# Patient Record
Sex: Male | Born: 1940 | Race: White | Hispanic: No | Marital: Married | Smoking: Never smoker
Health system: Southern US, Community
[De-identification: ages and names within clinical notes are randomized; demographics above are authoritative.]

## PROBLEM LIST (undated history)

## (undated) DIAGNOSIS — I10 Essential (primary) hypertension: Secondary | ICD-10-CM

## (undated) DIAGNOSIS — E119 Type 2 diabetes mellitus without complications: Secondary | ICD-10-CM

## (undated) DIAGNOSIS — F0390 Unspecified dementia without behavioral disturbance: Secondary | ICD-10-CM

## (undated) DIAGNOSIS — R413 Other amnesia: Secondary | ICD-10-CM

## (undated) DIAGNOSIS — C801 Malignant (primary) neoplasm, unspecified: Secondary | ICD-10-CM

## (undated) HISTORY — PX: HERNIA REPAIR: SHX51

## (undated) HISTORY — PX: BACK SURGERY: SHX140

---

## 2006-07-04 ENCOUNTER — Encounter: Admission: RE | Admit: 2006-07-04 | Discharge: 2006-07-04 | Payer: Self-pay | Admitting: Internal Medicine

## 2006-08-22 ENCOUNTER — Inpatient Hospital Stay (HOSPITAL_COMMUNITY): Admission: RE | Admit: 2006-08-22 | Discharge: 2006-08-23 | Payer: Self-pay | Admitting: Neurosurgery

## 2009-09-20 ENCOUNTER — Emergency Department (HOSPITAL_COMMUNITY): Admission: EM | Admit: 2009-09-20 | Discharge: 2009-09-20 | Payer: Self-pay | Admitting: Emergency Medicine

## 2010-07-12 NOTE — Op Note (Signed)
Patrick Phillips, FIRKUS NO.:  [id number]   MEDICAL RECORD NO.:  [id number]          PATIENT TYPE:  INP   LOCATION:  3172                         FACILITY:  [facility]   PHYSICIAN:  Hilda Lias, M.D.   DATE OF BIRTH:  [birth year]   DATE OF PROCEDURE:  08/22/2006  DATE OF DISCHARGE:                               OPERATIVE REPORT   PREOPERATIVE DIAGNOSIS:  Right L4-L5 and L5-S1 stenosis, herniated disc.   POSTOPERATIVE DIAGNOSES:  Right L4-L5 and L5-S1 stenosis, herniated  disc.   PROCEDURE:  Right L5 hemilaminectomy, right L5-S1 discectomy, right L4-  L5 foraminotomy, microscope.   SURGEON:  Hilda Lias, M.D.   ASSISTANT:  Coletta Memos, M.D.   CLINICAL HISTORY:  The patient was admitted because of back and right  leg.  The pain is getting weaker.  He cannot sleep.  X-rays show  herniated disc at the level of L5-S1 with stenosis at L4-L5.  The  patient wanted to proceed with surgery.   DESCRIPTION OF PROCEDURE:  The patient was taken to the OR and he was  positioned in a prone manner.  The back was cleaned with DuraPrep.  A  midline incision from L4 to L5 was made and the muscle retracted  laterally. We identified easily the L5-S1 and the L4-L5 space.  X-ray  was taken and showed, indeed, we were at the right area. From then on,  because the lamina was quite vertical, we used a Kerrison punch and we  did a hemilaminectomy.  We went to the L4-L5 space and the disc was  bulging but there was no herniated disc.  There was quite a bit of  stenosis in the foramen and foraminotomy to decompress the L5 nerve root  was done.  At the level of L5-S1 we found stenosis, but the patient has  a herniated disc right at the axilla of the nerve.  Retraction was made  and we went to the disc space with removal of two large fragments.  Total gross discectomy was achieved.  At the end, we proceed with  foraminotomy.  The area was irrigated.  Valsalva maneuver was negative.  Depo-Medrol and fentanyl were left in the epidural space and the wound  was closed with Vicryl and Steri-Strips.           ______________________________  Hilda Lias, M.D.     EB/MEDQ  D:  08/22/2006  T:  08/22/2006  Job:  [id number]

## 2010-12-14 LAB — BASIC METABOLIC PANEL
BUN: 17
CO2: 27
Chloride: 106
Glucose, Bld: 123 — ABNORMAL HIGH
Potassium: 4.4

## 2010-12-14 LAB — CBC
HCT: 42.2
MCV: 85.4
Platelets: 240
WBC: 10.8 — ABNORMAL HIGH

## 2013-08-04 ENCOUNTER — Emergency Department (HOSPITAL_COMMUNITY)
Admission: EM | Admit: 2013-08-04 | Discharge: 2013-08-04 | Disposition: A | Discharge status: Home / Self Care | Payer: Medicare Other | Attending: Emergency Medicine | Admitting: Emergency Medicine

## 2013-08-04 ENCOUNTER — Encounter (HOSPITAL_COMMUNITY): Payer: Self-pay | Admitting: Emergency Medicine

## 2013-08-04 DIAGNOSIS — Z85828 Personal history of other malignant neoplasm of skin: Secondary | ICD-10-CM | POA: Diagnosis not present

## 2013-08-04 DIAGNOSIS — R21 Rash and other nonspecific skin eruption: Secondary | ICD-10-CM | POA: Diagnosis present

## 2013-08-04 DIAGNOSIS — IMO0002 Reserved for concepts with insufficient information to code with codable children: Secondary | ICD-10-CM | POA: Diagnosis not present

## 2013-08-04 DIAGNOSIS — S30860A Insect bite (nonvenomous) of lower back and pelvis, initial encounter: Secondary | ICD-10-CM | POA: Diagnosis not present

## 2013-08-04 DIAGNOSIS — Y939 Activity, unspecified: Secondary | ICD-10-CM | POA: Diagnosis not present

## 2013-08-04 DIAGNOSIS — Z9889 Other specified postprocedural states: Secondary | ICD-10-CM | POA: Insufficient documentation

## 2013-08-04 DIAGNOSIS — W57XXXA Bitten or stung by nonvenomous insect and other nonvenomous arthropods, initial encounter: Secondary | ICD-10-CM

## 2013-08-04 DIAGNOSIS — Z792 Long term (current) use of antibiotics: Secondary | ICD-10-CM | POA: Diagnosis not present

## 2013-08-04 DIAGNOSIS — Y929 Unspecified place or not applicable: Secondary | ICD-10-CM | POA: Diagnosis not present

## 2013-08-04 DIAGNOSIS — Z79899 Other long term (current) drug therapy: Secondary | ICD-10-CM | POA: Diagnosis not present

## 2013-08-04 DIAGNOSIS — I10 Essential (primary) hypertension: Secondary | ICD-10-CM | POA: Insufficient documentation

## 2013-08-04 DIAGNOSIS — E119 Type 2 diabetes mellitus without complications: Secondary | ICD-10-CM | POA: Insufficient documentation

## 2013-08-04 HISTORY — DX: Essential (primary) hypertension: I10

## 2013-08-04 HISTORY — DX: Type 2 diabetes mellitus without complications: E11.9

## 2013-08-04 HISTORY — DX: Malignant (primary) neoplasm, unspecified: C80.1

## 2013-08-04 MED ORDER — PREDNISONE 20 MG PO TABS: ORAL_TABLET | ORAL | Status: DC

## 2013-08-04 MED ORDER — DIPHENHYDRAMINE HCL 25 MG PO CAPS
25.0000 mg | ORAL_CAPSULE | Freq: Once | ORAL | Status: AC
  Administered 2013-08-04: 25 mg via ORAL
  Filled 2013-08-04: qty 1

## 2013-08-04 MED ORDER — PREDNISONE 20 MG PO TABS
40.0000 mg | ORAL_TABLET | Freq: Once | ORAL | Status: AC
  Administered 2013-08-04: 40 mg via ORAL
  Filled 2013-08-04: qty 2

## 2013-08-04 MED ORDER — DOXYCYCLINE HYCLATE 100 MG PO CAPS: 100.0000 mg | ORAL_CAPSULE | Freq: Two times a day (BID) | ORAL | Status: DC

## 2013-08-04 NOTE — ED Notes (Signed)
Pt presents to the ED w/ multiple bites to his chest, arm & back. Pt denies ant new food, detergents or clothing. Pt also has swelling to the left axilla also.

## 2013-08-04 NOTE — ED Notes (Signed)
Pt alert & oriented x4, stable gait. Patient given discharge instructions, paperwork & prescription(s). Patient  instructed to stop at the registration desk to finish any additional paperwork. Patient verbalized understanding. Pt left department w/ no further questions. 

## 2013-08-04 NOTE — ED Notes (Signed)
Rash to lt upper chest and lt upper back, itches. Swollen lt axilla

## 2013-08-04 NOTE — ED Provider Notes (Addendum)
73 year old male had a tick bite several days ago. It was removed by a family member but was clearly attached. He has had it she bites in his upper back over the same period of time and has broken out in a rash in the anterior chest. It is moderately paretic. Exam shows definite arthropod bites on his upper back. Erythematous rashes across the anterior chest which is nonspecific in appearance. He will be given prophylaxis for possible [facility] spotted fever, although clearly the rash that he has now is not [facility] spotted fever.  Medical screening examination/treatment/procedure(s) were conducted as a shared visit with non-physician practitioner(s) and myself.  I personally evaluated the patient during the encounter.    Delora Fuel, MD 38/25/05 [id number]  Dejuan Elman, MD 73/41/93 [id number]

## 2013-08-04 NOTE — Discharge Instructions (Signed)
Insect Bite Mosquitoes, flies, fleas, bedbugs, and other insects can bite. Insect bites are different from insect stings. The bite may be red, puffy (swollen), and itchy for 2 to 4 days. Most bites get better on their own. HOME CARE   Do not scratch the bite.  Keep the bite clean and dry. Wash the bite with soap and water.  Put ice on the bite.  Put ice in a plastic bag.  Place a towel between your skin and the bag.  Leave the ice on for 20 minutes, 4 times a day. Do this for the first 2 to 3 days, or as told by your doctor.  You may use medicated lotions or creams to lessen itching as told by your doctor.  Only take medicines as told by your doctor.  If you are given medicines (antibiotics), take them as told. Finish them even if you start to feel better. You may need a tetanus shot if:  You cannot remember when you had your last tetanus shot.  You have never had a tetanus shot.  The injury broke your skin. If you need a tetanus shot and you choose not to have one, you may get tetanus. Sickness from tetanus can be serious. GET HELP RIGHT AWAY IF:   You have more pain, redness, or puffiness.  You see a red line on the skin coming from the bite.  You have a fever.  You have joint pain.  You have a headache or neck pain.  You feel weak.  You have a rash.  You have chest pain, or you are short of breath.  You have belly (abdominal) pain.  You feel sick to your stomach (nauseous) or throw up (vomit).  You feel very tired or sleepy. MAKE SURE YOU:   Understand these instructions.  Will watch your condition.  Will get help right away if you are not doing well or get worse. Document Released: 02/11/2000 Document Revised: 05/08/2011 Document Reviewed: 09/14/2010 [facility] Patient Information 2014 [place], [place].  Tick Bite Information Ticks are insects that attach themselves to the skin and draw blood for food. There are various types of ticks. Common types  include wood ticks and deer ticks. Most ticks live in shrubs and grassy areas. Ticks can climb onto your body when you make contact with leaves or grass where the tick is waiting. The most common places on the body for ticks to attach themselves are the scalp, neck, armpits, waist, and groin. Most tick bites are harmless, but sometimes ticks carry germs that cause diseases. These germs can be spread to a person during the tick's feeding process. The chance of a disease spreading through a tick bite depends on:   The type of tick.  Time of year.   How long the tick is attached.   Geographic location.  HOW CAN YOU PREVENT TICK BITES? Take these steps to help prevent tick bites when you are outdoors:  Wear protective clothing. Long sleeves and long pants are best.   Wear white clothes so you can see ticks more easily.  Tuck your pant legs into your socks.   If walking on a trail, stay in the middle of the trail to avoid brushing against bushes.  Avoid walking through areas with long grass.  Put insect repellent on all exposed skin and along boot tops, pant legs, and sleeve cuffs.   Check clothing, hair, and skin repeatedly and before going inside.   Brush off any ticks that are not  attached.  Take a shower or bath as soon as possible after being outdoors.  WHAT IS THE PROPER WAY TO REMOVE A TICK? Ticks should be removed as soon as possible to help prevent diseases caused by tick bites. 1. If latex gloves are available, put them on before trying to remove a tick.  2. Using fine-point tweezers, grasp the tick as close to the skin as possible. You may also use curved forceps or a tick removal tool. Grasp the tick as close to its head as possible. Avoid grasping the tick on its body. 3. Pull gently with steady upward pressure until the tick lets go. Do not twist the tick or jerk it suddenly. This may break off the tick's head or mouth parts. 4. Do not squeeze or crush the  tick's body. This could force disease-carrying fluids from the tick into your body.  5. After the tick is removed, wash the bite area and your hands with soap and water or other disinfectant such as alcohol. 6. Apply a small amount of antiseptic cream or ointment to the bite site.  7. Wash and disinfect any instruments that were used.  Do not try to remove a tick by applying a hot match, petroleum jelly, or fingernail polish to the tick. These methods do not work and may increase the chances of disease being spread from the tick bite.  WHEN SHOULD YOU SEEK MEDICAL CARE? Contact your health care provider if you are unable to remove a tick from your skin or if a part of the tick breaks off and is stuck in the skin.  After a tick bite, you need to be aware of signs and symptoms that could be related to diseases spread by ticks. Contact your health care provider if you develop any of the following in the days or weeks after the tick bite:  Unexplained fever.  Rash. A circular rash that appears days or weeks after the tick bite may indicate the possibility of Lyme disease. The rash may resemble a target with a bull's-eye and may occur at a different part of your body than the tick bite.  Redness and swelling in the area of the tick bite.   Tender, swollen lymph glands.   Diarrhea.   Weight loss.   Cough.   Fatigue.   Muscle, joint, or bone pain.   Abdominal pain.   Headache.   Lethargy or a change in your level of consciousness.  Difficulty walking or moving your legs.   Numbness in the legs.   Paralysis.  Shortness of breath.   Confusion.   Repeated vomiting.  Document Released: 02/11/2000 Document Revised: 12/04/2012 Document Reviewed: 07/24/2012 [facility] Patient Information 2014 [place].

## 2013-08-07 ENCOUNTER — Encounter (HOSPITAL_COMMUNITY): Payer: Self-pay | Admitting: Emergency Medicine

## 2013-08-07 ENCOUNTER — Emergency Department (HOSPITAL_COMMUNITY)
Admission: EM | Admit: 2013-08-07 | Discharge: 2013-08-07 | Disposition: A | Discharge status: Home / Self Care | Payer: Medicare Other | Attending: Emergency Medicine | Admitting: Emergency Medicine

## 2013-08-07 DIAGNOSIS — Z79899 Other long term (current) drug therapy: Secondary | ICD-10-CM | POA: Diagnosis not present

## 2013-08-07 DIAGNOSIS — E119 Type 2 diabetes mellitus without complications: Secondary | ICD-10-CM | POA: Insufficient documentation

## 2013-08-07 DIAGNOSIS — Z792 Long term (current) use of antibiotics: Secondary | ICD-10-CM | POA: Insufficient documentation

## 2013-08-07 DIAGNOSIS — I10 Essential (primary) hypertension: Secondary | ICD-10-CM | POA: Insufficient documentation

## 2013-08-07 DIAGNOSIS — Z4800 Encounter for change or removal of nonsurgical wound dressing: Secondary | ICD-10-CM | POA: Diagnosis present

## 2013-08-07 DIAGNOSIS — Z85828 Personal history of other malignant neoplasm of skin: Secondary | ICD-10-CM | POA: Diagnosis not present

## 2013-08-07 DIAGNOSIS — B029 Zoster without complications: Secondary | ICD-10-CM | POA: Diagnosis not present

## 2013-08-07 DIAGNOSIS — IMO0002 Reserved for concepts with insufficient information to code with codable children: Secondary | ICD-10-CM | POA: Insufficient documentation

## 2013-08-07 LAB — CBG MONITORING, ED: GLUCOSE-CAPILLARY: 356 mg/dL — AB (ref 70–99)

## 2013-08-07 MED ORDER — OXYCODONE-ACETAMINOPHEN 7.5-325 MG PO TABS: 1.0000 | ORAL_TABLET | ORAL | Status: DC | PRN

## 2013-08-07 MED ORDER — ACYCLOVIR 800 MG PO TABS: 800.0000 mg | ORAL_TABLET | Freq: Every day | ORAL | Status: DC

## 2013-08-07 NOTE — ED Notes (Signed)
Pt states CBG at home 249. Current CBG is 356 in triage.

## 2013-08-07 NOTE — ED Notes (Signed)
MD at bedside. 

## 2013-08-07 NOTE — ED Notes (Signed)
Pt seen here 6/8 for insect bite to chest. States areas have gotten larger. Also states prednisone made his sugar increase to 500's and then stopped the prednisone and doxycycline. States he stopped taking them this morning and threw the medications away. Also states area to back with 4 to 5 bumps from tick removal.

## 2013-08-07 NOTE — ED Provider Notes (Signed)
CSN: [id number]     Arrival date & time 08/07/13  1749 History   First MD Initiated Contact with Patient 08/07/13 1808     Chief Complaint  Patient presents with  . Insect Bite  . Wound Check     (Consider location/radiation/quality/duration/timing/severity/associated sxs/prior Treatment) Patient is a 73 y.o. male presenting with wound check. The history is provided by the patient.  Wound Check This is a new problem. The current episode started in the past 7 days. The problem occurs constantly. The problem has been gradually worsening. Associated symptoms include a rash.   Patrick Phillips is a 73 y.o. male who presents to the ED here for follow up of tick bite from 08/04/2013. He states that he had tick bites to his back and his son took one off by using a knife to cut into the skin to be able to get all the tick out. He states that the rash on his chest has gotten worse and is very painful.  The tick bite is better but the rash around it is worse. The patient states the pain is so severe he did not sleep at all last night and any thing that touches the area causes pain. Also the prednisone he was taking caused his blood sugar to go up so he called the pharmacy today and they told him to stop it.   Past Medical History  Diagnosis Date  . Hypertension   . Diabetes mellitus without complication   . Cancer     skin cancer   Past Surgical History  Procedure Laterality Date  . Hernia repair    . Back surgery     No family history on file. History  Substance Use Topics  . Smoking status: Never Smoker   . Smokeless tobacco: Not on file  . Alcohol Use: No    Review of Systems  Skin: Positive for rash.      Allergies  Review of patient's allergies indicates no known allergies.  Home Medications   Prior to Admission medications   Medication Sig Start Date End Date Taking? Authorizing Provider  doxycycline (VIBRAMYCIN) 100 MG capsule Take 1 capsule (100 mg total) by mouth 2 (two)  times daily. For 14 days 08/04/13   Tammy L. Triplett, PA-C  enalapril (VASOTEC) 20 MG tablet Take 20 mg by mouth every morning.  07/31/13   Historical Provider, MD  metFORMIN (GLUCOPHAGE) 500 MG tablet Take 1,000 mg by mouth 2 (two) times daily. 07/31/13   Historical Provider, MD  predniSONE (DELTASONE) 20 MG tablet Two tabs po qd x 4 days 08/04/13   Tammy L. Triplett, PA-C   BP 132/62  Pulse 68  Temp(Src) 97.5 F (36.4 C) (Oral)  Resp 18  Wt 193 lb (87.544 kg)  SpO2 98% Physical Exam  Nursing note and vitals reviewed. Constitutional: He is oriented to person, place, and time. He appears well-developed and well-nourished. No distress.  HENT:  Head: Normocephalic.  Eyes: Conjunctivae and EOM are normal.  Neck: Normal range of motion. Neck supple.  Cardiovascular: Normal rate.   Pulmonary/Chest: Effort normal.      vesicular rash noted to left chest wall and left upper back. The rash is painful.  Musculoskeletal: Normal range of motion.  Neurological: He is alert and oriented to person, place, and time. No cranial nerve deficit.  Skin: Skin is warm and dry.  Psychiatric: He has a normal mood and affect. His behavior is normal.    ED Course  Procedures (including  critical care time)  Dr. Roderic Palau in to examine the patient and agrees with assessment and plan.  Labs Review Labs Reviewed  CBG MONITORING, ED - Abnormal; Notable for the following:    Glucose-Capillary 356 (*)    All other components within normal limits    MDM  73 y.o. male with rash to upper left chest and back consistent with Zoster. Will treat with Zovirax and pain medication. Stable for discharge. He will return for any problems. Healing wound from tick bite on back. Discussed with the patient and his family clinical findings and plan of care. All questioned fully answered. He will keep close check on his blood sugar.    Medication List    TAKE these medications       acyclovir 800 MG tablet  Commonly known as:   ZOVIRAX  Take 1 tablet (800 mg total) by mouth 5 (five) times daily.     oxyCODONE-acetaminophen 7.5-325 MG per tablet  Commonly known as:  PERCOCET  Take 1 tablet by mouth every 4 (four) hours as needed for pain.      ASK your doctor about these medications       doxycycline 100 MG capsule  Commonly known as:  VIBRAMYCIN  Take 1 capsule (100 mg total) by mouth 2 (two) times daily. For 14 days     enalapril 20 MG tablet  Commonly known as:  VASOTEC  Take 20 mg by mouth every morning.     metFORMIN 500 MG tablet  Commonly known as:  GLUCOPHAGE  Take 1,000 mg by mouth 2 (two) times daily.     predniSONE 20 MG tablet  Commonly known as:  DELTASONE  Two tabs po qd x 4 days           [facility], NP 08/07/13 2303

## 2013-08-07 NOTE — ED Notes (Signed)
Pt verbalized understanding to use caution and no driving within 4 hours of taking Percocet due to med causes drowsiness, pt verbalized understanding that percocet can cause constipation as well

## 2013-08-07 NOTE — Discharge Instructions (Signed)
Do not take the narcotic if you are driving as it will make you sleepy. Return as needed Shingles Shingles (herpes zoster) is an infection that is caused by the same virus that causes chickenpox (varicella). The infection causes a painful skin rash and fluid-filled blisters, which eventually break open, crust over, and heal. It may occur in any area of the body, but it usually affects only one side of the body or face. The pain of shingles usually lasts about 1 month. However, some people with shingles may develop long-term (chronic) pain in the affected area of the body. Shingles often occurs many years after the person had chickenpox. It is more common:  In people older than 50 years.  In people with weakened immune systems, such as those with HIV, AIDS, or cancer.  In people taking medicines that weaken the immune system, such as transplant medicines.  In people under great stress. CAUSES  Shingles is caused by the varicella zoster virus (VZV), which also causes chickenpox. After a person is infected with the virus, it can remain in the person's body for years in an inactive state (dormant). To cause shingles, the virus reactivates and breaks out as an infection in a nerve root. The virus can be spread from person to person (contagious) through contact with open blisters of the shingles rash. It will only spread to people who have not had chickenpox. When these people are exposed to the virus, they may develop chickenpox. They will not develop shingles. Once the blisters scab over, the person is no longer contagious and cannot spread the virus to others. SYMPTOMS  Shingles shows up in stages. The initial symptoms may be pain, itching, and tingling in an area of the skin. This pain is usually described as burning, stabbing, or throbbing.In a few days or weeks, a painful red rash will appear in the area where the pain, itching, and tingling were felt. The rash is usually on one side of the body in  a band or belt-like pattern. Then, the rash usually turns into fluid-filled blisters. They will scab over and dry up in approximately 2 3 weeks. Flu-like symptoms may also occur with the initial symptoms, the rash, or the blisters. These may include:  Fever.  Chills.  Headache.  Upset stomach. DIAGNOSIS  Your caregiver will perform a skin exam to diagnose shingles. Skin scrapings or fluid samples may also be taken from the blisters. This sample will be examined under a microscope or sent to a lab for further testing. TREATMENT  There is no specific cure for shingles. Your caregiver will likely prescribe medicines to help you manage the pain, recover faster, and avoid long-term problems. This may include antiviral drugs, anti-inflammatory drugs, and pain medicines. HOME CARE INSTRUCTIONS   Take a cool bath or apply cool compresses to the area of the rash or blisters as directed. This may help with the pain and itching.   Only take over-the-counter or prescription medicines as directed by your caregiver.   Rest as directed by your caregiver.  Keep your rash and blisters clean with mild soap and cool water or as directed by your caregiver.  Do not pick your blisters or scratch your rash. Apply an anti-itch cream or numbing creams to the affected area as directed by your caregiver.  Keep your shingles rash covered with a loose bandage (dressing).  Avoid skin contact with:  Babies.   Pregnant women.   Children with eczema.   Elderly people with transplants.  People with chronic illnesses, such as leukemia or AIDS.   Wear loose-fitting clothing to help ease the pain of material rubbing against the rash.  Keep all follow-up appointments with your caregiver.If the area involved is on your face, you may receive a referral for follow-up to a specialist, such as an eye doctor (ophthalmologist) or an ear, nose, and throat (ENT) doctor. Keeping all follow-up appointments will  help you avoid eye complications, chronic pain, or disability.  SEEK IMMEDIATE MEDICAL CARE IF:   You have facial pain, pain around the eye area, or loss of feeling on one side of your face.  You have ear pain or ringing in your ear.  You have loss of taste.  Your pain is not relieved with prescribed medicines.   Your redness or swelling spreads.   You have more pain and swelling.  Your condition is worsening or has changed.   You have a feveror persistent symptoms for more than 2 3 days.  You have a fever and your symptoms suddenly get worse. MAKE SURE YOU:  Understand these instructions.  Will watch your condition.  Will get help right away if you are not doing well or get worse. Document Released: 02/13/2005 Document Revised: 11/08/2011 Document Reviewed: 09/28/2011 [facility] Patient Information 2014 [place].

## 2013-08-11 NOTE — ED Provider Notes (Signed)
Medical screening examination/treatment/procedure(s) were performed by non-physician practitioner and as supervising physician I was immediately available for consultation/collaboration.   EKG Interpretation None        Carvin Almas L Cleon Thoma, MD 08/11/13 0802 

## 2013-08-19 NOTE — ED Provider Notes (Signed)
CSN: [id number]     Arrival date & time 08/04/13  2127 History   First MD Initiated Contact with Patient 08/04/13 2310     Chief Complaint  Patient presents with  . Rash     (Consider location/radiation/quality/duration/timing/severity/associated sxs/prior Treatment) Patient is a 73 y.o. male presenting with rash. The history is provided by the patient.  Rash Location:  Torso Torso rash location:  Upper back and L chest Quality: itchiness and redness   Quality: not blistering, not draining, not painful, not swelling and not weeping   Severity:  Moderate Onset quality:  Gradual Duration:  3 days Timing:  Constant Chronicity:  New Context comment:  Tick bite Relieved by:  Nothing Worsened by:  Nothing tried Ineffective treatments:  None tried Associated symptoms: no fatigue, no fever, no headaches, no joint pain, no myalgias, no nausea, no periorbital edema, no shortness of breath, no sore throat, no throat swelling, no tongue swelling, not vomiting and not wheezing    Patient states his son removed a tick from his left upper back several days ago.  He states the tick was embedded in the skin and was removed by scraping the skin with a knife.  Pt reports that the tick was completely removed.  He noticed a itchy red rash begin to develop surrounding the tick bite and extending to the left upper chest.  He denies pain, swelling, joint pains, fever, headaches or vomiting.  Nothing makes the symptoms better or worse.     Past Medical History  Diagnosis Date  . Hypertension   . Diabetes mellitus without complication   . Cancer     skin cancer   Past Surgical History  Procedure Laterality Date  . Hernia repair    . Back surgery     History reviewed. No pertinent family history. History  Substance Use Topics  . Smoking status: Never Smoker   . Smokeless tobacco: Not on file  . Alcohol Use: No    Review of Systems  Constitutional: Negative for fever, chills, activity change,  appetite change and fatigue.  HENT: Negative for facial swelling, sore throat and trouble swallowing.   Respiratory: Negative for chest tightness, shortness of breath and wheezing.   Gastrointestinal: Negative for nausea and vomiting.  Musculoskeletal: Negative for arthralgias, back pain, joint swelling, myalgias, neck pain and neck stiffness.  Skin: Positive for color change and rash. Negative for wound.  Neurological: Negative for dizziness, weakness, numbness and headaches.  All other systems reviewed and are negative.     Allergies  Review of patient's allergies indicates no known allergies.  Home Medications   Prior to Admission medications   Medication Sig Start Date End Date Taking? Authorizing Provider  enalapril (VASOTEC) 20 MG tablet Take 20 mg by mouth every morning.  07/31/13  Yes Historical Provider, MD  metFORMIN (GLUCOPHAGE) 500 MG tablet Take 1,000 mg by mouth 2 (two) times daily. 07/31/13  Yes Historical Provider, MD  acyclovir (ZOVIRAX) 800 MG tablet Take 1 tablet (800 mg total) by mouth 5 (five) times daily. 08/07/13   Hope Bunnie Pion, NP  doxycycline (VIBRAMYCIN) 100 MG capsule Take 1 capsule (100 mg total) by mouth 2 (two) times daily. For 14 days 08/04/13   Santana Gosdin L. Shawne Eskelson, PA-C  oxyCODONE-acetaminophen (PERCOCET) 7.5-325 MG per tablet Take 1 tablet by mouth every 4 (four) hours as needed for pain. 08/07/13   Hope Bunnie Pion, NP  predniSONE (DELTASONE) 20 MG tablet Two tabs po qd x 4 days 08/04/13  Robley Matassa L. Kelsey Edman, PA-C   BP 165/80  Pulse 63  Temp(Src) 98.6 F (37 C) (Oral)  Resp 20  Ht 6' (1.829 m)  Wt 193 lb (87.544 kg)  BMI 26.17 kg/m2  SpO2 95% Physical Exam  Nursing note and vitals reviewed. Constitutional: He is oriented to person, place, and time. He appears well-developed and well-nourished. No distress.  HENT:  Head: Normocephalic and atraumatic.  Mouth/Throat: Oropharynx is clear and moist.  Neck: Normal range of motion. Neck supple.  Cardiovascular:  Normal rate, regular rhythm, normal heart sounds and intact distal pulses.   No murmur heard. Pulmonary/Chest: Effort normal and breath sounds normal. No respiratory distress. He exhibits no tenderness.  Musculoskeletal: Normal range of motion. He exhibits no edema and no tenderness.  Lymphadenopathy:    He has no cervical adenopathy.  Neurological: He is alert and oriented to person, place, and time. He exhibits normal muscle tone. Coordination normal.  Skin: Skin is warm. Rash noted. There is erythema.  Scattered erythematous papules to the left upper back at the reported site of tick bites.  No tick parts remain. No vesicles or lesions with central clearing.  Few smaller, nonspecific papules to the left upper chest.      ED Course  Procedures (including critical care time) Labs Review Labs Reviewed - No data to display  Imaging Review No results found.   EKG Interpretation None      MDM   Final diagnoses:  Insect bite  Tick bite    Pt is well appearing, non-toxic.  Hx of recent tick bites to the upper back and associated rash.  No reported fever, arthralgias, or myalgias.  Rash appears non specific.  Will treat pt with doxycycline and prednisone.  He agrees to return here if any worsening symptoms or f/u with PMD.    Pt also seen by Dr. Roxanne Mins and care plan discussed.  Pt appears stable for d/c and agrees to plan.    Costas Sena L. Vanessa Braceville, PA-C 08/19/13 1242

## 2013-08-19 NOTE — ED Provider Notes (Deleted)
Medical screening examination/treatment/procedure(s) were performed by non-physician practitioner and as supervising physician I was immediately available for consultation/collaboration.    Delora Fuel, MD 98/33/82 [id number]

## 2013-10-16 ENCOUNTER — Encounter (HOSPITAL_COMMUNITY): Payer: Self-pay | Admitting: Emergency Medicine

## 2013-10-16 ENCOUNTER — Observation Stay (HOSPITAL_COMMUNITY)
Admission: EM | Admit: 2013-10-16 | Discharge: 2013-10-17 | Disposition: A | Discharge status: Home / Self Care | Payer: Medicare Other | Attending: Emergency Medicine | Admitting: Emergency Medicine

## 2013-10-16 DIAGNOSIS — E119 Type 2 diabetes mellitus without complications: Secondary | ICD-10-CM | POA: Insufficient documentation

## 2013-10-16 DIAGNOSIS — R0602 Shortness of breath: Secondary | ICD-10-CM | POA: Diagnosis not present

## 2013-10-16 DIAGNOSIS — Z79899 Other long term (current) drug therapy: Secondary | ICD-10-CM | POA: Diagnosis not present

## 2013-10-16 DIAGNOSIS — R55 Syncope and collapse: Principal | ICD-10-CM | POA: Insufficient documentation

## 2013-10-16 DIAGNOSIS — R42 Dizziness and giddiness: Secondary | ICD-10-CM | POA: Diagnosis not present

## 2013-10-16 DIAGNOSIS — R404 Transient alteration of awareness: Secondary | ICD-10-CM | POA: Diagnosis present

## 2013-10-16 DIAGNOSIS — Z85828 Personal history of other malignant neoplasm of skin: Secondary | ICD-10-CM | POA: Insufficient documentation

## 2013-10-16 DIAGNOSIS — I498 Other specified cardiac arrhythmias: Secondary | ICD-10-CM | POA: Insufficient documentation

## 2013-10-16 DIAGNOSIS — I10 Essential (primary) hypertension: Secondary | ICD-10-CM | POA: Diagnosis not present

## 2013-10-16 DIAGNOSIS — R110 Nausea: Secondary | ICD-10-CM | POA: Insufficient documentation

## 2013-10-16 DIAGNOSIS — R109 Unspecified abdominal pain: Secondary | ICD-10-CM | POA: Diagnosis not present

## 2013-10-16 DIAGNOSIS — R5383 Other fatigue: Secondary | ICD-10-CM

## 2013-10-16 DIAGNOSIS — M2560 Stiffness of unspecified joint, not elsewhere classified: Secondary | ICD-10-CM

## 2013-10-16 DIAGNOSIS — R5381 Other malaise: Secondary | ICD-10-CM | POA: Diagnosis not present

## 2013-10-16 DIAGNOSIS — M2569 Stiffness of other specified joint, not elsewhere classified: Secondary | ICD-10-CM | POA: Insufficient documentation

## 2013-10-16 DIAGNOSIS — R001 Bradycardia, unspecified: Secondary | ICD-10-CM

## 2013-10-16 NOTE — ED Notes (Signed)
Pt c/o sob with abd tightness and dizziness. Pt states he had syncopal episode at home in bathroom.

## 2013-10-17 ENCOUNTER — Emergency Department (HOSPITAL_COMMUNITY): Payer: Medicare Other

## 2013-10-17 ENCOUNTER — Encounter (HOSPITAL_COMMUNITY): Payer: Self-pay | Admitting: *Deleted

## 2013-10-17 DIAGNOSIS — R001 Bradycardia, unspecified: Secondary | ICD-10-CM | POA: Diagnosis present

## 2013-10-17 DIAGNOSIS — R55 Syncope and collapse: Secondary | ICD-10-CM | POA: Insufficient documentation

## 2013-10-17 DIAGNOSIS — I10 Essential (primary) hypertension: Secondary | ICD-10-CM

## 2013-10-17 DIAGNOSIS — E119 Type 2 diabetes mellitus without complications: Secondary | ICD-10-CM | POA: Diagnosis present

## 2013-10-17 DIAGNOSIS — R0602 Shortness of breath: Secondary | ICD-10-CM | POA: Diagnosis present

## 2013-10-17 DIAGNOSIS — I498 Other specified cardiac arrhythmias: Secondary | ICD-10-CM

## 2013-10-17 LAB — CBC
HCT: 37.2 % — ABNORMAL LOW (ref 39.0–52.0)
Hemoglobin: 12.6 g/dL — ABNORMAL LOW (ref 13.0–17.0)
MCH: 29.4 pg (ref 26.0–34.0)
MCHC: 33.9 g/dL (ref 30.0–36.0)
MCV: 86.7 fL (ref 78.0–100.0)
PLATELETS: 181 10*3/uL (ref 150–400)
RBC: 4.29 MIL/uL (ref 4.22–5.81)
RDW: 13.3 % (ref 11.5–15.5)
WBC: 9.5 10*3/uL (ref 4.0–10.5)

## 2013-10-17 LAB — CBC WITH DIFFERENTIAL/PLATELET
BASOS PCT: 0 % (ref 0–1)
Basophils Absolute: 0 10*3/uL (ref 0.0–0.1)
EOS ABS: 0.4 10*3/uL (ref 0.0–0.7)
EOS PCT: 4 % (ref 0–5)
HEMATOCRIT: 39.6 % (ref 39.0–52.0)
HEMOGLOBIN: 13.3 g/dL (ref 13.0–17.0)
Lymphocytes Relative: 44 % (ref 12–46)
Lymphs Abs: 4.4 10*3/uL — ABNORMAL HIGH (ref 0.7–4.0)
MCH: 29 pg (ref 26.0–34.0)
MCHC: 33.6 g/dL (ref 30.0–36.0)
MCV: 86.5 fL (ref 78.0–100.0)
MONO ABS: 0.6 10*3/uL (ref 0.1–1.0)
MONOS PCT: 6 % (ref 3–12)
NEUTROS PCT: 46 % (ref 43–77)
Neutro Abs: 4.6 10*3/uL (ref 1.7–7.7)
Platelets: 208 10*3/uL (ref 150–400)
RBC: 4.58 MIL/uL (ref 4.22–5.81)
RDW: 13.2 % (ref 11.5–15.5)
WBC: 10 10*3/uL (ref 4.0–10.5)

## 2013-10-17 LAB — BASIC METABOLIC PANEL
ANION GAP: 13 (ref 5–15)
Anion gap: 13 (ref 5–15)
BUN: 23 mg/dL (ref 6–23)
BUN: 22 mg/dL (ref 6–23)
CHLORIDE: 104 meq/L (ref 96–112)
CO2: 23 mEq/L (ref 19–32)
CO2: 23 meq/L (ref 19–32)
CREATININE: 1.02 mg/dL (ref 0.50–1.35)
Calcium: 9.7 mg/dL (ref 8.4–10.5)
Calcium: 9.2 mg/dL (ref 8.4–10.5)
Chloride: 104 mEq/L (ref 96–112)
Creatinine, Ser: 0.97 mg/dL (ref 0.50–1.35)
GFR calc non Af Amer: 71 mL/min — ABNORMAL LOW (ref >90)
GFR, EST AFRICAN AMERICAN: 83 mL/min — AB (ref >90)
GFR, EST NON AFRICAN AMERICAN: 81 mL/min — AB (ref >90)
GLUCOSE: 115 mg/dL — AB (ref 70–99)
Glucose, Bld: 147 mg/dL — ABNORMAL HIGH (ref 70–99)
POTASSIUM: 4.4 meq/L (ref 3.7–5.3)
Potassium: 4.2 mEq/L (ref 3.7–5.3)
SODIUM: 140 meq/L (ref 137–147)
Sodium: 140 mEq/L (ref 137–147)

## 2013-10-17 LAB — CBG MONITORING, ED: Glucose-Capillary: 115 mg/dL — ABNORMAL HIGH (ref 70–99)

## 2013-10-17 LAB — I-STAT TROPONIN, ED: Troponin i, poc: 0 ng/mL (ref 0.00–0.08)

## 2013-10-17 LAB — D-DIMER, QUANTITATIVE: D-Dimer, Quant: 0.61 ug/mL-FEU — ABNORMAL HIGH (ref 0.00–0.48)

## 2013-10-17 LAB — TROPONIN I: Troponin I: <0.3 ng/mL (ref <0.30)

## 2013-10-17 LAB — GLUCOSE, CAPILLARY: Glucose-Capillary: 125 mg/dL — ABNORMAL HIGH (ref 70–99)

## 2013-10-17 MED ORDER — SODIUM CHLORIDE 0.9 % IJ SOLN
3.0000 mL | INTRAMUSCULAR | Status: DC | PRN
  Administered 2013-10-17: 3 mL via INTRAVENOUS

## 2013-10-17 MED ORDER — SODIUM CHLORIDE 0.9 % IJ SOLN: 3.0000 mL | Freq: Two times a day (BID) | INTRAMUSCULAR | Status: DC

## 2013-10-17 MED ORDER — SODIUM CHLORIDE 0.9 % IV SOLN: 250.0000 mL | INTRAVENOUS | Status: DC | PRN

## 2013-10-17 MED ORDER — ACETAMINOPHEN 325 MG PO TABS
650.0000 mg | ORAL_TABLET | Freq: Four times a day (QID) | ORAL | Status: DC | PRN
  Administered 2013-10-17: 650 mg via ORAL
  Filled 2013-10-17: qty 2

## 2013-10-17 MED ORDER — LORAZEPAM 1 MG PO TABS
1.0000 mg | ORAL_TABLET | Freq: Once | ORAL | Status: AC
  Administered 2013-10-17: 1 mg via ORAL
  Filled 2013-10-17: qty 1

## 2013-10-17 NOTE — Discharge Summary (Signed)
Physician Discharge Summary  Dearborn [id number] DOB: 11/21/1940 DOA: 10/16/2013  PCP: Cleda Mccreedy, MD  Admit date: 10/16/2013 Discharge date: 10/17/2013  Recommendations for Outpatient Follow-up:  1. Shortness of breath. See discussion below.   Follow-up Information   Follow up with Cleda Mccreedy, MD. Schedule an appointment as soon as possible for a visit in 1 week.   Specialty:  Internal Medicine   Contact information:   [address] [address] [phone]      Discharge Diagnoses:  1. Shortness of breath, resolved. 2. Diabetes mellitus type 2. 3. Hypertension.  Discharge Condition: Improved Disposition: Home  Diet recommendation: Heart healthy diabetic diet  Filed Weights   10/16/13 2356 10/17/13 0240  Weight: 87.544 kg (193 lb) 87.544 kg (193 lb)    History of present illness:  73 year old woman with history of diabetes, hypertension who developed shortness of breath 8/20 in the evening after eating a large meal and watermelon. He believes his shortness of breath was secondary to distention of his abdomen and improved with compression of his abdomen with his arms. Shortness of breath resolved after presenting to the emergency department. Although syncope documented by EDP, the patient reports no syncope. He got up in the middle of the night, felt generally weak and needed to sit down but never passed out. He was admitted for further evaluation of shortness of breath.  Hospital Course:  Mr. Poole had no recurrent shortness of breath. Troponin was negative, telemetry unremarkable. No signs or symptoms to suggest ACS or VTE. Hospitalization was uncomplicated and he is back to baseline and desires discharge home. No further evaluation is suggested at this time. Individual issues as below.  1. Shortness of breath. Resolved. He does have a history of bronchitis but was never a smoker. Chest x-ray with no acute abnormalities, respiratory rate normal,  no significant hypoxia. No history to suggest, or evidence of, ACS. No history or clinical evidence to suggest CHF. He has no risk factors or history of VTE, Wells score = 0, he never had chest pain. I agree with EDP documentation that PE/VTE is not suspected. I discussed positive ddimer with him but recommend no further evaluation at this point. I concur with patient's assessment this is likely related to abdominal distention after overeating. Mild sinus bradycardia with no significant arrhythmias noted. 2. DM. Stable. 3. HTN. Stable. 4. Grief, anxiety for wife who died unexpectedly 2013-03-05  Consultants:  none Procedures:  none  Discharge Instructions  Discharge Instructions   Activity as tolerated - No restrictions    Complete by:  As directed      Diet - low sodium heart healthy    Complete by:  As directed      Diet Carb Modified    Complete by:  As directed      Discharge instructions    Complete by:  As directed   Call physician or seek immediate medical assistance for recurrent shortness of breath, chest pain, generalized weakness, difficulty walking or worsening of condition.            Medication List         acyclovir 800 MG tablet  Commonly known as:  ZOVIRAX  Take 1 tablet (800 mg total) by mouth 5 (five) times daily.     doxycycline 100 MG capsule  Commonly known as:  VIBRAMYCIN  Take 1 capsule (100 mg total) by mouth 2 (two) times daily. For 14 days     enalapril 20 MG  tablet  Commonly known as:  VASOTEC  Take 20 mg by mouth every morning.     metFORMIN 500 MG tablet  Commonly known as:  GLUCOPHAGE  Take 1,000 mg by mouth 2 (two) times daily.     oxyCODONE-acetaminophen 7.5-325 MG per tablet  Commonly known as:  PERCOCET  Take 1 tablet by mouth every 4 (four) hours as needed for pain.     predniSONE 20 MG tablet  Commonly known as:  DELTASONE  Two tabs po qd x 4 days       No Known Allergies  The results of significant diagnostics from this  hospitalization (including imaging, microbiology, ancillary and laboratory) are listed below for reference.    Significant Diagnostic Studies: Dg Chest Portable 1 View  10/17/2013   CLINICAL DATA:  Shortness of breath, hypertension.  EXAM: PORTABLE CHEST - 1 VIEW  COMPARISON:  Chest radiograph August 21, 2006  FINDINGS: Cardiomediastinal silhouette is unremarkable. The lungs are clear without pleural effusions or focal consolidations. Trachea projects midline and there is no pneumothorax. Soft tissue planes and included osseous structures are non-suspicious.  IMPRESSION: No acute cardiopulmonary process.   Electronically Signed   By: Elon Alas   On: 10/17/2013 00:33   Labs: Basic Metabolic Panel:  Recent Labs Lab 10/17/13 0038 10/17/13 0348  NA 140 140  K 4.4 4.2  CL 104 104  CO2 23 23  GLUCOSE 115* 147*  BUN 23 22  CREATININE 1.02 0.97  CALCIUM 9.7 9.2   CBC:  Recent Labs Lab 10/17/13 0038 10/17/13 0348  WBC 10.0 9.5  NEUTROABS 4.6  --   HGB 13.3 12.6*  HCT 39.6 37.2*  MCV 86.5 86.7  PLT 208 181   Cardiac Enzymes:  Recent Labs Lab 10/17/13 0348  TROPONINI <0.30   CBG:  Recent Labs Lab 10/17/13 0023 10/17/13 0316  GLUCAP 115* 125*    Principal Problem:   SOB (shortness of breath) Active Problems:   Hypertension   Diabetes mellitus without complication   Sinus bradycardia   Time coordinating discharge: 35 minutes  Signed:  Murray Hodgkins, MD [facility] 10/17/2013, 9:47 AM

## 2013-10-17 NOTE — Progress Notes (Signed)
PROGRESS NOTE  Patrick Phillips [id number] DOB: 1940/02/28 DOA: 10/16/2013 PCP: Cleda Mccreedy, MD  Summary: 73 year old woman with history of diabetes, hypertension who developed shortness of breath 8/20 in the evening after eating a large meal and watermelon. He believes his shortness of breath was secondary to distention of his abdomen and improved with compression of his abdomen with his arms. Shortness of breath resolved after presenting to the emergency department. Although syncope documented by EDP, the patient reports no syncope. He got up in the middle of the night, felt generally weak and needed to sit down but never passed out. He was admitted for further evaluation of shortness of breath.  Assessment/Plan: 1. Shortness of breath. Resolved. He does have a history of bronchitis but was never a smoker. Chest x-ray with no acute abnormalities, respiratory rate normal, no significant hypoxia. No history to suggest, or evidence of, ACS. No history or clinical evidence to suggest CHF. He has no risk factors or history of VTE, Wells score = 0, he never had chest pain. I agree with EDP documentation that PE/VTE is not suspected. I discussed positive ddimer with him but recommend no further evaluation at this point. I concur with patient's assessment this is likely related to abdominal distention after overeating. Mild sinus bradycardia with no significant arrhythmias noted. 2. DM. Stable. 3. HTN. Stable. 4. Grief, anxiety for wife who died unexpectedly March 29, 2013   Shortness of breath has resolved. No further evaluation suggested at this time. Plan discharge home today.  Discussed in detail with 2 daughters, 2 granddaughters at bedside. I reviewed all testing with them and recommendations.  Murray Hodgkins, MD  [facility]  Pager [phone] If 7PM-7AM, please contact night-coverage at [email or web address], password [facility] 10/17/2013, 8:59 AM  LOS: 1 day    Consultants:  none  Procedures:  none  Antibiotics:    HPI/Subjective: Feels good, no complaints. Breathing normally. No chest pain. No recent surgery, swelling. He reports upon further reflection he thinks this was secondary to over heating. He developed some shortness of breath after eating a large meal and watermelon last evening prior to going to sleep. He does have intermittent episodes of shortness of breath. He again reports no syncope at home.  Objective: Filed Vitals:   10/17/13 0240 10/17/13 0244 10/17/13 0559 10/17/13 0738  BP:  142/68    Pulse:  50    Temp:  98.7 F (37.1 C) 98 F (36.7 C)   TempSrc:  Oral Oral   Resp:  20 20   Height: 6' (1.829 m)     Weight: 87.544 kg (193 lb)     SpO2:   100% 100%   No intake or output data in the 24 hours ending 10/17/13 0859   Filed Weights   10/16/13 2356 10/17/13 0240  Weight: 87.544 kg (193 lb) 87.544 kg (193 lb)    Exam:     Afebrile, vitals stable. Heart rate 50-60s. Normotensive. Gen. Appears calm and comfortable.  Psych. Alert. Speech fluent and clear.  Eyes. Grossly unremarkable  Cardiovascular. Regular rate and rhythm. No murmur, rub or gallop. No lower extremity edema. Telemetry sinus rhythm, sinus bradycardia. Heart rate 50 to 60s.  Respiratory. Clear to auscultation bilaterally. No wheezes, rales or rhonchi. Normal respiratory effort.  Skin. Limited exam grossly unremarkable  Musculoskeletal. Moves all extremities  Data Reviewed:  Basic metabolic panel unremarkable. Troponin negative.  CBC unremarkable.  D-dimer modestly elevated.  Chest x-ray unremarkable. EKG sinus bradycardia. No acute changes.  Scheduled Meds: . sodium  chloride  3 mL Intravenous Q12H  . sodium chloride  3 mL Intravenous Q12H   Continuous Infusions:   Principal Problem:   SOB (shortness of breath) Active Problems:   Hypertension   Diabetes mellitus without complication   Sinus  bradycardia

## 2013-10-17 NOTE — ED Provider Notes (Signed)
CSN: [id number]     Arrival date & time 10/16/13  2347 History   First MD Initiated Contact with Patient 10/17/13 0001    This chart was scribed for Sharyon Cable, MD by Terressa Koyanagi, ED Scribe. This patient was seen in room APA14/AP[facility]14 and the patient's care was started at 12:05 AM.  Chief Complaint  Patient presents with  . Loss of Consciousness   The history is provided by the patient. No language interpreter was used.   PCP: Cleda Mccreedy, MD  HPI Comments: Patrick Phillips is a 73 y.o. male who presents to the Emergency Department complaining of syncopal episode at home this evening. Pt suspects the episode lasted approximately 25 minutes. Pt states that he "could not breathe" prior to loss of consciousness and his neck became stiff. Pt denies any head trauma. Pt denies any Hx of similar episode. Pt also complains of associated abd tightness, weakness in the lower extremities, and dizziness. Pt notes that he has been in bed most of the day today as he has not been feeling well. .Pt denies any severe HAs today, chest pain, LE swelling.    Past Medical History  Diagnosis Date  . Hypertension   . Diabetes mellitus without complication   . Cancer     skin cancer   Past Surgical History  Procedure Laterality Date  . Hernia repair    . Back surgery     History reviewed. No pertinent family history. History  Substance Use Topics  . Smoking status: Never Smoker   . Smokeless tobacco: Not on file  . Alcohol Use: Yes    Review of Systems  Constitutional: Negative for fever and chills.  HENT:       Neck stiffness  Respiratory: Positive for shortness of breath.   Gastrointestinal: Positive for nausea and abdominal pain (abd tightness).  Neurological: Positive for dizziness, syncope and weakness (lower extremities ).  All other systems reviewed and are negative.  Allergies  Review of patient's allergies indicates no known allergies.  Home Medications   Prior to Admission  medications   Medication Sig Start Date End Date Taking? Authorizing Provider  enalapril (VASOTEC) 20 MG tablet Take 20 mg by mouth every morning.  07/31/13  Yes Historical Provider, MD  metFORMIN (GLUCOPHAGE) 500 MG tablet Take 1,000 mg by mouth 2 (two) times daily. 07/31/13  Yes Historical Provider, MD  acyclovir (ZOVIRAX) 800 MG tablet Take 1 tablet (800 mg total) by mouth 5 (five) times daily. 08/07/13   Hope Bunnie Pion, NP  doxycycline (VIBRAMYCIN) 100 MG capsule Take 1 capsule (100 mg total) by mouth 2 (two) times daily. For 14 days 08/04/13   Tammy L. Triplett, PA-C  oxyCODONE-acetaminophen (PERCOCET) 7.5-325 MG per tablet Take 1 tablet by mouth every 4 (four) hours as needed for pain. 08/07/13   Hope Bunnie Pion, NP  predniSONE (DELTASONE) 20 MG tablet Two tabs po qd x 4 days 08/04/13   Tammy L. Triplett, PA-C   Triage Vitals: BP 165/63  Pulse 56  Temp(Src) 98.2 F (36.8 C)  Resp 18  Ht 6' (1.829 m)  Wt 193 lb (87.544 kg)  BMI 26.17 kg/m2  SpO2 98% Physical Exam CONSTITUTIONAL: Well developed/well nourished HEAD: Normocephalic/atraumatic EYES: EOMI/PERRL ENMT: Mucous membranes moist, uvula midline no stridor.  NECK: supple no meningeal signs SPINE:entire spine nontender CV: S1/S2 noted, no murmurs/rubs/gallops noted LUNGS: Lungs are clear to auscultation bilaterally, no apparent distress ABDOMEN: soft, nontender, no rebound or guarding GU:no cva tenderness NEURO: Pt  is awake/alert, moves all extremitiesx4, no arm/leg drift noted EXTREMITIES: pulses normal, full ROM SKIN: warm, color normal PSYCH: no abnormalities of mood noted  ED Course  Procedures  DIAGNOSTIC STUDIES: Oxygen Saturation is 98% on RA, nl by my interpretation.    COORDINATION OF CARE: 12:12 AM-Discussed treatment plan which includes admitting pt to hospital, imaging, EKG, and labs with pt at bedside and pt agreed to plan.   2:22 AM Pt with syncopal episode at home, now back to baseline Denies CP  And no hypoxia, I  doubt PE at this time He has no signs of head injury and denies significant HA, defer CT head However given age/history of prolonged loss of consciousness, will admit D/w Dr Shanon Brow with [facility] will [facility]   Labs Review Labs Reviewed  BASIC METABOLIC PANEL - Abnormal; Notable for the following:    Glucose, Bld 115 (*)    GFR calc non Af Amer 71 (*)    GFR calc Af Amer 83 (*)    All other components within normal limits  CBC WITH DIFFERENTIAL - Abnormal; Notable for the following:    Lymphs Abs 4.4 (*)    All other components within normal limits  CBG MONITORING, ED - Abnormal; Notable for the following:    Glucose-Capillary 115 (*)    All other components within normal limits  I-STAT TROPOININ, ED    Imaging Review Dg Chest Portable 1 View  10/17/2013   CLINICAL DATA:  Shortness of breath, hypertension.  EXAM: PORTABLE CHEST - 1 VIEW  COMPARISON:  Chest radiograph August 21, 2006  FINDINGS: Cardiomediastinal silhouette is unremarkable. The lungs are clear without pleural effusions or focal consolidations. Trachea projects midline and there is no pneumothorax. Soft tissue planes and included osseous structures are non-suspicious.  IMPRESSION: No acute cardiopulmonary process.   Electronically Signed   By: Elon Alas   On: 10/17/2013 00:33     EKG Interpretation   Date/Time:  Friday October 17 2013 00:07:24 EDT Ventricular Rate:  52 PR Interval:  180 QRS Duration: 97 QT Interval:  438 QTC Calculation: 407 R Axis:   -13 Text Interpretation:  Sinus rhythm Abnormal R-wave progression, early  transition Confirmed by Christy Gentles  MD, Jlee Harkless ([id number]) on 10/17/2013 12:32:21  AM      MDM   Final diagnoses:  Syncope and collapse  Sinus bradycardia    Nursing notes including past medical history and social history reviewed and considered in documentation xrays reviewed and considered Labs/vital reviewed and considered  I personally performed the services described in this  documentation, which was scribed in my presence. The recorded information has been reviewed and is accurate.       Sharyon Cable, MD 10/17/13 [phone]

## 2013-10-17 NOTE — H&P (Signed)
PCP:   Cleda Mccreedy, MD   Chief Complaint:  sob  HPI: 73 yo male h/o htn, dm comes in tonight after experiencing sudden sob while laying down in bed tonight.  He was lying in bed for about 20 minutes when he got sob and felt like he couldn't breath.  He got up walked to his bathroom, by the time he got to the bathroom he felt weak and layed down on the floor.  He felt a little better but was still sob, after several minutes of lying on the floor he got up made it to the living room and called his children whom arrived and brought him to the ED.  Pt reports he never actually passed out.  He had no chest pain.  No recent fevers.  No n/v/d.  No le swelling or edema.  No recent traveling or surgery.  No pain anywhere.  Pt is nonsmoker, but his wife was a smoker.  No h/o asthma but has been on inhalers on/off for several years not on any now.  His sob was relieved soon after receiving ativan 1mg  in the ED.  We were asked to admit the patient for syncope evaluation.  After further questioning, patient reports (2 daughters are present also) that he has been having a very difficult time since the death of his wife of 31 years at the end of January of this year who passed unexpectedly.  He has not been sleeping well, and has been having problems with anxiety.  The worst time is at night when he is trying to sleep.  He got very anxious tonight when his sob got bad.  He has no history of panic attacks.  He seems depressed.  Denies suicidal thoughts.  Review of Systems:  Positive and negative as per HPI otherwise all other systems are negative  Past Medical History: Past Medical History  Diagnosis Date  . Hypertension   . Diabetes mellitus without complication   . Cancer     skin cancer   Past Surgical History  Procedure Laterality Date  . Hernia repair    . Back surgery      Medications: Prior to Admission medications   Medication Sig Start Date End Date Taking? Authorizing Provider   enalapril (VASOTEC) 20 MG tablet Take 20 mg by mouth every morning.  07/31/13  Yes Historical Provider, MD  metFORMIN (GLUCOPHAGE) 500 MG tablet Take 1,000 mg by mouth 2 (two) times daily. 07/31/13  Yes Historical Provider, MD  acyclovir (ZOVIRAX) 800 MG tablet Take 1 tablet (800 mg total) by mouth 5 (five) times daily. 08/07/13   Hope Bunnie Pion, NP  doxycycline (VIBRAMYCIN) 100 MG capsule Take 1 capsule (100 mg total) by mouth 2 (two) times daily. For 14 days 08/04/13   Tammy L. Triplett, PA-C  oxyCODONE-acetaminophen (PERCOCET) 7.5-325 MG per tablet Take 1 tablet by mouth every 4 (four) hours as needed for pain. 08/07/13   Hope Bunnie Pion, NP  predniSONE (DELTASONE) 20 MG tablet Two tabs po qd x 4 days 08/04/13   Tammy L. Triplett, PA-C    Allergies:  No Known Allergies  Social History:  reports that he has never smoked. He does not have any smokeless tobacco history on file. He reports that he drinks alcohol. He reports that he does not use illicit drugs.  Family History: History reviewed. No pertinent family history.  Physical Exam: Filed Vitals:   10/17/13 0130 10/17/13 0200 10/17/13 0240 10/17/13 0244  BP: 131/77 138/65  142/68  Pulse: 48 48  50  Temp:    98.7 F (37.1 C)  TempSrc:    Oral  Resp: 18 21  20   Height:   6' (1.829 m)   Weight:   87.544 kg (193 lb)   SpO2: 100% 99%     General appearance: alert, cooperative and no distress Head: Normocephalic, without obvious abnormality, atraumatic Eyes: negative Nose: Nares normal. Septum midline. Mucosa normal. No drainage or sinus tenderness. Neck: no JVD and supple, symmetrical, trachea midline Lungs: clear to auscultation bilaterally Heart: regular rate and rhythm, S1, S2 normal, no murmur, click, rub or gallop Abdomen: soft, non-tender; bowel sounds normal; no masses,  no organomegaly Extremities: extremities normal, atraumatic, no cyanosis or edema Pulses: 2+ and symmetric Skin: Skin color, texture, turgor normal. No rashes or  lesions Neurologic: Grossly normal  Labs on Admission:   Recent Labs  10/17/13 0038  NA 140  K 4.4  CL 104  CO2 23  GLUCOSE 115*  BUN 23  CREATININE 1.02  CALCIUM 9.7    Recent Labs  10/17/13 0038  WBC 10.0  NEUTROABS 4.6  HGB 13.3  HCT 39.6  MCV 86.5  PLT 208   Radiological Exams on Admission: Dg Chest Portable 1 View  10/17/2013   CLINICAL DATA:  Shortness of breath, hypertension.  EXAM: PORTABLE CHEST - 1 VIEW  COMPARISON:  Chest radiograph August 21, 2006  FINDINGS: Cardiomediastinal silhouette is unremarkable. The lungs are clear without pleural effusions or focal consolidations. Trachea projects midline and there is no pneumothorax. Soft tissue planes and included osseous structures are non-suspicious.  IMPRESSION: No acute cardiopulmonary process.   Electronically Signed   By: Elon Alas   On: 10/17/2013 00:33    Assessment/Plan  73 yo male with acute onset of sob relieved with ativan  Principal Problem:   SOB (shortness of breath)-  Unclear etiology.  Suspect may be anxiety related.  Will serial enzymes, and ck ddimer.  But now resolved by ativan.  Vss, mildly bradycardic, monitor on tele.  Low suspicion for PE.  cxr is neg also with no evidence of edema or infiltrate.  Active Problems:  Stable unless o/w noted.   Hypertension   Diabetes mellitus without complication   Sinus bradycardia  obs on tele.  Full code.  Aaron Bostwick A 10/17/2013, 3:08 AM

## 2013-10-17 NOTE — Care Management Note (Signed)
    Page 1 of 1   10/17/2013     2:16:33 PM CARE MANAGEMENT NOTE 10/17/2013  Patient:  Patrick Phillips,Patrick Phillips   Account Number:  [id number]  Date Initiated:  10/17/2013  Documentation initiated by:  Jolene Provost  Subjective/Objective Assessment:   Patient is from home, independent with ADL's. Pt has no HH services, DME's or medication needs prior to admission.     Action/Plan:   Pt plans to discharge home today with self care. Pt has no CM needs at this time.   Anticipated DC Date:  10/17/2013   Anticipated DC Plan:  [place]  CM consult      Choice offered to / List presented to:             Status of service:  Completed, signed off Medicare Important Message given?   (If response is "NO", the following Medicare IM given date fields will be blank) Date Medicare IM given:   Medicare IM given by:   Date Additional Medicare IM given:   Additional Medicare IM given by:    Discharge Disposition:  HOME/SELF CARE  Per UR Regulation:    If discussed at Long Length of Stay Meetings, dates discussed:    Comments:  10/17/2013 Page, RN, MSN, [facility]

## 2014-04-26 ENCOUNTER — Emergency Department (HOSPITAL_COMMUNITY): Payer: Medicare Other

## 2014-04-26 ENCOUNTER — Emergency Department (HOSPITAL_COMMUNITY)
Admission: EM | Admit: 2014-04-26 | Discharge: 2014-04-26 | Disposition: A | Discharge status: Home / Self Care | Payer: Medicare Other | Attending: Emergency Medicine | Admitting: Emergency Medicine

## 2014-04-26 ENCOUNTER — Encounter (HOSPITAL_COMMUNITY): Payer: Self-pay | Admitting: Emergency Medicine

## 2014-04-26 DIAGNOSIS — J40 Bronchitis, not specified as acute or chronic: Secondary | ICD-10-CM | POA: Insufficient documentation

## 2014-04-26 DIAGNOSIS — E119 Type 2 diabetes mellitus without complications: Secondary | ICD-10-CM | POA: Insufficient documentation

## 2014-04-26 DIAGNOSIS — R059 Cough, unspecified: Secondary | ICD-10-CM

## 2014-04-26 DIAGNOSIS — R05 Cough: Secondary | ICD-10-CM

## 2014-04-26 DIAGNOSIS — Z792 Long term (current) use of antibiotics: Secondary | ICD-10-CM | POA: Insufficient documentation

## 2014-04-26 DIAGNOSIS — Z85828 Personal history of other malignant neoplasm of skin: Secondary | ICD-10-CM | POA: Diagnosis not present

## 2014-04-26 DIAGNOSIS — Z79899 Other long term (current) drug therapy: Secondary | ICD-10-CM | POA: Diagnosis not present

## 2014-04-26 DIAGNOSIS — Z7952 Long term (current) use of systemic steroids: Secondary | ICD-10-CM | POA: Insufficient documentation

## 2014-04-26 DIAGNOSIS — J029 Acute pharyngitis, unspecified: Secondary | ICD-10-CM | POA: Diagnosis present

## 2014-04-26 DIAGNOSIS — I10 Essential (primary) hypertension: Secondary | ICD-10-CM | POA: Insufficient documentation

## 2014-04-26 LAB — COMPREHENSIVE METABOLIC PANEL
ALBUMIN: 3.8 g/dL (ref 3.5–5.2)
ALT: 17 U/L (ref 0–53)
AST: 19 U/L (ref 0–37)
Alkaline Phosphatase: 53 U/L (ref 39–117)
Anion gap: 3 — ABNORMAL LOW (ref 5–15)
BILIRUBIN TOTAL: 0.3 mg/dL (ref 0.3–1.2)
BUN: 19 mg/dL (ref 6–23)
CALCIUM: 9.1 mg/dL (ref 8.4–10.5)
CO2: 25 mmol/L (ref 19–32)
Chloride: 110 mmol/L (ref 96–112)
Creatinine, Ser: 1.04 mg/dL (ref 0.50–1.35)
GFR calc Af Amer: 81 mL/min — ABNORMAL LOW (ref >90)
GFR calc non Af Amer: 70 mL/min — ABNORMAL LOW (ref >90)
Glucose, Bld: 101 mg/dL — ABNORMAL HIGH (ref 70–99)
Potassium: 4.2 mmol/L (ref 3.5–5.1)
Sodium: 138 mmol/L (ref 135–145)
TOTAL PROTEIN: 6.6 g/dL (ref 6.0–8.3)

## 2014-04-26 LAB — CBC WITH DIFFERENTIAL/PLATELET
BASOS PCT: 0 % (ref 0–1)
Basophils Absolute: 0 10*3/uL (ref 0.0–0.1)
EOS PCT: 4 % (ref 0–5)
Eosinophils Absolute: 0.4 10*3/uL (ref 0.0–0.7)
HEMATOCRIT: 40.6 % (ref 39.0–52.0)
Hemoglobin: 13.6 g/dL (ref 13.0–17.0)
LYMPHS ABS: 3.6 10*3/uL (ref 0.7–4.0)
Lymphocytes Relative: 37 % (ref 12–46)
MCH: 29.3 pg (ref 26.0–34.0)
MCHC: 33.5 g/dL (ref 30.0–36.0)
MCV: 87.5 fL (ref 78.0–100.0)
Monocytes Absolute: 0.5 10*3/uL (ref 0.1–1.0)
Monocytes Relative: 6 % (ref 3–12)
NEUTROS ABS: 5.3 10*3/uL (ref 1.7–7.7)
Neutrophils Relative %: 53 % (ref 43–77)
PLATELETS: 207 10*3/uL (ref 150–400)
RBC: 4.64 MIL/uL (ref 4.22–5.81)
RDW: 13.3 % (ref 11.5–15.5)
WBC: 9.9 10*3/uL (ref 4.0–10.5)

## 2014-04-26 LAB — RAPID STREP SCREEN (MED CTR MEBANE ONLY): STREPTOCOCCUS, GROUP A SCREEN (DIRECT): NEGATIVE

## 2014-04-26 MED ORDER — PREDNISONE 10 MG PO TABS: 40.0000 mg | ORAL_TABLET | Freq: Every day | ORAL | Status: DC

## 2014-04-26 MED ORDER — PREDNISONE 50 MG PO TABS
60.0000 mg | ORAL_TABLET | Freq: Once | ORAL | Status: AC
  Administered 2014-04-26: 60 mg via ORAL
  Filled 2014-04-26 (×2): qty 1

## 2014-04-26 MED ORDER — DM-GUAIFENESIN ER 30-600 MG PO TB12
1.0000 | ORAL_TABLET | Freq: Two times a day (BID) | ORAL | Status: DC
Start: 2014-04-26 — End: 2015-02-22

## 2014-04-26 MED ORDER — ALBUTEROL SULFATE HFA 108 (90 BASE) MCG/ACT IN AERS
2.0000 | INHALATION_SPRAY | Freq: Four times a day (QID) | RESPIRATORY_TRACT | Status: DC
  Administered 2014-04-26: 2 via RESPIRATORY_TRACT
  Filled 2014-04-26: qty 6.7

## 2014-04-26 NOTE — ED Provider Notes (Signed)
CSN: [id number]     Arrival date & time 04/26/14  1505 History  This chart was scribed for Fredia Sorrow, MD by Hilda Lias, ED Scribe. This patient was seen in room APA15/AP[facility]15 and the patient's care was started at 4:37 PM.    Chief Complaint  Patient presents with  . Sore Throat    The history is provided by the patient and a relative. No language interpreter was used.     HPI Comments: Patrick Phillips is a 74 y.o. male who presents to the Emergency Department complaining of a worsening voice change with associated worsening productive cough, sore throat, and rhinorrhea that has been present for two weeks. Pt states that his symptoms began with a change in his voice which he describes as "haorseness" and a productive cough with yellow phlegm as well as sore throat. Pt states that he saw his PCP in [place] for those symptoms two weeks ago, and was prescribed a medication where he states he took one per day for five days, but does not know what the medication was. Pt states that his symptoms remained unchanged upon completion of the medication, then began to worsen, so he saw his PCP 4 days ago once again, where he was not prescribed any medication. Pt states that he had none of these symptoms prior to the sudden onset of his hoarse voice and productive cough. Pt denies fever, nausea, vomiting, headache, abdominal pain, body aches.   Past Medical History  Diagnosis Date  . Hypertension   . Diabetes mellitus without complication   . Cancer     skin cancer   Past Surgical History  Procedure Laterality Date  . Hernia repair    . Back surgery     Family History  Problem Relation Age of Onset  . Cancer Mother   . Cancer Father   . Cancer Brother   . Cancer Other    History  Substance Use Topics  . Smoking status: Never Smoker   . Smokeless tobacco: Never Used  . Alcohol Use: Yes    Review of Systems  Constitutional: Negative for fever and chills.  HENT: Positive for congestion,  rhinorrhea, sinus pressure and sore throat. Negative for nosebleeds.   Eyes: Negative for photophobia and visual disturbance.  Respiratory: Positive for cough. Negative for shortness of breath.   Cardiovascular: Negative for chest pain and leg swelling.  Gastrointestinal: Negative for nausea, vomiting, abdominal pain and diarrhea.  Genitourinary: Negative for dysuria and hematuria.  Musculoskeletal: Negative for back pain and neck pain.  Skin: Negative for rash.  Neurological: Negative for headaches.  Hematological: Does not bruise/bleed easily.  Psychiatric/Behavioral: Negative for confusion.  All other systems reviewed and are negative.     Allergies  Review of patient's allergies indicates no known allergies.  Home Medications   Prior to Admission medications   Medication Sig Start Date End Date Taking? Authorizing Provider  enalapril (VASOTEC) 20 MG tablet Take 20 mg by mouth every morning.  07/31/13  Yes Historical Provider, MD  metFORMIN (GLUCOPHAGE) 500 MG tablet Take 1,000 mg by mouth 2 (two) times daily. 07/31/13  Yes Historical Provider, MD  acyclovir (ZOVIRAX) 800 MG tablet Take 1 tablet (800 mg total) by mouth 5 (five) times daily. Patient not taking: Reported on 04/26/2014 08/07/13   Ashley Murrain, NP  dextromethorphan-guaiFENesin [facility] DM) 30-600 MG per 12 hr tablet Take 1 tablet by mouth 2 (two) times daily. 04/26/14   Fredia Sorrow, MD  doxycycline (VIBRAMYCIN) 100 MG  capsule Take 1 capsule (100 mg total) by mouth 2 (two) times daily. For 14 days Patient not taking: Reported on 04/26/2014 08/04/13   Tammy L. Triplett, PA-C  oxyCODONE-acetaminophen (PERCOCET) 7.5-325 MG per tablet Take 1 tablet by mouth every 4 (four) hours as needed for pain. Patient not taking: Reported on 04/26/2014 08/07/13   Ashley Murrain, NP  predniSONE (DELTASONE) 10 MG tablet Take 4 tablets (40 mg total) by mouth daily. 04/26/14   Fredia Sorrow, MD  predniSONE (DELTASONE) 20 MG tablet Two tabs po qd x  4 days Patient not taking: Reported on 04/26/2014 08/04/13   Tammy L. Triplett, PA-C   BP 161/58 mmHg  Pulse 55  Temp(Src) 97.9 F (36.6 C) (Oral)  Resp 18  Ht 6' (1.829 m)  Wt 192 lb (87.091 kg)  BMI 26.03 kg/m2  SpO2 98% Physical Exam  Constitutional: He is oriented to person, place, and time. He appears well-developed and well-nourished.  HENT:  Erythema in back of throat No tonsillar exudates Uvula midline  Eyes: Pupils are equal, round, and reactive to light.  Cardiovascular: Normal rate, regular rhythm and normal heart sounds.   No murmur heard. Pulmonary/Chest: Effort normal and breath sounds normal. He has no wheezes. He has no rales.  Abdominal: Bowel sounds are normal. There is no tenderness.  Musculoskeletal: He exhibits no edema.  Neurological: He is alert and oriented to person, place, and time. No cranial nerve deficit. He exhibits normal muscle tone. Coordination normal.  Nursing note and vitals reviewed.   ED Course  Procedures (including critical care time)  DIAGNOSTIC STUDIES: Oxygen Saturation is 100% on RA, normal by my interpretation.    COORDINATION OF CARE: 4:59 PM Discussed treatment plan with pt at bedside and pt agreed to plan.   Labs Review Labs Reviewed  COMPREHENSIVE METABOLIC PANEL - Abnormal; Notable for the following:    Glucose, Bld 101 (*)    GFR calc non Af Amer 70 (*)    GFR calc Af Amer 81 (*)    Anion gap 3 (*)    All other components within normal limits  RAPID STREP SCREEN  CULTURE, GROUP A STREP  CBC WITH DIFFERENTIAL/PLATELET   Results for orders placed or performed during the hospital encounter of 04/26/14  Rapid strep screen  Result Value Ref Range   Streptococcus, Group A Screen (Direct) NEGATIVE NEGATIVE  CBC with Differential/Platelet  Result Value Ref Range   WBC 9.9 4.0 - 10.5 K/uL   RBC 4.64 4.22 - 5.81 MIL/uL   Hemoglobin 13.6 13.0 - 17.0 g/dL   HCT 40.6 39.0 - 52.0 %   MCV 87.5 78.0 - 100.0 fL   MCH 29.3  26.0 - 34.0 pg   MCHC 33.5 30.0 - 36.0 g/dL   RDW 13.3 11.5 - 15.5 %   Platelets 207 150 - 400 K/uL   Neutrophils Relative % 53 43 - 77 %   Neutro Abs 5.3 1.7 - 7.7 K/uL   Lymphocytes Relative 37 12 - 46 %   Lymphs Abs 3.6 0.7 - 4.0 K/uL   Monocytes Relative 6 3 - 12 %   Monocytes Absolute 0.5 0.1 - 1.0 K/uL   Eosinophils Relative 4 0 - 5 %   Eosinophils Absolute 0.4 0.0 - 0.7 K/uL   Basophils Relative 0 0 - 1 %   Basophils Absolute 0.0 0.0 - 0.1 K/uL  Comprehensive metabolic panel  Result Value Ref Range   Sodium 138 135 - 145 mmol/L   Potassium  4.2 3.5 - 5.1 mmol/L   Chloride 110 96 - 112 mmol/L   CO2 25 19 - 32 mmol/L   Glucose, Bld 101 (H) 70 - 99 mg/dL   BUN 19 6 - 23 mg/dL   Creatinine, Ser 1.04 0.50 - 1.35 mg/dL   Calcium 9.1 8.4 - 10.5 mg/dL   Total Protein 6.6 6.0 - 8.3 g/dL   Albumin 3.8 3.5 - 5.2 g/dL   AST 19 0 - 37 U/L   ALT 17 0 - 53 U/L   Alkaline Phosphatase 53 39 - 117 U/L   Total Bilirubin 0.3 0.3 - 1.2 mg/dL   GFR calc non Af Amer 70 (L) >90 mL/min   GFR calc Af Amer 81 (L) >90 mL/min   Anion gap 3 (L) 5 - 15     Imaging Review Dg Chest 2 View  04/26/2014   CLINICAL DATA:  Acute onset of cough, chest congestion, nasal congestion and hoarseness. Initial encounter.  EXAM: CHEST  2 VIEW  COMPARISON:  Chest radiograph performed 10/17/2013  FINDINGS: The lungs are well-aerated. Minimal right-sided atelectasis is noted. There is no evidence of pleural effusion or pneumothorax.  The heart is normal in size; the mediastinal contour is within normal limits. No acute osseous abnormalities are seen.  IMPRESSION: Minimal right-sided atelectasis noted.  Lungs otherwise clear.   Electronically Signed   By: Garald Balding M.D.   On: 04/26/2014 18:36   Ct Head Wo Contrast  04/26/2014   CLINICAL DATA:  Worsening productive cough, voice changes.  EXAM: CT HEAD WITHOUT CONTRAST  CT MAXILLOFACIAL WITHOUT CONTRAST  TECHNIQUE: Multidetector CT imaging of the head and  maxillofacial structures were performed using the standard protocol without intravenous contrast. Multiplanar CT image reconstructions of the maxillofacial structures were also generated.  COMPARISON:  None.  FINDINGS: CT HEAD FINDINGS  Mild diffuse cerebral atrophy, age appropriate. No acute intracranial abnormality. Specifically, no hemorrhage, hydrocephalus, mass lesion, acute infarction, or significant intracranial injury. No acute calvarial abnormality.  Mucosal thickening in the ethmoid air cells. No air-fluid levels. Mastoid air cells are clear.  CT MAXILLOFACIAL FINDINGS  Mucosal thickening within the ethmoid air cells and frontal sinuses. Mucosal thickening within the maxillary sinuses, ethmoid air cells and frontal sinuses. No air-fluid levels. Mastoid air cells are clear. Orbital soft tissues are unremarkable. Visualized airway is patent. Epiglottis is normal. No adenopathy in the upper cervical spine. No acute bony abnormality.  IMPRESSION: No acute intracranial abnormality.  Chronic sinusitis.   Electronically Signed   By: Rolm Baptise M.D.   On: 04/26/2014 19:04   Ct Soft Tissue Neck Wo Contrast  04/26/2014   CLINICAL DATA:  Worsening voice difficulty with productive cough. Hoarseness. Symptoms for 2 weeks. Initial encounter.  EXAM: CT NECK WITHOUT CONTRAST  TECHNIQUE: Multidetector CT imaging of the neck was performed following the standard protocol without intravenous contrast.  COMPARISON:  None.  FINDINGS: Minor asymmetry of the pharyngeal soft tissues relates in part to BILATERAL tonsillar calcifications also noted is the. Prominent RIGHT styloid process. No mucosal lesion is evident on this noncontrast scan.  Airway midline. Normal epiglottis. BILATERAL carotid calcification. No adenopathy. Normal larynx. No lung apex lesion. No worrisome osseous findings. Unremarkable thyroid. Patient is largely edentulous. Major and minor salivary glands unremarkable.  IMPRESSION: Unremarkable noncontrast CT  neck. No features to suggest significant acute pharyngeal/tonsillar inflammation or neoplasm.   Electronically Signed   By: Rolla Flatten M.D.   On: 04/26/2014 19:17   Ct Maxillofacial Wo Cm  04/26/2014   CLINICAL DATA:  Worsening productive cough, voice changes.  EXAM: CT HEAD WITHOUT CONTRAST  CT MAXILLOFACIAL WITHOUT CONTRAST  TECHNIQUE: Multidetector CT imaging of the head and maxillofacial structures were performed using the standard protocol without intravenous contrast. Multiplanar CT image reconstructions of the maxillofacial structures were also generated.  COMPARISON:  None.  FINDINGS: CT HEAD FINDINGS  Mild diffuse cerebral atrophy, age appropriate. No acute intracranial abnormality. Specifically, no hemorrhage, hydrocephalus, mass lesion, acute infarction, or significant intracranial injury. No acute calvarial abnormality.  Mucosal thickening in the ethmoid air cells. No air-fluid levels. Mastoid air cells are clear.  CT MAXILLOFACIAL FINDINGS  Mucosal thickening within the ethmoid air cells and frontal sinuses. Mucosal thickening within the maxillary sinuses, ethmoid air cells and frontal sinuses. No air-fluid levels. Mastoid air cells are clear. Orbital soft tissues are unremarkable. Visualized airway is patent. Epiglottis is normal. No adenopathy in the upper cervical spine. No acute bony abnormality.  IMPRESSION: No acute intracranial abnormality.  Chronic sinusitis.   Electronically Signed   By: Rolm Baptise M.D.   On: 04/26/2014 19:04     EKG Interpretation None      MDM   Final diagnoses:  Cough  Bronchitis    Patient with extensive workup without any significant abnormal findings he neck pharynx laryngeal area. Has some chronic sinusitis. But symptoms seem to be consistent with an acute upper respiratory infection with bronchitis and laryngitis. Will treat with prednisone and albuterol inhaler and Mucinex DM. In addition no evidence of pneumonia.  I personally performed the  services described in this documentation, which was scribed in my presence. The recorded information has been reviewed and is accurate.     Fredia Sorrow, MD 04/26/14 2002

## 2014-04-26 NOTE — Discharge Instructions (Signed)
Extensive workup for the persistent cough and laryngitis without any significant findings. Suspect this is all related to a viral process. Take medications as directed. Return for any new or worse symptoms. Follow up with your doctor if not improved in one week.

## 2014-04-26 NOTE — ED Notes (Signed)
Pt c/o cough at night, productive with clear sputum. Pt states he is having difficulty eating. Pt feels like his throat is swollen.

## 2014-05-05 ENCOUNTER — Emergency Department (HOSPITAL_COMMUNITY)
Admission: EM | Admit: 2014-05-05 | Discharge: 2014-05-05 | Disposition: A | Discharge status: Home / Self Care | Payer: Medicare Other | Attending: Emergency Medicine | Admitting: Emergency Medicine

## 2014-05-05 ENCOUNTER — Emergency Department (HOSPITAL_COMMUNITY): Payer: Medicare Other

## 2014-05-05 ENCOUNTER — Encounter (HOSPITAL_COMMUNITY): Payer: Self-pay | Admitting: Emergency Medicine

## 2014-05-05 DIAGNOSIS — I10 Essential (primary) hypertension: Secondary | ICD-10-CM | POA: Insufficient documentation

## 2014-05-05 DIAGNOSIS — Z85828 Personal history of other malignant neoplasm of skin: Secondary | ICD-10-CM | POA: Diagnosis not present

## 2014-05-05 DIAGNOSIS — Z79899 Other long term (current) drug therapy: Secondary | ICD-10-CM | POA: Diagnosis not present

## 2014-05-05 DIAGNOSIS — R05 Cough: Secondary | ICD-10-CM | POA: Diagnosis present

## 2014-05-05 DIAGNOSIS — Z7952 Long term (current) use of systemic steroids: Secondary | ICD-10-CM | POA: Insufficient documentation

## 2014-05-05 DIAGNOSIS — J029 Acute pharyngitis, unspecified: Secondary | ICD-10-CM | POA: Diagnosis not present

## 2014-05-05 DIAGNOSIS — E119 Type 2 diabetes mellitus without complications: Secondary | ICD-10-CM | POA: Insufficient documentation

## 2014-05-05 LAB — CBC WITH DIFFERENTIAL/PLATELET
BASOS ABS: 0 10*3/uL (ref 0.0–0.1)
Basophils Relative: 0 % (ref 0–1)
EOS PCT: 1 % (ref 0–5)
Eosinophils Absolute: 0.1 10*3/uL (ref 0.0–0.7)
HCT: 42.4 % (ref 39.0–52.0)
Hemoglobin: 14 g/dL (ref 13.0–17.0)
LYMPHS PCT: 19 % (ref 12–46)
Lymphs Abs: 2.6 10*3/uL (ref 0.7–4.0)
MCH: 28.7 pg (ref 26.0–34.0)
MCHC: 33 g/dL (ref 30.0–36.0)
MCV: 87.1 fL (ref 78.0–100.0)
Monocytes Absolute: 0.6 10*3/uL (ref 0.1–1.0)
Monocytes Relative: 4 % (ref 3–12)
NEUTROS ABS: 10.5 10*3/uL — AB (ref 1.7–7.7)
Neutrophils Relative %: 76 % (ref 43–77)
PLATELETS: 242 10*3/uL (ref 150–400)
RBC: 4.87 MIL/uL (ref 4.22–5.81)
RDW: 13.6 % (ref 11.5–15.5)
WBC: 13.8 10*3/uL — AB (ref 4.0–10.5)

## 2014-05-05 LAB — COMPREHENSIVE METABOLIC PANEL
ALT: 18 U/L (ref 0–53)
AST: 18 U/L (ref 0–37)
Albumin: 3.8 g/dL (ref 3.5–5.2)
Alkaline Phosphatase: 54 U/L (ref 39–117)
Anion gap: 6 (ref 5–15)
BUN: 23 mg/dL (ref 6–23)
CO2: 25 mmol/L (ref 19–32)
CREATININE: 1.06 mg/dL (ref 0.50–1.35)
Calcium: 9.5 mg/dL (ref 8.4–10.5)
Chloride: 106 mmol/L (ref 96–112)
GFR calc Af Amer: 79 mL/min — ABNORMAL LOW (ref >90)
GFR, EST NON AFRICAN AMERICAN: 68 mL/min — AB (ref >90)
GLUCOSE: 202 mg/dL — AB (ref 70–99)
Potassium: 4.9 mmol/L (ref 3.5–5.1)
SODIUM: 137 mmol/L (ref 135–145)
Total Bilirubin: 0.3 mg/dL (ref 0.3–1.2)
Total Protein: 6.7 g/dL (ref 6.0–8.3)

## 2014-05-05 LAB — RAPID STREP SCREEN (MED CTR MEBANE ONLY): Streptococcus, Group A Screen (Direct): NEGATIVE

## 2014-05-05 MED ORDER — FLUCONAZOLE 100 MG PO TABS
200.0000 mg | ORAL_TABLET | Freq: Once | ORAL | Status: AC
  Administered 2014-05-05: 200 mg via ORAL
  Filled 2014-05-05: qty 2

## 2014-05-05 MED ORDER — FLUCONAZOLE 200 MG PO TABS: 200.0000 mg | ORAL_TABLET | Freq: Every day | ORAL | Status: AC

## 2014-05-05 MED ORDER — LIDOCAINE VISCOUS 2 % MT SOLN: 15.0000 mL | OROMUCOSAL | Status: DC | PRN

## 2014-05-05 MED ORDER — LIDOCAINE VISCOUS 2 % MT SOLN
15.0000 mL | Freq: Once | OROMUCOSAL | Status: AC
  Administered 2014-05-05: 15 mL via OROMUCOSAL
  Filled 2014-05-05: qty 15

## 2014-05-05 NOTE — ED Notes (Signed)
Pt c/o cough and congestion. Has been seen here and at his PCP, reports meds have not worked. Pt states his throat is scratchy and the congestion is in his chest.

## 2014-05-05 NOTE — ED Provider Notes (Signed)
CSN: [id number]     Arrival date & time 05/05/14  1617 History  This chart was scribed for Carmin Muskrat, MD by Molli Posey, ED Scribe. This patient was seen in room APA02/APA02 and the patient's care was started 6:59 PM.   Chief Complaint  Patient presents with  . Cough   The history is provided by the patient. No language interpreter was used.   HPI Comments: Patrick Phillips is a 74 y.o. male with a history of HTN, DM and skin CA who presents to the Emergency Department complaining of a recurrent cough for the last month. He states that the onset of his symptoms began with hoarseness and nasal congestion. Pt reports associated difficulty speaking and says he feels like he cannot get his congestion up. His family reports that he has been to his PCP and the ED 10 days ago for his symptoms. Pt was discharged with prednisone, albuterol and mucinex DM. He states that he has completed these medications failed to relieve his symptoms. Pt reports no history of smoking. He denies nausea, vomiting, CP and fever.    Past Medical History  Diagnosis Date  . Hypertension   . Diabetes mellitus without complication   . Cancer     skin cancer   Past Surgical History  Procedure Laterality Date  . Hernia repair    . Back surgery     Family History  Problem Relation Age of Onset  . Cancer Mother   . Cancer Father   . Cancer Brother   . Cancer Other    History  Substance Use Topics  . Smoking status: Never Smoker   . Smokeless tobacco: Never Used  . Alcohol Use: Yes    Review of Systems  Constitutional:       Per HPI, otherwise negative  HENT: Positive for voice change.        Per HPI, otherwise negative  Respiratory: Positive for cough.        Per HPI, otherwise negative  Cardiovascular:       Per HPI, otherwise negative  Gastrointestinal: Negative for vomiting.  Endocrine:       Negative aside from HPI  Genitourinary:       Neg aside from HPI   Musculoskeletal:       Per HPI,  otherwise negative  Skin: Negative.   Neurological: Negative for syncope.      Allergies  Review of patient's allergies indicates no known allergies.  Home Medications   Prior to Admission medications   Medication Sig Start Date End Date Taking? Authorizing Provider  acyclovir (ZOVIRAX) 800 MG tablet Take 1 tablet (800 mg total) by mouth 5 (five) times daily. Patient not taking: Reported on 04/26/2014 08/07/13   Ashley Murrain, NP  dextromethorphan-guaiFENesin [facility] DM) 30-600 MG per 12 hr tablet Take 1 tablet by mouth 2 (two) times daily. 04/26/14   Fredia Sorrow, MD  doxycycline (VIBRAMYCIN) 100 MG capsule Take 1 capsule (100 mg total) by mouth 2 (two) times daily. For 14 days Patient not taking: Reported on 04/26/2014 08/04/13   Tammi Triplett, PA-C  enalapril (VASOTEC) 20 MG tablet Take 20 mg by mouth every morning.  07/31/13   Historical Provider, MD  metFORMIN (GLUCOPHAGE) 500 MG tablet Take 1,000 mg by mouth 2 (two) times daily. 07/31/13   Historical Provider, MD  oxyCODONE-acetaminophen (PERCOCET) 7.5-325 MG per tablet Take 1 tablet by mouth every 4 (four) hours as needed for pain. Patient not taking: Reported on 04/26/2014 08/07/13  Hope Bunnie Pion, NP  predniSONE (DELTASONE) 10 MG tablet Take 4 tablets (40 mg total) by mouth daily. 04/26/14   Fredia Sorrow, MD  predniSONE (DELTASONE) 20 MG tablet Two tabs po qd x 4 days Patient not taking: Reported on 04/26/2014 08/04/13   Tammi Triplett, PA-C   BP 164/70 mmHg  Pulse 62  Temp(Src) 98.4 F (36.9 C) (Oral)  Resp 14  Ht 6' (1.829 m)  Wt 193 lb (87.544 kg)  BMI 26.17 kg/m2  SpO2 99% Physical Exam  Constitutional: He is oriented to person, place, and time. He appears well-developed. No distress.  HENT:  Head: Normocephalic and atraumatic.  Eyes: Conjunctivae and EOM are normal.  Cardiovascular: Normal rate, regular rhythm and normal heart sounds.   Pulmonary/Chest: Effort normal and breath sounds normal. No stridor. No respiratory  distress. He has no wheezes. He has no rales.  Abdominal: He exhibits no distension.  Musculoskeletal: He exhibits no edema.  Neurological: He is alert and oriented to person, place, and time.  Skin: Skin is warm and dry.  Psychiatric: He has a normal mood and affect.  Nursing note and vitals reviewed.   ED Course  Procedures   DIAGNOSTIC STUDIES: Oxygen Saturation is 99% on RA, normal by my interpretation.    COORDINATION OF CARE: 7:09 PM Discussed treatment plan with pt at bedside and pt agreed to plan.  Labs Review Labs Reviewed  CBC WITH DIFFERENTIAL/PLATELET - Abnormal; Notable for the following:    WBC 13.8 (*)    Neutro Abs 10.5 (*)    All other components within normal limits  COMPREHENSIVE METABOLIC PANEL - Abnormal; Notable for the following:    Glucose, Bld 202 (*)    GFR calc non Af Amer 68 (*)    GFR calc Af Amer 79 (*)    All other components within normal limits  RAPID STREP SCREEN  CULTURE, GROUP A STREP    Imaging Review Dg Chest 2 View  05/05/2014   CLINICAL DATA:  Productive cough for 2 weeks, worsening.  EXAM: CHEST  2 VIEW  COMPARISON:  PA and lateral chest 04/26/2014. Single view of the chest 10/17/2013.  FINDINGS: The lungs are clear. Heart size is normal. No pneumothorax or pleural effusion. No focal bony abnormality.  IMPRESSION: No acute disease.   Electronically Signed   By: Inge Rise M.D.   On: 05/05/2014 16:49   I reviewed the x-ray, agree with the interpretation. I also reviewed the patient's chart, including CT imaging from 10 days ago. Patient had essentially unremarkable CT of the head, neck, face.  On repeat exam the patient appears calm, no evidence for respiratory distress, nor systemic infection. We discussed all findings, including hyperglycemia. Subsequently we discussed possibilities for his ongoing pharyngitis/laryngitis in spite of therapy over the past month. With hyperglycemia, diabetes, recent steroid use, there is a  possibility of Candida infection. Patient will be started empirically on therapy for this pending outpatient follow-up with ENT and primary care. Patient's wife was previously diagnosed with oropharyngeal cancer, diagnosed by ENT, and this is a concern for him and family members. We discussed the need for endoscopy to occur for further evaluation of the patient. Have the ENT contact information, will follow up tomorrow via telephone.    MDM   I personally performed the services described in this documentation, which was scribed in my presence. The recorded information has been reviewed and is accurate.   Patient presents with several weeks of throat congestion, difficulty clearing secretions.  Patient has been evaluated here and at his 34 office 3 times in the past 3 weeks completed multiple courses of multiple medications, with no relief. Here the patient is in no distress, is awake and alert, answering questions appropriately, with no evidence for respiratory compromise, nor systemic infection. Patient's recent CT scans did not demonstrate solid tumor. With largely reassuring findings, a ENT/endoscopy's the next appropriate step for evaluation, the patient will complete this as an outpatient. Patient was started on therapy for presumed candidal infection  Carmin Muskrat, MD 05/05/14 2016

## 2014-05-05 NOTE — Discharge Instructions (Signed)
Has discussed, your evaluation today has been largely reassuring.  Your ongoing speech difficulty, congestion in your throat, is likely due to either inflammation, or a candida (yeast) infection.  You are starting a regimen of anti-fungal medication for two weeks.  Please discuss this with your physicians to ensure that there will be minimal interactions with your other medications / health issues.  Following up with an ENT specialist is the next step in your evaluation.  Please take all medication as directed and be sure to return here for any concerning changes in your condition.

## 2014-05-07 LAB — CULTURE, GROUP A STREP
Strep A Culture: NEGATIVE
Strep A Culture: NEGATIVE

## 2014-07-15 ENCOUNTER — Emergency Department (HOSPITAL_COMMUNITY)
Admission: EM | Admit: 2014-07-15 | Discharge: 2014-07-16 | Discharge status: Against Medical Advice | Payer: Medicare Other | Attending: Emergency Medicine | Admitting: Emergency Medicine

## 2014-07-15 ENCOUNTER — Encounter (HOSPITAL_COMMUNITY): Payer: Self-pay | Admitting: Emergency Medicine

## 2014-07-15 DIAGNOSIS — S40861A Insect bite (nonvenomous) of right upper arm, initial encounter: Secondary | ICD-10-CM | POA: Diagnosis present

## 2014-07-15 DIAGNOSIS — Y939 Activity, unspecified: Secondary | ICD-10-CM | POA: Insufficient documentation

## 2014-07-15 DIAGNOSIS — W57XXXA Bitten or stung by nonvenomous insect and other nonvenomous arthropods, initial encounter: Secondary | ICD-10-CM | POA: Insufficient documentation

## 2014-07-15 DIAGNOSIS — Y929 Unspecified place or not applicable: Secondary | ICD-10-CM | POA: Insufficient documentation

## 2014-07-15 DIAGNOSIS — E119 Type 2 diabetes mellitus without complications: Secondary | ICD-10-CM | POA: Diagnosis not present

## 2014-07-15 DIAGNOSIS — Y998 Other external cause status: Secondary | ICD-10-CM | POA: Insufficient documentation

## 2014-07-15 DIAGNOSIS — I10 Essential (primary) hypertension: Secondary | ICD-10-CM | POA: Diagnosis not present

## 2014-07-15 NOTE — ED Notes (Signed)
Pt states he pulled a tick off his rt arm yesterday. Now the rt arm is red, swollen, and warm to the touch.

## 2014-07-15 NOTE — ED Notes (Signed)
Unable to locate pt in all waiting areas 

## 2014-07-16 NOTE — ED Notes (Signed)
Unable to locate pt in all waiting areas x 3

## 2015-02-22 ENCOUNTER — Encounter (HOSPITAL_COMMUNITY): Payer: Self-pay

## 2015-02-22 ENCOUNTER — Emergency Department (HOSPITAL_COMMUNITY)
Admission: EM | Admit: 2015-02-22 | Discharge: 2015-02-23 | Disposition: A | Discharge status: Home / Self Care | Payer: Medicare Other | Attending: Emergency Medicine | Admitting: Emergency Medicine

## 2015-02-22 ENCOUNTER — Emergency Department (HOSPITAL_COMMUNITY): Payer: Medicare Other

## 2015-02-22 DIAGNOSIS — Z85828 Personal history of other malignant neoplasm of skin: Secondary | ICD-10-CM | POA: Insufficient documentation

## 2015-02-22 DIAGNOSIS — Y9389 Activity, other specified: Secondary | ICD-10-CM | POA: Diagnosis not present

## 2015-02-22 DIAGNOSIS — W57XXXA Bitten or stung by nonvenomous insect and other nonvenomous arthropods, initial encounter: Secondary | ICD-10-CM | POA: Diagnosis not present

## 2015-02-22 DIAGNOSIS — E119 Type 2 diabetes mellitus without complications: Secondary | ICD-10-CM | POA: Insufficient documentation

## 2015-02-22 DIAGNOSIS — R05 Cough: Secondary | ICD-10-CM | POA: Diagnosis present

## 2015-02-22 DIAGNOSIS — Y998 Other external cause status: Secondary | ICD-10-CM | POA: Insufficient documentation

## 2015-02-22 DIAGNOSIS — I10 Essential (primary) hypertension: Secondary | ICD-10-CM | POA: Diagnosis not present

## 2015-02-22 DIAGNOSIS — S70261A Insect bite (nonvenomous), right hip, initial encounter: Secondary | ICD-10-CM | POA: Diagnosis not present

## 2015-02-22 DIAGNOSIS — B9789 Other viral agents as the cause of diseases classified elsewhere: Secondary | ICD-10-CM

## 2015-02-22 DIAGNOSIS — J069 Acute upper respiratory infection, unspecified: Secondary | ICD-10-CM | POA: Diagnosis not present

## 2015-02-22 DIAGNOSIS — Z79899 Other long term (current) drug therapy: Secondary | ICD-10-CM | POA: Insufficient documentation

## 2015-02-22 DIAGNOSIS — Y9289 Other specified places as the place of occurrence of the external cause: Secondary | ICD-10-CM | POA: Diagnosis not present

## 2015-02-22 DIAGNOSIS — Z7984 Long term (current) use of oral hypoglycemic drugs: Secondary | ICD-10-CM | POA: Insufficient documentation

## 2015-02-22 MED ORDER — DOXYCYCLINE HYCLATE 100 MG PO TABS
100.0000 mg | ORAL_TABLET | Freq: Once | ORAL | Status: AC
  Administered 2015-02-22: 100 mg via ORAL
  Filled 2015-02-22: qty 1

## 2015-02-22 NOTE — Discharge Instructions (Signed)
Tick Bite Information Ticks are insects that attach themselves to the skin and draw blood for food. There are various types of ticks. Common types include wood ticks and deer ticks. Most ticks live in shrubs and grassy areas. Ticks can climb onto your body when you make contact with leaves or grass where the tick is waiting. The most common places on the body for ticks to attach themselves are the scalp, neck, armpits, waist, and groin. Most tick bites are harmless, but sometimes ticks carry germs that cause diseases. These germs can be spread to a person during the tick's feeding process. The chance of a disease spreading through a tick bite depends on:   The type of tick.  Time of year.   How long the tick is attached.   Geographic location.  HOW CAN YOU PREVENT TICK BITES? Take these steps to help prevent tick bites when you are outdoors:  Wear protective clothing. Long sleeves and long pants are best.   Wear [place] clothes so you can see ticks more easily.  Tuck your pant legs into your socks.   If walking on a trail, stay in the middle of the trail to avoid brushing against bushes.  Avoid walking through areas with long grass.  Put insect repellent on all exposed skin and along boot tops, pant legs, and sleeve cuffs.   Check clothing, hair, and skin repeatedly and before going inside.   Brush off any ticks that are not attached.  Take a shower or bath as soon as possible after being outdoors.  WHAT IS THE PROPER WAY TO REMOVE A TICK? Ticks should be removed as soon as possible to help prevent diseases caused by tick bites.  If latex gloves are available, put them on before trying to remove a tick.   Using fine-point tweezers, grasp the tick as close to the skin as possible. You may also use curved forceps or a tick removal tool. Grasp the tick as close to its head as possible. Avoid grasping the tick on its body.  Pull gently with steady upward pressure until the  tick lets go. Do not twist the tick or jerk it suddenly. This may break off the tick's head or mouth parts.  Do not squeeze or crush the tick's body. This could force disease-carrying fluids from the tick into your body.   After the tick is removed, wash the bite area and your hands with soap and water or other disinfectant such as alcohol.  Apply a small amount of antiseptic cream or ointment to the bite site.   Wash and disinfect any instruments that were used.  Do not try to remove a tick by applying a hot match, petroleum jelly, or fingernail polish to the tick. These methods do not work and may increase the chances of disease being spread from the tick bite.  WHEN SHOULD YOU SEEK MEDICAL CARE? Contact your health care provider if you are unable to remove a tick from your skin or if a part of the tick breaks off and is stuck in the skin.  After a tick bite, you need to be aware of signs and symptoms that could be related to diseases spread by ticks. Contact your health care provider if you develop any of the following in the days or weeks after the tick bite:  Unexplained fever.  Rash. A circular rash that appears days or weeks after the tick bite may indicate the possibility of Lyme disease. The rash may resemble  a target with a bull's-eye and may occur at a different part of your body than the tick bite.  Redness and swelling in the area of the tick bite.   Tender, swollen lymph glands.   Diarrhea.   Weight loss.   Cough.   Fatigue.   Muscle, joint, or bone pain.   Abdominal pain.   Headache.   Lethargy or a change in your level of consciousness.  Difficulty walking or moving your legs.   Numbness in the legs.   Paralysis.  Shortness of breath.   Confusion.   Repeated vomiting.    This information is not intended to replace advice given to you by your health care provider. Make sure you discuss any questions you have with your health care  provider.   Document Released: 02/11/2000 Document Revised: 03/06/2014 Document Reviewed: 07/24/2012 Elsevier Interactive Patient Education 2016 Else[employer]  Upper Respiratory Infection, Adult Most upper respiratory infections (URIs) are a viral infection of the air passages leading to the lungs. A URI affects the nose, throat, and upper air passages. The most common type of URI is nasopharyngitis and is typically referred to as "the common cold." URIs run their course and usually go away on their own. Most of the time, a URI does not require medical attention, but sometimes a bacterial infection in the upper airways can follow a viral infection. This is called a secondary infection. Sinus and middle ear infections are common types of secondary upper respiratory infections. Bacterial pneumonia can also complicate a URI. A URI can worsen asthma and chronic obstructive pulmonary disease (COPD). Sometimes, these complications can require emergency medical care and may be life threatening.  CAUSES Almost all URIs are caused by viruses. A virus is a type of germ and can spread from one person to another.  RISKS FACTORS You may be at risk for a URI if:   You smoke.   You have chronic heart or lung disease.  You have a weakened defense (immune) system.   You are very young or very old.   You have nasal allergies or asthma.  You work in crowded or poorly ventilated areas.  You work in health care facilities or schools. SIGNS AND SYMPTOMS  Symptoms typically develop 2-3 days after you come in contact with a cold virus. Most viral URIs last 7-10 days. However, viral URIs from the influenza virus (flu virus) can last 14-18 days and are typically more severe. Symptoms may include:   Runny or stuffy (congested) nose.   Sneezing.   Cough.   Sore throat.   Headache.   Fatigue.   Fever.   Loss of appetite.   Pain in your forehead, behind your eyes, and over your cheekbones  (sinus pain).  Muscle aches.  DIAGNOSIS  Your health care provider may diagnose a URI by:  Physical exam.  Tests to check that your symptoms are not due to another condition such as:  Strep throat.  Sinusitis.  Pneumonia.  Asthma. TREATMENT  A URI goes away on its own with time. It cannot be cured with medicines, but medicines may be prescribed or recommended to relieve symptoms. Medicines may help:  Reduce your fever.  Reduce your cough.  Relieve nasal congestion. HOME CARE INSTRUCTIONS   Take medicines only as directed by your health care provider.   Gargle warm saltwater or take cough drops to comfort your throat as directed by your health care provider.  Use a warm mist humidifier or inhale steam from  a shower to increase air moisture. This may make it easier to breathe.  Drink enough fluid to keep your urine clear or pale yellow.   Eat soups and other clear broths and maintain good nutrition.   Rest as needed.   Return to work when your temperature has returned to normal or as your health care provider advises. You may need to stay home longer to avoid infecting others. You can also use a face mask and careful hand washing to prevent spread of the virus.  Increase the usage of your inhaler if you have asthma.   Do not use any tobacco products, including cigarettes, chewing tobacco, or electronic cigarettes. If you need help quitting, ask your health care provider. PREVENTION  The best way to protect yourself from getting a cold is to practice good hygiene.   Avoid oral or hand contact with people with cold symptoms.   Wash your hands often if contact occurs.  There is no clear evidence that vitamin C, vitamin E, echinacea, or exercise reduces the chance of developing a cold. However, it is always recommended to get plenty of rest, exercise, and practice good nutrition.  SEEK MEDICAL CARE IF:   You are getting worse rather than better.   Your  symptoms are not controlled by medicine.   You have chills.  You have worsening shortness of breath.  You have brown or red mucus.  You have yellow or brown nasal discharge.  You have pain in your face, especially when you bend forward.  You have a fever.  You have swollen neck glands.  You have pain while swallowing.  You have white areas in the back of your throat. SEEK IMMEDIATE MEDICAL CARE IF:   You have severe or persistent:  Headache.  Ear pain.  Sinus pain.  Chest pain.  You have chronic lung disease and any of the following:  Wheezing.  Prolonged cough.  Coughing up blood.  A change in your usual mucus.  You have a stiff neck.  You have changes in your:  Vision.  Hearing.  Thinking.  Mood. MAKE SURE YOU:   Understand these instructions.  Will watch your condition.  Will get help right away if you are not doing well or get worse.   This information is not intended to replace advice given to you by your health care provider. Make sure you discuss any questions you have with your health care provider.   Document Released: 08/09/2000 Document Revised: 06/30/2014 Document Reviewed: 05/21/2013 Elsevier Interactive Patient Education [employer].

## 2015-02-22 NOTE — ED Notes (Signed)
Patient reports of productive cough x 2-3 days with fever. C/o body aches.

## 2015-02-22 NOTE — ED Provider Notes (Signed)
TIME SEEN: 11:30 PM  CHIEF COMPLAINT: Cough, nasal congestion  HPI: Pt is a 74 y.o. male with history of hypertension, non-insulin-dependent diabetes who presents to the emergency department with 3 days of nasal congestion, reductive cough with clear to yellow sputum. He denies any fever despite nursing notes. He has had some body aches. Also has had sore throat. No vomiting or diarrhea. No known sick contacts.   Patient states that today he pulled a dog tick off of his right hip. There is a very small amount of erythema to this area. No drainage. He states it was a large tick, not small like a deer tick.  ROS: See HPI Constitutional: no fever  Eyes: no drainage  ENT: runny nose   Cardiovascular:  no chest pain  Resp: no SOB  GI: no vomiting GU: no dysuria Integumentary: no rash  Allergy: no hives  Musculoskeletal: no leg swelling  Neurological: no slurred speech ROS otherwise negative  PAST MEDICAL HISTORY/PAST SURGICAL HISTORY:  Past Medical History  Diagnosis Date  . Hypertension   . Diabetes mellitus without complication ([place])   . Cancer ([place])     skin cancer    MEDICATIONS:  Prior to Admission medications   Medication Sig Start Date End Date Taking? Authorizing Provider  amLODipine (NORVASC) 5 MG tablet Take 5 mg by mouth daily. 02/16/15  Yes Historical Provider, MD  losartan (COZAAR) 50 MG tablet Take 50 mg by mouth daily. 02/16/15  Yes Historical Provider, MD  metFORMIN (GLUCOPHAGE) 500 MG tablet Take 1,000 mg by mouth 2 (two) times daily. 07/31/13  Yes Historical Provider, MD    ALLERGIES:  No Known Allergies  SOCIAL HISTORY:  Social History  Substance Use Topics  . Smoking status: Never Smoker   . Smokeless tobacco: Never Used  . Alcohol Use: Yes     Comment: occ    FAMILY HISTORY: Family History  Problem Relation Age of Onset  . Cancer Mother   . Cancer Father   . Cancer Brother   . Cancer Other     EXAM: BP 166/65 mmHg  Pulse 74  Temp(Src) 98.4  F (36.9 C) (Oral)  Resp 16  Ht 6\' 1"  (1.854 m)  Wt 192 lb (87.091 kg)  BMI 25.34 kg/m2  SpO2 97% CONSTITUTIONAL: Alert and oriented and responds appropriately to questions. Well-appearing; well-nourished, elderly, afebrile, nontoxic, in no distress HEAD: Normocephalic EYES: Conjunctivae clear, PERRL ENT: normal nose; no rhinorrhea; moist mucous membranes; pharynx without lesions noted, no tonsillar hypertrophy or exudate, no uvular deviation, no trismus or drooling, normal phonation, no stridor NECK: Supple, no meningismus, no LAD  CARD: RRR; S1 and S2 appreciated; no murmurs, no clicks, no rubs, no gallops RESP: Normal chest excursion without splinting or tachypnea; breath sounds clear and equal bilaterally; no wheezes, no rhonchi, no rales, no hypoxia or respiratory distress, speaking full sentences ABD/GI: Normal bowel sounds; non-distended; soft, non-tender, no rebound, no guarding, no peritoneal signs BACK:  The back appears normal and is non-tender to palpation, there is no CVA tenderness EXT: Normal ROM in all joints; non-tender to palpation; no edema; normal capillary refill; no cyanosis, no calf tenderness or swelling    SKIN: Normal color for age and race; warm; 1 cm erythematous area to the right hip with no fluctuance or induration, there are no tick parts left over seen in the bite; no petechiae or purpura, no bull's-eye rash NEURO: Moves all extremities equally, sensation to light touch intact diffusely, cranial nerves II through XII  intact PSYCH: The patient's mood and manner are appropriate. Grooming and personal hygiene are appropriate.  MEDICAL DECISION MAKING: Patient here with complaints of viral upper respiratory infection. Chest x-ray is clear. He is afebrile and nontoxic. His lungs are clear to auscultation. Suspect this is a viral illness. Will discharge with Tessalon Perles and instructions to use Tylenol as needed for fever and pain. I do not feel he needs to be on  antibiotic for his URI at this time.  Patient did have a dog tick bite to the right hip and there does appear to be a 1 cm area of cellulitis here. No abscess. URI symptoms started before this tick bite. I do not think he has recommended spotted fever but will start him on doxycycline to cover for any tickborne illness although very unlikely as well as the small area of cellulitis given he is a diabetic.  Discussed return precautions with patient and family. They have a PCP for follow-up. They verbalize understanding and are comfortable with this plan.       [place], DO 02/23/15 [phone]

## 2015-02-23 MED ORDER — BENZONATATE 100 MG PO CAPS: 100.0000 mg | ORAL_CAPSULE | Freq: Three times a day (TID) | ORAL | Status: DC | PRN

## 2015-02-23 MED ORDER — DOXYCYCLINE HYCLATE 100 MG PO CAPS: 100.0000 mg | ORAL_CAPSULE | Freq: Two times a day (BID) | ORAL | Status: DC

## 2015-03-08 ENCOUNTER — Emergency Department (HOSPITAL_COMMUNITY): Payer: Medicare Other

## 2015-03-08 ENCOUNTER — Emergency Department (HOSPITAL_COMMUNITY)
Admission: EM | Admit: 2015-03-08 | Discharge: 2015-03-08 | Disposition: A | Discharge status: Home / Self Care | Payer: Medicare Other | Attending: Emergency Medicine | Admitting: Emergency Medicine

## 2015-03-08 ENCOUNTER — Encounter (HOSPITAL_COMMUNITY): Payer: Self-pay | Admitting: *Deleted

## 2015-03-08 DIAGNOSIS — Z79899 Other long term (current) drug therapy: Secondary | ICD-10-CM | POA: Diagnosis not present

## 2015-03-08 DIAGNOSIS — W458XXA Other foreign body or object entering through skin, initial encounter: Secondary | ICD-10-CM | POA: Diagnosis not present

## 2015-03-08 DIAGNOSIS — M795 Residual foreign body in soft tissue: Secondary | ICD-10-CM

## 2015-03-08 DIAGNOSIS — Z85828 Personal history of other malignant neoplasm of skin: Secondary | ICD-10-CM | POA: Insufficient documentation

## 2015-03-08 DIAGNOSIS — S60551A Superficial foreign body of right hand, initial encounter: Secondary | ICD-10-CM

## 2015-03-08 DIAGNOSIS — Y9289 Other specified places as the place of occurrence of the external cause: Secondary | ICD-10-CM | POA: Diagnosis not present

## 2015-03-08 DIAGNOSIS — I10 Essential (primary) hypertension: Secondary | ICD-10-CM | POA: Insufficient documentation

## 2015-03-08 DIAGNOSIS — E119 Type 2 diabetes mellitus without complications: Secondary | ICD-10-CM | POA: Insufficient documentation

## 2015-03-08 DIAGNOSIS — S61441A Puncture wound with foreign body of right hand, initial encounter: Secondary | ICD-10-CM | POA: Insufficient documentation

## 2015-03-08 DIAGNOSIS — Y9389 Activity, other specified: Secondary | ICD-10-CM | POA: Insufficient documentation

## 2015-03-08 DIAGNOSIS — Z7984 Long term (current) use of oral hypoglycemic drugs: Secondary | ICD-10-CM | POA: Insufficient documentation

## 2015-03-08 DIAGNOSIS — Y998 Other external cause status: Secondary | ICD-10-CM | POA: Insufficient documentation

## 2015-03-08 MED ORDER — CLINDAMYCIN HCL 150 MG PO CAPS
300.0000 mg | ORAL_CAPSULE | Freq: Once | ORAL | Status: AC
  Administered 2015-03-08: 300 mg via ORAL
  Filled 2015-03-08: qty 2

## 2015-03-08 MED ORDER — HYDROCODONE-ACETAMINOPHEN 5-325 MG PO TABS: 2.0000 | ORAL_TABLET | ORAL | Status: DC | PRN

## 2015-03-08 MED ORDER — LIDOCAINE HCL (PF) 1 % IJ SOLN
5.0000 mL | Freq: Once | INTRAMUSCULAR | Status: AC
  Administered 2015-03-08: 5 mL
  Filled 2015-03-08: qty 5

## 2015-03-08 MED ORDER — CLINDAMYCIN HCL 150 MG PO CAPS: 300.0000 mg | ORAL_CAPSULE | Freq: Three times a day (TID) | ORAL | Status: DC

## 2015-03-08 MED ORDER — HYDROCODONE-ACETAMINOPHEN 5-325 MG PO TABS: 1.0000 | ORAL_TABLET | ORAL | Status: DC | PRN

## 2015-03-08 NOTE — ED Provider Notes (Signed)
CSN: [id number]     Arrival date & time 03/08/15  2118 History   First MD Initiated Contact with Patient 03/08/15 2137     Chief Complaint  Patient presents with  . Foreign Body     (Consider location/radiation/quality/duration/timing/severity/associated sxs/prior Treatment) HPI Patrick Phillips is a 75 y.o. male who presents to the ED with a splinter to the right hand with swelling, redness and tenderness. Injury occurred 3 days ago. Patient reports falling on a piece of wood and it stuck in his and. Patient states he tried to pull it out but it just splintered. Now the area is very tender and looks infected. Patient is a diabetic. Up to date on tetanus.   Past Medical History  Diagnosis Date  . Hypertension   . Diabetes mellitus without complication ([place])   . Cancer [facility])     skin cancer   Past Surgical History  Procedure Laterality Date  . Hernia repair    . Back surgery     Family History  Problem Relation Age of Onset  . Cancer Mother   . Cancer Father   . Cancer Brother   . Cancer Other    Social History  Substance Use Topics  . Smoking status: Never Smoker   . Smokeless tobacco: Never Used  . Alcohol Use: Yes     Comment: occ    Review of Systems Negative except as stated in HPI   Allergies  Review of patient's allergies indicates no known allergies.  Home Medications   Prior to Admission medications   Medication Sig Start Date End Date Taking? Authorizing Provider  amLODipine (NORVASC) 5 MG tablet Take 5 mg by mouth daily. 02/16/15  Yes Historical Provider, MD  losartan (COZAAR) 50 MG tablet Take 50 mg by mouth daily. 02/16/15  Yes Historical Provider, MD  metFORMIN (GLUCOPHAGE) 500 MG tablet Take 1,000 mg by mouth 2 (two) times daily. 07/31/13  Yes Historical Provider, MD  benzonatate (TESSALON PERLES) 100 MG capsule Take 1 capsule (100 mg total) by mouth 3 (three) times daily as needed for cough. Patient not taking: Reported on 03/08/2015 02/23/15   Delice Bison  Ward, DO  doxycycline (VIBRAMYCIN) 100 MG capsule Take 1 capsule (100 mg total) by mouth 2 (two) times daily. Patient not taking: Reported on 03/08/2015 02/23/15   Kristen N Ward, DO   BP 148/59 mmHg  Pulse 57  Temp(Src) 98.3 F (36.8 C) (Temporal)  Resp 16  Ht 6' (1.829 m)  Wt 87.544 kg  BMI 26.17 kg/m2  SpO2 99% Physical Exam  Constitutional: He is oriented to person, place, and time. He appears well-developed and well-nourished.  HENT:  Head: Normocephalic.  Eyes: Conjunctivae and EOM are normal.  Neck: Neck supple.  Cardiovascular: Normal rate.   Pulmonary/Chest: Effort normal.  Abdominal: He exhibits no distension.  Musculoskeletal: Normal range of motion.       Right hand: He exhibits tenderness. Normal sensation noted. Normal strength noted.       Hands: There is a puncture wound noted to the palmar aspect of the right hand. There is surrounding erythema and and tenderness. No red streaking noted.   Neurological: He is alert and oriented to person, place, and time. No cranial nerve deficit.  Skin: Skin is warm and dry.  Psychiatric: He has a normal mood and affect. His behavior is normal.  Nursing note and vitals reviewed.   ED Course  Procedures (including critical care time) INCISION AND DRAINAGE Performed by: Patrick Phillips Consent: Verbal  consent obtained. Risks and benefits: risks, benefits and alternatives were discussed  Type: foreign body with infection  Wound cleaned with Betadine  Body area: palm right hand  Anesthesia: local infiltration  Local anesthetic: lidocaine 1% without epinephrine  Anesthetic total: 3 ml  Incision made with #11 blade  Using hemostat 0.7 cm piece of wood removed from the palm of the right hand  Drainage: purulent  Drainage amount: small  Wound culture obtained  Hand soaked in NSS with betadine and peroxide  Patient tolerance: Patient tolerated the procedure well with no immediate complications.  Sterile dressing  applied  Clindamycin 300 mg PO given  Hydrocodone for pain.     Labs Review Labs Reviewed - No data to display  Imaging Review Dg Hand Complete Right  03/08/2015  CLINICAL DATA:  75 year old male with concern for a wood splinter in the palm of the right hand. EXAM: RIGHT HAND - COMPLETE 3+ VIEW COMPARISON:  Radiograph dated 03/08/2015 FINDINGS: There is no acute fracture or dislocation. 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 There is soft tissue swelling over the thenar aspect of the hole in the region of the clinical concern. No radiopaque foreign object identified. IMPRESSION: No radiopaque foreign object identified. Electronically Signed   By: Anner Crete M.D.   On: 03/08/2015 22:39    MDM  75 y.o. male with foreign body to the right hand that happened 3 days ago. Stable for d/c with foreign body removed. Patient started on antibiotics and pain medication. He will return for any problems.   Final diagnoses:  Foreign body (FB) in soft tissue       Ashley Murrain, NP 03/08/15 [place], MD 03/10/15 [phone]

## 2015-03-08 NOTE — ED Notes (Signed)
Pt reporting splinter imbedded in right hand.  Some swelling noted to palm.

## 2015-03-08 NOTE — ED Notes (Signed)
Right hand soaking in betadine, saline and peroxide

## 2015-03-11 LAB — WOUND CULTURE: CULTURE: NORMAL

## 2015-03-16 MED FILL — Hydrocodone-Acetaminophen Tab 5-325 MG: ORAL | Qty: 6 | Status: AC

## 2015-05-09 ENCOUNTER — Emergency Department (HOSPITAL_COMMUNITY)
Admission: EM | Admit: 2015-05-09 | Discharge: 2015-05-10 | Disposition: A | Discharge status: Home / Self Care | Payer: Medicare Other | Attending: Emergency Medicine | Admitting: Emergency Medicine

## 2015-05-09 ENCOUNTER — Encounter (HOSPITAL_COMMUNITY): Payer: Self-pay | Admitting: *Deleted

## 2015-05-09 ENCOUNTER — Emergency Department (HOSPITAL_COMMUNITY): Payer: Medicare Other

## 2015-05-09 DIAGNOSIS — Z79899 Other long term (current) drug therapy: Secondary | ICD-10-CM | POA: Diagnosis not present

## 2015-05-09 DIAGNOSIS — Y999 Unspecified external cause status: Secondary | ICD-10-CM | POA: Insufficient documentation

## 2015-05-09 DIAGNOSIS — S6991XA Unspecified injury of right wrist, hand and finger(s), initial encounter: Secondary | ICD-10-CM | POA: Insufficient documentation

## 2015-05-09 DIAGNOSIS — Z7984 Long term (current) use of oral hypoglycemic drugs: Secondary | ICD-10-CM | POA: Diagnosis not present

## 2015-05-09 DIAGNOSIS — W228XXA Striking against or struck by other objects, initial encounter: Secondary | ICD-10-CM | POA: Diagnosis not present

## 2015-05-09 DIAGNOSIS — I10 Essential (primary) hypertension: Secondary | ICD-10-CM | POA: Insufficient documentation

## 2015-05-09 DIAGNOSIS — E119 Type 2 diabetes mellitus without complications: Secondary | ICD-10-CM | POA: Insufficient documentation

## 2015-05-09 DIAGNOSIS — Y9389 Activity, other specified: Secondary | ICD-10-CM | POA: Diagnosis not present

## 2015-05-09 DIAGNOSIS — Y929 Unspecified place or not applicable: Secondary | ICD-10-CM | POA: Insufficient documentation

## 2015-05-09 DIAGNOSIS — Z85828 Personal history of other malignant neoplasm of skin: Secondary | ICD-10-CM | POA: Insufficient documentation

## 2015-05-09 DIAGNOSIS — M79644 Pain in right finger(s): Secondary | ICD-10-CM | POA: Diagnosis present

## 2015-05-09 MED ORDER — IBUPROFEN 400 MG PO TABS
400.0000 mg | ORAL_TABLET | Freq: Once | ORAL | Status: DC
  Filled 2015-05-09: qty 1

## 2015-05-09 MED ORDER — IBUPROFEN 400 MG PO TABS
ORAL_TABLET | ORAL | Status: AC
  Filled 2015-05-09: qty 1

## 2015-05-09 MED ORDER — IBUPROFEN 400 MG PO TABS
600.0000 mg | ORAL_TABLET | Freq: Once | ORAL | Status: AC
  Administered 2015-05-09: 600 mg via ORAL

## 2015-05-09 MED ORDER — HYDROCODONE-ACETAMINOPHEN 5-325 MG PO TABS
1.0000 | ORAL_TABLET | ORAL | Status: DC | PRN
Start: 2015-05-09 — End: 2015-11-26

## 2015-05-09 NOTE — ED Provider Notes (Signed)
CSN: [id number]     Arrival date & time 05/09/15  2100 History   First MD Initiated Contact with Patient 05/09/15 2132     Chief Complaint  Patient presents with  . thumb injury      (Consider location/radiation/quality/duration/timing/severity/associated sxs/prior Treatment) HPI Patrick Phillips is a 75 y.o. male who presents to the ED with right thumb pain that started yesterday. He reports he was busting wood and a piece of wood came back and hit his right thumb. He continues to have pain and swelling to the area. Patient reports that he can bend his thumb but it gets caught and then he has to use his other hand to straighten it back out and then he feels a pop.   Past Medical History  Diagnosis Date  . Hypertension   . Diabetes mellitus without complication ([place])   . Cancer [facility])     skin cancer   Past Surgical History  Procedure Laterality Date  . Hernia repair    . Back surgery     Family History  Problem Relation Age of Onset  . Cancer Mother   . Cancer Father   . Cancer Brother   . Cancer Other    Social History  Substance Use Topics  . Smoking status: Never Smoker   . Smokeless tobacco: Never Used  . Alcohol Use: Yes     Comment: occ    Review of Systems Negative except as stated in HPI and PMH.   Allergies  Review of patient's allergies indicates no known allergies.  Home Medications   Prior to Admission medications   Medication Sig Start Date End Date Taking? Authorizing Provider  amLODipine (NORVASC) 5 MG tablet Take 5 mg by mouth daily. 02/16/15  Yes Historical Provider, MD  losartan (COZAAR) 50 MG tablet Take 50 mg by mouth daily. 02/16/15  Yes Historical Provider, MD  metFORMIN (GLUCOPHAGE) 500 MG tablet Take 1,000 mg by mouth 2 (two) times daily. 07/31/13  Yes Historical Provider, MD  benzonatate (TESSALON PERLES) 100 MG capsule Take 1 capsule (100 mg total) by mouth 3 (three) times daily as needed for cough. Patient not taking: Reported on 03/08/2015  02/23/15   Delice Bison Ward, DO  HYDROcodone-acetaminophen (NORCO/VICODIN) 5-325 MG tablet Take 1 tablet by mouth every 4 (four) hours as needed. 05/09/15   Hope Bunnie Pion, NP   BP 149/68 mmHg  Pulse 60  Temp(Src) 97.5 F (36.4 C) (Oral)  Resp 20  Ht 6' (1.829 m)  Wt 87.544 kg  BMI 26.17 kg/m2  SpO2 98% Physical Exam  Constitutional: He is oriented to person, place, and time. He appears well-developed and well-nourished.  HENT:  Head: Normocephalic.  Eyes: EOM are normal.  Neck: Neck supple.  Cardiovascular: Normal rate.   Pulmonary/Chest: Effort normal.  Musculoskeletal:       Right hand: He exhibits tenderness and swelling. He exhibits normal capillary refill, no deformity and no laceration. Normal sensation noted. Normal strength noted.  Radial pulse 2+, adequate circulation, good touch sensation. When the patient bends the thumb at the distal aspect there is a pop. He reports sometimes the thumb goes down and will not come back up unless he uses the other hand to pull it up.   Neurological: He is alert and oriented to person, place, and time. No cranial nerve deficit.  Skin: Skin is warm and dry.  No open wounds  Psychiatric: He has a normal mood and affect. His behavior is normal.  Nursing note and  vitals reviewed.   ED Course  Procedures (including critical care time) Motrin PO, x-ray,  Dr. Oleta Mouse in to examine the patient.  Labs Review Labs Reviewed - No data to display Dg Finger Thumb Right  05/10/2015  CLINICAL DATA:  Right thumb injury yesterday.  Initial encounter. EXAM: RIGHT THUMB 2+V COMPARISON:  03/08/2015 FINDINGS: There is no evidence of fracture or dislocation. Soft tissues are unremarkable Degenerative spurring and joint narrowing at the first [place], MCP and IP joints. IMPRESSION: No acute finding. Electronically Signed   By: Monte Fantasia M.D.   On: 05/10/2015 00:05     MDM  75 y.o. male with injury to the right thumb yesterday with continued pain and swelling and  abnormal movement of the distal aspect of the thumb. Stable for d/c without fracture noted on x-ray. Most likely extensor tendon injury. Patient placed in thumb spica splint, pain management and will follow up with Dr. Burney Gauze.   Final diagnoses:  Thumb injury, right, initial encounter        [facility], NP 05/10/15 0159  Forde Dandy, MD 05/10/15 [phone]

## 2015-05-09 NOTE — ED Notes (Signed)
Xray Right thumb in process

## 2015-05-09 NOTE — ED Notes (Signed)
Pt states he was busting wood and the piece of wood came back and hit his right thumb. This happened yesterday.

## 2015-05-09 NOTE — Discharge Instructions (Signed)
From your exam it appears that you have injured the tendon in your thumb. You will need to follow up with Dr. Burney Gauze, the hand surgeon, for further evaluation to see if you will need surgery.  Take the medication as needed for pain but do not take the narcotic if driving as it will make you sleepy. You may take ibuprofen in addition to the narcotic. Wear the splint to protect the tendon until you follow up with Dr. Burney Gauze.

## 2015-05-09 NOTE — ED Notes (Addendum)
Pt updated on plan of care, xray results still pending, contacted xray for eta of results, advised that they would return call to RN

## 2015-05-10 NOTE — ED Notes (Signed)
Pt contact number [phone]

## 2015-07-19 ENCOUNTER — Encounter (HOSPITAL_COMMUNITY): Payer: Self-pay | Admitting: *Deleted

## 2015-07-19 ENCOUNTER — Emergency Department (HOSPITAL_COMMUNITY)
Admission: EM | Admit: 2015-07-19 | Discharge: 2015-07-19 | Disposition: A | Discharge status: Home / Self Care | Payer: Medicare Other | Attending: Dermatology | Admitting: Dermatology

## 2015-07-19 DIAGNOSIS — M79605 Pain in left leg: Secondary | ICD-10-CM | POA: Insufficient documentation

## 2015-07-19 DIAGNOSIS — R0602 Shortness of breath: Secondary | ICD-10-CM | POA: Diagnosis not present

## 2015-07-19 DIAGNOSIS — I10 Essential (primary) hypertension: Secondary | ICD-10-CM | POA: Insufficient documentation

## 2015-07-19 DIAGNOSIS — R51 Headache: Secondary | ICD-10-CM | POA: Diagnosis present

## 2015-07-19 DIAGNOSIS — Z5321 Procedure and treatment not carried out due to patient leaving prior to being seen by health care provider: Secondary | ICD-10-CM | POA: Insufficient documentation

## 2015-07-19 DIAGNOSIS — E119 Type 2 diabetes mellitus without complications: Secondary | ICD-10-CM | POA: Insufficient documentation

## 2015-07-19 DIAGNOSIS — M79604 Pain in right leg: Secondary | ICD-10-CM | POA: Diagnosis not present

## 2015-07-19 LAB — CBC WITH DIFFERENTIAL/PLATELET
Basophils Absolute: 0 10*3/uL (ref 0.0–0.1)
Basophils Relative: 0 %
EOS ABS: 0.4 10*3/uL (ref 0.0–0.7)
EOS PCT: 4 %
HCT: 38.9 % — ABNORMAL LOW (ref 39.0–52.0)
Hemoglobin: 12.8 g/dL — ABNORMAL LOW (ref 13.0–17.0)
LYMPHS ABS: 3.9 10*3/uL (ref 0.7–4.0)
LYMPHS PCT: 39 %
MCH: 28.7 pg (ref 26.0–34.0)
MCHC: 32.9 g/dL (ref 30.0–36.0)
MCV: 87.2 fL (ref 78.0–100.0)
MONO ABS: 0.6 10*3/uL (ref 0.1–1.0)
MONOS PCT: 6 %
Neutro Abs: 5.1 10*3/uL (ref 1.7–7.7)
Neutrophils Relative %: 51 %
PLATELETS: 228 10*3/uL (ref 150–400)
RBC: 4.46 MIL/uL (ref 4.22–5.81)
RDW: 13.2 % (ref 11.5–15.5)
WBC: 9.9 10*3/uL (ref 4.0–10.5)

## 2015-07-19 LAB — COMPREHENSIVE METABOLIC PANEL
ALT: 16 U/L — AB (ref 17–63)
ANION GAP: 6 (ref 5–15)
AST: 16 U/L (ref 15–41)
Albumin: 3.7 g/dL (ref 3.5–5.0)
Alkaline Phosphatase: 64 U/L (ref 38–126)
BUN: 24 mg/dL — ABNORMAL HIGH (ref 6–20)
CALCIUM: 9.5 mg/dL (ref 8.9–10.3)
CHLORIDE: 107 mmol/L (ref 101–111)
CO2: 24 mmol/L (ref 22–32)
CREATININE: 1.1 mg/dL (ref 0.61–1.24)
Glucose, Bld: 143 mg/dL — ABNORMAL HIGH (ref 65–99)
Potassium: 3.9 mmol/L (ref 3.5–5.1)
SODIUM: 137 mmol/L (ref 135–145)
Total Bilirubin: 0.3 mg/dL (ref 0.3–1.2)
Total Protein: 6.6 g/dL (ref 6.5–8.1)

## 2015-07-19 NOTE — ED Notes (Addendum)
Pt reports headache since yesterday and bilateral leg pain. Pt reports feeling sob at times.

## 2015-07-19 NOTE — ED Notes (Signed)
Pt left facility per registration. 

## 2015-11-16 ENCOUNTER — Other Ambulatory Visit: Payer: Self-pay | Admitting: Orthopedic Surgery

## 2015-11-26 ENCOUNTER — Encounter (HOSPITAL_BASED_OUTPATIENT_CLINIC_OR_DEPARTMENT_OTHER): Payer: Self-pay | Admitting: *Deleted

## 2015-11-30 ENCOUNTER — Encounter (HOSPITAL_BASED_OUTPATIENT_CLINIC_OR_DEPARTMENT_OTHER)
Admission: RE | Admit: 2015-11-30 | Discharge: 2015-11-30 | Disposition: A | Discharge status: Home / Self Care | Payer: Medicare Other | Source: Ambulatory Visit | Attending: Orthopedic Surgery | Admitting: Orthopedic Surgery

## 2015-11-30 DIAGNOSIS — M65311 Trigger thumb, right thumb: Secondary | ICD-10-CM | POA: Diagnosis present

## 2015-11-30 DIAGNOSIS — I10 Essential (primary) hypertension: Secondary | ICD-10-CM | POA: Diagnosis not present

## 2015-11-30 DIAGNOSIS — Z79899 Other long term (current) drug therapy: Secondary | ICD-10-CM | POA: Diagnosis not present

## 2015-11-30 DIAGNOSIS — E119 Type 2 diabetes mellitus without complications: Secondary | ICD-10-CM | POA: Diagnosis not present

## 2015-11-30 DIAGNOSIS — Z7984 Long term (current) use of oral hypoglycemic drugs: Secondary | ICD-10-CM | POA: Diagnosis not present

## 2015-11-30 LAB — BASIC METABOLIC PANEL
ANION GAP: 6 (ref 5–15)
BUN: 15 mg/dL (ref 6–20)
CALCIUM: 9.8 mg/dL (ref 8.9–10.3)
CO2: 27 mmol/L (ref 22–32)
CREATININE: 1.11 mg/dL (ref 0.61–1.24)
Chloride: 107 mmol/L (ref 101–111)
Glucose, Bld: 134 mg/dL — ABNORMAL HIGH (ref 65–99)
Potassium: 4.3 mmol/L (ref 3.5–5.1)
SODIUM: 140 mmol/L (ref 135–145)

## 2015-11-30 NOTE — Progress Notes (Signed)
DR. Lamarr Lulas reviewed EKG, will proceed with surgery

## 2015-12-02 ENCOUNTER — Ambulatory Visit (HOSPITAL_BASED_OUTPATIENT_CLINIC_OR_DEPARTMENT_OTHER): Payer: Medicare Other | Admitting: Anesthesiology

## 2015-12-02 ENCOUNTER — Ambulatory Visit (HOSPITAL_BASED_OUTPATIENT_CLINIC_OR_DEPARTMENT_OTHER)
Admission: RE | Admit: 2015-12-02 | Discharge: 2015-12-02 | Disposition: A | Discharge status: Home / Self Care | Payer: Medicare Other | Source: Ambulatory Visit | Attending: Orthopedic Surgery | Admitting: Orthopedic Surgery

## 2015-12-02 ENCOUNTER — Encounter (HOSPITAL_BASED_OUTPATIENT_CLINIC_OR_DEPARTMENT_OTHER): Payer: Self-pay

## 2015-12-02 ENCOUNTER — Encounter (HOSPITAL_BASED_OUTPATIENT_CLINIC_OR_DEPARTMENT_OTHER)
Admission: RE | Disposition: A | Discharge status: Home / Self Care | Payer: Self-pay | Source: Ambulatory Visit | Attending: Orthopedic Surgery

## 2015-12-02 DIAGNOSIS — Z7984 Long term (current) use of oral hypoglycemic drugs: Secondary | ICD-10-CM | POA: Insufficient documentation

## 2015-12-02 DIAGNOSIS — I10 Essential (primary) hypertension: Secondary | ICD-10-CM | POA: Insufficient documentation

## 2015-12-02 DIAGNOSIS — E119 Type 2 diabetes mellitus without complications: Secondary | ICD-10-CM | POA: Insufficient documentation

## 2015-12-02 DIAGNOSIS — Z79899 Other long term (current) drug therapy: Secondary | ICD-10-CM | POA: Insufficient documentation

## 2015-12-02 DIAGNOSIS — M65311 Trigger thumb, right thumb: Secondary | ICD-10-CM | POA: Insufficient documentation

## 2015-12-02 HISTORY — PX: TRIGGER FINGER RELEASE: SHX641

## 2015-12-02 LAB — GLUCOSE, CAPILLARY
GLUCOSE-CAPILLARY: 129 mg/dL — AB (ref 65–99)
Glucose-Capillary: 114 mg/dL — ABNORMAL HIGH (ref 65–99)

## 2015-12-02 SURGERY — RELEASE, A1 PULLEY, FOR TRIGGER FINGER
Anesthesia: Monitor Anesthesia Care | Site: Hand | Laterality: Right

## 2015-12-02 MED ORDER — FENTANYL CITRATE (PF) 100 MCG/2ML IJ SOLN: 50.0000 ug | INTRAMUSCULAR | Status: DC | PRN

## 2015-12-02 MED ORDER — CEFAZOLIN SODIUM-DEXTROSE 2-4 GM/100ML-% IV SOLN
2.0000 g | INTRAVENOUS | Status: AC
  Administered 2015-12-02: 2 g via INTRAVENOUS

## 2015-12-02 MED ORDER — LACTATED RINGERS IV SOLN
INTRAVENOUS | Status: DC
  Administered 2015-12-02: 11:00:00 via INTRAVENOUS

## 2015-12-02 MED ORDER — FENTANYL CITRATE (PF) 100 MCG/2ML IJ SOLN: 25.0000 ug | INTRAMUSCULAR | Status: DC | PRN

## 2015-12-02 MED ORDER — PROMETHAZINE HCL 25 MG/ML IJ SOLN: 6.2500 mg | INTRAMUSCULAR | Status: DC | PRN

## 2015-12-02 MED ORDER — LIDOCAINE 2% (20 MG/ML) 5 ML SYRINGE
INTRAMUSCULAR | Status: AC
  Filled 2015-12-02: qty 5

## 2015-12-02 MED ORDER — LIDOCAINE HCL (PF) 0.5 % IJ SOLN
INTRAMUSCULAR | Status: DC | PRN
  Administered 2015-12-02: 30 mL via INTRAVENOUS

## 2015-12-02 MED ORDER — SCOPOLAMINE 1 MG/3DAYS TD PT72: 1.0000 | MEDICATED_PATCH | Freq: Once | TRANSDERMAL | Status: DC | PRN

## 2015-12-02 MED ORDER — CHLORHEXIDINE GLUCONATE 4 % EX LIQD: 60.0000 mL | Freq: Once | CUTANEOUS | Status: DC

## 2015-12-02 MED ORDER — BUPIVACAINE HCL (PF) 0.25 % IJ SOLN
INTRAMUSCULAR | Status: AC
  Filled 2015-12-02: qty 30

## 2015-12-02 MED ORDER — BUPIVACAINE HCL (PF) 0.25 % IJ SOLN
INTRAMUSCULAR | Status: DC | PRN
  Administered 2015-12-02: 10 mL

## 2015-12-02 MED ORDER — PROPOFOL 500 MG/50ML IV EMUL
INTRAVENOUS | Status: DC | PRN
  Administered 2015-12-02: 25 ug/kg/min via INTRAVENOUS

## 2015-12-02 MED ORDER — FENTANYL CITRATE (PF) 100 MCG/2ML IJ SOLN
INTRAMUSCULAR | Status: DC | PRN
  Administered 2015-12-02 (×2): 50 ug via INTRAVENOUS

## 2015-12-02 MED ORDER — GLYCOPYRROLATE 0.2 MG/ML IJ SOLN: 0.2000 mg | Freq: Once | INTRAMUSCULAR | Status: DC | PRN

## 2015-12-02 MED ORDER — CEFAZOLIN SODIUM-DEXTROSE 2-4 GM/100ML-% IV SOLN
INTRAVENOUS | Status: AC
  Filled 2015-12-02: qty 100

## 2015-12-02 MED ORDER — PROPOFOL 500 MG/50ML IV EMUL
INTRAVENOUS | Status: AC
  Filled 2015-12-02: qty 50

## 2015-12-02 MED ORDER — MIDAZOLAM HCL 2 MG/2ML IJ SOLN
INTRAMUSCULAR | Status: AC
  Filled 2015-12-02: qty 2

## 2015-12-02 MED ORDER — HYDROCODONE-ACETAMINOPHEN 7.5-325 MG PO TABS: 1.0000 | ORAL_TABLET | Freq: Once | ORAL | Status: DC | PRN

## 2015-12-02 MED ORDER — MIDAZOLAM HCL 2 MG/2ML IJ SOLN: 1.0000 mg | INTRAMUSCULAR | Status: DC | PRN

## 2015-12-02 MED ORDER — FENTANYL CITRATE (PF) 100 MCG/2ML IJ SOLN
INTRAMUSCULAR | Status: AC
  Filled 2015-12-02: qty 2

## 2015-12-02 MED ORDER — HYDROCODONE-ACETAMINOPHEN 5-325 MG PO TABS: ORAL_TABLET | ORAL | 0 refills | Status: DC

## 2015-12-02 SURGICAL SUPPLY — 31 items
BANDAGE COBAN STERILE 2 (GAUZE/BANDAGES/DRESSINGS) ×3 IMPLANT
BLADE SURG 15 STRL LF DISP TIS (BLADE) ×2 IMPLANT
BLADE SURG 15 STRL SS (BLADE) ×6
BNDG CMPR 9X4 STRL LF SNTH (GAUZE/BANDAGES/DRESSINGS) ×1
BNDG CONFORM 2 STRL LF (GAUZE/BANDAGES/DRESSINGS) ×3 IMPLANT
BNDG ESMARK 4X9 LF (GAUZE/BANDAGES/DRESSINGS) ×2 IMPLANT
CHLORAPREP W/TINT 26ML (MISCELLANEOUS) ×3 IMPLANT
CORDS BIPOLAR (ELECTRODE) ×1 IMPLANT
COVER BACK TABLE 60X90IN (DRAPES) ×3 IMPLANT
COVER MAYO STAND STRL (DRAPES) ×3 IMPLANT
CUFF TOURNIQUET SINGLE 18IN (TOURNIQUET CUFF) ×3 IMPLANT
DRAPE EXTREMITY T 121X128X90 (DRAPE) ×3 IMPLANT
DRAPE SURG 17X23 STRL (DRAPES) ×3 IMPLANT
GAUZE SPONGE 4X4 12PLY STRL (GAUZE/BANDAGES/DRESSINGS) ×3 IMPLANT
GAUZE XEROFORM 1X8 LF (GAUZE/BANDAGES/DRESSINGS) ×3 IMPLANT
GLOVE BIO SURGEON STRL SZ7.5 (GLOVE) ×5 IMPLANT
GLOVE BIOGEL PI IND STRL 8 (GLOVE) ×1 IMPLANT
GLOVE BIOGEL PI INDICATOR 8 (GLOVE) ×4
GOWN STRL REIN XL XLG (GOWN DISPOSABLE) ×5 IMPLANT
GOWN STRL REUS W/ TWL LRG LVL3 (GOWN DISPOSABLE) ×1 IMPLANT
GOWN STRL REUS W/TWL LRG LVL3 (GOWN DISPOSABLE)
NDL HYPO 25X1 1.5 SAFETY (NEEDLE) ×1 IMPLANT
NEEDLE HYPO 25X1 1.5 SAFETY (NEEDLE) ×3 IMPLANT
NS IRRIG 1000ML POUR BTL (IV SOLUTION) ×3 IMPLANT
PACK BASIN DAY SURGERY FS (CUSTOM PROCEDURE TRAY) ×3 IMPLANT
STOCKINETTE 4X48 STRL (DRAPES) ×3 IMPLANT
SUT ETHILON 4 0 PS 2 18 (SUTURE) ×3 IMPLANT
SYR BULB 3OZ (MISCELLANEOUS) ×3 IMPLANT
SYR CONTROL 10ML LL (SYRINGE) ×3 IMPLANT
TOWEL OR 17X24 6PK STRL BLUE (TOWEL DISPOSABLE) ×6 IMPLANT
UNDERPAD 30X30 (UNDERPADS AND DIAPERS) ×3 IMPLANT

## 2015-12-02 NOTE — Anesthesia Preprocedure Evaluation (Addendum)
Anesthesia Evaluation  Patient identified by MRN, date of birth, ID band Patient awake    Reviewed: Allergy & Precautions, NPO status , Patient's Chart, lab work & pertinent test results  Airway Mallampati: II  TM Distance: >3 FB Neck ROM: Full    Dental  (+) Dental Advisory Given   Pulmonary neg pulmonary ROS,    breath sounds clear to auscultation       Cardiovascular hypertension, Pt. on medications  Rhythm:Regular Rate:Normal     Neuro/Psych negative neurological ROS     GI/Hepatic negative GI ROS, Neg liver ROS,   Endo/Other  diabetes, Type 2, Oral Hypoglycemic Agents  Renal/GU negative Renal ROS     Musculoskeletal   Abdominal   Peds  Hematology negative hematology ROS (+)   Anesthesia Other Findings   Reproductive/Obstetrics                             Anesthesia Physical Anesthesia Plan  ASA: II  Anesthesia Plan: MAC and Bier Block   Post-op Pain Management:    Induction: Intravenous  Airway Management Planned: Natural Airway and Simple Face Mask  Additional Equipment:   Intra-op Plan:   Post-operative Plan: Extubation in OR  Informed Consent: I have reviewed the patients History and Physical, chart, labs and discussed the procedure including the risks, benefits and alternatives for the proposed anesthesia with the patient or authorized representative who has indicated his/her understanding and acceptance.   Dental advisory given  Plan Discussed with: CRNA  Anesthesia Plan Comments:        Anesthesia Quick Evaluation

## 2015-12-02 NOTE — Anesthesia Postprocedure Evaluation (Signed)
Anesthesia Post Note  Patient: [employer]  Procedure(s) Performed: Procedure(s) (LRB): RIGHT THUMB RELEASE TRIGGER FINGER/A-1 PULLEY (Right)  Patient location during evaluation: PACU Anesthesia Type: MAC and Bier Block Level of consciousness: awake and alert Pain management: pain level controlled Vital Signs Assessment: post-procedure vital signs reviewed and stable Respiratory status: spontaneous breathing, nonlabored ventilation, respiratory function stable and patient connected to nasal cannula oxygen Cardiovascular status: stable and blood pressure returned to baseline Anesthetic complications: no    Last Vitals:  Vitals:   12/02/15 1345 12/02/15 1404  BP: (!) 160/71 (!) 158/69  Pulse: (!) 53 62  Resp: 14 18  Temp:  36.4 C    Last Pain:  Vitals:   12/02/15 1404  TempSrc:   PainSc: 0-No pain                 Tiajuana Amass

## 2015-12-02 NOTE — Transfer of Care (Signed)
Immediate Anesthesia Transfer of Care Note  Patient: Patrick Phillips  Procedure(s) Performed: Procedure(s): RIGHT THUMB RELEASE TRIGGER FINGER/A-1 PULLEY (Right)  Patient Location: PACU  Anesthesia Type:Bier block  Level of Consciousness: awake and patient cooperative  Airway & Oxygen Therapy: Patient Spontanous Breathing and Patient connected to face mask oxygen  Post-op Assessment: Report given to RN and Post -op Vital signs reviewed and stable  Post vital signs: Reviewed and stable  Last Vitals:  Vitals:   12/02/15 1034 12/02/15 1328  BP: (!) 178/64   Pulse: (!) 47 (!) 59  Resp: 18 (!) 23  Temp: 36.5 C     Last Pain:  Vitals:   12/02/15 1034  TempSrc: Oral  PainSc: 9       Patients Stated Pain Goal: 3 ([id number])  Complications: No apparent anesthesia complications

## 2015-12-02 NOTE — H&P (Signed)
  Patrick Phillips is an 75 y.o. male.   Chief Complaint: right trigger thumb HPI: 75 yo male with triggering right thumb.  Improves with injections but recurs.  He wishes to have a trigger release.  Allergies: No Known Allergies  Past Medical History:  Diagnosis Date  . Cancer ([place])    skin cancer  . Diabetes mellitus without complication ([place])   . Hypertension     Past Surgical History:  Procedure Laterality Date  . BACK SURGERY    . HERNIA REPAIR      Family History: Family History  Problem Relation Age of Onset  . Cancer Mother   . Cancer Father   . Cancer Brother   . Cancer Other     Social History:   reports that he has never smoked. He has never used smokeless tobacco. He reports that he drinks alcohol. He reports that he does not use drugs.  Medications: Medications Prior to Admission  Medication Sig Dispense Refill  . acetaminophen (TYLENOL) 500 MG tablet Take 1,000 mg by mouth every 6 (six) hours as needed.    Marland Kitchen amLODipine (NORVASC) 5 MG tablet Take 5 mg by mouth daily.    Marland Kitchen losartan (COZAAR) 50 MG tablet Take 50 mg by mouth daily.    . metFORMIN (GLUCOPHAGE) 500 MG tablet Take 1,000 mg by mouth 2 (two) times daily.      Results for orders placed or performed during the hospital encounter of 12/02/15 (from the past 48 hour(s))  Glucose, capillary     Status: Abnormal   Collection Time: 12/02/15 10:58 AM  Result Value Ref Range   Glucose-Capillary 114 (H) 65 - 99 mg/dL    No results found.   A comprehensive review of systems was negative.  Blood pressure (!) 178/64, pulse (!) 47, temperature 97.7 F (36.5 C), temperature source Oral, resp. rate 18, height 6' (1.829 m), weight 86 kg (189 lb 8 oz), SpO2 99 %.  General appearance: alert, cooperative and appears stated age Head: Normocephalic, without obvious abnormality, atraumatic Neck: supple, symmetrical, trachea midline Resp: clear to auscultation bilaterally Cardio: regular rate and rhythm GI:  abdomen soft Extremities: Intact sensation and capillary refill all digits.  +epl/fpl/io.  No wounds.  Pulses: 2+ and symmetric Skin: Skin color, texture, turgor normal. No rashes or lesions Neurologic: Grossly normal Incision/Wound:none  Assessment/Plan Right trigger thumb.  Non operative and operative treatment options were discussed with the patient and patient wishes to proceed with operative treatment. Risks, benefits, and alternatives of surgery were discussed and the patient agrees with the plan of care.   Rajah Lamba R 12/02/2015, 12:34 PM

## 2015-12-02 NOTE — Anesthesia Procedure Notes (Signed)
Procedure Name: MAC Date/Time: 12/02/2015 12:50 PM Performed by: Jamilet Ambroise D Pre-anesthesia Checklist: Patient identified, Timeout performed, Emergency Drugs available, Suction available and Patient being monitored Patient Re-evaluated:Patient Re-evaluated prior to inductionOxygen Delivery Method: Simple face mask Preoxygenation: Pre-oxygenation with 100% oxygen

## 2015-12-02 NOTE — Brief Op Note (Signed)
12/02/2015  1:24 PM  PATIENT:  Patrick Phillips  75 y.o. male  PRE-OPERATIVE DIAGNOSIS:  Right Thumb Trigger    M65.311  POST-OPERATIVE DIAGNOSIS:  Right Thumb Trigger    M65.311  PROCEDURE:  Procedure(s): RIGHT THUMB RELEASE TRIGGER FINGER/A-1 PULLEY (Right)  SURGEON:  Surgeon(s) and Role:    * Leanora Cover, MD - Primary  PHYSICIAN ASSISTANT:   ASSISTANTS: none   ANESTHESIA:   Bier block with sedation  EBL:  Total I/O In: -  Out: 1 [Blood:1]  BLOOD ADMINISTERED:none  DRAINS: none   LOCAL MEDICATIONS USED:  MARCAINE     SPECIMEN:  No Specimen  DISPOSITION OF SPECIMEN:  N/A  COUNTS:  YES  TOURNIQUET:   Total Tourniquet Time Documented: Forearm (Right) - 26 minutes Total: Forearm (Right) - 26 minutes   DICTATION: .Note written in EPIC  PLAN OF CARE: Discharge to home after PACU  PATIENT DISPOSITION:  PACU - hemodynamically stable.

## 2015-12-02 NOTE — Discharge Instructions (Addendum)

## 2015-12-02 NOTE — Op Note (Signed)
12/02/2015 [facility] OPERATIVE REPORT   PREOPERATIVE DIAGNOSIS: Right trigger thumb.  POSTOPERATIVE DIAGNOSIS:  Right trigger thumb.  PROCEDURE: Right trigger thumb release.  SURGEON:  Leanora Cover, MD  ASSISTANT:  none.  ANESTHESIA:  Bier block and sedation.  IV FLUIDS:  Per anesthesia flow sheet.  ESTIMATED BLOOD LOSS:  Minimal.  COMPLICATIONS:  None.  SPECIMENS:  None.  TOURNIQUET TIME:  Total Tourniquet Time Documented: Forearm (Right) - 26 minutes Total: Forearm (Right) - 26 minutes   DISPOSITION:  Stable to PACU.  LOCATION: [facility]  INDICATIONS: Patrick Phillips is a 75 y.o. male with triggering of the right thumb.  Improved with injections, but recurs.  He wishes to have a trigger release.  Risks, benefits and alternatives of surgery were discussed including the risk of blood loss, infection, damage to nerves, vessels, tendons, ligaments, bone, failure of surgery, need for additional surgery, complications with wound healing, continued pain, continued triggering and need for repeat surgery.  He voiced understanding of these risks and elected to proceed.  OPERATIVE COURSE:  After being identified preoperatively by myself, the patient and I agreed upon the procedure and site of procedure.  The surgical site was marked. The risks, benefits, and alternatives of surgery were reviewed and he wished to proceed.  Surgical consent had been signed. He was given IV Ancef as preoperative antibiotic prophylaxis. He was transported to the operating room and placed on the operating room table in supine position with the Right upper extremity on an arm board. Bier block and sedation was induced by the anesthesiologist.  The Right upper extremity was prepped and draped in normal sterile orthopedic fashion. A surgical pause was performed between surgeons, anesthesia, and operating room staff, and all were in agreement as to the patient, procedure, and site of  procedure.  Tourniquet at the proximal aspect of the forearm was inflated for the Bier block.  An incision was made transversely at the MP flexion crease of the thumb.  This was made through the skin only.  Spreading technique was used.  The radial and ulnar digital nerves were identified and were protected throughout the case. The flexor sheath was identified.  The A1 pulley was identified.  It was sharply incised.  It was released in its entirety.  Care was taken to avoid any release of the oblique pulley. The thumb was placed through a range of motion, there was noted to be no catching.  The wound was copiously irrigated with sterile saline. It was then closed with 4-0 nylon in a horizontal mattress fashion.  The wound was injected with 10 mL of 0.25% plain Marcaine to aid in postoperative analgesia.  It was then dressed with sterile Xeroform, 4x4s, and wrapped lightly with a Coban dressing.  Tourniquet was deflated at 26 minutes.  The fingertips were pink with brisk capillary refill after deflation of the tourniquet.  The operative drapes were broken down and the patient was awoken from anesthesia safely.  He was transferred back to the stretcher and taken to the PACU in stable condition.   I will see him back in the office in 1 week for postoperative followup.  I will give him a prescription for Norco 5/325 1-2 tabs po q6 hours prn pain, dispense #20.    Tennis Must, MD Electronically signed, 12/02/15

## 2015-12-03 ENCOUNTER — Encounter (HOSPITAL_BASED_OUTPATIENT_CLINIC_OR_DEPARTMENT_OTHER): Payer: Self-pay | Admitting: Orthopedic Surgery

## 2015-12-22 ENCOUNTER — Emergency Department (HOSPITAL_COMMUNITY): Payer: Medicare Other

## 2015-12-22 ENCOUNTER — Encounter (HOSPITAL_COMMUNITY): Payer: Self-pay | Admitting: Emergency Medicine

## 2015-12-22 ENCOUNTER — Observation Stay (HOSPITAL_COMMUNITY)
Admission: EM | Admit: 2015-12-22 | Discharge: 2015-12-22 | Discharge status: Against Medical Advice | Payer: Medicare Other | Attending: Internal Medicine | Admitting: Internal Medicine

## 2015-12-22 DIAGNOSIS — M549 Dorsalgia, unspecified: Secondary | ICD-10-CM

## 2015-12-22 DIAGNOSIS — R0602 Shortness of breath: Secondary | ICD-10-CM | POA: Insufficient documentation

## 2015-12-22 DIAGNOSIS — I10 Essential (primary) hypertension: Secondary | ICD-10-CM | POA: Diagnosis not present

## 2015-12-22 DIAGNOSIS — R0789 Other chest pain: Secondary | ICD-10-CM | POA: Diagnosis not present

## 2015-12-22 DIAGNOSIS — R001 Bradycardia, unspecified: Secondary | ICD-10-CM | POA: Diagnosis present

## 2015-12-22 DIAGNOSIS — Z79899 Other long term (current) drug therapy: Secondary | ICD-10-CM | POA: Diagnosis not present

## 2015-12-22 DIAGNOSIS — E119 Type 2 diabetes mellitus without complications: Secondary | ICD-10-CM | POA: Insufficient documentation

## 2015-12-22 DIAGNOSIS — R0600 Dyspnea, unspecified: Secondary | ICD-10-CM

## 2015-12-22 DIAGNOSIS — Z7984 Long term (current) use of oral hypoglycemic drugs: Secondary | ICD-10-CM | POA: Insufficient documentation

## 2015-12-22 DIAGNOSIS — R079 Chest pain, unspecified: Secondary | ICD-10-CM | POA: Diagnosis present

## 2015-12-22 DIAGNOSIS — R55 Syncope and collapse: Secondary | ICD-10-CM | POA: Diagnosis not present

## 2015-12-22 DIAGNOSIS — G8929 Other chronic pain: Secondary | ICD-10-CM | POA: Diagnosis present

## 2015-12-22 LAB — TROPONIN I: Troponin I: <0.03 ng/mL (ref <0.03)

## 2015-12-22 LAB — BASIC METABOLIC PANEL
ANION GAP: 7 (ref 5–15)
BUN: 20 mg/dL (ref 6–20)
CO2: 24 mmol/L (ref 22–32)
Calcium: 9.2 mg/dL (ref 8.9–10.3)
Chloride: 106 mmol/L (ref 101–111)
Creatinine, Ser: 1.11 mg/dL (ref 0.61–1.24)
GFR calc Af Amer: >60 mL/min (ref >60)
GFR calc non Af Amer: >60 mL/min (ref >60)
GLUCOSE: 117 mg/dL — AB (ref 65–99)
POTASSIUM: 3.9 mmol/L (ref 3.5–5.1)
Sodium: 137 mmol/L (ref 135–145)

## 2015-12-22 LAB — CBC WITH DIFFERENTIAL/PLATELET
Basophils Absolute: 0 10*3/uL (ref 0.0–0.1)
Basophils Relative: 0 %
Eosinophils Absolute: 0.3 10*3/uL (ref 0.0–0.7)
Eosinophils Relative: 3 %
HEMATOCRIT: 44 % (ref 39.0–52.0)
HEMOGLOBIN: 14.6 g/dL (ref 13.0–17.0)
LYMPHS ABS: 2.5 10*3/uL (ref 0.7–4.0)
Lymphocytes Relative: 25 %
MCH: 29.3 pg (ref 26.0–34.0)
MCHC: 33.2 g/dL (ref 30.0–36.0)
MCV: 88.4 fL (ref 78.0–100.0)
MONOS PCT: 6 %
Monocytes Absolute: 0.7 10*3/uL (ref 0.1–1.0)
NEUTROS ABS: 6.7 10*3/uL (ref 1.7–7.7)
NEUTROS PCT: 66 %
Platelets: 226 10*3/uL (ref 150–400)
RBC: 4.98 MIL/uL (ref 4.22–5.81)
RDW: 13.4 % (ref 11.5–15.5)
WBC: 10.1 10*3/uL (ref 4.0–10.5)

## 2015-12-22 LAB — D-DIMER, QUANTITATIVE: D-Dimer, Quant: 0.68 ug/mL-FEU — ABNORMAL HIGH (ref 0.00–0.50)

## 2015-12-22 MED ORDER — IPRATROPIUM-ALBUTEROL 0.5-2.5 (3) MG/3ML IN SOLN
3.0000 mL | Freq: Once | RESPIRATORY_TRACT | Status: AC
  Administered 2015-12-22: 3 mL via RESPIRATORY_TRACT
  Filled 2015-12-22: qty 3

## 2015-12-22 MED ORDER — IOPAMIDOL (ISOVUE-370) INJECTION 76%
100.0000 mL | Freq: Once | INTRAVENOUS | Status: AC | PRN
  Administered 2015-12-22: 100 mL via INTRAVENOUS

## 2015-12-22 MED ORDER — ALBUTEROL SULFATE (2.5 MG/3ML) 0.083% IN NEBU
2.5000 mg | INHALATION_SOLUTION | Freq: Once | RESPIRATORY_TRACT | Status: AC
  Administered 2015-12-22: 2.5 mg via RESPIRATORY_TRACT
  Filled 2015-12-22: qty 3

## 2015-12-22 NOTE — ED Provider Notes (Signed)
[facility] DEPT Provider Note   CSN: [id number] Arrival date & time: 12/22/15  1201     History   Chief Complaint Chief Complaint  Patient presents with  . Chest Pain  . Loss of Consciousness    HPI Patrick Phillips is a 75 y.o. male.  HPI  Pt was seen at 1230. Per pt, c/o sudden onset and resolution of one episode of syncope that occurred approximately 10am today. Pt states he was sitting in a chair and "just passed out" and "fell on the floor." Pt states he has had intermittent left sided chest "pain" and SOB for the past 2 to 3 days. Describes the pain as "sharp." Has also c/o generalized weakness/fatigue for the past 2 to 3 days. Denies prodromal symptoms before syncopal episode. Denies palpitations, no cough, no abd pain, no back pain, no focal motor weakness, no tingling/numbness in extremities.   Past Medical History:  Diagnosis Date  . Cancer ([place])    skin cancer  . Diabetes mellitus without complication ([place])   . Hypertension     Patient Active Problem List   Diagnosis Date Noted  . Syncope 10/17/2013  . Sinus bradycardia 10/17/2013  . SOB (shortness of breath) 10/17/2013  . Hypertension   . Diabetes mellitus without complication [facility])     Past Surgical History:  Procedure Laterality Date  . BACK SURGERY    . HERNIA REPAIR    . TRIGGER FINGER RELEASE Right 12/02/2015   Procedure: RIGHT THUMB RELEASE TRIGGER FINGER/A-1 PULLEY;  Surgeon: Leanora Cover, MD;  Location: [facility];  Service: Orthopedics;  Laterality: Right;       Home Medications    Prior to Admission medications   Medication Sig Start Date End Date Taking? Authorizing Provider  amLODipine (NORVASC) 5 MG tablet Take 5 mg by mouth daily. 02/16/15  Yes Historical Provider, MD  HYDROcodone-acetaminophen (NORCO) 5-325 MG tablet 1-2 tabs po q6 hours prn pain Patient taking differently: Take 1-2 tablets by mouth every 6 (six) hours as needed for moderate pain. 1-2 tabs po q6 hours prn  pain 12/02/15  Yes Leanora Cover, MD  losartan (COZAAR) 100 MG tablet Take 1 tablet by mouth daily. 10/11/15  Yes Historical Provider, MD  metFORMIN (GLUCOPHAGE) 500 MG tablet Take 1,000 mg by mouth 2 (two) times daily. 07/31/13  Yes Historical Provider, MD  traZODone (DESYREL) 50 MG tablet Take 1 tablet by mouth at bedtime. 10/11/15  Yes Historical Provider, MD    Family History Family History  Problem Relation Age of Onset  . Cancer Mother   . Cancer Father   . Cancer Brother   . Cancer Other     Social History Social History  Substance Use Topics  . Smoking status: Never Smoker  . Smokeless tobacco: Never Used  . Alcohol use Yes     Comment: occ     Allergies   Review of patient's allergies indicates no known allergies.   Review of Systems Review of Systems ROS: Statement: All systems negative except as marked or noted in the HPI; Constitutional: Negative for fever and chills. ; ; Eyes: Negative for eye pain, redness and discharge. ; ; ENMT: Negative for ear pain, hoarseness, nasal congestion, sinus pressure and sore throat. ; ; Cardiovascular: Negative for chest pain, palpitations, diaphoresis, and peripheral edema. ; ; Respiratory: +SOB. Negative for cough, wheezing and stridor. ; ; Gastrointestinal: Negative for nausea, vomiting, diarrhea, abdominal pain, blood in stool, hematemesis, jaundice and rectal bleeding. . ; ; Genitourinary:  Negative for dysuria, flank pain and hematuria. ; ; Musculoskeletal: Negative for back pain and neck pain. Negative for swelling and trauma.; ; Skin: Negative for pruritus, rash, abrasions, blisters, bruising and skin lesion.; ; Neuro: Negative for headache, lightheadedness and neck stiffness. Negative for extremity weakness, paresthesias, involuntary movement, seizure and +generalized weakness, +syncope.     Physical Exam Updated Vital Signs BP (!) 157/48 (BP Location: Left Arm)   Pulse (!) 59   Temp 98.2 F (36.8 C) (Oral)   Resp 12   Ht 6'  (1.829 m)   Wt 189 lb (85.7 kg)   SpO2 99%   BMI 25.63 kg/m    13:19 Orthostatic Vital Signs LB  Orthostatic Lying   BP- Lying: 139/68  Pulse- Lying: 52      Orthostatic Sitting  BP- Sitting: 146/72  Pulse- Sitting: 54      Orthostatic Standing at 0 minutes  BP- Standing at 0 minutes: 141/70  Pulse- Standing at 0 minutes: 56    Physical Exam 1235: Physical examination:  Nursing notes reviewed; Vital signs and O2 SAT reviewed;  Constitutional: Well developed, Well nourished, Well hydrated, In no acute distress; Head:  Normocephalic, atraumatic; Eyes: EOMI, PERRL, No scleral icterus; ENMT: Mouth and pharynx normal, Mucous membranes moist; Neck: Supple, Full range of motion, No lymphadenopathy; Cardiovascular: Bradycardic rate and rhythm, No gallop; Respiratory: Breath sounds diminished & equal bilaterally, No wheezes.  Speaking full sentences with ease, Normal respiratory effort/excursion; Chest: Nontender, Movement normal; Abdomen: Soft, Nontender, Nondistended, Normal bowel sounds; Genitourinary: No CVA tenderness; Extremities: Pulses normal, No tenderness, No edema, No calf edema or asymmetry.; Neuro: AA&Ox3, Major CN grossly intact. No facial droop. Speech clear. Grips equal. Strength 5/5 equal bilat UE's and LE's. No gross focal motor or sensory deficits in extremities.; Skin: Color normal, Warm, Dry.   ED Treatments / Results  Labs (all labs ordered are listed, but only abnormal results are displayed)   EKG  EKG Interpretation  Date/Time:  Wednesday December 22 2015 12:10:46 EDT Ventricular Rate:  53 PR Interval:  178 QRS Duration: 86 QT Interval:  424 QTC Calculation: 397 R Axis:   -29 Text Interpretation:  Sinus bradycardia Left axis deviation Septal infarct , age undetermined When compared with ECG of 11/30/2015 No significant change was found Confirmed by [facility]  MD, Nunzio Cory [phone]) on 12/22/2015 12:41:20 PM       Radiology   Procedures Procedures (including  critical care time)  Medications Ordered in ED Medications  ipratropium-albuterol (DUONEB) 0.5-2.5 (3) MG/3ML nebulizer solution 3 mL (3 mLs Nebulization Given 12/22/15 1326)  albuterol (PROVENTIL) (2.5 MG/3ML) 0.083% nebulizer solution 2.5 mg (2.5 mg Nebulization Given 12/22/15 1325)  iopamidol (ISOVUE-370) 76 % injection 100 mL (100 mLs Intravenous Contrast Given 12/22/15 1359)     Initial Impression / Assessment and Plan / ED Course  I have reviewed the triage vital signs and the nursing notes.  Pertinent labs & imaging results that were available during my care of the patient were reviewed by me and considered in my medical decision making (see chart for details).  MDM Reviewed: previous chart, nursing note and vitals Reviewed previous: labs and ECG Interpretation: labs, ECG, x-ray and CT scan   Results for orders placed or performed during the hospital encounter of [id number]  Basic metabolic panel  Result Value Ref Range   Sodium 137 135 - 145 mmol/L   Potassium 3.9 3.5 - 5.1 mmol/L   Chloride 106 101 - 111 mmol/L   CO2 24  22 - 32 mmol/L   Glucose, Bld 117 (H) 65 - 99 mg/dL   BUN 20 6 - 20 mg/dL   Creatinine, Ser 1.11 0.61 - 1.24 mg/dL   Calcium 9.2 8.9 - 10.3 mg/dL   GFR calc non Af Amer >60 >60 mL/min   GFR calc Af Amer >60 >60 mL/min   Anion gap 7 5 - 15  Troponin I  Result Value Ref Range   Troponin I <0.03 <0.03 ng/mL  CBC with Differential  Result Value Ref Range   WBC 10.1 4.0 - 10.5 K/uL   RBC 4.98 4.22 - 5.81 MIL/uL   Hemoglobin 14.6 13.0 - 17.0 g/dL   HCT 44.0 39.0 - 52.0 %   MCV 88.4 78.0 - 100.0 fL   MCH 29.3 26.0 - 34.0 pg   MCHC 33.2 30.0 - 36.0 g/dL   RDW 13.4 11.5 - 15.5 %   Platelets 226 150 - 400 K/uL   Neutrophils Relative % 66 %   Neutro Abs 6.7 1.7 - 7.7 K/uL   Lymphocytes Relative 25 %   Lymphs Abs 2.5 0.7 - 4.0 K/uL   Monocytes Relative 6 %   Monocytes Absolute 0.7 0.1 - 1.0 K/uL   Eosinophils Relative 3 %   Eosinophils Absolute 0.3  0.0 - 0.7 K/uL   Basophils Relative 0 %   Basophils Absolute 0.0 0.0 - 0.1 K/uL  D-dimer, quantitative  Result Value Ref Range   D-Dimer, Quant 0.68 (H) 0.00 - 0.50 ug/mL-FEU   Dg Chest 2 View Result Date: 12/22/2015 CLINICAL DATA:  LEFT-sided chest pain and upper and mid back pain EXAM: CHEST  2 VIEW COMPARISON:  [id number] FINDINGS: Normal cardiac silhouette. Lungs are hyperinflated. No effusion, infiltrate or pneumothorax. Degenerative osteophytosis of the spine. IMPRESSION: Hyperinflated lungs.  No acute findings Electronically Signed   By: Suzy Bouchard M.D.   On: 12/22/2015 13:09   Ct Head Wo Contrast Result Date: 12/22/2015 CLINICAL DATA:  PATIENT STATES THAT HE PASSED OUT THIS AM WITH CHEST PAIN EXAM: CT HEAD WITHOUT CONTRAST TECHNIQUE: Contiguous axial images were obtained from the base of the skull through the vertex without intravenous contrast. COMPARISON:  10/08/2015 FINDINGS: Brain: No evidence of acute infarction, hemorrhage, hydrocephalus, extra-axial collection or mass lesion/mass effect. Vascular: No hyperdense vessel or unexpected calcification. Skull: Normal. Negative for fracture or focal lesion. Sinuses/Orbits: Mild anterior ethmoid sinus mucosal thickening. Visualize globes and orbits are unremarkable. Other: None. IMPRESSION: 1. No acute intracranial abnormalities. 2. Mild anterior ethmoid sinus mucosal thickening. Electronically Signed   By: Lajean Manes M.D.   On: 12/22/2015 13:17   Ct Angio Chest Pe W/cm &/or Wo Cm Result Date: 12/22/2015 CLINICAL DATA:  Chest pain and recent syncopal event EXAM: CT ANGIOGRAPHY CHEST WITH CONTRAST TECHNIQUE: Multidetector CT imaging of the chest was performed using the standard protocol during bolus administration of intravenous contrast. Multiplanar CT image reconstructions and MIPs were obtained to evaluate the vascular anatomy. CONTRAST:  100 mL Isovue 370 COMPARISON:  Plain film from earlier in the same day FINDINGS: Cardiovascular:  Thoracic aorta shows some atherosclerotic calcifications without aneurysmal dilatation or dissection. The brachiocephalic vessels are within normal limits. The pulmonary artery demonstrates a normal branching pattern. No filling defects to suggest pulmonary emboli are noted. Mediastinum/Nodes: No significant hilar or mediastinal adenopathy is noted. Thoracic inlet is within normal limits. No axillary adenopathy is noted. The esophagus as visualized is within normal limits. Lungs/Pleura: The lungs are well aerated bilaterally. No focal infiltrate or sizable effusion  is seen. No parenchymal nodules are noted. No pneumothorax is seen. Upper Abdomen: No acute abnormality. Musculoskeletal: Degenerative change of the thoracic spine is noted. No acute bony abnormality is seen. Review of the MIP images confirms the above findings. IMPRESSION: No evidence of pulmonary emboli. No acute abnormality seen. Electronically Signed   By: Inez Catalina M.D.   On: 12/22/2015 14:31    1520:  Pt with occasional desats; short neb given. No PE on CT-A chest. HR 40-60's per baseline, per EPIC chart review. Will admit. T/C to [facility] Dr. Olevia Bowens, case discussed, including:  HPI, pertinent PM/SHx, VS/PE, dx testing, ED course and treatment:  Agreeable to admit, requests he will come to the ED for evaluation.   1605:  Pt now states to myself and [facility] MD that he does not want to stay.  Pt and family informed re: dx testing results, including concerning HPI, and that I recommend observation admission for further evaluation.  Pt refuses admission.  I encouraged pt to stay, continues to refuse.  Pt makes his own medical decisions.  Risks of AMA explained to pt and family, including, but not limited to:  stroke, heart attack, cardiac arrythmia ("irregular heart rate/beat"), "passing out," temporary and/or permanent disability, death.  Pt and family verb understanding and continue to refuse admission, understanding the consequences of their  decision.  I encouraged pt to follow up with his PMD tomorrow and return to the ED immediately if symptoms return, or for any other concerns.  Pt and family verb understanding, agreeable.    Final Clinical Impressions(s) / ED Diagnoses   Final diagnoses:  Syncope, unspecified syncope type  Chest pain, unspecified type    New Prescriptions New Prescriptions   No medications on file     Francine Graven, DO 12/25/15 0840

## 2015-12-22 NOTE — ED Notes (Signed)
Pt states that he is going home to take care of his dog.  Pt says he feels fine now and will get someone to stay with him overnight.  Daughter sitting at bedside with pt.

## 2015-12-22 NOTE — ED Triage Notes (Signed)
Patient complaining of chest pain upon awakening this morning. States "I passed out about 1000 this morning."

## 2015-12-22 NOTE — Progress Notes (Signed)
Patient ID: Patrick Phillips, male   DOB: 03/08/1940, 75 y.o.   MRN: [id number] The patient declined to be admitted to the hospital and signed AMA despite multiple pleas to do so by his daughter, Dr. Thurnell Garbe and myself.  He stated that he needs to go home to take care of his dog. Advised to return to the ER if he changes he mind or symptoms recur.   Tennis Must, MD [phone].

## 2016-12-08 ENCOUNTER — Emergency Department (HOSPITAL_COMMUNITY): Payer: Medicare Other

## 2016-12-08 ENCOUNTER — Encounter (HOSPITAL_COMMUNITY): Payer: Self-pay | Admitting: Emergency Medicine

## 2016-12-08 ENCOUNTER — Emergency Department (HOSPITAL_COMMUNITY)
Admission: EM | Admit: 2016-12-08 | Discharge: 2016-12-08 | Disposition: A | Discharge status: Home / Self Care | Payer: Medicare Other | Attending: Emergency Medicine | Admitting: Emergency Medicine

## 2016-12-08 DIAGNOSIS — I10 Essential (primary) hypertension: Secondary | ICD-10-CM | POA: Diagnosis not present

## 2016-12-08 DIAGNOSIS — Z7984 Long term (current) use of oral hypoglycemic drugs: Secondary | ICD-10-CM | POA: Diagnosis not present

## 2016-12-08 DIAGNOSIS — E119 Type 2 diabetes mellitus without complications: Secondary | ICD-10-CM | POA: Insufficient documentation

## 2016-12-08 DIAGNOSIS — M79672 Pain in left foot: Secondary | ICD-10-CM | POA: Insufficient documentation

## 2016-12-08 DIAGNOSIS — Z79899 Other long term (current) drug therapy: Secondary | ICD-10-CM | POA: Insufficient documentation

## 2016-12-08 MED ORDER — IBUPROFEN 600 MG PO TABS: 600.0000 mg | ORAL_TABLET | Freq: Four times a day (QID) | ORAL | 0 refills | Status: DC | PRN

## 2016-12-08 NOTE — Discharge Instructions (Signed)
There is no sign on your xray tonight of a foreign body, but it is possible you may have a foreign body that does not show up with this test.  I recommend calling Dr Caprice Beaver for a recheck of your foot early next week. You may need a small office surgery to try to find this.  In the interim, try warm epsom salt soaks and take anti inflammatories as prescribed.

## 2016-12-08 NOTE — ED Triage Notes (Addendum)
Patient states he jumped down and landed on two nails that pierced his left foot 2 weeks ago. States wounds "look good" but complaining of worsening pain to left foot. States he saw PCP yesterday for wound check "and everything is ok." States last tetanus shot was last year.

## 2016-12-09 NOTE — ED Provider Notes (Signed)
[facility] DEPT Provider Note   CSN: [id number] Arrival date & time: 12/08/16  2014     History   Chief Complaint Chief Complaint  Patient presents with  . Foot Pain    HPI Patrick Phillips is a 76 y.o. male with history as outlined below including well controlled DM presenting with a persistent foreign body sensation in his left foot after jumping down from a roof and landing on a board, piercing this foot through his work boot with 2 nails.  He was seen by his pcp yesterday for this injury and advised warm soaks and prn follow up.  He reports worsened pain.  He denies drainage, swelling, redness at the site. His pain is worsened with weight bearing and he really believes there is a foreign body in the latera distal foot. He has found no alleviators.  The history is provided by the patient.    Past Medical History:  Diagnosis Date  . Cancer ([place])    skin cancer  . Diabetes mellitus without complication ([place])   . Hypertension     Patient Active Problem List   Diagnosis Date Noted  . Chest pain 12/22/2015  . Chronic back pain 12/22/2015  . Syncope 10/17/2013  . Sinus bradycardia 10/17/2013  . SOB (shortness of breath) 10/17/2013  . Hypertension   . Diabetes mellitus without complication [facility])     Past Surgical History:  Procedure Laterality Date  . BACK SURGERY    . HERNIA REPAIR    . TRIGGER FINGER RELEASE Right 12/02/2015   Procedure: RIGHT THUMB RELEASE TRIGGER FINGER/A-1 PULLEY;  Surgeon: Leanora Cover, MD;  Location: [place];  Service: Orthopedics;  Laterality: Right;       Home Medications    Prior to Admission medications   Medication Sig Start Date End Date Taking? Authorizing Provider  cephALEXin (KEFLEX) 500 MG capsule Take 1 capsule by mouth 3 (three) times daily. 11/30/16  Yes [provider]  losartan (COZAAR) 100 MG tablet Take 1 tablet by mouth daily. 10/11/15  Yes [provider]  metFORMIN (GLUCOPHAGE) 500 MG  tablet Take 1,000 mg by mouth 2 (two) times daily. 07/31/13  Yes [provider]  ibuprofen (ADVIL,MOTRIN) 600 MG tablet Take 1 tablet (600 mg total) by mouth every 6 (six) hours as needed for moderate pain. 12/08/16   Evalee Jefferson, PA-C    Family History Family History  Problem Relation Age of Onset  . Cancer Mother   . Cancer Father   . Cancer Brother   . Cancer Other     Social History Social History  Substance Use Topics  . Smoking status: Never Smoker  . Smokeless tobacco: Never Used  . Alcohol use Yes     Comment: occ     Allergies   Patient has no known allergies.   Review of Systems Review of Systems  Constitutional: Negative for fever.  Musculoskeletal: Positive for arthralgias. Negative for joint swelling and myalgias.  Skin: Negative.  Negative for color change and wound.  Neurological: Negative for weakness and numbness.     Physical Exam Updated Vital Signs BP (!) 160/89 (BP Location: Right Arm)   Pulse 60   Temp 98.2 F (36.8 C) (Oral)   Resp 20   Ht 6' (1.829 m)   Wt 90.7 kg (200 lb)   SpO2 97%   BMI 27.12 kg/m   Physical Exam  Constitutional: He appears well-developed and well-nourished.  HENT:  Head: Atraumatic.  Neck: Normal range of  motion.  Cardiovascular:  Pulses equal bilaterally  Musculoskeletal: He exhibits tenderness.       Feet:  Neurological: He is alert. He has normal strength. He displays normal reflexes. No sensory deficit.  Skin: Skin is warm and dry.  Psychiatric: He has a normal mood and affect.     ED Treatments / Results  Labs (all labs ordered are listed, but only abnormal results are displayed) Labs Reviewed - No data to display  EKG  EKG Interpretation None       Radiology Dg Foot Complete Left  Result Date: 12/08/2016 CLINICAL DATA:  Puncture wound from nails 2 weeks ago. Worsening pain. EXAM: LEFT FOOT - COMPLETE 3+ VIEW COMPARISON:  None. FINDINGS: Moderately severe first MTP degenerative  changes. No bony destruction. No soft tissue gas. No radiopaque foreign body. No fracture or other acute bony abnormality. IMPRESSION: No bony destruction or soft tissue gas. Moderate first MTP degenerative changes. Electronically Signed   By: Andreas Newport M.D.   On: 12/08/2016 21:53    Procedures Procedures (including critical care time)  Medications Ordered in ED Medications - No data to display   Initial Impression / Assessment and Plan / ED Course  I have reviewed the triage vital signs and the nursing notes.  Pertinent labs & imaging results that were available during my care of the patient were reviewed by me and considered in my medical decision making (see chart for details).     No fb per plain imaging, also no obvious fb noted (and no fluid pocket) with bedside US.  Pt prescribed ibuprofen, advised warm soaks, shoe cushion as temp pain reliever.  Referral to podiatry for definitive tx.  Pt may have a fb, but would be better served with podiatry if exploration is anticipated. No signs of infection.  Final Clinical Impressions(s) / ED Diagnoses   Final diagnoses:  Foot pain, left    New Prescriptions Discharge Medication List as of 12/08/2016 10:29 PM    START taking these medications   Details  ibuprofen (ADVIL,MOTRIN) 600 MG tablet Take 1 tablet (600 mg total) by mouth every 6 (six) hours as needed for moderate pain., Starting Fri 12/08/2016, Print         Evalee Jefferson, PA-C 12/09/16 1703    Julianne Rice, MD 12/12/16 [phone]

## 2017-02-08 ENCOUNTER — Other Ambulatory Visit (HOSPITAL_COMMUNITY): Payer: Self-pay | Admitting: Physician Assistant

## 2017-02-08 DIAGNOSIS — R519 Headache, unspecified: Secondary | ICD-10-CM

## 2017-02-08 DIAGNOSIS — R51 Headache: Principal | ICD-10-CM

## 2017-02-15 ENCOUNTER — Ambulatory Visit (HOSPITAL_COMMUNITY): Payer: Medicare Other

## 2017-02-23 ENCOUNTER — Ambulatory Visit (HOSPITAL_COMMUNITY)
Admission: RE | Admit: 2017-02-23 | Discharge: 2017-02-23 | Disposition: A | Discharge status: Home / Self Care | Payer: Medicare Other | Source: Ambulatory Visit | Attending: Physician Assistant | Admitting: Physician Assistant

## 2017-02-23 DIAGNOSIS — J349 Unspecified disorder of nose and nasal sinuses: Secondary | ICD-10-CM | POA: Insufficient documentation

## 2017-02-23 DIAGNOSIS — R51 Headache: Secondary | ICD-10-CM | POA: Diagnosis not present

## 2017-02-23 DIAGNOSIS — G319 Degenerative disease of nervous system, unspecified: Secondary | ICD-10-CM | POA: Insufficient documentation

## 2017-02-23 DIAGNOSIS — R519 Headache, unspecified: Secondary | ICD-10-CM

## 2017-07-20 ENCOUNTER — Other Ambulatory Visit: Payer: Self-pay

## 2017-07-20 ENCOUNTER — Encounter (HOSPITAL_COMMUNITY): Payer: Self-pay | Admitting: Emergency Medicine

## 2017-07-20 ENCOUNTER — Emergency Department (HOSPITAL_COMMUNITY)
Admission: EM | Admit: 2017-07-20 | Discharge: 2017-07-21 | Discharge status: Against Medical Advice | Payer: Medicare Other | Attending: Emergency Medicine | Admitting: Emergency Medicine

## 2017-07-20 ENCOUNTER — Emergency Department (HOSPITAL_COMMUNITY): Payer: Medicare Other

## 2017-07-20 DIAGNOSIS — E119 Type 2 diabetes mellitus without complications: Secondary | ICD-10-CM | POA: Diagnosis not present

## 2017-07-20 DIAGNOSIS — Z79899 Other long term (current) drug therapy: Secondary | ICD-10-CM | POA: Diagnosis not present

## 2017-07-20 DIAGNOSIS — R0602 Shortness of breath: Secondary | ICD-10-CM | POA: Diagnosis not present

## 2017-07-20 DIAGNOSIS — R072 Precordial pain: Secondary | ICD-10-CM | POA: Diagnosis not present

## 2017-07-20 DIAGNOSIS — R531 Weakness: Secondary | ICD-10-CM | POA: Diagnosis not present

## 2017-07-20 DIAGNOSIS — I10 Essential (primary) hypertension: Secondary | ICD-10-CM | POA: Insufficient documentation

## 2017-07-20 DIAGNOSIS — Z7984 Long term (current) use of oral hypoglycemic drugs: Secondary | ICD-10-CM | POA: Insufficient documentation

## 2017-07-20 DIAGNOSIS — Z85828 Personal history of other malignant neoplasm of skin: Secondary | ICD-10-CM | POA: Diagnosis not present

## 2017-07-20 DIAGNOSIS — R200 Anesthesia of skin: Secondary | ICD-10-CM | POA: Diagnosis not present

## 2017-07-20 LAB — DIFFERENTIAL
BASOS PCT: 0 %
Basophils Absolute: 0 10*3/uL (ref 0.0–0.1)
EOS ABS: 0.3 10*3/uL (ref 0.0–0.7)
Eosinophils Relative: 3 %
Lymphocytes Relative: 28 %
Lymphs Abs: 2.9 10*3/uL (ref 0.7–4.0)
MONO ABS: 0.6 10*3/uL (ref 0.1–1.0)
Monocytes Relative: 6 %
Neutro Abs: 6.4 10*3/uL (ref 1.7–7.7)
Neutrophils Relative %: 63 %

## 2017-07-20 LAB — I-STAT CHEM 8, ED
BUN: 22 mg/dL — ABNORMAL HIGH (ref 6–20)
CALCIUM ION: 1.25 mmol/L (ref 1.15–1.40)
Chloride: 105 mmol/L (ref 101–111)
Creatinine, Ser: 1.2 mg/dL (ref 0.61–1.24)
GLUCOSE: 272 mg/dL — AB (ref 65–99)
HCT: 38 % — ABNORMAL LOW (ref 39.0–52.0)
HEMOGLOBIN: 12.9 g/dL — AB (ref 13.0–17.0)
POTASSIUM: 4 mmol/L (ref 3.5–5.1)
Sodium: 143 mmol/L (ref 135–145)
TCO2: 25 mmol/L (ref 22–32)

## 2017-07-20 LAB — CBC
HCT: 40.6 % (ref 39.0–52.0)
Hemoglobin: 13.3 g/dL (ref 13.0–17.0)
MCH: 28.8 pg (ref 26.0–34.0)
MCHC: 32.8 g/dL (ref 30.0–36.0)
MCV: 87.9 fL (ref 78.0–100.0)
Platelets: 214 10*3/uL (ref 150–400)
RBC: 4.62 MIL/uL (ref 4.22–5.81)
RDW: 13.1 % (ref 11.5–15.5)
WBC: 10.1 10*3/uL (ref 4.0–10.5)

## 2017-07-20 LAB — PROTIME-INR
INR: 0.97
Prothrombin Time: 12.8 seconds (ref 11.4–15.2)

## 2017-07-20 LAB — I-STAT TROPONIN, ED: Troponin i, poc: 0 ng/mL (ref 0.00–0.08)

## 2017-07-20 LAB — APTT: aPTT: 29 seconds (ref 24–36)

## 2017-07-20 LAB — ETHANOL

## 2017-07-20 NOTE — ED Triage Notes (Signed)
Patient reports R foot numbness and pain in his R hand that started 2 days ago. Patient ambulatory to Triage, alert and oriented. No slurring of speech, no facial asymmetry.EDP notified.

## 2017-07-21 DIAGNOSIS — Z79899 Other long term (current) drug therapy: Secondary | ICD-10-CM

## 2017-07-21 DIAGNOSIS — R748 Abnormal levels of other serum enzymes: Secondary | ICD-10-CM | POA: Diagnosis not present

## 2017-07-21 DIAGNOSIS — E119 Type 2 diabetes mellitus without complications: Secondary | ICD-10-CM | POA: Diagnosis present

## 2017-07-21 DIAGNOSIS — I672 Cerebral atherosclerosis: Secondary | ICD-10-CM | POA: Diagnosis present

## 2017-07-21 DIAGNOSIS — E785 Hyperlipidemia, unspecified: Secondary | ICD-10-CM | POA: Diagnosis present

## 2017-07-21 DIAGNOSIS — R29700 NIHSS score 0: Secondary | ICD-10-CM | POA: Diagnosis present

## 2017-07-21 DIAGNOSIS — Z7984 Long term (current) use of oral hypoglycemic drugs: Secondary | ICD-10-CM

## 2017-07-21 DIAGNOSIS — I6340 Cerebral infarction due to embolism of unspecified cerebral artery: Principal | ICD-10-CM | POA: Diagnosis present

## 2017-07-21 DIAGNOSIS — Z85828 Personal history of other malignant neoplasm of skin: Secondary | ICD-10-CM

## 2017-07-21 DIAGNOSIS — R001 Bradycardia, unspecified: Secondary | ICD-10-CM | POA: Diagnosis present

## 2017-07-21 DIAGNOSIS — I10 Essential (primary) hypertension: Secondary | ICD-10-CM | POA: Diagnosis present

## 2017-07-21 LAB — CK TOTAL AND CKMB (NOT AT ARMC)
CK, MB: 3.4 ng/mL (ref 0.5–5.0)
Relative Index: 2.8 — ABNORMAL HIGH (ref 0.0–2.5)
Total CK: 123 U/L (ref 49–397)

## 2017-07-21 LAB — COMPREHENSIVE METABOLIC PANEL
ALK PHOS: 65 U/L (ref 38–126)
ALT: 17 U/L (ref 17–63)
AST: 17 U/L (ref 15–41)
Albumin: 3.6 g/dL (ref 3.5–5.0)
Anion gap: 9 (ref 5–15)
BILIRUBIN TOTAL: 0.5 mg/dL (ref 0.3–1.2)
BUN: 23 mg/dL — ABNORMAL HIGH (ref 6–20)
CALCIUM: 9.4 mg/dL (ref 8.9–10.3)
CO2: 25 mmol/L (ref 22–32)
CREATININE: 1.26 mg/dL — AB (ref 0.61–1.24)
Chloride: 105 mmol/L (ref 101–111)
GFR calc non Af Amer: 54 mL/min — ABNORMAL LOW (ref >60)
Glucose, Bld: 278 mg/dL — ABNORMAL HIGH (ref 65–99)
Potassium: 4.1 mmol/L (ref 3.5–5.1)
SODIUM: 139 mmol/L (ref 135–145)
TOTAL PROTEIN: 6.7 g/dL (ref 6.5–8.1)

## 2017-07-21 LAB — I-STAT TROPONIN, ED: TROPONIN I, POC: 0.11 ng/mL — AB (ref 0.00–0.08)

## 2017-07-21 LAB — CBG MONITORING, ED
Glucose-Capillary: 184 mg/dL — ABNORMAL HIGH (ref 65–99)
Glucose-Capillary: 244 mg/dL — ABNORMAL HIGH (ref 65–99)

## 2017-07-21 MED ORDER — INSULIN ASPART 100 UNIT/ML ~~LOC~~ SOLN: 10.0000 [IU] | Freq: Once | SUBCUTANEOUS | Status: DC

## 2017-07-21 NOTE — ED Notes (Signed)
CRITICAL VALUE ALERT  Critical Value:  Troponin 0.11  Date & Time Notied:  07/21/17 0230  Provider Notified: EDP [place]  Orders Received/Actions taken: No orders at this time

## 2017-07-21 NOTE — ED Provider Notes (Signed)
Care assumed from Dr. Rogene Houston at shift change.  Patient presented here with right-sided weakness for the past several days, then chest discomfort that began earlier this afternoon.  His initial troponin was negative and the patient was to remain in the emergency department for a second troponin.  The second troponin returned at 0.11, a significant increase from the initial study.  Due to this small elevation in troponin, I do feel as though the patient should be admitted.  I have spoken with Dr. Manuella Ghazi who has evaluated the patient.  The patient informed him that under no circumstances would he be admitted to the hospital.    He has refused admission and has signed out [place].  He understands the implications of this including death and serious disability, and appears to have decision-making capacity.   Veryl Speak, MD 07/21/17 [phone]

## 2017-07-21 NOTE — ED Notes (Addendum)
Doctor verbal order to hold Insulin 10 units if pt blood sugar is less than 250. ([place] EDP) Pt blood sugar 244. Will continue to monitor patient's blood sugar

## 2017-07-21 NOTE — ED Provider Notes (Signed)
[facility] EMERGENCY DEPARTMENT Provider Note   CSN: [id number] Arrival date & time: 07/20/17  2205   An emergency department physician performed an initial assessment on this suspected stroke patient at 2215(No CT done).  History   Chief Complaint Chief Complaint  Patient presents with  . Numbness    HPI Patrick Phillips is a 77 y.o. male.  Patient arrived as a concern for possible stroke.  Patient's symptoms were definitely greater than 24 hours.  Also was concerned it may be got overheated and the son and patient also had some chest discomfort still does.  Patient had numbness in his right arm and felt as if there was some heaviness to the right arm and right leg.  No speech problems.  Sounds as if some of this was there yesterday patient's daughter states that he is been shuffling with his walking for a period of time.  Patient also not sleeping well.  Past medical history significant for hypertension and diabetes.     Past Medical History:  Diagnosis Date  . Cancer ([place])    skin cancer  . Diabetes mellitus without complication ([place])   . Hypertension     Patient Active Problem List   Diagnosis Date Noted  . Chest pain 12/22/2015  . Chronic back pain 12/22/2015  . Syncope 10/17/2013  . Sinus bradycardia 10/17/2013  . SOB (shortness of breath) 10/17/2013  . Hypertension   . Diabetes mellitus without complication [facility])     Past Surgical History:  Procedure Laterality Date  . BACK SURGERY    . HERNIA REPAIR    . TRIGGER FINGER RELEASE Right 12/02/2015   Procedure: RIGHT THUMB RELEASE TRIGGER FINGER/A-1 PULLEY;  Surgeon: Leanora Cover, MD;  Location: [place];  Service: Orthopedics;  Laterality: Right;        Home Medications    Prior to Admission medications   Medication Sig Start Date End Date Taking? Authorizing Provider  cephALEXin (KEFLEX) 500 MG capsule Take 1 capsule by mouth 3 (three) times daily. 11/30/16   [provider]    ibuprofen (ADVIL,MOTRIN) 600 MG tablet Take 1 tablet (600 mg total) by mouth every 6 (six) hours as needed for moderate pain. 12/08/16   Evalee Jefferson, PA-C  losartan (COZAAR) 100 MG tablet Take 1 tablet by mouth daily. 10/11/15   [provider]  metFORMIN (GLUCOPHAGE) 500 MG tablet Take 1,000 mg by mouth 2 (two) times daily. 07/31/13   [provider]    Family History Family History  Problem Relation Age of Onset  . Cancer Mother   . Cancer Father   . Cancer Brother   . Cancer Other     Social History Social History   Tobacco Use  . Smoking status: Never Smoker  . Smokeless tobacco: Never Used  Substance Use Topics  . Alcohol use: Yes    Comment: occ  . Drug use: No     Allergies   Patient has no known allergies.   Review of Systems Review of Systems  Constitutional: Negative for fever.  HENT: Negative for congestion.   Eyes: Negative for visual disturbance.  Respiratory: Positive for shortness of breath.   Cardiovascular: Positive for chest pain.  Gastrointestinal: Negative for abdominal pain, nausea and vomiting.  Genitourinary: Negative for dysuria.  Musculoskeletal: Negative for back pain.  Skin: Negative for rash.  Neurological: Positive for weakness and numbness. Negative for dizziness, syncope, facial asymmetry, speech difficulty and headaches.  Hematological: Does not bruise/bleed easily.  Psychiatric/Behavioral:  Positive for confusion and sleep disturbance.     Physical Exam Updated Vital Signs BP (!) 179/77 (BP Location: Left Arm)   Pulse (!) 56   Temp 97.8 F (36.6 C)   Resp (!) 22   Ht 1.854 m (6\' 1" )   Wt 101.2 kg (223 lb)   SpO2 98%   BMI 29.42 kg/m   Physical Exam  Constitutional: He appears well-developed and well-nourished. No distress.  HENT:  Head: Normocephalic and atraumatic.  Mouth/Throat: Oropharynx is clear and moist.  Eyes: Pupils are equal, round, and reactive to light. Conjunctivae and EOM are normal.   Neck: Normal range of motion. Neck supple.  Cardiovascular: Normal rate and regular rhythm.  Pulmonary/Chest: Effort normal and breath sounds normal. No respiratory distress.  Abdominal: Soft. Bowel sounds are normal.  Musculoskeletal: He exhibits no edema.  Pulses to his lower extremities 1-2+ dorsalis pedis and posterior tibial pulse good cap refill no swelling to the legs or ankles.  Neurological: He is alert. No cranial nerve deficit.  Patient on initial exam when he came in had some slight weakness to his right lower extremity.  No weakness to the right upper extremity.  No drift.  Sensation grossly seem to be intact.  No cranial nerve deficits.  Skin: Capillary refill takes less than 2 seconds.  Nursing note and vitals reviewed.    ED Treatments / Results  Labs (all labs ordered are listed, but only abnormal results are displayed) Labs Reviewed  COMPREHENSIVE METABOLIC PANEL - Abnormal; Notable for the following components:      Result Value   Glucose, Bld 278 (*)    BUN 23 (*)    Creatinine, Ser 1.26 (*)    GFR calc non Af Amer 54 (*)    All other components within normal limits  I-STAT CHEM 8, ED - Abnormal; Notable for the following components:   BUN 22 (*)    Glucose, Bld 272 (*)    Hemoglobin 12.9 (*)    HCT 38.0 (*)    All other components within normal limits  ETHANOL  PROTIME-INR  APTT  CBC  DIFFERENTIAL  CK TOTAL AND CKMB (NOT AT [facility])  RAPID URINE DRUG SCREEN, HOSP PERFORMED  URINALYSIS, ROUTINE W REFLEX MICROSCOPIC  I-STAT TROPONIN, ED    EKG EKG Interpretation  Date/Time:  Friday Jul 20 2017 22:25:08 EDT Ventricular Rate:  58 PR Interval:    QRS Duration: 98 QT Interval:  439 QTC Calculation: 432 R Axis:   -22 Text Interpretation:  Sinus rhythm Left ventricular hypertrophy Baseline wander in lead(s) V2 No significant change since last tracing Confirmed by Fredia Sorrow [phone]) on 07/20/2017 10:45:15 PM   Radiology Dg Chest 2 View  Result  Date: 07/20/2017 CLINICAL DATA:  Shortness of breath, onset tonight. Right foot numbness and right hand pain starting 2 days ago. Pain now radiates to the shoulder. History of diabetes and hypertension. EXAM: CHEST - 2 VIEW COMPARISON:  12/22/2015 FINDINGS: Heart size and pulmonary vascularity are normal. No airspace disease or consolidation in the lungs. No blunting of costophrenic angles. No pneumothorax. Mediastinal contours appear intact. Calcification of the aorta. Degenerative changes in the spine. IMPRESSION: No evidence of active pulmonary disease.  Aortic atherosclerosis. Electronically Signed   By: Lucienne Capers M.D.   On: 07/20/2017 23:24   Ct Head Wo Contrast  Result Date: 07/20/2017 CLINICAL DATA:  Right foot numbness and pain in the right hand EXAM: CT HEAD WITHOUT CONTRAST TECHNIQUE: Contiguous axial images were obtained  from the base of the skull through the vertex without intravenous contrast. COMPARISON:  02/23/2017 FINDINGS: Brain: No acute territorial infarction, hemorrhage or intracranial mass. 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 Moderate atrophy. Stable ventricle size. Vascular: No hyperdense vessels. Carotid vascular calcification. Vertebral artery calcification. Skull: Normal. Negative for fracture or focal lesion. Sinuses/Orbits: Moderate mucosal thickening in the ethmoids sinuses with mild thickening in the frontal and maxillary sinuses. Other: None. IMPRESSION: 1. No CT evidence for acute intracranial abnormality. 2. Atrophy Electronically Signed   By: Donavan Foil M.D.   On: 07/20/2017 23:37    Procedures Procedures (including critical care time)  CRITICAL CARE Performed by: Fredia Sorrow Total critical care time: 30 most likely was minutes Critical care time was exclusive of separately billable procedures and treating other patients. Critical care was necessary to treat or prevent imminent or life-threatening deterioration. Critical care was time spent  personally by me on the following activities: development of treatment plan with patient and/or surrogate as well as nursing, discussions with consultants, evaluation of patient's response to treatment, examination of patient, obtaining history from patient or surrogate, ordering and performing treatments and interventions, ordering and review of laboratory studies, ordering and review of radiographic studies, pulse oximetry and re-evaluation of patient's condition.   Medications Ordered in ED Medications - No data to display   Initial Impression / Assessment and Plan / ED Course  I have reviewed the triage vital signs and the nursing notes.  Pertinent labs & imaging results that were available during my care of the patient were reviewed by me and considered in my medical decision making (see chart for details).    Patient difficult historian but it sounds at the very least symptoms have been present for 24 hours or longer.  There was some concern about the right leg weakness numbness in the right arm no speech problems.  Also some concerns about shuffling gait for the past several weeks.  Patient also may be exposed to hot environment not sleeping well patient also had some chest pain.  Based on this code stroke stuff was initiated but code stroke was not called.  Patient not a Lucianne Lei and based on the duration of symptoms is greater than 24 hours and has no significant neuro deficit anyways.  Chest x-ray negative first troponin negative repeat troponin at 2 in the morning.  Basic electrolytes showed an elevated blood sugar in the upper 200s we will give some IV insulin patient's complete metabolic panel is pending and CPK is pending.  This will be followed up by the overnight doctor.  If these things have no significant abnormalities and patient remains improved as he is now he can be discharged home to follow-up with his doctor in [place] would recommend outpatient MRI.  And patient knows to return  for any new or worse symptoms.  Also recommended may be some over-the-counter supplement melatonin to help with the sleep.  Final Clinical Impressions(s) / ED Diagnoses   Final diagnoses:  Precordial pain  Numbness  Weakness    ED Discharge Orders    None       Fredia Sorrow, MD 07/21/17 [phone]

## 2017-07-21 NOTE — ED Notes (Signed)
Pt requesting to leave AMA  EDP notified, told this RN to let patient sign out.

## 2017-07-24 ENCOUNTER — Inpatient Hospital Stay (HOSPITAL_COMMUNITY)
Admission: EM | Admit: 2017-07-24 | Discharge: 2017-07-26 | DRG: 042 | Disposition: A | Discharge status: Home / Self Care | Payer: Medicare Other | Attending: Family Medicine | Admitting: Family Medicine

## 2017-07-24 ENCOUNTER — Emergency Department (HOSPITAL_COMMUNITY): Payer: Medicare Other

## 2017-07-24 ENCOUNTER — Other Ambulatory Visit: Payer: Self-pay

## 2017-07-24 ENCOUNTER — Encounter (HOSPITAL_COMMUNITY): Payer: Self-pay | Admitting: Emergency Medicine

## 2017-07-24 DIAGNOSIS — I672 Cerebral atherosclerosis: Secondary | ICD-10-CM | POA: Diagnosis present

## 2017-07-24 DIAGNOSIS — E119 Type 2 diabetes mellitus without complications: Secondary | ICD-10-CM | POA: Diagnosis not present

## 2017-07-24 DIAGNOSIS — Z87898 Personal history of other specified conditions: Secondary | ICD-10-CM | POA: Diagnosis not present

## 2017-07-24 DIAGNOSIS — I639 Cerebral infarction, unspecified: Secondary | ICD-10-CM | POA: Diagnosis not present

## 2017-07-24 DIAGNOSIS — I10 Essential (primary) hypertension: Secondary | ICD-10-CM | POA: Diagnosis not present

## 2017-07-24 DIAGNOSIS — I6389 Other cerebral infarction: Secondary | ICD-10-CM | POA: Diagnosis not present

## 2017-07-24 DIAGNOSIS — E785 Hyperlipidemia, unspecified: Secondary | ICD-10-CM | POA: Diagnosis present

## 2017-07-24 DIAGNOSIS — R001 Bradycardia, unspecified: Secondary | ICD-10-CM | POA: Diagnosis not present

## 2017-07-24 DIAGNOSIS — R7989 Other specified abnormal findings of blood chemistry: Secondary | ICD-10-CM

## 2017-07-24 DIAGNOSIS — I6340 Cerebral infarction due to embolism of unspecified cerebral artery: Secondary | ICD-10-CM | POA: Diagnosis present

## 2017-07-24 DIAGNOSIS — Z85828 Personal history of other malignant neoplasm of skin: Secondary | ICD-10-CM | POA: Diagnosis not present

## 2017-07-24 DIAGNOSIS — R748 Abnormal levels of other serum enzymes: Secondary | ICD-10-CM | POA: Diagnosis present

## 2017-07-24 DIAGNOSIS — R29700 NIHSS score 0: Secondary | ICD-10-CM | POA: Diagnosis present

## 2017-07-24 DIAGNOSIS — Z7984 Long term (current) use of oral hypoglycemic drugs: Secondary | ICD-10-CM | POA: Diagnosis not present

## 2017-07-24 DIAGNOSIS — R778 Other specified abnormalities of plasma proteins: Secondary | ICD-10-CM

## 2017-07-24 DIAGNOSIS — Z79899 Other long term (current) drug therapy: Secondary | ICD-10-CM | POA: Diagnosis not present

## 2017-07-24 DIAGNOSIS — I5030 Unspecified diastolic (congestive) heart failure: Secondary | ICD-10-CM | POA: Diagnosis not present

## 2017-07-24 LAB — GLUCOSE, CAPILLARY: GLUCOSE-CAPILLARY: 177 mg/dL — AB (ref 65–99)

## 2017-07-24 LAB — BASIC METABOLIC PANEL
ANION GAP: 8 (ref 5–15)
BUN: 14 mg/dL (ref 6–20)
CALCIUM: 9.2 mg/dL (ref 8.9–10.3)
CO2: 25 mmol/L (ref 22–32)
Chloride: 107 mmol/L (ref 101–111)
Creatinine, Ser: 1.13 mg/dL (ref 0.61–1.24)
GFR calc Af Amer: >60 mL/min (ref >60)
GLUCOSE: 244 mg/dL — AB (ref 65–99)
Potassium: 3.9 mmol/L (ref 3.5–5.1)
Sodium: 140 mmol/L (ref 135–145)

## 2017-07-24 LAB — CBC
HEMATOCRIT: 42.9 % (ref 39.0–52.0)
HEMOGLOBIN: 13.9 g/dL (ref 13.0–17.0)
MCH: 28 pg (ref 26.0–34.0)
MCHC: 32.4 g/dL (ref 30.0–36.0)
MCV: 86.5 fL (ref 78.0–100.0)
Platelets: 214 10*3/uL (ref 150–400)
RBC: 4.96 MIL/uL (ref 4.22–5.81)
RDW: 12.6 % (ref 11.5–15.5)
WBC: 8.3 10*3/uL (ref 4.0–10.5)

## 2017-07-24 LAB — I-STAT TROPONIN, ED: TROPONIN I, POC: 0 ng/mL (ref 0.00–0.08)

## 2017-07-24 LAB — TROPONIN I

## 2017-07-24 MED ORDER — GLIPIZIDE 5 MG PO TABS
5.0000 mg | ORAL_TABLET | Freq: Every day | ORAL | Status: DC
  Administered 2017-07-25 – 2017-07-26 (×2): 5 mg via ORAL
  Filled 2017-07-24 (×3): qty 1

## 2017-07-24 MED ORDER — ACETAMINOPHEN 160 MG/5ML PO SOLN: 650.0000 mg | ORAL | Status: DC | PRN

## 2017-07-24 MED ORDER — METFORMIN HCL 500 MG PO TABS
1000.0000 mg | ORAL_TABLET | Freq: Two times a day (BID) | ORAL | Status: DC
  Administered 2017-07-25 – 2017-07-26 (×3): 1000 mg via ORAL
  Filled 2017-07-24 (×3): qty 2

## 2017-07-24 MED ORDER — STROKE: EARLY STAGES OF RECOVERY BOOK
Freq: Once | Status: AC
  Administered 2017-07-24: 22:00:00
  Filled 2017-07-24: qty 1

## 2017-07-24 MED ORDER — ASPIRIN 325 MG PO TABS
325.0000 mg | ORAL_TABLET | Freq: Every day | ORAL | Status: DC
  Administered 2017-07-24 – 2017-07-25 (×2): 325 mg via ORAL
  Filled 2017-07-24: qty 1

## 2017-07-24 MED ORDER — ATORVASTATIN CALCIUM 10 MG PO TABS
20.0000 mg | ORAL_TABLET | Freq: Every day | ORAL | Status: DC
  Administered 2017-07-25 – 2017-07-26 (×2): 20 mg via ORAL
  Filled 2017-07-24 (×2): qty 2

## 2017-07-24 MED ORDER — ACETAMINOPHEN 650 MG RE SUPP: 650.0000 mg | RECTAL | Status: DC | PRN

## 2017-07-24 MED ORDER — ESCITALOPRAM OXALATE 10 MG PO TABS
10.0000 mg | ORAL_TABLET | Freq: Every day | ORAL | Status: DC
  Administered 2017-07-25 – 2017-07-26 (×2): 10 mg via ORAL
  Filled 2017-07-24 (×2): qty 1

## 2017-07-24 MED ORDER — ASPIRIN 300 MG RE SUPP: 300.0000 mg | Freq: Every day | RECTAL | Status: DC

## 2017-07-24 MED ORDER — SENNOSIDES-DOCUSATE SODIUM 8.6-50 MG PO TABS: 1.0000 | ORAL_TABLET | Freq: Every evening | ORAL | Status: DC | PRN

## 2017-07-24 MED ORDER — GADOBENATE DIMEGLUMINE 529 MG/ML IV SOLN
20.0000 mL | Freq: Once | INTRAVENOUS | Status: AC
  Administered 2017-07-24: 20 mL via INTRAVENOUS

## 2017-07-24 MED ORDER — ACETAMINOPHEN 325 MG PO TABS
650.0000 mg | ORAL_TABLET | ORAL | Status: DC | PRN
  Administered 2017-07-25: 650 mg via ORAL
  Filled 2017-07-24: qty 2

## 2017-07-24 MED ORDER — ENOXAPARIN SODIUM 40 MG/0.4ML ~~LOC~~ SOLN
40.0000 mg | SUBCUTANEOUS | Status: DC
  Administered 2017-07-24 – 2017-07-25 (×2): 40 mg via SUBCUTANEOUS
  Filled 2017-07-24 (×2): qty 0.4

## 2017-07-24 NOTE — ED Notes (Signed)
Pt remains in MRI 

## 2017-07-24 NOTE — ED Provider Notes (Signed)
[place] EMERGENCY DEPARTMENT Provider Note   CSN: [id number] Arrival date & time: 07/24/17  4656     History   Chief Complaint Chief Complaint  Patient presents with  . Abnormal Lab    HPI Patrick Phillips is a 77 y.o. male.  HPI   Patient is a 77 year old male with a history of type 2 diabetes mellitus, hypertension, and hyperlipidemia presenting for evaluation after abnormal labs on 07-20-2017.  Patient presented to any [facility] emergency department on 07-20-2017 after calling his daughter saying that his right arm felt numb.  Patient reporting at this time that he had some pain in the shoulder with movement as well as some stiffness in the hand, that he was attributing to arthritis.  When he presented to the emergency department, there is also concern that his right leg was weaker, and he had reported to his daughter that his right leg was "dragging".  Patient's daughter does report that he shuffles his gait at baseline.  Patient denied any difficulty with speech, swallowing, vision.  Patient  reports that all the symptoms resolved spontaneously, and that he is asymptomatic of the symptoms today.  Patient also denied that he had chest pain on Friday, and denies any chest pain, shortness of breath, dizziness, lightheadedness, or syncopal sensations now.  On chart review, patient was encouraged to stay in the hospital for trending of troponin, as well as MRI, as he had a positive delta troponin with i-STAT troponin.  Patient reports he was unsure what this meant, and requested that he wanted to leave, because he did not want to be admitted.  Patient and his daughter consulted his primary care provider, who recommended that patient present to the emergency department for further evaluation.  Patient reporting that he is amenable to admission and further work-up is required at this time for abnormal results.   Past Medical History:  Diagnosis Date  . Cancer ([place])    skin cancer    . Diabetes mellitus without complication ([place])   . Hypertension     Patient Active Problem List   Diagnosis Date Noted  . Chest pain 12/22/2015  . Chronic back pain 12/22/2015  . Syncope 10/17/2013  . Sinus bradycardia 10/17/2013  . SOB (shortness of breath) 10/17/2013  . Hypertension   . Diabetes mellitus without complication [facility])     Past Surgical History:  Procedure Laterality Date  . BACK SURGERY    . HERNIA REPAIR    . TRIGGER FINGER RELEASE Right 12/02/2015   Procedure: RIGHT THUMB RELEASE TRIGGER FINGER/A-1 PULLEY;  Surgeon: Leanora Cover, MD;  Location: [place];  Service: Orthopedics;  Laterality: Right;        Home Medications    Prior to Admission medications   Medication Sig Start Date End Date Taking? Authorizing Provider  cephALEXin (KEFLEX) 500 MG capsule Take 1 capsule by mouth 3 (three) times daily. 11/30/16   [provider]  ibuprofen (ADVIL,MOTRIN) 600 MG tablet Take 1 tablet (600 mg total) by mouth every 6 (six) hours as needed for moderate pain. 12/08/16   Evalee Jefferson, PA-C  losartan (COZAAR) 100 MG tablet Take 1 tablet by mouth daily. 10/11/15   [provider]  metFORMIN (GLUCOPHAGE) 500 MG tablet Take 1,000 mg by mouth 2 (two) times daily. 07/31/13   [provider]    Family History Family History  Problem Relation Age of Onset  . Cancer Mother   . Cancer Father   . Cancer Brother   .  Cancer Other     Social History Social History   Tobacco Use  . Smoking status: Never Smoker  . Smokeless tobacco: Never Used  Substance Use Topics  . Alcohol use: Yes    Comment: occ  . Drug use: No     Allergies   Patient has no known allergies.   Review of Systems Review of Systems  Constitutional: Negative for chills and fever.  HENT: Negative for congestion and rhinorrhea.   Eyes: Negative for visual disturbance.  Respiratory: Negative for chest tightness and shortness of breath.   Gastrointestinal:  Negative for nausea and vomiting.  Musculoskeletal: Positive for arthralgias. Negative for joint swelling.  Neurological: Negative for dizziness, syncope, facial asymmetry, speech difficulty, weakness, light-headedness, numbness and headaches.  All other systems reviewed and are negative.    Physical Exam Updated Vital Signs BP (!) 151/73 (BP Location: Right Arm)   Pulse (!) 52   Temp 97.9 F (36.6 C) (Oral)   Resp 12   SpO2 99%   Physical Exam  Constitutional: He appears well-developed and well-nourished. No distress.  HENT:  Head: Normocephalic and atraumatic.  Mouth/Throat: Oropharynx is clear and moist.  Eyes: Pupils are equal, round, and reactive to light. Conjunctivae and EOM are normal.  Neck: Normal range of motion. Neck supple.  Cardiovascular: Normal rate, regular rhythm, S1 normal and S2 normal.  No murmur heard. Pulses 2+ and symmetric in bilateral upper externally's.  Pulmonary/Chest: Effort normal and breath sounds normal. He has no wheezes. He has no rales.  Abdominal: Soft. He exhibits no distension. There is no tenderness. There is no guarding.  Musculoskeletal: Normal range of motion. He exhibits no edema or deformity.  There is a firm, well-circumscribed cystic structure over the anterior forearm.  No erythema.  No fluctuance.  Lymphadenopathy:    He has no cervical adenopathy.  Neurological: He is alert.  Mental Status:  Alert, oriented, thought content appropriate, able to give a coherent history. Speech fluent without evidence of aphasia. Able to follow 2 step commands without difficulty.  Cranial Nerves:  II:  Peripheral visual fields grossly normal, pupils equal, round, reactive to light III,IV, VI: ptosis not present, extra-ocular motions intact bilaterally  V,VII: smile symmetric, facial light touch sensation equal VIII: hearing grossly normal to voice  X: uvula elevates symmetrically  XI: bilateral shoulder shrug symmetric and strong XII: midline  tongue extension without fassiculations Motor:  Normal tone. 5/5 in upper and lower extremities bilaterally including strong and equal grip strength and dorsiflexion/plantar flexion Sensory: Pinprick and light touch normal in all extremities.  Deep Tendon Reflexes: 2+ and symmetric in the biceps and patella. Cerebellar: normal finger-to-nose with bilateral upper extremities Gait: normal gait and balance. No evidence of footdrop. Stance: No pronator drift and good coordination, strength, and position sense with tapping of bilateral arms (performed in sitting position). CV: distal pulses palpable throughout. 2+ and symmetric throughout.   Skin: Skin is warm and dry. No rash noted. No erythema.  Psychiatric: He has a normal mood and affect. His behavior is normal. Judgment and thought content normal.  Nursing note and vitals reviewed.    ED Treatments / Results  Labs (all labs ordered are listed, but only abnormal results are displayed) Labs Reviewed  BASIC METABOLIC PANEL - Abnormal; Notable for the following components:      Result Value   Glucose, Bld 244 (*)    All other components within normal limits  CBC  TROPONIN I  I-STAT TROPONIN, ED  EKG EKG Interpretation  Date/Time:  Tuesday Jul 24 2017 10:26:45 EDT Ventricular Rate:  56 PR Interval:  170 QRS Duration: 92 QT Interval:  424 QTC Calculation: 409 R Axis:   -16 Text Interpretation:  Poor data quality Sinus bradycardia Minimal voltage criteria for LVH, may be normal variant Septal infarct , age undetermined Abnormal ECG Confirmed by Quintella Reichert [phone]) on 07/24/2017 1:46:46 PM   Radiology Dg Chest 2 View  Result Date: 07/24/2017 CLINICAL DATA:  Weakness. Intermittent chest pain. Shortness of breath. EXAM: CHEST - 2 VIEW COMPARISON:  07/20/2017. FINDINGS: Mediastinum and hilar structures normal. Lungs are clear. No pleural effusion or pneumothorax. Cardiomegaly with normal pulmonary vascularity. No acute bony  abnormality. Degenerative changes thoracic spine with thoracic spine scoliosis. IMPRESSION: No acute cardiopulmonary disease. Electronically Signed   By: Marcello Moores  Register   On: 07/24/2017 11:53    Procedures Procedures (including critical care time)  Medications Ordered in ED Medications - No data to display   Initial Impression / Assessment and Plan / ED Course  I have reviewed the triage vital signs and the nursing notes.  Pertinent labs & imaging results that were available during my care of the patient were reviewed by me and considered in my medical decision making (see chart for details).  Clinical Course as of Jul 25 1727  Tue Jul 24, 2017  1612 Spoke with Dr. Keturah Barre. [place] regarding possibility of admission, and patient's ambivalence to be admitted.  Will continue to have discussions with patient regarding admission, and re-update after troponin and MRI have returned. Appreciate [facility] involvement in this patient's case.    [AM]  1628 Engaged in shared decision-making with the patient regarding his symptoms and further work-up.  Patient expressed that if he has a normal MRI and normal formal troponin, he does not wish to stay and have inpatient risk stratification work-up for TIA and ACS.  I discussed the risks and benefits of this with the patient including risk of future CVA or ACS, and patient expresses with full decision-making capacity that he prefers to do this outpatient.    [AM]  [place] signed out to Irena Cords, PA-C to follow MRI and discuss results with patient.    [AM]  6045 Spoke with MRI, and MR angiogram of head and neck are added on without contrast per recommendation of Dr. Leonel Ramsay.   [AM]    Clinical Course User Index [AM] Albesa Seen, PA-C    Patient is nontoxic-appearing and in no acute distress per patient and neurologically intact on my examination.  Patient does have risk factors for TIA/CVA including hyperlipidemia, hypertension, and diabetes  mellitus.  Patient did not meet criteria for code stroke call on 07-20-2017.  Patient also has multiple risk factors for ACS.  Patient had elevation i-STAT troponin at [facility] on 07-21-2017 up to 0.11, patient declined for admission for further pending.  Formal troponin negative today. Patient is hemodynamically stable at present and in no acute distress without chest pain or shortness of breath.  Therefore, doubt ongoing unstable angina, pulmonary embolism, or thoracic aortic dissection.  Patient has normal and symmetric pulses in all extremities.  Initial lab work remarkable only for hyperglycemia at 244.  Patient did have labile blood pressures in emergency department, and has not taken his nightly doses of medications.  Dr. Leonel Ramsay to see patient if patient still here.  If patient leaves AMA, per Dr. Cecil Cobbs recommendation, patient is to receive 75 mg of Plavix for 21 days and  to use 81 mg of aspirin daily, and continue the 81 mg of aspirin indefinitely.  See discussion documented above for shared decision-making with patient.  I encouraged follow-up as soon as possible with primary care provider for further cardiac and neurologic work-up follow-up per Dr. Sharlet Salina recommendations.  Patient awaiting MRI at this time.   This is a shared visit with Dr. Hazle Coca. Patient was independently evaluated by this attending physician. Attending physician consulted in evaluation and management.  Final Clinical Impressions(s) / ED Diagnoses   Final diagnoses:  Elevated troponin  History of numbness  Elevated blood pressure reading with diagnosis of hypertension    ED Discharge Orders    None       Tamala Julian 07/24/17 1736    Quintella Reichert, MD 07/26/17 1056

## 2017-07-24 NOTE — ED Notes (Signed)
Neurology at the bedside

## 2017-07-24 NOTE — ED Notes (Signed)
Admitting MD at bedside.

## 2017-07-24 NOTE — ED Notes (Signed)
Pt returned from MRI. Pt refusing to be hooked back up to monitor. Pt states he is leaving after getting MRI results.

## 2017-07-24 NOTE — ED Notes (Signed)
ED Provider at bedside. 

## 2017-07-24 NOTE — ED Triage Notes (Signed)
Pt arrives to ED with daughter. Pt was at [facility] on 5/25 for weakness and numbness to right leg. Pt was In ED there and was having intermittent cp pains. Pt had initial negative troponin but 3 hours later, was 0.11. Pt decided to leave AMA. Pt came today after daughter called PCP and ask him to come back. Pt denies any symptoms.

## 2017-07-24 NOTE — ED Notes (Signed)
Family at bedside. 

## 2017-07-24 NOTE — Consult Note (Signed)
Neurology Consultation Reason for Consult: Right arm numbness and weakness Referring Physician: Dr. Ralene Bathe  CC: CP and right arm numbness and weakness  History is obtained from: patient and daughter  HPI: Patrick Phillips is a 77 y.o. male with PMH significant for HTN, DM, hyperlipidemia. Presents to Ed for right sided weakness and numbness that he had Friday 5/25.  Patient intially presented Friday 5/25 to [facility] emergency room with chest pain and Right side numbness and weakness. Denies any numbness, tingling or feeling weak today. Patient stated that on Friday his right arm felt like it was going to fall off.  He noticed the weakness first and then later it became numb. Called his daughter about 8 pm that night and they went to [employer]. At [facility]  Second Troponin was 0.11. He was told at [facility] that he would be admitted and transferred to [facility]. Patient did not want to be admitted and left [place]. Today daughter called PCP, and his PCP told him to come back to the hospital so he came to [facility] Patient denies also denies any SOB, vision changes  Patient states that on Friday he has spent all day in 90+ degree weather mowing the lawn and tending to his yard. He also had to mow along an uneven ditch. Per patient this is the reason for his right side being weak.  Neurology was consulted at patient arrival to ED for right sided weakness and numbness  Patient reports transient left-sided numbness and weakness with current return to normal baseline with no residual deficits.  Never had strokes in the past.  LKW: 5/25 1600 tpa given?: no,contraindicated Premorbid modified rankin scale:1 NIH:0  ROS:ROS was performed and is negative except as noted in the HPI.  Past Medical History:  Diagnosis Date  . Cancer ([place])    skin cancer  . Diabetes mellitus without complication ([place])   . Hypertension     Family History  Problem Relation Age of Onset  . Cancer Mother   . Cancer Father   .  Cancer Brother   . Cancer Other    Social History:  reports that he has never smoked. He has never used smokeless tobacco. He reports that he drinks alcohol. He reports that he does not use drugs.   Exam: Current vital signs: BP (!) 151/73 (BP Location: Right Arm)   Pulse (!) 54   Temp 97.9 F (36.6 C) (Oral)   Resp (!) 21   SpO2 94%  Vital signs in last 24 hours: Temp:  [97.9 F (36.6 C)-98 F (36.7 C)] 97.9 F (36.6 C) (05/28 1229) Pulse Rate:  [52-56] 54 (05/28 1430) Resp:  [12-21] 21 (05/28 1430) BP: (151-154)/(73-74) 151/73 (05/28 1229) SpO2:  [94 %-99 %] 94 % (05/28 1430) Physical Exam  Constitutional: Appears well-developed and well-nourished.  Psych: Affect appropriate to situation Eyes: No scleral injection HENT: No OP obstrucion Head: Normocephalic.  Cardiovascular: Normal rate and regular rhythm.  Respiratory: Effort normal, non-labored breathing Skin: WDI Neuro: Mental Status: Patient is awake, alert, oriented to person, place, month, and situation. He states he doesn't know the year normally only when he gets his check. Patient is able to give a clear and coherent history. No signs of aphasia or neglect Cranial Nerves: II: Visual Fields are full. Pupils are equal, round, and reactive to light.   III,IV, VI: EOMI without ptosis or diploplia.  V: Facial sensation is symmetric to light touch VII: Facial movement is symmetric.  VIII:  hearing is intact to voice X: Uvula elevates symmetrically XI: Shoulder shrug is symmetric. XII: tongue is midline without atrophy or fasciculations.  Motor: Tone is normal. Bulk is normal. 5/5 strength was present in all four extremities, but right arm and are weaker than the left. Sensory: Sensation is symmetric to light touch in the arms and legs. Deep Tendon Reflexes: 2+ and symmetric in the biceps and patellae.  Plantars: Toes are downgoing bilaterally.  Cerebellar: FNF and HKS are intact bilaterally  I have reviewed  labs in epic and the results pertinent to this consultation are: BG: 244 BMP: normal except elevated BG Troponin: 0.00  I have reviewed the images obtained: MRI ordered CT Head from 5/25  Atrophy noted, but no acute intracranial abnormality  Laurey Morale, MSN, NP-C [facility] [phone]   Attending Neurohospitalist Addendum Patient seen and examined with APP/Resident. Agree with the history and physical as documented above. Agree with the plan as documented, which I helped formulate. I have intermittently reviewed the images.  CT scan with no acute changes. MRI of the brain shows a subcentimeter area of restricted diffusion in the left temporal lobe.  Impression:  77 year old man with past history of hypertension diabetes hyperlipidemia presented for evaluation of right-sided numbness and weakness that was transient and has since resolved.  Also had chest pain.  Was evaluated at any pain hospital with elevated troponin but decided to leave the hospital AMA. Comes back today to the emergency room on the insistence of his primary care for evaluation for a possible TIA since the symptoms clinically resolved. MRI shows an area of restricted diffusion in the left temporal lobe.  Small punctate area, likely embolic-cardio embolic versus atheroembolic.  Updated recommendations: -Admit to hospitalist -Telemetry monitoring -Allow for permissive hypertension for the first 24-48h - only treat PRN if SBP >220 mmHg. Blood pressures can be gradually normalized to SBP<140 upon discharge. -CT Angiogram of Head and neck -Echocardiogram -HgbA1c, fasting lipid panel -Frequent neuro checks -Prophylactic therapy-Antiplatelet med: Aspirin - dose 325mg  PO or 300mg  PR -Atorvastatin 80 mg PO daily -PT consult, OT consult, Speech consult -If Afib found on telemetry, will need anticoagulation. Decision pending imaging and stroke team rounding.  Please page stroke NP/PA/MD (listed on  AMION)  from 8am-4 pm as this patient will be followed by the stroke team at this point.  --- Amie Portland, MD [facility] Pager: [phone]  If 7pm to 7am, please call on call as listed on AMION.

## 2017-07-24 NOTE — H&P (Signed)
History and Physical    Patrick Phillips PIR:[id number] DOB: May 04, 1940 DOA: 07/24/2017  PCP: System, Pcp Not In  Patient coming from: Home  I have personally briefly reviewed patient's old medical records in [facility]  Chief Complaint: R arm numbness and weakness  HPI: Patrick Phillips is a 77 y.o. male with medical history significant of HTN, DM2.  Patient initially seen 5/25 at [facility] with CP and R sided numbness and weakness.  Denies any symptoms today.  Patient stated that on Friday his right arm felt like it was going to fall off.  He noticed the weakness first and then later it became numb. Called his daughter about 8 pm that night and they went to [employer]. At [facility]  Second Troponin was 0.11. He was told at [facility] that he would be admitted and transferred to [facility]. Patient did not want to be admitted and left [place]. Today daughter called PCP, and his PCP told him to come back to the hospital so he came to [facility]. Patient denies also denies any SOB, vision changes  Patient states that on Friday he has spent all day in 90+ degree weather mowing the lawn and tending to his yard. He also had to mow along an uneven ditch. Per patient this is the reason for his right side being weak.  Patient reports currently back at baseline.   ED Course: Trop neg x2.  MRI brain confirms punctate acute ischemic stroke L temporal lobe.  MRA head and neck shows moderate atherosclerosis.  No hemodynamically significant ICA stenosis bilaterally.  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  L vertebral art occlusion MRA brain.  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   Review of Systems: As per HPI otherwise 10 point review of systems negative.   Past Medical History:  Diagnosis Date  . Cancer ([place])    skin cancer  . Diabetes mellitus without complication ([place])   . Hypertension     Past Surgical History:  Procedure Laterality Date  . BACK SURGERY    . HERNIA REPAIR    . TRIGGER FINGER RELEASE  Right 12/02/2015   Procedure: RIGHT THUMB RELEASE TRIGGER FINGER/A-1 PULLEY;  Surgeon: Leanora Cover, MD;  Location: [place];  Service: Orthopedics;  Laterality: Right;     reports that he has never smoked. He has never used smokeless tobacco. He reports that he drinks alcohol. He reports that he does not use drugs.  No Known Allergies  Family History  Problem Relation Age of Onset  . Cancer Mother   . Cancer Father   . Cancer Brother   . Cancer Other      Prior to Admission medications   Medication Sig Start Date End Date Taking? Authorizing Provider  amLODipine (NORVASC) 5 MG tablet Take 5 mg by mouth 2 (two) times daily. 05/30/17  Yes [provider]  atorvastatin (LIPITOR) 20 MG tablet Take 20 mg by mouth daily. 05/14/17  Yes [provider]  escitalopram (LEXAPRO) 10 MG tablet Take 10 mg by mouth daily. 07/11/17  Yes [provider]  glipiZIDE (GLUCOTROL) 5 MG tablet Take 5 mg by mouth daily. 07/11/17  Yes [provider]  metFORMIN (GLUCOPHAGE) 500 MG tablet Take 1,000 mg by mouth 2 (two) times daily. 07/31/13  Yes [provider]    Physical Exam: Vitals:   07/24/17 1500 07/24/17 1530 07/24/17 1600 07/24/17 2000  BP: (!) 171/78 (!) 165/67 (!) 180/81   Pulse: (!) 53 Marland Kitchen)  51 (!) 52   Resp: 19 17 16    Temp:    98.3 F (36.8 C)  TempSrc:      SpO2: 96% 97% 96%     Constitutional: NAD, calm, comfortable Eyes: PERRL, lids and conjunctivae normal ENMT: Mucous membranes are moist. Posterior pharynx clear of any exudate or lesions.Normal dentition.  Neck: normal, supple, no masses, no thyromegaly Respiratory: clear to auscultation bilaterally, no wheezing, no crackles. Normal respiratory effort. No accessory muscle use.  Cardiovascular: Regular rate and rhythm, no murmurs / rubs / gallops. No extremity edema. 2+ pedal pulses. No carotid bruits.  Abdomen: no tenderness, no masses palpated. No hepatosplenomegaly. Bowel sounds  positive.  Musculoskeletal: no clubbing / cyanosis. No joint deformity upper and lower extremities. Good ROM, no contractures. Normal muscle tone.  Skin: no rashes, lesions, ulcers. No induration Neurologic: CN 2-12 grossly intact. Sensation intact, DTR normal. Strength 5/5 in all 4.  Psychiatric: Normal judgment and insight. Alert and oriented x 3. Normal mood.    Labs on Admission: I have personally reviewed following labs and imaging studies  CBC: Recent Labs  Lab 07/20/17 2227 07/20/17 2249 07/24/17 1032  WBC 10.1  --  8.3  NEUTROABS 6.4  --   --   HGB 13.3 12.9* 13.9  HCT 40.6 38.0* 42.9  MCV 87.9  --  86.5  PLT 214  --  761   Basic Metabolic Panel: Recent Labs  Lab 07/20/17 2227 07/20/17 2249 07/24/17 1032  NA 139 143 140  K 4.1 4.0 3.9  CL 105 105 107  CO2 25  --  25  GLUCOSE 278* 272* 244*  BUN 23* 22* 14  CREATININE 1.26* 1.20 1.13  CALCIUM 9.4  --  9.2   GFR: Estimated Creatinine Clearance: 69.5 mL/min (by C-G formula based on SCr of 1.13 mg/dL). Liver Function Tests: Recent Labs  Lab 07/20/17 2227  AST 17  ALT 17  ALKPHOS 65  BILITOT 0.5  PROT 6.7  ALBUMIN 3.6   No results for input(s): LIPASE, AMYLASE in the last 168 hours. No results for input(s): AMMONIA in the last 168 hours. Coagulation Profile: Recent Labs  Lab 07/20/17 2227  INR 0.97   Cardiac Enzymes: Recent Labs  Lab 07/20/17 2227 07/24/17 1528  CKTOTAL 123  --   CKMB 3.4  --   TROPONINI  --  <0.03   BNP (last 3 results) No results for input(s): PROBNP in the last 8760 hours. HbA1C: No results for input(s): HGBA1C in the last 72 hours. CBG: Recent Labs  Lab 07/21/17 0043 07/21/17 0237  GLUCAP 244* 184*   Lipid Profile: No results for input(s): CHOL, HDL, LDLCALC, TRIG, CHOLHDL, LDLDIRECT in the last 72 hours. Thyroid Function Tests: No results for input(s): TSH, T4TOTAL, FREET4, T3FREE, THYROIDAB in the last 72 hours. Anemia Panel: No results for input(s):  VITAMINB12, FOLATE, FERRITIN, TIBC, IRON, RETICCTPCT in the last 72 hours. Urine analysis: No results found for: COLORURINE, APPEARANCEUR, LABSPEC, PHURINE, GLUCOSEU, HGBUR, BILIRUBINUR, KETONESUR, PROTEINUR, UROBILINOGEN, NITRITE, LEUKOCYTESUR  Radiological Exams on Admission: Dg Chest 2 View  Result Date: 07/24/2017 CLINICAL DATA:  Weakness. Intermittent chest pain. Shortness of breath. EXAM: CHEST - 2 VIEW COMPARISON:  07/20/2017. FINDINGS: Mediastinum and hilar structures normal. Lungs are clear. No pleural effusion or pneumothorax. Cardiomegaly with normal pulmonary vascularity. No acute bony abnormality. Degenerative changes thoracic spine with thoracic spine scoliosis. IMPRESSION: No acute cardiopulmonary disease. Electronically Signed   By: Marcello Moores  Register   On: 07/24/2017 11:53   Mr  Angiogram Head Wo Contrast  Result Date: 07/24/2017 CLINICAL DATA:  Weakness in RIGHT leg numbness since Jul 21, 2017. History of hypertension and diabetes. EXAM: MRI HEAD WITHOUT CONTRAST MRA HEAD WITHOUT CONTRAST MRA NECK WITHOUT AND WITH CONTRAST TECHNIQUE: Multiplanar, multiecho pulse sequences of the brain and surrounding structures were obtained without intravenous contrast. Angiographic images of the Circle of Willis were obtained using MRA technique without intravenous contrast. Angiographic images of the neck were obtained using MRA technique without and with intravenous contrast. Carotid stenosis measurements (when applicable) are obtained utilizing NASCET criteria, using the distal internal carotid diameter as the denominator. CONTRAST:  69mL MULTIHANCE GADOBENATE DIMEGLUMINE 529 MG/ML IV SOLN COMPARISON:  None. FINDINGS: MRI HEAD sequences vary from mild to moderately motion degraded. INTRACRANIAL CONTENTS: 5 mm reduced diffusion LEFT superior temporal gyrus with low ADC values. No susceptibility artifact to suggest hemorrhage. RIGHT inferior basal ganglia perivascular space. The ventricles and sulci are  normal for patient's age. Faint supratentorial white matter FLAIR T2 hyperintensities compatible with mild chronic small vessel ischemic disease, less than expected for age. No suspicious parenchymal signal, masses, mass effect. No abnormal extra-axial fluid collections. No extra-axial masses. 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 SKULL AND UPPER CERVICAL SPINE: No abnormal sellar expansion. No suspicious calvarial bone marrow signal. Craniocervical junction maintained. SINUSES/ORBITS: Moderate paranasal sinusitis. Mastoid air cells are well aerated. Included ocular globes and orbital contents are non-suspicious. OTHER: None. MRA HEAD FINDINGS-moderately motion degraded examination. ANTERIOR CIRCULATION: Normal flow related enhancement of the included cervical, petrous, cavernous and supraclinoid internal carotid arteries. Patent anterior communicating artery. Azygos versus occluded LEFT ACA with moderate tandem stenosis. Patent bilateral middle cerebral arteries with moderate tandem stenosis. No large vessel occlusion, flow limiting stenosis. POSTERIOR CIRCULATION: Loss of LEFT V4 vertebral artery flow related enhancement. Basilar artery is patent, with normal flow related enhancement of the main branch vessels. Patent posterior cerebral arteries. No flow limiting stenosis. ANATOMIC VARIANTS: Hypoplastic LEFT A1 segment. Source images and MIP images were reviewed. MRA NECK FINDINGS-moderate motion degraded examination. ANTERIOR CIRCULATION: The common carotid arteries are widely patent bilaterally. The carotid bifurcations are patent bilaterally without hemodynamically significant stenosis by NASCET criteria. No flow limiting stenosis or luminal irregularity. POSTERIOR CIRCULATION: Bilateral vertebral arteries are patent to the vertebrobasilar junction, moderate tandem. No flow limiting stenosis or luminal irregularity. Source images and MIP images were reviewed. IMPRESSION:  MRI HEAD: 1. Mildly motion degraded examination. Subcentimeter acute LEFT temporal lobe infarct. 2. Otherwise noncontrast MRI of the head for age. MRA HEAD: 1. Moderately motion degraded examination. Occluded LEFT vertebral artery. 2. 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 3. Moderate intracranial atherosclerosis. MRA NECK: 1. No hemodynamically significant stenosis ICA on this motion degraded examination. 2. Moderate tandem stenosis bilateral vertebral arteries. Electronically Signed   By: Elon Alas M.D.   On: 07/24/2017 18:27   Mr Angiogram Neck W Or Wo Contrast  Result Date: 07/24/2017 CLINICAL DATA:  Weakness in RIGHT leg numbness since Jul 21, 2017. History of hypertension and diabetes. EXAM: MRI HEAD WITHOUT CONTRAST MRA HEAD WITHOUT CONTRAST MRA NECK WITHOUT AND WITH CONTRAST TECHNIQUE: Multiplanar, multiecho pulse sequences of the brain and surrounding structures were obtained without intravenous contrast. Angiographic images of the Circle of Willis were obtained using MRA technique without intravenous contrast. Angiographic images of the neck were obtained using MRA technique without and with intravenous contrast. Carotid stenosis measurements (when applicable) are obtained utilizing NASCET criteria, using the distal internal carotid diameter as the denominator. CONTRAST:  24mL MULTIHANCE GADOBENATE DIMEGLUMINE 529 MG/ML IV SOLN COMPARISON:  None. FINDINGS: MRI HEAD sequences vary from mild to moderately motion degraded. INTRACRANIAL CONTENTS: 5 mm reduced diffusion LEFT superior temporal gyrus with low ADC values. No susceptibility artifact to suggest hemorrhage. RIGHT inferior basal ganglia perivascular space. The ventricles and sulci are normal for patient's age. Faint supratentorial white matter FLAIR T2 hyperintensities compatible with mild chronic small vessel ischemic disease, less than expected for age. No suspicious parenchymal signal, masses, mass effect. No abnormal extra-axial fluid  collections. No extra-axial masses. 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 SKULL AND UPPER CERVICAL SPINE: No abnormal sellar expansion. No suspicious calvarial bone marrow signal. Craniocervical junction maintained. SINUSES/ORBITS: Moderate paranasal sinusitis. Mastoid air cells are well aerated. Included ocular globes and orbital contents are non-suspicious. OTHER: None. MRA HEAD FINDINGS-moderately motion degraded examination. ANTERIOR CIRCULATION: Normal flow related enhancement of the included cervical, petrous, cavernous and supraclinoid internal carotid arteries. Patent anterior communicating artery. Azygos versus occluded LEFT ACA with moderate tandem stenosis. Patent bilateral middle cerebral arteries with moderate tandem stenosis. No large vessel occlusion, flow limiting stenosis. POSTERIOR CIRCULATION: Loss of LEFT V4 vertebral artery flow related enhancement. Basilar artery is patent, with normal flow related enhancement of the main branch vessels. Patent posterior cerebral arteries. No flow limiting stenosis. ANATOMIC VARIANTS: Hypoplastic LEFT A1 segment. Source images and MIP images were reviewed. MRA NECK FINDINGS-moderate motion degraded examination. ANTERIOR CIRCULATION: The common carotid arteries are widely patent bilaterally. The carotid bifurcations are patent bilaterally without hemodynamically significant stenosis by NASCET criteria. No flow limiting stenosis or luminal irregularity. POSTERIOR CIRCULATION: Bilateral vertebral arteries are patent to the vertebrobasilar junction, moderate tandem. No flow limiting stenosis or luminal irregularity. Source images and MIP images were reviewed. IMPRESSION: MRI HEAD: 1. Mildly motion degraded examination. Subcentimeter acute LEFT temporal lobe infarct. 2. Otherwise noncontrast MRI of the head for age. MRA HEAD: 1. Moderately motion degraded examination. Occluded LEFT vertebral artery. 2. Azygos versus occluded  LEFT ACA. 3. Moderate intracranial atherosclerosis. MRA NECK: 1. No hemodynamically significant stenosis ICA on this motion degraded examination. 2. Moderate tandem stenosis bilateral vertebral arteries. Electronically Signed   By: Elon Alas M.D.   On: 07/24/2017 18:27   Mr Brain Wo Contrast  Result Date: 07/24/2017 CLINICAL DATA:  Weakness in RIGHT leg numbness since Jul 21, 2017. History of hypertension and diabetes. EXAM: MRI HEAD WITHOUT CONTRAST MRA HEAD WITHOUT CONTRAST MRA NECK WITHOUT AND WITH CONTRAST TECHNIQUE: Multiplanar, multiecho pulse sequences of the brain and surrounding structures were obtained without intravenous contrast. Angiographic images of the Circle of Willis were obtained using MRA technique without intravenous contrast. Angiographic images of the neck were obtained using MRA technique without and with intravenous contrast. Carotid stenosis measurements (when applicable) are obtained utilizing NASCET criteria, using the distal internal carotid diameter as the denominator. CONTRAST:  35mL MULTIHANCE GADOBENATE DIMEGLUMINE 529 MG/ML IV SOLN COMPARISON:  None. FINDINGS: MRI HEAD sequences vary from mild to moderately motion degraded. INTRACRANIAL CONTENTS: 5 mm reduced diffusion LEFT superior temporal gyrus with low ADC values. No susceptibility artifact to suggest hemorrhage. RIGHT inferior basal ganglia perivascular space. The ventricles and sulci are normal for patient's age. Faint supratentorial white matter FLAIR T2 hyperintensities compatible with mild chronic small vessel ischemic disease, less than expected for age. No suspicious parenchymal signal, masses, mass effect. No abnormal extra-axial fluid collections. No extra-axial masses. 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 SKULL AND UPPER CERVICAL SPINE: No abnormal sellar  expansion. No suspicious calvarial bone marrow signal. Craniocervical junction maintained. SINUSES/ORBITS: Moderate  paranasal sinusitis. Mastoid air cells are well aerated. Included ocular globes and orbital contents are non-suspicious. OTHER: None. MRA HEAD FINDINGS-moderately motion degraded examination. ANTERIOR CIRCULATION: Normal flow related enhancement of the included cervical, petrous, cavernous and supraclinoid internal carotid arteries. Patent anterior communicating artery. Azygos versus occluded LEFT ACA with moderate tandem stenosis. Patent bilateral middle cerebral arteries with moderate tandem stenosis. No large vessel occlusion, flow limiting stenosis. POSTERIOR CIRCULATION: Loss of LEFT V4 vertebral artery flow related enhancement. Basilar artery is patent, with normal flow related enhancement of the main branch vessels. Patent posterior cerebral arteries. No flow limiting stenosis. ANATOMIC VARIANTS: Hypoplastic LEFT A1 segment. Source images and MIP images were reviewed. MRA NECK FINDINGS-moderate motion degraded examination. ANTERIOR CIRCULATION: The common carotid arteries are widely patent bilaterally. The carotid bifurcations are patent bilaterally without hemodynamically significant stenosis by NASCET criteria. No flow limiting stenosis or luminal irregularity. POSTERIOR CIRCULATION: Bilateral vertebral arteries are patent to the vertebrobasilar junction, moderate tandem. No flow limiting stenosis or luminal irregularity. Source images and MIP images were reviewed. IMPRESSION: MRI HEAD: 1. Mildly motion degraded examination. Subcentimeter acute LEFT temporal lobe infarct. 2. Otherwise noncontrast MRI of the head for age. MRA HEAD: 1. Moderately motion degraded examination. Occluded LEFT vertebral artery. 2. 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 3. Moderate intracranial atherosclerosis. MRA NECK: 1. No hemodynamically significant stenosis ICA on this motion degraded examination. 2. Moderate tandem stenosis bilateral vertebral arteries. Electronically Signed   By: Elon Alas M.D.   On: 07/24/2017 18:27     EKG: Independently reviewed.  Assessment/Plan Principal Problem:   Acute ischemic stroke [facility]) Active Problems:   Hypertension   Diabetes mellitus without complication (HCC)   Sinus bradycardia    1. Acute ischemic stroke - 1. Stroke pathway 2. 2d echo 3. Tele monitor 4. ASA 5. PT/OT/SLP 6. Neuro consult 7. FLP, A1C, continue statin for now 2. DM2 - 1. Continue metformin and glipizide 3. HTN - 1. Hold BP meds and allow permissive HTN 4. Sinus bradycardia - 1. Chronic, asymptomatic, baseline, and stable for years.  DVT prophylaxis: Lovenox Code Status: Full Family Communication: Family at bedside Disposition Plan: Home after admit Consults called: Neuro Admission status: Admit to inpatient - IP status for imaging confirmed stroke.   Etta Quill DO [facility] Pager [phone]  If 7AM-7PM, please contact day team taking care of patient [email or web address] Password [facility]  07/24/2017, 8:29 PM

## 2017-07-25 ENCOUNTER — Inpatient Hospital Stay (HOSPITAL_COMMUNITY): Payer: Medicare Other

## 2017-07-25 DIAGNOSIS — I5030 Unspecified diastolic (congestive) heart failure: Secondary | ICD-10-CM

## 2017-07-25 LAB — HEMOGLOBIN A1C
HEMOGLOBIN A1C: 8 % — AB (ref 4.8–5.6)
MEAN PLASMA GLUCOSE: 182.9 mg/dL

## 2017-07-25 LAB — GLUCOSE, CAPILLARY
Glucose-Capillary: 197 mg/dL — ABNORMAL HIGH (ref 65–99)
Glucose-Capillary: 136 mg/dL — ABNORMAL HIGH (ref 65–99)
Glucose-Capillary: 103 mg/dL — ABNORMAL HIGH (ref 65–99)
Glucose-Capillary: 172 mg/dL — ABNORMAL HIGH (ref 65–99)

## 2017-07-25 LAB — LIPID PANEL
CHOLESTEROL: 123 mg/dL (ref 0–200)
HDL: 27 mg/dL — ABNORMAL LOW (ref >40)
LDL Cholesterol: 41 mg/dL (ref 0–99)
Total CHOL/HDL Ratio: 4.6 RATIO
Triglycerides: 273 mg/dL — ABNORMAL HIGH (ref <150)
VLDL: 55 mg/dL — AB (ref 0–40)

## 2017-07-25 LAB — ECHOCARDIOGRAM COMPLETE
HEIGHTINCHES: 73 in
Weight: 3562.63 oz

## 2017-07-25 MED ORDER — CLOPIDOGREL BISULFATE 75 MG PO TABS
75.0000 mg | ORAL_TABLET | Freq: Every day | ORAL | Status: DC
  Administered 2017-07-25 – 2017-07-26 (×2): 75 mg via ORAL
  Filled 2017-07-25 (×2): qty 1

## 2017-07-25 MED ORDER — ASPIRIN EC 81 MG PO TBEC
81.0000 mg | DELAYED_RELEASE_TABLET | Freq: Every day | ORAL | Status: DC
  Administered 2017-07-26: 81 mg via ORAL
  Filled 2017-07-25: qty 1

## 2017-07-25 NOTE — Progress Notes (Signed)
PROGRESS NOTE    Patient: Patrick Phillips     PCP: System, Pcp Not In                    DOB: 06-01-1940            DOA: 07/24/2017 OXB:[id number]             DOS: 07/25/2017, 2:27 PM   LOS: 1 day   Date of Service: The patient was seen and examined on 07/25/2017  Subjective:   Patient was seen and examined this morning, stable no acute distress.  No residual finding of stroke. Wife and daughter present at bedside Denies any chest pain, headaches, visual changes, or asymmetric weaknesses.   ----------------------------------------------------------------------------------------------------------------------  Brief Narrative:   Euclid Cassetta is a 77 y.o. male with medical history significant of HTN, DM2.  Patient initially seen 5/25 at [facility] with CP and R sided numbness and weakness.  Denies any symptoms today.  Patient stated that on Friday his right arm felt like it was going to fall off. He noticed the weakness first and then later it became numb. Called his daughter about 8 pm that night and they went to [employer]. At [facility] Second Troponin was 0.11. He was told at [facility] that he would be admitted and transferred to [facility]. Patient did not want to be admitted and left [place]. Today daughter called PCP, and his PCP told him to come back to the hospital so he came to [facility]. Patient denies also denies any SOB, vision changes Patient states that on Friday he has spent all day in 90+ degree weather mowing the lawn and tending to his yard. He also had to mow along an uneven ditch. Per patient this is the reason for his right side being weak.   MRI brain confirms punctate acute ischemic stroke L temporal lobe.  MRA head and neck shows moderate atherosclerosis.  No hemodynamically significant ICA stenosis bilaterally.  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  ============================================================================  Principal Problem:   Acute ischemic stroke [facility]) Active Problems:   Hypertension   Diabetes mellitus without complication ([place])   Sinus bradycardia   Assessment & Plan:     Acute ischemic stroke (HCC) -MRI of the brain was reviewed, confirming ischemic stroke in the left temporal lobe, -MRA of the head and neck revealed moderate atherosclerosis, -Neurology consulted -No significant residual findings, continue supportive therapy, aspirin, statins -Continue with neuro work-up, pending 2D echocardiogram -Ending PT OT/speech    Hypertension  -Allowing permissive hypertension holding blood pressure medications     Diabetes mellitus without complication (HCC) -Globin A1c 8, checking his blood sugar q. AC/at bedtime, SSI -Any home medication of metformin and glipizide    Sinus bradycardia -Currently stable, asymptomatic, heart rate 51-52 overnight 48-57   DVT prophylaxis: Lovenox Code Status: Full Family Communication: Family at bedside Disposition Plan: Home after admit Consults called: Neuro Admission status: Admit to inpatient - IP status for imaging confirmed stroke.   Antimicrobials: none  Objective: Vitals:   07/24/17 2338 07/25/17 0349 07/25/17 0808 07/25/17 1311  BP: (!) 156/75 (!) 158/79 (!) 171/70 (!) 150/66  Pulse: (!) 49 (!) 48 (!) 51 (!) 52  Resp: 18 18 18 18   Temp: 97.9 F (36.6 C) 98.2 F (36.8 C) 98.6 F (37 C) (!) 97.5 F (36.4 C)  TempSrc: Oral Oral Oral Oral  SpO2: 97%  99% 97% 96%  Weight: 101 kg (222 lb 10.6 oz)     Height: 6\' 1"  (1.854 m)      No intake or output data in the 24 hours ending 07/25/17 1427 Filed Weights   07/24/17 2338  Weight: 101 kg (222 lb 10.6 oz)    Examination:  General exam: Appears calm and comfortable  Psychiatry: Judgement and insight appear normal. Mood & affect appropriate. HEENT: WNLs Respiratory system: Clear to  auscultation. Respiratory effort normal. Cardiovascular system: S1 & S2 heard, RRR. No JVD, murmurs, rubs, gallops or clicks. No pedal edema. Gastrointestinal system: Abd. nondistended, soft and nontender. No organomegaly or masses felt. Normal bowel sounds heard. Central nervous system: Alert and oriented. No focal neurological deficits. Extremities: Symmetric 5 x 5 power. Skin: No rashes, lesions or ulcers Wounds:   Data Reviewed: I have personally reviewed following labs and imaging studies  CBC: Recent Labs  Lab 07/20/17 2227 07/20/17 2249 07/24/17 1032  WBC 10.1  --  8.3  NEUTROABS 6.4  --   --   HGB 13.3 12.9* 13.9  HCT 40.6 38.0* 42.9  MCV 87.9  --  86.5  PLT 214  --  240   Basic Metabolic Panel: Recent Labs  Lab 07/20/17 2227 07/20/17 2249 07/24/17 1032  NA 139 143 140  K 4.1 4.0 3.9  CL 105 105 107  CO2 25  --  25  GLUCOSE 278* 272* 244*  BUN 23* 22* 14  CREATININE 1.26* 1.20 1.13  CALCIUM 9.4  --  9.2   GFR: Estimated Creatinine Clearance: 69.5 mL/min (by C-G formula based on SCr of 1.13 mg/dL). Liver Function Tests: Recent Labs  Lab 07/20/17 2227  AST 17  ALT 17  ALKPHOS 65  BILITOT 0.5  PROT 6.7  ALBUMIN 3.6   No results for input(s): LIPASE, AMYLASE in the last 168 hours. No results for input(s): AMMONIA in the last 168 hours. Coagulation Profile: Recent Labs  Lab 07/20/17 2227  INR 0.97   Cardiac Enzymes: Recent Labs  Lab 07/20/17 2227 07/24/17 1528  CKTOTAL 123  --   CKMB 3.4  --   TROPONINI  --  <0.03   BNP (last 3 results) No results for input(s): PROBNP in the last 8760 hours. HbA1C: Recent Labs    07/25/17 0655  HGBA1C 8.0*   CBG: Recent Labs  Lab 07/21/17 0043 07/21/17 0237 07/24/17 2157 07/25/17 0603 07/25/17 1154  GLUCAP 244* 184* 177* 197* 136*   Lipid Profile: Recent Labs    07/25/17 0655  CHOL 123  HDL 27*  LDLCALC 41  TRIG 273*  CHOLHDL 4.6   No results found for this or any previous visit (from  the past 240 hour(s)).    Radiology Studies: Dg Chest 2 View  Result Date: 07/24/2017 CLINICAL DATA:  Weakness. Intermittent chest pain. Shortness of breath. EXAM: CHEST - 2 VIEW COMPARISON:  07/20/2017. FINDINGS: Mediastinum and hilar structures normal. Lungs are clear. No pleural effusion or pneumothorax. Cardiomegaly with normal pulmonary vascularity. No acute bony abnormality. Degenerative changes thoracic spine with thoracic spine scoliosis. IMPRESSION: No acute cardiopulmonary disease. Electronically Signed   By: Marcello Moores  Register   On: 07/24/2017 11:53   Mr Angiogram Head Wo Contrast  Result Date: 07/24/2017 CLINICAL DATA:  Weakness in RIGHT leg numbness since Jul 21, 2017. History of hypertension and diabetes. EXAM: MRI HEAD WITHOUT CONTRAST MRA HEAD WITHOUT CONTRAST MRA NECK WITHOUT AND WITH CONTRAST TECHNIQUE: Multiplanar, multiecho pulse sequences of the brain and surrounding structures  were obtained without intravenous contrast. Angiographic images of the Circle of Willis were obtained using MRA technique without intravenous contrast. Angiographic images of the neck were obtained using MRA technique without and with intravenous contrast. Carotid stenosis measurements (when applicable) are obtained utilizing NASCET criteria, using the distal internal carotid diameter as the denominator. CONTRAST:  69mL MULTIHANCE GADOBENATE DIMEGLUMINE 529 MG/ML IV SOLN COMPARISON:  None. FINDINGS: MRI HEAD sequences vary from mild to moderately motion degraded. INTRACRANIAL CONTENTS: 5 mm reduced diffusion LEFT superior temporal gyrus with low ADC values. No susceptibility artifact to suggest hemorrhage. RIGHT inferior basal ganglia perivascular space. The ventricles and sulci are normal for patient's age. Faint supratentorial white matter FLAIR T2 hyperintensities compatible with mild chronic small vessel ischemic disease, less than expected for age. No suspicious parenchymal signal, masses, mass effect. No  abnormal extra-axial fluid collections. No extra-axial masses. 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 SKULL AND UPPER CERVICAL SPINE: No abnormal sellar expansion. No suspicious calvarial bone marrow signal. Craniocervical junction maintained. SINUSES/ORBITS: Moderate paranasal sinusitis. Mastoid air cells are well aerated. Included ocular globes and orbital contents are non-suspicious. OTHER: None. MRA HEAD FINDINGS-moderately motion degraded examination. ANTERIOR CIRCULATION: Normal flow related enhancement of the included cervical, petrous, cavernous and supraclinoid internal carotid arteries. Patent anterior communicating artery. Azygos versus occluded LEFT ACA with moderate tandem stenosis. Patent bilateral middle cerebral arteries with moderate tandem stenosis. No large vessel occlusion, flow limiting stenosis. POSTERIOR CIRCULATION: Loss of LEFT V4 vertebral artery flow related enhancement. Basilar artery is patent, with normal flow related enhancement of the main branch vessels. Patent posterior cerebral arteries. No flow limiting stenosis. ANATOMIC VARIANTS: Hypoplastic LEFT A1 segment. Source images and MIP images were reviewed. MRA NECK FINDINGS-moderate motion degraded examination. ANTERIOR CIRCULATION: The common carotid arteries are widely patent bilaterally. The carotid bifurcations are patent bilaterally without hemodynamically significant stenosis by NASCET criteria. No flow limiting stenosis or luminal irregularity. POSTERIOR CIRCULATION: Bilateral vertebral arteries are patent to the vertebrobasilar junction, moderate tandem. No flow limiting stenosis or luminal irregularity. Source images and MIP images were reviewed. IMPRESSION: MRI HEAD: 1. Mildly motion degraded examination. Subcentimeter acute LEFT temporal lobe infarct. 2. Otherwise noncontrast MRI of the head for age. MRA HEAD: 1. Moderately motion degraded examination. Occluded LEFT vertebral artery. 2.  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 3. Moderate intracranial atherosclerosis. MRA NECK: 1. No hemodynamically significant stenosis ICA on this motion degraded examination. 2. Moderate tandem stenosis bilateral vertebral arteries. Electronically Signed   By: Elon Alas M.D.   On: 07/24/2017 18:27   Mr Angiogram Neck W Or Wo Contrast  Result Date: 07/24/2017 CLINICAL DATA:  Weakness in RIGHT leg numbness since Jul 21, 2017. History of hypertension and diabetes. EXAM: MRI HEAD WITHOUT CONTRAST MRA HEAD WITHOUT CONTRAST MRA NECK WITHOUT AND WITH CONTRAST TECHNIQUE: Multiplanar, multiecho pulse sequences of the brain and surrounding structures were obtained without intravenous contrast. Angiographic images of the Circle of Willis were obtained using MRA technique without intravenous contrast. Angiographic images of the neck were obtained using MRA technique without and with intravenous contrast. Carotid stenosis measurements (when applicable) are obtained utilizing NASCET criteria, using the distal internal carotid diameter as the denominator. CONTRAST:  4mL MULTIHANCE GADOBENATE DIMEGLUMINE 529 MG/ML IV SOLN COMPARISON:  None. FINDINGS: MRI HEAD sequences vary from mild to moderately motion degraded. INTRACRANIAL CONTENTS: 5 mm reduced diffusion LEFT superior temporal gyrus with low ADC values. No susceptibility artifact to suggest hemorrhage. RIGHT inferior basal ganglia perivascular space. The ventricles and  sulci are normal for patient's age. Faint supratentorial white matter FLAIR T2 hyperintensities compatible with mild chronic small vessel ischemic disease, less than expected for age. No suspicious parenchymal signal, masses, mass effect. No abnormal extra-axial fluid collections. No extra-axial masses. 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 SKULL AND UPPER CERVICAL SPINE: No abnormal sellar expansion. No suspicious calvarial bone marrow signal. Craniocervical junction  maintained. SINUSES/ORBITS: Moderate paranasal sinusitis. Mastoid air cells are well aerated. Included ocular globes and orbital contents are non-suspicious. OTHER: None. MRA HEAD FINDINGS-moderately motion degraded examination. ANTERIOR CIRCULATION: Normal flow related enhancement of the included cervical, petrous, cavernous and supraclinoid internal carotid arteries. Patent anterior communicating artery. Azygos versus occluded LEFT ACA with moderate tandem stenosis. Patent bilateral middle cerebral arteries with moderate tandem stenosis. No large vessel occlusion, flow limiting stenosis. POSTERIOR CIRCULATION: Loss of LEFT V4 vertebral artery flow related enhancement. Basilar artery is patent, with normal flow related enhancement of the main branch vessels. Patent posterior cerebral arteries. No flow limiting stenosis. ANATOMIC VARIANTS: Hypoplastic LEFT A1 segment. Source images and MIP images were reviewed. MRA NECK FINDINGS-moderate motion degraded examination. ANTERIOR CIRCULATION: The common carotid arteries are widely patent bilaterally. The carotid bifurcations are patent bilaterally without hemodynamically significant stenosis by NASCET criteria. No flow limiting stenosis or luminal irregularity. POSTERIOR CIRCULATION: Bilateral vertebral arteries are patent to the vertebrobasilar junction, moderate tandem. No flow limiting stenosis or luminal irregularity. Source images and MIP images were reviewed. IMPRESSION: MRI HEAD: 1. Mildly motion degraded examination. Subcentimeter acute LEFT temporal lobe infarct. 2. Otherwise noncontrast MRI of the head for age. MRA HEAD: 1. Moderately motion degraded examination. Occluded LEFT vertebral artery. 2. 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 3. Moderate intracranial atherosclerosis. MRA NECK: 1. No hemodynamically significant stenosis ICA on this motion degraded examination. 2. Moderate tandem stenosis bilateral vertebral arteries. Electronically Signed   By: Elon Alas M.D.   On: 07/24/2017 18:27   Mr Brain Wo Contrast  Result Date: 07/24/2017 CLINICAL DATA:  Weakness in RIGHT leg numbness since Jul 21, 2017. History of hypertension and diabetes. EXAM: MRI HEAD WITHOUT CONTRAST MRA HEAD WITHOUT CONTRAST MRA NECK WITHOUT AND WITH CONTRAST TECHNIQUE: Multiplanar, multiecho pulse sequences of the brain and surrounding structures were obtained without intravenous contrast. Angiographic images of the Circle of Willis were obtained using MRA technique without intravenous contrast. Angiographic images of the neck were obtained using MRA technique without and with intravenous contrast. Carotid stenosis measurements (when applicable) are obtained utilizing NASCET criteria, using the distal internal carotid diameter as the denominator. CONTRAST:  69mL MULTIHANCE GADOBENATE DIMEGLUMINE 529 MG/ML IV SOLN COMPARISON:  None. FINDINGS: MRI HEAD sequences vary from mild to moderately motion degraded. INTRACRANIAL CONTENTS: 5 mm reduced diffusion LEFT superior temporal gyrus with low ADC values. No susceptibility artifact to suggest hemorrhage. RIGHT inferior basal ganglia perivascular space. The ventricles and sulci are normal for patient's age. Faint supratentorial white matter FLAIR T2 hyperintensities compatible with mild chronic small vessel ischemic disease, less than expected for age. No suspicious parenchymal signal, masses, mass effect. No abnormal extra-axial fluid collections. No extra-axial masses. 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 SKULL AND UPPER CERVICAL SPINE: No abnormal sellar expansion. No suspicious calvarial bone marrow signal. Craniocervical junction maintained. SINUSES/ORBITS: Moderate paranasal sinusitis. Mastoid air cells are well aerated. Included ocular globes and orbital contents are non-suspicious. OTHER: None. MRA HEAD FINDINGS-moderately motion degraded examination. ANTERIOR CIRCULATION: Normal flow related  enhancement of the included cervical, petrous, cavernous and supraclinoid  internal carotid arteries. Patent anterior communicating artery. Azygos versus occluded LEFT ACA with moderate tandem stenosis. Patent bilateral middle cerebral arteries with moderate tandem stenosis. No large vessel occlusion, flow limiting stenosis. POSTERIOR CIRCULATION: Loss of LEFT V4 vertebral artery flow related enhancement. Basilar artery is patent, with normal flow related enhancement of the main branch vessels. Patent posterior cerebral arteries. No flow limiting stenosis. ANATOMIC VARIANTS: Hypoplastic LEFT A1 segment. Source images and MIP images were reviewed. MRA NECK FINDINGS-moderate motion degraded examination. ANTERIOR CIRCULATION: The common carotid arteries are widely patent bilaterally. The carotid bifurcations are patent bilaterally without hemodynamically significant stenosis by NASCET criteria. No flow limiting stenosis or luminal irregularity. POSTERIOR CIRCULATION: Bilateral vertebral arteries are patent to the vertebrobasilar junction, moderate tandem. No flow limiting stenosis or luminal irregularity. Source images and MIP images were reviewed. IMPRESSION: MRI HEAD: 1. Mildly motion degraded examination. Subcentimeter acute LEFT temporal lobe infarct. 2. Otherwise noncontrast MRI of the head for age. MRA HEAD: 1. Moderately motion degraded examination. Occluded LEFT vertebral artery. 2. 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 3. Moderate intracranial atherosclerosis. MRA NECK: 1. No hemodynamically significant stenosis ICA on this motion degraded examination. 2. Moderate tandem stenosis bilateral vertebral arteries. Electronically Signed   By: Elon Alas M.D.   On: 07/24/2017 18:27    Scheduled Meds: . aspirin  300 mg Rectal Daily   Or  . aspirin  325 mg Oral Daily  . atorvastatin  20 mg Oral Daily  . enoxaparin (LOVENOX) injection  40 mg Subcutaneous Q24H  . escitalopram  10 mg Oral Daily  . glipiZIDE  5  mg Oral QAC breakfast  . metFORMIN  1,000 mg Oral BID WC   Continuous Infusions:  Time spent: >25 minutes  Deatra James, MD [facility],  Pager [phone]  If 7PM-7AM, please contact night-coverage [email or web address]   Password [facility]  07/25/2017, 2:27 PM

## 2017-07-25 NOTE — H&P (View-Only) (Signed)
PROGRESS NOTE    Patient: Patrick Phillips     PCP: System, Pcp Not In                    DOB: 10/16/1940            DOA: 07/24/2017 JJH:[id number]             DOS: 07/25/2017, 2:27 PM   LOS: 1 day   Date of Service: The patient was seen and examined on 07/25/2017  Subjective:   Patient was seen and examined this morning, stable no acute distress.  No residual finding of stroke. Wife and daughter present at bedside Denies any chest pain, headaches, visual changes, or asymmetric weaknesses.   ----------------------------------------------------------------------------------------------------------------------  Brief Narrative:   Patrick Phillips is a 77 y.o. male with medical history significant of HTN, DM2.  Patient initially seen 5/25 at [facility] with CP and R sided numbness and weakness.  Denies any symptoms today.  Patient stated that on Friday his right arm felt like it was going to fall off. He noticed the weakness first and then later it became numb. Called his daughter about 8 pm that night and they went to [employer]. At [facility] Second Troponin was 0.11. He was told at [facility] that he would be admitted and transferred to [facility]. Patient did not want to be admitted and left [place]. Today daughter called PCP, and his PCP told him to come back to the hospital so he came to [facility]. Patient denies also denies any SOB, vision changes Patient states that on Friday he has spent all day in 90+ degree weather mowing the lawn and tending to his yard. He also had to mow along an uneven ditch. Per patient this is the reason for his right side being weak.   MRI brain confirms punctate acute ischemic stroke L temporal lobe.  MRA head and neck shows moderate atherosclerosis.  No hemodynamically significant ICA stenosis bilaterally.  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  ============================================================================  Principal Problem:   Acute ischemic stroke [facility]) Active Problems:   Hypertension   Diabetes mellitus without complication ([place])   Sinus bradycardia   Assessment & Plan:     Acute ischemic stroke (HCC) -MRI of the brain was reviewed, confirming ischemic stroke in the left temporal lobe, -MRA of the head and neck revealed moderate atherosclerosis, -Neurology consulted -No significant residual findings, continue supportive therapy, aspirin, statins -Continue with neuro work-up, pending 2D echocardiogram -Ending PT OT/speech    Hypertension  -Allowing permissive hypertension holding blood pressure medications     Diabetes mellitus without complication (HCC) -Globin A1c 8, checking his blood sugar q. AC/at bedtime, SSI -Any home medication of metformin and glipizide    Sinus bradycardia -Currently stable, asymptomatic, heart rate 51-52 overnight 48-57   DVT prophylaxis: Lovenox Code Status: Full Family Communication: Family at bedside Disposition Plan: Home after admit Consults called: Neuro Admission status: Admit to inpatient - IP status for imaging confirmed stroke.   Antimicrobials: none  Objective: Vitals:   07/24/17 2338 07/25/17 0349 07/25/17 0808 07/25/17 1311  BP: (!) 156/75 (!) 158/79 (!) 171/70 (!) 150/66  Pulse: (!) 49 (!) 48 (!) 51 (!) 52  Resp: 18 18 18 18   Temp: 97.9 F (36.6 C) 98.2 F (36.8 C) 98.6 F (37 C) (!) 97.5 F (36.4 C)  TempSrc: Oral Oral Oral Oral  SpO2: 97%  99% 97% 96%  Weight: 101 kg (222 lb 10.6 oz)     Height: 6\' 1"  (1.854 m)      No intake or output data in the 24 hours ending 07/25/17 1427 Filed Weights   07/24/17 2338  Weight: 101 kg (222 lb 10.6 oz)    Examination:  General exam: Appears calm and comfortable  Psychiatry: Judgement and insight appear normal. Mood & affect appropriate. HEENT: WNLs Respiratory system: Clear to  auscultation. Respiratory effort normal. Cardiovascular system: S1 & S2 heard, RRR. No JVD, murmurs, rubs, gallops or clicks. No pedal edema. Gastrointestinal system: Abd. nondistended, soft and nontender. No organomegaly or masses felt. Normal bowel sounds heard. Central nervous system: Alert and oriented. No focal neurological deficits. Extremities: Symmetric 5 x 5 power. Skin: No rashes, lesions or ulcers Wounds:   Data Reviewed: I have personally reviewed following labs and imaging studies  CBC: Recent Labs  Lab 07/20/17 2227 07/20/17 2249 07/24/17 1032  WBC 10.1  --  8.3  NEUTROABS 6.4  --   --   HGB 13.3 12.9* 13.9  HCT 40.6 38.0* 42.9  MCV 87.9  --  86.5  PLT 214  --  601   Basic Metabolic Panel: Recent Labs  Lab 07/20/17 2227 07/20/17 2249 07/24/17 1032  NA 139 143 140  K 4.1 4.0 3.9  CL 105 105 107  CO2 25  --  25  GLUCOSE 278* 272* 244*  BUN 23* 22* 14  CREATININE 1.26* 1.20 1.13  CALCIUM 9.4  --  9.2   GFR: Estimated Creatinine Clearance: 69.5 mL/min (by C-G formula based on SCr of 1.13 mg/dL). Liver Function Tests: Recent Labs  Lab 07/20/17 2227  AST 17  ALT 17  ALKPHOS 65  BILITOT 0.5  PROT 6.7  ALBUMIN 3.6   No results for input(s): LIPASE, AMYLASE in the last 168 hours. No results for input(s): AMMONIA in the last 168 hours. Coagulation Profile: Recent Labs  Lab 07/20/17 2227  INR 0.97   Cardiac Enzymes: Recent Labs  Lab 07/20/17 2227 07/24/17 1528  CKTOTAL 123  --   CKMB 3.4  --   TROPONINI  --  <0.03   BNP (last 3 results) No results for input(s): PROBNP in the last 8760 hours. HbA1C: Recent Labs    07/25/17 0655  HGBA1C 8.0*   CBG: Recent Labs  Lab 07/21/17 0043 07/21/17 0237 07/24/17 2157 07/25/17 0603 07/25/17 1154  GLUCAP 244* 184* 177* 197* 136*   Lipid Profile: Recent Labs    07/25/17 0655  CHOL 123  HDL 27*  LDLCALC 41  TRIG 273*  CHOLHDL 4.6   No results found for this or any previous visit (from  the past 240 hour(s)).    Radiology Studies: Dg Chest 2 View  Result Date: 07/24/2017 CLINICAL DATA:  Weakness. Intermittent chest pain. Shortness of breath. EXAM: CHEST - 2 VIEW COMPARISON:  07/20/2017. FINDINGS: Mediastinum and hilar structures normal. Lungs are clear. No pleural effusion or pneumothorax. Cardiomegaly with normal pulmonary vascularity. No acute bony abnormality. Degenerative changes thoracic spine with thoracic spine scoliosis. IMPRESSION: No acute cardiopulmonary disease. Electronically Signed   By: Marcello Moores  Register   On: 07/24/2017 11:53   Mr Angiogram Head Wo Contrast  Result Date: 07/24/2017 CLINICAL DATA:  Weakness in RIGHT leg numbness since Jul 21, 2017. History of hypertension and diabetes. EXAM: MRI HEAD WITHOUT CONTRAST MRA HEAD WITHOUT CONTRAST MRA NECK WITHOUT AND WITH CONTRAST TECHNIQUE: Multiplanar, multiecho pulse sequences of the brain and surrounding structures  were obtained without intravenous contrast. Angiographic images of the Circle of Willis were obtained using MRA technique without intravenous contrast. Angiographic images of the neck were obtained using MRA technique without and with intravenous contrast. Carotid stenosis measurements (when applicable) are obtained utilizing NASCET criteria, using the distal internal carotid diameter as the denominator. CONTRAST:  12mL MULTIHANCE GADOBENATE DIMEGLUMINE 529 MG/ML IV SOLN COMPARISON:  None. FINDINGS: MRI HEAD sequences vary from mild to moderately motion degraded. INTRACRANIAL CONTENTS: 5 mm reduced diffusion LEFT superior temporal gyrus with low ADC values. No susceptibility artifact to suggest hemorrhage. RIGHT inferior basal ganglia perivascular space. The ventricles and sulci are normal for patient's age. Faint supratentorial white matter FLAIR T2 hyperintensities compatible with mild chronic small vessel ischemic disease, less than expected for age. No suspicious parenchymal signal, masses, mass effect. No  abnormal extra-axial fluid collections. No extra-axial masses. 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 SKULL AND UPPER CERVICAL SPINE: No abnormal sellar expansion. No suspicious calvarial bone marrow signal. Craniocervical junction maintained. SINUSES/ORBITS: Moderate paranasal sinusitis. Mastoid air cells are well aerated. Included ocular globes and orbital contents are non-suspicious. OTHER: None. MRA HEAD FINDINGS-moderately motion degraded examination. ANTERIOR CIRCULATION: Normal flow related enhancement of the included cervical, petrous, cavernous and supraclinoid internal carotid arteries. Patent anterior communicating artery. Azygos versus occluded LEFT ACA with moderate tandem stenosis. Patent bilateral middle cerebral arteries with moderate tandem stenosis. No large vessel occlusion, flow limiting stenosis. POSTERIOR CIRCULATION: Loss of LEFT V4 vertebral artery flow related enhancement. Basilar artery is patent, with normal flow related enhancement of the main branch vessels. Patent posterior cerebral arteries. No flow limiting stenosis. ANATOMIC VARIANTS: Hypoplastic LEFT A1 segment. Source images and MIP images were reviewed. MRA NECK FINDINGS-moderate motion degraded examination. ANTERIOR CIRCULATION: The common carotid arteries are widely patent bilaterally. The carotid bifurcations are patent bilaterally without hemodynamically significant stenosis by NASCET criteria. No flow limiting stenosis or luminal irregularity. POSTERIOR CIRCULATION: Bilateral vertebral arteries are patent to the vertebrobasilar junction, moderate tandem. No flow limiting stenosis or luminal irregularity. Source images and MIP images were reviewed. IMPRESSION: MRI HEAD: 1. Mildly motion degraded examination. Subcentimeter acute LEFT temporal lobe infarct. 2. Otherwise noncontrast MRI of the head for age. MRA HEAD: 1. Moderately motion degraded examination. Occluded LEFT vertebral artery. 2.  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 3. Moderate intracranial atherosclerosis. MRA NECK: 1. No hemodynamically significant stenosis ICA on this motion degraded examination. 2. Moderate tandem stenosis bilateral vertebral arteries. Electronically Signed   By: Elon Alas M.D.   On: 07/24/2017 18:27   Mr Angiogram Neck W Or Wo Contrast  Result Date: 07/24/2017 CLINICAL DATA:  Weakness in RIGHT leg numbness since Jul 21, 2017. History of hypertension and diabetes. EXAM: MRI HEAD WITHOUT CONTRAST MRA HEAD WITHOUT CONTRAST MRA NECK WITHOUT AND WITH CONTRAST TECHNIQUE: Multiplanar, multiecho pulse sequences of the brain and surrounding structures were obtained without intravenous contrast. Angiographic images of the Circle of Willis were obtained using MRA technique without intravenous contrast. Angiographic images of the neck were obtained using MRA technique without and with intravenous contrast. Carotid stenosis measurements (when applicable) are obtained utilizing NASCET criteria, using the distal internal carotid diameter as the denominator. CONTRAST:  25mL MULTIHANCE GADOBENATE DIMEGLUMINE 529 MG/ML IV SOLN COMPARISON:  None. FINDINGS: MRI HEAD sequences vary from mild to moderately motion degraded. INTRACRANIAL CONTENTS: 5 mm reduced diffusion LEFT superior temporal gyrus with low ADC values. No susceptibility artifact to suggest hemorrhage. RIGHT inferior basal ganglia perivascular space. The ventricles and  sulci are normal for patient's age. Faint supratentorial white matter FLAIR T2 hyperintensities compatible with mild chronic small vessel ischemic disease, less than expected for age. No suspicious parenchymal signal, masses, mass effect. No abnormal extra-axial fluid collections. No extra-axial masses. 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 SKULL AND UPPER CERVICAL SPINE: No abnormal sellar expansion. No suspicious calvarial bone marrow signal. Craniocervical junction  maintained. SINUSES/ORBITS: Moderate paranasal sinusitis. Mastoid air cells are well aerated. Included ocular globes and orbital contents are non-suspicious. OTHER: None. MRA HEAD FINDINGS-moderately motion degraded examination. ANTERIOR CIRCULATION: Normal flow related enhancement of the included cervical, petrous, cavernous and supraclinoid internal carotid arteries. Patent anterior communicating artery. Azygos versus occluded LEFT ACA with moderate tandem stenosis. Patent bilateral middle cerebral arteries with moderate tandem stenosis. No large vessel occlusion, flow limiting stenosis. POSTERIOR CIRCULATION: Loss of LEFT V4 vertebral artery flow related enhancement. Basilar artery is patent, with normal flow related enhancement of the main branch vessels. Patent posterior cerebral arteries. No flow limiting stenosis. ANATOMIC VARIANTS: Hypoplastic LEFT A1 segment. Source images and MIP images were reviewed. MRA NECK FINDINGS-moderate motion degraded examination. ANTERIOR CIRCULATION: The common carotid arteries are widely patent bilaterally. The carotid bifurcations are patent bilaterally without hemodynamically significant stenosis by NASCET criteria. No flow limiting stenosis or luminal irregularity. POSTERIOR CIRCULATION: Bilateral vertebral arteries are patent to the vertebrobasilar junction, moderate tandem. No flow limiting stenosis or luminal irregularity. Source images and MIP images were reviewed. IMPRESSION: MRI HEAD: 1. Mildly motion degraded examination. Subcentimeter acute LEFT temporal lobe infarct. 2. Otherwise noncontrast MRI of the head for age. MRA HEAD: 1. Moderately motion degraded examination. Occluded LEFT vertebral artery. 2. 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 3. Moderate intracranial atherosclerosis. MRA NECK: 1. No hemodynamically significant stenosis ICA on this motion degraded examination. 2. Moderate tandem stenosis bilateral vertebral arteries. Electronically Signed   By: Elon Alas M.D.   On: 07/24/2017 18:27   Mr Brain Wo Contrast  Result Date: 07/24/2017 CLINICAL DATA:  Weakness in RIGHT leg numbness since Jul 21, 2017. History of hypertension and diabetes. EXAM: MRI HEAD WITHOUT CONTRAST MRA HEAD WITHOUT CONTRAST MRA NECK WITHOUT AND WITH CONTRAST TECHNIQUE: Multiplanar, multiecho pulse sequences of the brain and surrounding structures were obtained without intravenous contrast. Angiographic images of the Circle of Willis were obtained using MRA technique without intravenous contrast. Angiographic images of the neck were obtained using MRA technique without and with intravenous contrast. Carotid stenosis measurements (when applicable) are obtained utilizing NASCET criteria, using the distal internal carotid diameter as the denominator. CONTRAST:  66mL MULTIHANCE GADOBENATE DIMEGLUMINE 529 MG/ML IV SOLN COMPARISON:  None. FINDINGS: MRI HEAD sequences vary from mild to moderately motion degraded. INTRACRANIAL CONTENTS: 5 mm reduced diffusion LEFT superior temporal gyrus with low ADC values. No susceptibility artifact to suggest hemorrhage. RIGHT inferior basal ganglia perivascular space. The ventricles and sulci are normal for patient's age. Faint supratentorial white matter FLAIR T2 hyperintensities compatible with mild chronic small vessel ischemic disease, less than expected for age. No suspicious parenchymal signal, masses, mass effect. No abnormal extra-axial fluid collections. No extra-axial masses. 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 SKULL AND UPPER CERVICAL SPINE: No abnormal sellar expansion. No suspicious calvarial bone marrow signal. Craniocervical junction maintained. SINUSES/ORBITS: Moderate paranasal sinusitis. Mastoid air cells are well aerated. Included ocular globes and orbital contents are non-suspicious. OTHER: None. MRA HEAD FINDINGS-moderately motion degraded examination. ANTERIOR CIRCULATION: Normal flow related  enhancement of the included cervical, petrous, cavernous and supraclinoid  internal carotid arteries. Patent anterior communicating artery. Azygos versus occluded LEFT ACA with moderate tandem stenosis. Patent bilateral middle cerebral arteries with moderate tandem stenosis. No large vessel occlusion, flow limiting stenosis. POSTERIOR CIRCULATION: Loss of LEFT V4 vertebral artery flow related enhancement. Basilar artery is patent, with normal flow related enhancement of the main branch vessels. Patent posterior cerebral arteries. No flow limiting stenosis. ANATOMIC VARIANTS: Hypoplastic LEFT A1 segment. Source images and MIP images were reviewed. MRA NECK FINDINGS-moderate motion degraded examination. ANTERIOR CIRCULATION: The common carotid arteries are widely patent bilaterally. The carotid bifurcations are patent bilaterally without hemodynamically significant stenosis by NASCET criteria. No flow limiting stenosis or luminal irregularity. POSTERIOR CIRCULATION: Bilateral vertebral arteries are patent to the vertebrobasilar junction, moderate tandem. No flow limiting stenosis or luminal irregularity. Source images and MIP images were reviewed. IMPRESSION: MRI HEAD: 1. Mildly motion degraded examination. Subcentimeter acute LEFT temporal lobe infarct. 2. Otherwise noncontrast MRI of the head for age. MRA HEAD: 1. Moderately motion degraded examination. Occluded LEFT vertebral artery. 2. 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 3. Moderate intracranial atherosclerosis. MRA NECK: 1. No hemodynamically significant stenosis ICA on this motion degraded examination. 2. Moderate tandem stenosis bilateral vertebral arteries. Electronically Signed   By: Elon Alas M.D.   On: 07/24/2017 18:27    Scheduled Meds: . aspirin  300 mg Rectal Daily   Or  . aspirin  325 mg Oral Daily  . atorvastatin  20 mg Oral Daily  . enoxaparin (LOVENOX) injection  40 mg Subcutaneous Q24H  . escitalopram  10 mg Oral Daily  . glipiZIDE  5  mg Oral QAC breakfast  . metFORMIN  1,000 mg Oral BID WC   Continuous Infusions:  Time spent: >25 minutes  Deatra James, MD [facility],  Pager [phone]  If 7PM-7AM, please contact night-coverage [email or web address]   Password [facility]  07/25/2017, 2:27 PM

## 2017-07-25 NOTE — Care Management Note (Signed)
Case Management Note  Patient Details  Name: Patrick Phillips MRN: [id number] Date of Birth: 01-14-1941  Subjective/Objective:      Pt admitted with CVA. He is from home with spouse.               Action/Plan: No f/u per PT/OT and no DME needs. CM following for physician orders.    Expected Discharge Date:                  Expected Discharge Plan:  Home/Self Care  In-House Referral:     Discharge planning Services     Post Acute Care Choice:    Choice offered to:     DME Arranged:    DME Agency:     HH Arranged:    HH Agency:     Status of Service:  In process, will continue to follow  If discussed at Long Length of Stay Meetings, dates discussed:    Additional Comments:  Pollie Friar, RN 07/25/2017, 1:02 PM

## 2017-07-25 NOTE — Plan of Care (Signed)
  Problem: Education: Goal: Knowledge of General Education information will improve Outcome: Progressing   Problem: Health Behavior/Discharge Planning: Goal: Ability to manage health-related needs will improve Outcome: Progressing   Problem: Clinical Measurements: Goal: Ability to maintain clinical measurements within normal limits will improve Outcome: Progressing Goal: Will remain free from infection Outcome: Progressing   Problem: Nutrition: Goal: Adequate nutrition will be maintained Outcome: Progressing   Problem: Education: Goal: Knowledge of disease or condition will improve Outcome: Progressing   Problem: Health Behavior/Discharge Planning: Goal: Ability to manage health-related needs will improve Outcome: Progressing   Problem: Nutrition: Goal: Risk of aspiration will decrease Outcome: Progressing   Problem: Education: Goal: Knowledge of disease or condition will improve Outcome: Progressing Goal: Knowledge of secondary prevention will improve Outcome: Progressing   Problem: Ischemic Stroke/TIA Tissue Perfusion: Goal: Complications of ischemic stroke/TIA will be minimized Outcome: Progressing

## 2017-07-25 NOTE — Evaluation (Signed)
Speech Language Pathology Evaluation Patient Details Name: Patrick Phillips MRN: [id number] DOB: Oct 16, 1940 Today's Date: 07/25/2017 Time: 6195-0932 SLP Time Calculation (min) (ACUTE ONLY): 17 min  Problem List:  Patient Active Problem List   Diagnosis Date Noted  . Acute ischemic stroke ([place]) 07/24/2017  . Chest pain 12/22/2015  . Chronic back pain 12/22/2015  . Syncope 10/17/2013  . Sinus bradycardia 10/17/2013  . SOB (shortness of breath) 10/17/2013  . Hypertension   . Diabetes mellitus without complication [facility])    Past Medical History:  Past Medical History:  Diagnosis Date  . Cancer ([place])    skin cancer  . Diabetes mellitus without complication ([place])   . Hypertension    Past Surgical History:  Past Surgical History:  Procedure Laterality Date  . BACK SURGERY    . HERNIA REPAIR    . TRIGGER FINGER RELEASE Right 12/02/2015   Procedure: RIGHT THUMB RELEASE TRIGGER FINGER/A-1 PULLEY;  Surgeon: Leanora Cover, MD;  Location: [facility];  Service: Orthopedics;  Laterality: Right;   HPI:  Patrick Phillips a 77 y.o.malewith medical history significant ofHTN, DM2. Patient initially seen 5/25 at [facility] with CP and R sided numbness and weakness. Denies any symptoms today. Patient stated that on Friday his right arm felt like it was going to fall off. He noticed the weakness first and then later it became numb. Called his daughter about 8 pm that night and they went to [employer]. At [facility] Second Troponin was 0.11. He was told at [facility] that he would be admitted and transferred to [facility]. Patient did not want to be admitted and left [place]. Today daughter called PCP, and his PCP told him to come back to the hospital so he came to [facility]. Patient denies also denies any SOB, vision changes Patient states that on Friday he has spent all day in 90+ degree weather mowing the lawn and tending to his yard. He also had to mow along an uneven ditch. Per patient this is the reason  for his right side being weak. MRI revealed ischemic left frontotempera.    Assessment / Plan / Recommendation Clinical Impression  Pt presents with baseline cognitive function. He demonstrates functional attention, recall of medical plan of care, able to demonstrate baseline problem solving and baseline anticipatory awareness. Daughter present and confirms that pt is at baseline and they are going to provide increased care at initial discharge. No further services indicated at this time, ST to sign off.      SLP Assessment  SLP Recommendation/Assessment: Patient does not need any further Speech Lanaguage Pathology Services SLP Visit Diagnosis: Cognitive communication deficit (R41.841);Frontal lobe and executive function deficit    Follow Up Recommendations  None    Frequency and Duration           SLP Evaluation Cognition  Overall Cognitive Status: Within Functional Limits for tasks assessed Arousal/Alertness: Awake/alert Orientation Level: Oriented X4 Attention: Selective Selective Attention: Appears intact Memory: Appears intact Awareness: Appears intact Problem Solving: Appears intact Safety/Judgment: Appears intact(appears at baseline per daughters)       Comprehension  Auditory Comprehension Overall Auditory Comprehension: Appears within functional limits for tasks assessed Visual Recognition/Discrimination Discrimination: Within Function Limits Reading Comprehension Reading Status: Not tested    Expression Expression Primary Mode of Expression: Verbal Verbal Expression Overall Verbal Expression: Appears within functional limits for tasks assessed Written Expression Dominant Hand: Right Written Expression: Not tested   Oral / Motor  Oral Motor/Sensory Function Overall Oral Motor/Sensory  Function: Within functional limits Motor Speech Overall Motor Speech: Appears within functional limits for tasks assessed Respiration: Within functional limits Phonation:  Normal Resonance: Within functional limits Articulation: Within functional limitis Intelligibility: Intelligible Motor Planning: Witnin functional limits Motor Speech Errors: Not applicable   GO                    Patrick Phillips 07/25/2017, 4:56 PM

## 2017-07-25 NOTE — Evaluation (Signed)
Occupational Therapy Evaluation Patient Details Name: Patrick Phillips MRN: [id number] DOB: 1940-10-15 Today's Date: 07/25/2017    History of Present Illness 77 y.o. male with medical history significant of HTN, DM2.  Patient initially seen 5/25 at [facility] with CP and R sided numbness and weakness.    Clinical Impression   Pt at Independent baseline level of function with ADLs and ADL mobility with slight balance impairments with higher level activities. Pt with Good UE/UB strength and no change in vision at this time. Pt able to step in and out of tub shower. All education completed and no further acute OT indicated at this time    Follow Up Recommendations  No OT follow up    Equipment Recommendations  None recommended by OT    Recommendations for Other Services       Precautions / Restrictions Precautions Precaution Comments: balance slightly impaired with higher level activity, no LOB Restrictions Weight Bearing Restrictions: No      Mobility Bed Mobility Overal bed mobility: Independent                Transfers Overall transfer level: Independent Equipment used: None                  Balance Overall balance assessment: Independent   Sitting balance-Leahy Scale: Normal Sitting balance - Comments: able to perform dynamic tasks without difficulty   Standing balance support: During functional activity Standing balance-Leahy Scale: Good               High level balance activites: Side stepping;Braiding;Backward walking;Direction changes;Turns;Sudden stops;Head turns High Level Balance Comments: no difficulties           ADL either performed or assessed with clinical judgement   ADL Overall ADL's : At baseline;Independent                                             Vision Patient Visual Report: No change from baseline       Perception     Praxis      Pertinent Vitals/Pain Pain Assessment: No/denies pain     Hand  Dominance Right   Extremity/Trunk Assessment Upper Extremity Assessment Upper Extremity Assessment: Overall WFL for tasks assessed   Lower Extremity Assessment Lower Extremity Assessment: Defer to PT evaluation   Cervical / Trunk Assessment Cervical / Trunk Assessment: Normal   Communication Communication Communication: No difficulties   Cognition Arousal/Alertness: Awake/alert Behavior During Therapy: WFL for tasks assessed/performed Overall Cognitive Status: Within Functional Limits for tasks assessed                                     General Comments       Exercises     Shoulder Instructions      Home Living Family/patient expects to be discharged to:: Private residence Living Arrangements: Spouse/significant other;Children Available Help at Discharge: Family Type of Home: Mobile home Home Access: Stairs to enter Technical brewer of Steps: 3   Home Layout: One level     Bathroom Shower/Tub: Teacher, early years/pre: Standard     Home Equipment: None          Prior Functioning/Environment Level of Independence: Independent  OT Problem List: Impaired balance (sitting and/or standing)      OT Treatment/Interventions:      OT Goals(Current goals can be found in the care plan section) Acute Rehab OT Goals Patient Stated Goal: go home OT Goal Formulation: With patient/family  OT Frequency:     Barriers to D/C:    no barriers       Co-evaluation              AM-PAC PT "6 Clicks" Daily Activity     Outcome Measure Help from another person eating meals?: None Help from another person taking care of personal grooming?: None Help from another person toileting, which includes using toliet, bedpan, or urinal?: None Help from another person bathing (including washing, rinsing, drying)?: None Help from another person to put on and taking off regular upper body clothing?: None Help from another  person to put on and taking off regular lower body clothing?: None 6 Click Score: 24   End of Session    Activity Tolerance: Patient tolerated treatment well Patient left: in bed;with call bell/phone within reach;with family/visitor present(sitting EOB)  OT Visit Diagnosis: Other abnormalities of gait and mobility (R26.89);Unsteadiness on feet (R26.81)                Time: 6283-1517 OT Time Calculation (min): 16 min Charges:  OT General Charges $OT Visit: 1 Visit OT Evaluation $OT Eval Low Complexity: 1 Low G-Codes: OT G-codes **NOT FOR INPATIENT CLASS** Functional Assessment Tool Used: AM-PAC 6 Clicks Daily Activity     [name] 07/25/2017, 10:44 AM

## 2017-07-25 NOTE — Progress Notes (Addendum)
STROKE TEAM PROGRESS NOTE  HPI:( Dr Rory Percy ) Patrick Phillips is a 77 y.o. male with PMH significant for HTN, DM, hyperlipidemia. Presents to Ed for right sided weakness and numbness that he had Friday 07/21/17.Patient intially presented Friday 5/25 to [facility] emergency room with chest pain and Right side numbness and weakness. Denies any numbness, tingling or feeling weak today. Patient stated that on Friday his right arm felt like it was going to fall off.  He noticed the weakness first and then later it became numb. Called his daughter about 8 pm that night and they went to [employer]. At [facility]  Second Troponin was 0.11. He was told at [facility] that he would be admitted and transferred to [facility]. Patient did not want to be admitted and left [place]. Today daughter called PCP, and his PCP told him to come back to the hospital so he came to [facility]. Patient denies also denies any SOB, vision changes  Patient states that on Friday he has spent all day in 90+ degree weather mowing the lawn and tending to his yard. He also had to mow along an uneven ditch. Per patient this is the reason for his right side being weak. Neurology was consulted at patient arrival to ED for right sided weakness and numbness Patient reports transient left-sided numbness and weakness with current return to normal baseline with no residual deficits.  Never had strokes in the past. LKW: 5/25 1600 tpa given?: no,contraindicated Premorbid modified rankin scale:1 NIH:0   INTERVAL HISTORY His family is at the bedside.  He is a daredevil at baseline, very active and not interested really in medicine or staying in the hospital. dtrs report he is very vain and would and would not like recommended loop placement. Discussed at length. Dr. Leonie Man to readdress with him.  Vitals:   07/24/17 2100 07/24/17 2338 07/25/17 0349 07/25/17 0808  BP: (!) 183/74 (!) 156/75 (!) 158/79 (!) 171/70  Pulse: (!) 52 (!) 49 (!) 48 (!) 51  Resp: 18 18 18  18   Temp: 97.8 F (36.6 C) 97.9 F (36.6 C) 98.2 F (36.8 C) 98.6 F (37 C)  TempSrc: Oral Oral Oral Oral  SpO2: 100% 97% 99% 97%  Weight:  101 kg (222 lb 10.6 oz)    Height:  6\' 1"  (1.854 m)      CBC:  Recent Labs  Lab 07/20/17 2227 07/20/17 2249 07/24/17 1032  WBC 10.1  --  8.3  NEUTROABS 6.4  --   --   HGB 13.3 12.9* 13.9  HCT 40.6 38.0* 42.9  MCV 87.9  --  86.5  PLT 214  --  267    Basic Metabolic Panel:  Recent Labs  Lab 07/20/17 2227 07/20/17 2249 07/24/17 1032  NA 139 143 140  K 4.1 4.0 3.9  CL 105 105 107  CO2 25  --  25  GLUCOSE 278* 272* 244*  BUN 23* 22* 14  CREATININE 1.26* 1.20 1.13  CALCIUM 9.4  --  9.2   Lipid Panel: No results found for: CHOL, TRIG, HDL, CHOLHDL, VLDL, LDLCALC HgbA1c:  Lab Results  Component Value Date   HGBA1C 8.0 (H) 07/25/2017   Urine Drug Screen: No results found for: LABOPIA, COCAINSCRNUR, LABBENZ, AMPHETMU, THCU, LABBARB  Alcohol Level     Component Value Date/Time   ETH <10 07/20/2017 2227    IMAGING Dg Chest 2 View  Result Date: 07/24/2017 CLINICAL DATA:  Weakness. Intermittent chest pain. Shortness of breath.  EXAM: CHEST - 2 VIEW COMPARISON:  07/20/2017. FINDINGS: Mediastinum and hilar structures normal. Lungs are clear. No pleural effusion or pneumothorax. Cardiomegaly with normal pulmonary vascularity. No acute bony abnormality. Degenerative changes thoracic spine with thoracic spine scoliosis. IMPRESSION: No acute cardiopulmonary disease. Electronically Signed   By: Marcello Moores  Register   On: 07/24/2017 11:53   Mr Angiogram Head Wo Contrast  Result Date: 07/24/2017 CLINICAL DATA:  Weakness in RIGHT leg numbness since Jul 21, 2017. History of hypertension and diabetes. EXAM: MRI HEAD WITHOUT CONTRAST MRA HEAD WITHOUT CONTRAST MRA NECK WITHOUT AND WITH CONTRAST TECHNIQUE: Multiplanar, multiecho pulse sequences of the brain and surrounding structures were obtained without intravenous contrast. Angiographic images of  the Circle of Willis were obtained using MRA technique without intravenous contrast. Angiographic images of the neck were obtained using MRA technique without and with intravenous contrast. Carotid stenosis measurements (when applicable) are obtained utilizing NASCET criteria, using the distal internal carotid diameter as the denominator. CONTRAST:  72mL MULTIHANCE GADOBENATE DIMEGLUMINE 529 MG/ML IV SOLN COMPARISON:  None. FINDINGS: MRI HEAD sequences vary from mild to moderately motion degraded. INTRACRANIAL CONTENTS: 5 mm reduced diffusion LEFT superior temporal gyrus with low ADC values. No susceptibility artifact to suggest hemorrhage. RIGHT inferior basal ganglia perivascular space. The ventricles and sulci are normal for patient's age. Faint supratentorial white matter FLAIR T2 hyperintensities compatible with mild chronic small vessel ischemic disease, less than expected for age. No suspicious parenchymal signal, masses, mass effect. No abnormal extra-axial fluid collections. No extra-axial masses. 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 SKULL AND UPPER CERVICAL SPINE: No abnormal sellar expansion. No suspicious calvarial bone marrow signal. Craniocervical junction maintained. SINUSES/ORBITS: Moderate paranasal sinusitis. Mastoid air cells are well aerated. Included ocular globes and orbital contents are non-suspicious. OTHER: None. MRA HEAD FINDINGS-moderately motion degraded examination. ANTERIOR CIRCULATION: Normal flow related enhancement of the included cervical, petrous, cavernous and supraclinoid internal carotid arteries. Patent anterior communicating artery. Azygos versus occluded LEFT ACA with moderate tandem stenosis. Patent bilateral middle cerebral arteries with moderate tandem stenosis. No large vessel occlusion, flow limiting stenosis. POSTERIOR CIRCULATION: Loss of LEFT V4 vertebral artery flow related enhancement. Basilar artery is patent, with normal flow  related enhancement of the main branch vessels. Patent posterior cerebral arteries. No flow limiting stenosis. ANATOMIC VARIANTS: Hypoplastic LEFT A1 segment. Source images and MIP images were reviewed. MRA NECK FINDINGS-moderate motion degraded examination. ANTERIOR CIRCULATION: The common carotid arteries are widely patent bilaterally. The carotid bifurcations are patent bilaterally without hemodynamically significant stenosis by NASCET criteria. No flow limiting stenosis or luminal irregularity. POSTERIOR CIRCULATION: Bilateral vertebral arteries are patent to the vertebrobasilar junction, moderate tandem. No flow limiting stenosis or luminal irregularity. Source images and MIP images were reviewed. IMPRESSION: MRI HEAD: 1. Mildly motion degraded examination. Subcentimeter acute LEFT temporal lobe infarct. 2. Otherwise noncontrast MRI of the head for age. MRA HEAD: 1. Moderately motion degraded examination. Occluded LEFT vertebral artery. 2. 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 3. Moderate intracranial atherosclerosis. MRA NECK: 1. No hemodynamically significant stenosis ICA on this motion degraded examination. 2. Moderate tandem stenosis bilateral vertebral arteries. Electronically Signed   By: Elon Alas M.D.   On: 07/24/2017 18:27   Mr Angiogram Neck W Or Wo Contrast  Result Date: 07/24/2017 CLINICAL DATA:  Weakness in RIGHT leg numbness since Jul 21, 2017. History of hypertension and diabetes. EXAM: MRI HEAD WITHOUT CONTRAST MRA HEAD WITHOUT CONTRAST MRA NECK WITHOUT AND WITH CONTRAST TECHNIQUE: Multiplanar, multiecho pulse sequences of the  brain and surrounding structures were obtained without intravenous contrast. Angiographic images of the Circle of Willis were obtained using MRA technique without intravenous contrast. Angiographic images of the neck were obtained using MRA technique without and with intravenous contrast. Carotid stenosis measurements (when applicable) are obtained utilizing NASCET  criteria, using the distal internal carotid diameter as the denominator. CONTRAST:  16mL MULTIHANCE GADOBENATE DIMEGLUMINE 529 MG/ML IV SOLN COMPARISON:  None. FINDINGS: MRI HEAD sequences vary from mild to moderately motion degraded. INTRACRANIAL CONTENTS: 5 mm reduced diffusion LEFT superior temporal gyrus with low ADC values. No susceptibility artifact to suggest hemorrhage. RIGHT inferior basal ganglia perivascular space. The ventricles and sulci are normal for patient's age. Faint supratentorial white matter FLAIR T2 hyperintensities compatible with mild chronic small vessel ischemic disease, less than expected for age. No suspicious parenchymal signal, masses, mass effect. No abnormal extra-axial fluid collections. No extra-axial masses. 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 SKULL AND UPPER CERVICAL SPINE: No abnormal sellar expansion. No suspicious calvarial bone marrow signal. Craniocervical junction maintained. SINUSES/ORBITS: Moderate paranasal sinusitis. Mastoid air cells are well aerated. Included ocular globes and orbital contents are non-suspicious. OTHER: None. MRA HEAD FINDINGS-moderately motion degraded examination. ANTERIOR CIRCULATION: Normal flow related enhancement of the included cervical, petrous, cavernous and supraclinoid internal carotid arteries. Patent anterior communicating artery. Azygos versus occluded LEFT ACA with moderate tandem stenosis. Patent bilateral middle cerebral arteries with moderate tandem stenosis. No large vessel occlusion, flow limiting stenosis. POSTERIOR CIRCULATION: Loss of LEFT V4 vertebral artery flow related enhancement. Basilar artery is patent, with normal flow related enhancement of the main branch vessels. Patent posterior cerebral arteries. No flow limiting stenosis. ANATOMIC VARIANTS: Hypoplastic LEFT A1 segment. Source images and MIP images were reviewed. MRA NECK FINDINGS-moderate motion degraded examination. ANTERIOR  CIRCULATION: The common carotid arteries are widely patent bilaterally. The carotid bifurcations are patent bilaterally without hemodynamically significant stenosis by NASCET criteria. No flow limiting stenosis or luminal irregularity. POSTERIOR CIRCULATION: Bilateral vertebral arteries are patent to the vertebrobasilar junction, moderate tandem. No flow limiting stenosis or luminal irregularity. Source images and MIP images were reviewed. IMPRESSION: MRI HEAD: 1. Mildly motion degraded examination. Subcentimeter acute LEFT temporal lobe infarct. 2. Otherwise noncontrast MRI of the head for age. MRA HEAD: 1. Moderately motion degraded examination. Occluded LEFT vertebral artery. 2. 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 3. Moderate intracranial atherosclerosis. MRA NECK: 1. No hemodynamically significant stenosis ICA on this motion degraded examination. 2. Moderate tandem stenosis bilateral vertebral arteries. Electronically Signed   By: Elon Alas M.D.   On: 07/24/2017 18:27   Mr Brain Wo Contrast  Result Date: 07/24/2017 CLINICAL DATA:  Weakness in RIGHT leg numbness since Jul 21, 2017. History of hypertension and diabetes. EXAM: MRI HEAD WITHOUT CONTRAST MRA HEAD WITHOUT CONTRAST MRA NECK WITHOUT AND WITH CONTRAST TECHNIQUE: Multiplanar, multiecho pulse sequences of the brain and surrounding structures were obtained without intravenous contrast. Angiographic images of the Circle of Willis were obtained using MRA technique without intravenous contrast. Angiographic images of the neck were obtained using MRA technique without and with intravenous contrast. Carotid stenosis measurements (when applicable) are obtained utilizing NASCET criteria, using the distal internal carotid diameter as the denominator. CONTRAST:  44mL MULTIHANCE GADOBENATE DIMEGLUMINE 529 MG/ML IV SOLN COMPARISON:  None. FINDINGS: MRI HEAD sequences vary from mild to moderately motion degraded. INTRACRANIAL CONTENTS: 5 mm reduced diffusion  LEFT superior temporal gyrus with low ADC values. No susceptibility artifact to suggest hemorrhage. RIGHT inferior basal ganglia perivascular space. The  ventricles and sulci are normal for patient's age. Faint supratentorial white matter FLAIR T2 hyperintensities compatible with mild chronic small vessel ischemic disease, less than expected for age. No suspicious parenchymal signal, masses, mass effect. No abnormal extra-axial fluid collections. No extra-axial masses. 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 SKULL AND UPPER CERVICAL SPINE: No abnormal sellar expansion. No suspicious calvarial bone marrow signal. Craniocervical junction maintained. SINUSES/ORBITS: Moderate paranasal sinusitis. Mastoid air cells are well aerated. Included ocular globes and orbital contents are non-suspicious. OTHER: None. MRA HEAD FINDINGS-moderately motion degraded examination. ANTERIOR CIRCULATION: Normal flow related enhancement of the included cervical, petrous, cavernous and supraclinoid internal carotid arteries. Patent anterior communicating artery. Azygos versus occluded LEFT ACA with moderate tandem stenosis. Patent bilateral middle cerebral arteries with moderate tandem stenosis. No large vessel occlusion, flow limiting stenosis. POSTERIOR CIRCULATION: Loss of LEFT V4 vertebral artery flow related enhancement. Basilar artery is patent, with normal flow related enhancement of the main branch vessels. Patent posterior cerebral arteries. No flow limiting stenosis. ANATOMIC VARIANTS: Hypoplastic LEFT A1 segment. Source images and MIP images were reviewed. MRA NECK FINDINGS-moderate motion degraded examination. ANTERIOR CIRCULATION: The common carotid arteries are widely patent bilaterally. The carotid bifurcations are patent bilaterally without hemodynamically significant stenosis by NASCET criteria. No flow limiting stenosis or luminal irregularity. POSTERIOR CIRCULATION: Bilateral vertebral  arteries are patent to the vertebrobasilar junction, moderate tandem. No flow limiting stenosis or luminal irregularity. Source images and MIP images were reviewed. IMPRESSION: MRI HEAD: 1. Mildly motion degraded examination. Subcentimeter acute LEFT temporal lobe infarct. 2. Otherwise noncontrast MRI of the head for age. MRA HEAD: 1. Moderately motion degraded examination. Occluded LEFT vertebral artery. 2. 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 3. Moderate intracranial atherosclerosis. MRA NECK: 1. No hemodynamically significant stenosis ICA on this motion degraded examination. 2. Moderate tandem stenosis bilateral vertebral arteries. Electronically Signed   By: Elon Alas M.D.   On: 07/24/2017 18:27    PHYSICAL EXAM Constitutional: Appears well-developed and well-nourished.  Psych: Affect appropriate to situation Eyes: No scleral injection HENT: No OP obstrucion Head: Normocephalic.  Cardiovascular: Normal rate and regular rhythm.  Respiratory: Effort normal, non-labored breathing Skin: WDI Neuro: Mental Status: Patient is awake, alert, oriented to person, place, month, and situation. He states he doesn't know the year normally only when he gets his check. Patient is able to give a clear and coherent history. No signs of aphasia or neglect Cranial Nerves: II: Visual Fields are full. Pupils are equal, round, and reactive to light.   III,IV, VI: EOMI without ptosis or diploplia.  V: Facial sensation is symmetric to light touch VII: Facial movement is symmetric.  VIII: hearing is intact to voice X: Uvula elevates symmetrically XI: Shoulder shrug is symmetric. XII: tongue is midline without atrophy or fasciculations.  Motor: Tone is normal. Bulk is normal. 5/5 strength was present in all four extremities Sensory: Sensation is symmetric to light touch in the arms and legs. Plantars: Toes are downgoing bilaterally.  Cerebellar: FNF and HKS are intact  bilaterally   ASSESSMENT/PLAN Mr. Khadir Roam is a 77 y.o. male with history of hypertension, diabetes, hyperlipidemia, alcohol use and sinus bradycardia presenting with right arm numbness that started on Friday, May 25.   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 Atrophy.   MRI motion degraded.  Subcentimeter left temporal lobe infarct  MRA head motion degraded.  Occluded left vertebral artery.  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  Moderate intracranial atherosclerosis  MRA neck motion degraded.  Unremarkable.  Moderate bilateral VA tandem stenosis  2D Echo  pending   TEE to look for embolic source. Arranged with Alice for tomorrow.  If positive for PFO (patent foramen ovale), check bilateral lower extremity venous dopplers to rule out DVT as possible source of stroke. (I have made patient NPO after midnight tonight).  If TEE negative, a [place] electrophysiologist will consult and consider placement of an implantable loop recorder to evaluate for atrial fibrillation as etiology of stroke. This has been explained to patient/family by Dr. Leonie Man and they are agreeable.   LDL 41  HgbA1c 8.0  Lovenox 40 mg sq daily for VTE prophylaxis Diet Order           Diet Heart Room service appropriate? Yes; Fluid consistency: Thin  Diet effective now           No antithrombotic prior to admission, now on aspirin 325 mg daily. Given mild stroke, will place on aspirin 81 mg and plavix 75 mg daily x 3 weeks, then aspirin alone. Orders adjusted.  Therapy recommendations: No therapy needs  Disposition: Return home  Hypertension  Stable . Permissive hypertension (OK if < 220/120) but gradually normalize in 5-7 days . Long-term BP goal normotensive  Hyperlipidemia  Home meds: Lipitor 20, resumed in hospital  LDL 41, goal < 70  Continue statin at discharge  Diabetes type  II  HgbA1c 8.0, goal < 7.0  Uncontrolled  Discussed he needs tighter control, he states he eats what he wants  Other Stroke Risk Factors  Advanced age  ETOH use, advised to drink no more than 2 drink(s) a day  Other Active Problems  Sinus bradycardia  Hospital day # [place], MSN, APRN, ANVP-BC, AGPCNP-BC Advanced Practice Stroke Nurse [place] for Schedule & Pager information 07/25/2017 2:39 PM  I have personally examined this patient, reviewed notes, independently viewed imaging studies, participated in medical decision making and plan of care.ROS completed by me personally and pertinent positives fully documented  I have made any additions or clarifications directly to the above note. Agree with note above.  He presented with transient right upper extremity weakness and numbness due to the small embolic left temporal infarct of cryptogenic etiology. I had a long discussion with the patient with the need for continuing ongoing workup including TEE and loop recorder to look for paroxysmal A. Fib and cardiac source of embolism. After great discussion and debate patient is willing to stay and get the workup done. I counseled him to be compliant with his medications and medical follow-up as well. Recommend dual antiplatelet therapy for 3 weeks followed by aspirin alone. Discussed with patient and his 2 daughters at the bedside. Greater than 50% time during this 35 minute visit was spent on counseling and coordination of care about his embolic stroke and discussion about stroke workup and answered questions.  Antony Contras, MD Medical Director [facility] Pager: [phone] 07/25/2017 3:15 PM  To contact Stroke Continuity provider, please refer to [email or web address] After hours, contact General Neurology

## 2017-07-25 NOTE — Progress Notes (Signed)
  Echocardiogram 2D Echocardiogram has been performed.  Patrick Phillips Patrick Phillips 07/25/2017, 11:48 AM

## 2017-07-25 NOTE — Progress Notes (Signed)
    [facility] has been requested to perform a transesophageal echocardiogram on Patrick Phillips for stroke.  After careful review of history and examination, the risks and benefits of transesophageal echocardiogram have been explained including risks of esophageal damage, perforation (1:10,000 risk), bleeding, pharyngeal hematoma as well as other potential complications associated with conscious sedation including aspiration, arrhythmia, respiratory failure and death. Alternatives to treatment were discussed, questions were answered. Patient is willing to proceed.  TEE - Dr. Harrington Challenger 07/26/17 @ 1500. NPO after midnight. Meds with sips.   Leanor Kail, PA-C 07/25/2017 5:04 PM

## 2017-07-25 NOTE — Progress Notes (Addendum)
Inpatient Diabetes Program Recommendations  AACE/ADA: New Consensus Statement on Inpatient Glycemic Control (2015)  Target Ranges:  Prepandial:   less than 140 mg/dL      Peak postprandial:   less than 180 mg/dL (1-2 hours)      Critically ill patients:  140 - 180 mg/dL   Lab Results  Component Value Date   GLUCAP 136 (H) 07/25/2017   HGBA1C 8.0 (H) 07/25/2017    Review of Glycemic Control Results for ROSHON, DUELL (MRN [id number]) as of 07/25/2017 12:20  Ref. Range 07/25/2017 06:03 07/25/2017 11:54  Glucose-Capillary Latest Ref Range: 65 - 99 mg/dL 197 (H) 136 (H)   Diabetes history: Type 2 Outpatient Diabetes medications: Glucotrol 5 mg daily, Metformin 1000 mg bid Current orders for Inpatient glycemic control:  Glucotrol 5 mg q AM, and Metformin 1000 mg bid  Inpatient Diabetes Program Recommendations:    Please add Novolog sensitive correction tid with meals while in the hospital. Patient will also need f/u with PCP regarding A1C and diabetes.  Thanks,  Adah Perl, RN, BC-ADM Inpatient Diabetes Coordinator Pager [phone] (8a-5p)

## 2017-07-25 NOTE — Evaluation (Signed)
Physical Therapy Evaluation Patient Details Name: Patrick Phillips MRN: [id number] DOB: 1940/11/29 Today's Date: 07/25/2017   History of Present Illness  77 y.o. male with medical history significant of HTN, DM2.  Patient initially seen 5/25 at [facility] with CP and R sided numbness and weakness.   Clinical Impression  Orders received for PT evaluation. Patient demonstrates modest deficits in functional mobility as indicated below, but reports that this is baseline. No overt LOB and no physical assist required.  Anticipate patient will be safe for d/c home from mobility stand point. No further acute PT needs at this time. Will sign off.     Follow Up Recommendations No PT follow up    Equipment Recommendations  None recommended by PT    Recommendations for Other Services       Precautions / Restrictions Precautions Precaution Comments: balance slightly impaired with higher level activity, no LOB Restrictions Weight Bearing Restrictions: No      Mobility  Bed Mobility Overal bed mobility: Independent                Transfers Overall transfer level: Independent Equipment used: None                Ambulation/Gait Ambulation/Gait assistance: Independent Ambulation Distance (Feet): 310 Feet Assistive device: None Gait Pattern/deviations: WFL(Within Functional Limits)     General Gait Details: steady with ambulation  Stairs            Wheelchair Mobility    Modified Rankin (Stroke Patients Only) Modified Rankin (Stroke Patients Only) Pre-Morbid Rankin Score: No symptoms Modified Rankin: No symptoms     Balance Overall balance assessment: Independent   Sitting balance-Leahy Scale: Normal Sitting balance - Comments: able to perform dynamic tasks without difficulty   Standing balance support: During functional activity Standing balance-Leahy Scale: Good               High level balance activites: Side stepping;Braiding;Backward walking;Direction  changes;Turns;Sudden stops;Head turns High Level Balance Comments: no difficulties             Pertinent Vitals/Pain Pain Assessment: No/denies pain    Home Living Family/patient expects to be discharged to:: Private residence Living Arrangements: Spouse/significant other;Children Available Help at Discharge: Family Type of Home: Mobile home Home Access: Stairs to enter   Technical brewer of Steps: 3 Home Layout: One level Home Equipment: None      Prior Function Level of Independence: Independent               Hand Dominance   Dominant Hand: Right    Extremity/Trunk Assessment   Upper Extremity Assessment Upper Extremity Assessment: Overall WFL for tasks assessed    Lower Extremity Assessment Lower Extremity Assessment: Defer to PT evaluation    Cervical / Trunk Assessment Cervical / Trunk Assessment: Normal  Communication   Communication: No difficulties  Cognition Arousal/Alertness: Awake/alert Behavior During Therapy: WFL for tasks assessed/performed Overall Cognitive Status: Within Functional Limits for tasks assessed                                        General Comments      Exercises     Assessment/Plan    PT Assessment Patent does not need any further PT services  PT Problem List         PT Treatment Interventions      PT Goals (Current goals can be  found in the Care Plan section)  Acute Rehab PT Goals Patient Stated Goal: go home PT Goal Formulation: All assessment and education complete, DC therapy    Frequency     Barriers to discharge        Co-evaluation               AM-PAC PT "6 Clicks" Daily Activity  Outcome Measure Difficulty turning over in bed (including adjusting bedclothes, sheets and blankets)?: None Difficulty moving from lying on back to sitting on the side of the bed? : None Difficulty sitting down on and standing up from a chair with arms (e.g., wheelchair, bedside commode,  etc,.)?: None Help needed moving to and from a bed to chair (including a wheelchair)?: None Help needed walking in hospital room?: None Help needed climbing 3-5 steps with a railing? : A Little 6 Click Score: 23    End of Session   Activity Tolerance: Patient tolerated treatment well Patient left: in bed;with call bell/phone within reach;with family/visitor present Nurse Communication: Mobility status PT Visit Diagnosis: Other symptoms and signs involving the nervous system (R29.898)    Time: 1740-8144 PT Time Calculation (min) (ACUTE ONLY): 17 min   Charges:   PT Evaluation $PT Eval Low Complexity: 1 Low     PT G Codes:        Alben Deeds, PT DPT  Board Certified Neurologic Specialist [place] 07/25/2017, 10:28 AM

## 2017-07-26 ENCOUNTER — Encounter (HOSPITAL_COMMUNITY)
Admission: EM | Disposition: A | Discharge status: Home / Self Care | Payer: Self-pay | Source: Home / Self Care | Attending: Family Medicine

## 2017-07-26 ENCOUNTER — Inpatient Hospital Stay (HOSPITAL_COMMUNITY)
Admit: 2017-07-26 | Discharge: 2017-07-26 | Disposition: A | Discharge status: Home / Self Care | Payer: Medicare Other | Attending: Physician Assistant | Admitting: Physician Assistant

## 2017-07-26 ENCOUNTER — Encounter (HOSPITAL_COMMUNITY): Payer: Self-pay

## 2017-07-26 DIAGNOSIS — I6389 Other cerebral infarction: Secondary | ICD-10-CM

## 2017-07-26 HISTORY — PX: LOOP RECORDER INSERTION: EP1214

## 2017-07-26 HISTORY — PX: TEE WITHOUT CARDIOVERSION: SHX5443

## 2017-07-26 LAB — GLUCOSE, CAPILLARY
GLUCOSE-CAPILLARY: 125 mg/dL — AB (ref 65–99)
Glucose-Capillary: 176 mg/dL — ABNORMAL HIGH (ref 65–99)
Glucose-Capillary: 195 mg/dL — ABNORMAL HIGH (ref 65–99)

## 2017-07-26 SURGERY — LOOP RECORDER INSERTION
Anesthesia: LOCAL

## 2017-07-26 SURGERY — ECHOCARDIOGRAM, TRANSESOPHAGEAL
Anesthesia: Moderate Sedation

## 2017-07-26 MED ORDER — ASPIRIN 81 MG PO TBEC: 81.0000 mg | DELAYED_RELEASE_TABLET | Freq: Every day | ORAL | 3 refills | Status: AC

## 2017-07-26 MED ORDER — SODIUM CHLORIDE 0.9 % IV SOLN
INTRAVENOUS | Status: DC
  Administered 2017-07-26: 10:00:00 via INTRAVENOUS

## 2017-07-26 MED ORDER — SODIUM CHLORIDE BACTERIOSTATIC 0.9 % IJ SOLN
INTRAMUSCULAR | Status: DC | PRN
  Administered 2017-07-26: 9 mL

## 2017-07-26 MED ORDER — LIDOCAINE VISCOUS HCL 2 % MT SOLN
OROMUCOSAL | Status: AC
  Filled 2017-07-26: qty 15

## 2017-07-26 MED ORDER — MIDAZOLAM HCL 10 MG/2ML IJ SOLN
INTRAMUSCULAR | Status: DC | PRN
  Administered 2017-07-26 (×3): 2 mg via INTRAVENOUS

## 2017-07-26 MED ORDER — FENTANYL CITRATE (PF) 100 MCG/2ML IJ SOLN
INTRAMUSCULAR | Status: AC
  Filled 2017-07-26: qty 2

## 2017-07-26 MED ORDER — LIDOCAINE VISCOUS HCL 2 % MT SOLN
OROMUCOSAL | Status: DC | PRN
  Administered 2017-07-26: 5 mL via OROMUCOSAL

## 2017-07-26 MED ORDER — LIDOCAINE-EPINEPHRINE 1 %-1:100000 IJ SOLN
INTRAMUSCULAR | Status: DC | PRN
  Administered 2017-07-26: 20 mL

## 2017-07-26 MED ORDER — MIDAZOLAM HCL 5 MG/ML IJ SOLN
INTRAMUSCULAR | Status: AC
  Filled 2017-07-26: qty 2

## 2017-07-26 MED ORDER — CLOPIDOGREL BISULFATE 75 MG PO TABS: 75.0000 mg | ORAL_TABLET | Freq: Every day | ORAL | 0 refills | Status: AC

## 2017-07-26 MED ORDER — FENTANYL CITRATE (PF) 100 MCG/2ML IJ SOLN
INTRAMUSCULAR | Status: DC | PRN
  Administered 2017-07-26 (×3): 25 ug via INTRAVENOUS

## 2017-07-26 MED ORDER — ATORVASTATIN CALCIUM 20 MG PO TABS: 40.0000 mg | ORAL_TABLET | Freq: Every day | ORAL | 0 refills | Status: DC

## 2017-07-26 MED ORDER — LIDOCAINE-EPINEPHRINE 1 %-1:100000 IJ SOLN
INTRAMUSCULAR | Status: AC
  Filled 2017-07-26: qty 1

## 2017-07-26 SURGICAL SUPPLY — 2 items
LOOP REVEAL LINQSYS (Prosthesis & Implant Heart) ×2 IMPLANT
PACK LOOP INSERTION (CUSTOM PROCEDURE TRAY) ×2 IMPLANT

## 2017-07-26 NOTE — CV Procedure (Signed)
  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   No AI PV normal    LVEF and RVEF normal    Normal thoracic aorta  No PFO by color doppler or with injection of agitated saline   Procedure without complication.  Patrick Phillips

## 2017-07-26 NOTE — Care Management Note (Signed)
Case Management Note  Patient Details  Name: Ramari Bray MRN: [id number] Date of Birth: Jun 02, 1940  Subjective/Objective:                    Action/Plan: Pt discharging home with self care. No f/u per PT/OT and no DME needs. Pt has PCP: [employer], insurance and transportation home.    Expected Discharge Date:  07/26/17               Expected Discharge Plan:  Home/Self Care  In-House Referral:     Discharge planning Services     Post Acute Care Choice:    Choice offered to:     DME Arranged:    DME Agency:     HH Arranged:    HH Agency:     Status of Service:  Completed, signed off  If discussed at [employer] of Stay Meetings, dates discussed:    Additional Comments:  Pollie Friar, RN 07/26/2017, 3:00 PM

## 2017-07-26 NOTE — Discharge Instructions (Signed)
Post implant care instructions °Keep incision clean and dry for 3 days. °You can remove outer dressing tomorrow. °Leave steri-strips (little pieces of tape) on until seen in the office for wound check appointment. °Call the office ([phone]) for redness, drainage, swelling, or fever. ° °

## 2017-07-26 NOTE — Interval H&P Note (Signed)
History and Physical Interval Note:  07/26/2017 10:14 AM  Patrick Phillips  has presented today for surgery, with the diagnosis of stroke  The various methods of treatment have been discussed with the patient and family. After consideration of risks, benefits and other options for treatment, the patient has consented to  Procedure(s): TRANSESOPHAGEAL ECHOCARDIOGRAM (TEE) (N/A) as a surgical intervention .  The patient's history has been reviewed, patient examined, no change in status, stable for surgery.  I have reviewed the patient's chart and labs.  Questions were answered to the patient's satisfaction.     [name]

## 2017-07-26 NOTE — H&P (Signed)
Physician Discharge Summary [facility]    Patient: Patrick Phillips                   Admit date: 07/24/2017   DOB: October 28, 1940             Discharge date:07/26/2017/2:41 PM KVQ:[id number]                           PCP: System, Pcp Not In  Recommendations for Outpatient Follow-up:   1.  Please follow-up with your primary care physician within 1-2 weeks. 2.  Please follow-up with a cardiologist regarding the loop recorder evaluation  Discharge Condition: Stable  CODE STATUS:  Full code   Diet recommendation:  Cardiac/diabetic diet  ----------------------------------------------------------------------------------------------------------------------  Discharge Diagnoses:   Principal Problem:   Acute ischemic stroke ([place]) Active Problems:   Hypertension   Diabetes mellitus without complication ([place])   Sinus bradycardia   History of present illness : Patrick Phillips a 77 y.o.malewith medical history significant ofHTN, DM2. Patient initially seen 5/25 at [facility] with CP and R sided numbness and weakness. Denies any symptoms today. Patient stated that on Friday his right arm felt like it was going to fall off. He noticed the weakness first and then later it became numb. Called his daughter about 8 pm that night and they went to [employer]. At [facility] Troponin was 0.11. He was told at [facility] that he would be admitted and transferred to [facility]. Patient did not want to be admitted and left [place]. Today daughter called PCP, and his PCP told him to come back to the hospital so he came to [facility]. Patient denies also denies any SOB, vision changes Patient states that on Friday he has spent all day in 90+ degree weather mowing the lawn and tending to his yard. He also had to mow along an uneven ditch. Per patient this is the reason for his right side being weak.  MRI brain confirms punctate acute ischemic stroke L temporal lobe. MRA head and neck shows moderate  atherosclerosis. No hemodynamically significant ICA stenosis bilaterally. 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 L vertebral art occlusion MRA brain. 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  Hospital course / Brief Summary: Subsequently patient was admitted, neurology was consulted, patient is complete acute stroke work-up On the causes a contributing factor to his acute CVA was identified cardiology recommended TEE which was within normal limits, no PFO, we have increased his Lipitor from 20 to 40 mg daily, per cardiology recommendation added aspirin and Plavix to be continued for total of 3 weeks then aspirin daily.  For detailed discharge summary please see below:   Stroke:   Left temporal lobe embolic infarct secondary to unknown source  CT head No acute stroke. Atrophy.   MRI motion degraded.  Subcentimeter left temporal lobe infarct  MRA head motion degraded.  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  Moderate intracranial atherosclerosis  MRA neck motion degraded.  Unremarkable.  Moderate bilateral VA tandem stenosis  2D Echo  EF 60-65%. No source of embolus   TEE no SOE, no PFO  Post loop recorder placement on 07/26/2017 by Dr. Lovena Le   Hyperlipidemia -Goal of LDL 41 -continue statins  Diabetes mellitus type 2 HgbA1c 8.0 -strict diabetic diet, current medication will be titrated by PCP for A1c goal of 6.5 Continue home medications of glipizide and metformin  Sinus bradycardia -Improved heart rate  Consultations:  Neurologist/cardiologist  Procedures:  TEE/loop recorder placement  ----------------------------------------------------------------------------------------------------------------------  Discharge Instructions:   Discharge Instructions    Activity as tolerated - No restrictions   Complete by:  As directed    Ambulatory referral to Neurology   Complete by:  As directed    Follow up with stroke clinic NP (Jessica  Vanschaick or Cecille Rubin, if both not available, consider Dr. Antony Contras, Dr. Bess Harvest, or Dr. Sarina Ill) at [facility] in about 4 weeks.   Diet - low sodium heart healthy   Complete by:  As directed    Discharge instructions   Complete by:  As directed    Please follow-up with your cardiologist regarding your loop recorder to rule out any atrial fibrillation. Continue aspirin and Plavix for total of 3 weeks then continue with only aspirin per neurology recommendations Please follow-up with your primary care physician and neurologist in 2 to 3 weeks.   Increase activity slowly   Complete by:  As directed        Medication List    TAKE these medications   amLODipine 5 MG tablet Commonly known as:  NORVASC Take 5 mg by mouth 2 (two) times daily.   aspirin 81 MG EC tablet Take 1 tablet (81 mg total) by mouth daily. Start taking on:  07/27/2017   atorvastatin 20 MG tablet Commonly known as:  LIPITOR Take 2 tablets (40 mg total) by mouth daily. What changed:  how much to take   clopidogrel 75 MG tablet Commonly known as:  PLAVIX Take 1 tablet (75 mg total) by mouth daily for 21 days. Start taking on:  07/27/2017   escitalopram 10 MG tablet Commonly known as:  LEXAPRO Take 10 mg by mouth daily.   glipiZIDE 5 MG tablet Commonly known as:  GLUCOTROL Take 5 mg by mouth daily.   metFORMIN 500 MG tablet Commonly known as:  GLUCOPHAGE Take 1,000 mg by mouth 2 (two) times daily.      Follow-up Information    Schedule an appointment as soon as possible for a visit  with Awanda Mink, Algernon Huxley, PA-C.   Contact information: [place] [address] [phone]        [facility] Follow up in 4 week(s).   Specialty:  Neurology Why:  Stroke clinic.  Office will call with appointment date and time. Contact information: [address] [place] [phone]         No Known  Allergies    Procedures/Studies: Dg Chest 2 View  Result Date: 07/24/2017 CLINICAL DATA:  Weakness. Intermittent chest pain. Shortness of breath. EXAM: CHEST - 2 VIEW COMPARISON:  07/20/2017. FINDINGS: Mediastinum and hilar structures normal. Lungs are clear. No pleural effusion or pneumothorax. Cardiomegaly with normal pulmonary vascularity. No acute bony abnormality. Degenerative changes thoracic spine with thoracic spine scoliosis. IMPRESSION: No acute cardiopulmonary disease. Electronically Signed   By: Marcello Moores  Register   On: 07/24/2017 11:53   Dg Chest 2 View  Result Date: 07/20/2017 CLINICAL DATA:  Shortness of breath, onset tonight. Right foot numbness and right hand pain starting 2 days ago. Pain now radiates to the shoulder. History of diabetes and hypertension. EXAM: CHEST - 2 VIEW COMPARISON:  12/22/2015 FINDINGS: Heart size and pulmonary vascularity are normal. No airspace disease or consolidation in the lungs. No blunting of costophrenic angles. No pneumothorax. Mediastinal contours appear intact. Calcification of the aorta. Degenerative changes in the spine. IMPRESSION: No evidence of  active pulmonary disease.  Aortic atherosclerosis. Electronically Signed   By: Lucienne Capers M.D.   On: 07/20/2017 23:24   Ct Head Wo Contrast  Result Date: 07/20/2017 CLINICAL DATA:  Right foot numbness and pain in the right hand EXAM: CT HEAD WITHOUT CONTRAST TECHNIQUE: Contiguous axial images were obtained from the base of the skull through the vertex without intravenous contrast. COMPARISON:  02/23/2017 FINDINGS: Brain: No acute territorial infarction, hemorrhage or intracranial mass. 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 Moderate atrophy. Stable ventricle size. Vascular: No hyperdense vessels. Carotid vascular calcification. Vertebral artery calcification. Skull: Normal. Negative for fracture or focal lesion. Sinuses/Orbits: Moderate mucosal thickening in the ethmoids sinuses with mild  thickening in the frontal and maxillary sinuses. Other: None. IMPRESSION: 1. No CT evidence for acute intracranial abnormality. 2. Atrophy Electronically Signed   By: Donavan Foil M.D.   On: 07/20/2017 23:37   Mr Angiogram Head Wo Contrast  Result Date: 07/24/2017 CLINICAL DATA:  Weakness in RIGHT leg numbness since Jul 21, 2017. History of hypertension and diabetes. EXAM: MRI HEAD WITHOUT CONTRAST MRA HEAD WITHOUT CONTRAST MRA NECK WITHOUT AND WITH CONTRAST TECHNIQUE: Multiplanar, multiecho pulse sequences of the brain and surrounding structures were obtained without intravenous contrast. Angiographic images of the Circle of Willis were obtained using MRA technique without intravenous contrast. Angiographic images of the neck were obtained using MRA technique without and with intravenous contrast. Carotid stenosis measurements (when applicable) are obtained utilizing NASCET criteria, using the distal internal carotid diameter as the denominator. CONTRAST:  56mL MULTIHANCE GADOBENATE DIMEGLUMINE 529 MG/ML IV SOLN COMPARISON:  None. FINDINGS: MRI HEAD sequences vary from mild to moderately motion degraded. INTRACRANIAL CONTENTS: 5 mm reduced diffusion LEFT superior temporal gyrus with low ADC values. No susceptibility artifact to suggest hemorrhage. RIGHT inferior basal ganglia perivascular space. The ventricles and sulci are normal for patient's age. Faint supratentorial white matter FLAIR T2 hyperintensities compatible with mild chronic small vessel ischemic disease, less than expected for age. No suspicious parenchymal signal, masses, mass effect. No abnormal extra-axial fluid collections. No extra-axial masses. 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 SKULL AND UPPER CERVICAL SPINE: No abnormal sellar expansion. No suspicious calvarial bone marrow signal. Craniocervical junction maintained. SINUSES/ORBITS: Moderate paranasal sinusitis. Mastoid air cells are well aerated.  Included ocular globes and orbital contents are non-suspicious. OTHER: None. MRA HEAD FINDINGS-moderately motion degraded examination. ANTERIOR CIRCULATION: Normal flow related enhancement of the included cervical, petrous, cavernous and supraclinoid internal carotid arteries. Patent anterior communicating artery. Azygos versus occluded LEFT ACA with moderate tandem stenosis. Patent bilateral middle cerebral arteries with moderate tandem stenosis. No large vessel occlusion, flow limiting stenosis. POSTERIOR CIRCULATION: Loss of LEFT V4 vertebral artery flow related enhancement. Basilar artery is patent, with normal flow related enhancement of the main branch vessels. Patent posterior cerebral arteries. No flow limiting stenosis. ANATOMIC VARIANTS: Hypoplastic LEFT A1 segment. Source images and MIP images were reviewed. MRA NECK FINDINGS-moderate motion degraded examination. ANTERIOR CIRCULATION: The common carotid arteries are widely patent bilaterally. The carotid bifurcations are patent bilaterally without hemodynamically significant stenosis by NASCET criteria. No flow limiting stenosis or luminal irregularity. POSTERIOR CIRCULATION: Bilateral vertebral arteries are patent to the vertebrobasilar junction, moderate tandem. No flow limiting stenosis or luminal irregularity. Source images and MIP images were reviewed. IMPRESSION: MRI HEAD: 1. Mildly motion degraded examination. Subcentimeter acute LEFT temporal lobe infarct. 2. Otherwise noncontrast MRI of the head for age. MRA HEAD: 1. Moderately motion degraded examination. Occluded LEFT vertebral artery.  2. 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 3. Moderate intracranial atherosclerosis. MRA NECK: 1. No hemodynamically significant stenosis ICA on this motion degraded examination. 2. Moderate tandem stenosis bilateral vertebral arteries. Electronically Signed   By: Elon Alas M.D.   On: 07/24/2017 18:27   Mr Angiogram Neck W Or Wo Contrast  Result Date:  07/24/2017 CLINICAL DATA:  Weakness in RIGHT leg numbness since Jul 21, 2017. History of hypertension and diabetes. EXAM: MRI HEAD WITHOUT CONTRAST MRA HEAD WITHOUT CONTRAST MRA NECK WITHOUT AND WITH CONTRAST TECHNIQUE: Multiplanar, multiecho pulse sequences of the brain and surrounding structures were obtained without intravenous contrast. Angiographic images of the Circle of Willis were obtained using MRA technique without intravenous contrast. Angiographic images of the neck were obtained using MRA technique without and with intravenous contrast. Carotid stenosis measurements (when applicable) are obtained utilizing NASCET criteria, using the distal internal carotid diameter as the denominator. CONTRAST:  34mL MULTIHANCE GADOBENATE DIMEGLUMINE 529 MG/ML IV SOLN COMPARISON:  None. FINDINGS: MRI HEAD sequences vary from mild to moderately motion degraded. INTRACRANIAL CONTENTS: 5 mm reduced diffusion LEFT superior temporal gyrus with low ADC values. No susceptibility artifact to suggest hemorrhage. RIGHT inferior basal ganglia perivascular space. The ventricles and sulci are normal for patient's age. Faint supratentorial white matter FLAIR T2 hyperintensities compatible with mild chronic small vessel ischemic disease, less than expected for age. No suspicious parenchymal signal, masses, mass effect. No abnormal extra-axial fluid collections. No extra-axial masses. 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 SKULL AND UPPER CERVICAL SPINE: No abnormal sellar expansion. No suspicious calvarial bone marrow signal. Craniocervical junction maintained. SINUSES/ORBITS: Moderate paranasal sinusitis. Mastoid air cells are well aerated. Included ocular globes and orbital contents are non-suspicious. OTHER: None. MRA HEAD FINDINGS-moderately motion degraded examination. ANTERIOR CIRCULATION: Normal flow related enhancement of the included cervical, petrous, cavernous and supraclinoid internal  carotid arteries. Patent anterior communicating artery. Azygos versus occluded LEFT ACA with moderate tandem stenosis. Patent bilateral middle cerebral arteries with moderate tandem stenosis. No large vessel occlusion, flow limiting stenosis. POSTERIOR CIRCULATION: Loss of LEFT V4 vertebral artery flow related enhancement. Basilar artery is patent, with normal flow related enhancement of the main branch vessels. Patent posterior cerebral arteries. No flow limiting stenosis. ANATOMIC VARIANTS: Hypoplastic LEFT A1 segment. Source images and MIP images were reviewed. MRA NECK FINDINGS-moderate motion degraded examination. ANTERIOR CIRCULATION: The common carotid arteries are widely patent bilaterally. The carotid bifurcations are patent bilaterally without hemodynamically significant stenosis by NASCET criteria. No flow limiting stenosis or luminal irregularity. POSTERIOR CIRCULATION: Bilateral vertebral arteries are patent to the vertebrobasilar junction, moderate tandem. No flow limiting stenosis or luminal irregularity. Source images and MIP images were reviewed. IMPRESSION: MRI HEAD: 1. Mildly motion degraded examination. Subcentimeter acute LEFT temporal lobe infarct. 2. Otherwise noncontrast MRI of the head for age. MRA HEAD: 1. Moderately motion degraded examination. Occluded LEFT vertebral artery. 2. 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 3. Moderate intracranial atherosclerosis. MRA NECK: 1. No hemodynamically significant stenosis ICA on this motion degraded examination. 2. Moderate tandem stenosis bilateral vertebral arteries. Electronically Signed   By: Elon Alas M.D.   On: 07/24/2017 18:27   Mr Brain Wo Contrast  Result Date: 07/24/2017 CLINICAL DATA:  Weakness in RIGHT leg numbness since Jul 21, 2017. History of hypertension and diabetes. EXAM: MRI HEAD WITHOUT CONTRAST MRA HEAD WITHOUT CONTRAST MRA NECK WITHOUT AND WITH CONTRAST TECHNIQUE: Multiplanar, multiecho pulse sequences of the brain and  surrounding structures were obtained without intravenous contrast. Angiographic images of the  Circle of Willis were obtained using MRA technique without intravenous contrast. Angiographic images of the neck were obtained using MRA technique without and with intravenous contrast. Carotid stenosis measurements (when applicable) are obtained utilizing NASCET criteria, using the distal internal carotid diameter as the denominator. CONTRAST:  50mL MULTIHANCE GADOBENATE DIMEGLUMINE 529 MG/ML IV SOLN COMPARISON:  None. FINDINGS: MRI HEAD sequences vary from mild to moderately motion degraded. INTRACRANIAL CONTENTS: 5 mm reduced diffusion LEFT superior temporal gyrus with low ADC values. No susceptibility artifact to suggest hemorrhage. RIGHT inferior basal ganglia perivascular space. The ventricles and sulci are normal for patient's age. Faint supratentorial white matter FLAIR T2 hyperintensities compatible with mild chronic small vessel ischemic disease, less than expected for age. No suspicious parenchymal signal, masses, mass effect. No abnormal extra-axial fluid collections. No extra-axial masses. 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 SKULL AND UPPER CERVICAL SPINE: No abnormal sellar expansion. No suspicious calvarial bone marrow signal. Craniocervical junction maintained. SINUSES/ORBITS: Moderate paranasal sinusitis. Mastoid air cells are well aerated. Included ocular globes and orbital contents are non-suspicious. OTHER: None. MRA HEAD FINDINGS-moderately motion degraded examination. ANTERIOR CIRCULATION: Normal flow related enhancement of the included cervical, petrous, cavernous and supraclinoid internal carotid arteries. Patent anterior communicating artery. Azygos versus occluded LEFT ACA with moderate tandem stenosis. Patent bilateral middle cerebral arteries with moderate tandem stenosis. No large vessel occlusion, flow limiting stenosis. POSTERIOR CIRCULATION: Loss of  LEFT V4 vertebral artery flow related enhancement. Basilar artery is patent, with normal flow related enhancement of the main branch vessels. Patent posterior cerebral arteries. No flow limiting stenosis. ANATOMIC VARIANTS: Hypoplastic LEFT A1 segment. Source images and MIP images were reviewed. MRA NECK FINDINGS-moderate motion degraded examination. ANTERIOR CIRCULATION: The common carotid arteries are widely patent bilaterally. The carotid bifurcations are patent bilaterally without hemodynamically significant stenosis by NASCET criteria. No flow limiting stenosis or luminal irregularity. POSTERIOR CIRCULATION: Bilateral vertebral arteries are patent to the vertebrobasilar junction, moderate tandem. No flow limiting stenosis or luminal irregularity. Source images and MIP images were reviewed. IMPRESSION: MRI HEAD: 1. Mildly motion degraded examination. Subcentimeter acute LEFT temporal lobe infarct. 2. Otherwise noncontrast MRI of the head for age. MRA HEAD: 1. Moderately motion degraded examination. Occluded LEFT vertebral artery. 2. 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 3. Moderate intracranial atherosclerosis. MRA NECK: 1. No hemodynamically significant stenosis ICA on this motion degraded examination. 2. Moderate tandem stenosis bilateral vertebral arteries. Electronically Signed   By: Elon Alas M.D.   On: 07/24/2017 18:27      Subjective: Patient was seen and examined 07/26/2017, 2:41 PM Patient stable  Today. No acute distress.  No issues overnight Stable for discharge.  Discharge Exam:  Vitals:   07/26/17 1100 07/26/17 1105 07/26/17 1120 07/26/17 1159  BP: (!) 174/57 (!) 168/66  (!) 151/70  Pulse: 70   (!) 58  Resp: 12   17  Temp:    98.1 F (36.7 C)  TempSrc:    Oral  SpO2: 92%  94% 98%  Weight:      Height:        General: Pt lying comfortably in bed & appears in no obvious distress. Cardiovascular: S1 & S2 heard, RRR, S1/S2 +. No murmurs, rubs, gallops or clicks. No JVD or  pedal edema. Respiratory: Clear to auscultation without wheezing, rhonchi or crackles. No increased work of breathing. Abdominal:  Non distended, non tender & soft. No organomegaly or masses appreciated. Normal bowel sounds heard. CNS: Alert and oriented. No focal deficits. Extremities: no  edema, no cyanosis    The results of significant diagnostics from this hospitalization (including imaging, microbiology, ancillary and laboratory) are listed below for reference.     Microbiology: No results found for this or any previous visit (from the past 240 hour(s)).   Labs: CBC: Recent Labs  Lab 07/20/17 2227 07/20/17 2249 07/24/17 1032  WBC 10.1  --  8.3  NEUTROABS 6.4  --   --   HGB 13.3 12.9* 13.9  HCT 40.6 38.0* 42.9  MCV 87.9  --  86.5  PLT 214  --  086   Basic Metabolic Panel: Recent Labs  Lab 07/20/17 2227 07/20/17 2249 07/24/17 1032  NA 139 143 140  K 4.1 4.0 3.9  CL 105 105 107  CO2 25  --  25  GLUCOSE 278* 272* 244*  BUN 23* 22* 14  CREATININE 1.26* 1.20 1.13  CALCIUM 9.4  --  9.2   Liver Function Tests: Recent Labs  Lab 07/20/17 2227  AST 17  ALT 17  ALKPHOS 65  BILITOT 0.5  PROT 6.7  ALBUMIN 3.6   BNP (last 3 results) No results for input(s): BNP in the last 8760 hours. Cardiac Enzymes: Recent Labs  Lab 07/20/17 2227 07/24/17 1528  CKTOTAL 123  --   CKMB 3.4  --   TROPONINI  --  <0.03   CBG: Recent Labs  Lab 07/25/17 1615 07/25/17 2139 07/26/17 0942 07/26/17 1148 07/26/17 1356  GLUCAP 103* 172* 176* 125* 195*   Hgb A1c Recent Labs    07/25/17 0655  HGBA1C 8.0*   Lipid Profile Recent Labs    07/25/17 0655  CHOL 123  HDL 27*  LDLCALC 41  TRIG 273*  CHOLHDL 4.6   Time coordinating discharge: Over 35 minutes  SIGNED: Deatra James, MD, FACP, FHM. [facility],  Pager [phone]  If 7PM-7AM, please contact night-coverage [email or web address] 07/26/2017, 2:41 PM

## 2017-07-26 NOTE — Plan of Care (Signed)
  Problem: Education: Goal: Knowledge of General Education information will improve Outcome: Progressing   Problem: Health Behavior/Discharge Planning: Goal: Ability to manage health-related needs will improve Outcome: Progressing   Problem: Clinical Measurements: Goal: Ability to maintain clinical measurements within normal limits will improve Outcome: Progressing Goal: Will remain free from infection Outcome: Progressing Goal: Diagnostic test results will improve Outcome: Progressing Goal: Respiratory complications will improve Outcome: Progressing Goal: Cardiovascular complication will be avoided Outcome: Progressing   Problem: Activity: Goal: Risk for activity intolerance will decrease Outcome: Progressing   Problem: Nutrition: Goal: Adequate nutrition will be maintained Outcome: Progressing   Problem: Coping: Goal: Level of anxiety will decrease Outcome: Progressing   Problem: Elimination: Goal: Will not experience complications related to bowel motility Outcome: Progressing Goal: Will not experience complications related to urinary retention Outcome: Progressing   Problem: Pain Managment: Goal: General experience of comfort will improve Outcome: Progressing   Problem: Safety: Goal: Ability to remain free from injury will improve Outcome: Progressing   Problem: Skin Integrity: Goal: Risk for impaired skin integrity will decrease Outcome: Progressing   Problem: Education: Goal: Knowledge of disease or condition will improve Outcome: Progressing Goal: Knowledge of secondary prevention will improve Outcome: Progressing Goal: Knowledge of patient specific risk factors addressed and post discharge goals established will improve Outcome: Progressing   Problem: Coping: Goal: Will verbalize positive feelings about self Outcome: Progressing   Problem: Health Behavior/Discharge Planning: Goal: Ability to manage health-related needs will improve Outcome:  Progressing   Problem: Self-Care: Goal: Ability to participate in self-care as condition permits will improve Outcome: Progressing   Problem: Nutrition: Goal: Risk of aspiration will decrease Outcome: Progressing   Problem: Ischemic Stroke/TIA Tissue Perfusion: Goal: Complications of ischemic stroke/TIA will be minimized Outcome: Progressing   Problem: Education: Goal: Knowledge of disease or condition will improve Outcome: Progressing Goal: Knowledge of secondary prevention will improve Outcome: Progressing Goal: Knowledge of patient specific risk factors addressed and post discharge goals established will improve Outcome: Progressing   Problem: Ischemic Stroke/TIA Tissue Perfusion: Goal: Complications of ischemic stroke/TIA will be minimized Outcome: Progressing

## 2017-07-26 NOTE — Consult Note (Addendum)
ELECTROPHYSIOLOGY CONSULT NOTE  Patient ID: Patrick Phillips MRN: [id number], DOB/AGE: 08-05-1940   Admit date: 07/24/2017 Date of Consult: 07/26/2017  Primary Physician: System, Pcp Not In Primary Cardiologist: none Reason for Consultation: Cryptogenic stroke ; recommendations regarding Implantable Loop Recorder, requested by Dr. Leonie Man  History of Present Illness Patrick Phillips was admitted on 07/24/2017 with R sided weakness.  They first developed symptoms while at home.  PMHx include HTN, DM, HLD.  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  he has undergone workup for stroke including TEE and carotid angio.  The patient has been monitored on telemetry which has demonstrated sinus rhythm with no arrhythmias.     Echocardiogram this admission demonstrated.  Study Conclusions  - Left ventricle: The cavity size was normal. Wall thickness was   increased in a pattern of moderate LVH. Systolic function was   normal. The estimated ejection fraction was in the range of 60%   to 65%. Wall motion was normal; there were no regional wall   motion abnormalities. Doppler parameters are consistent with   abnormal left ventricular relaxation (grade 1 diastolic   dysfunction). The E/e&' ratio is between 8-15, suggesting   indeterminate LV filling pressure. - Left atrium: The atrium was mildly dilated. 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 - Inferior vena cava: The vessel was normal in size. The   respirophasic diameter changes were in the normal range (>= 50%),   consistent with normal central venous pressure. Impressions: - LVEF 60-65%, moderate LVH, normal wall motion, grade 1 DD,   indeterminate LV filling pressure, mild LAE, aneurysmal IAS - PFO   cannot be excluded. Recommend bubble study or TEE bubble study,   normal IVC.   TEE LA, LAA without masses TV normal   MIld TR MV normal  MIld MR AV mildly thickened with small Lambl's  excresence.   No AI PV normal    LVEF and RVEF normal    Normal thoracic aorta  No PFO by color doppler or with injection of agitated saline   Procedure without complication.     Lab work is reviewed.  Prior to admission, the patient denies chest pain, shortness of breath, dizziness, palpitations, or syncope.  They are recovering from their stroke with plans to home at discharge.      Past Medical History:  Diagnosis Date  . Cancer ([place])    skin cancer  . Diabetes mellitus without complication ([place])   . Hypertension      Surgical History:  Past Surgical History:  Procedure Laterality Date  . BACK SURGERY    . HERNIA REPAIR    . TRIGGER FINGER RELEASE Right 12/02/2015   Procedure: RIGHT THUMB RELEASE TRIGGER FINGER/A-1 PULLEY;  Surgeon: Leanora Cover, MD;  Location: [facility];  Service: Orthopedics;  Laterality: Right;     Medications Prior to Admission  Medication Sig Dispense Refill Last Dose  . amLODipine (NORVASC) 5 MG tablet Take 5 mg by mouth 2 (two) times daily.   07/24/2017 at 0830  . atorvastatin (LIPITOR) 20 MG tablet Take 20 mg by mouth daily.   07/24/2017 at 0830  . escitalopram (LEXAPRO) 10 MG tablet Take 10 mg by mouth daily.   07/24/2017 at 0830  . glipiZIDE (GLUCOTROL) 5 MG tablet Take 5 mg by mouth daily.   07/24/2017 at 0830  . metFORMIN (GLUCOPHAGE) 500 MG tablet Take 1,000 mg by mouth 2 (two) times daily.   07/24/2017  at 0830    Inpatient Medications:  . aspirin EC  81 mg Oral Daily  . atorvastatin  20 mg Oral Daily  . clopidogrel  75 mg Oral Daily  . enoxaparin (LOVENOX) injection  40 mg Subcutaneous Q24H  . escitalopram  10 mg Oral Daily  . glipiZIDE  5 mg Oral QAC breakfast  . metFORMIN  1,000 mg Oral BID WC    Allergies: No Known Allergies  Social History   Socioeconomic History  . Marital status: Married    Spouse name: Not on file  . Number of children: Not on file  . Years of education: Not on file  . Highest  education level: Not on file  Occupational History  . Not on file  Social Needs  . Financial resource strain: Not on file  . Food insecurity:    Worry: Not on file    Inability: Not on file  . Transportation needs:    Medical: Not on file    Non-medical: Not on file  Tobacco Use  . Smoking status: Never Smoker  . Smokeless tobacco: Never Used  Substance and Sexual Activity  . Alcohol use: Yes    Comment: occ  . Drug use: No  . Sexual activity: Not on file  Lifestyle  . Physical activity:    Days per week: Not on file    Minutes per session: Not on file  . Stress: Not on file  Relationships  . Social connections:    Talks on phone: Not on file    Gets together: Not on file    Attends religious service: Not on file    Active member of club or organization: Not on file    Attends meetings of clubs or organizations: Not on file    Relationship status: Not on file  . Intimate partner violence:    Fear of current or ex partner: Not on file    Emotionally abused: Not on file    Physically abused: Not on file    Forced sexual activity: Not on file  Other Topics Concern  . Not on file  Social History Narrative  . Not on file     Family History  Problem Relation Age of Onset  . Cancer Mother   . Cancer Father   . Cancer Brother   . Cancer Other       Review of Systems: All other systems reviewed and are otherwise negative except as noted above.  Physical Exam: Vitals:   07/26/17 1100 07/26/17 1105 07/26/17 1120 07/26/17 1159  BP: (!) 174/57 (!) 168/66  (!) 151/70  Pulse: 70   (!) 58  Resp: 12   17  Temp:    98.1 F (36.7 C)  TempSrc:    Oral  SpO2: 92%  94% 98%  Weight:      Height:        GEN- The patient is well appearing, alert and oriented x 3 today.   Head- normocephalic, atraumatic Eyes-  Sclera clear, conjunctiva pink Ears- hearing intact Oropharynx- clear Neck- supple Lungs- CTA b/l, normal work of breathing Heart- RRR, no murmurs, rubs or  gallops  GI- soft, NT, ND Extremities- no clubbing, cyanosis, or edema MS- no significant deformity or atrophy Skin- no rash or lesion Psych- euthymic mood, full affect   Labs:   Lab Results  Component Value Date   WBC 8.3 07/24/2017   HGB 13.9 07/24/2017   HCT 42.9 07/24/2017   MCV 86.5 07/24/2017  PLT 214 07/24/2017    Recent Labs  Lab 07/20/17 2227  07/24/17 1032  NA 139   < > 140  K 4.1   < > 3.9  CL 105   < > 107  CO2 25  --  25  BUN 23*   < > 14  CREATININE 1.26*   < > 1.13  CALCIUM 9.4  --  9.2  PROT 6.7  --   --   BILITOT 0.5  --   --   ALKPHOS 65  --   --   ALT 17  --   --   AST 17  --   --   GLUCOSE 278*   < > 244*   < > = values in this interval not displayed.   Lab Results  Component Value Date   CKTOTAL 123 07/20/2017   CKMB 3.4 07/20/2017   TROPONINI <0.03 07/24/2017   Lab Results  Component Value Date   CHOL 123 07/25/2017   Lab Results  Component Value Date   HDL 27 (L) 07/25/2017   Lab Results  Component Value Date   LDLCALC 41 07/25/2017   Lab Results  Component Value Date   TRIG 273 (H) 07/25/2017   Lab Results  Component Value Date   CHOLHDL 4.6 07/25/2017   No results found for: LDLDIRECT  Lab Results  Component Value Date   DDIMER 0.68 (H) 12/22/2015     Radiology/Studies:   Dg Chest 2 View Result Date: 07/24/2017 CLINICAL DATA:  Weakness. Intermittent chest pain. Shortness of breath. EXAM: CHEST - 2 VIEW COMPARISON:  07/20/2017. FINDINGS: Mediastinum and hilar structures normal. Lungs are clear. No pleural effusion or pneumothorax. Cardiomegaly with normal pulmonary vascularity. No acute bony abnormality. Degenerative changes thoracic spine with thoracic spine scoliosis. IMPRESSION: No acute cardiopulmonary disease. Electronically Signed   By: Marcello Moores  Register   On: 07/24/2017 11:53     Ct Head Wo Contrast Result Date: 07/20/2017 CLINICAL DATA:  Right foot numbness and pain in the right hand EXAM: CT HEAD WITHOUT  CONTRAST TECHNIQUE: Contiguous axial images were obtained from the base of the skull through the vertex without intravenous contrast. COMPARISON:  02/23/2017 FINDINGS: Brain: No acute territorial infarction, hemorrhage or intracranial mass. 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 Moderate atrophy. Stable ventricle size. Vascular: No hyperdense vessels. Carotid vascular calcification. Vertebral artery calcification. Skull: Normal. Negative for fracture or focal lesion. Sinuses/Orbits: Moderate mucosal thickening in the ethmoids sinuses with mild thickening in the frontal and maxillary sinuses. Other: None. IMPRESSION: 1. No CT evidence for acute intracranial abnormality. 2. Atrophy Electronically Signed   By: Donavan Foil M.D.   On: 07/20/2017 23:37    Mr Angiogram Head Wo Contrast Result Date: 07/24/2017 CLINICAL DATA:  Weakness in RIGHT leg numbness since Jul 21, 2017. History of hypertension and diabetes. EXAM: MRI HEAD WITHOUT CONTRAST MRA HEAD WITHOUT CONTRAST MRA NECK WITHOUT AND WITH CONTRAST TECHNIQUE: Multiplanar, multiecho pulse sequences of the brain and surrounding structures were obtained without intravenous contrast. Angiographic images of the Circle of Willis were obtained using MRA technique without intravenous contrast. Angiographic images of the neck were obtained using MRA technique without and with intravenous contrast. Carotid stenosis measurements (when applicable) are obtained utilizing NASCET criteria, using the distal internal carotid diameter as the denominator. CONTRAST:  11mL MULTIHANCE GADOBENATE DIMEGLUMINE 529 MG/ML IV SOLN COMPARISON:  None. FINDINGS: MRI HEAD sequences vary from mild to moderately motion degraded. INTRACRANIAL CONTENTS: 5 mm reduced diffusion LEFT superior temporal  gyrus with low ADC values. No susceptibility artifact to suggest hemorrhage. RIGHT inferior basal ganglia perivascular space. The ventricles and sulci are normal for patient's age. Faint  supratentorial white matter FLAIR T2 hyperintensities compatible with mild chronic small vessel ischemic disease, less than expected for age. No suspicious parenchymal signal, masses, mass effect. No abnormal extra-axial fluid collections. No extra-axial masses. 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 SKULL AND UPPER CERVICAL SPINE: No abnormal sellar expansion. No suspicious calvarial bone marrow signal. Craniocervical junction maintained. SINUSES/ORBITS: Moderate paranasal sinusitis. Mastoid air cells are well aerated. Included ocular globes and orbital contents are non-suspicious. OTHER: None. MRA HEAD FINDINGS-moderately motion degraded examination. ANTERIOR CIRCULATION: Normal flow related enhancement of the included cervical, petrous, cavernous and supraclinoid internal carotid arteries. Patent anterior communicating artery. Azygos versus occluded LEFT ACA with moderate tandem stenosis. Patent bilateral middle cerebral arteries with moderate tandem stenosis. No large vessel occlusion, flow limiting stenosis. POSTERIOR CIRCULATION: Loss of LEFT V4 vertebral artery flow related enhancement. Basilar artery is patent, with normal flow related enhancement of the main branch vessels. Patent posterior cerebral arteries. No flow limiting stenosis. ANATOMIC VARIANTS: Hypoplastic LEFT A1 segment. Source images and MIP images were reviewed. MRA NECK FINDINGS-moderate motion degraded examination. ANTERIOR CIRCULATION: The common carotid arteries are widely patent bilaterally. The carotid bifurcations are patent bilaterally without hemodynamically significant stenosis by NASCET criteria. No flow limiting stenosis or luminal irregularity. POSTERIOR CIRCULATION: Bilateral vertebral arteries are patent to the vertebrobasilar junction, moderate tandem. No flow limiting stenosis or luminal irregularity. Source images and MIP images were reviewed. IMPRESSION: MRI HEAD: 1. Mildly motion  degraded examination. Subcentimeter acute LEFT temporal lobe infarct. 2. Otherwise noncontrast MRI of the head for age. MRA HEAD: 1. Moderately motion degraded examination. Occluded LEFT vertebral artery. 2. 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 3. Moderate intracranial atherosclerosis. MRA NECK: 1. No hemodynamically significant stenosis ICA on this motion degraded examination. 2. Moderate tandem stenosis bilateral vertebral arteries. Electronically Signed   By: Elon Alas M.D.   On: 07/24/2017 18:27    12-lead ECG SR only All prior EKG's in EPIC reviewed with no documented atrial fibrillation  Telemetry SB 50's  Assessment and Plan:  1. Cryptogenic stroke The patient presents with cryptogenic stroke.  The patient has  Had a TEE without thrombus or PFO.  Dr. Lovena Le spoke at length with the patient about monitoring for afib with either a 30 day event monitor or an implantable loop recorder.  Risks, benefits, and alteratives to implantable loop recorder were discussed with the patient today.   He is s/p sedation, his 2 daughters are at bedside.  The patient reports good understanding, mentions Dr. Leonie Man as well as other MD had spoken to him yesterday already as well and at this time, the patient is very clear in his decision to proceed with implantable loop recorder.   Wound care was reviewed with the patient (keep incision clean and dry for 3 days).  Wound check will be scheduled for the patient  Please call with questions.   Renee Dyane Dustman, PA-C 07/26/2017  EP Attending  Patient seen and examined. Agree with above. The patient has had a cryptogenic stroke, and has undergone TEE and has no obvious etiology to explain his stroke. I have reviewed the treatment options with the patient and he wishes to proceed with insertion of an ILR. He has had no documentation of atrial fib but is at increased risk.  Mikle Bosworth.D.

## 2017-07-26 NOTE — Progress Notes (Addendum)
STROKE TEAM PROGRESS NOTE  INTERVAL HISTORY His family is at the bedside.  Had TEE this morning which was unrevealing.  Plans for loop recorder placement within the next hour.  He is anxious to go home.   Vitals:   07/26/17 1100 07/26/17 1105 07/26/17 1120 07/26/17 1159  BP: (!) 174/57 (!) 168/66  (!) 151/70  Pulse: 70   (!) 58  Resp: 12   17  Temp:    98.1 F (36.7 C)  TempSrc:    Oral  SpO2: 92%  94% 98%  Weight:      Height:        CBC:  Recent Labs  Lab 07/20/17 2227 07/20/17 2249 07/24/17 1032  WBC 10.1  --  8.3  NEUTROABS 6.4  --   --   HGB 13.3 12.9* 13.9  HCT 40.6 38.0* 42.9  MCV 87.9  --  86.5  PLT 214  --  921    Basic Metabolic Panel:  Recent Labs  Lab 07/20/17 2227 07/20/17 2249 07/24/17 1032  NA 139 143 140  K 4.1 4.0 3.9  CL 105 105 107  CO2 25  --  25  GLUCOSE 278* 272* 244*  BUN 23* 22* 14  CREATININE 1.26* 1.20 1.13  CALCIUM 9.4  --  9.2   Lipid Panel:     Component Value Date/Time   CHOL 123 07/25/2017 0655   TRIG 273 (H) 07/25/2017 0655   HDL 27 (L) 07/25/2017 0655   CHOLHDL 4.6 07/25/2017 0655   VLDL 55 (H) 07/25/2017 0655   LDLCALC 41 07/25/2017 0655   HgbA1c:  Lab Results  Component Value Date   HGBA1C 8.0 (H) 07/25/2017   Urine Drug Screen: No results found for: LABOPIA, COCAINSCRNUR, LABBENZ, AMPHETMU, THCU, LABBARB  Alcohol Level     Component Value Date/Time   [facility] <10 07/20/2017 2227    IMAGING Mr Angiogram Head Wo Contrast  Result Date: 07/24/2017 CLINICAL DATA:  Weakness in RIGHT leg numbness since Jul 21, 2017. History of hypertension and diabetes. EXAM: MRI HEAD WITHOUT CONTRAST MRA HEAD WITHOUT CONTRAST MRA NECK WITHOUT AND WITH CONTRAST TECHNIQUE: Multiplanar, multiecho pulse sequences of the brain and surrounding structures were obtained without intravenous contrast. Angiographic images of the Circle of Willis were obtained using MRA technique without intravenous contrast. Angiographic images of the neck were  obtained using MRA technique without and with intravenous contrast. Carotid stenosis measurements (when applicable) are obtained utilizing NASCET criteria, using the distal internal carotid diameter as the denominator. CONTRAST:  12mL MULTIHANCE GADOBENATE DIMEGLUMINE 529 MG/ML IV SOLN COMPARISON:  None. FINDINGS: MRI HEAD sequences vary from mild to moderately motion degraded. INTRACRANIAL CONTENTS: 5 mm reduced diffusion LEFT superior temporal gyrus with low ADC values. No susceptibility artifact to suggest hemorrhage. RIGHT inferior basal ganglia perivascular space. The ventricles and sulci are normal for patient's age. Faint supratentorial white matter FLAIR T2 hyperintensities compatible with mild chronic small vessel ischemic disease, less than expected for age. No suspicious parenchymal signal, masses, mass effect. No abnormal extra-axial fluid collections. No extra-axial masses. 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 SKULL AND UPPER CERVICAL SPINE: No abnormal sellar expansion. No suspicious calvarial bone marrow signal. Craniocervical junction maintained. SINUSES/ORBITS: Moderate paranasal sinusitis. Mastoid air cells are well aerated. Included ocular globes and orbital contents are non-suspicious. OTHER: None. MRA HEAD FINDINGS-moderately motion degraded examination. ANTERIOR CIRCULATION: Normal flow related enhancement of the included cervical, petrous, cavernous and supraclinoid internal carotid arteries. Patent anterior communicating artery. Azygos  versus occluded LEFT ACA with moderate tandem stenosis. Patent bilateral middle cerebral arteries with moderate tandem stenosis. No large vessel occlusion, flow limiting stenosis. POSTERIOR CIRCULATION: Loss of LEFT V4 vertebral artery flow related enhancement. Basilar artery is patent, with normal flow related enhancement of the main branch vessels. Patent posterior cerebral arteries. No flow limiting stenosis. ANATOMIC  VARIANTS: Hypoplastic LEFT A1 segment. Source images and MIP images were reviewed. MRA NECK FINDINGS-moderate motion degraded examination. ANTERIOR CIRCULATION: The common carotid arteries are widely patent bilaterally. The carotid bifurcations are patent bilaterally without hemodynamically significant stenosis by NASCET criteria. No flow limiting stenosis or luminal irregularity. POSTERIOR CIRCULATION: Bilateral vertebral arteries are patent to the vertebrobasilar junction, moderate tandem. No flow limiting stenosis or luminal irregularity. Source images and MIP images were reviewed. IMPRESSION: MRI HEAD: 1. Mildly motion degraded examination. Subcentimeter acute LEFT temporal lobe infarct. 2. Otherwise noncontrast MRI of the head for age. MRA HEAD: 1. Moderately motion degraded examination. Occluded LEFT vertebral artery. 2. 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 3. Moderate intracranial atherosclerosis. MRA NECK: 1. No hemodynamically significant stenosis ICA on this motion degraded examination. 2. Moderate tandem stenosis bilateral vertebral arteries. Electronically Signed   By: Elon Alas M.D.   On: 07/24/2017 18:27   Mr Angiogram Neck W Or Wo Contrast  Result Date: 07/24/2017 CLINICAL DATA:  Weakness in RIGHT leg numbness since Jul 21, 2017. History of hypertension and diabetes. EXAM: MRI HEAD WITHOUT CONTRAST MRA HEAD WITHOUT CONTRAST MRA NECK WITHOUT AND WITH CONTRAST TECHNIQUE: Multiplanar, multiecho pulse sequences of the brain and surrounding structures were obtained without intravenous contrast. Angiographic images of the Circle of Willis were obtained using MRA technique without intravenous contrast. Angiographic images of the neck were obtained using MRA technique without and with intravenous contrast. Carotid stenosis measurements (when applicable) are obtained utilizing NASCET criteria, using the distal internal carotid diameter as the denominator. CONTRAST:  41mL MULTIHANCE GADOBENATE  DIMEGLUMINE 529 MG/ML IV SOLN COMPARISON:  None. FINDINGS: MRI HEAD sequences vary from mild to moderately motion degraded. INTRACRANIAL CONTENTS: 5 mm reduced diffusion LEFT superior temporal gyrus with low ADC values. No susceptibility artifact to suggest hemorrhage. RIGHT inferior basal ganglia perivascular space. The ventricles and sulci are normal for patient's age. Faint supratentorial white matter FLAIR T2 hyperintensities compatible with mild chronic small vessel ischemic disease, less than expected for age. No suspicious parenchymal signal, masses, mass effect. No abnormal extra-axial fluid collections. No extra-axial masses. 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 SKULL AND UPPER CERVICAL SPINE: No abnormal sellar expansion. No suspicious calvarial bone marrow signal. Craniocervical junction maintained. SINUSES/ORBITS: Moderate paranasal sinusitis. Mastoid air cells are well aerated. Included ocular globes and orbital contents are non-suspicious. OTHER: None. MRA HEAD FINDINGS-moderately motion degraded examination. ANTERIOR CIRCULATION: Normal flow related enhancement of the included cervical, petrous, cavernous and supraclinoid internal carotid arteries. Patent anterior communicating artery. Azygos versus occluded LEFT ACA with moderate tandem stenosis. Patent bilateral middle cerebral arteries with moderate tandem stenosis. No large vessel occlusion, flow limiting stenosis. POSTERIOR CIRCULATION: Loss of LEFT V4 vertebral artery flow related enhancement. Basilar artery is patent, with normal flow related enhancement of the main branch vessels. Patent posterior cerebral arteries. No flow limiting stenosis. ANATOMIC VARIANTS: Hypoplastic LEFT A1 segment. Source images and MIP images were reviewed. MRA NECK FINDINGS-moderate motion degraded examination. ANTERIOR CIRCULATION: The common carotid arteries are widely patent bilaterally. The carotid bifurcations are patent  bilaterally without hemodynamically significant stenosis by NASCET criteria. No flow limiting stenosis or luminal irregularity.  POSTERIOR CIRCULATION: Bilateral vertebral arteries are patent to the vertebrobasilar junction, moderate tandem. No flow limiting stenosis or luminal irregularity. Source images and MIP images were reviewed. IMPRESSION: MRI HEAD: 1. Mildly motion degraded examination. Subcentimeter acute LEFT temporal lobe infarct. 2. Otherwise noncontrast MRI of the head for age. MRA HEAD: 1. Moderately motion degraded examination. Occluded LEFT vertebral artery. 2. 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 3. Moderate intracranial atherosclerosis. MRA NECK: 1. No hemodynamically significant stenosis ICA on this motion degraded examination. 2. Moderate tandem stenosis bilateral vertebral arteries. Electronically Signed   By: Elon Alas M.D.   On: 07/24/2017 18:27   Mr Brain Wo Contrast  Result Date: 07/24/2017 CLINICAL DATA:  Weakness in RIGHT leg numbness since Jul 21, 2017. History of hypertension and diabetes. EXAM: MRI HEAD WITHOUT CONTRAST MRA HEAD WITHOUT CONTRAST MRA NECK WITHOUT AND WITH CONTRAST TECHNIQUE: Multiplanar, multiecho pulse sequences of the brain and surrounding structures were obtained without intravenous contrast. Angiographic images of the Circle of Willis were obtained using MRA technique without intravenous contrast. Angiographic images of the neck were obtained using MRA technique without and with intravenous contrast. Carotid stenosis measurements (when applicable) are obtained utilizing NASCET criteria, using the distal internal carotid diameter as the denominator. CONTRAST:  19mL MULTIHANCE GADOBENATE DIMEGLUMINE 529 MG/ML IV SOLN COMPARISON:  None. FINDINGS: MRI HEAD sequences vary from mild to moderately motion degraded. INTRACRANIAL CONTENTS: 5 mm reduced diffusion LEFT superior temporal gyrus with low ADC values. No susceptibility artifact to suggest hemorrhage. RIGHT  inferior basal ganglia perivascular space. The ventricles and sulci are normal for patient's age. Faint supratentorial white matter FLAIR T2 hyperintensities compatible with mild chronic small vessel ischemic disease, less than expected for age. No suspicious parenchymal signal, masses, mass effect. No abnormal extra-axial fluid collections. No extra-axial masses. 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 SKULL AND UPPER CERVICAL SPINE: No abnormal sellar expansion. No suspicious calvarial bone marrow signal. Craniocervical junction maintained. SINUSES/ORBITS: Moderate paranasal sinusitis. Mastoid air cells are well aerated. Included ocular globes and orbital contents are non-suspicious. OTHER: None. MRA HEAD FINDINGS-moderately motion degraded examination. ANTERIOR CIRCULATION: Normal flow related enhancement of the included cervical, petrous, cavernous and supraclinoid internal carotid arteries. Patent anterior communicating artery. Azygos versus occluded LEFT ACA with moderate tandem stenosis. Patent bilateral middle cerebral arteries with moderate tandem stenosis. No large vessel occlusion, flow limiting stenosis. POSTERIOR CIRCULATION: Loss of LEFT V4 vertebral artery flow related enhancement. Basilar artery is patent, with normal flow related enhancement of the main branch vessels. Patent posterior cerebral arteries. No flow limiting stenosis. ANATOMIC VARIANTS: Hypoplastic LEFT A1 segment. Source images and MIP images were reviewed. MRA NECK FINDINGS-moderate motion degraded examination. ANTERIOR CIRCULATION: The common carotid arteries are widely patent bilaterally. The carotid bifurcations are patent bilaterally without hemodynamically significant stenosis by NASCET criteria. No flow limiting stenosis or luminal irregularity. POSTERIOR CIRCULATION: Bilateral vertebral arteries are patent to the vertebrobasilar junction, moderate tandem. No flow limiting stenosis or luminal  irregularity. Source images and MIP images were reviewed. IMPRESSION: MRI HEAD: 1. Mildly motion degraded examination. Subcentimeter acute LEFT temporal lobe infarct. 2. Otherwise noncontrast MRI of the head for age. MRA HEAD: 1. Moderately motion degraded examination. Occluded LEFT vertebral artery. 2. 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 3. Moderate intracranial atherosclerosis. MRA NECK: 1. No hemodynamically significant stenosis ICA on this motion degraded examination. 2. Moderate tandem stenosis bilateral vertebral arteries. Electronically Signed   By: Elon Alas M.D.   On: 07/24/2017 18:27   2D Echocardiogram  -  Left ventricle: The cavity size was normal. Wall thickness was increased in a pattern of moderate LVH. Systolic function was normal. The estimated ejection fraction was in the range of 60% to 65%. Wall motion was normal; there were no regional wall motion abnormalities. Doppler parameters are consistent with abnormal left ventricular relaxation (grade 1 diastolic dysfunction). The E/e&' ratio is between 8-15, suggesting indeterminate LV filling pressure. - Left atrium: The atrium was mildly dilated. 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 - Inferior vena cava: The vessel was normal in size. The respirophasic diameter changes were in the normal range (>= 50%), consistent with normal central venous pressure. Impressions:  LVEF 60-65%, moderate LVH, normal wall motion, grade 1 DD, indeterminate LV filling pressure, mild LAE, aneurysmal IAS - PFO cannot be excluded. Recommend bubble study or TEE bubble study, normal IVC.  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   No AI PV normal    LVEF and RVEF normal    Normal thoracic aorta No PFO by color doppler or with injection of agitated saline  Procedure without complication.   PHYSICAL EXAM Constitutional: Appears well-developed and  well-nourished.  Psych: Affect appropriate to situation Eyes: No scleral injection HENT: No OP obstrucion Head: Normocephalic.  Cardiovascular: Normal rate and regular rhythm.  Respiratory: Effort normal, non-labored breathing Skin: WDI Neuro: Mental Status: Patient is awake, alert, oriented to person, place, month, and situation. He states he doesn't know the year normally only when he gets his check. Patient is able to give a clear and coherent history. No signs of aphasia or neglect Cranial Nerves: II: Visual Fields are full. Pupils are equal, round, and reactive to light.   III,IV, VI: EOMI without ptosis or diploplia.  V: Facial sensation is symmetric to light touch VII: mild R lower Facial weakness (noted corner of mouth) otherwise, movement is symmetric.  VIII: hearing is intact to voice X: Uvula elevates symmetrically XI: Shoulder shrug is symmetric. XII: tongue is midline without atrophy or fasciculations.  Motor: Tone is normal. Bulk is normal. 5/5 strength was present in all four extremities Sensory: Sensation is symmetric to light touch in the arms and legs. Plantars: Toes are downgoing bilaterally.  Cerebellar: FNF and HKS are intact bilaterally   ASSESSMENT/PLAN [name] is a 76 y.o. male with history of hypertension, diabetes, hyperlipidemia, alcohol use and sinus bradycardia presenting with right arm numbness that started on Friday, May 25.   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 Atrophy.   MRI motion degraded.  Subcentimeter left temporal lobe infarct  MRA head motion degraded.  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  Moderate intracranial atherosclerosis  MRA neck motion degraded.  Unremarkable.  Moderate bilateral VA tandem stenosis  2D Echo  EF 60-65%. No source of embolus   TEE no SOE, no PFO  imsertable loop recorder placed 07/26/2017 by Dr.  Lovena Le  LDL 41  HgbA1c 8.0  Lovenox 40 mg sq daily for VTE prophylaxis Diet Order           Diet Heart Room service appropriate? Yes; Fluid consistency: Thin  Diet effective now          No antithrombotic prior to admission, now on aspirin 81 mg daily and clopidogrel 75 mg daily x 3 weeks, then aspirin alone.   Therapy  recommendations: No therapy needs  Disposition: Return home  Hypertension  Stable . Permissive hypertension (OK if < 220/120) but gradually normalize in 5-7 days . Long-term BP goal normotensive  Hyperlipidemia  Home meds: Lipitor 20, resumed in hospital  LDL 41, goal < 70  Continue statin at discharge  Diabetes type II  HgbA1c 8.0, goal < 7.0  Uncontrolled  Discussed he needs tighter control, he states he eats what he wants  Other Stroke Risk Factors  Advanced age  ETOH use, advised to drink no more than 2 drink(s) a day  Other Active Problems  Sinus bradycardia  Hospital day # 2  Burnetta Sabin, MSN, APRN, ANVP-BC, AGPCNP-BC Advanced Practice Stroke Nurse [place] for Schedule & Pager information 07/26/2017 1:42 PM  TEE was unremarkable. Plan is to place loop recorder prior to discharging home today. Follow-up as an outpatient in stroke clinic in 6 weeks. Long discussion with patient and multiple family members at the bedside and answered questions. Antony Contras, MD  To contact Stroke Continuity provider, please refer to [email or web address] After hours, contact General Neurology

## 2017-07-26 NOTE — Progress Notes (Signed)
  Echocardiogram Echocardiogram Transesophageal has been performed.  Patrick Phillips L Androw 07/26/2017, 11:13 AM

## 2017-07-26 NOTE — Interval H&P Note (Signed)
History and Physical Interval Note:  07/26/2017 10:30 AM  Patrick Phillips  has presented today for surgery, with the diagnosis of stroke  The various methods of treatment have been discussed with the patient and family. After consideration of risks, benefits and other options for treatment, the patient has consented to  Procedure(s): TRANSESOPHAGEAL ECHOCARDIOGRAM (TEE) (N/A) as a surgical intervention .  The patient's history has been reviewed, patient examined, no change in status, stable for surgery.  I have reviewed the patient's chart and labs.  Questions were answered to the patient's satisfaction.     [name]

## 2017-07-27 ENCOUNTER — Encounter (HOSPITAL_COMMUNITY): Payer: Self-pay | Admitting: Internal Medicine

## 2017-07-27 NOTE — Discharge Summary (Signed)
Physician Discharge Summary [facility]    Expand All Collapse All         Physician Discharge Summary [facility]    Patient: Patrick Phillips                                                    Admit date: 07/24/2017   DOB: 1940/11/21                                                            Discharge date:07/26/2017/2:41 PM [id number]                                                                                                                                                                                     PCP: System, Pcp Not In  Recommendations for Outpatient Follow-up:   1.  Please follow-up with your primary care physician within 1-2 weeks. 2.  Please follow-up with a cardiologist regarding the loop recorder evaluation  Discharge Condition: Stable  CODE STATUS:  Full code   Diet recommendation:  Cardiac/diabetic diet  ----------------------------------------------------------------------------------------------------------------------  Discharge Diagnoses:   Principal Problem:   Acute ischemic stroke ([place]) Active Problems:   Hypertension   Diabetes mellitus without complication ([place])   Sinus bradycardia   History of present illness : Patrick Phillips a 77 y.o.malewith medical history significant ofHTN, DM2. Patient initially seen 5/25 at [facility] with CP and R sided numbness and weakness. Denies any symptoms today. Patient stated that on Friday his right arm felt like it was going to fall off. He noticed the weakness first and then later it became numb. Called his daughter about 8 pm that night and they went to [employer]. At [facility] Troponin was 0.11. He was told at [facility] that he would be admitted and transferred to [facility]. Patient did not want to be admitted and left . Today daughter called PCP, and his PCP told him to come back to the hospital so he came to [facility]. Patient denies also denies any SOB, vision changes  Patient states that on Friday he has spent all day in 90+ degree weather mowing the lawn and tending to his yard. He also had to mow along an uneven ditch. Per patient this is the reason for his right side being weak.  MRI  brain confirms punctate acute ischemic stroke L temporal lobe. MRA head and neck shows moderate atherosclerosis. No hemodynamically significant ICA stenosis bilaterally. 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 L vertebral art occlusion MRA brain. 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  Hospital course / Brief Summary: Subsequently patient was admitted, neurology was consulted, patient is complete acute stroke work-up On the causes a contributing factor to his acute CVA was identified cardiology recommended TEE which was within normal limits, no PFO, we have increased his Lipitor from 20 to 40 mg daily, per cardiology recommendation added aspirin and Plavix to be continued for total of 3 weeks then aspirin daily.  For detailed discharge summary please see below:   Stroke: Left temporal lobe embolic infarct secondary to unknown source  CT head No acute stroke. Atrophy.   MRI motion degraded. Subcentimeter left temporal lobe infarct  MRA head motion degraded. 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 Moderate intracranial atherosclerosis  MRA neck motion degraded. Unremarkable. Moderate bilateral VA tandem stenosis  2D EchoEF60-65%. No source of embolus  TEEno SOE, no PFO  Post loop recorder placement on 07/26/2017 by Dr. Lovena Le   Hyperlipidemia -Goal of LDL41 -continue statins  Diabetes mellitus type 2 HgbA1c8.0 -strict diabetic diet, current medication will be titrated by PCP for A1c goal of 6.5 Continue home medications of glipizide and metformin  Sinus bradycardia -Improved heart rate    Consultations:  Neurologist/cardiologist  Procedures:  TEE/loop recorder  placement  ----------------------------------------------------------------------------------------------------------------------  Discharge Instructions:       Discharge Instructions    Activity as tolerated - No restrictions   Complete by:  As directed    Ambulatory referral to Neurology   Complete by:  As directed    Follow up with stroke clinic NP (Jessica Vanschaick or Cecille Rubin, if both not available, consider Dr. Antony Contras, Dr. Bess Harvest, or Dr. Sarina Ill) at [facility] in about 4 weeks.   Diet - low sodium heart healthy   Complete by:  As directed    Discharge instructions   Complete by:  As directed    Please follow-up with your cardiologist regarding your loop recorder to rule out any atrial fibrillation. Continue aspirin and Plavix for total of 3 weeks then continue with only aspirin per neurology recommendations Please follow-up with your primary care physician and neurologist in 2 to 3 weeks.   Increase activity slowly   Complete by:  As directed        Medication List    TAKE these medications   amLODipine 5 MG tablet Commonly known as:  NORVASC Take 5 mg by mouth 2 (two) times daily.   aspirin 81 MG EC tablet Take 1 tablet (81 mg total) by mouth daily. Start taking on:  07/27/2017   atorvastatin 20 MG tablet Commonly known as:  LIPITOR Take 2 tablets (40 mg total) by mouth daily. What changed:  how much to take   clopidogrel 75 MG tablet Commonly known as:  PLAVIX Take 1 tablet (75 mg total) by mouth daily for 21 days. Start taking on:  07/27/2017   escitalopram 10 MG tablet Commonly known as:  LEXAPRO Take 10 mg by mouth daily.   glipiZIDE 5 MG tablet Commonly known as:  GLUCOTROL Take 5 mg by mouth daily.   metFORMIN 500 MG tablet Commonly known as:  GLUCOPHAGE Take 1,000 mg by mouth 2 (two) times daily.         Follow-up Information    Schedule an  appointment as soon as  possible for a visit  with Hess, Algernon Huxley, PA-C.   Contact information: [place] [address] [phone]        [facility] Follow up in 4 week(s).   Specialty:  Neurology Why:  Stroke clinic.  Office will call with appointment date and time. Contact information: [address] [place] [phone]         No Known Allergies    Procedures/Studies: ImagingResults   Dg Chest 2 View  Result Date: 07/24/2017 CLINICAL DATA:  Weakness. Intermittent chest pain. Shortness of breath. EXAM: CHEST - 2 VIEW COMPARISON:  07/20/2017. FINDINGS: Mediastinum and hilar structures normal. Lungs are clear. No pleural effusion or pneumothorax. Cardiomegaly with normal pulmonary vascularity. No acute bony abnormality. Degenerative changes thoracic spine with thoracic spine scoliosis. IMPRESSION: No acute cardiopulmonary disease. Electronically Signed   By: Marcello Moores  Register   On: 07/24/2017 11:53   Dg Chest 2 View  Result Date: 07/20/2017 CLINICAL DATA:  Shortness of breath, onset tonight. Right foot numbness and right hand pain starting 2 days ago. Pain now radiates to the shoulder. History of diabetes and hypertension. EXAM: CHEST - 2 VIEW COMPARISON:  12/22/2015 FINDINGS: Heart size and pulmonary vascularity are normal. No airspace disease or consolidation in the lungs. No blunting of costophrenic angles. No pneumothorax. Mediastinal contours appear intact. Calcification of the aorta. Degenerative changes in the spine. IMPRESSION: No evidence of active pulmonary disease.  Aortic atherosclerosis. Electronically Signed   By: Lucienne Capers M.D.   On: 07/20/2017 23:24   Ct Head Wo Contrast  Result Date: 07/20/2017 CLINICAL DATA:  Right foot numbness and pain in the right hand EXAM: CT HEAD WITHOUT CONTRAST TECHNIQUE: Contiguous axial images were obtained from the base of the skull through the vertex without  intravenous contrast. COMPARISON:  02/23/2017 FINDINGS: Brain: No acute territorial infarction, hemorrhage or intracranial mass. 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 Moderate atrophy. Stable ventricle size. Vascular: No hyperdense vessels. Carotid vascular calcification. Vertebral artery calcification. Skull: Normal. Negative for fracture or focal lesion. Sinuses/Orbits: Moderate mucosal thickening in the ethmoids sinuses with mild thickening in the frontal and maxillary sinuses. Other: None. IMPRESSION: 1. No CT evidence for acute intracranial abnormality. 2. Atrophy Electronically Signed   By: Donavan Foil M.D.   On: 07/20/2017 23:37   Mr Angiogram Head Wo Contrast  Result Date: 07/24/2017 CLINICAL DATA:  Weakness in RIGHT leg numbness since Jul 21, 2017. History of hypertension and diabetes. EXAM: MRI HEAD WITHOUT CONTRAST MRA HEAD WITHOUT CONTRAST MRA NECK WITHOUT AND WITH CONTRAST TECHNIQUE: Multiplanar, multiecho pulse sequences of the brain and surrounding structures were obtained without intravenous contrast. Angiographic images of the Circle of Willis were obtained using MRA technique without intravenous contrast. Angiographic images of the neck were obtained using MRA technique without and with intravenous contrast. Carotid stenosis measurements (when applicable) are obtained utilizing NASCET criteria, using the distal internal carotid diameter as the denominator. CONTRAST:  66mL MULTIHANCE GADOBENATE DIMEGLUMINE 529 MG/ML IV SOLN COMPARISON:  None. FINDINGS: MRI HEAD sequences vary from mild to moderately motion degraded. INTRACRANIAL CONTENTS: 5 mm reduced diffusion LEFT superior temporal gyrus with low ADC values. No susceptibility artifact to suggest hemorrhage. RIGHT inferior basal ganglia perivascular space. The ventricles and sulci are normal for patient's age. Faint supratentorial white matter FLAIR T2 hyperintensities compatible with mild chronic small vessel ischemic disease,  less than expected for age. No suspicious parenchymal signal, masses, mass  effect. No abnormal extra-axial fluid collections. No extra-axial masses. 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 SKULL AND UPPER CERVICAL SPINE: No abnormal sellar expansion. No suspicious calvarial bone marrow signal. Craniocervical junction maintained. SINUSES/ORBITS: Moderate paranasal sinusitis. Mastoid air cells are well aerated. Included ocular globes and orbital contents are non-suspicious. OTHER: None. MRA HEAD FINDINGS-moderately motion degraded examination. ANTERIOR CIRCULATION: Normal flow related enhancement of the included cervical, petrous, cavernous and supraclinoid internal carotid arteries. Patent anterior communicating artery. Azygos versus occluded LEFT ACA with moderate tandem stenosis. Patent bilateral middle cerebral arteries with moderate tandem stenosis. No large vessel occlusion, flow limiting stenosis. POSTERIOR CIRCULATION: Loss of LEFT V4 vertebral artery flow related enhancement. Basilar artery is patent, with normal flow related enhancement of the main branch vessels. Patent posterior cerebral arteries. No flow limiting stenosis. ANATOMIC VARIANTS: Hypoplastic LEFT A1 segment. Source images and MIP images were reviewed. MRA NECK FINDINGS-moderate motion degraded examination. ANTERIOR CIRCULATION: The common carotid arteries are widely patent bilaterally. The carotid bifurcations are patent bilaterally without hemodynamically significant stenosis by NASCET criteria. No flow limiting stenosis or luminal irregularity. POSTERIOR CIRCULATION: Bilateral vertebral arteries are patent to the vertebrobasilar junction, moderate tandem. No flow limiting stenosis or luminal irregularity. Source images and MIP images were reviewed. IMPRESSION: MRI HEAD: 1. Mildly motion degraded examination. Subcentimeter acute LEFT temporal lobe infarct. 2. Otherwise noncontrast MRI of the head for age.  MRA HEAD: 1. Moderately motion degraded examination. Occluded LEFT vertebral artery. 2. 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 3. Moderate intracranial atherosclerosis. MRA NECK: 1. No hemodynamically significant stenosis ICA on this motion degraded examination. 2. Moderate tandem stenosis bilateral vertebral arteries. Electronically Signed   By: Elon Alas M.D.   On: 07/24/2017 18:27   Mr Angiogram Neck W Or Wo Contrast  Result Date: 07/24/2017 CLINICAL DATA:  Weakness in RIGHT leg numbness since Jul 21, 2017. History of hypertension and diabetes. EXAM: MRI HEAD WITHOUT CONTRAST MRA HEAD WITHOUT CONTRAST MRA NECK WITHOUT AND WITH CONTRAST TECHNIQUE: Multiplanar, multiecho pulse sequences of the brain and surrounding structures were obtained without intravenous contrast. Angiographic images of the Circle of Willis were obtained using MRA technique without intravenous contrast. Angiographic images of the neck were obtained using MRA technique without and with intravenous contrast. Carotid stenosis measurements (when applicable) are obtained utilizing NASCET criteria, using the distal internal carotid diameter as the denominator. CONTRAST:  91mL MULTIHANCE GADOBENATE DIMEGLUMINE 529 MG/ML IV SOLN COMPARISON:  None. FINDINGS: MRI HEAD sequences vary from mild to moderately motion degraded. INTRACRANIAL CONTENTS: 5 mm reduced diffusion LEFT superior temporal gyrus with low ADC values. No susceptibility artifact to suggest hemorrhage. RIGHT inferior basal ganglia perivascular space. The ventricles and sulci are normal for patient's age. Faint supratentorial white matter FLAIR T2 hyperintensities compatible with mild chronic small vessel ischemic disease, less than expected for age. No suspicious parenchymal signal, masses, mass effect. No abnormal extra-axial fluid collections. No extra-axial masses. 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 SKULL AND UPPER CERVICAL SPINE: No  abnormal sellar expansion. No suspicious calvarial bone marrow signal. Craniocervical junction maintained. SINUSES/ORBITS: Moderate paranasal sinusitis. Mastoid air cells are well aerated. Included ocular globes and orbital contents are non-suspicious. OTHER: None. MRA HEAD FINDINGS-moderately motion degraded examination. ANTERIOR CIRCULATION: Normal flow related enhancement of the included cervical, petrous, cavernous and supraclinoid internal carotid arteries. Patent anterior communicating artery. Azygos versus occluded LEFT ACA with moderate tandem stenosis. Patent bilateral middle cerebral arteries with moderate tandem stenosis. No large vessel occlusion, flow  limiting stenosis. POSTERIOR CIRCULATION: Loss of LEFT V4 vertebral artery flow related enhancement. Basilar artery is patent, with normal flow related enhancement of the main branch vessels. Patent posterior cerebral arteries. No flow limiting stenosis. ANATOMIC VARIANTS: Hypoplastic LEFT A1 segment. Source images and MIP images were reviewed. MRA NECK FINDINGS-moderate motion degraded examination. ANTERIOR CIRCULATION: The common carotid arteries are widely patent bilaterally. The carotid bifurcations are patent bilaterally without hemodynamically significant stenosis by NASCET criteria. No flow limiting stenosis or luminal irregularity. POSTERIOR CIRCULATION: Bilateral vertebral arteries are patent to the vertebrobasilar junction, moderate tandem. No flow limiting stenosis or luminal irregularity. Source images and MIP images were reviewed. IMPRESSION: MRI HEAD: 1. Mildly motion degraded examination. Subcentimeter acute LEFT temporal lobe infarct. 2. Otherwise noncontrast MRI of the head for age. MRA HEAD: 1. Moderately motion degraded examination. Occluded LEFT vertebral artery. 2. 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 3. Moderate intracranial atherosclerosis. MRA NECK: 1. No hemodynamically significant stenosis ICA on this motion degraded examination. 2.  Moderate tandem stenosis bilateral vertebral arteries. Electronically Signed   By: Elon Alas M.D.   On: 07/24/2017 18:27   Mr Brain Wo Contrast  Result Date: 07/24/2017 CLINICAL DATA:  Weakness in RIGHT leg numbness since Jul 21, 2017. History of hypertension and diabetes. EXAM: MRI HEAD WITHOUT CONTRAST MRA HEAD WITHOUT CONTRAST MRA NECK WITHOUT AND WITH CONTRAST TECHNIQUE: Multiplanar, multiecho pulse sequences of the brain and surrounding structures were obtained without intravenous contrast. Angiographic images of the Circle of Willis were obtained using MRA technique without intravenous contrast. Angiographic images of the neck were obtained using MRA technique without and with intravenous contrast. Carotid stenosis measurements (when applicable) are obtained utilizing NASCET criteria, using the distal internal carotid diameter as the denominator. CONTRAST:  34mL MULTIHANCE GADOBENATE DIMEGLUMINE 529 MG/ML IV SOLN COMPARISON:  None. FINDINGS: MRI HEAD sequences vary from mild to moderately motion degraded. INTRACRANIAL CONTENTS: 5 mm reduced diffusion LEFT superior temporal gyrus with low ADC values. No susceptibility artifact to suggest hemorrhage. RIGHT inferior basal ganglia perivascular space. The ventricles and sulci are normal for patient's age. Faint supratentorial white matter FLAIR T2 hyperintensities compatible with mild chronic small vessel ischemic disease, less than expected for age. No suspicious parenchymal signal, masses, mass effect. No abnormal extra-axial fluid collections. No extra-axial masses. 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 SKULL AND UPPER CERVICAL SPINE: No abnormal sellar expansion. No suspicious calvarial bone marrow signal. Craniocervical junction maintained. SINUSES/ORBITS: Moderate paranasal sinusitis. Mastoid air cells are well aerated. Included ocular globes and orbital contents are non-suspicious. OTHER: None. MRA HEAD  FINDINGS-moderately motion degraded examination. ANTERIOR CIRCULATION: Normal flow related enhancement of the included cervical, petrous, cavernous and supraclinoid internal carotid arteries. Patent anterior communicating artery. Azygos versus occluded LEFT ACA with moderate tandem stenosis. Patent bilateral middle cerebral arteries with moderate tandem stenosis. No large vessel occlusion, flow limiting stenosis. POSTERIOR CIRCULATION: Loss of LEFT V4 vertebral artery flow related enhancement. Basilar artery is patent, with normal flow related enhancement of the main branch vessels. Patent posterior cerebral arteries. No flow limiting stenosis. ANATOMIC VARIANTS: Hypoplastic LEFT A1 segment. Source images and MIP images were reviewed. MRA NECK FINDINGS-moderate motion degraded examination. ANTERIOR CIRCULATION: The common carotid arteries are widely patent bilaterally. The carotid bifurcations are patent bilaterally without hemodynamically significant stenosis by NASCET criteria. No flow limiting stenosis or luminal irregularity. POSTERIOR CIRCULATION: Bilateral vertebral arteries are patent to the vertebrobasilar junction, moderate tandem. No flow limiting stenosis or luminal irregularity. Source images and MIP images were  reviewed. IMPRESSION: MRI HEAD: 1. Mildly motion degraded examination. Subcentimeter acute LEFT temporal lobe infarct. 2. Otherwise noncontrast MRI of the head for age. MRA HEAD: 1. Moderately motion degraded examination. Occluded LEFT vertebral artery. 2. 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 3. Moderate intracranial atherosclerosis. MRA NECK: 1. No hemodynamically significant stenosis ICA on this motion degraded examination. 2. Moderate tandem stenosis bilateral vertebral arteries. Electronically Signed   By: Elon Alas M.D.   On: 07/24/2017 18:27       Subjective: Patient was seen and examined 07/26/2017, 2:41 PM Patient stable  Today. No acute distress.  No issues overnight Stable  for discharge.  Discharge Exam:        Vitals:   07/26/17 1100 07/26/17 1105 07/26/17 1120 07/26/17 1159  BP: (!) 174/57 (!) 168/66  (!) 151/70  Pulse: 70   (!) 58  Resp: 12   17  Temp:    98.1 F (36.7 C)  TempSrc:    Oral  SpO2: 92%  94% 98%  Weight:      Height:        General: Pt lying comfortably in bed & appears in no obvious distress. Cardiovascular: S1 & S2 heard, RRR, S1/S2 +. No murmurs, rubs, gallops or clicks. No JVD or pedal edema. Respiratory: Clear to auscultation without wheezing, rhonchi or crackles. No increased work of breathing. Abdominal:  Non distended, non tender & soft. No organomegaly or masses appreciated. Normal bowel sounds heard. CNS: Alert and oriented. No focal deficits. Extremities: no edema, no cyanosis    The results of significant diagnostics from this hospitalization (including imaging, microbiology, ancillary and laboratory) are listed below for reference.     Microbiology: No results found for this or any previous visit (from the past 240 hour(s)).   Labs: CBC: LastLabs  Recent Labs  Lab 07/20/17 2227 07/20/17 2249 07/24/17 1032  WBC 10.1  --  8.3  NEUTROABS 6.4  --   --   HGB 13.3 12.9* 13.9  HCT 40.6 38.0* 42.9  MCV 87.9  --  86.5  PLT 214  --  214     Basic Metabolic Panel: LastLabs       Recent Labs  Lab 07/20/17 2227 07/20/17 2249 07/24/17 1032  NA 139 143 140  K 4.1 4.0 3.9  CL 105 105 107  CO2 25  --  25  GLUCOSE 278* 272* 244*  BUN 23* 22* 14  CREATININE 1.26* 1.20 1.13  CALCIUM 9.4  --  9.2     Liver Function Tests: LastLabs     Recent Labs  Lab 07/20/17 2227  AST 17  ALT 17  ALKPHOS 65  BILITOT 0.5  PROT 6.7  ALBUMIN 3.6     BNP (last 3 results) RecentLabs(withinlast365days)  No results for input(s): BNP in the last 8760 hours.   Cardiac Enzymes: LastLabs      Recent Labs  Lab 07/20/17 2227 07/24/17 1528  CKTOTAL 123  --   CKMB 3.4   --   TROPONINI  --  <0.03     CBG: LastLabs  Recent Labs  Lab 07/25/17 1615 07/25/17 2139 07/26/17 0942 07/26/17 1148 07/26/17 1356  GLUCAP 103* 172* 176* 125* 195*     Hgb A1c RecentLabs(last2labs)  Recent Labs    07/25/17 0655  HGBA1C 8.0*     Lipid Profile RecentLabs(last2labs)     Recent Labs    07/25/17 0655  CHOL 123  HDL 27*  LDLCALC 41  TRIG 273*  CHOLHDL 4.6  Time coordinating discharge: Over 35 minutes  SIGNED: Deatra James, MD, FACP, [facility]. [facility],  Pager [phone]  If 7PM-7AM, please contact night-coverage [email or web address] Ota [facility] 07/26/2017, 2:41 PM           Routing History

## 2017-08-08 ENCOUNTER — Ambulatory Visit (INDEPENDENT_AMBULATORY_CARE_PROVIDER_SITE_OTHER): Payer: Self-pay | Admitting: *Deleted

## 2017-08-08 ENCOUNTER — Telehealth: Payer: Self-pay | Admitting: Cardiology

## 2017-08-08 DIAGNOSIS — I639 Cerebral infarction, unspecified: Secondary | ICD-10-CM

## 2017-08-08 NOTE — Telephone Encounter (Signed)
Attempted to call pt b/c his home monitor has not updated in at least 14 days no answer and unable to leave a message.

## 2017-08-08 NOTE — Progress Notes (Signed)
Wound check appointment. Steri-strips removed. Wound without redness or edema. Incision edges approximated, wound well healed. Loop check in clinic. Battery status: good. R-waves 30mV. 0 symptom episodes, 0 tachy episodes, 0 pause episodes, 0 brady episodes. 0 AF episodes. Monthly summary reports and ROV with GT PRN

## 2017-08-14 ENCOUNTER — Encounter: Payer: Self-pay | Admitting: Cardiology

## 2017-08-27 ENCOUNTER — Encounter: Payer: Self-pay | Admitting: Cardiology

## 2017-08-27 NOTE — Progress Notes (Signed)
[facility] [address] [place]. [place] [address] [phone]       OFFICE FOLLOW UP NOTE  [name] Date of Birth:  December 15, 1940 Medical Record Number:  [id number]   Reason for Referral:  hospital stroke follow up  CHIEF COMPLAINT:  Chief Complaint  Patient presents with  . New Patient (Initial Visit)    hospital follow up. Pt reports that he is doing pretty well. 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 History obtained from patient and chart review. Reviewed all radiology images and labs personally.  [name] is a 77 y.o. male with history of hypertension, diabetes, hyperlipidemia, alcohol use and sinus bradycardia who presented with right arm numbness that started on 07/21/2017.  Patient initially presented to antepartum emergency room with chest pain right-sided numbness/weakness where he had CT head obtained and negative for acute stroke.  Second troponin came back at 0.11 and was told any pain that he be transferred to [facility] but he refused and left the hospital.  On 07/16/2017, daughter called PCP who recommended that he return to hospital and he arrived to [facility]. MRI head reviewed which is motion degraded but did show subcentimeter left temporal lobe infarct.  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  MRA neck reviewed and was also motion degraded but unremarkable.  2D echo showed an EF of 60 to 65% without cardiac source of embolus.  TEE done which is negative for cardiac source of embolus and negative for PFO.  Loop recorder placed on 07/26/2017.  LDL satisfactory 41.  A1c elevated at 8.0 and recommended tight glycemic control with close PCP follow-up.  As patient was not on antithrombotic PTA was recommended DAPT for 3 weeks on  aspirin alone.  Patient was previously on Lipitor 20 mg and this was recommended to resume.  Patient discharged home in stable condition.  Patient is being seen today for hospital follow-up and is accompanied by his daughter.  States his arm numbness completely resolved and is back to doing all previous activities.  Continues to take aspirin only (as 3 weeks of DAPT with aspirin and Plavix completed) without side effects of bleeding or bruising.  Continues to take Lipitor without side effects myalgias.  Blood pressure today mildly elevated at 150/69 but per daughter, SBP typically 130-150 when at other doctor's appointments.  Patient does not monitor this at home but daughter states that she will obtain him a BP machine so he can start doing this.  Loop recorder negative for atrial fibrillation thus far.  On Saturday, patient was bit on the right index finger by his dog after his dog was choking on an object and it upon removing his fingers out of the dog's mouth, dog bit down and punctured 3 areas in his right index finger.  Patient states it continues to swell and he is unable to bend it most likely due to amount of swelling.  Patient has not followed up with PCP in regards to this and is also not tried using ice or Tylenol for swelling.  Highly recommended follow-up with PCP for possible antibiotic prophylaxis or imaging.  Also highly recommended use of Tylenol and ice to decrease the amount of swelling.  Denies new or worsening stroke/TIA symptoms.    ROS:  14 system review of systems performed and negative with exception of eye pain, shortness of breath, joint pain and joint swelling  PMH:  Past Medical History:  Diagnosis Date  . Cancer ([place])    skin cancer  . Diabetes mellitus without complication ([place])   . Hypertension     PSH:  Past Surgical History:  Procedure Laterality Date  . BACK SURGERY    . HERNIA REPAIR    . LOOP RECORDER INSERTION N/A 07/26/2017   Procedure: LOOP RECORDER  INSERTION;  Surgeon: Evans Lance, MD;  Location: [place] [facility];  Service: Cardiovascular;  Laterality: N/A;  . TEE WITHOUT CARDIOVERSION N/A 07/26/2017   Procedure: TRANSESOPHAGEAL ECHOCARDIOGRAM (TEE);  Surgeon: Fay Records, MD;  Location: [facility] ENDOSCOPY;  Service: Cardiovascular;  Laterality: N/A;  . TRIGGER FINGER RELEASE Right 12/02/2015   Procedure: RIGHT THUMB RELEASE TRIGGER FINGER/A-1 PULLEY;  Surgeon: Leanora Cover, MD;  Location: [place];  Service: Orthopedics;  Laterality: Right;    Social History:  Social History   Socioeconomic History  . Marital status: Married    Spouse name: Not on file  . Number of children: Not on file  . Years of education: Not on file  . Highest education level: Not on file  Occupational History  . Not on file  Social Needs  . Financial resource strain: Not on file  . Food insecurity:    Worry: Not on file    Inability: Not on file  . Transportation needs:    Medical: Not on file    Non-medical: Not on file  Tobacco Use  . Smoking status: Never Smoker  . Smokeless tobacco: Never Used  Substance and Sexual Activity  . Alcohol use: Yes    Comment: occ  . Drug use: No  . Sexual activity: Not on file  Lifestyle  . Physical activity:    Days per week: Not on file    Minutes per session: Not on file  . Stress: Not on file  Relationships  . Social connections:    Talks on phone: Not on file    Gets together: Not on file    Attends religious service: Not on file    Active member of club or organization: Not on file    Attends meetings of clubs or organizations: Not on file    Relationship status: Not on file  . Intimate partner violence:    Fear of current or ex partner: Not on file    Emotionally abused: Not on file    Physically abused: Not on file    Forced sexual activity: Not on file  Other Topics Concern  . Not on file  Social History Narrative  . Not on file    Family History:  Family History    Problem Relation Age of Onset  . Cancer Mother   . Cancer Father   . Cancer Brother   . Cancer Other     Medications:   Current Outpatient Medications on File Prior to Visit  Medication Sig Dispense Refill  . amLODipine (NORVASC) 5 MG tablet Take 5 mg by mouth 2 (two) times daily.    Marland Kitchen escitalopram (LEXAPRO) 10 MG tablet Take 10 mg by mouth daily.    Marland Kitchen glipiZIDE (GLUCOTROL) 5 MG tablet Take 5 mg by mouth daily.    . metFORMIN (GLUCOPHAGE) 500 MG tablet Take 1,000 mg by mouth 2 (two) times daily.    Marland Kitchen atorvastatin (LIPITOR) 20 MG tablet Take 2 tablets (  40 mg total) by mouth daily. 60 tablet 0   No current facility-administered medications on file prior to visit.     Allergies:  No Known Allergies   Physical Exam  Vitals:   08/28/17 1019  BP: (!) 150/69  Pulse: (!) 46  Weight: 197 lb 9.6 oz (89.6 kg)  Height: 6' (1.829 m)   Body mass index is 26.8 kg/m. No exam data present  General: well developed, well nourished, pleasant elderly Caucasian male, seated, in no evident distress Head: head normocephalic and atraumatic.   Neck: supple with no carotid or supraclavicular bruits Cardiovascular: regular rate and rhythm, no murmurs Musculoskeletal: Swelling right index finger -negative for signs of infection such as warmth or redness -decreased range of motion possibly due to swelling and/or pain Skin:  no rash/petichiae Vascular:  Normal pulses all extremities  Neurologic Exam Mental Status: Awake and fully alert. Oriented to place and time. Recent and remote memory intact. Attention span, concentration and fund of knowledge appropriate. Mood and affect appropriate.  Cranial Nerves: Fundoscopic exam reveals sharp disc margins. Pupils equal, briskly reactive to light. Extraocular movements full without nystagmus. Visual fields full to confrontation. Hearing intact. Facial sensation intact. Face, tongue, palate moves normally and symmetrically.  Motor: Normal bulk and tone.  Normal strength in all tested extremity muscles. Sensory.: intact to touch , pinprick , position and vibratory sensation.  Coordination: Rapid alternating movements normal in all extremities. Finger-to-nose and heel-to-shin performed accurately bilaterally. Gait and Station: Arises from chair without difficulty. Stance is normal. Gait demonstrates normal stride length and balance . Able to heel, toe and tandem walk without difficulty.  Reflexes: 1+ and symmetric. Toes downgoing.    NIHSS  0 Modified Rankin  0  Diagnostic Data (Labs, Imaging, Testing)  CT HEAD WO CONTRAST 07/20/2017 IMPRESSION: 1. No CT evidence for acute intracranial abnormality. 2. Atrophy  MR BRAIN WO CONTRAST MR MRA HEAD WO CONTRAST MR MRA NECK W WO CONTRAST 07/24/2017 IMPRESSION: MRI HEAD: 1. Mildly motion degraded examination. Subcentimeter acute LEFT temporal lobe infarct. 2. Otherwise noncontrast MRI of the head for age. MRA HEAD: 1. Moderately motion degraded examination. Occluded LEFT vertebral artery. 2. 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 3. Moderate intracranial atherosclerosis. MRA NECK: 1. No hemodynamically significant stenosis ICA on this motion degraded examination. 2. Moderate tandem stenosis bilateral vertebral arteries.  ECHOCARDIOGRAM 07/25/2017 Study Conclusions - Left ventricle: The cavity size was normal. Wall thickness was   increased in a pattern of moderate LVH. Systolic function was   normal. The estimated ejection fraction was in the range of 60%   to 65%. Wall motion was normal; there were no regional wall   motion abnormalities. Doppler parameters are consistent with   abnormal left ventricular relaxation (grade 1 diastolic   dysfunction). The E/e&' ratio is between 8-15, suggesting   indeterminate LV filling pressure. - Left atrium: The atrium was mildly dilated. 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 - Inferior vena cava: The vessel was normal in size.  The   respirophasic diameter changes were in the normal range (>= 50%),   consistent with normal central venous pressure. Impressions: - LVEF 60-65%, moderate LVH, normal wall motion, grade 1 DD,   indeterminate LV filling pressure, mild LAE, aneurysmal IAS - PFO   cannot be excluded. Recommend bubble study or TEE bubble study,   normal IVC.    ASSESSMENT: Patrick Phillips is a 77 y.o. 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 Vascular risk factors include HTN and HLD.     PLAN: -Continue aspirin 81 mg daily  and Lipitor for secondary stroke prevention -F/u with PCP regarding your HLD, HTN and DM management -Continue to monitor loop recorder to rule out atrial fibrillation -continue to monitor BP at home -Advised patient to follow-up with PCP regarding dog bite and in the meantime use Tylenol and ice to help decrease swelling -Advised patient to continue to stay active and eat a healthy diet -Maintain strict control of hypertension with blood pressure goal below 130/90, diabetes with hemoglobin A1c goal below 6.5% and cholesterol with LDL cholesterol (bad cholesterol) goal below 70 mg/dL. I also advised the patient to eat a healthy diet with plenty of whole grains, cereals, fruits and vegetables, exercise regularly and maintain ideal body weight.  Follow up in 3 months or call earlier if needed   Greater than 50% of time during this 25 minute visit was spent on counseling,explanation of diagnosis of left temporal lobe infarct, reviewing risk factor management of HLD, HTN and DM, planning of further management, discussion with patient and family and coordination of care    Venancio Poisson, [facility]  [facility] [address] [place], [place] [address]  Phone [phone] Fax [phone]

## 2017-08-28 ENCOUNTER — Ambulatory Visit (INDEPENDENT_AMBULATORY_CARE_PROVIDER_SITE_OTHER): Payer: Medicare Other | Admitting: Adult Health

## 2017-08-28 ENCOUNTER — Encounter: Payer: Self-pay | Admitting: Adult Health

## 2017-08-28 VITALS — BP 150/69 | HR 46 | Ht 72.0 in | Wt 197.6 lb

## 2017-08-28 DIAGNOSIS — I10 Essential (primary) hypertension: Secondary | ICD-10-CM

## 2017-08-28 DIAGNOSIS — I639 Cerebral infarction, unspecified: Secondary | ICD-10-CM | POA: Diagnosis not present

## 2017-08-28 DIAGNOSIS — E119 Type 2 diabetes mellitus without complications: Secondary | ICD-10-CM | POA: Diagnosis not present

## 2017-08-28 NOTE — Patient Instructions (Signed)
Continue aspirin 81 mg daily  and lipitor  for secondary stroke prevention  Follow up with your PCP for your dog bite - for the time being, use tylenol as needed and ice to help decrease swelling  Continue to follow up with PCP regarding cholesterol, blood pressure and diabetes management   Continue to monitor blood pressure at home  Maintain strict control of hypertension with blood pressure goal below 130/90, diabetes with hemoglobin A1c goal below 6.5% and cholesterol with LDL cholesterol (bad cholesterol) goal below 70 mg/dL. I also advised the patient to eat a healthy diet with plenty of whole grains, cereals, fruits and vegetables, exercise regularly and maintain ideal body weight.  Followup in the future with me in 3 months or call earlier if needed       Thank you for coming to see [place] at [facility]. I hope we have been able to provide you high quality care today.  You may receive a patient satisfaction survey over the next few weeks. We would appreciate your feedback and comments so that we may continue to improve ourselves and the health of our patients.

## 2017-09-03 NOTE — Progress Notes (Signed)
I agree with the above plan 

## 2017-09-06 ENCOUNTER — Ambulatory Visit (INDEPENDENT_AMBULATORY_CARE_PROVIDER_SITE_OTHER): Payer: Medicare Other | Admitting: *Deleted

## 2017-09-06 DIAGNOSIS — I639 Cerebral infarction, unspecified: Secondary | ICD-10-CM

## 2017-09-06 LAB — CUP PACEART INCLINIC DEVICE CHECK
Date Time Interrogation Session: 20190711133702
MDC IDC PG IMPLANT DT: 20190530

## 2017-09-06 NOTE — Progress Notes (Signed)
Loop check in clinic due to disconnected monitor. Battery status: good. R-waves 0.85mV. No symptom, tachy, or AF episodes. Pause and brady detection off since implant, but turned on today due to hx of syncope. Pause detection set to 4.5sec, brady detection set to 30bpm for 12 beats. Patient has no cell signal at home, so will plan to transmit manually from daughter's house at least weekly. Instructions given to patient and daughter. Monthly summary reports and ROV with GT PRN.

## 2017-09-10 ENCOUNTER — Telehealth: Payer: Self-pay | Admitting: *Deleted

## 2017-09-10 NOTE — Telephone Encounter (Signed)
LMOVM for Patrick Phillips, patient's daughter (DPR), advising that patient's manual Carelink transmissions have been successful.  No alerts or abnormalities noted.  Requested at least one transmission weekly as patient has poor cell signal at home.  [place] phone number for questions/concerns.

## 2017-10-10 ENCOUNTER — Emergency Department (HOSPITAL_COMMUNITY)
Admission: EM | Admit: 2017-10-10 | Discharge: 2017-10-10 | Disposition: A | Discharge status: Home / Self Care | Payer: Medicare Other | Attending: Emergency Medicine | Admitting: Emergency Medicine

## 2017-10-10 ENCOUNTER — Encounter (HOSPITAL_COMMUNITY): Payer: Self-pay | Admitting: Emergency Medicine

## 2017-10-10 ENCOUNTER — Other Ambulatory Visit: Payer: Self-pay

## 2017-10-10 ENCOUNTER — Emergency Department (HOSPITAL_COMMUNITY): Payer: Medicare Other

## 2017-10-10 DIAGNOSIS — I10 Essential (primary) hypertension: Secondary | ICD-10-CM | POA: Insufficient documentation

## 2017-10-10 DIAGNOSIS — R05 Cough: Secondary | ICD-10-CM | POA: Insufficient documentation

## 2017-10-10 DIAGNOSIS — Z7984 Long term (current) use of oral hypoglycemic drugs: Secondary | ICD-10-CM | POA: Diagnosis not present

## 2017-10-10 DIAGNOSIS — Z79899 Other long term (current) drug therapy: Secondary | ICD-10-CM | POA: Insufficient documentation

## 2017-10-10 DIAGNOSIS — E119 Type 2 diabetes mellitus without complications: Secondary | ICD-10-CM | POA: Diagnosis not present

## 2017-10-10 DIAGNOSIS — R059 Cough, unspecified: Secondary | ICD-10-CM

## 2017-10-10 DIAGNOSIS — R0602 Shortness of breath: Secondary | ICD-10-CM | POA: Insufficient documentation

## 2017-10-10 DIAGNOSIS — R001 Bradycardia, unspecified: Secondary | ICD-10-CM | POA: Insufficient documentation

## 2017-10-10 DIAGNOSIS — Z7982 Long term (current) use of aspirin: Secondary | ICD-10-CM | POA: Diagnosis not present

## 2017-10-10 DIAGNOSIS — Z8673 Personal history of transient ischemic attack (TIA), and cerebral infarction without residual deficits: Secondary | ICD-10-CM | POA: Diagnosis not present

## 2017-10-10 LAB — COMPREHENSIVE METABOLIC PANEL
ALBUMIN: 3.6 g/dL (ref 3.5–5.0)
ALT: 20 U/L (ref 0–44)
ANION GAP: 7 (ref 5–15)
AST: 19 U/L (ref 15–41)
Alkaline Phosphatase: 55 U/L (ref 38–126)
BUN: 22 mg/dL (ref 8–23)
CO2: 25 mmol/L (ref 22–32)
Calcium: 9.2 mg/dL (ref 8.9–10.3)
Chloride: 109 mmol/L (ref 98–111)
Creatinine, Ser: 1.08 mg/dL (ref 0.61–1.24)
GFR calc Af Amer: >60 mL/min (ref >60)
GFR calc non Af Amer: >60 mL/min (ref >60)
GLUCOSE: 107 mg/dL — AB (ref 70–99)
POTASSIUM: 4 mmol/L (ref 3.5–5.1)
SODIUM: 141 mmol/L (ref 135–145)
TOTAL PROTEIN: 6.3 g/dL — AB (ref 6.5–8.1)
Total Bilirubin: 0.7 mg/dL (ref 0.3–1.2)

## 2017-10-10 LAB — CBC WITH DIFFERENTIAL/PLATELET
BASOS PCT: 0 %
Basophils Absolute: 0 10*3/uL (ref 0.0–0.1)
EOS ABS: 0.4 10*3/uL (ref 0.0–0.7)
Eosinophils Relative: 4 %
HCT: 38.9 % — ABNORMAL LOW (ref 39.0–52.0)
Hemoglobin: 12.8 g/dL — ABNORMAL LOW (ref 13.0–17.0)
Lymphocytes Relative: 36 %
Lymphs Abs: 3.5 10*3/uL (ref 0.7–4.0)
MCH: 29.4 pg (ref 26.0–34.0)
MCHC: 32.9 g/dL (ref 30.0–36.0)
MCV: 89.4 fL (ref 78.0–100.0)
MONO ABS: 0.7 10*3/uL (ref 0.1–1.0)
MONOS PCT: 7 %
Neutro Abs: 5 10*3/uL (ref 1.7–7.7)
Neutrophils Relative %: 53 %
Platelets: 202 10*3/uL (ref 150–400)
RBC: 4.35 MIL/uL (ref 4.22–5.81)
RDW: 13.1 % (ref 11.5–15.5)
WBC: 9.5 10*3/uL (ref 4.0–10.5)

## 2017-10-10 LAB — TROPONIN I: Troponin I: <0.03 ng/mL (ref <0.03)

## 2017-10-10 LAB — BRAIN NATRIURETIC PEPTIDE: B Natriuretic Peptide: 25 pg/mL (ref 0.0–100.0)

## 2017-10-10 NOTE — Discharge Instructions (Addendum)
Tests showed no life-threatening condition.  Follow-up your primary care doctor. °

## 2017-10-10 NOTE — ED Triage Notes (Signed)
Pt C/O shortness of breath that began earlier today. Pt states he feels like there is something "wrapped around his neck."

## 2017-10-11 NOTE — ED Provider Notes (Addendum)
[facility] EMERGENCY DEPARTMENT Provider Note   CSN: [id number] Arrival date & time: 10/10/17  1948     History   Chief Complaint Chief Complaint  Patient presents with  . Shortness of Breath    HPI Normal Patrick Phillips is a 77 y.o. male.  Patient presents with cough with clear phlegm earlier today.  No substernal chest pain, dyspnea, fever, sweats, chills, rusty sputum.  He states "he is normal."  Nursing notes comment on a feeling of something wrapped around his neck, but he did not concur with this.  Past medical history includes stroke, diabetes, hypertension.  Severity of symptoms is mild.  Nothing makes symptoms better or worse.     Past Medical History:  Diagnosis Date  . Cancer ([place])    skin cancer  . Diabetes mellitus without complication ([place])   . Hypertension     Patient Active Problem List   Diagnosis Date Noted  . Acute ischemic stroke ([place]) 07/24/2017  . Chest pain 12/22/2015  . Chronic back pain 12/22/2015  . Syncope 10/17/2013  . Sinus bradycardia 10/17/2013  . SOB (shortness of breath) 10/17/2013  . Hypertension   . Diabetes mellitus without complication [facility])     Past Surgical History:  Procedure Laterality Date  . BACK SURGERY    . HERNIA REPAIR    . LOOP RECORDER INSERTION N/A 07/26/2017   Procedure: LOOP RECORDER INSERTION;  Surgeon: Evans Lance, MD;  Location: [place] CV [facility];  Service: Cardiovascular;  Laterality: N/A;  . TEE WITHOUT CARDIOVERSION N/A 07/26/2017   Procedure: TRANSESOPHAGEAL ECHOCARDIOGRAM (TEE);  Surgeon: Fay Records, MD;  Location: [facility] ENDOSCOPY;  Service: Cardiovascular;  Laterality: N/A;  . TRIGGER FINGER RELEASE Right 12/02/2015   Procedure: RIGHT THUMB RELEASE TRIGGER FINGER/A-1 PULLEY;  Surgeon: Leanora Cover, MD;  Location: [place];  Service: Orthopedics;  Laterality: Right;        Home Medications    Prior to Admission medications   Medication Sig Start Date End Date Taking? Authorizing Provider   amLODipine (NORVASC) 5 MG tablet Take 5 mg by mouth 2 (two) times daily. 05/30/17  Yes [provider]  aspirin EC 81 MG tablet Take 81 mg by mouth daily.   Yes [provider]  atorvastatin (LIPITOR) 20 MG tablet Take 2 tablets (40 mg total) by mouth daily. 07/26/17 10/10/17 Yes Shahmehdi, Seyed A, MD  escitalopram (LEXAPRO) 10 MG tablet Take 10 mg by mouth at bedtime.  07/11/17  Yes [provider]  glipiZIDE (GLUCOTROL) 5 MG tablet Take 5 mg by mouth daily. 07/11/17  Yes [provider]  metFORMIN (GLUCOPHAGE) 1000 MG tablet Take 1,000 mg by mouth 2 (two) times daily.  07/31/13  Yes [provider]    Family History Family History  Problem Relation Age of Onset  . Cancer Mother   . Cancer Father   . Cancer Brother   . Cancer Other     Social History Social History   Tobacco Use  . Smoking status: Never Smoker  . Smokeless tobacco: Never Used  Substance Use Topics  . Alcohol use: Yes    Comment: occ  . Drug use: No     Allergies   Patient has no known allergies.   Review of Systems Review of Systems  All other systems reviewed and are negative.    Physical Exam Updated Vital Signs BP (!) 154/74   Pulse (!) 47   Temp 97.7 F (36.5 C) (Oral)   Resp 16  SpO2 98%   Physical Exam  Constitutional: He is oriented to person, place, and time. He appears well-developed and well-nourished.  HENT:  Head: Normocephalic and atraumatic.  Eyes: Conjunctivae are normal.  Neck: Neck supple.  Cardiovascular: Normal rate and regular rhythm.  Pulmonary/Chest: Effort normal and breath sounds normal.  Abdominal: Soft. Bowel sounds are normal.  Musculoskeletal: Normal range of motion.  Neurological: He is alert and oriented to person, place, and time.  Skin: Skin is warm and dry.  Psychiatric: He has a normal mood and affect. His behavior is normal.  Nursing note and vitals reviewed.    ED Treatments / Results  Labs (all labs  ordered are listed, but only abnormal results are displayed) Labs Reviewed  CBC WITH DIFFERENTIAL/PLATELET - Abnormal; Notable for the following components:      Result Value   Hemoglobin 12.8 (*)    HCT 38.9 (*)    All other components within normal limits  COMPREHENSIVE METABOLIC PANEL - Abnormal; Notable for the following components:   Glucose, Bld 107 (*)    Total Protein 6.3 (*)    All other components within normal limits  TROPONIN I  BRAIN NATRIURETIC PEPTIDE    EKG EKG Interpretation  Date/Time:  Wednesday October 10 2017 19:59:16 EDT Ventricular Rate:  50 PR Interval:    QRS Duration: 88 QT Interval:  424 QTC Calculation: 386 R Axis:   -14 Text Interpretation:  Junctional rhythm Moderate voltage criteria for LVH, may be normal variant Abnormal ECG Confirmed by Nat Christen [phone]) on 10/10/2017 10:00:49 PM Also confirmed by Nat Christen [phone])  on 10/10/2017 11:32:30 PM   Radiology Dg Chest 2 View  Result Date: 10/10/2017 CLINICAL DATA:  Patient states SOB since earlier today and weakness. He feels like he is going to pass out. Hx of diabetes, HTN, and recent loop recorder insertion 07/26/2017. EXAM: CHEST - 2 VIEW COMPARISON:  07/24/2017 FINDINGS: Lungs are clear. Heart size upper limits normal. Event monitor projects in the anterior chest wall over the left lower lung. No effusion. Anterior vertebral endplate spurring at multiple levels in the lower thoracic spine. IMPRESSION: Stable borderline cardiomegaly.  No acute disease. Electronically Signed   By: Lucrezia Europe M.D.   On: 10/10/2017 20:16    Procedures Procedures (including critical care time)  Medications Ordered in ED Medications - No data to display   Initial Impression / Assessment and Plan / ED Course  I have reviewed the triage vital signs and the nursing notes.  Pertinent labs & imaging results that were available during my care of the patient were reviewed by me and considered in my medical decision  making (see chart for details).     Patient appears in no acute distress.  History and physical not consistent with an anginal spell.  Basic labs, troponin, EKG, chest x-ray showed no acute findings.  He has been bradycardic in the past.  I disagree with computer reading.  EKG reveals sinus bradycardia.  Patient prefers to be treated as outpatient.  Final Clinical Impressions(s) / ED Diagnoses   Final diagnoses:  Cough    ED Discharge Orders    None       Nat Christen, MD 10/11/17 [place]    Nat Christen, MD 10/11/17 [phone]

## 2017-10-29 ENCOUNTER — Emergency Department (HOSPITAL_COMMUNITY): Payer: Medicare Other

## 2017-10-29 ENCOUNTER — Other Ambulatory Visit: Payer: Self-pay

## 2017-10-29 ENCOUNTER — Emergency Department (HOSPITAL_COMMUNITY)
Admission: EM | Admit: 2017-10-29 | Discharge: 2017-10-30 | Disposition: A | Discharge status: Home / Self Care | Payer: Medicare Other | Attending: Emergency Medicine | Admitting: Emergency Medicine

## 2017-10-29 ENCOUNTER — Encounter (HOSPITAL_COMMUNITY): Payer: Self-pay | Admitting: Emergency Medicine

## 2017-10-29 DIAGNOSIS — S299XXA Unspecified injury of thorax, initial encounter: Secondary | ICD-10-CM | POA: Diagnosis present

## 2017-10-29 DIAGNOSIS — Z7984 Long term (current) use of oral hypoglycemic drugs: Secondary | ICD-10-CM | POA: Insufficient documentation

## 2017-10-29 DIAGNOSIS — Y92002 Bathroom of unspecified non-institutional (private) residence single-family (private) house as the place of occurrence of the external cause: Secondary | ICD-10-CM | POA: Insufficient documentation

## 2017-10-29 DIAGNOSIS — Z7982 Long term (current) use of aspirin: Secondary | ICD-10-CM | POA: Insufficient documentation

## 2017-10-29 DIAGNOSIS — Y998 Other external cause status: Secondary | ICD-10-CM | POA: Diagnosis not present

## 2017-10-29 DIAGNOSIS — E119 Type 2 diabetes mellitus without complications: Secondary | ICD-10-CM | POA: Diagnosis not present

## 2017-10-29 DIAGNOSIS — I10 Essential (primary) hypertension: Secondary | ICD-10-CM | POA: Insufficient documentation

## 2017-10-29 DIAGNOSIS — Z79899 Other long term (current) drug therapy: Secondary | ICD-10-CM | POA: Diagnosis not present

## 2017-10-29 DIAGNOSIS — Y93E1 Activity, personal bathing and showering: Secondary | ICD-10-CM | POA: Diagnosis not present

## 2017-10-29 DIAGNOSIS — W182XXA Fall in (into) shower or empty bathtub, initial encounter: Secondary | ICD-10-CM | POA: Diagnosis not present

## 2017-10-29 DIAGNOSIS — R109 Unspecified abdominal pain: Secondary | ICD-10-CM | POA: Insufficient documentation

## 2017-10-29 DIAGNOSIS — S20211A Contusion of right front wall of thorax, initial encounter: Secondary | ICD-10-CM | POA: Diagnosis not present

## 2017-10-29 DIAGNOSIS — R911 Solitary pulmonary nodule: Secondary | ICD-10-CM | POA: Diagnosis not present

## 2017-10-29 LAB — POCT I-STAT, CHEM 8
BUN: 24 mg/dL — ABNORMAL HIGH (ref 8–23)
CHLORIDE: 108 mmol/L (ref 98–111)
Calcium, Ion: 1.25 mmol/L (ref 1.15–1.40)
Creatinine, Ser: 1.1 mg/dL (ref 0.61–1.24)
Glucose, Bld: 142 mg/dL — ABNORMAL HIGH (ref 70–99)
HEMATOCRIT: 37 % — AB (ref 39.0–52.0)
HEMOGLOBIN: 12.6 g/dL — AB (ref 13.0–17.0)
Potassium: 4.3 mmol/L (ref 3.5–5.1)
SODIUM: 142 mmol/L (ref 135–145)
TCO2: 25 mmol/L (ref 22–32)

## 2017-10-29 MED ORDER — HYDROCODONE-ACETAMINOPHEN 5-325 MG PO TABS
1.0000 | ORAL_TABLET | Freq: Once | ORAL | Status: AC
  Administered 2017-10-29: 1 via ORAL
  Filled 2017-10-29: qty 1

## 2017-10-29 NOTE — ED Triage Notes (Signed)
Patient states he was taking a shower and slipped falling in the bathtub. Complaining of pain to right ribs. Also states he hit the right side of his head and the back of his head. States he blacked out "for just a little bit."

## 2017-10-29 NOTE — ED Provider Notes (Signed)
[facility] EMERGENCY DEPARTMENT Provider Note   CSN: [id number] Arrival date & time: 10/29/17  2108     History   Chief Complaint Chief Complaint  Patient presents with  . Fall    HPI Patrick Phillips is a 77 y.o. male.  Patient presents with right rib and abdominal pain after slipping while he was in the shower.  States he slipped on a piece of soap and fell striking his right ribs on the edge of the tub in his head on the commode.  Did not lose consciousness.  Apparently did tell the triage nurse though that he did get knocked out though.  Denies any headache.  Complains of pain to his right ribs and pain with breathing.  Also has diffuse abdominal pain to palpation.  Does not take any blood thinners.  Denies any neck or back pain.  No focal weakness, numbness or tingling.  Denies any preceding dizziness or lightheadedness.  Denies syncope.  The history is provided by the patient.  Fall  Associated symptoms include chest pain and abdominal pain. Pertinent negatives include no headaches and no shortness of breath.    Past Medical History:  Diagnosis Date  . Cancer ([place])    skin cancer  . Diabetes mellitus without complication ([place])   . Hypertension     Patient Active Problem List   Diagnosis Date Noted  . Acute ischemic stroke ([place]) 07/24/2017  . Chest pain 12/22/2015  . Chronic back pain 12/22/2015  . Syncope 10/17/2013  . Sinus bradycardia 10/17/2013  . SOB (shortness of breath) 10/17/2013  . Hypertension   . Diabetes mellitus without complication [facility])     Past Surgical History:  Procedure Laterality Date  . BACK SURGERY    . HERNIA REPAIR    . LOOP RECORDER INSERTION N/A 07/26/2017   Procedure: LOOP RECORDER INSERTION;  Surgeon: Evans Lance, MD;  Location: [facility];  Service: Cardiovascular;  Laterality: N/A;  . TEE WITHOUT CARDIOVERSION N/A 07/26/2017   Procedure: TRANSESOPHAGEAL ECHOCARDIOGRAM (TEE);  Surgeon: Fay Records, MD;  Location: [facility]  ENDOSCOPY;  Service: Cardiovascular;  Laterality: N/A;  . TRIGGER FINGER RELEASE Right 12/02/2015   Procedure: RIGHT THUMB RELEASE TRIGGER FINGER/A-1 PULLEY;  Surgeon: Leanora Cover, MD;  Location: [facility];  Service: Orthopedics;  Laterality: Right;        Home Medications    Prior to Admission medications   Medication Sig Start Date End Date Taking? Authorizing Provider  amLODipine (NORVASC) 5 MG tablet Take 5 mg by mouth 2 (two) times daily. 05/30/17   [provider]  aspirin EC 81 MG tablet Take 81 mg by mouth daily.    [provider]  atorvastatin (LIPITOR) 20 MG tablet Take 2 tablets (40 mg total) by mouth daily. 07/26/17 10/10/17  Shahmehdi, Valeria Batman, MD  escitalopram (LEXAPRO) 10 MG tablet Take 10 mg by mouth at bedtime.  07/11/17   [provider]  glipiZIDE (GLUCOTROL) 5 MG tablet Take 5 mg by mouth daily. 07/11/17   [provider]  metFORMIN (GLUCOPHAGE) 1000 MG tablet Take 1,000 mg by mouth 2 (two) times daily.  07/31/13   [provider]    Family History Family History  Problem Relation Age of Onset  . Cancer Mother   . Cancer Father   . Cancer Brother   . Cancer Other     Social History Social History   Tobacco Use  . Smoking status: Never Smoker  . Smokeless tobacco:  Never Used  Substance Use Topics  . Alcohol use: Yes    Comment: occ  . Drug use: No     Allergies   Patient has no known allergies.   Review of Systems Review of Systems  Constitutional: Negative for activity change, appetite change and fever.  Eyes: Negative for visual disturbance.  Respiratory: Negative for cough, chest tightness and shortness of breath.   Cardiovascular: Positive for chest pain.  Gastrointestinal: Positive for abdominal pain. Negative for nausea and vomiting.  Genitourinary: Negative for dysuria and hematuria.  Musculoskeletal: Positive for arthralgias and myalgias.  Neurological: Negative for dizziness,  weakness and headaches.   all other systems are negative except as noted in the HPI and PMH.     Physical Exam Updated Vital Signs BP (!) 163/85 (BP Location: Right Arm)   Pulse (!) 55   Temp 97.8 F (36.6 C) (Oral)   Resp 16   Ht 6' (1.829 m)   Wt 91.6 kg   SpO2 99%   BMI 27.40 kg/m   Physical Exam  Constitutional: He is oriented to person, place, and time. He appears well-developed and well-nourished. No distress.  HENT:  Head: Normocephalic and atraumatic.  Mouth/Throat: Oropharynx is clear and moist. No oropharyngeal exudate.  Eyes: Pupils are equal, round, and reactive to light. Conjunctivae and EOM are normal.  Neck: Normal range of motion. Neck supple.  No C-spine tenderness  Cardiovascular: Normal rate, regular rhythm, normal heart sounds and intact distal pulses.  No murmur heard. Pulmonary/Chest: Effort normal and breath sounds normal. No respiratory distress. He exhibits tenderness.  Right rib tenderness, no crepitance or ecchymosis  Abdominal: Soft. There is tenderness. There is no rebound and no guarding.  Distended abdomen, diffusely tender to palpation.  Musculoskeletal: Normal range of motion. He exhibits no edema or tenderness.  No T or L-spine tenderness  Full range of motion of hips without pain  Neurological: He is alert and oriented to person, place, and time. No cranial nerve deficit. He exhibits normal muscle tone. Coordination normal.  No ataxia on finger to nose bilaterally. No pronator drift. 5/5 strength throughout. CN 2-12 intact.Equal grip strength. Sensation intact.   Skin: Skin is warm.  Psychiatric: He has a normal mood and affect. His behavior is normal.  Nursing note and vitals reviewed.    ED Treatments / Results  Labs (all labs ordered are listed, but only abnormal results are displayed) Labs Reviewed  POCT I-STAT, CHEM 8 - Abnormal; Notable for the following components:      Result Value   BUN 24 (*)    Glucose, Bld 142 (*)     Hemoglobin 12.6 (*)    HCT 37.0 (*)    All other components within normal limits  I-STAT CHEM 8, ED    EKG EKG Interpretation  Date/Time:  Monday October 29 2017 23:24:00 EDT Ventricular Rate:  48 PR Interval:    QRS Duration: 100 QT Interval:  470 QTC Calculation: 420 R Axis:   33 Text Interpretation:  Sinus bradycardia Probable anteroseptal infarct, old No significant change was found Confirmed by Ezequiel Essex [phone]) on 10/29/2017 11:45:40 PM   Radiology Dg Ribs Unilateral W/chest Right  Result Date: 10/29/2017 CLINICAL DATA:  Pain after fall. EXAM: RIGHT RIBS AND CHEST - 3+ VIEW COMPARISON:  Chest x-ray October 10, 2017 FINDINGS: No fracture or other bone lesions are seen involving the ribs. There is no evidence of pneumothorax or pleural effusion. Both lungs are clear. Heart size and mediastinal  contours are within normal limits. IMPRESSION: Negative. Electronically Signed   By: Dorise Bullion III M.D   On: 10/29/2017 21:49   Ct Head Wo Contrast  Result Date: 10/30/2017 CLINICAL DATA:  77 year old male with trauma to the head. EXAM: CT HEAD WITHOUT CONTRAST TECHNIQUE: Contiguous axial images were obtained from the base of the skull through the vertex without intravenous contrast. COMPARISON:  Brain MRI dated 07/24/2017 FINDINGS: Brain: There is mild age-related atrophy. Minimal chronic microvascular ischemic changes noted. Right basal ganglia small old lacunar infarct as seen on the prior MRI. There is no acute intracranial hemorrhage. No mass effect or midline shift. No extra-axial fluid collection. Vascular: No hyperdense vessel or unexpected calcification. Skull: Normal. Negative for fracture or focal lesion. Sinuses/Orbits: Diffuse mucoperiosteal thickening of paranasal sinuses with opacification of multiple ethmoid air cells. No air-fluid level. The mastoid air cells are clear. Other: None IMPRESSION: 1. No acute intracranial hemorrhage. 2. Minimal age-related atrophy and  chronic microvascular ischemic changes and small old right basal ganglia lacunar infarct. Electronically Signed   By: Anner Crete M.D.   On: 10/30/2017 00:54   Ct Chest W Contrast  Result Date: 10/30/2017 CLINICAL DATA:  Slipped and fell in bathtub. RIGHT rib pain. Brief loss of consciousness. History of skin cancer, hypertension, hernia repair and back surgery. EXAM: CT CHEST, ABDOMEN, AND PELVIS WITH CONTRAST TECHNIQUE: Multidetector CT imaging of the chest, abdomen and pelvis was performed following the standard protocol during bolus administration of intravenous contrast. CONTRAST:  179mL ISOVUE-300 IOPAMIDOL (ISOVUE-300) INJECTION 61% COMPARISON:  CT abdomen March 05, 2012 FINDINGS: CT CHEST FINDINGS CARDIOVASCULAR: Heart size is borderline enlarged. Mild coronary artery calcification. No pericardial effusions. Thoracic aorta is normal course and caliber, mild calcific atherosclerosis. MEDIASTINUM/NODES: No mediastinal mass. No lymphadenopathy by CT size criteria. Normal appearance of thoracic esophagus though not tailored for evaluation. LUNGS/PLEURA: Tracheobronchial tree is patent, no pneumothorax. No pleural effusion or focal consolidation. Bilobed RIGHT lower lobe 12 mm pulmonary nodule (series 3, image 86), 4 mm subpleural RIGHT upper lobe pulmonary nodule, 4 mm RIGHT upper lobe subsolid pulmonary nodule. Multiple LEFT pulmonary nodules measuring to 7 mm (series 3, image 114). The nodules are new or increased in size from prior CT. MUSCULOSKELETAL: Non-acute.  Loop moderate LEFT anterior chest wall. CT ABDOMEN AND PELVIS FINDINGS HEPATOBILIARY: Liver and gallbladder are nonacute. 12 mm fast filling hemangioma (series 2, image 55). PANCREAS: Normal. SPLEEN: Normal. ADRENALS/URINARY TRACT: Kidneys are orthotopic, demonstrating symmetric enhancement. No nephrolithiasis, hydronephrosis or solid renal masses. Bilateral renal cysts measuring to 3.6 cm on the RIGHT. The unopacified ureters are normal in  course and caliber. Delayed imaging through the kidneys demonstrates symmetric prompt contrast excretion within the proximal urinary collecting system. Urinary bladder is partially distended and unremarkable. Normal adrenal glands. STOMACH/BOWEL: Small hiatal hernia. The stomach, small and large bowel are normal in course and caliber without inflammatory changes. Moderate amount of retained large bowel stool. Mild colonic diverticulosis. Normal appendix. VASCULAR/LYMPHATIC: Aortoiliac vessels are normal in course and caliber. Mild intimal thickening calcific atherosclerosis. Similar misty mesentery with similar mesenteric lymphadenopathy measuring to 15 mm short axis. REPRODUCTIVE: Prostatomegaly invading base of bladder OTHER: No intraperitoneal free fluid or free air. MUSCULOSKELETAL: Non-acute. Moderate to large LEFT fat containing inguinal hernia. Status post RIGHT L4-5 and L5-S1 laminectomies. Moderate to severe L5-S1 degenerative disc. IMPRESSION: CT CHEST: 1. No CT findings of acute trauma. No acute cardiopulmonary process. 2. **An incidental finding of potential clinical significance has been found. Multiple pulmonary nodules measuring  to 12 mm. Non-contrast chest CT at 3-6 months is recommended. If the nodules are stable at time of repeat CT, then future CT at 18-24 months (from today's scan) is considered optional for low-risk patients, but is recommended for high-risk patients. This recommendation follows the consensus statement: Guidelines for Management of Incidental Pulmonary Nodules Detected on CT Images: From the Fleischner Society 2017; Radiology 2017; 284:228-243.** CT ABDOMEN AND PELVIS: 1. No CT findings of acute trauma. No acute intra-abdominopelvic process. 2. Similar mesenteric inflammatory changes and lymphadenopathy. 3. Prostatomegaly. Aortic Atherosclerosis (ICD10-I70.0). Electronically Signed   By: Elon Alas M.D.   On: 10/30/2017 01:05   Ct Abdomen Pelvis W Contrast  Result  Date: 10/30/2017 CLINICAL DATA:  Slipped and fell in bathtub. RIGHT rib pain. Brief loss of consciousness. History of skin cancer, hypertension, hernia repair and back surgery. EXAM: CT CHEST, ABDOMEN, AND PELVIS WITH CONTRAST TECHNIQUE: Multidetector CT imaging of the chest, abdomen and pelvis was performed following the standard protocol during bolus administration of intravenous contrast. CONTRAST:  159mL ISOVUE-300 IOPAMIDOL (ISOVUE-300) INJECTION 61% COMPARISON:  CT abdomen March 05, 2012 FINDINGS: CT CHEST FINDINGS CARDIOVASCULAR: Heart size is borderline enlarged. Mild coronary artery calcification. No pericardial effusions. Thoracic aorta is normal course and caliber, mild calcific atherosclerosis. MEDIASTINUM/NODES: No mediastinal mass. No lymphadenopathy by CT size criteria. Normal appearance of thoracic esophagus though not tailored for evaluation. LUNGS/PLEURA: Tracheobronchial tree is patent, no pneumothorax. No pleural effusion or focal consolidation. Bilobed RIGHT lower lobe 12 mm pulmonary nodule (series 3, image 86), 4 mm subpleural RIGHT upper lobe pulmonary nodule, 4 mm RIGHT upper lobe subsolid pulmonary nodule. Multiple LEFT pulmonary nodules measuring to 7 mm (series 3, image 114). The nodules are new or increased in size from prior CT. MUSCULOSKELETAL: Non-acute.  Loop moderate LEFT anterior chest wall. CT ABDOMEN AND PELVIS FINDINGS HEPATOBILIARY: Liver and gallbladder are nonacute. 12 mm fast filling hemangioma (series 2, image 55). PANCREAS: Normal. SPLEEN: Normal. ADRENALS/URINARY TRACT: Kidneys are orthotopic, demonstrating symmetric enhancement. No nephrolithiasis, hydronephrosis or solid renal masses. Bilateral renal cysts measuring to 3.6 cm on the RIGHT. The unopacified ureters are normal in course and caliber. Delayed imaging through the kidneys demonstrates symmetric prompt contrast excretion within the proximal urinary collecting system. Urinary bladder is partially distended and  unremarkable. Normal adrenal glands. STOMACH/BOWEL: Small hiatal hernia. The stomach, small and large bowel are normal in course and caliber without inflammatory changes. Moderate amount of retained large bowel stool. Mild colonic diverticulosis. Normal appendix. VASCULAR/LYMPHATIC: Aortoiliac vessels are normal in course and caliber. Mild intimal thickening calcific atherosclerosis. Similar misty mesentery with similar mesenteric lymphadenopathy measuring to 15 mm short axis. REPRODUCTIVE: Prostatomegaly invading base of bladder OTHER: No intraperitoneal free fluid or free air. MUSCULOSKELETAL: Non-acute. Moderate to large LEFT fat containing inguinal hernia. Status post RIGHT L4-5 and L5-S1 laminectomies. Moderate to severe L5-S1 degenerative disc. IMPRESSION: CT CHEST: 1. No CT findings of acute trauma. No acute cardiopulmonary process. 2. **An incidental finding of potential clinical significance has been found. Multiple pulmonary nodules measuring to 12 mm. Non-contrast chest CT at 3-6 months is recommended. If the nodules are stable at time of repeat CT, then future CT at 18-24 months (from today's scan) is considered optional for low-risk patients, but is recommended for high-risk patients. This recommendation follows the consensus statement: Guidelines for Management of Incidental Pulmonary Nodules Detected on CT Images: From the Fleischner Society 2017; Radiology 2017; 284:228-243.** CT ABDOMEN AND PELVIS: 1. No CT findings of acute trauma. No acute intra-abdominopelvic  process. 2. Similar mesenteric inflammatory changes and lymphadenopathy. 3. Prostatomegaly. Aortic Atherosclerosis (ICD10-I70.0). Electronically Signed   By: Elon Alas M.D.   On: 10/30/2017 01:05    Procedures Procedures (including critical care time)  Medications Ordered in ED Medications  HYDROcodone-acetaminophen (NORCO/VICODIN) 5-325 MG per tablet 1 tablet (has no administration in time range)     Initial Impression /  Assessment and Plan / ED Course  I have reviewed the triage vital signs and the nursing notes.  Pertinent labs & imaging results that were available during my care of the patient were reviewed by me and considered in my medical decision making (see chart for details).    Mechanical slip and fall with right rib pain as well as possible loss of consciousness.  Vitals are stable.  Lungs climbs are clear.  Rib x-ray done in triage shows no fracture or pneumothorax.  EKG is sinus rhythm.  Patient continues to complain of significant right rib pain despite negative x-ray.  Will obtain CT scan to evaluate for occult pneumothorax or other injury. CT scan does not show any acute traumatic pathology.  No rib fracture or pneumothorax.  No intra-abdominal injury. Multiple lung nodules discussed with patient and need for PCP follow-up and repeat CT scan in 6 months.  We will treat supportively for rib contusion. Follow-up with PCP.  Return precautions discussed.  Final Clinical Impressions(s) / ED Diagnoses   Final diagnoses:  Contusion of rib on right side, initial encounter  Lung nodule    ED Discharge Orders    None       Roxanna Mcever, Annie Main, MD 10/30/17 [phone]

## 2017-10-30 ENCOUNTER — Other Ambulatory Visit (HOSPITAL_COMMUNITY): Payer: Self-pay | Admitting: Physician Assistant

## 2017-10-30 DIAGNOSIS — S20211A Contusion of right front wall of thorax, initial encounter: Secondary | ICD-10-CM | POA: Diagnosis not present

## 2017-10-30 DIAGNOSIS — M79605 Pain in left leg: Principal | ICD-10-CM

## 2017-10-30 DIAGNOSIS — M79604 Pain in right leg: Secondary | ICD-10-CM

## 2017-10-30 MED ORDER — IOPAMIDOL (ISOVUE-300) INJECTION 61%
100.0000 mL | Freq: Once | INTRAVENOUS | Status: AC | PRN
  Administered 2017-10-30: 100 mL via INTRAVENOUS

## 2017-10-30 MED ORDER — HYDROCODONE-ACETAMINOPHEN 5-325 MG PO TABS: 1.0000 | ORAL_TABLET | ORAL | 0 refills | Status: DC | PRN

## 2017-10-30 NOTE — Discharge Instructions (Addendum)
Your CT scan does not show any fractures or serious injury.  It does show multiple lung nodules that need follow-up.  Watch your primary doctor know that you need a repeat scan in 6 months to make sure these are not changing in size.  Return to the ED if you develop chest pain, shortness of breath or any other concerns.

## 2017-11-01 ENCOUNTER — Ambulatory Visit (HOSPITAL_COMMUNITY)
Admission: RE | Admit: 2017-11-01 | Discharge: 2017-11-01 | Disposition: A | Discharge status: Home / Self Care | Payer: Medicare Other | Source: Ambulatory Visit | Attending: Physician Assistant | Admitting: Physician Assistant

## 2017-11-01 ENCOUNTER — Ambulatory Visit (HOSPITAL_COMMUNITY): Admission: RE | Admit: 2017-11-01 | Payer: Medicare Other | Source: Ambulatory Visit

## 2017-11-01 DIAGNOSIS — M79604 Pain in right leg: Secondary | ICD-10-CM | POA: Insufficient documentation

## 2017-11-01 DIAGNOSIS — M79605 Pain in left leg: Secondary | ICD-10-CM | POA: Insufficient documentation

## 2017-11-02 ENCOUNTER — Ambulatory Visit (HOSPITAL_COMMUNITY)
Admission: RE | Admit: 2017-11-02 | Discharge: 2017-11-02 | Disposition: A | Discharge status: Home / Self Care | Payer: Medicare Other | Source: Ambulatory Visit | Attending: Physician Assistant | Admitting: Physician Assistant

## 2017-11-02 ENCOUNTER — Ambulatory Visit (HOSPITAL_COMMUNITY): Payer: Medicare Other

## 2017-11-02 ENCOUNTER — Ambulatory Visit (INDEPENDENT_AMBULATORY_CARE_PROVIDER_SITE_OTHER): Payer: Medicare Other | Admitting: *Deleted

## 2017-11-02 ENCOUNTER — Ambulatory Visit (HOSPITAL_COMMUNITY): Admission: RE | Admit: 2017-11-02 | Payer: Medicare Other | Source: Ambulatory Visit

## 2017-11-02 DIAGNOSIS — M79605 Pain in left leg: Secondary | ICD-10-CM | POA: Insufficient documentation

## 2017-11-02 DIAGNOSIS — Z950 Presence of cardiac pacemaker: Secondary | ICD-10-CM | POA: Diagnosis not present

## 2017-11-02 DIAGNOSIS — E669 Obesity, unspecified: Secondary | ICD-10-CM | POA: Insufficient documentation

## 2017-11-02 DIAGNOSIS — E119 Type 2 diabetes mellitus without complications: Secondary | ICD-10-CM | POA: Diagnosis not present

## 2017-11-02 DIAGNOSIS — I639 Cerebral infarction, unspecified: Secondary | ICD-10-CM

## 2017-11-02 DIAGNOSIS — I10 Essential (primary) hypertension: Secondary | ICD-10-CM | POA: Insufficient documentation

## 2017-11-02 DIAGNOSIS — M79604 Pain in right leg: Secondary | ICD-10-CM | POA: Diagnosis present

## 2017-11-05 NOTE — Progress Notes (Signed)
Carelink Summary Report / Loop Recorder 

## 2017-11-23 LAB — CUP PACEART REMOTE DEVICE CHECK
Date Time Interrogation Session: 20190906220855
MDC IDC PG IMPLANT DT: 20190530

## 2017-11-30 ENCOUNTER — Ambulatory Visit (INDEPENDENT_AMBULATORY_CARE_PROVIDER_SITE_OTHER): Payer: Medicare Other | Admitting: Adult Health

## 2017-11-30 ENCOUNTER — Encounter: Payer: Self-pay | Admitting: Adult Health

## 2017-11-30 VITALS — BP 164/74 | HR 52 | Ht 72.0 in | Wt 201.4 lb

## 2017-11-30 DIAGNOSIS — Z8673 Personal history of transient ischemic attack (TIA), and cerebral infarction without residual deficits: Secondary | ICD-10-CM

## 2017-11-30 DIAGNOSIS — E119 Type 2 diabetes mellitus without complications: Secondary | ICD-10-CM | POA: Diagnosis not present

## 2017-11-30 DIAGNOSIS — I10 Essential (primary) hypertension: Secondary | ICD-10-CM | POA: Diagnosis not present

## 2017-11-30 NOTE — Progress Notes (Signed)
I agree with the above plan 

## 2017-11-30 NOTE — Patient Instructions (Signed)
Continue aspirin 81 mg daily  and lipitor  for secondary stroke prevention  Continue to follow up with PCP regarding cholesterol, blood pressure and diabetes management  Call PCP due to continued right side pain along with lipoma for possible need of intervention  Continue to stay active and maintain a healthy diet  Continue to monitor blood pressure at home  Maintain strict control of hypertension with blood pressure goal below 130/90, diabetes with hemoglobin A1c goal below 6.5% and cholesterol with LDL cholesterol (bad cholesterol) goal below 70 mg/dL. I also advised the patient to eat a healthy diet with plenty of whole grains, cereals, fruits and vegetables, exercise regularly and maintain ideal body weight.  Followup in the future with me as needed or call earlier if needed       Thank you for coming to see [place] at [facility]. I hope we have been able to provide you high quality care today.  You may receive a patient satisfaction survey over the next few weeks. We would appreciate your feedback and comments so that we may continue to improve ourselves and the health of our patients.

## 2017-11-30 NOTE — Progress Notes (Signed)
[facility] [address] [place]. [place] [address] [phone]       OFFICE FOLLOW UP NOTE  Patrick Phillips Date of Birth:  [birth year] Medical Record Number:  [id number]   Reason for visit: Stroke follow up  CHIEF COMPLAINT:  Chief Complaint  Patient presents with  . Follow-up    last seen 08/28/17 and advised to continue ASA and lipitor.   . Other    Pt had a fall in the shower and is still having pain on his right side. He has a large, painful knot on his side.     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 History obtained from patient and chart review. Reviewed all radiology images and labs personally.  [name] is a 77 y.o. male with history of hypertension, diabetes, hyperlipidemia, alcohol use and sinus bradycardia who presented with right arm numbness that started on 07/21/2017.  Patient initially presented to antepartum emergency room with chest pain right-sided numbness/weakness where he had CT head obtained and negative for acute stroke.  Second troponin came back at 0.11 and was told any pain that he be transferred to [facility] but he refused and left the hospital.  On 07/16/2017, daughter called PCP who recommended that he return to hospital and he arrived to [facility]. MRI head reviewed which is motion degraded but did show subcentimeter left temporal lobe infarct.  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  MRA neck reviewed and was also motion degraded but unremarkable.  2D echo showed an EF of 60 to 65% without cardiac source of embolus.  TEE done which is negative for cardiac source of embolus and negative for PFO.  Loop recorder placed on 07/26/2017.  LDL satisfactory 41.  A1c elevated at 8.0 and recommended tight glycemic control with close PCP follow-up.  As patient was not on antithrombotic PTA was  recommended DAPT for 3 weeks on aspirin alone.  Patient was previously on Lipitor 20 mg and this was recommended to resume.  Patient discharged home in stable condition.  08/28/2017 visit: Patient is being seen today for hospital follow-up and is accompanied by his daughter.  States his arm numbness completely resolved and is back to doing all previous activities.  Continues to take aspirin only (as 3 weeks of DAPT with aspirin and Plavix completed) without side effects of bleeding or bruising.  Continues to take Lipitor without side effects myalgias.  Blood pressure today mildly elevated at 150/69 but per daughter, SBP typically 130-150 when at other doctor's appointments.  Patient does not monitor this at home but daughter states that she will obtain him a BP machine so he can start doing this.  Loop recorder negative for atrial fibrillation thus far.  On Saturday, patient was bit on the right index finger by his dog after his dog was choking on an object and it upon removing his fingers out of the dog's mouth, dog bit down and punctured 3 areas in his right index finger.  Patient states it continues to swell and he is unable to bend it most likely due to amount of swelling.  Patient has not followed up with PCP in regards to this and is also not tried using ice or Tylenol for swelling.  Highly recommended follow-up with PCP for possible antibiotic prophylaxis or imaging.  Also highly recommended use of Tylenol and  ice to decrease the amount of swelling.  Denies new or worsening stroke/TIA symptoms.  Interval history 11/30/2017: Patient is being seen today for scheduled follow-up visit and is accompanied by his daughter.  He was seen in the ED on 10/29/2017 with right rib and abdominal pain after a fall in the shower.  Per notes, he did endorse hitting his head on the commode but patient denied loss of consciousness but per triage nurse he did state that he was "knocked out".  CT head did not show acute  abnormality.  X-ray was negative for fracture or pneumothorax.  CT chest was performed to evaluate occult pneumothorax or other injury and was found to have multiple lung nodules and was recommended to follow-up with PCP for repeat CT scan in 6 months time.  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  He believes this pain is due to the lipoma.  He does take Tylenol as needed with little relief.  He does state that since his fall though his pain has subsided some.  Denies any recurrent fall.  Continues to take aspirin without side effects of bleeding or bruising.  Continues to take Lipitor without side effects myalgias.  Blood pressure today elevated at 164/74 but per daughter, this is elevated as he does monitor at home and typical SBP 120s.  He does not routinely monitor glucose levels at home as he does not like to be "poked" but will check if he has symptoms of hyper or hypoglycemia. Loop recorder has not shown atrial fibrillation thus far.  Denies new or worsening stroke/TIA symptoms.    ROS:   14 system review of systems performed and negative with exception of insomnia, dizziness and headache  PMH:  Past Medical History:  Diagnosis Date  . Cancer ([place])    skin cancer  . Diabetes mellitus without complication ([place])   . Hypertension     PSH:  Past Surgical History:  Procedure Laterality Date  . BACK SURGERY    . HERNIA REPAIR    . LOOP RECORDER INSERTION N/A 07/26/2017   Procedure: LOOP RECORDER INSERTION;  Surgeon: Evans Lance, MD;  Location: [facility];  Service: Cardiovascular;  Laterality: N/A;  . TEE WITHOUT CARDIOVERSION N/A 07/26/2017   Procedure: TRANSESOPHAGEAL ECHOCARDIOGRAM (TEE);  Surgeon: Fay Records, MD;  Location: [facility] ENDOSCOPY;  Service: Cardiovascular;  Laterality: N/A;  . TRIGGER FINGER RELEASE Right 12/02/2015   Procedure: RIGHT THUMB RELEASE TRIGGER FINGER/A-1  PULLEY;  Surgeon: Leanora Cover, MD;  Location: [facility];  Service: Orthopedics;  Laterality: Right;    Social History:  Social History   Socioeconomic History  . Marital status: Married    Spouse name: Not on file  . Number of children: Not on file  . Years of education: Not on file  . Highest education level: Not on file  Occupational History  . Not on file  Social Needs  . Financial resource strain: Not on file  . Food insecurity:    Worry: Not on file    Inability: Not on file  . Transportation needs:    Medical: Not on file    Non-medical: Not on file  Tobacco Use  . Smoking status: Never Smoker  . Smokeless tobacco: Never Used  Substance and Sexual Activity  . Alcohol use: Yes    Comment: occ  . Drug use: No  . Sexual activity: Not  on file  Lifestyle  . Physical activity:    Days per week: Not on file    Minutes per session: Not on file  . Stress: Not on file  Relationships  . Social connections:    Talks on phone: Not on file    Gets together: Not on file    Attends religious service: Not on file    Active member of club or organization: Not on file    Attends meetings of clubs or organizations: Not on file    Relationship status: Not on file  . Intimate partner violence:    Fear of current or ex partner: Not on file    Emotionally abused: Not on file    Physically abused: Not on file    Forced sexual activity: Not on file  Other Topics Concern  . Not on file  Social History Narrative  . Not on file    Family History:  Family History  Problem Relation Age of Onset  . Cancer Mother   . Cancer Father   . Cancer Brother   . Cancer Other     Medications:   Current Outpatient Medications on File Prior to Visit  Medication Sig Dispense Refill  . amLODipine (NORVASC) 5 MG tablet Take 5 mg by mouth 2 (two) times daily.    Marland Kitchen aspirin EC 81 MG tablet Take 81 mg by mouth daily.    Marland Kitchen escitalopram (LEXAPRO) 10 MG tablet Take 10 mg by mouth  at bedtime.     Marland Kitchen glipiZIDE (GLUCOTROL) 5 MG tablet Take 5 mg by mouth daily.    Marland Kitchen HYDROcodone-acetaminophen (NORCO/VICODIN) 5-325 MG tablet Take 1 tablet by mouth every 4 (four) hours as needed. 10 tablet 0  . metFORMIN (GLUCOPHAGE) 1000 MG tablet Take 1,000 mg by mouth 2 (two) times daily.     Marland Kitchen atorvastatin (LIPITOR) 20 MG tablet Take 2 tablets (40 mg total) by mouth daily. 60 tablet 0   No current facility-administered medications on file prior to visit.     Allergies:  No Known Allergies   Physical Exam  Vitals:   11/30/17 0910  BP: (!) 164/74  Pulse: (!) 52  Weight: 201 lb 6.4 oz (91.4 kg)  Height: 6' (1.829 m)   Body mass index is 27.31 kg/m. No exam data present  General: well developed, well nourished, pleasant elderly Caucasian male, seated, in no evident distress Head: head normocephalic and atraumatic.   Neck: supple with no carotid or supraclavicular bruits Cardiovascular: regular rate and rhythm, no murmurs Musculoskeletal: Swelling right index finger -negative for signs of infection such as warmth or redness -decreased range of motion possibly due to swelling and/or pain Skin:  no rash/petichiae; lipoma present right side lower portion of ribs -painful during examination Vascular:  Normal pulses all extremities  Neurologic Exam Mental Status: Awake and fully alert. Oriented to place and time. Recent and remote memory intact. Attention span, concentration and fund of knowledge appropriate. Mood and affect appropriate.  Cranial Nerves: Fundoscopic exam deferred. Pupils equal, briskly reactive to light. Extraocular movements full without nystagmus. Visual fields full to confrontation. Hearing intact. Facial sensation intact. Face, tongue, palate moves normally and symmetrically.  Motor: Normal bulk and tone. Normal strength in all tested extremity muscles. Sensory.: intact to touch , pinprick , position and vibratory sensation.  Coordination: Rapid alternating  movements normal in all extremities. Finger-to-nose and heel-to-shin performed accurately bilaterally. Gait and Station: Arises from chair without difficulty. Stance is normal. Gait demonstrates normal stride  length and balance . Able to heel, toe and tandem walk without difficulty.  Reflexes: 1+ and symmetric. Toes downgoing.     Diagnostic Data (Labs, Imaging, Testing)  CT HEAD WO CONTRAST  10/29/2017 IMPRESSION: 1. No acute intracranial hemorrhage. 2. Minimal age-related atrophy and chronic microvascular ischemic changes and small old right basal ganglia lacunar infarct.  DG ribs unilateral with chest right 10/29/2017 IMPRESSION: Negative  CT ABDOMEN PELVIS W CONTRAST CT CHEST W CONTRAST 10/29/2017 IMPRESSION: CT CHEST: 1. No CT findings of acute trauma. No acute cardiopulmonary process. 2. **An incidental finding of potential clinical significance has been found. Multiple pulmonary nodules measuring to 12 mm. Non-contrast chest CT at 3-6 months is recommended. If the nodules are stable at time of repeat CT, then future CT at 18-24 months (from today's scan) is considered optional for low-risk patients, but is recommended for high-risk patients. This recommendation follows the consensus statement: Guidelines for Management of Incidental Pulmonary Nodules Detected on CT Images: From the Fleischner Society 2017; Radiology 2017; 284:228-243.** CT ABDOMEN AND PELVIS: 1. No CT findings of acute trauma. No acute intra-abdominopelvic process. 2. Similar mesenteric inflammatory changes and lymphadenopathy. 3. Prostatomegaly.    ASSESSMENT: Faith Branan is a 77 y.o. 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 Vascular risk factors include HTN and HLD.  Patient had recent admission on 10/29/2017 after a fall in the shower with continued complaints of right rib pain along with painful lipoma in same area.  Patient has been stable from stroke  standpoint without residual deficits or recurring of symptoms.    PLAN: -Continue aspirin 81 mg daily  and Lipitor for secondary stroke prevention -F/u with PCP regarding your HLD, HTN and DM management -Advised to follow with PCP in regards to continued painful area along with assessment of lipoma as patient is requesting this be removed.  Patient and daughter were advised that as this is the same area that he fell, it is unclear if he is continued to experience pain from fall or from lipoma itself as he is able to pinpoint exact area of pain at lipoma area -Advised to continue Tylenol PRN for pain along with use of ice/heat -Continue to monitor loop recorder to rule out atrial fibrillation -continue to monitor BP at home -Advised patient to continue to stay active as tolerated and eat a healthy diet -Maintain strict control of hypertension with blood pressure goal below 130/90, diabetes with hemoglobin A1c goal below 6.5% and cholesterol with LDL cholesterol (bad cholesterol) goal below 70 mg/dL. I also advised the patient to eat a healthy diet with plenty of whole grains, cereals, fruits and vegetables, exercise regularly and maintain ideal body weight.  Follow up as needed as patient stable from stroke standpoint and was advised to call with questions, concerns or need a follow-up appointment  Greater than 50% of time during this 25 minute visit was spent on counseling,explanation of diagnosis of left temporal lobe infarct, reviewing risk factor management of HLD, HTN and DM, planning of further management, discussion with patient and family and coordination of care    Venancio Poisson, [facility]  [facility] [address] [place], [place] [address]  Phone [phone] Fax [phone]

## 2017-12-05 ENCOUNTER — Telehealth: Payer: Self-pay

## 2017-12-05 ENCOUNTER — Ambulatory Visit (INDEPENDENT_AMBULATORY_CARE_PROVIDER_SITE_OTHER): Payer: Medicare Other | Admitting: *Deleted

## 2017-12-05 DIAGNOSIS — I639 Cerebral infarction, unspecified: Secondary | ICD-10-CM

## 2017-12-05 NOTE — Telephone Encounter (Signed)
Spoke with pt's daughter informed her that the monitor at home updates and checks for episodes on a nightly basis and that  Once a month we bill. Informed her that the 1:30pm apt time was for clerical purposes only. Pts daughter voiced understanding

## 2017-12-05 NOTE — Telephone Encounter (Signed)
Pt daughter had questions about home monitoring. I tried to help her but she did not understand what I was trying to tell her.

## 2017-12-06 NOTE — Progress Notes (Signed)
Carelink Summary Report / Loop Recorder 

## 2017-12-20 LAB — CUP PACEART REMOTE DEVICE CHECK
Implantable Pulse Generator Implant Date: 20190530
MDC IDC SESS DTM: 20191009223605

## 2018-01-07 ENCOUNTER — Ambulatory Visit (INDEPENDENT_AMBULATORY_CARE_PROVIDER_SITE_OTHER): Payer: Medicare Other | Admitting: *Deleted

## 2018-01-07 DIAGNOSIS — I639 Cerebral infarction, unspecified: Secondary | ICD-10-CM

## 2018-01-08 NOTE — Progress Notes (Signed)
Carelink Summary Report / Loop Recorder 

## 2018-01-22 ENCOUNTER — Other Ambulatory Visit: Payer: Self-pay

## 2018-01-22 ENCOUNTER — Emergency Department (HOSPITAL_COMMUNITY)
Admission: EM | Admit: 2018-01-22 | Discharge: 2018-01-22 | Disposition: A | Discharge status: Home / Self Care | Payer: Medicare Other | Attending: Emergency Medicine | Admitting: Emergency Medicine

## 2018-01-22 ENCOUNTER — Encounter (HOSPITAL_COMMUNITY): Payer: Self-pay | Admitting: Emergency Medicine

## 2018-01-22 ENCOUNTER — Emergency Department (HOSPITAL_COMMUNITY): Payer: Medicare Other

## 2018-01-22 DIAGNOSIS — Z95818 Presence of other cardiac implants and grafts: Secondary | ICD-10-CM | POA: Diagnosis not present

## 2018-01-22 DIAGNOSIS — Y999 Unspecified external cause status: Secondary | ICD-10-CM | POA: Diagnosis not present

## 2018-01-22 DIAGNOSIS — I10 Essential (primary) hypertension: Secondary | ICD-10-CM | POA: Insufficient documentation

## 2018-01-22 DIAGNOSIS — Z7984 Long term (current) use of oral hypoglycemic drugs: Secondary | ICD-10-CM | POA: Insufficient documentation

## 2018-01-22 DIAGNOSIS — Z85828 Personal history of other malignant neoplasm of skin: Secondary | ICD-10-CM | POA: Diagnosis not present

## 2018-01-22 DIAGNOSIS — Z8673 Personal history of transient ischemic attack (TIA), and cerebral infarction without residual deficits: Secondary | ICD-10-CM | POA: Diagnosis not present

## 2018-01-22 DIAGNOSIS — X58XXXA Exposure to other specified factors, initial encounter: Secondary | ICD-10-CM | POA: Insufficient documentation

## 2018-01-22 DIAGNOSIS — Z79899 Other long term (current) drug therapy: Secondary | ICD-10-CM | POA: Insufficient documentation

## 2018-01-22 DIAGNOSIS — L03032 Cellulitis of left toe: Secondary | ICD-10-CM | POA: Insufficient documentation

## 2018-01-22 DIAGNOSIS — E119 Type 2 diabetes mellitus without complications: Secondary | ICD-10-CM | POA: Insufficient documentation

## 2018-01-22 DIAGNOSIS — Z7982 Long term (current) use of aspirin: Secondary | ICD-10-CM | POA: Insufficient documentation

## 2018-01-22 DIAGNOSIS — S99922A Unspecified injury of left foot, initial encounter: Secondary | ICD-10-CM | POA: Diagnosis present

## 2018-01-22 DIAGNOSIS — Y929 Unspecified place or not applicable: Secondary | ICD-10-CM | POA: Diagnosis not present

## 2018-01-22 DIAGNOSIS — S92522A Displaced fracture of medial phalanx of left lesser toe(s), initial encounter for closed fracture: Secondary | ICD-10-CM | POA: Insufficient documentation

## 2018-01-22 DIAGNOSIS — Y9389 Activity, other specified: Secondary | ICD-10-CM | POA: Insufficient documentation

## 2018-01-22 DIAGNOSIS — S92502A Displaced unspecified fracture of left lesser toe(s), initial encounter for closed fracture: Secondary | ICD-10-CM

## 2018-01-22 MED ORDER — ACETAMINOPHEN 325 MG PO TABS
650.0000 mg | ORAL_TABLET | ORAL | Status: DC | PRN
  Administered 2018-01-22: 650 mg via ORAL
  Filled 2018-01-22: qty 2

## 2018-01-22 MED ORDER — CLINDAMYCIN HCL 150 MG PO CAPS
300.0000 mg | ORAL_CAPSULE | Freq: Once | ORAL | Status: AC
  Administered 2018-01-22: 300 mg via ORAL
  Filled 2018-01-22: qty 2

## 2018-01-22 MED ORDER — CLINDAMYCIN HCL 300 MG PO CAPS: 300.0000 mg | ORAL_CAPSULE | Freq: Four times a day (QID) | ORAL | 0 refills | Status: DC

## 2018-01-22 NOTE — ED Provider Notes (Signed)
[facility] EMERGENCY DEPARTMENT Provider Note   CSN: [id number] Arrival date & time: 01/22/18  1818     History   Chief Complaint Chief Complaint  Patient presents with  . Toe Pain    HPI Patrick Phillips is a 77 y.o. male.  HPI Patient with 5 days to swelling and pain to the distal third digit of the left foot.  States he has had yellow purulent drainage from the site.  Has had some erythema and pain.  Denies any known injury.  No fever or chills. Past Medical History:  Diagnosis Date  . Cancer ([place])    skin cancer  . Diabetes mellitus without complication ([place])   . Hypertension     Patient Active Problem List   Diagnosis Date Noted  . Acute ischemic stroke ([place]) 07/24/2017  . Chest pain 12/22/2015  . Chronic back pain 12/22/2015  . Syncope 10/17/2013  . Sinus bradycardia 10/17/2013  . SOB (shortness of breath) 10/17/2013  . Hypertension   . Diabetes mellitus without complication [facility])     Past Surgical History:  Procedure Laterality Date  . BACK SURGERY    . HERNIA REPAIR    . LOOP RECORDER INSERTION N/A 07/26/2017   Procedure: LOOP RECORDER INSERTION;  Surgeon: Evans Lance, MD;  Location: [place] [facility];  Service: Cardiovascular;  Laterality: N/A;  . TEE WITHOUT CARDIOVERSION N/A 07/26/2017   Procedure: TRANSESOPHAGEAL ECHOCARDIOGRAM (TEE);  Surgeon: Fay Records, MD;  Location: [facility] ENDOSCOPY;  Service: Cardiovascular;  Laterality: N/A;  . TRIGGER FINGER RELEASE Right 12/02/2015   Procedure: RIGHT THUMB RELEASE TRIGGER FINGER/A-1 PULLEY;  Surgeon: Leanora Cover, MD;  Location: [place];  Service: Orthopedics;  Laterality: Right;        Home Medications    Prior to Admission medications   Medication Sig Start Date End Date Taking? Authorizing Provider  amLODipine (NORVASC) 5 MG tablet Take 5 mg by mouth 2 (two) times daily. 05/30/17   [provider]  aspirin EC 81 MG tablet Take 81 mg by mouth daily.    [provider]    atorvastatin (LIPITOR) 20 MG tablet Take 2 tablets (40 mg total) by mouth daily. 07/26/17 10/10/17  ShahmehdiValeria Batman, MD  clindamycin (CLEOCIN) 300 MG capsule Take 1 capsule (300 mg total) by mouth 4 (four) times daily. X 7 days 01/23/18   Julianne Rice, MD  escitalopram (LEXAPRO) 10 MG tablet Take 10 mg by mouth at bedtime.  07/11/17   [provider]  glipiZIDE (GLUCOTROL) 5 MG tablet Take 5 mg by mouth daily. 07/11/17   [provider]  metFORMIN (GLUCOPHAGE) 1000 MG tablet Take 1,000 mg by mouth 2 (two) times daily.  07/31/13   [provider]    Family History Family History  Problem Relation Age of Onset  . Cancer Mother   . Cancer Father   . Cancer Brother   . Cancer Other     Social History Social History   Tobacco Use  . Smoking status: Never Smoker  . Smokeless tobacco: Never Used  Substance Use Topics  . Alcohol use: Yes    Comment: occ  . Drug use: No     Allergies   Patient has no known allergies.   Review of Systems Review of Systems  Constitutional: Negative for chills and fever.  Musculoskeletal: Positive for arthralgias.  Skin: Positive for color change. Negative for wound.  Neurological: Negative for weakness and numbness.  All other systems reviewed and  are negative.    Physical Exam Updated Vital Signs BP 122/76 (BP Location: Right Arm)   Pulse 60   Temp 97.9 F (36.6 C) (Oral)   Resp 18   Ht 6' (1.829 m)   Wt 95.3 kg   SpO2 96%   BMI 28.48 kg/m   Physical Exam  Constitutional: He is oriented to person, place, and time. He appears well-developed and well-nourished. No distress.  HENT:  Head: Normocephalic and atraumatic.  Mouth/Throat: Oropharynx is clear and moist.  Eyes: Pupils are equal, round, and reactive to light. EOM are normal.  Neck: Normal range of motion. Neck supple.  Cardiovascular: Normal rate and regular rhythm.  Pulmonary/Chest: Effort normal and breath sounds normal.  Abdominal: Soft.  Bowel sounds are normal. There is no tenderness. There is no rebound and no guarding.  Musculoskeletal: Normal range of motion. He exhibits tenderness. He exhibits no edema.  Patient with tenderness to palpation to the distal third digit of the left foot.  Has a small amount of ecchymosis just proximal to the nail.  There is erythema surrounding the ecchymosis.  There are no areas of fluctuance.  Good distal cap refill.  2+ dorsalis pedis and posterior tibial pulses.  Neurological: He is alert and oriented to person, place, and time.  Skin: Skin is warm and dry. Capillary refill takes less than 2 seconds. No rash noted. There is erythema.  Psychiatric: He has a normal mood and affect. His behavior is normal.  Nursing note and vitals reviewed.    ED Treatments / Results  Labs (all labs ordered are listed, but only abnormal results are displayed) Labs Reviewed - No data to display  EKG None  Radiology Dg Foot Complete Left  Result Date: 01/22/2018 CLINICAL DATA:  Pain third toe.  Diabetic EXAM: LEFT FOOT - COMPLETE 3+ VIEW COMPARISON:  12/08/2016 FINDINGS: On the lateral view, there is a fracture at the base of 1 of the middle phalanges most likely the third toe given the symptoms. This appears acute. Negative for osteomyelitis. Degenerative change in the first metatarsophalangeal joint. Arterial calcification. IMPRESSION: Acute fracture base of third middle phalanx. Electronically Signed   By: Franchot Gallo M.D.   On: 01/22/2018 19:49    Procedures Procedures (including critical care time)  Medications Ordered in ED Medications  acetaminophen (TYLENOL) tablet 650 mg (650 mg Oral Given 01/22/18 1852)  clindamycin (CLEOCIN) capsule 300 mg (300 mg Oral Given 01/22/18 1853)     Initial Impression / Assessment and Plan / ED Course  I have reviewed the triage vital signs and the nursing notes.  Pertinent labs & imaging results that were available during my care of the patient were  reviewed by me and considered in my medical decision making (see chart for details).     Patient with small hematoma to the distal part of the left third digit with mild surrounding erythema and warmth.  Given purulent discharge concern for early infection.  Will start on antibiotics.  Will need reexam and 48 hours or sooner if symptoms are worsening.  Final Clinical Impressions(s) / ED Diagnoses   Final diagnoses:  Closed fracture of phalanx of left third toe, initial encounter  Cellulitis of toe of left foot    ED Discharge Orders         Ordered    clindamycin (CLEOCIN) 300 MG capsule  4 times daily     01/22/18 2008           Julianne Rice, MD  01/22/18 2013  

## 2018-01-22 NOTE — ED Triage Notes (Signed)
Patient states he had a hematoma to right 3rd toe x 5 days. States he was seen at Urgent Care on Sunday and given cream. States toenail is black. Denies injury.

## 2018-02-11 ENCOUNTER — Ambulatory Visit (INDEPENDENT_AMBULATORY_CARE_PROVIDER_SITE_OTHER): Payer: Medicare Other

## 2018-02-11 DIAGNOSIS — I639 Cerebral infarction, unspecified: Secondary | ICD-10-CM | POA: Diagnosis not present

## 2018-02-11 NOTE — Progress Notes (Signed)
Carelink Summary Report / Loop Recorder 

## 2018-02-23 LAB — CUP PACEART REMOTE DEVICE CHECK
Implantable Pulse Generator Implant Date: 20190530
MDC IDC SESS DTM: 20191111231102

## 2018-03-09 ENCOUNTER — Emergency Department (HOSPITAL_COMMUNITY): Payer: Medicare Other

## 2018-03-09 ENCOUNTER — Other Ambulatory Visit: Payer: Self-pay

## 2018-03-09 ENCOUNTER — Encounter (HOSPITAL_COMMUNITY): Payer: Self-pay | Admitting: Emergency Medicine

## 2018-03-09 ENCOUNTER — Emergency Department (HOSPITAL_COMMUNITY)
Admission: EM | Admit: 2018-03-09 | Discharge: 2018-03-09 | Disposition: A | Discharge status: Home / Self Care | Payer: Medicare Other | Attending: Emergency Medicine | Admitting: Emergency Medicine

## 2018-03-09 DIAGNOSIS — M19041 Primary osteoarthritis, right hand: Secondary | ICD-10-CM | POA: Insufficient documentation

## 2018-03-09 DIAGNOSIS — I10 Essential (primary) hypertension: Secondary | ICD-10-CM | POA: Insufficient documentation

## 2018-03-09 DIAGNOSIS — E119 Type 2 diabetes mellitus without complications: Secondary | ICD-10-CM | POA: Diagnosis not present

## 2018-03-09 DIAGNOSIS — Z85828 Personal history of other malignant neoplasm of skin: Secondary | ICD-10-CM | POA: Diagnosis not present

## 2018-03-09 DIAGNOSIS — Z7984 Long term (current) use of oral hypoglycemic drugs: Secondary | ICD-10-CM | POA: Diagnosis not present

## 2018-03-09 DIAGNOSIS — M79641 Pain in right hand: Secondary | ICD-10-CM | POA: Diagnosis present

## 2018-03-09 DIAGNOSIS — Z79899 Other long term (current) drug therapy: Secondary | ICD-10-CM | POA: Insufficient documentation

## 2018-03-09 MED ORDER — HYDROCODONE-ACETAMINOPHEN 5-325 MG PO TABS
1.0000 | ORAL_TABLET | Freq: Once | ORAL | Status: AC
  Administered 2018-03-09: 1 via ORAL
  Filled 2018-03-09: qty 1

## 2018-03-09 MED ORDER — HYDROCODONE-ACETAMINOPHEN 5-325 MG PO TABS: 1.0000 | ORAL_TABLET | ORAL | 0 refills | Status: DC | PRN

## 2018-03-09 MED ORDER — ACETAMINOPHEN 325 MG PO TABS
650.0000 mg | ORAL_TABLET | Freq: Once | ORAL | Status: AC
  Administered 2018-03-09: 650 mg via ORAL
  Filled 2018-03-09: qty 2

## 2018-03-09 NOTE — ED Provider Notes (Signed)
[facility] Provider Note   CSN: [id number] Arrival date & time: 03/09/18  1750     History   Chief Complaint Chief Complaint  Patient presents with  . Hand Pain    HPI Zyion Doxtater is a 78 y.o. male with a history of DM, htn and surgical history including distant h/o trigger finger release of his right thumb, here with complaint of right index finger pain localizing to the mcp joint. He has had pain for several weeks, woke from a nap today with increased pain at the site.  He denies injury but is active with woodworking but not specific trauma or overuse.  He has taken aleve with some improvement.  He denies numbness or weakness in the finger, describes a constant throbbing pain.  The history is provided by the patient.    Past Medical History:  Diagnosis Date  . Cancer ([place])    skin cancer  . Diabetes mellitus without complication ([place])   . Hypertension     Patient Active Problem List   Diagnosis Date Noted  . Acute ischemic stroke ([place]) 07/24/2017  . Chest pain 12/22/2015  . Chronic back pain 12/22/2015  . Syncope 10/17/2013  . Sinus bradycardia 10/17/2013  . SOB (shortness of breath) 10/17/2013  . Hypertension   . Diabetes mellitus without complication [facility])     Past Surgical History:  Procedure Laterality Date  . BACK SURGERY    . HERNIA REPAIR    . LOOP RECORDER INSERTION N/A 07/26/2017   Procedure: LOOP RECORDER INSERTION;  Surgeon: Evans Lance, MD;  Location: [place] [facility];  Service: Cardiovascular;  Laterality: N/A;  . TEE WITHOUT CARDIOVERSION N/A 07/26/2017   Procedure: TRANSESOPHAGEAL ECHOCARDIOGRAM (TEE);  Surgeon: Fay Records, MD;  Location: [facility] ENDOSCOPY;  Service: Cardiovascular;  Laterality: N/A;  . TRIGGER FINGER RELEASE Right 12/02/2015   Procedure: RIGHT THUMB RELEASE TRIGGER FINGER/A-1 PULLEY;  Surgeon: Leanora Cover, MD;  Location: [place];  Service: Orthopedics;  Laterality: Right;        Home  Medications    Prior to Admission medications   Medication Sig Start Date End Date Taking? Authorizing Provider  amLODipine (NORVASC) 5 MG tablet Take 5 mg by mouth 2 (two) times daily. 05/30/17   [provider]  aspirin EC 81 MG tablet Take 81 mg by mouth daily.    [provider]  atorvastatin (LIPITOR) 20 MG tablet Take 2 tablets (40 mg total) by mouth daily. 07/26/17 10/10/17  ShahmehdiValeria Batman, MD  clindamycin (CLEOCIN) 300 MG capsule Take 1 capsule (300 mg total) by mouth 4 (four) times daily. X 7 days 01/23/18   Julianne Rice, MD  escitalopram (LEXAPRO) 10 MG tablet Take 10 mg by mouth at bedtime.  07/11/17   [provider]  glipiZIDE (GLUCOTROL) 5 MG tablet Take 5 mg by mouth daily. 07/11/17   [provider]  HYDROcodone-acetaminophen (NORCO/VICODIN) 5-325 MG tablet Take 1 tablet by mouth every 4 (four) hours as needed. 03/09/18   Evalee Jefferson, PA-C  metFORMIN (GLUCOPHAGE) 1000 MG tablet Take 1,000 mg by mouth 2 (two) times daily.  07/31/13   [provider]    Family History Family History  Problem Relation Age of Onset  . Cancer Mother   . Cancer Father   . Cancer Brother   . Cancer Other     Social History Social History   Tobacco Use  . Smoking status: Never Smoker  . Smokeless tobacco: Never Used  Substance Use Topics  . Alcohol use: Yes    Comment: occ  . Drug use: No     Allergies   Patient has no known allergies.   Review of Systems Review of Systems  Constitutional: Negative for fever.  Musculoskeletal: Positive for arthralgias and joint swelling. Negative for myalgias.  Neurological: Negative for weakness and numbness.     Physical Exam Updated Vital Signs BP (!) 153/74 (BP Location: Left Arm)   Pulse (!) 54   Temp (!) 97.5 F (36.4 C) (Oral)   Resp 12   Ht 6' (1.829 m)   Wt 99.8 kg   SpO2 98%   BMI 29.84 kg/m   Physical Exam Constitutional:      Appearance: He is well-developed.  HENT:      Head: Atraumatic.  Neck:     Musculoskeletal: Normal range of motion.  Cardiovascular:     Comments: Pulses equal bilaterally Musculoskeletal:        General: Tenderness present. No swelling, deformity or signs of injury.       Hands:     Comments: ttp right index finger mcp. No edema but there is bony enlargement of the joint.  Distal sensation intact.  Less than 2 sec cap refill in fingertips.  Skin intact, no erythema or edema.  He does have difficulty making a full fist with the right hand, demonstrating the long finger will not bend at the dip joint (chronic).   Skin:    General: Skin is warm and dry.  Neurological:     Mental Status: He is alert.     Sensory: No sensory deficit.     Deep Tendon Reflexes: Reflexes normal.      ED Treatments / Results  Labs (all labs ordered are listed, but only abnormal results are displayed) Labs Reviewed - No data to display  EKG None  Radiology Dg Hand Complete Right  Result Date: 03/09/2018 CLINICAL DATA:  Right hand pain. No injury. Most pain at the right index finger MCP joint. Swelling at the joint. EXAM: RIGHT HAND - COMPLETE 3+ VIEW COMPARISON:  None. FINDINGS: No fracture or bone lesion. There is asymmetric joint space narrowing with marginal osteophytes involving the interphalangeal joints, most evident the DIP and PIP joints of the index finger. No periarticular erosions. The MCP P joints and wrist joints are well maintained. There is soft tissue swelling of the index finger most evident adjacent to the DIP and PIP joints. IMPRESSION: 1. No fracture or acute finding. 2. Arthropathic changes as described most consistent with osteoarthritis. This is most notable involving the index finger. Electronically Signed   By: Lajean Manes M.D.   On: 03/09/2018 18:47    Procedures Procedures (including critical care time)  Medications Ordered in ED Medications  acetaminophen (TYLENOL) tablet 650 mg (650 mg Oral Given 03/09/18 1910)    HYDROcodone-acetaminophen (NORCO/VICODIN) 5-325 MG per tablet 1 tablet (1 tablet Oral Given 03/09/18 1910)     Initial Impression / Assessment and Plan / ED Course  I have reviewed the triage vital signs and the nursing notes.  Pertinent labs & imaging results that were available during my care of the patient were reviewed by me and considered in my medical decision making (see chart for details).     Pt with acute on chronic right index finger pain, no injury, no exam findings to suggest infection or gout (no hx of this condition).  Xray c/w arthritis as is exam.  Advised adding arthritis strength  tylenol,  heat. Referral to Dr. Fredna Dow prn if sx persist/worsen. He was also given a few hydrocodone tabs.  Unable to take nsaids given lexapro, diabetic, so avoided steroids.   database reviewed.      Final Clinical Impressions(s) / ED Diagnoses   Final diagnoses:  Primary osteoarthritis of right hand    ED Discharge Orders         Ordered    HYDROcodone-acetaminophen (NORCO/VICODIN) 5-325 MG tablet  Every 4 hours PRN     03/09/18 1904           Evalee Jefferson, PA-C 03/09/18 1911    Francine Graven, DO 03/10/18 1723

## 2018-03-09 NOTE — Discharge Instructions (Addendum)
I recommend taking Arthritis Strength tylenol (650 mg) every 6 hours. You may take the hydrocodone prescribed if needed for additional pain relief (especially before bedtime if the pain keeps you awake).  Do not drive within 4 hours of taking hydrocodone as it will make you drowsy.  See Dr. Fredna Dow for any persistent or worsening symptoms.

## 2018-03-09 NOTE — ED Triage Notes (Signed)
Patient complains of right hand pain that started 30 minutes ago. Patient states he took a nap and woke up with extreme hand pain. He states the pain started 3 weeks ago.

## 2018-03-14 ENCOUNTER — Ambulatory Visit (INDEPENDENT_AMBULATORY_CARE_PROVIDER_SITE_OTHER): Payer: Medicare Other

## 2018-03-14 DIAGNOSIS — I639 Cerebral infarction, unspecified: Secondary | ICD-10-CM | POA: Diagnosis not present

## 2018-03-15 NOTE — Progress Notes (Signed)
Carelink Summary Report / Loop Recorder 

## 2018-03-16 LAB — CUP PACEART REMOTE DEVICE CHECK
Date Time Interrogation Session: 20200116233924
MDC IDC PG IMPLANT DT: 20190530

## 2018-03-17 LAB — CUP PACEART REMOTE DEVICE CHECK
Date Time Interrogation Session: 20191214230639
MDC IDC PG IMPLANT DT: 20190530

## 2018-04-16 ENCOUNTER — Ambulatory Visit (INDEPENDENT_AMBULATORY_CARE_PROVIDER_SITE_OTHER): Payer: Medicare Other

## 2018-04-16 DIAGNOSIS — I639 Cerebral infarction, unspecified: Secondary | ICD-10-CM | POA: Diagnosis not present

## 2018-04-18 LAB — CUP PACEART REMOTE DEVICE CHECK
Date Time Interrogation Session: 20200218233912
Implantable Pulse Generator Implant Date: 20190530

## 2018-04-24 NOTE — Progress Notes (Signed)
Carelink Summary Report / Loop Recorder 

## 2018-04-26 ENCOUNTER — Telehealth (HOSPITAL_COMMUNITY): Payer: Self-pay

## 2018-04-26 ENCOUNTER — Other Ambulatory Visit: Payer: Self-pay | Admitting: Physician Assistant

## 2018-04-26 DIAGNOSIS — R918 Other nonspecific abnormal finding of lung field: Secondary | ICD-10-CM

## 2018-05-01 ENCOUNTER — Ambulatory Visit (HOSPITAL_COMMUNITY)
Admission: RE | Admit: 2018-05-01 | Discharge: 2018-05-01 | Disposition: A | Discharge status: Home / Self Care | Payer: Medicare Other | Source: Ambulatory Visit | Attending: Physician Assistant | Admitting: Physician Assistant

## 2018-05-01 DIAGNOSIS — R918 Other nonspecific abnormal finding of lung field: Secondary | ICD-10-CM | POA: Insufficient documentation

## 2018-05-09 ENCOUNTER — Encounter: Payer: Self-pay | Admitting: Neurology

## 2018-05-09 ENCOUNTER — Other Ambulatory Visit: Payer: Self-pay

## 2018-05-09 ENCOUNTER — Ambulatory Visit (INDEPENDENT_AMBULATORY_CARE_PROVIDER_SITE_OTHER): Payer: Medicare Other | Admitting: Neurology

## 2018-05-09 VITALS — BP 177/70 | HR 56 | Wt 201.0 lb

## 2018-05-09 DIAGNOSIS — F0280 Dementia in other diseases classified elsewhere without behavioral disturbance: Secondary | ICD-10-CM | POA: Diagnosis not present

## 2018-05-09 DIAGNOSIS — R413 Other amnesia: Secondary | ICD-10-CM | POA: Diagnosis not present

## 2018-05-09 MED ORDER — DONEPEZIL HCL 5 MG PO TABS: 5.0000 mg | ORAL_TABLET | Freq: Every day | ORAL | 0 refills | Status: DC

## 2018-05-09 NOTE — Patient Instructions (Signed)
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  I recommend he continue Lexapro 20 mg daily for possible underlying depression but check memory panel labs, EEG, MRI scan of the brain and check lipid profile and hemoglobin A1c.  Trial of Aricept 5 mg daily for a month to be increased if tolerated without side effects to 10 mg daily.  I encouraged the patient to do memory compensation strategies and to participate in cognitively challenging activities like solving crossword puzzles, playing bridge and sudoku.  Continue aspirin for stroke prevention with strict control of reversible risk factors with blood pressure goal below 130/90, lipids with LDL cholesterol goal below 70 mg percent and diabetes with hemoglobin A1c goal below 6.5.  He will return for follow-up in 2 months or call earlier if necessary. Memory Compensation Strategies  1. Use "WARM" strategy.  W= write it down  A= associate it  R= repeat it  M= make a mental note  2.   You can keep a Social worker.  Use a 3-ring notebook with sections for the following: calendar, important names and phone numbers,  medications, doctors' names/phone numbers, lists/reminders, and a section to journal what you did  each day.   3.    Use a calendar to write appointments down.  4.    Write yourself a schedule for the day.  This can be placed on the calendar or in a separate section of the Memory Notebook.  Keeping a  regular schedule can help memory.  5.    Use medication organizer with sections for each day or morning/evening pills.  You may need help loading it  6.    Keep a basket, or pegboard by the door.  Place items that you need to take out with you in the basket or on the pegboard.  You may also want to  include a message board for reminders.  7.    Use sticky notes.  Place sticky  notes with reminders in a place where the task is performed.  For example: " turn off the  stove" placed by the stove, "lock the door" placed on the door at eye level, " take your medications" on  the bathroom mirror or by the place where you normally take your medications.  8.    Use alarms/timers.  Use while cooking to remind yourself to check on food or as a reminder to take your medicine, or as a  reminder to make a call, or as a reminder to perform another task, etc.

## 2018-05-09 NOTE — Progress Notes (Signed)
[facility] [address] [place]. [place] [address] [phone]       OFFICE CONSULT NOTE  [name] Date of Birth:  1940-04-14 Medical Record Number:  [id number]   Referring MD: Kathleen Lime, PA-c  Reason for Referral: Memory loss and dementia HPI: [name] is a pleasant 78 year old Caucasian male seen today for office consultation visit for memory loss.  He is accompanied by his daughter.  History is obtained from them and review of electronic medical records.  I have reviewed available imaging films in PACS.  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  The patient daughter feels that after his stroke this seems to have progressed further.  He has some good days and bad days.  Is also been having some mood swings and saw his primary care physician recently started Lexapro 10 mg daily.  There was some improvement in his mood and behavior but cognitive is with difficulties persist.  The dose of Lexapro was increased to 20 mg last week.  The patient has trouble remembering recent information and figuring things out himself.  He is still lives at home but needs increasing help with on finances and remembering appointments or information.  He does have strong family history of Alzheimer's dementia and 2 brothers and 2 sisters and that is why the daughter is worried about it.  Patient had a previous history of embolic left MCA branch infarct in May 2018 at that time he had extensive evaluation including TEE, loop recorder and so for paroxysmal A. fib has not been found.  At the my last visit I had found that he had mild cognitive impairment versus early dementia and is suggested a trial of Aricept but this was not done.  Today on the Mini-Mental status exam scored 17/30 and on the depression scale he was not found to  be significantly depressed.  His clock drawing as well as animal naming were also impaired.  He is not had any recent lab work for reversible causes of memory loss, EEG or brain imaging done.  He has not had any further recurrent stroke or TIA symptoms and remains on aspirin which is tolerating well.  Is unclear as to when his last lipid profile was checked.  His blood pressure is elevated today at 177/70 with family feels it is better controlled at home.  He does not have any agitation, violent behavior, delusions, hallucinations and unsafe behavior.  ROS:   14 system review of systems is positive for hearing loss, chest pain, shortness of breath, joint pain, frequent infections, confusion, headache, memory loss, dizziness, depression, anxiety, racing thoughts and all other systems negative  PMH:  Past Medical History:  Diagnosis Date   Cancer ([place])    skin cancer   Diabetes mellitus without complication ([place])    Hypertension     Social History:  Social History   Socioeconomic History   Marital status: Married    Spouse name: Not on file   Number of children: Not on file   Years of education: Not on file   Highest education level: Not on file  Occupational History   Not on file  Social Needs   Financial resource strain: Not on file   Food insecurity:    Worry: Not on file    Inability: Not on file  Transportation needs:    Medical: Not on file    Non-medical: Not on file  Tobacco Use   Smoking status: Never Smoker   Smokeless tobacco: Never Used  Substance and Sexual Activity   Alcohol use: Yes    Comment: occ   Drug use: No   Sexual activity: Not on file  Lifestyle   Physical activity:    Days per week: Not on file    Minutes per session: Not on file   Stress: Not on file  Relationships   Social connections:    Talks on phone: Not on file    Gets together: Not on file    Attends religious service: Not on file    Active member of club or  organization: Not on file    Attends meetings of clubs or organizations: Not on file    Relationship status: Not on file   Intimate partner violence:    Fear of current or ex partner: Not on file    Emotionally abused: Not on file    Physically abused: Not on file    Forced sexual activity: Not on file  Other Topics Concern   Not on file  Social History Narrative   Not on file    Medications:   Current Outpatient Medications on File Prior to Visit  Medication Sig Dispense Refill   amLODipine (NORVASC) 5 MG tablet Take 5 mg by mouth 2 (two) times daily.     aspirin EC 81 MG tablet Take 81 mg by mouth daily.     atorvastatin (LIPITOR) 20 MG tablet Take 2 tablets (40 mg total) by mouth daily. 60 tablet 0   escitalopram (LEXAPRO) 20 MG tablet      glipiZIDE (GLUCOTROL) 5 MG tablet Take 5 mg by mouth daily.     metFORMIN (GLUCOPHAGE) 1000 MG tablet Take 1,000 mg by mouth 2 (two) times daily.      No current facility-administered medications on file prior to visit.     Allergies:  No Known Allergies  Physical Exam General: well developed, well nourished elderly Caucasian male, seated, in no evident distress Head: head normocephalic and atraumatic.   Neck: supple with no carotid or supraclavicular bruits Cardiovascular: regular rate and rhythm, no murmurs Musculoskeletal: no deformity Skin:  no rash/petichiae Vascular:  Normal pulses all extremities  Neurologic Exam Mental Status: Awake and fully alert. Oriented to place and time. Recent and remote memory diminished. Attention span, concentration and fund of knowledge poor. Mood and affect appropriate.  Mini-Mental status exam scored 17/30 with deficits in naming, writing, orientation attention and recall.  Clock drawing 1/4.  Able to name only 5 animals with 4 legs.  Palmomental reflex absent.  No grasp reflex.  Geriatric depression scale to only Cranial Nerves: Fundoscopic exam reveals sharp disc margins. Pupils equal,  briskly reactive to light. Extraocular movements full without nystagmus. Visual fields full to confrontation. Hearing intact. Facial sensation intact. Face, tongue, palate moves normally and symmetrically.  Motor: Normal bulk and tone. Normal strength in all tested extremity muscles. Sensory.: intact to touch , pinprick , position and vibratory sensation.  Coordination: Rapid alternating movements normal in all extremities. Finger-to-nose and heel-to-shin performed accurately bilaterally. Gait and Station: Arises from chair without difficulty. Stance is normal. Gait demonstrates normal stride length and balance . Able to heel, toe and tandem walk with severe  difficulty.  Reflexes: 1+ and symmetric. Toes downgoing.       ASSESSMENT: 78 year old Caucasian male with progressive memory loss  and cognitive worsening over a year likely due to mild dementia.  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  I recommend he continue Lexapro 20 mg daily for possible underlying depression but check memory panel labs, EEG, MRI scan of the brain and check lipid profile and hemoglobin A1c.  Trial of Aricept 5 mg daily for a month to be increased if tolerated without side effects to 10 mg daily.  I encouraged the patient to do memory compensation strategies and to participate in cognitively challenging activities like solving crossword puzzles, playing bridge and sudoku.  Continue aspirin for stroke prevention with strict control of reversible risk factors with blood pressure goal below 130/90, lipids with LDL cholesterol goal below 70 mg percent and diabetes with hemoglobin A1c goal below 6.5.   Greater than 50% time during this 45-minute consultation visit was spent on counseling and coordination of care about his memory loss and dementia and answering questions.  He will return for follow-up in 2 months or call earlier if necessary. Antony Contras, MD  [facility] [address] [place], [place] [address]  Phone [phone] Fax [phone] Note: This document was prepared with digital dictation and possible smart phrase technology. Any transcriptional errors that result from this process are unintentional.

## 2018-05-11 LAB — DEMENTIA PANEL
Homocysteine: 10.7 umol/L (ref 0.0–19.2)
RPR Ser Ql: NONREACTIVE
TSH: 3.32 u[IU]/mL (ref 0.450–4.500)
Vitamin B-12: 366 pg/mL (ref 232–1245)

## 2018-05-11 LAB — LIPID PANEL
Chol/HDL Ratio: 4.4 ratio (ref 0.0–5.0)
Cholesterol, Total: 167 mg/dL (ref 100–199)
HDL: 38 mg/dL — ABNORMAL LOW (ref >39)
LDL Calculated: 96 mg/dL (ref 0–99)
Triglycerides: 164 mg/dL — ABNORMAL HIGH (ref 0–149)
VLDL Cholesterol Cal: 33 mg/dL (ref 5–40)

## 2018-05-11 LAB — HEMOGLOBIN A1C
Est. average glucose Bld gHb Est-mCnc: 154 mg/dL
Hgb A1c MFr Bld: 7 % — ABNORMAL HIGH (ref 4.8–5.6)

## 2018-05-13 ENCOUNTER — Telehealth: Payer: Self-pay | Admitting: Neurology

## 2018-05-13 NOTE — Telephone Encounter (Signed)
[employer] medicare/medicaid order sent to [facility]. No auth they will reach out to the pt to schedule.  °

## 2018-05-14 ENCOUNTER — Telehealth: Payer: Self-pay

## 2018-05-14 NOTE — Telephone Encounter (Signed)
The pts daughter [name] call in and is aware of results.She will  call the PCP to adjust the cholesterol meds.

## 2018-05-14 NOTE — Telephone Encounter (Signed)
-----   Message from Garvin Fila, MD sent at 05/12/2018 10:43 AM EDT ----- [facility] inform the patient had lab work for reversible causes of memory loss was all normal. Lipid profile shows that the bad cholesterol is not satisfactory and to increase the dose of Lipitor from 40 to 60 mg daily.kindly see his primary care physician to discuss slightly suboptimal diabetes control

## 2018-05-14 NOTE — Telephone Encounter (Signed)
Left vm for patients daughter to call back about his lab work and recommendations. Mychart message will be sent. ------

## 2018-05-16 ENCOUNTER — Telehealth: Payer: Self-pay | Admitting: Neurology

## 2018-05-16 NOTE — Telephone Encounter (Signed)
Courtney from [place] called wanting to know if the pt was moved up from 40mg  to 60mg  on his atorvastatin (LIPITOR) 20 MG tablet(Expired) Please advise.

## 2018-05-16 NOTE — Telephone Encounter (Signed)
[facility] requested labs be sent recommending lipitor to be increase to 60mg . Fax was given of [phone]. Labs fax and receive.

## 2018-05-20 ENCOUNTER — Other Ambulatory Visit: Payer: Self-pay

## 2018-05-20 ENCOUNTER — Emergency Department (HOSPITAL_COMMUNITY): Payer: Medicare Other

## 2018-05-20 ENCOUNTER — Encounter (HOSPITAL_COMMUNITY): Payer: Self-pay | Admitting: Emergency Medicine

## 2018-05-20 ENCOUNTER — Emergency Department (HOSPITAL_COMMUNITY)
Admission: EM | Admit: 2018-05-20 | Discharge: 2018-05-20 | Disposition: A | Discharge status: Home / Self Care | Payer: Medicare Other | Attending: Emergency Medicine | Admitting: Emergency Medicine

## 2018-05-20 ENCOUNTER — Ambulatory Visit (INDEPENDENT_AMBULATORY_CARE_PROVIDER_SITE_OTHER): Payer: Medicare Other | Admitting: *Deleted

## 2018-05-20 DIAGNOSIS — I10 Essential (primary) hypertension: Secondary | ICD-10-CM | POA: Diagnosis not present

## 2018-05-20 DIAGNOSIS — R531 Weakness: Secondary | ICD-10-CM | POA: Insufficient documentation

## 2018-05-20 DIAGNOSIS — Z85828 Personal history of other malignant neoplasm of skin: Secondary | ICD-10-CM | POA: Insufficient documentation

## 2018-05-20 DIAGNOSIS — E119 Type 2 diabetes mellitus without complications: Secondary | ICD-10-CM | POA: Insufficient documentation

## 2018-05-20 DIAGNOSIS — I639 Cerebral infarction, unspecified: Secondary | ICD-10-CM

## 2018-05-20 HISTORY — DX: Other amnesia: R41.3

## 2018-05-20 LAB — COMPREHENSIVE METABOLIC PANEL
ALT: 20 U/L (ref 0–44)
AST: 23 U/L (ref 15–41)
Albumin: 3.9 g/dL (ref 3.5–5.0)
Alkaline Phosphatase: 52 U/L (ref 38–126)
Anion gap: 8 (ref 5–15)
BUN: 21 mg/dL (ref 8–23)
CO2: 23 mmol/L (ref 22–32)
CREATININE: 0.89 mg/dL (ref 0.61–1.24)
Calcium: 9.5 mg/dL (ref 8.9–10.3)
Chloride: 109 mmol/L (ref 98–111)
GFR calc Af Amer: >60 mL/min (ref >60)
GFR calc non Af Amer: >60 mL/min (ref >60)
GLUCOSE: 84 mg/dL (ref 70–99)
Potassium: 3.8 mmol/L (ref 3.5–5.1)
Sodium: 140 mmol/L (ref 135–145)
Total Bilirubin: 0.4 mg/dL (ref 0.3–1.2)
Total Protein: 6.8 g/dL (ref 6.5–8.1)

## 2018-05-20 LAB — CBC
HEMATOCRIT: 45.3 % (ref 39.0–52.0)
Hemoglobin: 14.6 g/dL (ref 13.0–17.0)
MCH: 28.9 pg (ref 26.0–34.0)
MCHC: 32.2 g/dL (ref 30.0–36.0)
MCV: 89.5 fL (ref 80.0–100.0)
Platelets: 226 10*3/uL (ref 150–400)
RBC: 5.06 MIL/uL (ref 4.22–5.81)
RDW: 13 % (ref 11.5–15.5)
WBC: 9.9 10*3/uL (ref 4.0–10.5)
nRBC: 0 % (ref 0.0–0.2)

## 2018-05-20 LAB — CUP PACEART REMOTE DEVICE CHECK
Date Time Interrogation Session: 20200322234306
Implantable Pulse Generator Implant Date: 20190530

## 2018-05-20 LAB — URINALYSIS, ROUTINE W REFLEX MICROSCOPIC
Bacteria, UA: NONE SEEN
Bilirubin Urine: NEGATIVE
Glucose, UA: NEGATIVE mg/dL
Ketones, ur: NEGATIVE mg/dL
Leukocytes,Ua: NEGATIVE
Nitrite: NEGATIVE
Protein, ur: NEGATIVE mg/dL
SPECIFIC GRAVITY, URINE: 1.02 (ref 1.005–1.030)
pH: 5 (ref 5.0–8.0)

## 2018-05-20 LAB — INFLUENZA PANEL BY PCR (TYPE A & B)
Influenza A By PCR: NEGATIVE
Influenza B By PCR: NEGATIVE

## 2018-05-20 NOTE — Discharge Instructions (Addendum)
You were evaluated in the Emergency Department and after careful evaluation, we did not find any emergent condition requiring admission or further testing in the hospital.  As discussed, please isolate yourself at home to prevent infecting yourself or other people.  If you experience significantly high fevers or trouble breathing, please return to the emergency department.  Please return to the Emergency Department if you experience any worsening of your condition.  We encourage you to follow up with a primary care provider.  Thank you for allowing [place] to be a part of your care.

## 2018-05-20 NOTE — ED Provider Notes (Signed)
[facility] Emergency Department Provider Note MRN:  [id number]  Arrival date & time: 05/20/18     Chief Complaint   Weakness   History of Present Illness   Patrick Phillips is a 78 y.o. year-old male with a history of diabetes, hypertension presenting to the ED with chief complaint of weakness.  Patient endorsing several days of generalized weakness, fatigue, malaise.  Denies headache or vision change, no chest pain or shortness of breath.  Feels like he has a cold, feels hoarse, mild cough.  Denies fever.  No nasal congestion, no sore throat, no body aches.  Denies abdominal pain, no vomiting, no diarrhea.  Thinks his blood pressure might be the cause of his symptoms.  Review of Systems  A complete 10 system review of systems was obtained and all systems are negative except as noted in the HPI and PMH.   Patient's Health History    Past Medical History:  Diagnosis Date  . Cancer ([place])    skin cancer  . Diabetes mellitus without complication ([place])   . Hypertension   . Memory deficit     Past Surgical History:  Procedure Laterality Date  . BACK SURGERY    . HERNIA REPAIR    . LOOP RECORDER INSERTION N/A 07/26/2017   Procedure: LOOP RECORDER INSERTION;  Surgeon: Evans Lance, MD;  Location: [place] [facility];  Service: Cardiovascular;  Laterality: N/A;  . TEE WITHOUT CARDIOVERSION N/A 07/26/2017   Procedure: TRANSESOPHAGEAL ECHOCARDIOGRAM (TEE);  Surgeon: Fay Records, MD;  Location: [facility] ENDOSCOPY;  Service: Cardiovascular;  Laterality: N/A;  . TRIGGER FINGER RELEASE Right 12/02/2015   Procedure: RIGHT THUMB RELEASE TRIGGER FINGER/A-1 PULLEY;  Surgeon: Leanora Cover, MD;  Location: [place];  Service: Orthopedics;  Laterality: Right;    Family History  Problem Relation Age of Onset  . Cancer Mother   . Cancer Father   . Cancer Brother   . Cancer Other     Social History   Socioeconomic History  . Marital status: Married    Spouse name: Not  on file  . Number of children: Not on file  . Years of education: Not on file  . Highest education level: Not on file  Occupational History  . Not on file  Social Needs  . Financial resource strain: Not on file  . Food insecurity:    Worry: Not on file    Inability: Not on file  . Transportation needs:    Medical: Not on file    Non-medical: Not on file  Tobacco Use  . Smoking status: Never Smoker  . Smokeless tobacco: Never Used  Substance and Sexual Activity  . Alcohol use: Yes    Comment: occ  . Drug use: No  . Sexual activity: Not on file  Lifestyle  . Physical activity:    Days per week: Not on file    Minutes per session: Not on file  . Stress: Not on file  Relationships  . Social connections:    Talks on phone: Not on file    Gets together: Not on file    Attends religious service: Not on file    Active member of club or organization: Not on file    Attends meetings of clubs or organizations: Not on file    Relationship status: Not on file  . Intimate partner violence:    Fear of current or ex partner: Not on file    Emotionally abused: Not on file  Physically abused: Not on file    Forced sexual activity: Not on file  Other Topics Concern  . Not on file  Social History Narrative  . Not on file     Physical Exam  Vital Signs and Nursing Notes reviewed Vitals:   05/20/18 1400 05/20/18 1500  BP: (!) 181/77 (!) 151/57  Pulse: (!) 50 (!) 54  Resp: (!) 21 13  Temp:    SpO2: 99% 95%    CONSTITUTIONAL: Well-appearing, NAD NEURO:  Alert and oriented x 3, normal and symmetric strength and sensation, no aphasia, no neglect, no visual field cuts. EYES:  eyes equal and reactive ENT/NECK:  no LAD, no JVD CARDIO: Bradycardic rate, well-perfused, normal S1 and S2 PULM:  CTAB no wheezing or rhonchi GI/GU:  normal bowel sounds, non-distended, non-tender MSK/SPINE:  No gross deformities, no edema SKIN:  no rash, atraumatic PSYCH:  Appropriate speech and  behavior  Diagnostic and Interventional Summary    EKG Interpretation  Date/Time:  Monday May 20 2018 13:31:15 EDT Ventricular Rate:  48 PR Interval:    QRS Duration: 105 QT Interval:  479 QTC Calculation: 428 R Axis:   -32 Text Interpretation:  Sinus bradycardia Left axis deviation Probable anteroseptal infarct, old Confirmed by Gerlene Fee [phone]) on 05/20/2018 1:34:05 PM      Labs Reviewed  URINALYSIS, ROUTINE W REFLEX MICROSCOPIC - Abnormal; Notable for the following components:      Result Value   Hgb urine dipstick SMALL (*)    All other components within normal limits  CBC  COMPREHENSIVE METABOLIC PANEL  INFLUENZA PANEL BY PCR (TYPE A & B)    DG Chest 2 View  Final Result      Medications - No data to display   Procedures Critical Care  ED Course and Medical Decision Making  I have reviewed the triage vital signs and the nursing notes.  Pertinent labs & imaging results that were available during my care of the patient were reviewed by me and considered in my medical decision making (see below for details).  Vague myriad of symptoms in this 78 year old male who is well-appearing with reassuring vital signs, hypertensive but without neurological deficits, no headache, no vision change.  Given his symptoms of hoarseness and cold-like symptoms, will swab for flu, obtain labs, reassess.  Work-up is unrevealing.  Normal chest x-ray, urinalysis, labs unremarkable, flu negative.  Patient advised home isolation for 1 to 2 weeks, return precautions for shortness of breath or high fever.  PCP follow-up for hypertension.  After the discussed management above, the patient was determined to be safe for discharge.  The patient was in agreement with this plan and all questions regarding their care were answered.  ED return precautions were discussed and the patient will return to the ED with any significant worsening of condition.  Barth Kirks. Sedonia Small, MD [place] [email or web address]  Final Clinical Impressions(s) / ED Diagnoses     ICD-10-CM   1. Weakness R53.1 DG Chest 2 View    DG Chest 2 View    ED Discharge Orders    None         Maudie Flakes, MD 05/20/18 [phone]

## 2018-05-20 NOTE — ED Triage Notes (Signed)
Pt states he has been feeling bad for the past few days progressively getting worse with body aches, generalized weakness, dizziness, and cough.

## 2018-05-29 ENCOUNTER — Other Ambulatory Visit: Payer: Medicare Other

## 2018-05-29 NOTE — Progress Notes (Signed)
Carelink Summary Report / Loop Recorder 

## 2018-06-03 ENCOUNTER — Other Ambulatory Visit: Payer: Self-pay

## 2018-06-03 ENCOUNTER — Telehealth: Payer: Self-pay | Admitting: Neurology

## 2018-06-03 MED ORDER — DONEPEZIL HCL 10 MG PO TABS: 10.0000 mg | ORAL_TABLET | Freq: Every day | ORAL | 1 refills | Status: DC

## 2018-06-03 NOTE — Telephone Encounter (Signed)
I called pts daughter that pt was given rx on 05/09/2018. I stated pt should not be out of pills this early. The daughter stated she thought he might of took a extra pill but is not sure. She will check with her father today to make sure he has pills till 06/09/2018. I stated 10mg  of aricept generic will be sent today to the drug store in 90 day supply. THe daughter verbalized understanding.

## 2018-06-03 NOTE — Progress Notes (Signed)
Order for aricept 10mg  daily.

## 2018-06-03 NOTE — Telephone Encounter (Signed)
Pt's daughter [name] on [place] called in stating that the pt has already done a month on the donepezil (ARICEPT) 5 MG tablet and is ready to got to the 10mg . Daughter states that the pharmacy, [facility],  informed her that if they get an RX for the medication for 10mg  called in for a 90 day supply it would be cheaper for the pt. Please advise.

## 2018-06-04 ENCOUNTER — Ambulatory Visit
Admission: RE | Admit: 2018-06-04 | Discharge: 2018-06-04 | Disposition: A | Discharge status: Home / Self Care | Payer: Medicare Other | Source: Ambulatory Visit | Attending: Neurology | Admitting: Neurology

## 2018-06-04 ENCOUNTER — Other Ambulatory Visit: Payer: Self-pay

## 2018-06-04 DIAGNOSIS — R413 Other amnesia: Secondary | ICD-10-CM

## 2018-06-04 MED ORDER — GADOBENATE DIMEGLUMINE 529 MG/ML IV SOLN
20.0000 mL | Freq: Once | INTRAVENOUS | Status: AC | PRN
  Administered 2018-06-04: 20 mL via INTRAVENOUS

## 2018-06-21 ENCOUNTER — Ambulatory Visit (INDEPENDENT_AMBULATORY_CARE_PROVIDER_SITE_OTHER): Payer: Medicare Other | Admitting: *Deleted

## 2018-06-21 ENCOUNTER — Other Ambulatory Visit: Payer: Self-pay

## 2018-06-21 DIAGNOSIS — I639 Cerebral infarction, unspecified: Secondary | ICD-10-CM | POA: Diagnosis not present

## 2018-06-24 LAB — CUP PACEART REMOTE DEVICE CHECK
Date Time Interrogation Session: 20200424234000
Implantable Pulse Generator Implant Date: 20190530

## 2018-06-28 NOTE — Progress Notes (Signed)
Carelink Summary Report / Loop Recorder 

## 2018-07-01 ENCOUNTER — Other Ambulatory Visit: Payer: Medicare Other

## 2018-07-23 ENCOUNTER — Telehealth: Payer: Self-pay

## 2018-07-23 NOTE — Telephone Encounter (Signed)
I called pts daughter Patrick Phillips that office visit will be video due to [place] 19 pandemic. I receive verbal consent to do video and to file insurance.  Patrick Phillips has a straight talk galaxy phone and [employer] is her carrier. She confirmed the email in the system I state a email and text will be sent to her couple days prior to the visit. Patrick Phillips verbalized understanding.

## 2018-07-24 ENCOUNTER — Ambulatory Visit (INDEPENDENT_AMBULATORY_CARE_PROVIDER_SITE_OTHER): Payer: Medicare Other | Admitting: *Deleted

## 2018-07-24 DIAGNOSIS — I639 Cerebral infarction, unspecified: Secondary | ICD-10-CM

## 2018-07-25 LAB — CUP PACEART REMOTE DEVICE CHECK
Date Time Interrogation Session: 20200528000927
Implantable Pulse Generator Implant Date: 20190530

## 2018-07-29 ENCOUNTER — Ambulatory Visit (INDEPENDENT_AMBULATORY_CARE_PROVIDER_SITE_OTHER): Payer: Medicare Other | Admitting: Neurology

## 2018-07-29 ENCOUNTER — Encounter: Payer: Self-pay | Admitting: Neurology

## 2018-07-29 ENCOUNTER — Other Ambulatory Visit: Payer: Self-pay

## 2018-07-29 DIAGNOSIS — F0150 Vascular dementia without behavioral disturbance: Secondary | ICD-10-CM

## 2018-07-29 MED ORDER — MEMANTINE HCL 28 X 5 MG & 21 X 10 MG PO TABS: ORAL_TABLET | ORAL | 12 refills | Status: DC

## 2018-07-29 MED ORDER — ATORVASTATIN CALCIUM 20 MG PO TABS: 60.0000 mg | ORAL_TABLET | Freq: Every day | ORAL | 1 refills | Status: DC

## 2018-07-29 NOTE — Progress Notes (Signed)
Virtual Visit via Video Note  I connected with Patrick Phillips on 07/29/18 at 11:30 AM EDT by a video enabled telemedicine application and verified that I am speaking with the correct person using two identifiers.  Location: Patient: at his home with daughter Provider: at [facility] office  I discussed the limitations of evaluation and management by telemedicine and the availability of in person appointments. The patient expressed understanding and agreed to proceed.  This visit was performed using [email or web address] app for video and audio.  The patient's daughter was present with him throughout this visit and facilitated the visit.  History of Present Illness: Patrick Phillips is seen for follow-up after his last visit with me on 05/09/2018.  He states he is doing well and has not noticed any new changes.  He has been able to tolerate Aricept and is presently taking 10 mg daily without any GI or CNS side effects.  The daughter feels he may get intermittently agitated on the 10 mg dose and was not doing so on the 5 mg dose earlier.  They did notice some cognitive improvement initially but that seems to have leveled off.  He does get intermittently agitated but can be redirected.  He has not had any violent behavior, delusions or hallucinations.  Is mostly independent in most activities of daily living.  He can feed himself dress himself.  He is still driving.  He has not had any recurrent stroke or TIA symptoms.  He had MRI scan of the brain done on 06/04/2018 which I personally reviewed shows changes of small vessel disease and mild generalized atrophy and no acute abnormality.  Lab work done on 05/09/2018 showed elevated LDL cholesterol of 96 mg percent and hemoglobin A1c was 7.0.  Patient's Lipitor dose was increased from 40 to 60 mg following these lab results and is tolerating it well without muscle aches or pains.  An EEG was ordered but has not been done due to constraints from the [place] pandemic.  He has no new complaints today.  He has a loop recorder and so for paroxysmal A. fib has not yet been found.  He states his blood pressure and diabetes have been under good control. Observations/Objective: Physical and neurological exams are limited due to constraints from virtual video visit.  Pleasant elderly Caucasian male currently not in distress.  He is awake alert.  He is oriented to time place and person.  He has diminished attention, registration.  Recall is 0/3.  He is able to name 7 animals which walk on 4 legs.  There is no aphasia apraxia or dysarthria.  Extraocular movements appear full range without nystagmus.  Face is symmetric without weakness.  Tongue is midline.  He is able to get up easily.  No focal extremity weakness.  Gait is steady.  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  Vascular risk factors of diabetes, hypertension and prior stroke. I had a long discussion with the patient and his daughter regarding his progressive memory loss and cognitive difficulties which likely represent mild early dementia which is mixed vascular and Alzheimer's type.  He has shown only mild improvement on Aricept but is having side effect of agitation hence will not increase the dose further.  I recommend adding  Namenda titration pack to see if he gets further benefit.  I also encouraged him to participate in cognitively challenging activities like solving crossword puzzles, playing bridge and sudoku and do memory compensation strategies.  Continue aspirin for stroke prevention and strict control of diabetes with hemoglobin A1c goal below 6.5, LDL cholesterol goal below 70 mg percent and blood pressure goal below 130/90.  Follow Up Instructions:  Check EEG. Start Namenda  titration pack. He will return for follow-up in the future for in person visit in 4 weeks for more detailed cognitive testing or call earlier if necessary.    I discussed the assessment and treatment plan with the patient. The patient was provided an opportunity to ask questions and all were answered. The patient agreed with the plan and demonstrated an understanding of the instructions.   The patient was advised to call back or seek an in-person evaluation if the symptoms worsen or if the condition fails to improve as anticipated.  I provided 25 and fax it in minutes of non-face-to-face time during this encounter.   Antony Contras, MD

## 2018-08-05 NOTE — Progress Notes (Signed)
Carelink Summary Report / Loop Recorder 

## 2018-08-19 ENCOUNTER — Other Ambulatory Visit: Payer: Self-pay

## 2018-08-19 ENCOUNTER — Ambulatory Visit (INDEPENDENT_AMBULATORY_CARE_PROVIDER_SITE_OTHER): Payer: Medicare Other

## 2018-08-19 DIAGNOSIS — R410 Disorientation, unspecified: Secondary | ICD-10-CM | POA: Diagnosis not present

## 2018-08-19 DIAGNOSIS — F0150 Vascular dementia without behavioral disturbance: Secondary | ICD-10-CM

## 2018-08-26 ENCOUNTER — Ambulatory Visit (INDEPENDENT_AMBULATORY_CARE_PROVIDER_SITE_OTHER): Payer: Medicare Other | Admitting: *Deleted

## 2018-08-26 DIAGNOSIS — R55 Syncope and collapse: Secondary | ICD-10-CM

## 2018-08-26 DIAGNOSIS — I639 Cerebral infarction, unspecified: Secondary | ICD-10-CM | POA: Diagnosis not present

## 2018-08-27 LAB — CUP PACEART REMOTE DEVICE CHECK
Date Time Interrogation Session: 20200630003900
Implantable Pulse Generator Implant Date: 20190530

## 2018-08-30 ENCOUNTER — Other Ambulatory Visit: Payer: Self-pay | Admitting: Neurology

## 2018-09-04 NOTE — Progress Notes (Signed)
Carelink Summary Report / Loop Recorder 

## 2018-09-29 LAB — CUP PACEART REMOTE DEVICE CHECK
Date Time Interrogation Session: 20200802014029
Implantable Pulse Generator Implant Date: 20190530

## 2018-09-30 ENCOUNTER — Ambulatory Visit (INDEPENDENT_AMBULATORY_CARE_PROVIDER_SITE_OTHER): Payer: Medicare Other | Admitting: *Deleted

## 2018-09-30 DIAGNOSIS — I639 Cerebral infarction, unspecified: Secondary | ICD-10-CM

## 2018-10-09 NOTE — Progress Notes (Signed)
Carelink Summary Report / Loop Recorder 

## 2018-10-24 ENCOUNTER — Ambulatory Visit: Payer: Medicare Other | Admitting: Neurology

## 2018-10-31 ENCOUNTER — Ambulatory Visit (INDEPENDENT_AMBULATORY_CARE_PROVIDER_SITE_OTHER): Payer: Medicare Other | Admitting: *Deleted

## 2018-10-31 DIAGNOSIS — I639 Cerebral infarction, unspecified: Secondary | ICD-10-CM

## 2018-11-01 LAB — CUP PACEART REMOTE DEVICE CHECK
Date Time Interrogation Session: 20200904014045
Implantable Pulse Generator Implant Date: 20190530

## 2018-11-05 ENCOUNTER — Ambulatory Visit (INDEPENDENT_AMBULATORY_CARE_PROVIDER_SITE_OTHER): Payer: Medicare Other | Admitting: Neurology

## 2018-11-05 ENCOUNTER — Encounter: Payer: Self-pay | Admitting: Neurology

## 2018-11-05 ENCOUNTER — Other Ambulatory Visit: Payer: Self-pay

## 2018-11-05 VITALS — BP 130/76 | HR 86 | Temp 98.0°F | Wt 194.6 lb

## 2018-11-05 DIAGNOSIS — R413 Other amnesia: Secondary | ICD-10-CM

## 2018-11-05 NOTE — Patient Instructions (Addendum)
I had a long d/w with patient and his daughter regarding his dementia and answered questions.  I recommend he continue Namenda 10 mg twice daily and switch the Aricept to 10 mg in the morning as this is having some agitation which the family feels is related to Aricept at night.  He will continue aspirin for stroke prevention and maintain strict control of hypertension with blood pressure goal below 130/90, lipids with LDL cholesterol goal below 70 mg percent and diabetes with hemoglobin A1c goal below 6.5%.  Continue Lexapro for depression and follow-up with primary care physician for the same.  He will return for follow-up in the future in 3 months with my nurse practitioner Janett Billow or call earlier if necessary.

## 2018-11-05 NOTE — Progress Notes (Signed)
[facility] [address] [place]. [place] [address] [phone]       OFFICE FOLLOW UP VISIT NOTE  [name] Date of Birth:  [birth year] Medical Record Number:  [id number]   Referring MD: Kathleen Lime, PA-c  Reason for Referral: Memory loss and dementia [id number] visit 05/09/2018 : [name] is a pleasant 78 year old Caucasian male seen today for office consultation visit for memory loss.  He is accompanied by his daughter.  History is obtained from them and review of electronic medical records.  I have reviewed available imaging films in PACS.  Mr. 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  The patient daughter feels that after his stroke this seems to have progressed further.  He has some good days and bad days.  Is also been having some mood swings and saw his primary care physician recently started Lexapro 10 mg daily.  There was some improvement in his mood and behavior but cognitive is with difficulties persist.  The dose of Lexapro was increased to 20 mg last week.  The patient has trouble remembering recent information and figuring things out himself.  He is still lives at home but needs increasing help with on finances and remembering appointments or information.  He does have strong family history of Alzheimer's dementia and 2 brothers and 2 sisters and that is why the daughter is worried about it.  Patient had a previous history of embolic left MCA branch infarct in May 2018 at that time he had extensive evaluation including TEE, loop recorder and so for paroxysmal A. fib has not been found.  At the my last visit I had found that he had mild cognitive impairment versus early dementia and is suggested a trial of Aricept but this was not done.  Today on the Mini-Mental status exam scored 17/30 and on the  depression scale he was not found to be significantly depressed.  His clock drawing as well as animal naming were also impaired.  He is not had any recent lab work for reversible causes of memory loss, EEG or brain imaging done.  He has not had any further recurrent stroke or TIA symptoms and remains on aspirin which is tolerating well.  Is unclear as to when his last lipid profile was checked.  His blood pressure is elevated today at 177/70 with family feels it is better controlled at home.  He does not have any agitation, violent behavior, delusions, hallucinations and unsafe behavior. Virtual video visit 07/29/2018: [name] is seen for follow-up after his last visit with me on 05/09/2018.  He states he is doing well and has not noticed any new changes.  He has been able to tolerate Aricept and is presently taking 10 mg daily without any GI or CNS side effects.  The daughter feels he may get intermittently agitated on the 10 mg dose and was not doing so on the 5 mg dose earlier.  They did notice some cognitive improvement initially but that seems to have leveled off.  He does get intermittently agitated but can be redirected.  He has not had any violent behavior, delusions or hallucinations.  Is mostly independent in most activities of daily living.  He can feed himself dress himself.  He is still driving.  He has not had any recurrent stroke or TIA symptoms.  He  had MRI scan of the brain done on 06/04/2018 which I personally reviewed shows changes of small vessel disease and mild generalized atrophy and no acute abnormality.  Lab work done on 05/09/2018 showed elevated LDL cholesterol of 96 mg percent and hemoglobin A1c was 7.0.  Patient's Lipitor dose was increased from 40 to 60 mg following these lab results and is tolerating it well without muscle aches or pains.  An EEG was ordered but has not been done due to constraints from the [place] pandemic.  He has no new complaints today. He has a loop recorder and so  for paroxysmal A. fib has not yet been found.  He states his blood pressure and diabetes have been under good control. Update 11/05/2018 ; he returns for f/u after last virtual video visit 07/29/18.  Patient is accompanied by his daughter.  The patient is tolerating Aricept well but his caregiver friend who lives with him feels that he gets upset at night and this may be related to the medication.  He had an EEG done following last visit on 08/26/2018 which was normal.  He continues to have mild cognitive difficulties but these are unchanged.  Is mostly independent and does a lot of work in his house and outdoors.  He has not had any recurrent stroke or TIA symptoms.  Loop recorder assessment from last week was negative for A. fib.  Is tolerating Lipitor well without muscle aches and pains.  Blood pressure is adequately controlled.  No new complaints.  No delusions, hallucinations or unsafe behavior ROS:   14 system review of systems is positive for hearing loss, chest pain, shortness of breath, joint pain, frequent infections, confusion, headache, memory loss, dizziness, depression, anxiety, racing thoughts and all other systems negative  PMH:  Past Medical History:  Diagnosis Date   Cancer ([place])    skin cancer   Diabetes mellitus without complication ([place])    Hypertension    Memory deficit     Social History:  Social History   Socioeconomic History   Marital status: Married    Spouse name: Not on file   Number of children: Not on file   Years of education: Not on file   Highest education level: Not on file  Occupational History   Not on file  Social Needs   Financial resource strain: Not on file   Food insecurity    Worry: Not on file    Inability: Not on file   Transportation needs    Medical: Not on file    Non-medical: Not on file  Tobacco Use   Smoking status: Never Smoker   Smokeless tobacco: Never Used  Substance and Sexual Activity   Alcohol use: Yes     Comment: occ   Drug use: No   Sexual activity: Not on file  Lifestyle   Physical activity    Days per week: Not on file    Minutes per session: Not on file   Stress: Not on file  Relationships   Social connections    Talks on phone: Not on file    Gets together: Not on file    Attends religious service: Not on file    Active member of club or organization: Not on file    Attends meetings of clubs or organizations: Not on file    Relationship status: Not on file   Intimate partner violence    Fear of current or ex partner: Not on file    Emotionally abused:  Not on file    Physically abused: Not on file    Forced sexual activity: Not on file  Other Topics Concern   Not on file  Social History Narrative   Not on file    Medications:   Current Outpatient Medications on File Prior to Visit  Medication Sig Dispense Refill   amLODipine (NORVASC) 5 MG tablet Take 5 mg by mouth 2 (two) times daily.     aspirin EC 81 MG tablet Take 81 mg by mouth daily.     donepezil (ARICEPT) 10 MG tablet Take 1 tablet (10 mg total) by mouth at bedtime. 90 tablet 1   donepezil (ARICEPT) 5 MG tablet Take 1 tablet (5 mg total) by mouth at bedtime. Start 1 tablet daily x 1 month and then 2 tablets daily 30 tablet 0   escitalopram (LEXAPRO) 20 MG tablet      glipiZIDE (GLUCOTROL) 5 MG tablet Take 5 mg by mouth daily.     MEMANTINE HCL PO Use as directed 10 mg in the mouth or throat 2 (two) times daily.     metFORMIN (GLUCOPHAGE) 1000 MG tablet Take 1,000 mg by mouth 2 (two) times daily.      atorvastatin (LIPITOR) 20 MG tablet Take 3 tablets (60 mg total) by mouth daily for 30 days. 90 tablet 1   No current facility-administered medications on file prior to visit.     Allergies:  No Known Allergies  Physical Exam General: well developed, well nourished elderly Caucasian male, seated, in no evident distress Head: head normocephalic and atraumatic.   Neck: supple with no carotid or  supraclavicular bruits Cardiovascular: regular rate and rhythm, no murmurs Musculoskeletal: no deformity Skin:  no rash/petichiae Vascular:  Normal pulses all extremities  Neurologic Exam Mental Status: Awake and fully alert. Oriented to place and time. Recent and remote memory diminished. Attention span, concentration and fund of knowledge poor. Mood and affect appropriate.  Mini-Mental status exam scored 17/30 with deficits in naming, writing, orientation attention and recall.  Clock drawing 2/4.  Able to name only 7 animals with 4 legs.  Palmomental reflex absent.  No grasp reflex.  Geriatric depression scale 1 only Cranial Nerves: Fundoscopic exam reveals sharp disc margins. Pupils equal, briskly reactive to light. Extraocular movements full without nystagmus. Visual fields full to confrontation. Hearing intact. Facial sensation intact. Face, tongue, palate moves normally and symmetrically.  Motor: Normal bulk and tone. Normal strength in all tested extremity muscles. Sensory.: intact to touch , pinprick , position and vibratory sensation.  Coordination: Rapid alternating movements normal in all extremities. Finger-to-nose and heel-to-shin performed accurately bilaterally. Gait and Station: Arises from chair without difficulty. Stance is normal. Gait demonstrates normal stride length and balance . Able to heel, toe and tandem walk with severe  difficulty.  Reflexes: 1+ and symmetric. Toes downgoing.       ASSESSMENT: 78 year old Caucasian male with progressive memory loss and cognitive worsening over a year likely due to mild dementia.  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  Vascular risk factors of diabetes, hypertension and prior stroke    PLAN:  I had a long d/w with patient and his daughter regarding his dementia and answered questions.  I recommend he continue Namenda 10 mg twice daily and switch the Aricept to 10 mg in the morning as  this is having some agitation which the family feels is related to Aricept at night.  He will continue aspirin for  stroke prevention and maintain strict control of hypertension with blood pressure goal below 130/90, lipids with LDL cholesterol goal below 70 mg percent and diabetes with hemoglobin A1c goal below 6.5%.  Continue Lexapro for depression and follow-up with primary care physician for the same.  He will return for follow-up in the future in 3 months with my nurse practitioner Janett Billow or call earlier if necessary.  Greater than 50% time during this 45-minute consultation visit was spent on counseling and coordination of care about his memory loss and dementia and answering questions.    Antony Contras, MD  [facility] [address] [place], [place] [address]  Phone [phone] Fax [phone] Note: This document was prepared with digital dictation and possible smart phrase technology. Any transcriptional errors that result from this process are unintentional.

## 2018-11-08 ENCOUNTER — Telehealth: Payer: Self-pay

## 2018-11-08 NOTE — Telephone Encounter (Signed)
Left message for patient to remind of missed remote transmission.  

## 2018-11-14 NOTE — Progress Notes (Signed)
Carelink Summary Report / Loop Recorder 

## 2018-12-03 ENCOUNTER — Ambulatory Visit (INDEPENDENT_AMBULATORY_CARE_PROVIDER_SITE_OTHER): Payer: Medicare Other | Admitting: *Deleted

## 2018-12-03 DIAGNOSIS — I639 Cerebral infarction, unspecified: Secondary | ICD-10-CM

## 2018-12-04 LAB — CUP PACEART REMOTE DEVICE CHECK
Date Time Interrogation Session: 20201007013920
Implantable Pulse Generator Implant Date: 20190530

## 2018-12-12 NOTE — Progress Notes (Signed)
Carelink Summary Report / Loop Recorder 

## 2018-12-16 ENCOUNTER — Telehealth: Payer: Self-pay

## 2018-12-16 NOTE — Telephone Encounter (Signed)
[place], was given information concerning patient and lack of cell signal at his home. Explained the confusion concerning manual remotes received and that family was upset with the situation. Asked if options are available for this patient with no cell signal to monitor from home.  If patient has internet or computer then may be able to use router in the home to connect to monitor.  If no internet and computer capabilities then will need to consult Dr Lovena Le and set up consistent manual transmission schedule to monitor.

## 2018-12-16 NOTE — Telephone Encounter (Addendum)
Daughter, Patrick Phillips, asked to speak to nurse in device clinic. She was called to educate that remote transmissions did not need to be sent because 3 manual transmissions had been received from 10/6 to 12/14/18 and Patrick Phillips was unaware that the monitor was not at his house or that he cannot connect to the monitor at home.. Daughter interrupted explaination concerning transmission frequency and informed me that no one was monitoring her father's device at night because he has no cell signal and no connection with monitor in any room and was angry because she was now being told not monitor him at all. Attempted to explain that without cell signal the remote monitor will not connect with the device but the only information at the time of today's call was that multiple transmissions were sent that would not be necessary if his monitor was connecting to him nightly, I explained that remote transmissions had been received and reviewed. Patrick Phillips continued to express her unhappiness with her father's monitoring and care regarding the loop recorder and I was unable to complete explainations of situation concerning how the  monitor works or that I understood now that his monitor would not connect at home and why so many transmissions were sent remotely because she continued to interrupt to express her concerns. Asked family to allow me to attempt to explain options and resources for care of her father. Attempted to do service recovery and explained I would contact [employer] to see what options were available to do home monitoring. Patrick Phillips continued to express that she did not  trust that her father was being cared for appropriately and that the monitoring was a "racket" and he should just unplug monitor.  I did ask family to allow me to explain monitoring situation tand she continued to interrupt and call staff dumb and fools because her father is not monitored every night. Explained that without a cell signal at her Father's home  then the only way at this time to ensure nightly remote monitoring was if he lived with her or at a place where he has cell service.  Disconnected from [place] so called back and she informed me I did not get disconnected and that she hung up the phone.She did not want any help from [employer] because they had been any  help through [facility] and that she didn't want to talk to anyone because," we had no idea what we were doing" at the office. Would like to meet with Dr Lovena Le about monitor.Marland Kitchen

## 2018-12-16 NOTE — Telephone Encounter (Signed)
The pt daughter states the pt monitor only works at her house. They send a transmission every time he visit her to make sure we get something. The pt daughter do not appreciate that we are calling her to tell her that the monitor needs to be at his house and that I should not be calling her telling her to not sending manual transmission. I let her talk with the device tech nurse [place], [place].

## 2018-12-17 NOTE — Telephone Encounter (Signed)
Dr Lovena Le made aware that patient is unable to do remote monitoring from his home due to lack of cell signal.  Ok with patient sending manual transmissions from his daughter's home or coming in to office every 3 months to have ILR checked in clinic.  Aware that we will assess if patient has internet capabilities and is open to new [employer] technology that may allow home monitoring.   If patient elects to discontinue monitoring then risks of no longer being monitored he will be educated on benefits of remote monitoring.

## 2018-12-18 NOTE — Telephone Encounter (Signed)
No additional recs.

## 2019-01-06 ENCOUNTER — Ambulatory Visit (INDEPENDENT_AMBULATORY_CARE_PROVIDER_SITE_OTHER): Payer: Medicare Other | Admitting: *Deleted

## 2019-01-06 DIAGNOSIS — R55 Syncope and collapse: Secondary | ICD-10-CM | POA: Diagnosis not present

## 2019-01-07 LAB — CUP PACEART REMOTE DEVICE CHECK
Date Time Interrogation Session: 20201109014049
Implantable Pulse Generator Implant Date: 20190530

## 2019-01-13 ENCOUNTER — Telehealth: Payer: Self-pay

## 2019-01-13 NOTE — Telephone Encounter (Signed)
Unable to speak with patient regarding disconnected monitor 

## 2019-02-04 NOTE — Progress Notes (Signed)
Carelink Summary Report / Loop Recorder 

## 2019-02-07 ENCOUNTER — Ambulatory Visit (INDEPENDENT_AMBULATORY_CARE_PROVIDER_SITE_OTHER): Payer: Medicare Other | Admitting: *Deleted

## 2019-02-07 DIAGNOSIS — I639 Cerebral infarction, unspecified: Secondary | ICD-10-CM

## 2019-02-09 LAB — CUP PACEART REMOTE DEVICE CHECK
Date Time Interrogation Session: 20201211204425
Implantable Pulse Generator Implant Date: 20190530

## 2019-03-04 ENCOUNTER — Ambulatory Visit: Payer: Self-pay | Admitting: Adult Health

## 2019-03-12 ENCOUNTER — Ambulatory Visit (INDEPENDENT_AMBULATORY_CARE_PROVIDER_SITE_OTHER): Payer: Medicare Other | Admitting: *Deleted

## 2019-03-12 DIAGNOSIS — I639 Cerebral infarction, unspecified: Secondary | ICD-10-CM

## 2019-03-13 ENCOUNTER — Telehealth: Payer: Self-pay

## 2019-03-13 LAB — CUP PACEART REMOTE DEVICE CHECK
Date Time Interrogation Session: 20210113223109
Implantable Pulse Generator Implant Date: 20190530

## 2019-03-13 NOTE — Telephone Encounter (Signed)
PA done on cover my med for atorvastatin 60mg .[facility] is reviewing your PA request. Typically an electronic response will be received within 72 hours. To check for an update later, open this request from your dashboard. You may close this dialog and return to your dashboard to perform other tasks

## 2019-03-13 NOTE — Telephone Encounter (Signed)
Fax receive from [facility] at [phone]. Atorvastatin 20mg  at 3  Tablets approve till 02/27/2020.TK:[id number]

## 2019-03-16 LAB — CUP PACEART REMOTE DEVICE CHECK
Date Time Interrogation Session: 20210116172051
Implantable Pulse Generator Implant Date: 20190530

## 2019-04-02 ENCOUNTER — Ambulatory Visit: Payer: Self-pay | Admitting: Adult Health

## 2019-04-10 ENCOUNTER — Ambulatory Visit (INDEPENDENT_AMBULATORY_CARE_PROVIDER_SITE_OTHER): Payer: Medicare Other | Admitting: Adult Health

## 2019-04-10 ENCOUNTER — Other Ambulatory Visit: Payer: Self-pay

## 2019-04-10 ENCOUNTER — Encounter: Payer: Self-pay | Admitting: Adult Health

## 2019-04-10 VITALS — BP 138/68 | HR 53 | Temp 97.7°F | Ht 72.0 in | Wt 200.2 lb

## 2019-04-10 DIAGNOSIS — E785 Hyperlipidemia, unspecified: Secondary | ICD-10-CM | POA: Diagnosis not present

## 2019-04-10 DIAGNOSIS — E119 Type 2 diabetes mellitus without complications: Secondary | ICD-10-CM | POA: Diagnosis not present

## 2019-04-10 DIAGNOSIS — I10 Essential (primary) hypertension: Secondary | ICD-10-CM

## 2019-04-10 DIAGNOSIS — Z8673 Personal history of transient ischemic attack (TIA), and cerebral infarction without residual deficits: Secondary | ICD-10-CM

## 2019-04-10 DIAGNOSIS — F0150 Vascular dementia without behavioral disturbance: Secondary | ICD-10-CM | POA: Diagnosis not present

## 2019-04-10 MED ORDER — DONEPEZIL HCL 5 MG PO TABS: 5.0000 mg | ORAL_TABLET | Freq: Every day | ORAL | 6 refills | Status: DC

## 2019-04-10 MED ORDER — MEMANTINE HCL 10 MG PO TABS: 10.0000 mg | ORAL_TABLET | Freq: Two times a day (BID) | ORAL | 5 refills | Status: DC

## 2019-04-10 NOTE — Progress Notes (Signed)
I agree with the above plan 

## 2019-04-10 NOTE — Patient Instructions (Addendum)
Your Plan:  Restart Aricept 5 mg daily and continue namenda 10mg  twice daily  Ensure you continue to stay active and eat a healthy diet  Continue aspirin 81mg  daily and lipitor 20mg  daily for secondary stroke prevention   Follow up in 6 months or call earlier if needed        Thank you for coming to see [place] at [facility]. I hope we have been able to provide you high quality care today.  You may receive a patient satisfaction survey over the next few weeks. We would appreciate your feedback and comments so that we may continue to improve ourselves and the health of our patients.

## 2019-04-10 NOTE — Progress Notes (Signed)
[facility] [address] [place]. [place] [address] [phone]       OFFICE FOLLOW UP VISIT NOTE  [name] Date of Birth:  1941/01/26 Medical Record Number:  [id number]   Referring MD: Kathleen Lime, PA-c [place] provider: Dr. Leonie Man PCP: Dione Housekeeper, MD   Reason for Referral: Memory loss and dementia  Chief Complaint  Patient presents with  . Follow-up    Rm9. With daughter. States that he gets irritable on the 10 mg Donepezil.       HPI:  Update 04/10/2019: Patrick Phillips is a 79 year old male who is being seen today for cognitive impairment follow-up.  He was previously seen in on 11/05/2018 with Dr. Leonie Man.  Residual deficits include mild cognitive impairment which has been slightly worsening since prior visit but daughter discontinued use of Aricept due to concerns of increased irritation with 10 mg dose.  MMSE today 13/30 with prior 17/30.  Denies any related behaviors such as agitation, paranoia or hallucinations.  She is questioning restarting Aricept at 5 mg dosage as he was doing very well on that dose in addition to Namenda 10 mg twice daily.  Daughter also notes decreased hearing, low education level and does not routinely pay attention to correct dates which she believes impacts MMSE.  He does endorse continuing to be active working on cars and doing things around his home and has not had any difficulty.  Continues on aspirin 81 mg daily and atorvastatin 20 mg daily for secondary stroke prevention without side effects.  Blood pressure today initially elevated but on recheck 138/68.  Loop recorder has not shown atrial fibrillation thus far.  No further concerns at this time.     History copied for reference purposes only Initial visit 05/09/2018 PS : Patrick Phillips is a pleasant 79 year old Caucasian male seen today for office consultation visit for memory loss.  He is accompanied by his daughter.  History is obtained from them and review of electronic medical  records.  I have reviewed available imaging films in PACS.  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  The patient daughter feels that after his stroke this seems to have progressed further.  He has some good days and bad days.  Is also been having some mood swings and saw his primary care physician recently started Lexapro 10 mg daily.  There was some improvement in his mood and behavior but cognitive is with difficulties persist.  The dose of Lexapro was increased to 20 mg last week.  The patient has trouble remembering recent information and figuring things out himself.  He is still lives at home but needs increasing help with on finances and remembering appointments or information.  He does have strong family history of Alzheimer's dementia and 2 brothers and 2 sisters and that is why the daughter is worried about it.  Patient had a previous history of embolic left MCA branch infarct in May 2018 at that time he had extensive evaluation including TEE, loop recorder and so for paroxysmal A. fib has not been found.  At the my last visit I had found that he had mild cognitive impairment versus early dementia and is suggested a trial of Aricept but this was not done.  Today on the Mini-Mental status exam scored 17/30 and on the depression scale he was not found to  be significantly depressed.  His clock drawing as well as animal naming were also impaired.  He is not had any recent lab work for reversible causes of memory loss, EEG or brain imaging done.  He has not had any further recurrent stroke or TIA symptoms and remains on aspirin which is tolerating well.  Is unclear as to when his last lipid profile was checked.  His blood pressure is elevated today at 177/70 with family feels it is better controlled at home.  He does not have any agitation,  violent behavior, delusions, hallucinations and unsafe behavior. Virtual video visit 07/29/2018 PS: Patrick Phillips is seen for follow-up after his last visit with me on 05/09/2018.  He states he is doing well and has not noticed any new changes.  He has been able to tolerate Aricept and is presently taking 10 mg daily without any GI or CNS side effects.  The daughter feels he may get intermittently agitated on the 10 mg dose and was not doing so on the 5 mg dose earlier.  They did notice some cognitive improvement initially but that seems to have leveled off.  He does get intermittently agitated but can be redirected.  He has not had any violent behavior, delusions or hallucinations.  Is mostly independent in most activities of daily living.  He can feed himself dress himself.  He is still driving.  He has not had any recurrent stroke or TIA symptoms.  He had MRI scan of the brain done on 06/04/2018 which I personally reviewed shows changes of small vessel disease and mild generalized atrophy and no acute abnormality.  Lab work done on 05/09/2018 showed elevated LDL cholesterol of 96 mg percent and hemoglobin A1c was 7.0.  Patient's Lipitor dose was increased from 40 to 60 mg following these lab results and is tolerating it well without muscle aches or pains.  An EEG was ordered but has not been done due to constraints from the [place] pandemic.  He has no new complaints today. He has a loop recorder and so for paroxysmal A. fib has not yet been found.  He states his blood pressure and diabetes have been under good control. Update 11/05/2018 PS ; he returns for f/u after last virtual video visit 07/29/18.  Patient is accompanied by his daughter.  The patient is tolerating Aricept well but his caregiver friend who lives with him feels that he gets upset at night and this may be related to the medication.  He had an EEG done following last visit on 08/26/2018 which was normal.  He continues to have mild cognitive difficulties but  these are unchanged.  Is mostly independent and does a lot of work in his house and outdoors.  He has not had any recurrent stroke or TIA symptoms.  Loop recorder assessment from last week was negative for A. fib.  Is tolerating Lipitor well without muscle aches and pains.  Blood pressure is adequately controlled.  No new complaints.  No delusions, hallucinations or unsafe behavior     ROS:   14 system review of systems is positive for hearing loss, memory loss, confusion and all other systems negative  PMH:  Past Medical History:  Diagnosis Date  . Cancer (English)    skin cancer  . Diabetes mellitus without complication ([place])   . Hypertension   . Memory deficit     Social History:  Social History   Socioeconomic History  . Marital status: Married    Spouse name:  Not on file  . Number of children: Not on file  . Years of education: Not on file  . Highest education level: Not on file  Occupational History  . Not on file  Tobacco Use  . Smoking status: Never Smoker  . Smokeless tobacco: Never Used  Substance and Sexual Activity  . Alcohol use: Yes    Comment: occ  . Drug use: No  . Sexual activity: Not on file  Other Topics Concern  . Not on file  Social History Narrative  . Not on file   Social Determinants of Health   Financial Resource Strain:   . Difficulty of Paying Living Expenses: Not on file  Food Insecurity:   . Worried About Charity fundraiser in the Last Year: Not on file  . Ran Out of Food in the Last Year: Not on file  Transportation Needs:   . Lack of Transportation (Medical): Not on file  . Lack of Transportation (Non-Medical): Not on file  Physical Activity:   . Days of Exercise per Week: Not on file  . Minutes of Exercise per Session: Not on file  Stress:   . Feeling of Stress : Not on file  Social Connections:   . Frequency of Communication with Friends and Family: Not on file  . Frequency of Social Gatherings with Friends and Family: Not on  file  . Attends Religious Services: Not on file  . Active Member of Clubs or Organizations: Not on file  . Attends Archivist Meetings: Not on file  . Marital Status: Not on file  Intimate Partner Violence:   . Fear of Current or Ex-Partner: Not on file  . Emotionally Abused: Not on file  . Physically Abused: Not on file  . Sexually Abused: Not on file    Medications:   Current Outpatient Medications on File Prior to Visit  Medication Sig Dispense Refill  . amLODipine (NORVASC) 5 MG tablet Take 5 mg by mouth 2 (two) times daily.    Marland Kitchen aspirin EC 81 MG tablet Take 81 mg by mouth daily.    Marland Kitchen escitalopram (LEXAPRO) 20 MG tablet     . glipiZIDE (GLUCOTROL) 5 MG tablet Take 5 mg by mouth daily.    Marland Kitchen MEMANTINE HCL PO Use as directed 10 mg in the mouth or throat 2 (two) times daily.    . metFORMIN (GLUCOPHAGE) 1000 MG tablet Take 1,000 mg by mouth 2 (two) times daily.     Marland Kitchen atorvastatin (LIPITOR) 20 MG tablet Take 3 tablets (60 mg total) by mouth daily for 30 days. 90 tablet 1   No current facility-administered medications on file prior to visit.    Allergies:  No Known Allergies  Physical Exam  Today's Vitals   04/10/19 0903 04/10/19 0938  BP: (!) 168/70 138/68  Pulse: (!) 53   Temp: 97.7 F (36.5 C)   Weight: 200 lb 3.2 oz (90.8 kg)   Height: 6' (1.829 m)    Body mass index is 27.15 kg/m.   General: well developed, well nourished very pleasant elderly Caucasian male, seated, in no evident distress Head: head normocephalic and atraumatic.   Neck: supple with no carotid or supraclavicular bruits Ears: Cerumen impacted bilaterally Cardiovascular: regular rate and rhythm, no murmurs Musculoskeletal: no deformity Skin:  no rash/petichiae Vascular:  Normal pulses all extremities  Neurologic Exam Mental Status: Awake and fully alert.  Normal speech and language.  Recent and remote memory diminished. Attention span, concentration and fund  of knowledge poor. Mood and  affect appropriate.  MMSE - Mini Mental State Exam 04/10/2019 11/05/2018 05/09/2018  Orientation to time 1 4 3   Orientation to Place 2 2 2   Registration 3 3 3   Attention/ Calculation 0 1 1  Recall 0 0 0  Language- name 2 objects 2 2 2   Language- repeat 1 1 1   Language- follow 3 step command 3 3 3   Language- read & follow direction 1 1 1   Write a sentence 0 0 0  Copy design 0 0 1  Total score 13 17 17    Cranial Nerves: Pupils equal, briskly reactive to light. Extraocular movements full without nystagmus. Visual fields full to confrontation. Hearing intact. Facial sensation intact. Face, tongue, palate moves normally and symmetrically.  Motor: Normal bulk and tone. Normal strength in all tested extremity muscles. Sensory.: intact to touch , pinprick , position and vibratory sensation.  Coordination: Rapid alternating movements normal in all extremities. Finger-to-nose and heel-to-shin performed accurately bilaterally. Gait and Station: Arises from chair without difficulty. Stance is normal. Gait demonstrates normal stride length and balance without use of assistive device Reflexes: 1+ and symmetric. Toes downgoing.       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  Remote history of embolic left temporal infarct in May 2018 of cryptogenic etiology with loop recorder placement which has not shown atrial fibrillation thus far.  Vascular risk factors of diabetes, hypertension and prior stroke.  MMSE today 13/30 (prior 17/30)    PLAN: -Recommend trialing restart Aricept 5 mg daily (take in the morning) and continuation of Namenda 10 mg twice daily -discussion regarding possibly trialing Exelon patch due to family reported side effects of Aricept at higher dose but daughter does not believe he will be compliant with use of the patch.  Patient and daughter are in agreement to retrial lower dose  of Aricept -Continuation of aspirin 81 mg daily and atorvastatin for secondary stroke prevention -Continue to follow with PCP for HTN, HLD and DM management.  Also advised to follow-up with PCP in regards to impacted cerumen which may be responsible for decreased hearing -Continue to monitor loop recorder for possible atrial fibrillation -maintain strict control of hypertension with blood pressure goal below 130/90, lipids with LDL cholesterol goal below 70 mg percent and diabetes with hemoglobin A1c goal below 6.5%.    Follow-up in 6 months or call earlier if needed  Greater than 50% time during this 30-minute visit was spent on counseling and coordination of care about his moderate cognitive impairment likely vascular with future treatment options with prior history of stroke, performing and reviewing MMSE, discussion regarding importance of managing secondary stroke risk factors including HTN, HLD and DM, discussion regarding ongoing monitoring of loop recorder and answered all questions to patient and daughter satisfaction    Frann Rider, AGNP-BC  [facility] [address] [place], [place] [address]  Phone [phone] Fax [phone] Note: This document was prepared with digital dictation and possible smart phrase technology. Any transcriptional errors that result from this process are unintentional.

## 2019-04-14 ENCOUNTER — Ambulatory Visit (INDEPENDENT_AMBULATORY_CARE_PROVIDER_SITE_OTHER): Payer: Medicare Other | Admitting: *Deleted

## 2019-04-14 DIAGNOSIS — I639 Cerebral infarction, unspecified: Secondary | ICD-10-CM | POA: Diagnosis not present

## 2019-04-14 LAB — CUP PACEART REMOTE DEVICE CHECK
Date Time Interrogation Session: 20210214232555
Implantable Pulse Generator Implant Date: 20190530

## 2019-04-15 NOTE — Progress Notes (Signed)
ILR Remote 

## 2019-05-12 ENCOUNTER — Ambulatory Visit (INDEPENDENT_AMBULATORY_CARE_PROVIDER_SITE_OTHER): Payer: Medicare Other | Admitting: *Deleted

## 2019-05-12 DIAGNOSIS — I639 Cerebral infarction, unspecified: Secondary | ICD-10-CM

## 2019-05-15 LAB — CUP PACEART REMOTE DEVICE CHECK
Date Time Interrogation Session: 20210318012235
Implantable Pulse Generator Implant Date: 20190530

## 2019-05-15 NOTE — Progress Notes (Signed)
ILR Remote 

## 2019-06-12 ENCOUNTER — Ambulatory Visit (INDEPENDENT_AMBULATORY_CARE_PROVIDER_SITE_OTHER): Payer: Medicare Other | Admitting: *Deleted

## 2019-06-12 DIAGNOSIS — I639 Cerebral infarction, unspecified: Secondary | ICD-10-CM

## 2019-06-16 LAB — CUP PACEART REMOTE DEVICE CHECK
Date Time Interrogation Session: 20210418013203
Implantable Pulse Generator Implant Date: 20190530

## 2019-06-16 NOTE — Progress Notes (Signed)
ILR Remote 

## 2019-07-14 ENCOUNTER — Ambulatory Visit (INDEPENDENT_AMBULATORY_CARE_PROVIDER_SITE_OTHER): Payer: Medicare Other | Admitting: *Deleted

## 2019-07-14 DIAGNOSIS — I639 Cerebral infarction, unspecified: Secondary | ICD-10-CM | POA: Diagnosis not present

## 2019-07-16 LAB — CUP PACEART REMOTE DEVICE CHECK
Date Time Interrogation Session: 20210519013403
Implantable Pulse Generator Implant Date: 20190530

## 2019-07-16 NOTE — Progress Notes (Signed)
Carelink Summary Report / Loop Recorder 

## 2019-07-21 ENCOUNTER — Telehealth: Payer: Self-pay

## 2019-07-21 NOTE — Telephone Encounter (Signed)
Spoke with patient's daughter [name] to inform of disconnected monitor. Monitor is at her house b/c pt can't transmit from his house. Gave future transmission dates and asked to transmit on those dates.

## 2019-08-18 ENCOUNTER — Ambulatory Visit (INDEPENDENT_AMBULATORY_CARE_PROVIDER_SITE_OTHER): Payer: Medicare Other | Admitting: *Deleted

## 2019-08-18 DIAGNOSIS — I639 Cerebral infarction, unspecified: Secondary | ICD-10-CM

## 2019-08-18 LAB — CUP PACEART REMOTE DEVICE CHECK
Date Time Interrogation Session: 20210621031153
Implantable Pulse Generator Implant Date: 20190530

## 2019-08-19 NOTE — Progress Notes (Signed)
Carelink Summary Report / Loop Recorder 

## 2019-09-22 ENCOUNTER — Ambulatory Visit (INDEPENDENT_AMBULATORY_CARE_PROVIDER_SITE_OTHER): Payer: Medicare Other | Admitting: *Deleted

## 2019-09-22 DIAGNOSIS — I639 Cerebral infarction, unspecified: Secondary | ICD-10-CM

## 2019-09-23 LAB — CUP PACEART REMOTE DEVICE CHECK
Date Time Interrogation Session: 20210725232930
Implantable Pulse Generator Implant Date: 20190530

## 2019-09-24 NOTE — Progress Notes (Signed)
Carelink Summary Report / Loop Recorder 

## 2019-10-16 ENCOUNTER — Ambulatory Visit (INDEPENDENT_AMBULATORY_CARE_PROVIDER_SITE_OTHER): Payer: Medicare Other | Admitting: Adult Health

## 2019-10-16 ENCOUNTER — Encounter: Payer: Self-pay | Admitting: Adult Health

## 2019-10-16 VITALS — BP 156/78 | HR 55 | Ht 72.0 in | Wt 205.6 lb

## 2019-10-16 DIAGNOSIS — F0150 Vascular dementia without behavioral disturbance: Secondary | ICD-10-CM | POA: Diagnosis not present

## 2019-10-16 DIAGNOSIS — Z8673 Personal history of transient ischemic attack (TIA), and cerebral infarction without residual deficits: Secondary | ICD-10-CM

## 2019-10-16 MED ORDER — MEMANTINE HCL 10 MG PO TABS: 10.0000 mg | ORAL_TABLET | Freq: Two times a day (BID) | ORAL | 3 refills | Status: DC

## 2019-10-16 NOTE — Patient Instructions (Addendum)
Your Plan:  Continue Namenda 10mg  twice daily for memory loss  Continue follow up with PCP for aggressive stroke risk factor management  Continue to stay active, maintain a healthy diet, adequate sleep and exercise   Follow up in 6 months or call earlier if needed     Thank you for coming to see [place] at [facility]. I hope we have been able to provide you high quality care today.  You may receive a patient satisfaction survey over the next few weeks. We would appreciate your feedback and comments so that we may continue to improve ourselves and the health of our patients.

## 2019-10-16 NOTE — Progress Notes (Signed)
[facility] [address] [place]. [place] [address] [phone]       OFFICE FOLLOW UP VISIT NOTE  [name] Date of Birth:  1940/06/12 Medical Record Number:  [id number]   Referring MD: Kathleen Lime, PA-c [facility] provider: Dr. Leonie Man PCP: Lavella Lemons, PA   Reason for Referral: Memory loss and dementia  Chief Complaint  Patient presents with  . Follow-up    6 month f/u. Had a fall about 2 months ago. Unable to take aricept due to mood swings.   . room 5    with daughter       HPI:  Today, 10/16/2019, Patrick Phillips returns for follow-up regarding history of stroke and vascular dementia accompanied by his daughter  MMSE today 12/30 (prior 13/30) Maintains ADLs independently and lives on own; daughter and SO help with medications and bill paying or any other needed assistance Remains on Namenda 10 mg twice daily tolerating without side effects Intolerant to Aricept due to mood changes  Denies hallucinations, delusions, agitation or any other behaviors  Continues to follow closely with PCP for HTN, HLD and DM management Remains on aspirin 81 mg daily and atorvastatin for secondary stroke prevention without side effects Loop recorder has not shown atrial fibrillation thus far  Does complain of lower extremity pain which is chronic. Did have recent fall but denies hitting head.  Daughter believes history of DM, vascular disease, prior injuries and arthritis likely contributing.  She plans on discussing further with PCP  No further concerns at this time       History provided for reference purposes only Update 04/10/2019: Patrick Phillips is a 79 year old male who is being seen today for cognitive impairment follow-up.  He was previously seen in on 11/05/2018 with Dr. Leonie Man.  Residual deficits include mild cognitive impairment which has been slightly worsening since prior visit but daughter discontinued use of Aricept due to concerns of increased irritation with 10 mg  dose.  MMSE today 13/30 with prior 17/30.  Denies any related behaviors such as agitation, paranoia or hallucinations.  She is questioning restarting Aricept at 5 mg dosage as he was doing very well on that dose in addition to Namenda 10 mg twice daily.  Daughter also notes decreased hearing, low education level and does not routinely pay attention to correct dates which she believes impacts MMSE.  He does endorse continuing to be active working on cars and doing things around his home and has not had any difficulty.  Continues on aspirin 81 mg daily and atorvastatin 20 mg daily for secondary stroke prevention without side effects.  Blood pressure today initially elevated but on recheck 138/68.  Loop recorder has not shown atrial fibrillation thus far.  No further concerns at this time.  Initial visit 05/09/2018 PS : Patrick Phillips is a pleasant 79 year old Caucasian male seen today for office consultation visit for memory loss.  He is accompanied by his daughter.  History is obtained from them and review of electronic medical records.  I have reviewed available imaging films in PACS.  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  The patient daughter feels that after his stroke this seems to have progressed further.  He has some good days and bad days.  Is also been having some mood swings and saw his primary care physician recently started  Lexapro 10 mg daily.  There was some improvement in his mood and behavior but cognitive is with difficulties persist.  The dose of Lexapro was increased to 20 mg last week.  The patient has trouble remembering recent information and figuring things out himself.  He is still lives at home but needs increasing help with on finances and remembering appointments or information.  He does have strong family history of  Alzheimer's dementia and 2 brothers and 2 sisters and that is why the daughter is worried about it.  Patient had a previous history of embolic left MCA branch infarct in May 2018 at that time he had extensive evaluation including TEE, loop recorder and so for paroxysmal A. fib has not been found.  At the my last visit I had found that he had mild cognitive impairment versus early dementia and is suggested a trial of Aricept but this was not done.  Today on the Mini-Mental status exam scored 17/30 and on the depression scale he was not found to be significantly depressed.  His clock drawing as well as animal naming were also impaired.  He is not had any recent lab work for reversible causes of memory loss, EEG or brain imaging done.  He has not had any further recurrent stroke or TIA symptoms and remains on aspirin which is tolerating well.  Is unclear as to when his last lipid profile was checked.  His blood pressure is elevated today at 177/70 with family feels it is better controlled at home.  He does not have any agitation, violent behavior, delusions, hallucinations and unsafe behavior. Virtual video visit 07/29/2018 PS: Patrick Phillips is seen for follow-up after his last visit with me on 05/09/2018.  He states he is doing well and has not noticed any new changes.  He has been able to tolerate Aricept and is presently taking 10 mg daily without any GI or CNS side effects.  The daughter feels he may get intermittently agitated on the 10 mg dose and was not doing so on the 5 mg dose earlier.  They did notice some cognitive improvement initially but that seems to have leveled off.  He does get intermittently agitated but can be redirected.  He has not had any violent behavior, delusions or hallucinations.  Is mostly independent in most activities of daily living.  He can feed himself dress himself.  He is still driving.  He has not had any recurrent stroke or TIA symptoms.  He had MRI scan of the brain done on 06/04/2018  which I personally reviewed shows changes of small vessel disease and mild generalized atrophy and no acute abnormality.  Lab work done on 05/09/2018 showed elevated LDL cholesterol of 96 mg percent and hemoglobin A1c was 7.0.  Patient's Lipitor dose was increased from 40 to 60 mg following these lab results and is tolerating it well without muscle aches or pains.  An EEG was ordered but has not been done due to constraints from the [place] pandemic.  He has no new complaints today. He has a loop recorder and so for paroxysmal A. fib has not yet been found.  He states his blood pressure and diabetes have been under good control. Update 11/05/2018 PS ; he returns for f/u after last virtual video visit 07/29/18.  Patient is accompanied by his daughter.  The patient is tolerating Aricept well but his caregiver friend who lives with him feels that he gets upset at night and this may be related to  the medication.  He had an EEG done following last visit on 08/26/2018 which was normal.  He continues to have mild cognitive difficulties but these are unchanged.  Is mostly independent and does a lot of work in his house and outdoors.  He has not had any recurrent stroke or TIA symptoms.  Loop recorder assessment from last week was negative for A. fib.  Is tolerating Lipitor well without muscle aches and pains.  Blood pressure is adequately controlled.  No new complaints.  No delusions, hallucinations or unsafe behavior     ROS:   14 system review of systems is positive for hearing loss, memory loss, confusion and all other systems negative  PMH:  Past Medical History:  Diagnosis Date  . Cancer ([place])    skin cancer  . Diabetes mellitus without complication ([place])   . Hypertension   . Memory deficit     Social History:  Social History   Socioeconomic History  . Marital status: Married    Spouse name: Not on file  . Number of children: Not on file  . Years of education: Not on file  . Highest education  level: Not on file  Occupational History  . Not on file  Tobacco Use  . Smoking status: Never Smoker  . Smokeless tobacco: Never Used  Vaping Use  . Vaping Use: Never used  Substance and Sexual Activity  . Alcohol use: Yes    Comment: occ  . Drug use: No  . Sexual activity: Not on file  Other Topics Concern  . Not on file  Social History Narrative  . Not on file   Social Determinants of Health   Financial Resource Strain:   . Difficulty of Paying Living Expenses: Not on file  Food Insecurity:   . Worried About Charity fundraiser in the Last Year: Not on file  . Ran Out of Food in the Last Year: Not on file  Transportation Needs:   . Lack of Transportation (Medical): Not on file  . Lack of Transportation (Non-Medical): Not on file  Physical Activity:   . Days of Exercise per Week: Not on file  . Minutes of Exercise per Session: Not on file  Stress:   . Feeling of Stress : Not on file  Social Connections:   . Frequency of Communication with Friends and Family: Not on file  . Frequency of Social Gatherings with Friends and Family: Not on file  . Attends Religious Services: Not on file  . Active Member of Clubs or Organizations: Not on file  . Attends Archivist Meetings: Not on file  . Marital Status: Not on file  Intimate Partner Violence:   . Fear of Current or Ex-Partner: Not on file  . Emotionally Abused: Not on file  . Physically Abused: Not on file  . Sexually Abused: Not on file    Medications:   Current Outpatient Medications on File Prior to Visit  Medication Sig Dispense Refill  . amLODipine (NORVASC) 5 MG tablet Take 5 mg by mouth 2 (two) times daily.    Marland Kitchen aspirin EC 81 MG tablet Take 81 mg by mouth daily.    Marland Kitchen escitalopram (LEXAPRO) 20 MG tablet     . glipiZIDE (GLUCOTROL) 5 MG tablet Take 5 mg by mouth daily.    . metFORMIN (GLUCOPHAGE) 1000 MG tablet Take 1,000 mg by mouth 2 (two) times daily.     Marland Kitchen atorvastatin (LIPITOR) 20 MG tablet Take  3 tablets (  60 mg total) by mouth daily for 30 days. 90 tablet 1   No current facility-administered medications on file prior to visit.    Allergies:   Allergies  Allergen Reactions  . Aricept [Donepezil]     Per daughter, increased mood swings and angry    Physical Exam  Today's Vitals   10/16/19 1026  BP: (!) 156/78  Pulse: (!) 55  Weight: 205 lb 9.6 oz (93.3 kg)  Height: 6' (1.829 m)   Body mass index is 27.88 kg/m.   General: well developed, well nourished very pleasant elderly Caucasian male, seated, in no evident distress Neck: supple with no carotid or supraclavicular bruits Ears: Cerumen impacted bilaterally Cardiovascular: regular rate and rhythm, no murmurs Vascular:  Normal pulses all extremities  Neurologic Exam Mental Status: Awake and fully alert.  Normal speech and language.  Recent memory diminished and remote memory intact. Attention span, concentration and fund of knowledge poor. Mood and affect appropriate.  MMSE - Mini Mental State Exam 10/16/2019 04/10/2019 11/05/2018  Orientation to time 0 1 4  Orientation to Place 2 2 2   Registration 3 3 3   Attention/ Calculation 0 0 1  Recall 0 0 0  Language- name 2 objects 2 2 2   Language- repeat 1 1 1   Language- follow 3 step command 3 3 3   Language- read & follow direction 1 1 1   Write a sentence 0 0 0  Copy design 0 0 0  Total score 12 13 17    Cranial Nerves: Pupils equal, briskly reactive to light. Extraocular movements full without nystagmus. Visual fields full to confrontation. Hearing intact. Facial sensation intact. Face, tongue, palate moves normally and symmetrically.  Motor: Normal bulk and tone. Normal strength in all tested extremity muscles. Sensory.: intact to touch , pinprick , position and vibratory sensation.  Coordination: Rapid alternating movements normal in all extremities. Finger-to-nose and heel-to-shin performed accurately bilaterally. Gait and Station: Arises from chair without  difficulty. Stance is normal. Gait demonstrates normal stride length and balance without use of assistive device Reflexes: 1+ and symmetric. Toes downgoing.       ASSESSMENT/PLAN: 79 year old Caucasian male with progressive memory loss and cognitive worsening likely due to vascular dementia post stroke.  Remote history of embolic left temporal infarct in May 2018 of cryptogenic etiology with loop recorder placement which has not shown atrial fibrillation thus far.  Vascular risk factors of diabetes, hypertension and prior stroke.     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  Continues to be highly functioning and lives independently maintaining ADLs independently with some assistance needed regarding IADLs -Continue Namenda 10 mg twice daily -refill provided -Unable to tolerate Aricept -Discussed importance of managing stroke risk factors such as HTN, HLD and DM as well as routine exercise, healthy diet and adequate sleep   History of stroke -Continuation of aspirin 81 mg daily and atorvastatin for secondary stroke prevention -Continue to follow with PCP for HTN, HLD and DM management -Loop recorder has not shown atrial fibrillation thus far. Monthly reports by cardiology -maintain strict control of hypertension with blood pressure goal below 130/90, lipids with LDL cholesterol goal below 70 mg percent and diabetes with hemoglobin A1c goal below 6.5%.     Follow-up in 6 months or call earlier if needed   I spent 30 minutes of face-to-face and non-face-to-face time with patient and daughter.  This included previsit chart review, lab review,  study review, order entry, electronic health record documentation, patient education regarding vascular dementia and ongoing use of Namenda, importance of managing stroke risk factors with prior stroke history, completion and review of MMSE  and answered all questions to patient and daughter satisfaction   Frann Rider, AGNP-BC  [facility] [address] [place], [place] [address]  Phone [phone] Fax [phone] Note: This document was prepared with digital dictation and possible smart phrase technology. Any transcriptional errors that result from this process are unintentional.

## 2019-10-17 NOTE — Progress Notes (Signed)
I agree with the above plan 

## 2019-12-01 ENCOUNTER — Ambulatory Visit (INDEPENDENT_AMBULATORY_CARE_PROVIDER_SITE_OTHER): Payer: Medicare Other

## 2019-12-01 DIAGNOSIS — I639 Cerebral infarction, unspecified: Secondary | ICD-10-CM

## 2019-12-01 LAB — CUP PACEART REMOTE DEVICE CHECK
Date Time Interrogation Session: 20210929233643
Implantable Pulse Generator Implant Date: 20190530

## 2019-12-02 NOTE — Progress Notes (Signed)
Carelink Summary Report / Loop Recorder 

## 2020-01-01 LAB — CUP PACEART REMOTE DEVICE CHECK
Date Time Interrogation Session: 20211101233721
Implantable Pulse Generator Implant Date: 20190530

## 2020-01-05 ENCOUNTER — Ambulatory Visit (INDEPENDENT_AMBULATORY_CARE_PROVIDER_SITE_OTHER): Payer: Medicare Other

## 2020-01-05 DIAGNOSIS — I639 Cerebral infarction, unspecified: Secondary | ICD-10-CM | POA: Diagnosis not present

## 2020-01-06 NOTE — Progress Notes (Signed)
Carelink Summary Report / Loop Recorder 

## 2020-01-13 ENCOUNTER — Emergency Department (HOSPITAL_COMMUNITY)
Admission: EM | Admit: 2020-01-13 | Discharge: 2020-01-13 | Disposition: A | Discharge status: Home / Self Care | Payer: Medicare Other | Attending: Emergency Medicine | Admitting: Emergency Medicine

## 2020-01-13 ENCOUNTER — Emergency Department (HOSPITAL_COMMUNITY): Payer: Medicare Other

## 2020-01-13 ENCOUNTER — Other Ambulatory Visit: Payer: Self-pay

## 2020-01-13 ENCOUNTER — Encounter (HOSPITAL_COMMUNITY): Payer: Self-pay | Admitting: Emergency Medicine

## 2020-01-13 DIAGNOSIS — I10 Essential (primary) hypertension: Secondary | ICD-10-CM | POA: Insufficient documentation

## 2020-01-13 DIAGNOSIS — E119 Type 2 diabetes mellitus without complications: Secondary | ICD-10-CM | POA: Insufficient documentation

## 2020-01-13 DIAGNOSIS — M79605 Pain in left leg: Secondary | ICD-10-CM | POA: Diagnosis not present

## 2020-01-13 DIAGNOSIS — N179 Acute kidney failure, unspecified: Secondary | ICD-10-CM | POA: Insufficient documentation

## 2020-01-13 DIAGNOSIS — Z20822 Contact with and (suspected) exposure to covid-19: Secondary | ICD-10-CM | POA: Diagnosis not present

## 2020-01-13 DIAGNOSIS — F0150 Vascular dementia without behavioral disturbance: Secondary | ICD-10-CM | POA: Insufficient documentation

## 2020-01-13 DIAGNOSIS — Z79899 Other long term (current) drug therapy: Secondary | ICD-10-CM | POA: Diagnosis not present

## 2020-01-13 DIAGNOSIS — R202 Paresthesia of skin: Secondary | ICD-10-CM | POA: Diagnosis present

## 2020-01-13 DIAGNOSIS — Z7984 Long term (current) use of oral hypoglycemic drugs: Secondary | ICD-10-CM | POA: Diagnosis not present

## 2020-01-13 DIAGNOSIS — Z85828 Personal history of other malignant neoplasm of skin: Secondary | ICD-10-CM | POA: Diagnosis not present

## 2020-01-13 DIAGNOSIS — Z7982 Long term (current) use of aspirin: Secondary | ICD-10-CM | POA: Insufficient documentation

## 2020-01-13 DIAGNOSIS — M79604 Pain in right leg: Secondary | ICD-10-CM | POA: Insufficient documentation

## 2020-01-13 DIAGNOSIS — R0602 Shortness of breath: Secondary | ICD-10-CM

## 2020-01-13 HISTORY — DX: Unspecified dementia, unspecified severity, without behavioral disturbance, psychotic disturbance, mood disturbance, and anxiety: F03.90

## 2020-01-13 LAB — CBC
HCT: 40.5 % (ref 39.0–52.0)
Hemoglobin: 13.1 g/dL (ref 13.0–17.0)
MCH: 29.1 pg (ref 26.0–34.0)
MCHC: 32.3 g/dL (ref 30.0–36.0)
MCV: 90 fL (ref 80.0–100.0)
Platelets: 232 10*3/uL (ref 150–400)
RBC: 4.5 MIL/uL (ref 4.22–5.81)
RDW: 12.7 % (ref 11.5–15.5)
WBC: 9.5 10*3/uL (ref 4.0–10.5)
nRBC: 0 % (ref 0.0–0.2)

## 2020-01-13 LAB — BASIC METABOLIC PANEL
Anion gap: 9 (ref 5–15)
BUN: 31 mg/dL — ABNORMAL HIGH (ref 8–23)
CO2: 22 mmol/L (ref 22–32)
Calcium: 9.4 mg/dL (ref 8.9–10.3)
Chloride: 106 mmol/L (ref 98–111)
Creatinine, Ser: 1.61 mg/dL — ABNORMAL HIGH (ref 0.61–1.24)
GFR, Estimated: 44 mL/min — ABNORMAL LOW (ref >60)
Glucose, Bld: 131 mg/dL — ABNORMAL HIGH (ref 70–99)
Potassium: 4.2 mmol/L (ref 3.5–5.1)
Sodium: 137 mmol/L (ref 135–145)

## 2020-01-13 LAB — RESPIRATORY PANEL BY RT PCR (FLU A&B, COVID)
Influenza A by PCR: NEGATIVE
Influenza B by PCR: NEGATIVE
SARS Coronavirus 2 by RT PCR: NEGATIVE

## 2020-01-13 MED ORDER — ACETAMINOPHEN 500 MG PO TABS
1000.0000 mg | ORAL_TABLET | Freq: Once | ORAL | Status: AC
  Administered 2020-01-13: 1000 mg via ORAL
  Filled 2020-01-13: qty 2

## 2020-01-13 NOTE — ED Notes (Signed)
Pt was called to reassess vitals. Patient went outside.

## 2020-01-13 NOTE — ED Triage Notes (Signed)
Pt is complaining of bilateral leg pain with numbness. Pt also states he is short of breath. NADN

## 2020-01-13 NOTE — ED Provider Notes (Addendum)
[facility] EMERGENCY DEPARTMENT Provider Note   CSN: [id number] Arrival date & time: 01/13/20  1422     History Chief Complaint  Patient presents with  . Leg Pain    Patrick Phillips is a 79 y.o. male.  Patient with history of diabetes on Metformin, vascular dementia, acute stroke presents with bilateral leg pain and paresthesias for the past few days.  No leg weakness.  No injuries.  Patient also had mild shortness of breath start this afternoon with minimal cough, no fevers or chills.  Patient has history of high blood pressure and is on Norvasc for this.        Past Medical History:  Diagnosis Date  . Cancer ([place])    skin cancer  . Dementia ([place])   . Diabetes mellitus without complication ([place])   . Hypertension   . Memory deficit     Patient Active Problem List   Diagnosis Date Noted  . Acute ischemic stroke ([place]) 07/24/2017  . Chest pain 12/22/2015  . Chronic back pain 12/22/2015  . Syncope 10/17/2013  . Sinus bradycardia 10/17/2013  . SOB (shortness of breath) 10/17/2013  . Hypertension   . Diabetes mellitus without complication [facility])     Past Surgical History:  Procedure Laterality Date  . BACK SURGERY    . HERNIA REPAIR    . LOOP RECORDER INSERTION N/A 07/26/2017   Procedure: LOOP RECORDER INSERTION;  Surgeon: Evans Lance, MD;  Location: [facility] CV LAB;  Service: Cardiovascular;  Laterality: N/A;  . TEE WITHOUT CARDIOVERSION N/A 07/26/2017   Procedure: TRANSESOPHAGEAL ECHOCARDIOGRAM (TEE);  Surgeon: Fay Records, MD;  Location: [facility] ENDOSCOPY;  Service: Cardiovascular;  Laterality: N/A;  . TRIGGER FINGER RELEASE Right 12/02/2015   Procedure: RIGHT THUMB RELEASE TRIGGER FINGER/A-1 PULLEY;  Surgeon: Leanora Cover, MD;  Location: [facility];  Service: Orthopedics;  Laterality: Right;       Family History  Problem Relation Age of Onset  . Cancer Mother   . Cancer Father   . Cancer Brother   . Cancer Other     Social History    Tobacco Use  . Smoking status: Never Smoker  . Smokeless tobacco: Never Used  Vaping Use  . Vaping Use: Never used  Substance Use Topics  . Alcohol use: Yes    Comment: occ  . Drug use: No    Home Medications Prior to Admission medications   Medication Sig Start Date End Date Taking? Authorizing Provider  amLODipine (NORVASC) 5 MG tablet Take 5 mg by mouth 2 (two) times daily. 05/30/17   [provider]  aspirin EC 81 MG tablet Take 81 mg by mouth daily.    [provider]  atorvastatin (LIPITOR) 20 MG tablet Take 3 tablets (60 mg total) by mouth daily for 30 days. 07/29/18 08/28/18  Garvin Fila, MD  escitalopram (LEXAPRO) 20 MG tablet  04/30/18   [provider]  glipiZIDE (GLUCOTROL) 5 MG tablet Take 5 mg by mouth daily. 07/11/17   [provider]  memantine (NAMENDA) 10 MG tablet Take 1 tablet (10 mg total) by mouth 2 (two) times daily. 10/16/19   Frann Rider, NP  metFORMIN (GLUCOPHAGE) 1000 MG tablet Take 1,000 mg by mouth 2 (two) times daily.  07/31/13   [provider]    Allergies    Aricept [donepezil]  Review of Systems   Review of Systems  Constitutional: Negative for chills and fever.  HENT: Negative for congestion.   Eyes:  Negative for visual disturbance.  Respiratory: Positive for cough and shortness of breath.   Cardiovascular: Negative for chest pain.  Gastrointestinal: Negative for abdominal pain and vomiting.  Genitourinary: Negative for dysuria and flank pain.  Musculoskeletal: Negative for back pain, neck pain and neck stiffness.  Skin: Negative for rash.  Neurological: Negative for light-headedness and headaches.    Physical Exam Updated Vital Signs BP (!) 171/102   Pulse (!) 51   Temp 97.6 F (36.4 C)   Resp (!) 23   Ht 6' (1.829 m)   Wt 94.8 kg   SpO2 98%   BMI 28.35 kg/m   Physical Exam Vitals and nursing note reviewed.  Constitutional:      Appearance: He is well-developed.  HENT:     Head:  Normocephalic and atraumatic.     Mouth/Throat:     Mouth: Mucous membranes are dry.  Eyes:     General:        Right eye: No discharge.        Left eye: No discharge.     Conjunctiva/sclera: Conjunctivae normal.  Neck:     Trachea: No tracheal deviation.  Cardiovascular:     Rate and Rhythm: Normal rate and regular rhythm.  Pulmonary:     Effort: Pulmonary effort is normal.     Breath sounds: Normal breath sounds.  Abdominal:     General: There is no distension.     Palpations: Abdomen is soft.     Tenderness: There is no abdominal tenderness. There is no guarding.  Musculoskeletal:     Cervical back: Normal range of motion and neck supple.  Skin:    General: Skin is warm.     Findings: No rash.  Neurological:     General: No focal deficit present.     Mental Status: He is alert.     Comments: Pleasant dementia, follows commands, answers most questions appropriately however forgets his age. Patient moves all extremities equal bilateral with normal strength, sensation grossly intact palpation bilateral.  Patient has 2+ PT and DP pulses bilateral, no leg edema.     ED Results / Procedures / Treatments   Labs (all labs ordered are listed, but only abnormal results are displayed) Labs Reviewed  BASIC METABOLIC PANEL - Abnormal; Notable for the following components:      Result Value   Glucose, Bld 131 (*)    BUN 31 (*)    Creatinine, Ser 1.61 (*)    GFR, Estimated 44 (*)    All other components within normal limits  RESPIRATORY PANEL BY RT PCR (FLU A&B, COVID)  CBC    EKG EKG Interpretation  Date/Time:  Tuesday January 13 2020 14:34:00 EST Ventricular Rate:  61 PR Interval:    QRS Duration: 96 QT Interval:  396 QTC Calculation: 398 R Axis:   -37 Text Interpretation: Normal sinus rhythm Left axis deviation Septal infarct , age undetermined Abnormal ECG Confirmed by Elnora Morrison [id number]) on 01/13/2020 9:33:35 PM   Radiology DG Chest Portable 1 View  Result  Date: 01/13/2020 CLINICAL DATA:  Shortness of breath, bilateral leg pain, numbness EXAM: PORTABLE CHEST 1 VIEW COMPARISON:  Radiograph 05/20/2018 FINDINGS: Coarsened interstitial features may be accentuated by low lung volumes given and overall similar to comparison imaging. No new consolidative opacity. No pneumothorax or visible effusion. The aorta is calcified. The remaining cardiomediastinal contours are unremarkable. Implantable loop recorder projects over the cardiac apex. No acute osseous or soft tissue abnormality. Degenerative changes are present  in the imaged spine and shoulders. Telemetry leads overlie the chest. IMPRESSION: Chronic coarsened interstitial features accentuated by low volumes and atelectasis. No acute cardiopulmonary process. Aortic Atherosclerosis (ICD10-I70.0). Electronically Signed   By: Lovena Le M.D.   On: 01/13/2020 22:08    Procedures Procedures (including critical care time)  Medications Ordered in ED Medications  acetaminophen (TYLENOL) tablet 1,000 mg (1,000 mg Oral Given 01/13/20 2228)    ED Course  I have reviewed the triage vital signs and the nursing notes.  Pertinent labs & imaging results that were available during my care of the patient were reviewed by me and considered in my medical decision making (see chart for details).    MDM Rules/Calculators/A&P                          Patient presents with mild leg symptoms discussed differential diagnosis with family including diabetic neuropathy, musculoskeletal or other nerve related, cramping from dehydration, other.  Normal pulses distally, normal strength and sensation.  No focal back pain.  I do not feel patient needs emergent MRI or further imaging at this time.  Discussed follow-up outpatient for this. Patient has mild shortness of breath not requiring oxygen, normal work of breathing, chest x-ray chronic findings no infiltrate reviewed.  Covid test ordered for outpatient follow-up. Blood work  reviewed showing acute renal failure creatinine 1.6.  Discussed importance of oral hydration and repeat blood work next week.    Patrick Phillips was evaluated in Emergency Department on 01/13/2020 for the symptoms described in the history of present illness. He was evaluated in the context of the global COVID-19 pandemic, which necessitated consideration that the patient might be at risk for infection with the SARS-CoV-2 virus that causes COVID-19. Institutional protocols and algorithms that pertain to the evaluation of patients at risk for COVID-19 are in a state of rapid change based on information released by regulatory bodies including the CDC and federal and state organizations. These policies and algorithms were followed during the patient's care in the ED.   Final Clinical Impression(s) / ED Diagnoses Final diagnoses:  Hypertension, unspecified type  Leg pain, bilateral  Shortness of breath  Acute renal failure, unspecified acute renal failure type [facility])    Rx / DC Orders ED Discharge Orders    None       Elnora Morrison, MD 01/13/20 2225    Elnora Morrison, MD 01/13/20 2314

## 2020-01-13 NOTE — Discharge Instructions (Addendum)
Follow-up closely with your physician, stay well-hydrated and have repeat kidney function blood work done next week. Your chest xray did not show any new concerns.

## 2020-02-07 LAB — CUP PACEART REMOTE DEVICE CHECK
Date Time Interrogation Session: 20211204224221
Implantable Pulse Generator Implant Date: 20190530

## 2020-02-09 ENCOUNTER — Ambulatory Visit (INDEPENDENT_AMBULATORY_CARE_PROVIDER_SITE_OTHER): Payer: Medicare Other

## 2020-02-09 DIAGNOSIS — I639 Cerebral infarction, unspecified: Secondary | ICD-10-CM

## 2020-02-24 NOTE — Progress Notes (Signed)
Carelink Summary Report / Loop Recorder 

## 2020-03-03 ENCOUNTER — Ambulatory Visit: Payer: Self-pay | Admitting: Adult Health

## 2020-03-15 ENCOUNTER — Ambulatory Visit (INDEPENDENT_AMBULATORY_CARE_PROVIDER_SITE_OTHER): Payer: Medicare Other

## 2020-03-15 DIAGNOSIS — I639 Cerebral infarction, unspecified: Secondary | ICD-10-CM

## 2020-03-29 NOTE — Progress Notes (Signed)
Carelink Summary Report / Loop Recorder 

## 2020-04-19 ENCOUNTER — Ambulatory Visit (INDEPENDENT_AMBULATORY_CARE_PROVIDER_SITE_OTHER): Payer: Medicare Other

## 2020-04-19 ENCOUNTER — Other Ambulatory Visit: Payer: Self-pay

## 2020-04-19 ENCOUNTER — Encounter: Payer: Self-pay | Admitting: Adult Health

## 2020-04-19 ENCOUNTER — Ambulatory Visit (INDEPENDENT_AMBULATORY_CARE_PROVIDER_SITE_OTHER): Payer: Medicare Other | Admitting: Adult Health

## 2020-04-19 VITALS — BP 142/62 | HR 59 | Ht 72.0 in | Wt 190.0 lb

## 2020-04-19 DIAGNOSIS — F0150 Vascular dementia without behavioral disturbance: Secondary | ICD-10-CM | POA: Diagnosis not present

## 2020-04-19 DIAGNOSIS — I639 Cerebral infarction, unspecified: Secondary | ICD-10-CM | POA: Diagnosis not present

## 2020-04-19 DIAGNOSIS — Z8673 Personal history of transient ischemic attack (TIA), and cerebral infarction without residual deficits: Secondary | ICD-10-CM | POA: Diagnosis not present

## 2020-04-19 LAB — CUP PACEART REMOTE DEVICE CHECK
Date Time Interrogation Session: 20220217235826
Implantable Pulse Generator Implant Date: 20190530

## 2020-04-19 NOTE — Patient Instructions (Addendum)
Your Plan:  No changes made today - continue current treatment plan  Continue to follow with PCP for aggressive stroke risk factor management including blood pressure and cholesterol  Loop recorder will continue to be monitored for possible atrial fibrillation    Follow up as needed    Thank you for coming to see [place] at [facility]. I hope we have been able to provide you high quality care today.  You may receive a patient satisfaction survey over the next few weeks. We would appreciate your feedback and comments so that we may continue to improve ourselves and the health of our patients.     Mild Neurocognitive Disorder Mild neurocognitive disorder, formerly known as mild cognitive impairment, is a disorder in which memory does not work as well as it should. This disorder may also cause problems with other mental functions, including thought, communication, behavior, and completion of tasks. These problems can be noticed and measured, but they usually do not interfere with daily activities or the ability to live independently. Mild neurocognitive disorder typically develops after 79 years of age, but it can also develop at younger ages. It is not as serious as major neurocognitive disorder, also known as dementia, but it may be the first sign of it. Generally, symptoms of this condition get worse over time. In rare cases, symptoms can get better. What are the causes? This condition may be caused by:  Brain disorders like Alzheimer's disease, Parkinson's disease, and other conditions that gradually damage nerve cells (neurodegenerative conditions).  Diseases that affect blood vessels in the brain and result in small strokes.  Certain infections, such as HIV.  Traumatic brain injury.  Other medical conditions, such as brain tumors, underactive thyroid (hypothyroidism), and vitamin B12 deficiency.  Use of certain drugs or prescription medicines. What increases  the risk? The following factors may make you more likely to develop this condition:  Being older than 65 years.  Being male.  Low education level.  Diabetes, high blood pressure, high cholesterol, and other conditions that increase the risk for blood vessel diseases.  Untreated or undertreated sleep apnea.  Having a certain type of gene that can be passed from parent to child (inherited).  Chronic health problems such as heart disease, lung disease, liver disease, kidney disease, or depression. What are the signs or symptoms? Symptoms of this condition include:  Difficulty remembering. You may: ? Forget names, phone numbers, or details of recent events. ? Forget social events and appointments. ? Repeatedly forget where you put your car keys or other items.  Difficulty thinking and solving problems. You may have trouble with complex tasks, such as: ? Paying bills. ? Driving in unfamiliar places.  Difficulty communicating. You may have trouble: ? Finding the right word or naming an object. ? Forming a sentence that makes sense, or understanding what you read or hear.  Changes in your behavior or personality. When this happens, you may: ? Lose interest in the things that you used to enjoy. ? Withdraw from social situations. ? Get angry more easily than usual. ? Act before thinking. How is this diagnosed? This condition is diagnosed based on:  Your symptoms. Your health care provider may ask you and the people you spend time with, such as family and friends, about your symptoms.  Evaluation of mental functions (neuropsychological testing). Your health care provider may refer you to a neurologist or mental health specialist to evaluate your mental functions in detail. To identify the cause of  your condition, your health care provider may:  Get a detailed medical history.  Ask about use of alcohol, drugs, and prescription medicines.  Do a physical exam.  Order blood tests  and brain imaging exams. How is this treated? Mild neurocognitive disorder that is caused by medicine use, drug use, infection, or another medical condition may improve when the cause is treated, or when medicines or drugs are stopped. If this disorder has another cause, it generally does not improve and may get worse. In these cases, the goal of treatment is to help you manage the loss of mental function. Treatments in these cases include:  Medicine. Medicine mainly helps memory and behavior symptoms.  Talk therapy. Talk therapy provides education, emotional support, memory aids, and other ways of making up for problems with mental function.  Lifestyle changes, including: ? Getting regular exercise. ? Eating a healthy diet that includes omega-3 fatty acids. ? Challenging your thinking and memory skills. ? Having more social interaction. Follow these instructions at home: Eating and drinking  Drink enough fluid to keep your urine pale yellow.  Eat a healthy diet that includes omega-3 fatty acids. These can be found in: ? Fish. ? Nuts. ? Leafy vegetables. ? Vegetable oils.  If you drink alcohol: ? Limit how much you use to:  0-1 drink a day for women.  0-2 drinks a day for men. ? Be aware of how much alcohol is in your drink. In the [place] one drink equals one 12 oz bottle of beer (355 mL), one 5 oz glass of wine (148 mL), or one 1 oz glass of hard liquor (44 mL).   Lifestyle  Get regular exercise as told by your health care provider.  Do not use any products that contain nicotine or tobacco, such as cigarettes, e-cigarettes, and chewing tobacco. If you need help quitting, ask your health care provider.  Practice ways to manage stress. If you need help managing stress, ask your health care provider.  Continue to have social interaction.  Keep your mind active with stimulating activities you enjoy, such as reading or playing games.  Make sure to get quality sleep. Follow  these tips: ? Avoid napping during the day. ? Keep your sleeping area dark and cool. ? Avoid exercising during the few hours before you go to bed. ? Avoid caffeine products in the evening.   General instructions  Take over-the-counter and prescription medicines only as told by your health care provider. Your health care provider may recommend that you avoid taking medicines that can affect thinking, such as pain medicines or sleep medicines.  Work with your health care provider to find out what you need help with and what your safety needs are.  Keep all follow-up visits. This is important. Where to find more information  [employer] on Aging: http://kim-miller.com/ Contact a health care provider if:  You have any new symptoms. Get help right away if:  You develop new confusion or your confusion gets worse.  You act in ways that place you or your family in danger. Summary  Mild neurocognitive disorder is a disorder in which memory does not work as well as it should.  Mild neurocognitive disorder can have many causes. It may be the first stage of dementia.  To manage your condition, get regular exercise, keep your mind active, get quality sleep, and eat a healthy diet. This information is not intended to replace advice given to you by your health care provider. Make sure you  discuss any questions you have with your health care provider. Document Revised: 06/30/2019 Document Reviewed: 06/30/2019 Elsevier Patient Education  [place].

## 2020-04-19 NOTE — Progress Notes (Signed)
[facility] [address] [name] [address] [phone]       OFFICE FOLLOW UP VISIT NOTE  [name] Date of Birth:  08-20-1940 Medical Record Number:  [id number]   Referring MD: Kathleen Lime, PA-c [facility] provider: Dr. Leonie Man PCP: [name], MD   Reason for visit: stroke and dementia f/u  Chief Complaint  Patient presents with  . Follow-up    Rm 50 with daughter (brenda) Pt is well, daughter states his short term memory has decreased      HPI:  Today, 04/19/2020, [name] returns for 5-month follow-up regarding dementia and prior stroke history accompanied by his daughter   Vascular dementia: Per daughter, he continues to struggle occasionally with short-term memory but overall stable.  He continues to maintain ADLs independently as well as driving short distances without any difficulty MMSE today 12/30 (prior 12/30) - per daughter, he does not typically keep track of dates as he is retired and would not be familiar with the season as he is a "simple country folk" and associates the word "season" with salt and pepper not Fall, Winter, etc.  Remains on Namenda -denies side effects Denies behavioral concerns - does report occasional insomnia   Hx of stroke: Stable without new or worsening stroke/TIA symptoms.  Reports compliance with aspirin 81 mg daily and atorvastatin - denies side effects.  Blood pressure today initially elevated and on recheck 142/62.  Loop recorder has not shown atrial fibrillation thus far.   Recent COVID 19 positive cleared off quarantine yesterday per heath department per daughter.  He remained asymptomatic throughout.     History provided for reference purposes only Update 10/16/2019 JM: Patrick Phillips returns for follow-up regarding history of stroke and vascular dementia accompanied by his daughter MMSE today 12/30 (prior 13/30) Maintains ADLs independently and lives on own; daughter and SO help with medications  and bill paying or any other needed assistance Remains on Namenda 10 mg twice daily tolerating without side effects Intolerant to Aricept due to mood changes  Denies hallucinations, delusions, agitation or any other behaviors Continues to follow closely with PCP for HTN, HLD and DM management Remains on aspirin 81 mg daily and atorvastatin for secondary stroke prevention without side effects Loop recorder has not shown atrial fibrillation thus far Does complain of lower extremity pain which is chronic. Did have recent fall but denies hitting head.  Daughter believes history of DM, vascular disease, prior injuries and arthritis likely contributing.  She plans on discussing further with PCP No further concerns at this time   Update 04/10/2019: PatrickPhillips is a 80 year old male who is being seen today for cognitive impairment follow-up.  He was previously seen in on 11/05/2018 with Dr. Leonie Man.  Residual deficits include mild cognitive impairment which has been slightly worsening since prior visit but daughter discontinued use of Aricept due to concerns of increased irritation with 10 mg dose.  MMSE today 13/30 with prior 17/30.  Denies any related behaviors such as agitation, paranoia or hallucinations.  She is questioning restarting Aricept at 5 mg dosage as he was doing very well on that dose in addition to Namenda 10 mg twice daily.  Daughter also notes decreased hearing, low education level and does not routinely pay attention to correct dates which she believes impacts MMSE.  He does endorse continuing to be active working on cars and doing things around his home and has not had any difficulty.  Continues on aspirin 81 mg daily and  atorvastatin 20 mg daily for secondary stroke prevention without side effects.  Blood pressure today initially elevated but on recheck 138/68.  Loop recorder has not shown atrial fibrillation thus far.  No further concerns at this time.  Initial visit 05/09/2018 PS : Patrick Phillips is a  pleasant 80 year old Caucasian male seen today for office consultation visit for memory loss.  He is accompanied by his daughter.  History is obtained from them and review of electronic medical records.  I have reviewed available imaging films in PACS.  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  The patient daughter feels that after his stroke this seems to have progressed further.  He has some good days and bad days.  Is also been having some mood swings and saw his primary care physician recently started Lexapro 10 mg daily.  There was some improvement in his mood and behavior but cognitive is with difficulties persist.  The dose of Lexapro was increased to 20 mg last week.  The patient has trouble remembering recent information and figuring things out himself.  He is still lives at home but needs increasing help with on finances and remembering appointments or information.  He does have strong family history of Alzheimer's dementia and 2 brothers and 2 sisters and that is why the daughter is worried about it.  Patient had a previous history of embolic left MCA branch infarct in May 2018 at that time he had extensive evaluation including TEE, loop recorder and so for paroxysmal A. fib has not been found.  At the my last visit I had found that he had mild cognitive impairment versus early dementia and is suggested a trial of Aricept but this was not done.  Today on the Mini-Mental status exam scored 17/30 and on the depression scale he was not found to be significantly depressed.  His clock drawing as well as animal naming were also impaired.  He is not had any recent lab work for reversible causes of memory loss, EEG or brain imaging done.  He has not had any further recurrent stroke or TIA symptoms and remains on aspirin which is tolerating well.   Is unclear as to when his last lipid profile was checked.  His blood pressure is elevated today at 177/70 with family feels it is better controlled at home.  He does not have any agitation, violent behavior, delusions, hallucinations and unsafe behavior. Virtual video visit 07/29/2018 PS: Mr. Adel is seen for follow-up after his last visit with me on 05/09/2018.  He states he is doing well and has not noticed any new changes.  He has been able to tolerate Aricept and is presently taking 10 mg daily without any GI or CNS side effects.  The daughter feels he may get intermittently agitated on the 10 mg dose and was not doing so on the 5 mg dose earlier.  They did notice some cognitive improvement initially but that seems to have leveled off.  He does get intermittently agitated but can be redirected.  He has not had any violent behavior, delusions or hallucinations.  Is mostly independent in most activities of daily living.  He can feed himself dress himself.  He is still driving.  He has not had any recurrent stroke or TIA symptoms.  He had MRI scan of the brain done on 06/04/2018  which I personally reviewed shows changes of small vessel disease and mild generalized atrophy and no acute abnormality.  Lab work done on 05/09/2018 showed elevated LDL cholesterol of 96 mg percent and hemoglobin A1c was 7.0.  Patient's Lipitor dose was increased from 40 to 60 mg following these lab results and is tolerating it well without muscle aches or pains.  An EEG was ordered but has not been done due to constraints from the [place] pandemic.  He has no new complaints today. He has a loop recorder and so for paroxysmal A. fib has not yet been found.  He states his blood pressure and diabetes have been under good control. Update 11/05/2018 PS ; he returns for f/u after last virtual video visit 07/29/18.  Patient is accompanied by his daughter.  The patient is tolerating Aricept well but his caregiver friend who lives with him feels that he  gets upset at night and this may be related to the medication.  He had an EEG done following last visit on 08/26/2018 which was normal.  He continues to have mild cognitive difficulties but these are unchanged.  Is mostly independent and does a lot of work in his house and outdoors.  He has not had any recurrent stroke or TIA symptoms.  Loop recorder assessment from last week was negative for A. fib.  Is tolerating Lipitor well without muscle aches and pains.  Blood pressure is adequately controlled.  No new complaints.  No delusions, hallucinations or unsafe behavior     ROS:   14 system review of systems is positive for hearing loss, memory loss, confusion and all other systems negative  PMH:  Past Medical History:  Diagnosis Date  . Cancer ([place])    skin cancer  . Dementia ([place])   . Diabetes mellitus without complication ([place])   . Hypertension   . Memory deficit     Social History:  Social History   Socioeconomic History  . Marital status: Married    Spouse name: Not on file  . Number of children: Not on file  . Years of education: Not on file  . Highest education level: Not on file  Occupational History  . Not on file  Tobacco Use  . Smoking status: Never Smoker  . Smokeless tobacco: Never Used  Vaping Use  . Vaping Use: Never used  Substance and Sexual Activity  . Alcohol use: Yes    Comment: occ  . Drug use: No  . Sexual activity: Not on file  Other Topics Concern  . Not on file  Social History Narrative  . Not on file   Social Determinants of Health   Financial Resource Strain: Not on file  Food Insecurity: Not on file  Transportation Needs: Not on file  Physical Activity: Not on file  Stress: Not on file  Social Connections: Not on file  Intimate Partner Violence: Not on file    Medications:   Current Outpatient Medications on File Prior to Visit  Medication Sig Dispense Refill  . amLODipine (NORVASC) 5 MG tablet Take 5 mg by mouth 2 (two) times daily.     Marland Kitchen aspirin EC 81 MG tablet Take 81 mg by mouth daily.    Marland Kitchen atorvastatin (LIPITOR) 20 MG tablet Take 3 tablets (60 mg total) by mouth daily for 30 days. 90 tablet 1  . escitalopram (LEXAPRO) 20 MG tablet     . glipiZIDE (GLUCOTROL) 5 MG tablet Take 5 mg by mouth daily.    Marland Kitchen  memantine (NAMENDA) 10 MG tablet Take 1 tablet (10 mg total) by mouth 2 (two) times daily. 180 tablet 3  . metFORMIN (GLUCOPHAGE) 1000 MG tablet Take 1,000 mg by mouth 2 (two) times daily.      No current facility-administered medications on file prior to visit.    Allergies:   Allergies  Allergen Reactions  . Aricept [Donepezil]     Per daughter, increased mood swings and angry    Physical Exam  Today's Vitals   04/19/20 0859 04/19/20 0932  BP: (!) 169/73 (!) 142/62  Pulse: (!) 59   Weight: 190 lb (86.2 kg)   Height: 6' (1.829 m)    Body mass index is 25.77 kg/m.   General: well developed, well nourished very pleasant elderly Caucasian male, seated, in no evident distress Neck: supple with no carotid or supraclavicular bruits Ears: Cerumen impacted bilaterally Cardiovascular: regular rate and rhythm, no murmurs Vascular:  Normal pulses all extremities  Neurologic Exam Mental Status: Awake and fully alert.  Normal speech and language.  Recent memory diminished and remote memory intact. Attention span, concentration and fund of knowledge poor. Mood and affect appropriate.  MMSE - Mini Mental State Exam 04/19/2020 10/16/2019 04/10/2019  Orientation to time 0 0 1  Orientation to Place 3 2 2   Registration 3 3 3   Attention/ Calculation 0 0 0  Recall 0 0 0  Language- name 2 objects 2 2 2   Language- repeat 1 1 1   Language- follow 3 step command 2 3 3   Language- read & follow direction 1 1 1   Write a sentence 0 0 0  Copy design 0 0 0  Total score 12 12 13    Cranial Nerves: Pupils equal, briskly reactive to light. Extraocular movements full without nystagmus. Visual fields full to confrontation. Hearing  intact. Facial sensation intact. Face, tongue, palate moves normally and symmetrically.  Motor: Normal bulk and tone. Normal strength in all tested extremity muscles. Sensory.: intact to touch , pinprick , position and vibratory sensation.  Coordination: Rapid alternating movements normal in all extremities. Finger-to-nose and heel-to-shin performed accurately bilaterally. Gait and Station: Arises from chair without difficulty. Stance is normal. Gait demonstrates normal stride length and balance without use of assistive device Reflexes: 1+ and symmetric. Toes downgoing.       ASSESSMENT/PLAN: 80 year old Caucasian male with progressive memory loss and cognitive worsening likely due to vascular dementia post stroke.  Remote history of embolic left temporal infarct in May 2018 of cryptogenic etiology with loop recorder placement which has not shown atrial fibrillation thus far.  Vascular risk factors of diabetes, hypertension and prior stroke.     Vascular dementia without behaviors -MMSE today 12/30 (prior 12/30) -Continue Namenda 10 mg twice daily  - per daughter, PCP plans on taking over prescribing and monitoring -Unable to tolerate Aricept -Discussed importance of managing stroke risk factors such as HTN, HLD and DM as well as routine exercise, healthy diet and adequate sleep   History of stroke -Continuation of aspirin 81 mg daily and atorvastatin for secondary stroke prevention -Continue to follow with PCP for HTN, HLD and DM management -Loop recorder has not shown atrial fibrillation thus far. Monthly reports personally reviewed -maintain strict control of hypertension with blood pressure goal below 130/90, lipids with LDL cholesterol goal below 70 mg percent and diabetes with hemoglobin A1c goal below 7.0%.      Per request, follow-up on an as-needed basis as they plan on following up with PCP for stroke risk factor  management and vascular dementia to limit multiple appointment  burden    CC:  [facility] provider: Dr. Annia Belt, Nicole Kindred, MD    I spent 30 minutes of face-to-face and non-face-to-face time with patient and daughter.  This included previsit chart review, lab review, study review, order entry, electronic health record documentation, patient education regarding vascular dementia and ongoing use of Namenda, importance of managing stroke risk factors with prior stroke history, completion and review of MMSE and answered all other questions to patient and daughter satisfaction   Frann Rider, AGNP-BC  [facility] [address] [place], [place] [address]  Phone [phone] Fax [phone] Note: This document was prepared with digital dictation and possible smart phrase technology. Any transcriptional errors that result from this process are unintentional.

## 2020-04-20 NOTE — Progress Notes (Signed)
I agree with the above plan 

## 2020-04-27 NOTE — Progress Notes (Signed)
Carelink Summary Report / Loop Recorder 

## 2020-05-22 LAB — CUP PACEART REMOTE DEVICE CHECK
Date Time Interrogation Session: 20220323005958
Implantable Pulse Generator Implant Date: 20190530

## 2020-05-24 ENCOUNTER — Ambulatory Visit (INDEPENDENT_AMBULATORY_CARE_PROVIDER_SITE_OTHER): Payer: Medicare Other

## 2020-05-24 DIAGNOSIS — I639 Cerebral infarction, unspecified: Secondary | ICD-10-CM

## 2020-06-04 NOTE — Progress Notes (Signed)
Carelink Summary Report / Loop Recorder 

## 2020-06-10 ENCOUNTER — Other Ambulatory Visit: Payer: Self-pay

## 2020-06-10 ENCOUNTER — Emergency Department (HOSPITAL_COMMUNITY): Payer: Medicare Other

## 2020-06-10 ENCOUNTER — Encounter (HOSPITAL_COMMUNITY): Payer: Self-pay | Admitting: Emergency Medicine

## 2020-06-10 ENCOUNTER — Encounter (HOSPITAL_COMMUNITY): Payer: Self-pay

## 2020-06-10 ENCOUNTER — Emergency Department (HOSPITAL_COMMUNITY)
Admission: EM | Admit: 2020-06-10 | Discharge: 2020-06-10 | Disposition: A | Discharge status: Home / Self Care | Payer: Medicare Other | Source: Home / Self Care | Attending: Emergency Medicine | Admitting: Emergency Medicine

## 2020-06-10 ENCOUNTER — Inpatient Hospital Stay (HOSPITAL_COMMUNITY)
Admission: EM | Admit: 2020-06-10 | Discharge: 2020-07-14 | DRG: 884 | Disposition: A | Discharge status: Nursing Home (Includes ICF) | Payer: Medicare Other | Attending: Internal Medicine | Admitting: Internal Medicine

## 2020-06-10 ENCOUNTER — Ambulatory Visit (HOSPITAL_COMMUNITY)
Admission: RE | Admit: 2020-06-10 | Discharge: 2020-06-10 | Disposition: A | Discharge status: Home / Self Care | Payer: Medicare Other | Attending: Psychiatry | Admitting: Psychiatry

## 2020-06-10 ENCOUNTER — Other Ambulatory Visit: Payer: Self-pay | Admitting: Physician Assistant

## 2020-06-10 DIAGNOSIS — E119 Type 2 diabetes mellitus without complications: Secondary | ICD-10-CM | POA: Insufficient documentation

## 2020-06-10 DIAGNOSIS — N179 Acute kidney failure, unspecified: Secondary | ICD-10-CM | POA: Diagnosis present

## 2020-06-10 DIAGNOSIS — F0281 Dementia in other diseases classified elsewhere with behavioral disturbance: Secondary | ICD-10-CM | POA: Diagnosis present

## 2020-06-10 DIAGNOSIS — Z79899 Other long term (current) drug therapy: Secondary | ICD-10-CM | POA: Insufficient documentation

## 2020-06-10 DIAGNOSIS — Z791 Long term (current) use of non-steroidal anti-inflammatories (NSAID): Secondary | ICD-10-CM

## 2020-06-10 DIAGNOSIS — W57XXXA Bitten or stung by nonvenomous insect and other nonvenomous arthropods, initial encounter: Secondary | ICD-10-CM | POA: Diagnosis present

## 2020-06-10 DIAGNOSIS — R569 Unspecified convulsions: Secondary | ICD-10-CM | POA: Diagnosis present

## 2020-06-10 DIAGNOSIS — Z7982 Long term (current) use of aspirin: Secondary | ICD-10-CM | POA: Insufficient documentation

## 2020-06-10 DIAGNOSIS — G309 Alzheimer's disease, unspecified: Secondary | ICD-10-CM | POA: Diagnosis present

## 2020-06-10 DIAGNOSIS — R0600 Dyspnea, unspecified: Secondary | ICD-10-CM

## 2020-06-10 DIAGNOSIS — F0150 Vascular dementia without behavioral disturbance: Secondary | ICD-10-CM | POA: Diagnosis present

## 2020-06-10 DIAGNOSIS — Z781 Physical restraint status: Secondary | ICD-10-CM

## 2020-06-10 DIAGNOSIS — G9349 Other encephalopathy: Secondary | ICD-10-CM | POA: Diagnosis present

## 2020-06-10 DIAGNOSIS — Z9181 History of falling: Secondary | ICD-10-CM

## 2020-06-10 DIAGNOSIS — U071 COVID-19: Principal | ICD-10-CM | POA: Diagnosis present

## 2020-06-10 DIAGNOSIS — R4182 Altered mental status, unspecified: Secondary | ICD-10-CM | POA: Insufficient documentation

## 2020-06-10 DIAGNOSIS — Z23 Encounter for immunization: Secondary | ICD-10-CM

## 2020-06-10 DIAGNOSIS — Z7984 Long term (current) use of oral hypoglycemic drugs: Secondary | ICD-10-CM | POA: Insufficient documentation

## 2020-06-10 DIAGNOSIS — Z85828 Personal history of other malignant neoplasm of skin: Secondary | ICD-10-CM | POA: Insufficient documentation

## 2020-06-10 DIAGNOSIS — G9341 Metabolic encephalopathy: Secondary | ICD-10-CM | POA: Diagnosis present

## 2020-06-10 DIAGNOSIS — Z66 Do not resuscitate: Secondary | ICD-10-CM | POA: Diagnosis present

## 2020-06-10 DIAGNOSIS — Z6825 Body mass index (BMI) 25.0-25.9, adult: Secondary | ICD-10-CM

## 2020-06-10 DIAGNOSIS — R1310 Dysphagia, unspecified: Secondary | ICD-10-CM | POA: Diagnosis present

## 2020-06-10 DIAGNOSIS — E876 Hypokalemia: Secondary | ICD-10-CM | POA: Diagnosis present

## 2020-06-10 DIAGNOSIS — Z888 Allergy status to other drugs, medicaments and biological substances status: Secondary | ICD-10-CM

## 2020-06-10 DIAGNOSIS — F0390 Unspecified dementia without behavioral disturbance: Secondary | ICD-10-CM | POA: Insufficient documentation

## 2020-06-10 DIAGNOSIS — R531 Weakness: Secondary | ICD-10-CM | POA: Insufficient documentation

## 2020-06-10 DIAGNOSIS — I10 Essential (primary) hypertension: Secondary | ICD-10-CM | POA: Insufficient documentation

## 2020-06-10 DIAGNOSIS — E440 Moderate protein-calorie malnutrition: Secondary | ICD-10-CM | POA: Diagnosis present

## 2020-06-10 DIAGNOSIS — R410 Disorientation, unspecified: Secondary | ICD-10-CM

## 2020-06-10 DIAGNOSIS — F0151 Vascular dementia with behavioral disturbance: Secondary | ICD-10-CM | POA: Diagnosis not present

## 2020-06-10 DIAGNOSIS — R0602 Shortness of breath: Secondary | ICD-10-CM

## 2020-06-10 DIAGNOSIS — I4891 Unspecified atrial fibrillation: Secondary | ICD-10-CM | POA: Diagnosis present

## 2020-06-10 DIAGNOSIS — R29810 Facial weakness: Secondary | ICD-10-CM | POA: Diagnosis not present

## 2020-06-10 DIAGNOSIS — E538 Deficiency of other specified B group vitamins: Secondary | ICD-10-CM | POA: Diagnosis present

## 2020-06-10 DIAGNOSIS — R6510 Systemic inflammatory response syndrome (SIRS) of non-infectious origin without acute organ dysfunction: Secondary | ICD-10-CM | POA: Diagnosis present

## 2020-06-10 DIAGNOSIS — Z8673 Personal history of transient ischemic attack (TIA), and cerebral infarction without residual deficits: Secondary | ICD-10-CM

## 2020-06-10 DIAGNOSIS — K219 Gastro-esophageal reflux disease without esophagitis: Secondary | ICD-10-CM | POA: Diagnosis present

## 2020-06-10 DIAGNOSIS — F05 Delirium due to known physiological condition: Secondary | ICD-10-CM | POA: Diagnosis present

## 2020-06-10 LAB — I-STAT CHEM 8, ED
BUN: 47 mg/dL — ABNORMAL HIGH (ref 8–23)
Calcium, Ion: 1.19 mmol/L (ref 1.15–1.40)
Chloride: 109 mmol/L (ref 98–111)
Creatinine, Ser: 1.5 mg/dL — ABNORMAL HIGH (ref 0.61–1.24)
Glucose, Bld: 287 mg/dL — ABNORMAL HIGH (ref 70–99)
HCT: 45 % (ref 39.0–52.0)
Hemoglobin: 15.3 g/dL (ref 13.0–17.0)
Potassium: 4.5 mmol/L (ref 3.5–5.1)
Sodium: 140 mmol/L (ref 135–145)
TCO2: 18 mmol/L — ABNORMAL LOW (ref 22–32)

## 2020-06-10 LAB — URINALYSIS, ROUTINE W REFLEX MICROSCOPIC
Bilirubin Urine: NEGATIVE
Glucose, UA: 150 mg/dL — AB
Hgb urine dipstick: NEGATIVE
Ketones, ur: NEGATIVE mg/dL
Leukocytes,Ua: NEGATIVE
Nitrite: NEGATIVE
Protein, ur: NEGATIVE mg/dL
Specific Gravity, Urine: 1.028 (ref 1.005–1.030)
pH: 5 (ref 5.0–8.0)

## 2020-06-10 LAB — COMPREHENSIVE METABOLIC PANEL
ALT: 30 U/L (ref 0–44)
AST: 29 U/L (ref 15–41)
Albumin: 4.1 g/dL (ref 3.5–5.0)
Alkaline Phosphatase: 51 U/L (ref 38–126)
Anion gap: 13 (ref 5–15)
BUN: 50 mg/dL — ABNORMAL HIGH (ref 8–23)
CO2: 16 mmol/L — ABNORMAL LOW (ref 22–32)
Calcium: 8.7 mg/dL — ABNORMAL LOW (ref 8.9–10.3)
Chloride: 107 mmol/L (ref 98–111)
Creatinine, Ser: 1.53 mg/dL — ABNORMAL HIGH (ref 0.61–1.24)
GFR, Estimated: 46 mL/min — ABNORMAL LOW (ref >60)
Glucose, Bld: 297 mg/dL — ABNORMAL HIGH (ref 70–99)
Potassium: 4.4 mmol/L (ref 3.5–5.1)
Sodium: 136 mmol/L (ref 135–145)
Total Bilirubin: 0.6 mg/dL (ref 0.3–1.2)
Total Protein: 7.2 g/dL (ref 6.5–8.1)

## 2020-06-10 LAB — LACTIC ACID, PLASMA
Lactic Acid, Venous: 2.9 mmol/L (ref 0.5–1.9)
Lactic Acid, Venous: 1.8 mmol/L (ref 0.5–1.9)

## 2020-06-10 LAB — CK: Total CK: 123 U/L (ref 49–397)

## 2020-06-10 LAB — CBG MONITORING, ED
Glucose-Capillary: 283 mg/dL — ABNORMAL HIGH (ref 70–99)
Glucose-Capillary: 118 mg/dL — ABNORMAL HIGH (ref 70–99)

## 2020-06-10 LAB — TROPONIN I (HIGH SENSITIVITY)
Troponin I (High Sensitivity): 5 ng/L (ref <18)
Troponin I (High Sensitivity): 5 ng/L (ref <18)

## 2020-06-10 LAB — LIPASE, BLOOD: Lipase: 39 U/L (ref 11–51)

## 2020-06-10 MED ORDER — SODIUM CHLORIDE 0.9 % IV SOLN: INTRAVENOUS | Status: DC

## 2020-06-10 MED ORDER — INSULIN ASPART 100 UNIT/ML ~~LOC~~ SOLN
10.0000 [IU] | Freq: Once | SUBCUTANEOUS | Status: AC
  Administered 2020-06-10: 10 [IU] via INTRAVENOUS
  Filled 2020-06-10: qty 1

## 2020-06-10 MED ORDER — SODIUM CHLORIDE 0.9 % IV BOLUS
500.0000 mL | Freq: Once | INTRAVENOUS | Status: AC
  Administered 2020-06-10: 500 mL via INTRAVENOUS

## 2020-06-10 MED ORDER — HALOPERIDOL LACTATE 5 MG/ML IJ SOLN
2.0000 mg | Freq: Once | INTRAMUSCULAR | Status: AC
  Administered 2020-06-10: 2 mg via INTRAMUSCULAR
  Filled 2020-06-10: qty 1

## 2020-06-10 NOTE — ED Provider Notes (Signed)
[facility] EMERGENCY DEPARTMENT Provider Note   CSN: [id number] Arrival date & time: 06/10/20  1003     History Chief Complaint  Patient presents with  . Altered Mental Status    Patrick Phillips is a 80 y.o. male.  Patient brought in by family member.  Patient normally walks every morning.  He kind of went missing.  Usually walks his dog dog appeared back at the house.  State trooper found the patient lying along the roadside.  Patient seemed to be confused.  No evidence of any immediate trauma.  Patient's past medical history significant for dementia diabetes hypertension known to have memory deficit.  History of skin cancer.  Upon arrival patient seems to be confused.  Not super alert but will follow commands without any difficulty.  Was 99.4 heart rate was 105 blood pressure was 161/91 oxygen sats were 99% on room air.        Past Medical History:  Diagnosis Date  . Cancer ([place])    skin cancer  . Dementia ([place])   . Diabetes mellitus without complication ([place])   . Hypertension   . Memory deficit     Patient Active Problem List   Diagnosis Date Noted  . Acute ischemic stroke ([place]) 07/24/2017  . Chest pain 12/22/2015  . Chronic back pain 12/22/2015  . Syncope 10/17/2013  . Sinus bradycardia 10/17/2013  . SOB (shortness of breath) 10/17/2013  . Hypertension   . Diabetes mellitus without complication [facility])     Past Surgical History:  Procedure Laterality Date  . BACK SURGERY    . HERNIA REPAIR    . LOOP RECORDER INSERTION N/A 07/26/2017   Procedure: LOOP RECORDER INSERTION;  Surgeon: Evans Lance, MD;  Location: [place] CV LAB;  Service: Cardiovascular;  Laterality: N/A;  . TEE WITHOUT CARDIOVERSION N/A 07/26/2017   Procedure: TRANSESOPHAGEAL ECHOCARDIOGRAM (TEE);  Surgeon: Fay Records, MD;  Location[facility] ENDOSCOPY;  Service: Cardiovascular;  Laterality: N/A;  . TRIGGER FINGER RELEASE Right 12/02/2015   Procedure: RIGHT THUMB RELEASE TRIGGER FINGER/A-1 PULLEY;   Surgeon: Leanora Cover, MD;  Location: [place];  Service: Orthopedics;  Laterality: Right;       Family History  Problem Relation Age of Onset  . Cancer Mother   . Cancer Father   . Cancer Brother   . Cancer Other     Social History   Tobacco Use  . Smoking status: Never Smoker  . Smokeless tobacco: Never Used  Vaping Use  . Vaping Use: Never used  Substance Use Topics  . Alcohol use: Yes    Comment: occ  . Drug use: No    Home Medications Prior to Admission medications   Medication Sig Start Date End Date Taking? Authorizing Provider  amLODipine (NORVASC) 5 MG tablet Take 5 mg by mouth 2 (two) times daily. 05/30/17  Yes [provider]  aspirin EC 81 MG tablet Take 81 mg by mouth daily.   Yes [provider]  atorvastatin (LIPITOR) 20 MG tablet Take 3 tablets (60 mg total) by mouth daily for 30 days. 07/29/18 08/28/18 Yes Garvin Fila, MD  escitalopram (LEXAPRO) 20 MG tablet  04/30/18  Yes [provider]  esomeprazole (NEXIUM) 40 MG capsule Take 40 mg by mouth daily. 05/25/20  Yes [provider]  glipiZIDE (GLUCOTROL) 5 MG tablet Take 5 mg by mouth daily. 07/11/17  Yes [provider]  meloxicam (MOBIC) 15 MG tablet Take 15 mg by mouth daily.  Yes [provider]  memantine (NAMENDA) 10 MG tablet Take 1 tablet (10 mg total) by mouth 2 (two) times daily. 10/16/19  Yes McCue, Janett Billow, NP  metFORMIN (GLUCOPHAGE) 1000 MG tablet Take 1,000 mg by mouth 2 (two) times daily.  07/31/13  Yes [provider]    Allergies    Aricept [donepezil]  Review of Systems   Review of Systems  Unable to perform ROS: Mental status change    Physical Exam Updated Vital Signs BP (!) 136/43   Pulse 69   Temp 99.4 F (37.4 C)   Resp (!) 24   Ht 1.829 m (6')   Wt 86.2 kg   SpO2 96%   BMI 25.77 kg/m   Physical Exam Vitals and nursing note reviewed.  Constitutional:      Appearance: Normal appearance. He is  well-developed.  HENT:     Head: Normocephalic and atraumatic.  Eyes:     Extraocular Movements: Extraocular movements intact.     Conjunctiva/sclera: Conjunctivae normal.     Pupils: Pupils are equal, round, and reactive to light.  Cardiovascular:     Rate and Rhythm: Normal rate and regular rhythm.     Heart sounds: No murmur heard.   Pulmonary:     Effort: Pulmonary effort is normal. No respiratory distress.     Breath sounds: Normal breath sounds.  Abdominal:     Palpations: Abdomen is soft.     Tenderness: There is no abdominal tenderness.  Musculoskeletal:     Cervical back: Neck supple.  Skin:    General: Skin is warm and dry.     Capillary Refill: Capillary refill takes 2 to 3 seconds.  Neurological:     Mental Status: He is alert.     Cranial Nerves: No cranial nerve deficit.     Sensory: No sensory deficit.     Motor: Weakness present.     Coordination: Coordination normal.     Comments: Cording to family patient with decreased mental status status.  But he will follow commands precisely.  Patient may be with some weakness to the right lower extremity. Patient able to verbalize.  Does seem to be confused.     ED Results / Procedures / Treatments   Labs (all labs ordered are listed, but only abnormal results are displayed) Labs Reviewed  URINALYSIS, ROUTINE W REFLEX MICROSCOPIC - Abnormal; Notable for the following components:      Result Value   APPearance HAZY (*)    Glucose, UA 150 (*)    All other components within normal limits  LACTIC ACID, PLASMA - Abnormal; Notable for the following components:   Lactic Acid, Venous 2.9 (*)    All other components within normal limits  COMPREHENSIVE METABOLIC PANEL - Abnormal; Notable for the following components:   CO2 16 (*)    Glucose, Bld 297 (*)    BUN 50 (*)    Creatinine, Ser 1.53 (*)    Calcium 8.7 (*)    GFR, Estimated 46 (*)    All other components within normal limits  CBG MONITORING, ED - Abnormal;  Notable for the following components:   Glucose-Capillary 283 (*)    All other components within normal limits  I-STAT CHEM 8, ED - Abnormal; Notable for the following components:   BUN 47 (*)    Creatinine, Ser 1.50 (*)    Glucose, Bld 287 (*)    TCO2 18 (*)    All other components within normal limits  CULTURE,  BLOOD (ROUTINE X 2)  CULTURE, BLOOD (ROUTINE X 2)  URINE CULTURE  LIPASE, BLOOD  CK  LACTIC ACID, PLASMA  TROPONIN I (HIGH SENSITIVITY)  TROPONIN I (HIGH SENSITIVITY)    EKG None  Radiology CT Head Wo Contrast  Result Date: 06/10/2020 CLINICAL DATA:  Head and C-spine injury, fall, altered mental status EXAM: CT HEAD WITHOUT CONTRAST CT CERVICAL SPINE WITHOUT CONTRAST TECHNIQUE: Multidetector CT imaging of the head and cervical spine was performed following the standard protocol without intravenous contrast. Multiplanar CT image reconstructions of the cervical spine were also generated. COMPARISON:  05/04/2020 FINDINGS: CT HEAD FINDINGS Brain: Stable mild brain atrophy pattern. No acute intracranial hemorrhage, new mass lesion, acute infarction, midline shift, herniation, hydrocephalus, or extra-axial fluid collection. No focal mass effect or edema. Cisterns are patent. Cerebellar atrophy as well. Vascular: Intracranial atherosclerosis at the skull base. No hyperdense vessel. Skull: Normal. Negative for fracture or focal lesion. Sinuses/Orbits: Minor scattered sinus mucosal thickening as before. Orbits are symmetric. No acute finding. Other: None. CT CERVICAL SPINE FINDINGS Alignment: Normal. Skull base and vertebrae: No acute fracture. No primary bone lesion or focal pathologic process. Soft tissues and spinal canal: Normal prevertebral soft tissues. No hemorrhage or hematoma appreciated. Carotid atherosclerosis. Disc levels: Similar mild multilevel degenerative disc disease and facet arthropathy. Facets are aligned. No subluxation or dislocation. Preserved vertebral body heights.  Upper chest: Negative. Other: None. IMPRESSION: Stable brain atrophy pattern. No acute intracranial abnormality or interval change by noncontrast CT. Stable mild cervical degenerative changes as above. No acute osseous finding or malalignment by CT. Electronically Signed   By: Jerilynn Mages.  Shick M.D.   On: 06/10/2020 12:31   CT Cervical Spine Wo Contrast  Result Date: 06/10/2020 CLINICAL DATA:  Head and C-spine injury, fall, altered mental status EXAM: CT HEAD WITHOUT CONTRAST CT CERVICAL SPINE WITHOUT CONTRAST TECHNIQUE: Multidetector CT imaging of the head and cervical spine was performed following the standard protocol without intravenous contrast. Multiplanar CT image reconstructions of the cervical spine were also generated. COMPARISON:  05/04/2020 FINDINGS: CT HEAD FINDINGS Brain: Stable mild brain atrophy pattern. No acute intracranial hemorrhage, new mass lesion, acute infarction, midline shift, herniation, hydrocephalus, or extra-axial fluid collection. No focal mass effect or edema. Cisterns are patent. Cerebellar atrophy as well. Vascular: Intracranial atherosclerosis at the skull base. No hyperdense vessel. Skull: Normal. Negative for fracture or focal lesion. Sinuses/Orbits: Minor scattered sinus mucosal thickening as before. Orbits are symmetric. No acute finding. Other: None. CT CERVICAL SPINE FINDINGS Alignment: Normal. Skull base and vertebrae: No acute fracture. No primary bone lesion or focal pathologic process. Soft tissues and spinal canal: Normal prevertebral soft tissues. No hemorrhage or hematoma appreciated. Carotid atherosclerosis. Disc levels: Similar mild multilevel degenerative disc disease and facet arthropathy. Facets are aligned. No subluxation or dislocation. Preserved vertebral body heights. Upper chest: Negative. Other: None. IMPRESSION: Stable brain atrophy pattern. No acute intracranial abnormality or interval change by noncontrast CT. Stable mild cervical degenerative changes as  above. No acute osseous finding or malalignment by CT. Electronically Signed   By: Jerilynn Mages.  Shick M.D.   On: 06/10/2020 12:31   MR Brain Wo Contrast (neuro protocol)  Result Date: 06/10/2020 CLINICAL DATA:  Acute neuro deficit.  Mental status change. EXAM: MRI HEAD WITHOUT CONTRAST TECHNIQUE: Multiplanar, multiecho pulse sequences of the brain and surrounding structures were obtained without intravenous contrast. COMPARISON:  CT head 06/10/2020 FINDINGS: Brain: Ventricle size and cerebral volume normal for age. Negative for acute infarct. No significant chronic ischemic change. Negative  for hemorrhage or mass. Image quality degraded by mild motion. Vascular: Partial loss of flow void in the left internal carotid artery through the skull base and the distal left vertebral artery which may be due to slow flow or stenosis. Skull and upper cervical spine: Negative Sinuses/Orbits: Mild mucosal edema paranasal sinuses. No orbital mass. Bilateral exophthalmos. Other: None IMPRESSION: Negative for acute infarct. Partial loss of flow void in the left internal carotid artery distal left vertebral artery which could be due to proximal stenosis. Consider CT angio head and neck for further evaluation. Electronically Signed   By: Franchot Gallo M.D.   On: 06/10/2020 15:09   DG Chest Port 1 View  Result Date: 06/10/2020 CLINICAL DATA:  Altered mental status. EXAM: PORTABLE CHEST 1 VIEW COMPARISON:  May 25, 2020 FINDINGS: Loop recorder in place. Cardiomediastinal silhouette is normal. Mediastinal contours appear intact. Tortuosity and calcific atherosclerotic disease of the aorta. There is no evidence of focal airspace consolidation, pleural effusion or pneumothorax. Osseous structures are without acute abnormality. Soft tissues are grossly normal. IMPRESSION: No active disease. Electronically Signed   By: Fidela Salisbury M.D.   On: 06/10/2020 11:15   DG Hips Bilat W or Wo Pelvis 3-4 Views  Result Date:  06/10/2020 CLINICAL DATA:  Found on side of road, last seen normal at 2130 hours last night, fall, dementia, tenderness of hips bilaterally EXAM: DG HIP (WITH OR WITHOUT PELVIS) 3-4V BILAT COMPARISON:  None FINDINGS: Osseous demineralization. Hip and SI joint spaces preserved. No acute fracture, dislocation, or bone destruction. Mild degenerative disc disease changes at visualized lower lumbar spine. IMPRESSION: No acute osseous abnormalities. Electronically Signed   By: Lavonia Dana M.D.   On: 06/10/2020 11:48    Procedures Procedures   Patient became more alert as time went on here.  But still has some degree of confusion.  MRI head negative for stroke.  Did show some narrowing in the carotid and vertebral area which CT angio can be done as an outpatient.  X-rays of the hip and pelvis without any acute abnormalities.  CT head neck without any acute abnormalities.  Chest x-ray without any acute abnormalities.  Urinalysis negative.  Patient's blood sugar was up some here today.  Troponins negative.  Initial lactic acid elevated but repeat was less than 2.  BUN/creatinine was elevated some GFR down some at 46.  But not consistent with acute kidney injury.  Patient was given IV fluids here and also given some IV insulin.  CK total was 123 so no evidence of any significant abnormalities from being on the ground.  Patient daughter is okay with discharge home.  There was some history of some diarrhea abdomen soft nontender.  We had at her request ordered CT scan of the abdomen.  The patient has not been having frequent diarrhea here.  Abdomen is now nontender.  And the daughter says that she is okay with not having this done since has been uncooperative a try to get the CT scan done. Medications Ordered in ED Medications  0.9 %  sodium chloride infusion ( Intravenous New Bag/Given 06/10/20 1146)  sodium chloride 0.9 % bolus 500 mL (500 mLs Intravenous New Bag/Given 06/10/20 1147)  insulin aspart (novoLOG)  injection 10 Units (10 Units Intravenous Given 06/10/20 1238)    ED Course  I have reviewed the triage vital signs and the nursing notes.  Pertinent labs & imaging results that were available during my care of the patient were reviewed by me and  considered in my medical decision making (see chart for details).    MDM Rules/Calculators/A&P                         See above under procedures.    Final Clinical Impression(s) / ED Diagnoses Final diagnoses:  Altered mental status, unspecified altered mental status type    Rx / DC Orders ED Discharge Orders    None       Fredia Sorrow, MD 06/10/20 1540

## 2020-06-10 NOTE — ED Triage Notes (Signed)
Pt was found to be on the side of the road. Pt was last seen at his home at 2130 last night. Pt has HX of dementia. Neighbor found pt laying on the side of the road. Unaware if pt was unresponsive or if pt hit his head

## 2020-06-10 NOTE — ED Provider Notes (Signed)
[facility] EMERGENCY DEPARTMENT Provider Note   CSN: [id number] Arrival date & time: 06/10/20  2249     History No chief complaint on file.   Patrick Phillips is a 80 y.o. male.  Patient BIB family member who provides his care reporting significantly abnormal behavior today including confusion, thinking he is walking his dog, having wandered away from the home, found having defecated and urinated on himself, combative with family. Daughter reports this is a sharp mental decline from his normal. Does have dementia but has never been this confused. Seen at [facility] earlier today with full stroke/AMS work up and ultimately discharged home without finding of a medical condition that warranted admission. Daughter states he continues to decline, becoming more combative, having visual hallucinations. He was taken to [facility] and sent here for medical clearance and to await geri-psych placement.   The history is provided by the patient. No language interpreter was used.       Past Medical History:  Diagnosis Date  . Cancer ([place])    skin cancer  . Dementia ([place])   . Diabetes mellitus without complication ([place])   . Hypertension   . Memory deficit     Patient Active Problem List   Diagnosis Date Noted  . Acute ischemic stroke ([place]) 07/24/2017  . Chest pain 12/22/2015  . Chronic back pain 12/22/2015  . Syncope 10/17/2013  . Sinus bradycardia 10/17/2013  . SOB (shortness of breath) 10/17/2013  . Hypertension   . Diabetes mellitus without complication [facility])     Past Surgical History:  Procedure Laterality Date  . BACK SURGERY    . HERNIA REPAIR    . LOOP RECORDER INSERTION N/A 07/26/2017   Procedure: LOOP RECORDER INSERTION;  Surgeon: Evans Lance, MD;  Location: [place] CV LAB;  Service: Cardiovascular;  Laterality: N/A;  . TEE WITHOUT CARDIOVERSION N/A 07/26/2017   Procedure: TRANSESOPHAGEAL ECHOCARDIOGRAM (TEE);  Surgeon: Fay Records, MD;   Location: [facility] ENDOSCOPY;  Service: Cardiovascular;  Laterality: N/A;  . TRIGGER FINGER RELEASE Right 12/02/2015   Procedure: RIGHT THUMB RELEASE TRIGGER FINGER/A-1 PULLEY;  Surgeon: Leanora Cover, MD;  Location: [place];  Service: Orthopedics;  Laterality: Right;       Family History  Problem Relation Age of Onset  . Cancer Mother   . Cancer Father   . Cancer Brother   . Cancer Other     Social History   Tobacco Use  . Smoking status: Never Smoker  . Smokeless tobacco: Never Used  Vaping Use  . Vaping Use: Never used  Substance Use Topics  . Alcohol use: Yes    Comment: occ  . Drug use: No    Home Medications Prior to Admission medications   Medication Sig Start Date End Date Taking? Authorizing Provider  amLODipine (NORVASC) 5 MG tablet Take 5 mg by mouth 2 (two) times daily. 05/30/17   [provider]  aspirin EC 81 MG tablet Take 81 mg by mouth daily.    [provider]  atorvastatin (LIPITOR) 20 MG tablet Take 3 tablets (60 mg total) by mouth daily for 30 days. 07/29/18 08/28/18  Garvin Fila, MD  escitalopram (LEXAPRO) 20 MG tablet  04/30/18   [provider]  esomeprazole (NEXIUM) 40 MG capsule Take 40 mg by mouth daily. 05/25/20   [provider]  glipiZIDE (GLUCOTROL) 5 MG tablet Take 5 mg by mouth daily. 07/11/17   [provider]  meloxicam ([place]) 15  MG tablet Take 15 mg by mouth daily.    [provider]  memantine (NAMENDA) 10 MG tablet Take 1 tablet (10 mg total) by mouth 2 (two) times daily. 10/16/19   Frann Rider, NP  metFORMIN (GLUCOPHAGE) 1000 MG tablet Take 1,000 mg by mouth 2 (two) times daily.  07/31/13   [provider]    Allergies    Aricept [donepezil]  Review of Systems   Review of Systems  Unable to perform ROS: Mental status change    Physical Exam Updated Vital Signs BP (!) 169/82 (BP Location: Right Arm)   Pulse 72   Temp 99.6 F (37.6 C) (Oral)   Resp 17   SpO2  98%   Physical Exam Vitals and nursing note reviewed.  Constitutional:      General: He is not in acute distress.    Appearance: He is well-developed.  HENT:     Head: Normocephalic and atraumatic.  Cardiovascular:     Rate and Rhythm: Normal rate and regular rhythm.  Pulmonary:     Effort: Pulmonary effort is normal. No respiratory distress.     Breath sounds: Normal breath sounds.  Abdominal:     General: Bowel sounds are normal.     Palpations: Abdomen is soft.     Tenderness: There is no abdominal tenderness. There is no guarding or rebound.  Musculoskeletal:        General: Normal range of motion.     Cervical back: Normal range of motion and neck supple.  Skin:    General: Skin is warm and dry.     Findings: No rash.  Neurological:     Mental Status: He is alert.     Comments: Disoriented to time and place  Psychiatric:        Attention and Perception: He perceives visual hallucinations.        Mood and Affect: Affect is blunt.        Speech: Speech is delayed.        Behavior: Behavior is agitated and slowed.     ED Results / Procedures / Treatments   Labs (all labs ordered are listed, but only abnormal results are displayed) Labs Reviewed - No data to display  EKG None  Radiology CT Head Wo Contrast  Result Date: 06/10/2020 CLINICAL DATA:  Head and C-spine injury, fall, altered mental status EXAM: CT HEAD WITHOUT CONTRAST CT CERVICAL SPINE WITHOUT CONTRAST TECHNIQUE: Multidetector CT imaging of the head and cervical spine was performed following the standard protocol without intravenous contrast. Multiplanar CT image reconstructions of the cervical spine were also generated. COMPARISON:  05/04/2020 FINDINGS: CT HEAD FINDINGS Brain: Stable mild brain atrophy pattern. No acute intracranial hemorrhage, new mass lesion, acute infarction, midline shift, herniation, hydrocephalus, or extra-axial fluid collection. No focal mass effect or edema. Cisterns are patent.  Cerebellar atrophy as well. Vascular: Intracranial atherosclerosis at the skull base. No hyperdense vessel. Skull: Normal. Negative for fracture or focal lesion. Sinuses/Orbits: Minor scattered sinus mucosal thickening as before. Orbits are symmetric. No acute finding. Other: None. CT CERVICAL SPINE FINDINGS Alignment: Normal. Skull base and vertebrae: No acute fracture. No primary bone lesion or focal pathologic process. Soft tissues and spinal canal: Normal prevertebral soft tissues. No hemorrhage or hematoma appreciated. Carotid atherosclerosis. Disc levels: Similar mild multilevel degenerative disc disease and facet arthropathy. Facets are aligned. No subluxation or dislocation. Preserved vertebral body heights. Upper chest: Negative. Other: None. IMPRESSION: Stable brain atrophy pattern. No acute intracranial abnormality or  interval change by noncontrast CT. Stable mild cervical degenerative changes as above. No acute osseous finding or malalignment by CT. Electronically Signed   By: Jerilynn Mages.  Shick M.D.   On: 06/10/2020 12:31   CT Cervical Spine Wo Contrast  Result Date: 06/10/2020 CLINICAL DATA:  Head and C-spine injury, fall, altered mental status EXAM: CT HEAD WITHOUT CONTRAST CT CERVICAL SPINE WITHOUT CONTRAST TECHNIQUE: Multidetector CT imaging of the head and cervical spine was performed following the standard protocol without intravenous contrast. Multiplanar CT image reconstructions of the cervical spine were also generated. COMPARISON:  05/04/2020 FINDINGS: CT HEAD FINDINGS Brain: Stable mild brain atrophy pattern. No acute intracranial hemorrhage, new mass lesion, acute infarction, midline shift, herniation, hydrocephalus, or extra-axial fluid collection. No focal mass effect or edema. Cisterns are patent. Cerebellar atrophy as well. Vascular: Intracranial atherosclerosis at the skull base. No hyperdense vessel. Skull: Normal. Negative for fracture or focal lesion. Sinuses/Orbits: Minor scattered sinus  mucosal thickening as before. Orbits are symmetric. No acute finding. Other: None. CT CERVICAL SPINE FINDINGS Alignment: Normal. Skull base and vertebrae: No acute fracture. No primary bone lesion or focal pathologic process. Soft tissues and spinal canal: Normal prevertebral soft tissues. No hemorrhage or hematoma appreciated. Carotid atherosclerosis. Disc levels: Similar mild multilevel degenerative disc disease and facet arthropathy. Facets are aligned. No subluxation or dislocation. Preserved vertebral body heights. Upper chest: Negative. Other: None. IMPRESSION: Stable brain atrophy pattern. No acute intracranial abnormality or interval change by noncontrast CT. Stable mild cervical degenerative changes as above. No acute osseous finding or malalignment by CT. Electronically Signed   By: Jerilynn Mages.  Shick M.D.   On: 06/10/2020 12:31   MR Brain Wo Contrast (neuro protocol)  Result Date: 06/10/2020 CLINICAL DATA:  Acute neuro deficit.  Mental status change. EXAM: MRI HEAD WITHOUT CONTRAST TECHNIQUE: Multiplanar, multiecho pulse sequences of the brain and surrounding structures were obtained without intravenous contrast. COMPARISON:  CT head 06/10/2020 FINDINGS: Brain: Ventricle size and cerebral volume normal for age. Negative for acute infarct. No significant chronic ischemic change. Negative for hemorrhage or mass. Image quality degraded by mild motion. Vascular: Partial loss of flow void in the left internal carotid artery through the skull base and the distal left vertebral artery which may be due to slow flow or stenosis. Skull and upper cervical spine: Negative Sinuses/Orbits: Mild mucosal edema paranasal sinuses. No orbital mass. Bilateral exophthalmos. Other: None IMPRESSION: Negative for acute infarct. Partial loss of flow void in the left internal carotid artery distal left vertebral artery which could be due to proximal stenosis. Consider CT angio head and neck for further evaluation. Electronically Signed    By: Franchot Gallo M.D.   On: 06/10/2020 15:09   DG Chest Port 1 View  Result Date: 06/10/2020 CLINICAL DATA:  Altered mental status. EXAM: PORTABLE CHEST 1 VIEW COMPARISON:  May 25, 2020 FINDINGS: Loop recorder in place. Cardiomediastinal silhouette is normal. Mediastinal contours appear intact. Tortuosity and calcific atherosclerotic disease of the aorta. There is no evidence of focal airspace consolidation, pleural effusion or pneumothorax. Osseous structures are without acute abnormality. Soft tissues are grossly normal. IMPRESSION: No active disease. Electronically Signed   By: Fidela Salisbury M.D.   On: 06/10/2020 11:15   DG Hips Bilat W or Wo Pelvis 3-4 Views  Result Date: 06/10/2020 CLINICAL DATA:  Found on side of road, last seen normal at 2130 hours last night, fall, dementia, tenderness of hips bilaterally EXAM: DG HIP (WITH OR WITHOUT PELVIS) 3-4V BILAT COMPARISON:  None FINDINGS:  Osseous demineralization. Hip and SI joint spaces preserved. No acute fracture, dislocation, or bone destruction. Mild degenerative disc disease changes at visualized lower lumbar spine. IMPRESSION: No acute osseous abnormalities. Electronically Signed   By: Lavonia Dana M.D.   On: 06/10/2020 11:48    Procedures Procedures CRITICAL CARE Performed by: Dewaine Oats   Total critical care time: 45 minutes  Critical care time was exclusive of separately billable procedures and treating other patients.  Critical care was necessary to treat or prevent imminent or life-threatening deterioration.  Critical care was time spent personally by me on the following activities: development of treatment plan with patient and/or surrogate as well as nursing, discussions with consultants, evaluation of patient's response to treatment, examination of patient, obtaining history from patient or surrogate, ordering and performing treatments and interventions, ordering and review of laboratory studies, ordering and  review of radiographic studies, pulse oximetry and re-evaluation of patient's condition.   Medications Ordered in ED Medications - No data to display  ED Course  I have reviewed the triage vital signs and the nursing notes.  Pertinent labs & imaging results that were available during my care of the patient were reviewed by me and considered in my medical decision making (see chart for details).    MDM Rules/Calculators/A&P                          Patient to ED with history as per HPI.  Chart reviewed. Labs done earlier today including Lactic acid, UA with Cx, trop, blood cultures, CK, lipase, CMET, CBG, CT head and c-spine, chest xray, MRI braiin, bil hips, EKG. CBC was not reported and is added on at this time.   On arrival, he is difficult to direct. He believes it is 2002. He knows his name and who his daughter is. He believes he sees his dog lying next to him and repeatedly tries to bend down to touch him.   TTS consult requested to initiate geri-psych placement.  After TTW consultation requested, the patient is found to be febrile to 102.1 causing concern for sepsis given confused state despite complete work up earlier today. Labs redrawn. Dr. Christy Gentles has seen and evaluated the patient. LP planned. Antibiotics started, cultures included in lab orders.  The patient required medications for sedation as he became increasingly agitated, and threatening to staff.   COVID positive result. LP performed and results pending. The hospitalist was consulted for admission and accepts the patient onto their service. VSS.   Final Clinical Impression(s) / ED Diagnoses Final diagnoses:  None   1. Confusion 2. History of dementia  Rx / DC Orders ED Discharge Orders    None       Charlann Lange, PA-C 06/11/20 0510    Margette Fast, MD 06/13/20 [phone]

## 2020-06-10 NOTE — ED Triage Notes (Signed)
Patient seen at [facility] for AMS, back here with his wife who reports he is increasingly confused.

## 2020-06-10 NOTE — ED Notes (Signed)
Pt unable to sign MSE waiver, pt HX of dementia

## 2020-06-10 NOTE — Discharge Instructions (Addendum)
To work-up today for the confusion and the possible fall without any acute findings.  MRI negative for stroke.  But did show some narrowing in the carotid and vertebral arteries.  So his regular doctor could arrange for CT angio to evaluate these further.  With this should not have any impact on today's presentation.  Chest x-ray negative for pneumonia urinalysis negative.  No signs of any serious infection.  Return for any new or worse symptoms

## 2020-06-11 ENCOUNTER — Encounter: Payer: Self-pay | Admitting: Adult Health

## 2020-06-11 ENCOUNTER — Other Ambulatory Visit (HOSPITAL_COMMUNITY): Payer: Medicare Other

## 2020-06-11 ENCOUNTER — Emergency Department (HOSPITAL_COMMUNITY): Payer: Medicare Other

## 2020-06-11 DIAGNOSIS — U071 COVID-19: Secondary | ICD-10-CM

## 2020-06-11 DIAGNOSIS — Z85828 Personal history of other malignant neoplasm of skin: Secondary | ICD-10-CM | POA: Diagnosis not present

## 2020-06-11 DIAGNOSIS — R29810 Facial weakness: Secondary | ICD-10-CM | POA: Diagnosis not present

## 2020-06-11 DIAGNOSIS — R1310 Dysphagia, unspecified: Secondary | ICD-10-CM | POA: Diagnosis present

## 2020-06-11 DIAGNOSIS — F0281 Dementia in other diseases classified elsewhere with behavioral disturbance: Secondary | ICD-10-CM | POA: Diagnosis present

## 2020-06-11 DIAGNOSIS — F0150 Vascular dementia without behavioral disturbance: Secondary | ICD-10-CM | POA: Diagnosis present

## 2020-06-11 DIAGNOSIS — Z6825 Body mass index (BMI) 25.0-25.9, adult: Secondary | ICD-10-CM | POA: Diagnosis not present

## 2020-06-11 DIAGNOSIS — F0151 Vascular dementia with behavioral disturbance: Secondary | ICD-10-CM | POA: Diagnosis present

## 2020-06-11 DIAGNOSIS — R410 Disorientation, unspecified: Secondary | ICD-10-CM | POA: Diagnosis not present

## 2020-06-11 DIAGNOSIS — R4182 Altered mental status, unspecified: Secondary | ICD-10-CM | POA: Diagnosis present

## 2020-06-11 DIAGNOSIS — F05 Delirium due to known physiological condition: Secondary | ICD-10-CM | POA: Diagnosis present

## 2020-06-11 DIAGNOSIS — G9341 Metabolic encephalopathy: Secondary | ICD-10-CM | POA: Diagnosis present

## 2020-06-11 DIAGNOSIS — E440 Moderate protein-calorie malnutrition: Secondary | ICD-10-CM | POA: Diagnosis present

## 2020-06-11 DIAGNOSIS — R6510 Systemic inflammatory response syndrome (SIRS) of non-infectious origin without acute organ dysfunction: Secondary | ICD-10-CM | POA: Diagnosis present

## 2020-06-11 DIAGNOSIS — N179 Acute kidney failure, unspecified: Secondary | ICD-10-CM | POA: Diagnosis present

## 2020-06-11 DIAGNOSIS — Z7984 Long term (current) use of oral hypoglycemic drugs: Secondary | ICD-10-CM | POA: Diagnosis not present

## 2020-06-11 DIAGNOSIS — E538 Deficiency of other specified B group vitamins: Secondary | ICD-10-CM | POA: Diagnosis present

## 2020-06-11 DIAGNOSIS — K219 Gastro-esophageal reflux disease without esophagitis: Secondary | ICD-10-CM | POA: Diagnosis present

## 2020-06-11 DIAGNOSIS — Z8673 Personal history of transient ischemic attack (TIA), and cerebral infarction without residual deficits: Secondary | ICD-10-CM | POA: Diagnosis not present

## 2020-06-11 DIAGNOSIS — Z515 Encounter for palliative care: Secondary | ICD-10-CM | POA: Diagnosis not present

## 2020-06-11 DIAGNOSIS — W57XXXA Bitten or stung by nonvenomous insect and other nonvenomous arthropods, initial encounter: Secondary | ICD-10-CM | POA: Diagnosis present

## 2020-06-11 DIAGNOSIS — E119 Type 2 diabetes mellitus without complications: Secondary | ICD-10-CM | POA: Diagnosis present

## 2020-06-11 DIAGNOSIS — Z66 Do not resuscitate: Secondary | ICD-10-CM | POA: Diagnosis present

## 2020-06-11 DIAGNOSIS — E876 Hypokalemia: Secondary | ICD-10-CM | POA: Diagnosis present

## 2020-06-11 DIAGNOSIS — G309 Alzheimer's disease, unspecified: Secondary | ICD-10-CM | POA: Diagnosis present

## 2020-06-11 DIAGNOSIS — I10 Essential (primary) hypertension: Secondary | ICD-10-CM

## 2020-06-11 DIAGNOSIS — G9349 Other encephalopathy: Secondary | ICD-10-CM | POA: Diagnosis present

## 2020-06-11 DIAGNOSIS — Z23 Encounter for immunization: Secondary | ICD-10-CM | POA: Diagnosis present

## 2020-06-11 DIAGNOSIS — Z7189 Other specified counseling: Secondary | ICD-10-CM | POA: Diagnosis not present

## 2020-06-11 DIAGNOSIS — Z9181 History of falling: Secondary | ICD-10-CM | POA: Diagnosis not present

## 2020-06-11 LAB — CBC WITH DIFFERENTIAL/PLATELET
Abs Immature Granulocytes: 0.03 10*3/uL (ref 0.00–0.07)
Basophils Absolute: 0 10*3/uL (ref 0.0–0.1)
Basophils Relative: 0 %
Eosinophils Absolute: 0 10*3/uL (ref 0.0–0.5)
Eosinophils Relative: 0 %
HCT: 40.9 % (ref 39.0–52.0)
Hemoglobin: 13.1 g/dL (ref 13.0–17.0)
Immature Granulocytes: 0 %
Lymphocytes Relative: 9 %
Lymphs Abs: 0.7 10*3/uL (ref 0.7–4.0)
MCH: 28.4 pg (ref 26.0–34.0)
MCHC: 32 g/dL (ref 30.0–36.0)
MCV: 88.5 fL (ref 80.0–100.0)
Monocytes Absolute: 0.9 10*3/uL (ref 0.1–1.0)
Monocytes Relative: 11 %
Neutro Abs: 6.7 10*3/uL (ref 1.7–7.7)
Neutrophils Relative %: 80 %
Platelets: 211 10*3/uL (ref 150–400)
RBC: 4.62 MIL/uL (ref 4.22–5.81)
RDW: 14.6 % (ref 11.5–15.5)
WBC: 8.4 10*3/uL (ref 4.0–10.5)
nRBC: 0 % (ref 0.0–0.2)

## 2020-06-11 LAB — COMPREHENSIVE METABOLIC PANEL
ALT: 26 U/L (ref 0–44)
AST: 27 U/L (ref 15–41)
Albumin: 3.9 g/dL (ref 3.5–5.0)
Alkaline Phosphatase: 42 U/L (ref 38–126)
Anion gap: 7 (ref 5–15)
BUN: 43 mg/dL — ABNORMAL HIGH (ref 8–23)
CO2: 19 mmol/L — ABNORMAL LOW (ref 22–32)
Calcium: 8.5 mg/dL — ABNORMAL LOW (ref 8.9–10.3)
Chloride: 109 mmol/L (ref 98–111)
Creatinine, Ser: 1.49 mg/dL — ABNORMAL HIGH (ref 0.61–1.24)
GFR, Estimated: 48 mL/min — ABNORMAL LOW (ref >60)
Glucose, Bld: 89 mg/dL (ref 70–99)
Potassium: 3.5 mmol/L (ref 3.5–5.1)
Sodium: 135 mmol/L (ref 135–145)
Total Bilirubin: 0.8 mg/dL (ref 0.3–1.2)
Total Protein: 6.7 g/dL (ref 6.5–8.1)

## 2020-06-11 LAB — URINALYSIS, ROUTINE W REFLEX MICROSCOPIC
Bilirubin Urine: NEGATIVE
Glucose, UA: NEGATIVE mg/dL
Ketones, ur: NEGATIVE mg/dL
Leukocytes,Ua: NEGATIVE
Nitrite: NEGATIVE
Protein, ur: 30 mg/dL — AB
Specific Gravity, Urine: 1.026 (ref 1.005–1.030)
pH: 5 (ref 5.0–8.0)

## 2020-06-11 LAB — C-REACTIVE PROTEIN: CRP: 7.5 mg/dL — ABNORMAL HIGH (ref <1.0)

## 2020-06-11 LAB — CBG MONITORING, ED
Glucose-Capillary: 89 mg/dL (ref 70–99)
Glucose-Capillary: 95 mg/dL (ref 70–99)
Glucose-Capillary: 96 mg/dL (ref 70–99)
Glucose-Capillary: 84 mg/dL (ref 70–99)

## 2020-06-11 LAB — CSF CELL COUNT WITH DIFFERENTIAL
RBC Count, CSF: 1 /mm3 — ABNORMAL HIGH
RBC Count, CSF: 22 /mm3 — ABNORMAL HIGH
Tube #: 1
Tube #: 4
WBC, CSF: 1 /mm3 (ref 0–5)
WBC, CSF: 3 /mm3 (ref 0–5)

## 2020-06-11 LAB — RESP PANEL BY RT-PCR (FLU A&B, COVID) ARPGX2
Influenza A by PCR: NEGATIVE
Influenza B by PCR: NEGATIVE
SARS Coronavirus 2 by RT PCR: POSITIVE — AB

## 2020-06-11 LAB — LACTIC ACID, PLASMA
Lactic Acid, Venous: 1.7 mmol/L (ref 0.5–1.9)
Lactic Acid, Venous: 1.8 mmol/L (ref 0.5–1.9)

## 2020-06-11 LAB — PROCALCITONIN: Procalcitonin: <0.1 ng/mL

## 2020-06-11 LAB — D-DIMER, QUANTITATIVE: D-Dimer, Quant: 1.81 ug/mL-FEU — ABNORMAL HIGH (ref 0.00–0.50)

## 2020-06-11 LAB — PROTEIN, CSF: Total  Protein, CSF: 46 mg/dL — ABNORMAL HIGH (ref 15–45)

## 2020-06-11 LAB — GLUCOSE, CAPILLARY
Glucose-Capillary: 74 mg/dL (ref 70–99)
Glucose-Capillary: 86 mg/dL (ref 70–99)

## 2020-06-11 LAB — URINE CULTURE: Culture: NO GROWTH

## 2020-06-11 LAB — HEMOGLOBIN A1C
Hgb A1c MFr Bld: 6.6 % — ABNORMAL HIGH (ref 4.8–5.6)
Mean Plasma Glucose: 142.72 mg/dL

## 2020-06-11 LAB — APTT: aPTT: 35 seconds (ref 24–36)

## 2020-06-11 LAB — PROTIME-INR
INR: 1.1 (ref 0.8–1.2)
Prothrombin Time: 14.2 seconds (ref 11.4–15.2)

## 2020-06-11 LAB — GLUCOSE, CSF: Glucose, CSF: 54 mg/dL (ref 40–70)

## 2020-06-11 MED ORDER — HALOPERIDOL LACTATE 5 MG/ML IJ SOLN
2.0000 mg | Freq: Four times a day (QID) | INTRAMUSCULAR | Status: DC | PRN
  Administered 2020-06-11: 5 mg via INTRAVENOUS
  Filled 2020-06-11 (×2): qty 1

## 2020-06-11 MED ORDER — LORAZEPAM 2 MG/ML IJ SOLN
1.0000 mg | Freq: Once | INTRAMUSCULAR | Status: AC
  Administered 2020-06-11: 1 mg via INTRAVENOUS
  Filled 2020-06-11: qty 1

## 2020-06-11 MED ORDER — SODIUM CHLORIDE 0.9 % IV SOLN
100.0000 mg | Freq: Every day | INTRAVENOUS | Status: AC
  Administered 2020-06-12 – 2020-06-13 (×2): 100 mg via INTRAVENOUS
  Filled 2020-06-11 (×2): qty 20

## 2020-06-11 MED ORDER — LACTATED RINGERS IV BOLUS (SEPSIS)
1000.0000 mL | Freq: Once | INTRAVENOUS | Status: AC
  Administered 2020-06-11: 1000 mL via INTRAVENOUS

## 2020-06-11 MED ORDER — LACTATED RINGERS IV SOLN: INTRAVENOUS | Status: DC

## 2020-06-11 MED ORDER — PANTOPRAZOLE SODIUM 40 MG IV SOLR
40.0000 mg | INTRAVENOUS | Status: DC
  Administered 2020-06-11 – 2020-06-13 (×3): 40 mg via INTRAVENOUS
  Filled 2020-06-11 (×3): qty 40

## 2020-06-11 MED ORDER — SODIUM CHLORIDE 0.9 % IV SOLN
200.0000 mg | Freq: Once | INTRAVENOUS | Status: AC
  Administered 2020-06-11: 200 mg via INTRAVENOUS
  Filled 2020-06-11: qty 40

## 2020-06-11 MED ORDER — ONDANSETRON HCL 4 MG PO TABS: 4.0000 mg | ORAL_TABLET | Freq: Four times a day (QID) | ORAL | Status: DC | PRN

## 2020-06-11 MED ORDER — HALOPERIDOL LACTATE 5 MG/ML IJ SOLN
2.0000 mg | Freq: Four times a day (QID) | INTRAMUSCULAR | Status: DC | PRN
Start: 2020-06-11 — End: 2020-06-12

## 2020-06-11 MED ORDER — VANCOMYCIN HCL 1000 MG/200ML IV SOLN
1000.0000 mg | Freq: Once | INTRAVENOUS | Status: DC
  Filled 2020-06-11: qty 200

## 2020-06-11 MED ORDER — SODIUM CHLORIDE 0.9 % IV SOLN
2.0000 g | Freq: Once | INTRAVENOUS | Status: AC
  Administered 2020-06-11: 2 g via INTRAVENOUS
  Filled 2020-06-11: qty 2000

## 2020-06-11 MED ORDER — VANCOMYCIN HCL 1500 MG/300ML IV SOLN
1500.0000 mg | Freq: Once | INTRAVENOUS | Status: AC
  Administered 2020-06-11: 1500 mg via INTRAVENOUS
  Filled 2020-06-11: qty 300

## 2020-06-11 MED ORDER — DIPHENHYDRAMINE HCL 50 MG/ML IJ SOLN
INTRAMUSCULAR | Status: AC
  Administered 2020-06-11: 25 mg via INTRAMUSCULAR
  Filled 2020-06-11: qty 1

## 2020-06-11 MED ORDER — ONDANSETRON HCL 4 MG/2ML IJ SOLN
4.0000 mg | Freq: Four times a day (QID) | INTRAMUSCULAR | Status: DC | PRN
  Administered 2020-06-26: 4 mg via INTRAVENOUS
  Filled 2020-06-11: qty 2

## 2020-06-11 MED ORDER — HALOPERIDOL LACTATE 5 MG/ML IJ SOLN
INTRAMUSCULAR | Status: AC
  Administered 2020-06-11: 5 mg via INTRAMUSCULAR
  Filled 2020-06-11: qty 1

## 2020-06-11 MED ORDER — QUETIAPINE FUMARATE 25 MG PO TABS
25.0000 mg | ORAL_TABLET | Freq: Two times a day (BID) | ORAL | Status: DC
  Administered 2020-06-11 – 2020-06-12 (×4): 25 mg via ORAL
  Filled 2020-06-11 (×6): qty 1

## 2020-06-11 MED ORDER — LORAZEPAM 1 MG PO TABS
2.0000 mg | ORAL_TABLET | Freq: Once | ORAL | Status: AC
  Administered 2020-06-11: 2 mg via ORAL
  Filled 2020-06-11: qty 2

## 2020-06-11 MED ORDER — ACETAMINOPHEN 650 MG RE SUPP
650.0000 mg | Freq: Once | RECTAL | Status: AC
  Administered 2020-06-11: 650 mg via RECTAL
  Filled 2020-06-11: qty 1

## 2020-06-11 MED ORDER — INSULIN ASPART 100 UNIT/ML ~~LOC~~ SOLN: 0.0000 [IU] | SUBCUTANEOUS | Status: DC

## 2020-06-11 MED ORDER — LIDOCAINE HCL (PF) 1 % IJ SOLN
INTRAMUSCULAR | Status: AC
  Administered 2020-06-11: 30 mL
  Filled 2020-06-11: qty 30

## 2020-06-11 MED ORDER — ACETAMINOPHEN 325 MG PO TABS
650.0000 mg | ORAL_TABLET | Freq: Four times a day (QID) | ORAL | Status: DC | PRN
  Administered 2020-06-12 – 2020-07-13 (×24): 650 mg via ORAL
  Filled 2020-06-11 (×24): qty 2

## 2020-06-11 MED ORDER — LIDOCAINE HCL (PF) 1 % IJ SOLN: 30.0000 mL | Freq: Once | INTRAMUSCULAR | Status: AC

## 2020-06-11 MED ORDER — DIPHENHYDRAMINE HCL 50 MG/ML IJ SOLN: 25.0000 mg | Freq: Once | INTRAMUSCULAR | Status: AC

## 2020-06-11 MED ORDER — ENOXAPARIN SODIUM 40 MG/0.4ML ~~LOC~~ SOLN
40.0000 mg | SUBCUTANEOUS | Status: DC
  Administered 2020-06-11 – 2020-07-14 (×34): 40 mg via SUBCUTANEOUS
  Filled 2020-06-11 (×35): qty 0.4

## 2020-06-11 MED ORDER — SODIUM CHLORIDE 0.9 % IV SOLN
2.0000 g | Freq: Once | INTRAVENOUS | Status: AC
  Administered 2020-06-11: 2 g via INTRAVENOUS
  Filled 2020-06-11: qty 20

## 2020-06-11 MED ORDER — HALOPERIDOL LACTATE 5 MG/ML IJ SOLN: 5.0000 mg | Freq: Once | INTRAMUSCULAR | Status: AC

## 2020-06-11 NOTE — BH Assessment (Signed)
Clinician contacted Manson Allan, RN.  She said that patient has been given benedryl, Ativan and Haldol to assist with calming patient.  He had been agitated earler.  Patrick Phillips said that patient would be difficult to assess by himself because of his dementia.  TTS to assess when patient is alert.

## 2020-06-11 NOTE — ED Notes (Signed)
Pt in room allowed RN to place IV. Pt will not get into bed. Unable to place cardiac monitor onto pt. Or check rectal temp. Pt sitting in chair at this time. Pt is confused and combative at times.

## 2020-06-11 NOTE — ED Provider Notes (Signed)
Patient with fever of unclear etiology.  Patient has altered mental status. No obvious pneumonia on x-ray, urinalysis was negative.  Due to abrupt change in mental status with fever, advised patient undergo lumbar puncture to evaluate for meningitis/encephalitis.  Patient unable to verbally consent.  Discussed with his daughter [name] via the phone We discussed the risk and benefits of this procedure.  She agrees to provide emergency consent for lumbar puncture Patient is not on anticoagulation.  His coags are normal.  He has not been thrombocytopenic previously. Patient tolerated lumbar puncture very well.  He has already been receiving antibiotics   .Lumbar Puncture  Date/Time: 06/11/2020 4:00 AM Performed by: Ripley Fraise, MD Authorized by: Ripley Fraise, MD   Consent:    Consent obtained:  Written   Consent given by: Daughter.   Risks discussed:  Bleeding, infection, pain and headache   Alternatives discussed:  No treatment Universal protocol:    Patient identity confirmed:  Provided demographic data Pre-procedure details:    Procedure purpose:  Diagnostic   Preparation: Patient was prepped and draped in usual sterile fashion   Anesthesia:    Anesthesia method:  Local infiltration   Local anesthetic:  Lidocaine 1% w/o epi Procedure details:    Lumbar space:  L4-L5 interspace   Needle gauge:  18   Number of attempts:  1   Fluid appearance:  Clear   Tubes of fluid:  4   Total volume (ml):  10 Post-procedure details:    Puncture site:  Adhesive bandage applied and direct pressure applied   Procedure completion:  Tolerated well, no immediate complications      Ripley Fraise, MD 06/11/20 0407

## 2020-06-11 NOTE — H&P (Addendum)
History and Physical    Patrick Phillips LXB:[id number] DOB: 03/09/1940 DOA: 06/10/2020  PCP: [name], MD  Patient coming from: Home  I have personally briefly reviewed patient's old medical records in [place]  Chief Complaint: AMS  HPI: Patrick Phillips is a 80 y.o. male with medical history significant of vascular dementia, DM2, HTN.  At baseline he normally is still very functional: walks his dog every morning and lives at house.  This morning however, he went out for his walk, his dog appeared back at house but patient was missing.  Ultimately a state trooper found patient lying at the roadside.  Pt was confused, though no evidence of any immediate trauma.  Pt brought to [facility] ED initially before being discharged home.  After discharge home he continued to be confused and so was brought in to ED at [facility].  Pt with severe AMS, unable to provide any history, thinks he is still walking his dog, ROS therefore unable to be completed.   ED Course: In the ED pt is very confused and even agitated at times: sometimes thinking he is still walking his dog, and other times hitting, kicking, and urinating on self and RN staff.  Pt now sleeping after 7mg  haldol, 25mg  benadryl, and 3mg  ativan.  Extensive ED work up thus far is remarkable mostly for his COVID-19 testing coming back positive.  Creat 1.49.  UA neg, CXR neg.  WBC nl  Tm 102.1, HR 105.  Initially put on sepsis pathway before COVID testing resulted: Got 3L IVF bolus, got empiric rocephin, ampicillin, vanc.  LP has been performed and results are pending on this.  MRI brain yesterday at [facility] was neg for acute finding.  Lactates nl x2.  Satting 99% on RA.   Review of Systems: Unable to perform due to severe AMS, agitated delirium, etc.  Past Medical History:  Diagnosis Date  . Cancer ([place])    skin cancer  . Dementia ([place])   . Diabetes mellitus without complication ([place])   . Hypertension   . Memory deficit      Past Surgical History:  Procedure Laterality Date  . BACK SURGERY    . HERNIA REPAIR    . LOOP RECORDER INSERTION N/A 07/26/2017   Procedure: LOOP RECORDER INSERTION;  Surgeon: Evans Lance, MD;  Location: [place] [facility];  Service: Cardiovascular;  Laterality: N/A;  . TEE WITHOUT CARDIOVERSION N/A 07/26/2017   Procedure: TRANSESOPHAGEAL ECHOCARDIOGRAM (TEE);  Surgeon: Fay Records, MD;  Location: [facility] ENDOSCOPY;  Service: Cardiovascular;  Laterality: N/A;  . TRIGGER FINGER RELEASE Right 12/02/2015   Procedure: RIGHT THUMB RELEASE TRIGGER FINGER/A-1 PULLEY;  Surgeon: Leanora Cover, MD;  Location: [facility];  Service: Orthopedics;  Laterality: Right;     reports that he has never smoked. He has never used smokeless tobacco. He reports current alcohol use. He reports that he does not use drugs.  Allergies  Allergen Reactions  . Aricept [Donepezil]     Per daughter, increased mood swings and angry    Family History  Problem Relation Age of Onset  . Cancer Mother   . Cancer Father   . Cancer Brother   . Cancer Other      Prior to Admission medications   Medication Sig Start Date End Date Taking? Authorizing Provider  amLODipine (NORVASC) 5 MG tablet Take 5 mg by mouth 2 (two) times daily. 05/30/17  Yes [provider]  Artificial Tear Ointment (DRY EYES OP) Apply  1 drop to eye daily as needed (dry eyes).   Yes [provider]  aspirin EC 81 MG tablet Take 81 mg by mouth daily.   Yes [provider]  atorvastatin (LIPITOR) 40 MG tablet Take 40 mg by mouth daily.   Yes [provider]  escitalopram (LEXAPRO) 20 MG tablet Take 20 mg by mouth daily. 04/30/18  Yes [provider]  esomeprazole (NEXIUM) 40 MG capsule Take 40 mg by mouth daily. 05/25/20  Yes [provider]  glipiZIDE (GLUCOTROL) 5 MG tablet Take 5 mg by mouth daily. 07/11/17  Yes [provider]  meloxicam (MOBIC) 15 MG tablet Take 15 mg by  mouth daily.   Yes [provider]  memantine (NAMENDA) 10 MG tablet Take 1 tablet (10 mg total) by mouth 2 (two) times daily. 10/16/19  Yes McCue, Janett Billow, NP  metFORMIN (GLUCOPHAGE) 1000 MG tablet Take 1,000 mg by mouth 2 (two) times daily.  07/31/13  Yes [provider]  atorvastatin (LIPITOR) 20 MG tablet Take 3 tablets (60 mg total) by mouth daily for 30 days. Patient not taking: Reported on 06/10/2020 07/29/18 08/28/18  Garvin Fila, MD    Physical Exam: Vitals:   06/11/20 0315 06/11/20 0330 06/11/20 0332 06/11/20 0445  BP: (!) 156/70 (!) 159/66  (!) 146/71  Pulse: 88 85  77  Resp: (!) 25 (!) 22  19  Temp:   98.8 F (37.1 C)   TempSrc:   Rectal   SpO2: 95% 98%  97%  Weight:      Height:        Constitutional: Sedated at this time. Eyes: PERRL, lids and conjunctivae normal ENMT: Mucous membranes are moist. Posterior pharynx clear of any exudate or lesions.Normal dentition.  Neck: normal, supple, no masses, no thyromegaly Respiratory: clear to auscultation bilaterally, no wheezing, no crackles. Normal respiratory effort. No accessory muscle use.  Cardiovascular: Regular rate and rhythm, no murmurs / rubs / gallops. No extremity edema. 2+ pedal pulses. No carotid bruits.  Abdomen: no tenderness, no masses palpated. No hepatosplenomegaly. Bowel sounds positive.  Musculoskeletal: no clubbing / cyanosis. No joint deformity upper and lower extremities. Good ROM, no contractures. Normal muscle tone.  Skin: no rashes, lesions, ulcers. No induration Neurologic: MAE, grossly non-focal Psychiatric: Sedated   Labs on Admission: I have personally reviewed following labs and imaging studies  CBC: Recent Labs  Lab 06/10/20 1026 06/11/20 0100  WBC  --  8.4  NEUTROABS  --  6.7  HGB 15.3 13.1  HCT 45.0 40.9  MCV  --  88.5  PLT  --  127   Basic Metabolic Panel: Recent Labs  Lab 06/10/20 1026 06/10/20 1055 06/11/20 0100  NA 140 136 135  K 4.5 4.4 3.5  CL 109 107  109  CO2  --  16* 19*  GLUCOSE 287* 297* 89  BUN 47* 50* 43*  CREATININE 1.50* 1.53* 1.49*  CALCIUM  --  8.7* 8.5*   GFR: Estimated Creatinine Clearance: 44.8 mL/min (A) (by C-G formula based on SCr of 1.49 mg/dL (H)). Liver Function Tests: Recent Labs  Lab 06/10/20 1055 06/11/20 0100  AST 29 27  ALT 30 26  ALKPHOS 51 42  BILITOT 0.6 0.8  PROT 7.2 6.7  ALBUMIN 4.1 3.9   Recent Labs  Lab 06/10/20 1055  LIPASE 39   No results for input(s): AMMONIA in the last 168 hours. Coagulation Profile: Recent Labs  Lab 06/11/20 0100  INR 1.1   Cardiac Enzymes:  Recent Labs  Lab 06/10/20 1055  CKTOTAL 123   BNP (last 3 results) No results for input(s): PROBNP in the last 8760 hours. HbA1C: No results for input(s): HGBA1C in the last 72 hours. CBG: Recent Labs  Lab 06/10/20 1020 06/10/20 1544  GLUCAP 283* 118*   Lipid Profile: No results for input(s): CHOL, HDL, LDLCALC, TRIG, CHOLHDL, LDLDIRECT in the last 72 hours. Thyroid Function Tests: No results for input(s): TSH, T4TOTAL, FREET4, T3FREE, THYROIDAB in the last 72 hours. Anemia Panel: No results for input(s): VITAMINB12, FOLATE, FERRITIN, TIBC, IRON, RETICCTPCT in the last 72 hours. Urine analysis:    Component Value Date/Time   COLORURINE YELLOW 06/11/2020 0017   APPEARANCEUR HAZY (A) 06/11/2020 0017   LABSPEC 1.026 06/11/2020 0017   PHURINE 5.0 06/11/2020 0017   GLUCOSEU NEGATIVE 06/11/2020 0017   HGBUR SMALL (A) 06/11/2020 0017   BILIRUBINUR NEGATIVE 06/11/2020 0017   KETONESUR NEGATIVE 06/11/2020 0017   PROTEINUR 30 (A) 06/11/2020 0017   NITRITE NEGATIVE 06/11/2020 0017   LEUKOCYTESUR NEGATIVE 06/11/2020 0017    Radiological Exams on Admission: CT Head Wo Contrast  Result Date: 06/10/2020 CLINICAL DATA:  Head and C-spine injury, fall, altered mental status EXAM: CT HEAD WITHOUT CONTRAST CT CERVICAL SPINE WITHOUT CONTRAST TECHNIQUE: Multidetector CT imaging of the head and cervical spine was performed  following the standard protocol without intravenous contrast. Multiplanar CT image reconstructions of the cervical spine were also generated. COMPARISON:  05/04/2020 FINDINGS: CT HEAD FINDINGS Brain: Stable mild brain atrophy pattern. No acute intracranial hemorrhage, new mass lesion, acute infarction, midline shift, herniation, hydrocephalus, or extra-axial fluid collection. No focal mass effect or edema. Cisterns are patent. Cerebellar atrophy as well. Vascular: Intracranial atherosclerosis at the skull base. No hyperdense vessel. Skull: Normal. Negative for fracture or focal lesion. Sinuses/Orbits: Minor scattered sinus mucosal thickening as before. Orbits are symmetric. No acute finding. Other: None. CT CERVICAL SPINE FINDINGS Alignment: Normal. Skull base and vertebrae: No acute fracture. No primary bone lesion or focal pathologic process. Soft tissues and spinal canal: Normal prevertebral soft tissues. No hemorrhage or hematoma appreciated. Carotid atherosclerosis. Disc levels: Similar mild multilevel degenerative disc disease and facet arthropathy. Facets are aligned. No subluxation or dislocation. Preserved vertebral body heights. Upper chest: Negative. Other: None. IMPRESSION: Stable brain atrophy pattern. No acute intracranial abnormality or interval change by noncontrast CT. Stable mild cervical degenerative changes as above. No acute osseous finding or malalignment by CT. Electronically Signed   By: Jerilynn Mages.  Shick M.D.   On: 06/10/2020 12:31   CT Cervical Spine Wo Contrast  Result Date: 06/10/2020 CLINICAL DATA:  Head and C-spine injury, fall, altered mental status EXAM: CT HEAD WITHOUT CONTRAST CT CERVICAL SPINE WITHOUT CONTRAST TECHNIQUE: Multidetector CT imaging of the head and cervical spine was performed following the standard protocol without intravenous contrast. Multiplanar CT image reconstructions of the cervical spine were also generated. COMPARISON:  05/04/2020 FINDINGS: CT HEAD FINDINGS Brain:  Stable mild brain atrophy pattern. No acute intracranial hemorrhage, new mass lesion, acute infarction, midline shift, herniation, hydrocephalus, or extra-axial fluid collection. No focal mass effect or edema. Cisterns are patent. Cerebellar atrophy as well. Vascular: Intracranial atherosclerosis at the skull base. No hyperdense vessel. Skull: Normal. Negative for fracture or focal lesion. Sinuses/Orbits: Minor scattered sinus mucosal thickening as before. Orbits are symmetric. No acute finding. Other: None. CT CERVICAL SPINE FINDINGS Alignment: Normal. Skull base and vertebrae: No acute fracture. No primary bone lesion or focal pathologic process. Soft tissues and spinal canal: Normal prevertebral soft  tissues. No hemorrhage or hematoma appreciated. Carotid atherosclerosis. Disc levels: Similar mild multilevel degenerative disc disease and facet arthropathy. Facets are aligned. No subluxation or dislocation. Preserved vertebral body heights. Upper chest: Negative. Other: None. IMPRESSION: Stable brain atrophy pattern. No acute intracranial abnormality or interval change by noncontrast CT. Stable mild cervical degenerative changes as above. No acute osseous finding or malalignment by CT. Electronically Signed   By: Jerilynn Mages.  Shick M.D.   On: 06/10/2020 12:31   MR Brain Wo Contrast (neuro protocol)  Result Date: 06/10/2020 CLINICAL DATA:  Acute neuro deficit.  Mental status change. EXAM: MRI HEAD WITHOUT CONTRAST TECHNIQUE: Multiplanar, multiecho pulse sequences of the brain and surrounding structures were obtained without intravenous contrast. COMPARISON:  CT head 06/10/2020 FINDINGS: Brain: Ventricle size and cerebral volume normal for age. Negative for acute infarct. No significant chronic ischemic change. Negative for hemorrhage or mass. Image quality degraded by mild motion. Vascular: Partial loss of flow void in the left internal carotid artery through the skull base and the distal left vertebral artery which may  be due to slow flow or stenosis. Skull and upper cervical spine: Negative Sinuses/Orbits: Mild mucosal edema paranasal sinuses. No orbital mass. Bilateral exophthalmos. Other: None IMPRESSION: Negative for acute infarct. Partial loss of flow void in the left internal carotid artery distal left vertebral artery which could be due to proximal stenosis. Consider CT angio head and neck for further evaluation. Electronically Signed   By: Franchot Gallo M.D.   On: 06/10/2020 15:09   DG Chest Port 1 View  Result Date: 06/11/2020 CLINICAL DATA:  Questionable sepsis. EXAM: PORTABLE CHEST 1 VIEW COMPARISON:  June 10, 2020 FINDINGS: There is no evidence of acute infiltrate, pleural effusion or pneumothorax. The heart size and mediastinal contours are within normal limits. A radiopaque loop recorder device is seen. The visualized skeletal structures are unremarkable. IMPRESSION: No active disease. Electronically Signed   By: Virgina Norfolk M.D.   On: 06/11/2020 03:10   DG Chest Port 1 View  Result Date: 06/10/2020 CLINICAL DATA:  Altered mental status. EXAM: PORTABLE CHEST 1 VIEW COMPARISON:  May 25, 2020 FINDINGS: Loop recorder in place. Cardiomediastinal silhouette is normal. Mediastinal contours appear intact. Tortuosity and calcific atherosclerotic disease of the aorta. There is no evidence of focal airspace consolidation, pleural effusion or pneumothorax. Osseous structures are without acute abnormality. Soft tissues are grossly normal. IMPRESSION: No active disease. Electronically Signed   By: Fidela Salisbury M.D.   On: 06/10/2020 11:15   DG Hips Bilat W or Wo Pelvis 3-4 Views  Result Date: 06/10/2020 CLINICAL DATA:  Found on side of road, last seen normal at 2130 hours last night, fall, dementia, tenderness of hips bilaterally EXAM: DG HIP (WITH OR WITHOUT PELVIS) 3-4V BILAT COMPARISON:  None FINDINGS: Osseous demineralization. Hip and SI joint spaces preserved. No acute fracture, dislocation, or  bone destruction. Mild degenerative disc disease changes at visualized lower lumbar spine. IMPRESSION: No acute osseous abnormalities. Electronically Signed   By: Lavonia Dana M.D.   On: 06/10/2020 11:48    EKG: Independently reviewed.  Assessment/Plan Principal Problem:   Acute metabolic encephalopathy Active Problems:   Hypertension   Diabetes mellitus without complication ([employer])   HWEXH-37 virus infection   Vascular dementia ([place])    1. Agitated Delirium - 1. Appears to be due to fever and COVID-19 at this time. 2. LP (obtained before COVID resulted) is still pending, but otherwise remainder of other work up has been negative thus far 3. Thankfully  minimal pulmonary involvement at the moment (satting 99% on RA) 4. PRN halidol ordered for the moment 1. Tele monitor 2. Add Ativan if needed 2. COVID-19 1. COVID pathway 2. Start remdesivir 3. No indication for decadron nor baricitinib at this time (no O2 requirement). 4. Tele monitor 5. Cont pulse ox 6. Daily labs 7. Procalcitonin, CRP, and D.Dimer are pending 8. Holding off on further IVF for the moment (got 3L bolus in ED), but if unable to take POs may need to start maint IVF later today. 3. Vascular dementia - 1. Chronic 2. MRI yesterday without acute stroke 3. Resume home meds when able to take POs 4. Mild AKI - 1. Creat slightly increased compared to end of march (was 1.1 at OSH then). 2. IVF as above 3. Trend with daily labs. 5. DM2 - 1. Sensitive SSI Q4H for the moment 6. HTN - 1. Resume home meds when able to take POs 2. Use PRN IV if needed in the meantime  DVT prophylaxis: Lovenox Code Status: Full Family Communication: No family in room, Called and left voice mail with daughter Vladimir Creeks.  (EDP had spoken to daughter earlier in evening to get consent for LP). Disposition Plan: TBD, delirium needs to improve first Consults called: None Admission status: Admit to inpatient  Severity of Illness: The  appropriate patient status for this patient is INPATIENT. Inpatient status is judged to be reasonable and necessary in order to provide the required intensity of service to ensure the patient's safety. The patient's presenting symptoms, physical exam findings, and initial radiographic and laboratory data in the context of their chronic comorbidities is felt to place them at high risk for further clinical deterioration. Furthermore, it is not anticipated that the patient will be medically stable for discharge from the hospital within 2 midnights of admission. The following factors support the patient status of inpatient.   IP status due to SEVERE agitated delirium second to COVID-19 virus infection.  * I certify that at the point of admission it is my clinical judgment that the patient will require inpatient hospital care spanning beyond 2 midnights from the point of admission due to high intensity of service, high risk for further deterioration and high frequency of surveillance required.*    Danesha Kirchoff M. DO [facility]  How to contact the [facility] Attending or Consulting provider [place] or covering provider during after hours [place], for this patient?  1. Check the care team in [facility] and look for a) attending/consulting [facility] provider listed and b) the [facility] team listed 2. Log into [email or web address]  Amion Physician Scheduling and messaging for groups and whole hospitals  On call and physician scheduling software for group practices, residents, hospitalists and other medical providers for call, clinic, rotation and shift schedules. OnCall Enterprise is a hospital-wide system for scheduling doctors and paging doctors on call. EasyPlot is for scientific plotting and data analysis.  [email or web address]  and use [place]'s universal password to access. If you do not have the password, please contact the hospital operator.  3. Locate the [facility] provider you are looking for under [facility] and page to  a number that you can be directly reached. 4. If you still have difficulty reaching the provider, please page the [facility] (Director on Call) for the Hospitalists listed on amion for assistance.  06/11/2020, 5:43 AM

## 2020-06-11 NOTE — ED Notes (Signed)
Pt becoming violent towards staff. Pt scratched Animal nutritionist. Requested order for soft wrist restraints. Rectal temp 102.1. Pt placed in 4 point soft restraints

## 2020-06-11 NOTE — H&P (Incomplete)
Behavioral Health Medical Screening Exam  Patrick Phillips is a 80 y.o. male with a past psyc  Total Time spent with patient: 20 minutes  Psychiatric Specialty Exam:  Presentation  General Appearance: Appropriate for Environment; Casual  Eye Contact:Fair  Speech:Garbled (History was provided by the patient's daughter)  Speech Volume:Normal  Handedness:Right   Mood and Affect  Mood:Irritable  Affect:Blunt   Thought Process  Thought Processes:Disorganized  Descriptions of Associations:Loose  Orientation:Partial  Thought Content:Scattered  History of Schizophrenia/Schizoaffective disorder:No data recorded Duration of Psychotic Symptoms:No data recorded Hallucinations:Hallucinations: Visual (Per patient's daughter, patient is experiencing visual hallucinations.) Description of Visual Hallucinations: Per patient's daughter, patient made statements about dogs coming towards him when first entering [facility]  Ideas of Reference:None  Suicidal Thoughts:Suicidal Thoughts: Yes, Passive SI Passive Intent and/or Plan: Without Intent; Without Plan  Homicidal Thoughts:Homicidal Thoughts: No   Sensorium  Memory:-- (Unable to assess)  Judgment:Impaired  Insight:Lacking   Executive Functions  Concentration:Poor  Attention Span:Poor  [place]   Psychomotor Activity  Psychomotor Activity:Psychomotor Activity: Restlessness   Assets  Assets:Desire for Improvement   Sleep  Sleep:Sleep: Fair    Physical Exam: Physical Exam Constitutional:      Appearance: Normal appearance.  HENT:     Head: Normocephalic and atraumatic.     Nose: Nose normal.  Cardiovascular:     Rate and Rhythm: Normal rate.  Pulmonary:     Effort: Pulmonary effort is normal.  Musculoskeletal:        General: Normal range of motion.     Cervical back: Normal range of motion and neck supple.  Neurological:     Mental Status: He is disoriented.      Gait: Gait abnormal.  Psychiatric:        Attention and Perception: He is inattentive. He perceives visual hallucinations. He does not perceive auditory hallucinations.        Mood and Affect: Affect is blunt.        Speech: He is noncommunicative.        Behavior: Behavior is agitated. Behavior is cooperative.        Thought Content: Thought content includes suicidal ideation. Thought content does not include homicidal ideation. Thought content does not include suicidal plan.        Cognition and Memory: Cognition is impaired. He exhibits impaired recent memory.        Judgment: Judgment is inappropriate.    Review of Systems  Constitutional: Negative.   HENT: Negative.   Eyes: Negative.   Respiratory: Negative.   Cardiovascular: Negative.   Gastrointestinal: Negative.   Genitourinary: Negative.   Musculoskeletal: Negative.   Skin: Negative.   Neurological: Negative.   Psychiatric/Behavioral: Positive for hallucinations and suicidal ideas. Negative for depression and substance abuse. The patient is not nervous/anxious and does not have insomnia.    Blood pressure (!) 169/73, pulse 70, temperature 98.8 F (37.1 C), temperature source Oral, resp. rate 20, SpO2 100 %. There is no height or weight on file to calculate BMI.  Musculoskeletal: Strength & Muscle Tone: {desc; muscle tone:32375} Gait & Station: {PE GAIT ED WEXH:37169} Patient leans: {Patient Leans:21022755}   Recommendations:  {[facility] MSE Recommendations:304701}  Malachy Mood, PA 06/11/2020, 2:10 AM

## 2020-06-11 NOTE — H&P (Signed)
Behavioral Health Medical Screening Exam  Patrick Phillips is a 80 y.o. male with a past psychiatric history significant for depression who presents to [facility], accompanied by his daughter, due to worsening confusion and altered mental status.  Per patient's daughter, she received a call from her brother stating that the patient was found on the side of the road.  According to the brother, the patient had clothes on but did not have on a proper attire such as using a sock for a belt.  Once patient was taken home, patient became increasingly agitated and more difficult to manage.  Patient was reported taking his clothes off and having difficulty getting into bed.  As the day continued, patient was reported having unsteady gait and was uncommunicative.  Per patient's daughter, her brother reported that the patient had an episode of diarrhea while at home with what looked like traces of blood noted in the diarrhea.  Patient was taken to [facility] for the assessment of his confusion and altered mental status.  Patient had the following labs performed: CMP, lipase, CK, troponin I, blood cultures, lactic acid, urinalysis, and urine culture.  Lab results were mostly negative with the exception of CBG and lipase being slightly elevated.  The following images were also performed: X-ray of the chest and hips, CT of the head and cervical spine, and MRI of the brain.  Images were without any acute abnormalities.  During his hospital stay, patient was given IV fluids and some IV insulin.  A CT scan of the abdomen was to be performed due to reports of patient experiencing diarrhea.  Patient's abdomen was nontender on exam and CT of the abdomen was not done due to the patient becoming uncooperative.  After patient was discharged from [facility], patient continued to be increasingly combative.  Patient's daughter reports that the patient was difficult to contain.  She reports that she and her siblings  had a very difficult time with trying to put patient back into the car.  Patient's daughter states that they considered trying to IVC the patient at [facility] but were not able to do so.  An ambulance was called in hopes of taking the patient over to [facility] for further assessment, however, patient's daughter states that the EMS personnel informed her to go to [facility] for assessment.  Patient's daughter states that the patient has expressed thoughts of wanting to harm himself.  When patient was asked if he wanted to harm himself patient expressed that he did and stated, "I will mind my own business."  Patient denies homicidal ideations and auditory or visual hallucinations.  Per patient's daughter, she expresses that the patient would make vague statements about seeing things around him.  She reports that when he entered [facility], he stated that "all the dogs were coming right on him."  Patient's daughter states that he receives fair sleep and goes to bed at 8:30 PM and wakes up around 6 or 7 AM.  She reports that the patient has eaten a chicken biscuit today.  She denies that the patient uses tobacco products or illicit drug use.  Patient's daughter expresses that the patient drinks alcohol sparingly.  According to the patient's daughter, patient's behavior is markedly different from the previous day.  She reports that the patient helped gather plywood for her house the previous day.  Total Time spent with patient: 20 minutes  Psychiatric Specialty Exam:  Presentation  General Appearance: Appropriate for Environment; Casual  Eye Contact:Fair  Speech:Garbled (History was provided by the patient's daughter)  Speech Volume:Normal  Handedness:Right   Mood and Affect  Mood:Irritable  Affect:Blunt   Thought Process  Thought Processes:Disorganized  Descriptions of Associations:Loose  Orientation:Partial  Thought Content:Scattered  History of Schizophrenia/Schizoaffective  disorder:No data recorded Duration of Psychotic Symptoms:No data recorded Hallucinations:Hallucinations: Visual (Per patient's daughter, patient is experiencing visual hallucinations.) Description of Visual Hallucinations: Per patient's daughter, patient made statements about dogs coming towards him when first entering [facility]  Ideas of Reference:None  Suicidal Thoughts:Suicidal Thoughts: Yes, Passive SI Passive Intent and/or Plan: Without Intent; Without Plan  Homicidal Thoughts:Homicidal Thoughts: No   Sensorium  Memory:-- (Unable to assess)  Judgment:Impaired  Insight:Lacking   Executive Functions  Concentration:Poor  Attention Span:Poor  Wright   Psychomotor Activity  Psychomotor Activity:Psychomotor Activity: Restlessness   Assets  Assets:Desire for Improvement   Sleep  Sleep:Sleep: Fair    Physical Exam: Physical Exam Constitutional:      Appearance: Normal appearance.  HENT:     Head: Normocephalic and atraumatic.     Nose: Nose normal.  Cardiovascular:     Rate and Rhythm: Normal rate.  Pulmonary:     Effort: Pulmonary effort is normal.  Musculoskeletal:        General: Normal range of motion.     Cervical back: Normal range of motion and neck supple.  Neurological:     Mental Status: He is disoriented.     Gait: Gait abnormal.  Psychiatric:        Attention and Perception: He is inattentive. He perceives visual hallucinations. He does not perceive auditory hallucinations.        Mood and Affect: Affect is blunt.        Speech: He is noncommunicative.        Behavior: Behavior is agitated. Behavior is cooperative.        Thought Content: Thought content includes suicidal ideation. Thought content does not include homicidal ideation. Thought content does not include suicidal plan.        Cognition and Memory: Cognition is impaired. He exhibits impaired recent memory.        Judgment: Judgment is  inappropriate.    Review of Systems  Constitutional: Negative.   HENT: Negative.   Eyes: Negative.   Respiratory: Negative.   Cardiovascular: Negative.   Gastrointestinal: Negative.   Genitourinary: Negative.   Musculoskeletal: Negative.   Skin: Negative.   Neurological: Negative.   Psychiatric/Behavioral: Positive for hallucinations and suicidal ideas. Negative for depression and substance abuse. The patient is not nervous/anxious and does not have insomnia.    Blood pressure (!) 169/73, pulse 70, temperature 98.8 F (37.1 C), temperature source Oral, resp. rate 20, SpO2 100 %. There is no height or weight on file to calculate BMI.  Musculoskeletal: Strength & Muscle Tone: within normal limits Gait & Station: unsteady Patient leans: N/A   Recommendations:  Based on my evaluation the patient appears to have an emergency medical condition for which I recommend the patient be transferred to the emergency department for further evaluation.  Vitals were obtained prior to assessing the patient.  Patient exhibited an elevated blood pressure of 169/73.  Due to worsening altered mental status and elevated blood pressure, patient is recommended to be transferred over to [facility] ED for further assessment and medical clearance.  Dr. Laverta Baltimore [facility], EDP was contacted and information relayed to him regarding the patient.  Safe transport was arranged  for the patient.  Malachy Mood, PA 06/11/2020, 2:43 AM

## 2020-06-11 NOTE — Progress Notes (Signed)
PROGRESS NOTE                                                                                                                                                                                                             Patient Demographics:    Patrick Phillips, is a 80 y.o. male, DOB - [birth year], KPT:[id number]  Outpatient Primary MD for the patient is [name], MD   Admit date - 06/10/2020   LOS - 0  Chief Complaint  Patient presents with  . Altered Mental Status       Brief Narrative: Patient is a 80 y.o. male with PMHx of vascular dementia, prior CVA, DM-2, HTN-who presented with fever and confusion.  COVID-19 vaccinated status: Unknown  Significant Events: 4/14>> Admit to [facility] for fever/confusion  Significant studies: 4/14>>Chest x-ray: No PNA 4/14>> CT head: No acute intracranial abnormality. 4/14>> CT C-spine: No fracture 4/14>> MRI brain: No acute infarct. 4/15>> chest x-ray: No PNA 4/15>> CSF: WBC 1 (tube #4), protein 46, glucose 54  COVID-19 medications: Remdesivir: 4/15>>  Antibiotics: Vancomycin: 4/14 x1 Ceftriaxone: 4/14 x 1 Ampicillin: 4/14 x 1  Microbiology data: 4/14 >>blood culture: No growth 4/14>> urine culture: Pending 4/15>> blood culture: Pending 4/15>> urine culture: Pending 4/15>> CSF culture: Pending  Procedures: None  Consults: None  DVT prophylaxis: enoxaparin (LOVENOX) injection 40 mg Start: 06/11/20 0600    Subjective:    [employer] today remains confused-wakes up-mumbles incoherently-moving all 4 extremities.  No family at bedside.   Assessment  & Plan :   Acute metabolic encephalopathy: Could be related to febrile illness/COVID-19 infection.  Await cultures-CSF studies not indicative of meningitis.  Neuroimaging as above.  Obtain EEG-start Seroquel-use Haldol as needed.  Suspect will continue to have some amount of delirium and encephalopathy during this  hospital stay-given history of baseline dementia.  No family at bedside-have ordered Air cabin crew.  SIRS-COVID-19 infection: Continue Remdesivir-no pneumonia on imaging-awaiting cultures.  Hold off on further antibiotic therapy.  Recent Labs    06/11/20 0445  DDIMER 1.81*  CRP 7.5*    Lab Results  Component Value Date   SARSCOV2NAA POSITIVE (A) 06/11/2020   [place] NEGATIVE 01/13/2020    AKI: Mild-likely hemodynamically mediated-continue IV fluids and supportive care.  HTN: BP on the higher side-we will use as needed medications well patient is confused-once mental  status improves-resume amlodipine.  DM-2: CBGs relatively stable-continue SSI-oral hypoglycemic agents on hold  Recent Labs    06/10/20 1544 06/11/20 0601 06/11/20 0808  GLUCAP 118* 89 95   History of CVA-MRI brain negative for infarct-resume aspirin/statin when mental status/oral intake permits.  Dementia: Resume Namenda when mental status permits/oral intake resumes.  GERD: On PPI   GI prophylaxis: PPI  ABG:    Component Value Date/Time   TCO2 18 (L) 06/10/2020 1026    Vent Settings: N/A  Condition - Extremely Guarded  Family Communication  :  Daughter-[phone] updated over the phone 4/15  Code Status :  Full Code  Diet :  Diet Order            Diet NPO time specified  Diet effective now                  Disposition Plan  :   Status is: Inpatient  Remains inpatient appropriate because:Inpatient level of care appropriate due to severity of illness   Dispo: The patient is from: Home              Anticipated d/c is to: Home              Patient currently is not medically stable to d/c.   Difficult to place patient No   Barriers to discharge: Fever/Confusion-work up in progress  Antimicorbials  :    Anti-infectives (From admission, onward)   Start     Dose/Rate Route Frequency Ordered Stop   06/12/20 1000  remdesivir 100 mg in sodium chloride 0.9 % 100 mL IVPB         100 mg 200 mL/hr over 30 Minutes Intravenous Daily 06/11/20 0435 06/16/20 0959   06/11/20 0445  remdesivir 200 mg in sodium chloride 0.9% 250 mL IVPB        200 mg 580 mL/hr over 30 Minutes Intravenous Once 06/11/20 0434 06/11/20 0851   06/11/20 0230  vancomycin (VANCOREADY) IVPB 1500 mg/300 mL        1,500 mg 150 mL/hr over 120 Minutes Intravenous  Once 06/11/20 0229 06/11/20 0812   06/11/20 0215  ampicillin (OMNIPEN) 2 g in sodium chloride 0.9 % 100 mL IVPB        2 g 300 mL/hr over 20 Minutes Intravenous  Once 06/11/20 0157 06/11/20 0550   06/11/20 0215  vancomycin (VANCOREADY) IVPB 1000 mg/200 mL  Status:  Discontinued        1,000 mg 200 mL/hr over 60 Minutes Intravenous  Once 06/11/20 0157 06/11/20 0229   06/11/20 0200  cefTRIAXone (ROCEPHIN) 2 g in sodium chloride 0.9 % 100 mL IVPB        2 g 200 mL/hr over 30 Minutes Intravenous  Once 06/11/20 0157 06/11/20 0248      Inpatient Medications  Scheduled Meds: . enoxaparin (LOVENOX) injection  40 mg Subcutaneous Q24H  . insulin aspart  0-9 Units Subcutaneous Q4H  . QUEtiapine  25 mg Oral BID   Continuous Infusions: . [START ON 06/12/2020] remdesivir 100 mg in NS 100 mL     PRN Meds:.acetaminophen, haloperidol lactate, ondansetron **OR** ondansetron (ZOFRAN) IV   Time Spent in minutes  35    See all Orders from today for further details   Oren Binet M.D on 06/11/2020 at 9:17 AM  To page go to [email or web address] - use universal password  [facility] -  Office  [phone]    Objective:   Vitals:   06/11/20 3235 06/11/20 0445  06/11/20 0715 06/11/20 0811  BP:  (!) 146/71 131/69 (!) 155/65  Pulse:  77 67 75  Resp:  19 19 19   Temp: 98.8 F (37.1 C)   97.9 F (36.6 C)  TempSrc: Rectal   Axillary  SpO2:  97% 96% 100%  Weight:      Height:        Wt Readings from Last 3 Encounters:  06/10/20 86.2 kg  06/10/20 86.2 kg  04/19/20 86.2 kg     Intake/Output Summary (Last 24 hours) at 06/11/2020  0917 Last data filed at 06/11/2020 6578 Gross per 24 hour  Intake 3299.91 ml  Output --  Net 3299.91 ml     Physical Exam Gen Exam: Confused-but not in any distress. HEENT:atraumatic, normocephalic Chest: B/L clear to auscultation anteriorly CVS:S1S2 regular Abdomen:soft non tender, non distended Extremities:no edema Neurology: Difficult exam-but seems to be moving all 4 extremities. skin: no rash   Data Review:    CBC Recent Labs  Lab 06/10/20 1026 06/11/20 0100  WBC  --  8.4  HGB 15.3 13.1  HCT 45.0 40.9  PLT  --  211  MCV  --  88.5  MCH  --  28.4  MCHC  --  32.0  RDW  --  14.6  LYMPHSABS  --  0.7  MONOABS  --  0.9  EOSABS  --  0.0  BASOSABS  --  0.0    Chemistries  Recent Labs  Lab 06/10/20 1026 06/10/20 1055 06/11/20 0100  NA 140 136 135  K 4.5 4.4 3.5  CL 109 107 109  CO2  --  16* 19*  GLUCOSE 287* 297* 89  BUN 47* 50* 43*  CREATININE 1.50* 1.53* 1.49*  CALCIUM  --  8.7* 8.5*  AST  --  29 27  ALT  --  30 26  ALKPHOS  --  51 42  BILITOT  --  0.6 0.8   ------------------------------------------------------------------------------------------------------------------ No results for input(s): CHOL, HDL, LDLCALC, TRIG, CHOLHDL, LDLDIRECT in the last 72 hours.  Lab Results  Component Value Date   HGBA1C 6.6 (H) 06/11/2020   ------------------------------------------------------------------------------------------------------------------ No results for input(s): TSH, T4TOTAL, T3FREE, THYROIDAB in the last 72 hours.  Invalid input(s): FREET3 ------------------------------------------------------------------------------------------------------------------ No results for input(s): VITAMINB12, FOLATE, FERRITIN, TIBC, IRON, RETICCTPCT in the last 72 hours.  Coagulation profile Recent Labs  Lab 06/11/20 0100  INR 1.1    Recent Labs    06/11/20 0445  DDIMER 1.81*    Cardiac Enzymes No results for input(s): CKMB, TROPONINI, MYOGLOBIN in the  last 168 hours.  Invalid input(s): CK ------------------------------------------------------------------------------------------------------------------    Component Value Date/Time   BNP 25.0 10/10/2017 2103    Micro Results Recent Results (from the past 240 hour(s))  Culture, blood (Routine X 2) w Reflex to ID Panel     Status: None (Preliminary result)   Collection Time: 06/10/20 11:32 AM   Specimen: BLOOD  Result Value Ref Range Status   Specimen Description BLOOD RIGHT ANTECUBITAL  Final   Special Requests   Final    Blood Culture adequate volume BOTTLES DRAWN AEROBIC AND ANAEROBIC   Culture   Final    NO GROWTH < 24 HOURS Performed at [facility], [address] [place], [place] [address]    Report Status PENDING  Incomplete  Culture, blood (Routine X 2) w Reflex to ID Panel     Status: None (Preliminary result)   Collection Time: 06/10/20 11:33 AM   Specimen: BLOOD  Result Value Ref Range  Status   Specimen Description BLOOD BLOOD RIGHT WRIST  Final   Special Requests   Final    Blood Culture adequate volume BOTTLES DRAWN AEROBIC AND ANAEROBIC   Culture   Final    NO GROWTH < 24 HOURS Performed at [facility], [address] [place], [place] [address]    Report Status PENDING  Incomplete  Resp Panel by RT-PCR (Flu A&B, Covid) Nasopharyngeal Swab     Status: Abnormal   Collection Time: 06/11/20  2:29 AM   Specimen: Nasopharyngeal Swab; Nasopharyngeal(NP) swabs in vial transport medium  Result Value Ref Range Status   SARS Coronavirus 2 by RT PCR POSITIVE (A) NEGATIVE Final    Comment: RESULT CALLED TO, READ BACK BY AND VERIFIED WITH: K MOON RN 06/11/20 0411 JDW (NOTE) SARS-CoV-2 target nucleic acids are DETECTED.  The SARS-CoV-2 RNA is generally detectable in upper respiratory specimens during the acute phase of infection. Positive results are indicative of the presence of the identified virus, but do not rule out bacterial infection or co-infection with other  pathogens not detected by the test. Clinical correlation with patient history and other diagnostic information is necessary to determine patient infection status. The expected result is Negative.  Fact Sheet for Patients: EntrepreneurPulse.com.au  Fact Sheet for Healthcare Providers: IncredibleEmployment.be  This test is not yet approved or cleared by the [place] FDA and  has been authorized for detection and/or diagnosis of SARS-CoV-2 by FDA under an Emergency Use Authorization (EUA).  This EUA will remain in effect (meaning this test can be used)  for the duration of  the COVID-19 declaration under Section 564(b)(1) of the Act, 21 U.S.C. section 360bbb-3(b)(1), unless the authorization is terminated or revoked sooner.     Influenza A by PCR NEGATIVE NEGATIVE Final   Influenza B by PCR NEGATIVE NEGATIVE Final    Comment: (NOTE) The Xpert Xpress SARS-CoV-2/FLU/RSV plus assay is intended as an aid in the diagnosis of influenza from Nasopharyngeal swab specimens and should not be used as a sole basis for treatment. Nasal washings and aspirates are unacceptable for Xpert Xpress SARS-CoV-2/FLU/RSV testing.  Fact Sheet for Patients: EntrepreneurPulse.com.au  Fact Sheet for Healthcare Providers: IncredibleEmployment.be  This test is not yet approved or cleared by the [place] FDA and has been authorized for detection and/or diagnosis of SARS-CoV-2 by FDA under an Emergency Use Authorization (EUA). This EUA will remain in effect (meaning this test can be used) for the duration of the COVID-19 declaration under Section 564(b)(1) of the Act, 21 U.S.C. section 360bbb-3(b)(1), unless the authorization is terminated or revoked.  Performed at [facility], [place] [address], [place], [place] [address]   CSF culture     Status: None (Preliminary result)   Collection Time: 06/11/20  5:20 AM    Specimen: CSF; Cerebrospinal Fluid  Result Value Ref Range Status   Specimen Description CSF  Final   Special Requests NONE  Final   Gram Stain   Final    NO WBC SEEN NO ORGANISMS SEEN CYTOSPIN SMEAR Performed at [facility], [place] [address] [place], [place] [address]    Culture PENDING  Incomplete   Report Status PENDING  Incomplete    Radiology Reports CT Head Wo Contrast  Result Date: 06/10/2020 CLINICAL DATA:  Head and C-spine injury, fall, altered mental status EXAM: CT HEAD WITHOUT CONTRAST CT CERVICAL SPINE WITHOUT CONTRAST TECHNIQUE: Multidetector CT imaging of the head and cervical spine was performed following the standard protocol without  intravenous contrast. Multiplanar CT image reconstructions of the cervical spine were also generated. COMPARISON:  05/04/2020 FINDINGS: CT HEAD FINDINGS Brain: Stable mild brain atrophy pattern. No acute intracranial hemorrhage, new mass lesion, acute infarction, midline shift, herniation, hydrocephalus, or extra-axial fluid collection. No focal mass effect or edema. Cisterns are patent. Cerebellar atrophy as well. Vascular: Intracranial atherosclerosis at the skull base. No hyperdense vessel. Skull: Normal. Negative for fracture or focal lesion. Sinuses/Orbits: Minor scattered sinus mucosal thickening as before. Orbits are symmetric. No acute finding. Other: None. CT CERVICAL SPINE FINDINGS Alignment: Normal. Skull base and vertebrae: No acute fracture. No primary bone lesion or focal pathologic process. Soft tissues and spinal canal: Normal prevertebral soft tissues. No hemorrhage or hematoma appreciated. Carotid atherosclerosis. Disc levels: Similar mild multilevel degenerative disc disease and facet arthropathy. Facets are aligned. No subluxation or dislocation. Preserved vertebral body heights. Upper chest: Negative. Other: None. IMPRESSION: Stable brain atrophy pattern. No acute intracranial abnormality or interval change by noncontrast CT.  Stable mild cervical degenerative changes as above. No acute osseous finding or malalignment by CT. Electronically Signed   By: Jerilynn Mages.  Shick M.D.   On: 06/10/2020 12:31   CT Cervical Spine Wo Contrast  Result Date: 06/10/2020 CLINICAL DATA:  Head and C-spine injury, fall, altered mental status EXAM: CT HEAD WITHOUT CONTRAST CT CERVICAL SPINE WITHOUT CONTRAST TECHNIQUE: Multidetector CT imaging of the head and cervical spine was performed following the standard protocol without intravenous contrast. Multiplanar CT image reconstructions of the cervical spine were also generated. COMPARISON:  05/04/2020 FINDINGS: CT HEAD FINDINGS Brain: Stable mild brain atrophy pattern. No acute intracranial hemorrhage, new mass lesion, acute infarction, midline shift, herniation, hydrocephalus, or extra-axial fluid collection. No focal mass effect or edema. Cisterns are patent. Cerebellar atrophy as well. Vascular: Intracranial atherosclerosis at the skull base. No hyperdense vessel. Skull: Normal. Negative for fracture or focal lesion. Sinuses/Orbits: Minor scattered sinus mucosal thickening as before. Orbits are symmetric. No acute finding. Other: None. CT CERVICAL SPINE FINDINGS Alignment: Normal. Skull base and vertebrae: No acute fracture. No primary bone lesion or focal pathologic process. Soft tissues and spinal canal: Normal prevertebral soft tissues. No hemorrhage or hematoma appreciated. Carotid atherosclerosis. Disc levels: Similar mild multilevel degenerative disc disease and facet arthropathy. Facets are aligned. No subluxation or dislocation. Preserved vertebral body heights. Upper chest: Negative. Other: None. IMPRESSION: Stable brain atrophy pattern. No acute intracranial abnormality or interval change by noncontrast CT. Stable mild cervical degenerative changes as above. No acute osseous finding or malalignment by CT. Electronically Signed   By: Jerilynn Mages.  Shick M.D.   On: 06/10/2020 12:31   MR Brain Wo Contrast (neuro  protocol)  Result Date: 06/10/2020 CLINICAL DATA:  Acute neuro deficit.  Mental status change. EXAM: MRI HEAD WITHOUT CONTRAST TECHNIQUE: Multiplanar, multiecho pulse sequences of the brain and surrounding structures were obtained without intravenous contrast. COMPARISON:  CT head 06/10/2020 FINDINGS: Brain: Ventricle size and cerebral volume normal for age. Negative for acute infarct. No significant chronic ischemic change. Negative for hemorrhage or mass. Image quality degraded by mild motion. Vascular: Partial loss of flow void in the left internal carotid artery through the skull base and the distal left vertebral artery which may be due to slow flow or stenosis. Skull and upper cervical spine: Negative Sinuses/Orbits: Mild mucosal edema paranasal sinuses. No orbital mass. Bilateral exophthalmos. Other: None IMPRESSION: Negative for acute infarct. Partial loss of flow void in the left internal carotid artery distal left vertebral artery which could be due to  proximal stenosis. Consider CT angio head and neck for further evaluation. Electronically Signed   By: Franchot Gallo M.D.   On: 06/10/2020 15:09   DG Chest Port 1 View  Result Date: 06/11/2020 CLINICAL DATA:  Questionable sepsis. EXAM: PORTABLE CHEST 1 VIEW COMPARISON:  June 10, 2020 FINDINGS: There is no evidence of acute infiltrate, pleural effusion or pneumothorax. The heart size and mediastinal contours are within normal limits. A radiopaque loop recorder device is seen. The visualized skeletal structures are unremarkable. IMPRESSION: No active disease. Electronically Signed   By: Virgina Norfolk M.D.   On: 06/11/2020 03:10   DG Chest Port 1 View  Result Date: 06/10/2020 CLINICAL DATA:  Altered mental status. EXAM: PORTABLE CHEST 1 VIEW COMPARISON:  May 25, 2020 FINDINGS: Loop recorder in place. Cardiomediastinal silhouette is normal. Mediastinal contours appear intact. Tortuosity and calcific atherosclerotic disease of the aorta. There  is no evidence of focal airspace consolidation, pleural effusion or pneumothorax. Osseous structures are without acute abnormality. Soft tissues are grossly normal. IMPRESSION: No active disease. Electronically Signed   By: Fidela Salisbury M.D.   On: 06/10/2020 11:15   CUP PACEART REMOTE DEVICE CHECK  Result Date: 05/22/2020 ILR summary report received. Battery status OK. Normal device function. No new symptom, tachy, brady, or pause episodes. No new AF episodes. Monthly summary reports and ROV/PRN.  R. Powers, CVRS  DG Hips Bilat W or Wo Pelvis 3-4 Views  Result Date: 06/10/2020 CLINICAL DATA:  Found on side of road, last seen normal at 2130 hours last night, fall, dementia, tenderness of hips bilaterally EXAM: DG HIP (WITH OR WITHOUT PELVIS) 3-4V BILAT COMPARISON:  None FINDINGS: Osseous demineralization. Hip and SI joint spaces preserved. No acute fracture, dislocation, or bone destruction. Mild degenerative disc disease changes at visualized lower lumbar spine. IMPRESSION: No acute osseous abnormalities. Electronically Signed   By: Lavonia Dana M.D.   On: 06/10/2020 11:48

## 2020-06-11 NOTE — ED Notes (Signed)
Pt kicking and hitting this RN. RN tried to redirect pt. Pt kept trying to get out bed. Pt told multiple times that it was not safe and he needed to lay back down. Meds ordered by Alcario Drought, MD.

## 2020-06-11 NOTE — Progress Notes (Signed)
   06/11/20 1720  Vitals  Temp 98.4 F (36.9 C)  Temp Source Axillary  BP (!) 160/83  MAP (mmHg) 105  BP Location Right Arm  BP Method Automatic  Patient Position (if appropriate) Lying  Pulse Rate 62  Pulse Rate Source Monitor  Resp 20  MEWS COLOR  MEWS Score Color Green  Oxygen Therapy  SpO2 98 %  O2 Device Room Air  Pain Assessment  Pain Scale PAINAD  PAINAD (Pain Assessment in Advanced Dementia)  Breathing 0  Negative Vocalization 0  Facial Expression 0  Body Language 0  Consolability 0  PAINAD Score 0   Admit from the ED.

## 2020-06-11 NOTE — ED Notes (Signed)
Pt up wandering around the halls, trying to go into pt rooms. Pt was pushing staff and not redirectable at this time. Security was called to come assist. Pt being moved to a room at this time.

## 2020-06-11 NOTE — ED Notes (Signed)
Pt awake and actively trying to climb out of the bed. Pt pulling at lines and wires. This RN tried to assist pt back into the bed and pt swatted at nurse asking what are you doing. This RN tried to redirect pt. Pt continuously trying to climb over rails. Will administer PRN meds and continue to monitor.

## 2020-06-11 NOTE — Progress Notes (Signed)
EEG will be performed in morning if the patient is available at that time. Patient is calmly sleeping at moment, will not disturb.

## 2020-06-11 NOTE — Sepsis Progress Note (Signed)
Sepsis protocol is being monitored by [employer]. 

## 2020-06-11 NOTE — ED Provider Notes (Signed)
Patient was very agitated.  Required Haldol and Benadryl.  Patient was placed in restraints.  Rectal temp 102. Code sepsis has been called.  IV fluids and antibiotics ordered.  .Critical Care Performed by: Ripley Fraise, MD Authorized by: Ripley Fraise, MD   Critical care provider statement:    Critical care time (minutes):  45   Critical care start time:  06/11/2020 1:00 AM   Critical care end time:  06/11/2020 1:45 AM   Critical care time was exclusive of:  Separately billable procedures and treating other patients   Critical care was necessary to treat or prevent imminent or life-threatening deterioration of the following conditions:  CNS failure or compromise and sepsis   Critical care was time spent personally by me on the following activities:  Development of treatment plan with patient or surrogate, obtaining history from patient or surrogate, re-evaluation of patient's condition, pulse oximetry, ordering and review of laboratory studies and ordering and performing treatments and interventions   I assumed direction of critical care for this patient from another provider in my specialty: no        Ripley Fraise, MD 06/11/20 0225

## 2020-06-11 NOTE — ED Notes (Signed)
Pt incontinent of bladder. Full linen change. Male purwick and brief placed on pt. Pt swinging at RN while trying to change him. Pt is back to sleep at this time.

## 2020-06-11 NOTE — ED Notes (Signed)
Pt pulling off cords and male purwick. Pt bed was changed. Pt started pulling things off again and saying he had to pee. RN tried to place urinal. Pt kicked and punched at [employer]. Pt then proceeded to hold penis and pee all over himself and RN. Ativan ordered for pt at this time.

## 2020-06-11 NOTE — ED Provider Notes (Signed)
Patient seen/examined in the Emergency Department in conjunction with Midlevel Provider [place] Patient presents for continued confusion.  Family reports that this behavior is new and abnormal.  He does have extensive work-up at [facility] on April 14 Exam : Awake alert, but appears mildly agitated.  He is sitting on side of the bed and keeps trying to stand up.  He appears to move all 4 extremities. Plan: We will plan for extensive work-up for delirium.  Patient reportedly has a history of dementia, but this appears to be an acute episode.  He has already had neuroimaging including MRI.  There was a recommendation of CT angio of head and neck from MRI, which we may be able to perform once his agitation improves   Ripley Fraise, MD 06/11/20 0018

## 2020-06-12 ENCOUNTER — Inpatient Hospital Stay (HOSPITAL_COMMUNITY): Payer: Medicare Other

## 2020-06-12 DIAGNOSIS — G9341 Metabolic encephalopathy: Secondary | ICD-10-CM | POA: Diagnosis not present

## 2020-06-12 DIAGNOSIS — F0151 Vascular dementia with behavioral disturbance: Secondary | ICD-10-CM | POA: Diagnosis not present

## 2020-06-12 DIAGNOSIS — E119 Type 2 diabetes mellitus without complications: Secondary | ICD-10-CM | POA: Diagnosis not present

## 2020-06-12 DIAGNOSIS — I10 Essential (primary) hypertension: Secondary | ICD-10-CM | POA: Diagnosis not present

## 2020-06-12 LAB — COMPREHENSIVE METABOLIC PANEL
ALT: 27 U/L (ref 0–44)
AST: 40 U/L (ref 15–41)
Albumin: 2.9 g/dL — ABNORMAL LOW (ref 3.5–5.0)
Alkaline Phosphatase: 36 U/L — ABNORMAL LOW (ref 38–126)
Anion gap: 4 — ABNORMAL LOW (ref 5–15)
BUN: 20 mg/dL (ref 8–23)
CO2: 23 mmol/L (ref 22–32)
Calcium: 8.4 mg/dL — ABNORMAL LOW (ref 8.9–10.3)
Chloride: 112 mmol/L — ABNORMAL HIGH (ref 98–111)
Creatinine, Ser: 1.24 mg/dL (ref 0.61–1.24)
GFR, Estimated: 60 mL/min — ABNORMAL LOW (ref >60)
Glucose, Bld: 95 mg/dL (ref 70–99)
Potassium: 3.7 mmol/L (ref 3.5–5.1)
Sodium: 139 mmol/L (ref 135–145)
Total Bilirubin: 0.8 mg/dL (ref 0.3–1.2)
Total Protein: 5.4 g/dL — ABNORMAL LOW (ref 6.5–8.1)

## 2020-06-12 LAB — URINE CULTURE

## 2020-06-12 LAB — CBC WITH DIFFERENTIAL/PLATELET
Abs Immature Granulocytes: 0.03 10*3/uL (ref 0.00–0.07)
Basophils Absolute: 0 10*3/uL (ref 0.0–0.1)
Basophils Relative: 0 %
Eosinophils Absolute: 0.1 10*3/uL (ref 0.0–0.5)
Eosinophils Relative: 1 %
HCT: 37.6 % — ABNORMAL LOW (ref 39.0–52.0)
Hemoglobin: 12.1 g/dL — ABNORMAL LOW (ref 13.0–17.0)
Immature Granulocytes: 1 %
Lymphocytes Relative: 24 %
Lymphs Abs: 1.5 10*3/uL (ref 0.7–4.0)
MCH: 27.9 pg (ref 26.0–34.0)
MCHC: 32.2 g/dL (ref 30.0–36.0)
MCV: 86.8 fL (ref 80.0–100.0)
Monocytes Absolute: 0.5 10*3/uL (ref 0.1–1.0)
Monocytes Relative: 8 %
Neutro Abs: 4.1 10*3/uL (ref 1.7–7.7)
Neutrophils Relative %: 66 %
Platelets: 169 10*3/uL (ref 150–400)
RBC: 4.33 MIL/uL (ref 4.22–5.81)
RDW: 14.6 % (ref 11.5–15.5)
WBC: 6.1 10*3/uL (ref 4.0–10.5)
nRBC: 0 % (ref 0.0–0.2)

## 2020-06-12 LAB — AMMONIA: Ammonia: 11 umol/L (ref 9–35)

## 2020-06-12 LAB — GLUCOSE, CAPILLARY
Glucose-Capillary: 78 mg/dL (ref 70–99)
Glucose-Capillary: 79 mg/dL (ref 70–99)
Glucose-Capillary: 98 mg/dL (ref 70–99)
Glucose-Capillary: 101 mg/dL — ABNORMAL HIGH (ref 70–99)
Glucose-Capillary: 113 mg/dL — ABNORMAL HIGH (ref 70–99)

## 2020-06-12 LAB — VITAMIN B12: Vitamin B-12: 219 pg/mL (ref 180–914)

## 2020-06-12 LAB — D-DIMER, QUANTITATIVE: D-Dimer, Quant: 1.64 ug/mL-FEU — ABNORMAL HIGH (ref 0.00–0.50)

## 2020-06-12 LAB — C-REACTIVE PROTEIN: CRP: 7 mg/dL — ABNORMAL HIGH (ref <1.0)

## 2020-06-12 MED ORDER — ATORVASTATIN CALCIUM 40 MG PO TABS
40.0000 mg | ORAL_TABLET | Freq: Every day | ORAL | Status: DC
  Administered 2020-06-12 – 2020-07-14 (×33): 40 mg via ORAL
  Filled 2020-06-12 (×9): qty 1
  Filled 2020-06-12: qty 4
  Filled 2020-06-12 (×22): qty 1
  Filled 2020-06-12: qty 4

## 2020-06-12 MED ORDER — ASPIRIN EC 81 MG PO TBEC
81.0000 mg | DELAYED_RELEASE_TABLET | Freq: Every day | ORAL | Status: DC
  Administered 2020-06-12 – 2020-07-14 (×33): 81 mg via ORAL
  Filled 2020-06-12 (×33): qty 1

## 2020-06-12 MED ORDER — INSULIN ASPART 100 UNIT/ML ~~LOC~~ SOLN
0.0000 [IU] | Freq: Three times a day (TID) | SUBCUTANEOUS | Status: DC
  Administered 2020-06-14 – 2020-06-15 (×3): 2 [IU] via SUBCUTANEOUS
  Administered 2020-06-15: 1 [IU] via SUBCUTANEOUS
  Administered 2020-06-16: 3 [IU] via SUBCUTANEOUS
  Administered 2020-06-16 (×2): 1 [IU] via SUBCUTANEOUS
  Administered 2020-06-17: 3 [IU] via SUBCUTANEOUS
  Administered 2020-06-17: 1 [IU] via SUBCUTANEOUS
  Administered 2020-06-17 – 2020-06-18 (×2): 2 [IU] via SUBCUTANEOUS
  Administered 2020-06-18 (×2): 1 [IU] via SUBCUTANEOUS
  Administered 2020-06-19: 3 [IU] via SUBCUTANEOUS
  Administered 2020-06-19: 1 [IU] via SUBCUTANEOUS
  Administered 2020-06-19 – 2020-06-20 (×2): 2 [IU] via SUBCUTANEOUS
  Administered 2020-06-20 – 2020-06-21 (×4): 1 [IU] via SUBCUTANEOUS
  Administered 2020-06-22: 2 [IU] via SUBCUTANEOUS
  Administered 2020-06-22: 1 [IU] via SUBCUTANEOUS
  Administered 2020-06-22 – 2020-06-23 (×2): 2 [IU] via SUBCUTANEOUS
  Administered 2020-06-23: 3 [IU] via SUBCUTANEOUS
  Administered 2020-06-23 – 2020-06-24 (×3): 2 [IU] via SUBCUTANEOUS
  Administered 2020-06-25: 3 [IU] via SUBCUTANEOUS
  Administered 2020-06-25 – 2020-06-26 (×4): 2 [IU] via SUBCUTANEOUS
  Administered 2020-06-27: 3 [IU] via SUBCUTANEOUS
  Administered 2020-06-27: 2 [IU] via SUBCUTANEOUS
  Administered 2020-06-27: 1 [IU] via SUBCUTANEOUS
  Administered 2020-06-28: 2 [IU] via SUBCUTANEOUS
  Administered 2020-06-28: 3 [IU] via SUBCUTANEOUS
  Administered 2020-06-28: 1 [IU] via SUBCUTANEOUS
  Administered 2020-06-29: 2 [IU] via SUBCUTANEOUS
  Administered 2020-06-29: 3 [IU] via SUBCUTANEOUS
  Administered 2020-06-30 (×2): 1 [IU] via SUBCUTANEOUS
  Administered 2020-06-30: 5 [IU] via SUBCUTANEOUS
  Administered 2020-07-01: 2 [IU] via SUBCUTANEOUS
  Administered 2020-07-01 (×2): 3 [IU] via SUBCUTANEOUS
  Administered 2020-07-02: 5 [IU] via SUBCUTANEOUS
  Administered 2020-07-02: 2 [IU] via SUBCUTANEOUS
  Administered 2020-07-02 – 2020-07-03 (×2): 5 [IU] via SUBCUTANEOUS
  Administered 2020-07-03 – 2020-07-04 (×2): 3 [IU] via SUBCUTANEOUS
  Administered 2020-07-04: 1 [IU] via SUBCUTANEOUS
  Administered 2020-07-04 – 2020-07-05 (×2): 3 [IU] via SUBCUTANEOUS
  Administered 2020-07-05: 2 [IU] via SUBCUTANEOUS
  Administered 2020-07-06 (×2): 3 [IU] via SUBCUTANEOUS
  Administered 2020-07-07: 7 [IU] via SUBCUTANEOUS
  Administered 2020-07-07: 2 [IU] via SUBCUTANEOUS
  Administered 2020-07-07: 1 [IU] via SUBCUTANEOUS
  Administered 2020-07-08: 2 [IU] via SUBCUTANEOUS
  Administered 2020-07-08: 3 [IU] via SUBCUTANEOUS
  Administered 2020-07-08: 5 [IU] via SUBCUTANEOUS
  Administered 2020-07-09: 2 [IU] via SUBCUTANEOUS
  Administered 2020-07-09: 3 [IU] via SUBCUTANEOUS
  Administered 2020-07-09 – 2020-07-10 (×2): 2 [IU] via SUBCUTANEOUS
  Administered 2020-07-10: 3 [IU] via SUBCUTANEOUS
  Administered 2020-07-10: 5 [IU] via SUBCUTANEOUS
  Administered 2020-07-11: 2 [IU] via SUBCUTANEOUS
  Administered 2020-07-11: 5 [IU] via SUBCUTANEOUS
  Administered 2020-07-11: 3 [IU] via SUBCUTANEOUS
  Administered 2020-07-12 (×2): 2 [IU] via SUBCUTANEOUS
  Administered 2020-07-12: 7 [IU] via SUBCUTANEOUS

## 2020-06-12 MED ORDER — HALOPERIDOL LACTATE 5 MG/ML IJ SOLN
4.0000 mg | Freq: Once | INTRAMUSCULAR | Status: AC
  Administered 2020-06-12: 4 mg via INTRAVENOUS

## 2020-06-12 MED ORDER — MEMANTINE HCL 10 MG PO TABS
10.0000 mg | ORAL_TABLET | Freq: Two times a day (BID) | ORAL | Status: DC
Start: 2020-06-12 — End: 2020-06-16
  Administered 2020-06-12 – 2020-06-15 (×7): 10 mg via ORAL
  Filled 2020-06-12 (×7): qty 1

## 2020-06-12 MED ORDER — HALOPERIDOL LACTATE 5 MG/ML IJ SOLN
2.0000 mg | Freq: Four times a day (QID) | INTRAMUSCULAR | Status: DC | PRN
  Administered 2020-06-12 – 2020-06-13 (×2): 2 mg via INTRAVENOUS
  Filled 2020-06-12 (×3): qty 1

## 2020-06-12 MED ORDER — HALOPERIDOL 1 MG PO TABS
2.0000 mg | ORAL_TABLET | Freq: Four times a day (QID) | ORAL | Status: DC | PRN
  Administered 2020-06-12: 2 mg via ORAL
  Filled 2020-06-12 (×2): qty 2

## 2020-06-12 MED ORDER — AMLODIPINE BESYLATE 5 MG PO TABS
5.0000 mg | ORAL_TABLET | Freq: Two times a day (BID) | ORAL | Status: DC
  Administered 2020-06-12 – 2020-06-13 (×3): 5 mg via ORAL
  Filled 2020-06-12 (×3): qty 1

## 2020-06-12 NOTE — Progress Notes (Signed)
EEG complete - results pending 

## 2020-06-12 NOTE — Progress Notes (Signed)
Wrist restraints ordered earlier and placed but patient removed them and ambulated around the room. Bilateral wrist and ankle restraints ordered were placed at 1830 after patient was given seroquel and oral hydration and ambulated in the room and performed some diversional activities such as folding linen and making the bed with this RN.

## 2020-06-12 NOTE — Evaluation (Signed)
Physical Therapy Evaluation Patient Details Name: Patrick Phillips MRN: [id number] DOB: 06-Nov-1940 Today's Date: 06/12/2020   History of Present Illness  80 y.o. male with presented with fever and confusion. Pt with dementia however still highly functional, took dog for a walk and dog came back without him. Pt later found lying on the side of road with no memory of what had happened but able to follow commands, taken to [facility] where MRI was negative for stroke and x-ray negative for fx. D/c home with daughter but pt with increasing confusion pt then brought  to [facility] ED 06/10/20. Pt with increasing confusion and agitation. Pt found to be febrile and COVID+. Admitted for acute metabolic encephalopathy.    PMHx of vascular dementia, prior CVA, DM-2, HTN  Clinical Impression  Pt daughter [name] in room provides PLOF and home set up. PTA pt living alone in mobile home with 3 steps to enter. Pt was independent with community level ambulation without AD, helping family with stacking wood, and driving regularly. Pt independent with bathing and dressing, "washes up daily" showers once a week. Pt can fix small meals, family provides dinner, and manages finances and medication. Pt is currently limited in safe mobility by poor cognition especially of his deficits and safety in presence of generalized weakness and associated decrease in balance. Pt is supervision for all mobility without AD. Although pt is not at his baseline, he will not do well with stranger coming to his home so no follow up PT recommended.  PT will work with him acutely to maximize safe mobility and educate family on exercise progression before going home.     Follow Up Recommendations No PT follow up    Equipment Recommendations  None recommended by PT       Precautions / Restrictions Precautions Precautions: Fall Precaution Comments: found down along side of road Restrictions Weight Bearing Restrictions: No      Mobility  Bed  Mobility               General bed mobility comments: OOB in bathroom with sitter on entry    Transfers Overall transfer level: Needs assistance Equipment used: None Transfers: Sit to/from Stand Sit to Stand: Supervision         General transfer comment: supervision for safety  Ambulation/Gait Ambulation/Gait assistance: Supervision Gait Distance (Feet): 40 Feet Assistive device: None Gait Pattern/deviations: Step-through pattern;Decreased step length - right;Decreased step length - left Gait velocity: slowed Gait velocity interpretation: <1.31 ft/sec, indicative of household ambulator General Gait Details: supervision for slowed, mildly unsteady gait, no LoB occassional reaching to touch furniture in room to steady         Balance Overall balance assessment: Mild deficits observed, not formally tested                                           Pertinent Vitals/Pain Pain Assessment: Faces Faces Pain Scale: Hurts little more Pain Location: L shoulder with movement, possibly due to fall Pain Descriptors / Indicators: Aching;Sore Pain Intervention(s): Limited activity within patient's tolerance;Monitored during session;Repositioned    Home Living Family/patient expects to be discharged to:: Private residence Living Arrangements: Alone Available Help at Discharge: Family;Available 24 hours/day Type of Home: Mobile home Home Access: Stairs to enter   Entrance Stairs-Number of Steps: 3 Home Layout: One level Home Equipment: None      Prior Function  Level of Independence: Needs assistance   Gait / Transfers Assistance Needed: independent with community level ambulation  ADL's / Homemaking Assistance Needed: does a wash up regularl, need to be reminded to take showers once a week by family, daughter manages medication and bills, family provides dinner nightly  Comments: pt has been driving and stacking fire wood within the last week         Extremity/Trunk Assessment   Upper Extremity Assessment Upper Extremity Assessment: Defer to OT evaluation    Lower Extremity Assessment Lower Extremity Assessment: Overall WFL for tasks assessed       Communication   Communication: No difficulties  Cognition Arousal/Alertness: Awake/alert Behavior During Therapy: WFL for tasks assessed/performed Overall Cognitive Status: History of cognitive impairments - at baseline Area of Impairment: Orientation;Attention;Memory;Following commands;Safety/judgement;Awareness;Problem solving                 Orientation Level: Disoriented to;Time;Place;Situation Current Attention Level: Selective Memory: Decreased short-term memory Following Commands: Follows multi-step commands with increased time;Follows one step commands consistently Safety/Judgement: Decreased awareness of deficits Awareness: Emergent Problem Solving: Requires verbal cues;Requires tactile cues General Comments: daughter describes dementia prior to hospitalization mainly memory deficits in terms of names and situations, current level worse, needs constant redirection back to task/question at hand      General Comments General comments (skin integrity, edema, etc.): daughter present provides home set up and PLOF, VSS on RA        Assessment/Plan    PT Assessment Patient needs continued PT services  PT Problem List Decreased cognition;Decreased activity tolerance;Decreased safety awareness       PT Treatment Interventions Gait training;Functional mobility training;Therapeutic exercise;Balance training;Patient/family education;Cognitive remediation    PT Goals (Current goals can be found in the Care Plan section)  Acute Rehab PT Goals Patient Stated Goal: go home to [place] his dog PT Goal Formulation: With patient/family Time For Goal Achievement: 06/26/20 Potential to Achieve Goals: Good    Frequency Min 3X/week   Barriers to discharge Decreased caregiver  support daughter reports they are working on 24 hour assist       AM-PAC PT "6 Clicks" Mobility  Outcome Measure Help needed turning from your back to your side while in a flat bed without using bedrails?: None Help needed moving from lying on your back to sitting on the side of a flat bed without using bedrails?: None Help needed moving to and from a bed to a chair (including a wheelchair)?: None Help needed standing up from a chair using your arms (e.g., wheelchair or bedside chair)?: None Help needed to walk in hospital room?: None Help needed climbing 3-5 steps with a railing? : A Little 6 Click Score: 23    End of Session   Activity Tolerance: Patient tolerated treatment well Patient left: in chair;with call bell/phone within reach;with chair alarm set;with family/visitor present Nurse Communication: Mobility status (improved mentation) PT Visit Diagnosis: Unsteadiness on feet (R26.81);Other abnormalities of gait and mobility (R26.89);Muscle weakness (generalized) (M62.81);Difficulty in walking, not elsewhere classified (R26.2)    Time: 2263-3354 PT Time Calculation (min) (ACUTE ONLY): 35 min   Charges:   PT Evaluation $PT Eval Moderate Complexity: 1 Mod PT Treatments $Therapeutic Activity: 8-22 mins        Genora Arp B. Migdalia Dk PT, DPT Acute Rehabilitation Services Pager [phone] Office [phone]   [place] 06/12/2020, 11:37 AM

## 2020-06-12 NOTE — Progress Notes (Signed)
   06/12/20 1830  Restraint Order  Length of Order Daily  Assessment  Less Restrictive Interventions Attempted Yes  Health history reviewed prior to applying restraint Yes  Justification  Clinical Justification Pulling lines;Removal of dressing (broke bed cords, fought staff)  Plan to Progress Out Reality orientation;Interdisciplinary collaboration;Continue safety plan  Education  Discontinuation Criteria No longer interfering with care;Responding to safe limit settings;Alternative intervention effective  Discontinuation Criteria Explained Yes  Family Notification Other Family (daughter)  Restraint Every 2 Hour Monitoring  Airway Clear with Spontaneous Respirations Yes  Circulation / Skin Integrity No signs of injury  Emotional / Mental Status Confused;Agitated/restless;Hallucination;Other (Comment) (tried to touch staffs private parts, saw things crawling in the room)  Range of Motion Performed  Food and Fluids Fluid/Food offered  Elimination Toileted  Patient's rights, dignity, safety maintained Yes  Can Restraints be Less Restrictive or Discontinued? No  Medical Device Prophylactic Dressing Under Restraints  Skin Assessed Under All Medical Device Prophylactic Dressings (if applicable) Done - Skin Intact  Non-violent Restraints  Soft Restraint Right Wrist Start  Soft Restraint Left Wrist Start  Soft Restraint Right Ankle Start  Soft Restraint Left Ankle Start

## 2020-06-12 NOTE — Progress Notes (Signed)
OT Cancellation Note  Patient Details Name: Patrick Phillips MRN: [id number] DOB: [birth year]   Cancelled Treatment:    Reason Eval/Treat Not Completed: Patient at procedure or test/ unavailable Pt with EEG tech present. OT will return as time allows and pt is appropriate.   [facility] OTR/L Acute Rehabilitation Services Office: [place] 06/12/2020, 2:21 PM

## 2020-06-12 NOTE — Procedures (Signed)
Patient Name: Patrick Phillips  MRN: [id number]  Epilepsy Attending: Lora Havens  Referring Physician/Provider: Dr Oren Binet Date: 06/12/2020 Duration: 25.42 mins  Patient history: 80yo M with ams. EEG to evaluate for seizure  Level of alertness: Awake  AEDs during EEG study: None  Technical aspects: This EEG study was done with scalp electrodes positioned according to the 10-20 International system of electrode placement. Electrical activity was acquired at a sampling rate of 500Hz  and reviewed with a high frequency filter of 70Hz  and a low frequency filter of 1Hz . EEG data were recorded continuously and digitally stored.   Description: No posterior dominant rhythm was seen. EEG showed continuous generalized 5 to 6 Hz theta slowing. Hyperventilation and photic stimulation were not performed.     ABNORMALITY - Continuous slow, generalized  IMPRESSION: This study is suggestive of mild to moderate diffuse encephalopathy, nonspecific etiology. No seizures or epileptiform discharges were seen throughout the recording.  [name]

## 2020-06-12 NOTE — Progress Notes (Signed)
PROGRESS NOTE                                                                                                                                                                                                             Patient Demographics:    Winnie Barsky, is a 80 y.o. male, DOB - Jul 08, 1940, SPQ:[id number]  Outpatient Primary MD for the patient is [name], MD   Admit date - 06/10/2020   LOS - 1  Chief Complaint  Patient presents with  . Altered Mental Status       Brief Narrative: Patient is a 80 y.o. male with PMHx of vascular dementia, prior CVA, DM-2, HTN-who presented with fever and confusion.  COVID-19 vaccinated status: Unknown  Significant Events: 4/14>> Admit to [facility] for fever/confusion  Significant studies: 4/14>>Chest x-ray: No PNA 4/14>> CT head: No acute intracranial abnormality. 4/14>> CT C-spine: No fracture 4/14>> MRI brain: No acute infarct. 4/15>> chest x-ray: No PNA 4/15>> CSF: WBC 1 (tube #4), protein 46, glucose 54  COVID-19 medications: Remdesivir: 4/15>>  Antibiotics: Vancomycin: 4/14 x1 Ceftriaxone: 4/14 x 1 Ampicillin: 4/14 x 1  Microbiology data: 4/14 >>blood culture: No growth 4/14>> urine culture: No growth 4/15>> blood culture: No growth 4/15>> urine culture: Multiple species 4/15>> CSF culture: No growth  Procedures: None  Consults: None  DVT prophylaxis: enoxaparin (LOVENOX) injection 40 mg Start: 06/11/20 0600    Subjective:   Significantly better-much more awake and alert-only minimally confused-answers most of my questions appropriately.  Able to tell me his name, where he lives-his children's name.   Assessment  & Plan :   Acute metabolic encephalopathy: Likely related to febrile illness/COVID-19 infection-all culture data negative so far.  Neuroimaging as above.  Awaiting EEG.  Daughter at bedside-acknowledges improvement but per family-not yet at  baseline.  Suspect that he will have some amount of delirium during this hospital stay given history of underlying dementia.  Continue Seroquel  SIRS-COVID-19 infection: Continue Remdesivir x3 days-no pneumonia on imaging-awaiting cultures.  Hold off on further antibiotic therapy.  Interestingly-patient apparently had COVID-19 infection in February as well.  Recent Labs    06/11/20 0445 06/12/20 0455  DDIMER 1.81* 1.64*  CRP 7.5* 7.0*    Lab Results  Component Value Date   SARSCOV2NAA POSITIVE (A) 06/11/2020   [place] NEGATIVE 01/13/2020  AKI: Mild-likely hemodynamically mediated-solved with IV fluids.  HTN: BP on the higher side-resume amlodipine now that he is much more awake and alert.  DM-2: CBGs relatively stable-continue SSI-oral hypoglycemic agents on hold  Recent Labs    06/12/20 0411 06/12/20 0753 06/12/20 1151  GLUCAP 78 79 98   History of CVA-MRI brain negative for infarct-resume aspirin/statin now that he is much more awake and alert.  Dementia: Resume Namenda now that he is much more awake and alert.  GERD: On PPI   GI prophylaxis: PPI  ABG:    Component Value Date/Time   TCO2 18 (L) 06/10/2020 1026    Vent Settings: N/A  Condition - Extremely Guarded  Family Communication  :  Daughter-Brenda-[phone] at bedside on 4/16.  Code Status :  Full Code  Diet :  Diet Order            Diet heart healthy/carb modified Room service appropriate? Yes; Fluid consistency: Thin  Diet effective now                  Disposition Plan  :   Status is: Inpatient  Remains inpatient appropriate because:Inpatient level of care appropriate due to severity of illness   Dispo: The patient is from: Home              Anticipated d/c is to: Home              Patient currently is not medically stable to d/c.   Difficult to place patient No   Barriers to discharge: Resolving encephalopathy-awaiting final culture data.  Antimicorbials  :     Anti-infectives (From admission, onward)   Start     Dose/Rate Route Frequency Ordered Stop   06/12/20 1000  remdesivir 100 mg in sodium chloride 0.9 % 100 mL IVPB        100 mg 200 mL/hr over 30 Minutes Intravenous Daily 06/11/20 0435 06/16/20 0959   06/11/20 0445  remdesivir 200 mg in sodium chloride 0.9% 250 mL IVPB        200 mg 580 mL/hr over 30 Minutes Intravenous Once 06/11/20 0434 06/11/20 0929   06/11/20 0230  vancomycin (VANCOREADY) IVPB 1500 mg/300 mL        1,500 mg 150 mL/hr over 120 Minutes Intravenous  Once 06/11/20 0229 06/11/20 0812   06/11/20 0215  ampicillin (OMNIPEN) 2 g in sodium chloride 0.9 % 100 mL IVPB        2 g 300 mL/hr over 20 Minutes Intravenous  Once 06/11/20 0157 06/11/20 0550   06/11/20 0215  vancomycin (VANCOREADY) IVPB 1000 mg/200 mL  Status:  Discontinued        1,000 mg 200 mL/hr over 60 Minutes Intravenous  Once 06/11/20 0157 06/11/20 0229   06/11/20 0200  cefTRIAXone (ROCEPHIN) 2 g in sodium chloride 0.9 % 100 mL IVPB        2 g 200 mL/hr over 30 Minutes Intravenous  Once 06/11/20 0157 06/11/20 0248      Inpatient Medications  Scheduled Meds: . enoxaparin (LOVENOX) injection  40 mg Subcutaneous Q24H  . insulin aspart  0-9 Units Subcutaneous TID WC  . pantoprazole (PROTONIX) IV  40 mg Intravenous Q24H  . QUEtiapine  25 mg Oral BID   Continuous Infusions: . lactated ringers 75 mL/hr at 06/11/20 1328  . remdesivir 100 mg in NS 100 mL 100 mg (06/12/20 1147)   PRN Meds:.acetaminophen, haloperidol lactate, ondansetron **OR** ondansetron (ZOFRAN) IV   Time Spent in minutes  25    See all Orders from today for further details   Oren Binet M.D on 06/12/2020 at 1:01 PM  To page go to [email or web address] - use universal password  [facility] -  Office  [phone]    Objective:   Vitals:   06/12/20 0447 06/12/20 0743 06/12/20 1154 06/12/20 1249  BP: (!) 155/70 (!) 165/75 (!) 146/65 (!) 147/68  Pulse: 66 64 66 62  Resp: 19  18 18 17   Temp: 98.1 F (36.7 C) 98.1 F (36.7 C) 97.7 F (36.5 C) 98.3 F (36.8 C)  TempSrc: Axillary Oral Oral Oral  SpO2: 96% 93% 93% 99%  Weight:      Height:        Wt Readings from Last 3 Encounters:  06/10/20 86.2 kg  06/10/20 86.2 kg  04/19/20 86.2 kg     Intake/Output Summary (Last 24 hours) at 06/12/2020 1301 Last data filed at 06/12/2020 0956 Gross per 24 hour  Intake 826.28 ml  Output 1000 ml  Net -173.72 ml     Physical Exam Gen Exam:Alert awake-not in any distress HEENT:atraumatic, normocephalic Chest: B/L clear to auscultation anteriorly CVS:S1S2 regular Abdomen:soft non tender, non distended Extremities:no edema Neurology: Non focal Skin: no rash   Data Review:    CBC Recent Labs  Lab 06/10/20 1026 06/11/20 0100 06/12/20 0455  WBC  --  8.4 6.1  HGB 15.3 13.1 12.1*  HCT 45.0 40.9 37.6*  PLT  --  211 169  MCV  --  88.5 86.8  MCH  --  28.4 27.9  MCHC  --  32.0 32.2  RDW  --  14.6 14.6  LYMPHSABS  --  0.7 1.5  MONOABS  --  0.9 0.5  EOSABS  --  0.0 0.1  BASOSABS  --  0.0 0.0    Chemistries  Recent Labs  Lab 06/10/20 1026 06/10/20 1055 06/11/20 0100 06/12/20 0455  NA 140 136 135 139  K 4.5 4.4 3.5 3.7  CL 109 107 109 112*  CO2  --  16* 19* 23  GLUCOSE 287* 297* 89 95  BUN 47* 50* 43* 20  CREATININE 1.50* 1.53* 1.49* 1.24  CALCIUM  --  8.7* 8.5* 8.4*  AST  --  29 27 40  ALT  --  30 26 27   ALKPHOS  --  51 42 36*  BILITOT  --  0.6 0.8 0.8   ------------------------------------------------------------------------------------------------------------------ No results for input(s): CHOL, HDL, LDLCALC, TRIG, CHOLHDL, LDLDIRECT in the last 72 hours.  Lab Results  Component Value Date   HGBA1C 6.6 (H) 06/11/2020   ------------------------------------------------------------------------------------------------------------------ No results for input(s): TSH, T4TOTAL, T3FREE, THYROIDAB in the last 72 hours.  Invalid input(s):  FREET3 ------------------------------------------------------------------------------------------------------------------ Recent Labs    06/12/20 0455  VITAMINB12 219    Coagulation profile Recent Labs  Lab 06/11/20 0100  INR 1.1    Recent Labs    06/11/20 0445 06/12/20 0455  DDIMER 1.81* 1.64*    Cardiac Enzymes No results for input(s): CKMB, TROPONINI, MYOGLOBIN in the last 168 hours.  Invalid input(s): CK ------------------------------------------------------------------------------------------------------------------    Component Value Date/Time   BNP 25.0 10/10/2017 2103    Micro Results Recent Results (from the past 240 hour(s))  Culture, blood (Routine X 2) w Reflex to ID Panel     Status: None (Preliminary result)   Collection Time: 06/10/20 11:32 AM   Specimen: BLOOD  Result Value Ref Range Status   Specimen Description BLOOD RIGHT ANTECUBITAL  Final   Special Requests  Final    Blood Culture adequate volume BOTTLES DRAWN AEROBIC AND ANAEROBIC   Culture   Final    NO GROWTH 2 DAYS Performed at [facility], [address], [place], [place] [address]    Report Status PENDING  Incomplete  Culture, blood (Routine X 2) w Reflex to ID Panel     Status: None (Preliminary result)   Collection Time: 06/10/20 11:33 AM   Specimen: BLOOD  Result Value Ref Range Status   Specimen Description BLOOD BLOOD RIGHT WRIST  Final   Special Requests   Final    Blood Culture adequate volume BOTTLES DRAWN AEROBIC AND ANAEROBIC   Culture   Final    NO GROWTH 2 DAYS Performed at [facility], [address] [place], [place] [address]    Report Status PENDING  Incomplete  Urine Culture     Status: None   Collection Time: 06/10/20  1:05 PM   Specimen: Urine, Clean Catch  Result Value Ref Range Status   Specimen Description   Final    URINE, CLEAN CATCH Performed at [facility], [address], [place], [place] [address]    Special Requests   Final    NONE Performed  at [facility], [address], [place], [place] [address]    Culture   Final    NO GROWTH Performed at [facility], [place] [address], [place], [place] [address]    Report Status 06/11/2020 FINAL  Final  Blood Culture (routine x 2)     Status: None (Preliminary result)   Collection Time: 06/11/20  1:00 AM   Specimen: BLOOD LEFT ARM  Result Value Ref Range Status   Specimen Description BLOOD LEFT ARM  Final   Special Requests   Final    BOTTLES DRAWN AEROBIC AND ANAEROBIC Blood Culture adequate volume   Culture   Final    NO GROWTH 1 DAY Performed at [facility], [place] [address], [place], [place] [address]    Report Status PENDING  Incomplete  Urine culture     Status: Abnormal   Collection Time: 06/11/20  1:49 AM   Specimen: In/Out Cath Urine  Result Value Ref Range Status   Specimen Description IN/OUT CATH URINE  Final   Special Requests   Final    NONE Performed at [facility], [place] [address], [place], [place] [address]    Culture MULTIPLE SPECIES PRESENT, SUGGEST RECOLLECTION (A)  Final   Report Status 06/12/2020 FINAL  Final  Blood Culture (routine x 2)     Status: None (Preliminary result)   Collection Time: 06/11/20  2:00 AM   Specimen: BLOOD RIGHT ARM  Result Value Ref Range Status   Specimen Description BLOOD RIGHT ARM  Final   Special Requests   Final    BOTTLES DRAWN AEROBIC AND ANAEROBIC Blood Culture adequate volume   Culture   Final    NO GROWTH 1 DAY Performed at [facility], [place] [address] [place],  [address]    Report Status PENDING  Incomplete  Resp Panel by RT-PCR (Flu A&B, Covid) Nasopharyngeal Swab     Status: Abnormal   Collection Time: 06/11/20  2:29 AM   Specimen: Nasopharyngeal Swab; Nasopharyngeal(NP) swabs in vial transport medium  Result Value Ref Range Status   SARS Coronavirus 2 by RT PCR POSITIVE (A) NEGATIVE Final    Comment: RESULT CALLED TO, READ BACK BY AND VERIFIED WITH: K MOON RN 06/11/20 0411  JDW (  NOTE) SARS-CoV-2 target nucleic acids are DETECTED.  The SARS-CoV-2 RNA is generally detectable in upper respiratory specimens during the acute phase of infection. Positive results are indicative of the presence of the identified virus, but do not rule out bacterial infection or co-infection with other pathogens not detected by the test. Clinical correlation with patient history and other diagnostic information is necessary to determine patient infection status. The expected result is Negative.  Fact Sheet for Patients: EntrepreneurPulse.com.au  Fact Sheet for Healthcare Providers: IncredibleEmployment.be  This test is not yet approved or cleared by the [place] FDA and  has been authorized for detection and/or diagnosis of SARS-CoV-2 by FDA under an Emergency Use Authorization (EUA).  This EUA will remain in effect (meaning this test can be used)  for the duration of  the COVID-19 declaration under Section 564(b)(1) of the Act, 21 U.S.C. section 360bbb-3(b)(1), unless the authorization is terminated or revoked sooner.     Influenza A by PCR NEGATIVE NEGATIVE Final   Influenza B by PCR NEGATIVE NEGATIVE Final    Comment: (NOTE) The Xpert Xpress SARS-CoV-2/FLU/RSV plus assay is intended as an aid in the diagnosis of influenza from Nasopharyngeal swab specimens and should not be used as a sole basis for treatment. Nasal washings and aspirates are unacceptable for Xpert Xpress SARS-CoV-2/FLU/RSV testing.  Fact Sheet for Patients: EntrepreneurPulse.com.au  Fact Sheet for Healthcare Providers: IncredibleEmployment.be  This test is not yet approved or cleared by the [place] FDA and has been authorized for detection and/or diagnosis of SARS-CoV-2 by FDA under an Emergency Use Authorization (EUA). This EUA will remain in effect (meaning this test can be used) for the duration of the COVID-19  declaration under Section 564(b)(1) of the Act, 21 U.S.C. section 360bbb-3(b)(1), unless the authorization is terminated or revoked.  Performed at [facility], [place] [address], [place], [place] [address]   CSF culture     Status: None (Preliminary result)   Collection Time: 06/11/20  5:20 AM   Specimen: CSF; Cerebrospinal Fluid  Result Value Ref Range Status   Specimen Description CSF  Final   Special Requests NONE  Final   Gram Stain NO WBC SEEN NO ORGANISMS SEEN CYTOSPIN SMEAR   Final   Culture   Final    NO GROWTH 1 DAY Performed at [facility], [place] [address] [place], [place] [address]    Report Status PENDING  Incomplete    Radiology Reports CT Head Wo Contrast  Result Date: 06/10/2020 CLINICAL DATA:  Head and C-spine injury, fall, altered mental status EXAM: CT HEAD WITHOUT CONTRAST CT CERVICAL SPINE WITHOUT CONTRAST TECHNIQUE: Multidetector CT imaging of the head and cervical spine was performed following the standard protocol without intravenous contrast. Multiplanar CT image reconstructions of the cervical spine were also generated. COMPARISON:  05/04/2020 FINDINGS: CT HEAD FINDINGS Brain: Stable mild brain atrophy pattern. No acute intracranial hemorrhage, new mass lesion, acute infarction, midline shift, herniation, hydrocephalus, or extra-axial fluid collection. No focal mass effect or edema. Cisterns are patent. Cerebellar atrophy as well. Vascular: Intracranial atherosclerosis at the skull base. No hyperdense vessel. Skull: Normal. Negative for fracture or focal lesion. Sinuses/Orbits: Minor scattered sinus mucosal thickening as before. Orbits are symmetric. No acute finding. Other: None. CT CERVICAL SPINE FINDINGS Alignment: Normal. Skull base and vertebrae: No acute fracture. No primary bone lesion or focal pathologic process. Soft tissues and spinal canal: Normal prevertebral soft tissues. No hemorrhage or hematoma appreciated. Carotid atherosclerosis. Disc  levels: Similar mild  multilevel degenerative disc disease and facet arthropathy. Facets are aligned. No subluxation or dislocation. Preserved vertebral body heights. Upper chest: Negative. Other: None. IMPRESSION: Stable brain atrophy pattern. No acute intracranial abnormality or interval change by noncontrast CT. Stable mild cervical degenerative changes as above. No acute osseous finding or malalignment by CT. Electronically Signed   By: Jerilynn Mages.  Shick M.D.   On: 06/10/2020 12:31   CT Cervical Spine Wo Contrast  Result Date: 06/10/2020 CLINICAL DATA:  Head and C-spine injury, fall, altered mental status EXAM: CT HEAD WITHOUT CONTRAST CT CERVICAL SPINE WITHOUT CONTRAST TECHNIQUE: Multidetector CT imaging of the head and cervical spine was performed following the standard protocol without intravenous contrast. Multiplanar CT image reconstructions of the cervical spine were also generated. COMPARISON:  05/04/2020 FINDINGS: CT HEAD FINDINGS Brain: Stable mild brain atrophy pattern. No acute intracranial hemorrhage, new mass lesion, acute infarction, midline shift, herniation, hydrocephalus, or extra-axial fluid collection. No focal mass effect or edema. Cisterns are patent. Cerebellar atrophy as well. Vascular: Intracranial atherosclerosis at the skull base. No hyperdense vessel. Skull: Normal. Negative for fracture or focal lesion. Sinuses/Orbits: Minor scattered sinus mucosal thickening as before. Orbits are symmetric. No acute finding. Other: None. CT CERVICAL SPINE FINDINGS Alignment: Normal. Skull base and vertebrae: No acute fracture. No primary bone lesion or focal pathologic process. Soft tissues and spinal canal: Normal prevertebral soft tissues. No hemorrhage or hematoma appreciated. Carotid atherosclerosis. Disc levels: Similar mild multilevel degenerative disc disease and facet arthropathy. Facets are aligned. No subluxation or dislocation. Preserved vertebral body heights. Upper chest: Negative. Other:  None. IMPRESSION: Stable brain atrophy pattern. No acute intracranial abnormality or interval change by noncontrast CT. Stable mild cervical degenerative changes as above. No acute osseous finding or malalignment by CT. Electronically Signed   By: Jerilynn Mages.  Shick M.D.   On: 06/10/2020 12:31   MR Brain Wo Contrast (neuro protocol)  Result Date: 06/10/2020 CLINICAL DATA:  Acute neuro deficit.  Mental status change. EXAM: MRI HEAD WITHOUT CONTRAST TECHNIQUE: Multiplanar, multiecho pulse sequences of the brain and surrounding structures were obtained without intravenous contrast. COMPARISON:  CT head 06/10/2020 FINDINGS: Brain: Ventricle size and cerebral volume normal for age. Negative for acute infarct. No significant chronic ischemic change. Negative for hemorrhage or mass. Image quality degraded by mild motion. Vascular: Partial loss of flow void in the left internal carotid artery through the skull base and the distal left vertebral artery which may be due to slow flow or stenosis. Skull and upper cervical spine: Negative Sinuses/Orbits: Mild mucosal edema paranasal sinuses. No orbital mass. Bilateral exophthalmos. Other: None IMPRESSION: Negative for acute infarct. Partial loss of flow void in the left internal carotid artery distal left vertebral artery which could be due to proximal stenosis. Consider CT angio head and neck for further evaluation. Electronically Signed   By: Franchot Gallo M.D.   On: 06/10/2020 15:09   DG Chest Port 1 View  Result Date: 06/11/2020 CLINICAL DATA:  Questionable sepsis. EXAM: PORTABLE CHEST 1 VIEW COMPARISON:  June 10, 2020 FINDINGS: There is no evidence of acute infiltrate, pleural effusion or pneumothorax. The heart size and mediastinal contours are within normal limits. A radiopaque loop recorder device is seen. The visualized skeletal structures are unremarkable. IMPRESSION: No active disease. Electronically Signed   By: Virgina Norfolk M.D.   On: 06/11/2020 03:10   DG  Chest Port 1 View  Result Date: 06/10/2020 CLINICAL DATA:  Altered mental status. EXAM: PORTABLE CHEST 1 VIEW COMPARISON:  May 25, 2020  FINDINGS: Loop recorder in place. Cardiomediastinal silhouette is normal. Mediastinal contours appear intact. Tortuosity and calcific atherosclerotic disease of the aorta. There is no evidence of focal airspace consolidation, pleural effusion or pneumothorax. Osseous structures are without acute abnormality. Soft tissues are grossly normal. IMPRESSION: No active disease. Electronically Signed   By: Fidela Salisbury M.D.   On: 06/10/2020 11:15   CUP PACEART REMOTE DEVICE CHECK  Result Date: 05/22/2020 ILR summary report received. Battery status OK. Normal device function. No new symptom, tachy, brady, or pause episodes. No new AF episodes. Monthly summary reports and ROV/PRN.  R. Powers, CVRS  DG Hips Bilat W or Wo Pelvis 3-4 Views  Result Date: 06/10/2020 CLINICAL DATA:  Found on side of road, last seen normal at 2130 hours last night, fall, dementia, tenderness of hips bilaterally EXAM: DG HIP (WITH OR WITHOUT PELVIS) 3-4V BILAT COMPARISON:  None FINDINGS: Osseous demineralization. Hip and SI joint spaces preserved. No acute fracture, dislocation, or bone destruction. Mild degenerative disc disease changes at visualized lower lumbar spine. IMPRESSION: No acute osseous abnormalities. Electronically Signed   By: Lavonia Dana M.D.   On: 06/10/2020 11:48

## 2020-06-12 NOTE — Progress Notes (Addendum)
SLP Cancellation Note  Patient Details Name: Patrick Phillips MRN: [id number] DOB: [birth year]   Cancelled treatment:        Spoke with RN, who reports dramatic improvement in pt's mental status and good tolerance of diet.  She deferred evaluation at this time and will cancel orders.  If there is a change in pt's functional status, please reconsult speech therapy.   Celedonio Savage , MA, [place] Office: [phone]; Pager [phone]): [phone] 06/12/2020, 2:11 PM

## 2020-06-13 DIAGNOSIS — I10 Essential (primary) hypertension: Secondary | ICD-10-CM | POA: Diagnosis not present

## 2020-06-13 DIAGNOSIS — E119 Type 2 diabetes mellitus without complications: Secondary | ICD-10-CM | POA: Diagnosis not present

## 2020-06-13 DIAGNOSIS — F0151 Vascular dementia with behavioral disturbance: Secondary | ICD-10-CM | POA: Diagnosis not present

## 2020-06-13 LAB — CBC WITH DIFFERENTIAL/PLATELET
Abs Immature Granulocytes: 0.01 10*3/uL (ref 0.00–0.07)
Basophils Absolute: 0 10*3/uL (ref 0.0–0.1)
Basophils Relative: 0 %
Eosinophils Absolute: 0 10*3/uL (ref 0.0–0.5)
Eosinophils Relative: 0 %
HCT: 36.9 % — ABNORMAL LOW (ref 39.0–52.0)
Hemoglobin: 12.6 g/dL — ABNORMAL LOW (ref 13.0–17.0)
Immature Granulocytes: 0 %
Lymphocytes Relative: 17 %
Lymphs Abs: 0.9 10*3/uL (ref 0.7–4.0)
MCH: 28.7 pg (ref 26.0–34.0)
MCHC: 34.1 g/dL (ref 30.0–36.0)
MCV: 84.1 fL (ref 80.0–100.0)
Monocytes Absolute: 0.3 10*3/uL (ref 0.1–1.0)
Monocytes Relative: 6 %
Neutro Abs: 4 10*3/uL (ref 1.7–7.7)
Neutrophils Relative %: 77 %
Platelets: 165 10*3/uL (ref 150–400)
RBC: 4.39 MIL/uL (ref 4.22–5.81)
RDW: 13.8 % (ref 11.5–15.5)
WBC: 5.2 10*3/uL (ref 4.0–10.5)
nRBC: 0 % (ref 0.0–0.2)

## 2020-06-13 LAB — COMPREHENSIVE METABOLIC PANEL
ALT: 38 U/L (ref 0–44)
AST: 74 U/L — ABNORMAL HIGH (ref 15–41)
Albumin: 3.3 g/dL — ABNORMAL LOW (ref 3.5–5.0)
Alkaline Phosphatase: 40 U/L (ref 38–126)
Anion gap: 9 (ref 5–15)
BUN: 12 mg/dL (ref 8–23)
CO2: 21 mmol/L — ABNORMAL LOW (ref 22–32)
Calcium: 8.6 mg/dL — ABNORMAL LOW (ref 8.9–10.3)
Chloride: 108 mmol/L (ref 98–111)
Creatinine, Ser: 1.03 mg/dL (ref 0.61–1.24)
GFR, Estimated: >60 mL/min (ref >60)
Glucose, Bld: 108 mg/dL — ABNORMAL HIGH (ref 70–99)
Potassium: 3.2 mmol/L — ABNORMAL LOW (ref 3.5–5.1)
Sodium: 138 mmol/L (ref 135–145)
Total Bilirubin: 0.8 mg/dL (ref 0.3–1.2)
Total Protein: 6 g/dL — ABNORMAL LOW (ref 6.5–8.1)

## 2020-06-13 LAB — GLUCOSE, CAPILLARY
Glucose-Capillary: 103 mg/dL — ABNORMAL HIGH (ref 70–99)
Glucose-Capillary: 135 mg/dL — ABNORMAL HIGH (ref 70–99)
Glucose-Capillary: 107 mg/dL — ABNORMAL HIGH (ref 70–99)
Glucose-Capillary: 97 mg/dL (ref 70–99)

## 2020-06-13 LAB — HSV 1/2 PCR, CSF
HSV-1 DNA: NEGATIVE
HSV-2 DNA: NEGATIVE

## 2020-06-13 MED ORDER — LORAZEPAM 2 MG/ML IJ SOLN: 1.0000 mg | Freq: Four times a day (QID) | INTRAMUSCULAR | Status: DC | PRN

## 2020-06-13 MED ORDER — ASENAPINE MALEATE 5 MG SL SUBL
5.0000 mg | SUBLINGUAL_TABLET | Freq: Two times a day (BID) | SUBLINGUAL | Status: DC
  Administered 2020-06-14 – 2020-06-20 (×12): 5 mg via SUBLINGUAL
  Filled 2020-06-13 (×17): qty 1

## 2020-06-13 MED ORDER — HALOPERIDOL LACTATE 5 MG/ML IJ SOLN
5.0000 mg | Freq: Four times a day (QID) | INTRAMUSCULAR | Status: DC | PRN
  Administered 2020-06-13 – 2020-06-14 (×2): 5 mg via INTRAVENOUS
  Filled 2020-06-13 (×5): qty 1

## 2020-06-13 MED ORDER — PANTOPRAZOLE SODIUM 40 MG PO TBEC
40.0000 mg | DELAYED_RELEASE_TABLET | Freq: Every day | ORAL | Status: DC
  Administered 2020-06-14 – 2020-07-14 (×31): 40 mg via ORAL
  Filled 2020-06-13 (×31): qty 1

## 2020-06-13 MED ORDER — AMLODIPINE BESYLATE 5 MG PO TABS
5.0000 mg | ORAL_TABLET | ORAL | Status: AC
  Filled 2020-06-13: qty 1

## 2020-06-13 MED ORDER — DIVALPROEX SODIUM 125 MG PO CSDR
250.0000 mg | DELAYED_RELEASE_CAPSULE | Freq: Two times a day (BID) | ORAL | Status: DC
  Administered 2020-06-14 – 2020-06-18 (×9): 250 mg via ORAL
  Filled 2020-06-13 (×9): qty 2

## 2020-06-13 MED ORDER — ADULT MULTIVITAMIN W/MINERALS CH
1.0000 | ORAL_TABLET | Freq: Every day | ORAL | Status: DC
  Administered 2020-06-14 – 2020-07-14 (×31): 1 via ORAL
  Filled 2020-06-13 (×31): qty 1

## 2020-06-13 MED ORDER — ENSURE ENLIVE PO LIQD
237.0000 mL | Freq: Two times a day (BID) | ORAL | Status: DC
  Administered 2020-06-14 – 2020-07-14 (×33): 237 mL via ORAL

## 2020-06-13 MED ORDER — QUETIAPINE FUMARATE 50 MG PO TABS
50.0000 mg | ORAL_TABLET | Freq: Two times a day (BID) | ORAL | Status: DC
  Administered 2020-06-13: 50 mg via ORAL
  Filled 2020-06-13: qty 1

## 2020-06-13 MED ORDER — HYDRALAZINE HCL 20 MG/ML IJ SOLN: 10.0000 mg | Freq: Four times a day (QID) | INTRAMUSCULAR | Status: DC | PRN

## 2020-06-13 MED ORDER — POTASSIUM CHLORIDE CRYS ER 20 MEQ PO TBCR
40.0000 meq | EXTENDED_RELEASE_TABLET | Freq: Once | ORAL | Status: AC
  Administered 2020-06-13: 40 meq via ORAL
  Filled 2020-06-13: qty 2

## 2020-06-13 MED ORDER — AMLODIPINE BESYLATE 10 MG PO TABS: 10.0000 mg | ORAL_TABLET | Freq: Two times a day (BID) | ORAL | Status: DC

## 2020-06-13 MED ORDER — MELATONIN 5 MG PO TABS
5.0000 mg | ORAL_TABLET | Freq: Every day | ORAL | Status: DC
  Administered 2020-06-14 – 2020-06-22 (×9): 5 mg via ORAL
  Filled 2020-06-13 (×11): qty 1

## 2020-06-13 MED ORDER — AMLODIPINE BESYLATE 10 MG PO TABS
10.0000 mg | ORAL_TABLET | Freq: Every day | ORAL | Status: DC
  Administered 2020-06-14 – 2020-07-14 (×30): 10 mg via ORAL
  Filled 2020-06-13 (×32): qty 1

## 2020-06-13 MED ORDER — LORAZEPAM 2 MG/ML IJ SOLN
INTRAMUSCULAR | Status: AC
  Administered 2020-06-13: 2 mg via INTRAVENOUS
  Filled 2020-06-13: qty 1

## 2020-06-13 MED ORDER — CYANOCOBALAMIN 1000 MCG/ML IJ SOLN
1000.0000 ug | Freq: Every day | INTRAMUSCULAR | Status: DC
  Administered 2020-06-14 – 2020-06-19 (×6): 1000 ug via SUBCUTANEOUS
  Filled 2020-06-13 (×7): qty 1

## 2020-06-13 NOTE — Consult Note (Signed)
Reason for Consult: '' severe delirium-please assist in med management.'' Patient just got Ativan secondary to agitation and as a result, too sedated to participate in full psychiatric interview. However, his  chart is reviewed as well as the information obtained from the staff nurse and hospitalist revealed that patient  is a 80 y.o. male with PMHx of vascular dementia, prior CVA, DM-2, HTN-who was admitted with fever and confusion. Hospitalist report that fever has resolved but patient has been having episodic agitation/confusion-more of delirium in the setting of Dementia and the antipsychotic giving so far has not helped much.  Recommendations discussed with Hospitalist: -Discontinue Seroquel ans start patient on Saphris 5 mg S/L twice daily for Dementia with agitation. -Consider adding low dose Depakote sprinkle 250 mg twice daily for agitation. -Psychiatric service will re-evaluate patient tomorrow.  Corena Pilgrim, MD Attending Psychiatrist.

## 2020-06-13 NOTE — Progress Notes (Signed)
Initial Nutrition Assessment  DOCUMENTATION CODES:   Not applicable  INTERVENTION:   Ensure Enlive po BID, each supplement provides 350 kcal and 20 grams of protein  Magic cup BID with meals, each supplement provides 290 kcal and 9 grams of protein  Recommend liberalizing diet to regular  MVI with minerals daily  NUTRITION DIAGNOSIS:   Increased nutrient needs related to acute illness (COVID) as evidenced by estimated needs.  GOAL:   Patient will meet greater than or equal to 90% of their needs  MONITOR:   PO intake,Labs,Weight trends,I & O's  REASON FOR ASSESSMENT:   Malnutrition Screening Tool    ASSESSMENT:   Pt admitted with acute metabolic encephalopathy, likely 2/2 febrile illness/COVID-19 infection. PMH includes HTN, DM, Ca, dementia.  Pt unavailable at time of RD visit; having procedure/test done. Pt noted to be almost back to baseline mentation per MD. Per RN, pt has done well with meals so far. 100% x 1 documented meal.   Reviewed weight history. Per weight readings, pt weighed 94.8 kg on 01/13/20 and now weighs 86.2 kg. This indicates an 8.9% weight loss x 5 months, which is insignificant for timeframe.   No UOP documented.   Medications: SSI TID w/ meals, protonix Labs: K+ 3.2 (L) CBGs [phone]  Diet Order:   Diet Order            Diet regular Room service appropriate? Yes; Fluid consistency: Thin  Diet effective now                 EDUCATION NEEDS:   No education needs have been identified at this time  Skin:  Skin Assessment: Reviewed RN Assessment  Last BM:  4/16 type 7  Height:   Ht Readings from Last 1 Encounters:  06/10/20 6' (1.829 m)    Weight:   Wt Readings from Last 1 Encounters:  06/10/20 86.2 kg   BMI:  Body mass index is 25.77 kg/m.  Estimated Nutritional Needs:   Kcal:  1950-2150  Protein:  100-110 grams  Fluid:  >1.95L/d    Larkin Ina, MS, RD, LDN RD pager number and weekend/on-call pager  number located in [place].

## 2020-06-13 NOTE — Progress Notes (Signed)
PROGRESS NOTE                                                                                                                                                                                                             Patient Demographics:    Patrick Phillips, is a 80 y.o. male, DOB - Sep 23, 1940, AVW:[id number]  Outpatient Primary MD for the patient is [name], MD   Admit date - 06/10/2020   LOS - 2  Chief Complaint  Patient presents with  . Altered Mental Status       Brief Narrative: Patient is a 80 y.o. male with PMHx of vascular dementia, prior CVA, DM-2, HTN-who presented with fever and confusion.  COVID-19 vaccinated status: Unknown  Significant Events: 4/14>> Admit to [facility] for fever/confusion  Significant studies: 4/14>>Chest x-ray: No PNA 4/14>> CT head: No acute intracranial abnormality. 4/14>> CT C-spine: No fracture 4/14>> MRI brain: No acute infarct. 4/15>> chest x-ray: No PNA 4/15>> CSF: WBC 1 (tube #4), protein 46, glucose 54 4/16>> EEG: Mild to moderate diffuse encephalopathy-no seizures.  COVID-19 medications: Remdesivir: 4/15>>  Antibiotics: Vancomycin: 4/14 x1 Ceftriaxone: 4/14 x 1 Ampicillin: 4/14 x 1  Microbiology data: 4/14 >>blood culture: No growth 4/14>> urine culture: No growth 4/15>> blood culture: No growth 4/15>> urine culture: Multiple species 4/15>> CSF culture: No growth  Procedures: None  Consults: None  DVT prophylaxis: enoxaparin (LOVENOX) injection 40 mg Start: 06/11/20 0600    Subjective:   Had significant delirium yesterday evening-requiring restraints.  This morning relatively calm-quiet-following commands.  Still somewhat confused-no family at bedside.  Subsequently related this morning-started having agitation again.   Assessment  & Plan :   Acute metabolic encephalopathy-superimposed on dementia with delirium: Encephalopathy was likely related  to febrile illness/COVID-19 infection-all culture data negative so far.  Neuroimaging as above.  EEG negative for seizures.  No fever for the past 48 hours-extensive work-up above has been negative-suspect that initial encephalopathy has more or less resolved-and that episodic agitation/confusion is more of delirium in the setting of dementia.   Have increased Seroquel to 50 mg twice daily-we will use Haldol as needed for agitation.  We will try and sparingly use Ativan if possible.  I have placed psychiatric consultation to assist in management of severe delirium with agitation.  Addendum-discussed with psych MD-recommends we stop Seroquel-switch to  Saphris and Depakote.  Psych to formally evaluate-I have asked psychiatry to continue to follow for medication management.  SIRS-COVID-19 infection: Completed Remdesivir x3 days.  Not hypoxic-no pneumonia-does not require any further therapy.  Interestingly-patient apparently had COVID-19 infection in February as well.  Recent Labs    06/11/20 0445 06/12/20 0455  DDIMER 1.81* 1.64*  CRP 7.5* 7.0*    Lab Results  Component Value Date   SARSCOV2NAA POSITIVE (A) 06/11/2020   [place] NEGATIVE 01/13/2020    AKI: Mild-likely hemodynamically mediated-solved with IV fluids.  HTN: BP remains on the higher side-probably due to agitation-increase amlodipine to 10 mg-we will add as needed hydralazine.   DM-2: CBGs relatively stable-continue SSI-oral hypoglycemic agents on hold  Recent Labs    06/12/20 1542 06/12/20 1956 06/13/20 0747  GLUCAP 101* 113* 103*   History of CVA-MRI brain negative for infarct-on aspirin/statin.  Dementia: On Namenda  GERD: On PPI  Interventions: Ensure Enlive (each supplement provides 350kcal and 20 grams of protein),MVI GI prophylaxis: PPI  ABG:    Component Value Date/Time   TCO2 18 (L) 06/10/2020 1026    Vent Settings: N/A  Condition - Extremely Guarded  Family Communication  :   Daughter-Brenda-[phone] at bedside on 4/17  Code Status :  Full Code  Diet :  Diet Order            Diet regular Room service appropriate? Yes; Fluid consistency: Thin  Diet effective now                  Disposition Plan  :   Status is: Inpatient  Remains inpatient appropriate because:Inpatient level of care appropriate due to severity of illness   Dispo: The patient is from: Home              Anticipated d/c is to: Home              Patient currently is not medically stable to d/c.   Difficult to place patient No   Barriers to discharge: Resolving encephalopathy-awaiting final culture data.  Antimicorbials  :    Anti-infectives (From admission, onward)   Start     Dose/Rate Route Frequency Ordered Stop   06/12/20 1000  remdesivir 100 mg in sodium chloride 0.9 % 100 mL IVPB        100 mg 200 mL/hr over 30 Minutes Intravenous Daily 06/11/20 0435 06/13/20 0836   06/11/20 0445  remdesivir 200 mg in sodium chloride 0.9% 250 mL IVPB        200 mg 580 mL/hr over 30 Minutes Intravenous Once 06/11/20 0434 06/11/20 0929   06/11/20 0230  vancomycin (VANCOREADY) IVPB 1500 mg/300 mL        1,500 mg 150 mL/hr over 120 Minutes Intravenous  Once 06/11/20 0229 06/11/20 0812   06/11/20 0215  ampicillin (OMNIPEN) 2 g in sodium chloride 0.9 % 100 mL IVPB        2 g 300 mL/hr over 20 Minutes Intravenous  Once 06/11/20 0157 06/11/20 0550   06/11/20 0215  vancomycin (VANCOREADY) IVPB 1000 mg/200 mL  Status:  Discontinued        1,000 mg 200 mL/hr over 60 Minutes Intravenous  Once 06/11/20 0157 06/11/20 0229   06/11/20 0200  cefTRIAXone (ROCEPHIN) 2 g in sodium chloride 0.9 % 100 mL IVPB        2 g 200 mL/hr over 30 Minutes Intravenous  Once 06/11/20 0157 06/11/20 0248      Inpatient Medications  Scheduled  Meds: . amLODipine  5 mg Oral BID  . aspirin EC  81 mg Oral Daily  . atorvastatin  40 mg Oral Daily  . cyanocobalamin  1,000 mcg Subcutaneous Daily  . enoxaparin  (LOVENOX) injection  40 mg Subcutaneous Q24H  . feeding supplement  237 mL Oral BID BM  . insulin aspart  0-9 Units Subcutaneous TID WC  . melatonin  5 mg Oral QHS  . memantine  10 mg Oral BID  . multivitamin with minerals  1 tablet Oral Daily  . pantoprazole (PROTONIX) IV  40 mg Intravenous Q24H  . QUEtiapine  50 mg Oral BID   Continuous Infusions:  PRN Meds:.acetaminophen, haloperidol lactate, LORazepam, ondansetron **OR** ondansetron (ZOFRAN) IV   Time Spent in minutes  25    See all Orders from today for further details   Oren Binet M.D on 06/13/2020 at 11:39 AM  To page go to [email or web address] - use universal password  [facility] -  Office  [phone]    Objective:   Vitals:   06/12/20 1900 06/12/20 2347 06/13/20 0449 06/13/20 0742  BP: (!) 163/74 (!) 177/92 (!) 164/72 (!) 187/78  Pulse:  92 75 79  Resp: (!) 22 19 18 19   Temp: 98.7 F (37.1 C) 98.6 F (37 C) 99.4 F (37.4 C) 99.5 F (37.5 C)  TempSrc: Axillary Axillary Axillary Oral  SpO2: 92%  95% 95%  Weight:      Height:        Wt Readings from Last 3 Encounters:  06/10/20 86.2 kg  06/10/20 86.2 kg  04/19/20 86.2 kg     Intake/Output Summary (Last 24 hours) at 06/13/2020 1139 Last data filed at 06/13/2020 0806 Gross per 24 hour  Intake 220 ml  Output --  Net 220 ml     Physical Exam  Gen Exam: Was calm and quiet this morning-but again agitated HEENT:atraumatic, normocephalic Chest: B/L clear to auscultation anteriorly CVS:S1S2 regular Abdomen:soft non tender, non distended Extremities:no edema Neurology: Moves all 4 extremities Skin: no rash   Data Review:    CBC Recent Labs  Lab 06/10/20 1026 06/11/20 0100 06/12/20 0455 06/13/20 0449  WBC  --  8.4 6.1 5.2  HGB 15.3 13.1 12.1* 12.6*  HCT 45.0 40.9 37.6* 36.9*  PLT  --  211 169 165  MCV  --  88.5 86.8 84.1  MCH  --  28.4 27.9 28.7  MCHC  --  32.0 32.2 34.1  RDW  --  14.6 14.6 13.8  LYMPHSABS  --  0.7 1.5 0.9   MONOABS  --  0.9 0.5 0.3  EOSABS  --  0.0 0.1 0.0  BASOSABS  --  0.0 0.0 0.0    Chemistries  Recent Labs  Lab 06/10/20 1026 06/10/20 1055 06/11/20 0100 06/12/20 0455 06/13/20 0449  NA 140 136 135 139 138  K 4.5 4.4 3.5 3.7 3.2*  CL 109 107 109 112* 108  CO2  --  16* 19* 23 21*  GLUCOSE 287* 297* 89 95 108*  BUN 47* 50* 43* 20 12  CREATININE 1.50* 1.53* 1.49* 1.24 1.03  CALCIUM  --  8.7* 8.5* 8.4* 8.6*  AST  --  29 27 40 74*  ALT  --  30 26 27  38  ALKPHOS  --  51 42 36* 40  BILITOT  --  0.6 0.8 0.8 0.8   ------------------------------------------------------------------------------------------------------------------ No results for input(s): CHOL, HDL, LDLCALC, TRIG, CHOLHDL, LDLDIRECT in the last 72 hours.  Lab Results  Component Value  Date   HGBA1C 6.6 (H) 06/11/2020   ------------------------------------------------------------------------------------------------------------------ No results for input(s): TSH, T4TOTAL, T3FREE, THYROIDAB in the last 72 hours.  Invalid input(s): FREET3 ------------------------------------------------------------------------------------------------------------------ Recent Labs    06/12/20 0455  VITAMINB12 219    Coagulation profile Recent Labs  Lab 06/11/20 0100  INR 1.1    Recent Labs    06/11/20 0445 06/12/20 0455  DDIMER 1.81* 1.64*    Cardiac Enzymes No results for input(s): CKMB, TROPONINI, MYOGLOBIN in the last 168 hours.  Invalid input(s): CK ------------------------------------------------------------------------------------------------------------------    Component Value Date/Time   BNP 25.0 10/10/2017 2103    Micro Results Recent Results (from the past 240 hour(s))  Culture, blood (Routine X 2) w Reflex to ID Panel     Status: None (Preliminary result)   Collection Time: 06/10/20 11:32 AM   Specimen: BLOOD  Result Value Ref Range Status   Specimen Description BLOOD RIGHT ANTECUBITAL  Final    Special Requests   Final    Blood Culture adequate volume BOTTLES DRAWN AEROBIC AND ANAEROBIC   Culture   Final    NO GROWTH 3 DAYS Performed at [facility], [address], [place], [place] [address]    Report Status PENDING  Incomplete  Culture, blood (Routine X 2) w Reflex to ID Panel     Status: None (Preliminary result)   Collection Time: 06/10/20 11:33 AM   Specimen: BLOOD  Result Value Ref Range Status   Specimen Description BLOOD BLOOD RIGHT WRIST  Final   Special Requests   Final    Blood Culture adequate volume BOTTLES DRAWN AEROBIC AND ANAEROBIC   Culture   Final    NO GROWTH 3 DAYS Performed at [facility], [address] [place], [place] [address]    Report Status PENDING  Incomplete  Urine Culture     Status: None   Collection Time: 06/10/20  1:05 PM   Specimen: Urine, Clean Catch  Result Value Ref Range Status   Specimen Description   Final    URINE, CLEAN CATCH Performed at [facility], [address], [place], [place] [address]    Special Requests   Final    NONE Performed at [facility], [address], [place], [place] [address]    Culture   Final    NO GROWTH Performed at [facility], [place] [address] [place], [place] [address]    Report Status 06/11/2020 FINAL  Final  Blood Culture (routine x 2)     Status: None (Preliminary result)   Collection Time: 06/11/20  1:00 AM   Specimen: BLOOD LEFT ARM  Result Value Ref Range Status   Specimen Description BLOOD LEFT ARM  Final   Special Requests   Final    BOTTLES DRAWN AEROBIC AND ANAEROBIC Blood Culture adequate volume   Culture   Final    NO GROWTH 1 DAY Performed at [facility], [place] [address], [place], [place] [address]    Report Status PENDING  Incomplete  Urine culture     Status: Abnormal   Collection Time: 06/11/20  1:49 AM   Specimen: In/Out Cath Urine  Result Value Ref Range Status   Specimen Description IN/OUT CATH URINE  Final   Special Requests   Final    NONE Performed  at [facility], [place] [address], [place], [place] [address]    Culture MULTIPLE SPECIES PRESENT, SUGGEST RECOLLECTION (A)  Final   Report Status 06/12/2020 FINAL  Final  Blood Culture (  routine x 2)     Status: None (Preliminary result)   Collection Time: 06/11/20  2:00 AM   Specimen: BLOOD RIGHT ARM  Result Value Ref Range Status   Specimen Description BLOOD RIGHT ARM  Final   Special Requests   Final    BOTTLES DRAWN AEROBIC AND ANAEROBIC Blood Culture adequate volume   Culture   Final    NO GROWTH 1 DAY Performed at [facility], [place] [address], [place], [place] [address]    Report Status PENDING  Incomplete  Resp Panel by RT-PCR (Flu A&B, Covid) Nasopharyngeal Swab     Status: Abnormal   Collection Time: 06/11/20  2:29 AM   Specimen: Nasopharyngeal Swab; Nasopharyngeal(NP) swabs in vial transport medium  Result Value Ref Range Status   SARS Coronavirus 2 by RT PCR POSITIVE (A) NEGATIVE Final    Comment: RESULT CALLED TO, READ BACK BY AND VERIFIED WITH: K MOON RN 06/11/20 0411 JDW (NOTE) SARS-CoV-2 target nucleic acids are DETECTED.  The SARS-CoV-2 RNA is generally detectable in upper respiratory specimens during the acute phase of infection. Positive results are indicative of the presence of the identified virus, but do not rule out bacterial infection or co-infection with other pathogens not detected by the test. Clinical correlation with patient history and other diagnostic information is necessary to determine patient infection status. The expected result is Negative.  Fact Sheet for Patients: EntrepreneurPulse.com.au  Fact Sheet for Healthcare Providers: IncredibleEmployment.be  This test is not yet approved or cleared by the [place] FDA and  has been authorized for detection and/or diagnosis of SARS-CoV-2 by FDA under an Emergency Use Authorization (EUA).  This EUA will remain in effect (meaning this test can be  used)  for the duration of  the COVID-19 declaration under Section 564(b)(1) of the Act, 21 U.S.C. section 360bbb-3(b)(1), unless the authorization is terminated or revoked sooner.     Influenza A by PCR NEGATIVE NEGATIVE Final   Influenza B by PCR NEGATIVE NEGATIVE Final    Comment: (NOTE) The Xpert Xpress SARS-CoV-2/FLU/RSV plus assay is intended as an aid in the diagnosis of influenza from Nasopharyngeal swab specimens and should not be used as a sole basis for treatment. Nasal washings and aspirates are unacceptable for Xpert Xpress SARS-CoV-2/FLU/RSV testing.  Fact Sheet for Patients: EntrepreneurPulse.com.au  Fact Sheet for Healthcare Providers: IncredibleEmployment.be  This test is not yet approved or cleared by the [place] FDA and has been authorized for detection and/or diagnosis of SARS-CoV-2 by FDA under an Emergency Use Authorization (EUA). This EUA will remain in effect (meaning this test can be used) for the duration of the COVID-19 declaration under Section 564(b)(1) of the Act, 21 U.S.C. section 360bbb-3(b)(1), unless the authorization is terminated or revoked.  Performed at [facility], [place] [address], [place], [place] [address]   CSF culture     Status: None (Preliminary result)   Collection Time: 06/11/20  5:20 AM   Specimen: CSF; Cerebrospinal Fluid  Result Value Ref Range Status   Specimen Description CSF  Final   Special Requests NONE  Final   Gram Stain NO WBC SEEN NO ORGANISMS SEEN CYTOSPIN SMEAR   Final   Culture   Final    NO GROWTH 1 DAY Performed at [facility], [place] [address] [place], [place] [address]    Report Status PENDING  Incomplete    Radiology Reports CT Head Wo Contrast  Result Date: 06/10/2020 CLINICAL DATA:  Head and  C-spine injury, fall, altered mental status EXAM: CT HEAD WITHOUT CONTRAST CT CERVICAL SPINE WITHOUT CONTRAST TECHNIQUE: Multidetector CT imaging of the  head and cervical spine was performed following the standard protocol without intravenous contrast. Multiplanar CT image reconstructions of the cervical spine were also generated. COMPARISON:  05/04/2020 FINDINGS: CT HEAD FINDINGS Brain: Stable mild brain atrophy pattern. No acute intracranial hemorrhage, new mass lesion, acute infarction, midline shift, herniation, hydrocephalus, or extra-axial fluid collection. No focal mass effect or edema. Cisterns are patent. Cerebellar atrophy as well. Vascular: Intracranial atherosclerosis at the skull base. No hyperdense vessel. Skull: Normal. Negative for fracture or focal lesion. Sinuses/Orbits: Minor scattered sinus mucosal thickening as before. Orbits are symmetric. No acute finding. Other: None. CT CERVICAL SPINE FINDINGS Alignment: Normal. Skull base and vertebrae: No acute fracture. No primary bone lesion or focal pathologic process. Soft tissues and spinal canal: Normal prevertebral soft tissues. No hemorrhage or hematoma appreciated. Carotid atherosclerosis. Disc levels: Similar mild multilevel degenerative disc disease and facet arthropathy. Facets are aligned. No subluxation or dislocation. Preserved vertebral body heights. Upper chest: Negative. Other: None. IMPRESSION: Stable brain atrophy pattern. No acute intracranial abnormality or interval change by noncontrast CT. Stable mild cervical degenerative changes as above. No acute osseous finding or malalignment by CT. Electronically Signed   By: Jerilynn Mages.  Shick M.D.   On: 06/10/2020 12:31   CT Cervical Spine Wo Contrast  Result Date: 06/10/2020 CLINICAL DATA:  Head and C-spine injury, fall, altered mental status EXAM: CT HEAD WITHOUT CONTRAST CT CERVICAL SPINE WITHOUT CONTRAST TECHNIQUE: Multidetector CT imaging of the head and cervical spine was performed following the standard protocol without intravenous contrast. Multiplanar CT image reconstructions of the cervical spine were also generated. COMPARISON:   05/04/2020 FINDINGS: CT HEAD FINDINGS Brain: Stable mild brain atrophy pattern. No acute intracranial hemorrhage, new mass lesion, acute infarction, midline shift, herniation, hydrocephalus, or extra-axial fluid collection. No focal mass effect or edema. Cisterns are patent. Cerebellar atrophy as well. Vascular: Intracranial atherosclerosis at the skull base. No hyperdense vessel. Skull: Normal. Negative for fracture or focal lesion. Sinuses/Orbits: Minor scattered sinus mucosal thickening as before. Orbits are symmetric. No acute finding. Other: None. CT CERVICAL SPINE FINDINGS Alignment: Normal. Skull base and vertebrae: No acute fracture. No primary bone lesion or focal pathologic process. Soft tissues and spinal canal: Normal prevertebral soft tissues. No hemorrhage or hematoma appreciated. Carotid atherosclerosis. Disc levels: Similar mild multilevel degenerative disc disease and facet arthropathy. Facets are aligned. No subluxation or dislocation. Preserved vertebral body heights. Upper chest: Negative. Other: None. IMPRESSION: Stable brain atrophy pattern. No acute intracranial abnormality or interval change by noncontrast CT. Stable mild cervical degenerative changes as above. No acute osseous finding or malalignment by CT. Electronically Signed   By: Jerilynn Mages.  Shick M.D.   On: 06/10/2020 12:31   MR Brain Wo Contrast (neuro protocol)  Result Date: 06/10/2020 CLINICAL DATA:  Acute neuro deficit.  Mental status change. EXAM: MRI HEAD WITHOUT CONTRAST TECHNIQUE: Multiplanar, multiecho pulse sequences of the brain and surrounding structures were obtained without intravenous contrast. COMPARISON:  CT head 06/10/2020 FINDINGS: Brain: Ventricle size and cerebral volume normal for age. Negative for acute infarct. No significant chronic ischemic change. Negative for hemorrhage or mass. Image quality degraded by mild motion. Vascular: Partial loss of flow void in the left internal carotid artery through the skull base  and the distal left vertebral artery which may be due to slow flow or stenosis. Skull and upper cervical spine: Negative Sinuses/Orbits: Mild mucosal edema  paranasal sinuses. No orbital mass. Bilateral exophthalmos. Other: None IMPRESSION: Negative for acute infarct. Partial loss of flow void in the left internal carotid artery distal left vertebral artery which could be due to proximal stenosis. Consider CT angio head and neck for further evaluation. Electronically Signed   By: Franchot Gallo M.D.   On: 06/10/2020 15:09   DG Chest Port 1 View  Result Date: 06/11/2020 CLINICAL DATA:  Questionable sepsis. EXAM: PORTABLE CHEST 1 VIEW COMPARISON:  June 10, 2020 FINDINGS: There is no evidence of acute infiltrate, pleural effusion or pneumothorax. The heart size and mediastinal contours are within normal limits. A radiopaque loop recorder device is seen. The visualized skeletal structures are unremarkable. IMPRESSION: No active disease. Electronically Signed   By: Virgina Norfolk M.D.   On: 06/11/2020 03:10   DG Chest Port 1 View  Result Date: 06/10/2020 CLINICAL DATA:  Altered mental status. EXAM: PORTABLE CHEST 1 VIEW COMPARISON:  May 25, 2020 FINDINGS: Loop recorder in place. Cardiomediastinal silhouette is normal. Mediastinal contours appear intact. Tortuosity and calcific atherosclerotic disease of the aorta. There is no evidence of focal airspace consolidation, pleural effusion or pneumothorax. Osseous structures are without acute abnormality. Soft tissues are grossly normal. IMPRESSION: No active disease. Electronically Signed   By: Fidela Salisbury M.D.   On: 06/10/2020 11:15   EEG adult  Result Date: 06/12/2020 Lora Havens, MD     06/12/2020  3:07 PM Patient Name: Rithvik Orcutt MRN: [id number] Epilepsy Attending: Lora Havens Referring Physician/Provider: Dr Oren Binet Date: 06/12/2020 Duration: 25.42 mins Patient history: 80yo M with ams. EEG to evaluate for seizure Level of  alertness: Awake AEDs during EEG study: None Technical aspects: This EEG study was done with scalp electrodes positioned according to the 10-20 International system of electrode placement. Electrical activity was acquired at a sampling rate of 500Hz  and reviewed with a high frequency filter of 70Hz  and a low frequency filter of 1Hz . EEG data were recorded continuously and digitally stored. Description: No posterior dominant rhythm was seen. EEG showed continuous generalized 5 to 6 Hz theta slowing. Hyperventilation and photic stimulation were not performed.   ABNORMALITY - Continuous slow, generalized IMPRESSION: This study is suggestive of mild to moderate diffuse encephalopathy, nonspecific etiology. No seizures or epileptiform discharges were seen throughout the recording. Lora Havens   CUP PACEART REMOTE DEVICE CHECK  Result Date: 05/22/2020 ILR summary report received. Battery status OK. Normal device function. No new symptom, tachy, brady, or pause episodes. No new AF episodes. Monthly summary reports and ROV/PRN.  R. Powers, CVRS  DG Hips Bilat W or Wo Pelvis 3-4 Views  Result Date: 06/10/2020 CLINICAL DATA:  Found on side of road, last seen normal at 2130 hours last night, fall, dementia, tenderness of hips bilaterally EXAM: DG HIP (WITH OR WITHOUT PELVIS) 3-4V BILAT COMPARISON:  None FINDINGS: Osseous demineralization. Hip and SI joint spaces preserved. No acute fracture, dislocation, or bone destruction. Mild degenerative disc disease changes at visualized lower lumbar spine. IMPRESSION: No acute osseous abnormalities. Electronically Signed   By: Lavonia Dana M.D.   On: 06/10/2020 11:48

## 2020-06-14 DIAGNOSIS — I10 Essential (primary) hypertension: Secondary | ICD-10-CM | POA: Diagnosis not present

## 2020-06-14 DIAGNOSIS — E119 Type 2 diabetes mellitus without complications: Secondary | ICD-10-CM | POA: Diagnosis not present

## 2020-06-14 DIAGNOSIS — F0151 Vascular dementia with behavioral disturbance: Secondary | ICD-10-CM | POA: Diagnosis not present

## 2020-06-14 LAB — CBC WITH DIFFERENTIAL/PLATELET
Abs Immature Granulocytes: 0.01 10*3/uL (ref 0.00–0.07)
Basophils Absolute: 0 10*3/uL (ref 0.0–0.1)
Basophils Relative: 0 %
Eosinophils Absolute: 0.1 10*3/uL (ref 0.0–0.5)
Eosinophils Relative: 1 %
HCT: 36.3 % — ABNORMAL LOW (ref 39.0–52.0)
Hemoglobin: 12 g/dL — ABNORMAL LOW (ref 13.0–17.0)
Immature Granulocytes: 0 %
Lymphocytes Relative: 27 %
Lymphs Abs: 1.5 10*3/uL (ref 0.7–4.0)
MCH: 28.6 pg (ref 26.0–34.0)
MCHC: 33.1 g/dL (ref 30.0–36.0)
MCV: 86.4 fL (ref 80.0–100.0)
Monocytes Absolute: 0.4 10*3/uL (ref 0.1–1.0)
Monocytes Relative: 8 %
Neutro Abs: 3.5 10*3/uL (ref 1.7–7.7)
Neutrophils Relative %: 64 %
Platelets: 164 10*3/uL (ref 150–400)
RBC: 4.2 MIL/uL — ABNORMAL LOW (ref 4.22–5.81)
RDW: 14.1 % (ref 11.5–15.5)
WBC: 5.6 10*3/uL (ref 4.0–10.5)
nRBC: 0 % (ref 0.0–0.2)

## 2020-06-14 LAB — COMPREHENSIVE METABOLIC PANEL
ALT: 34 U/L (ref 0–44)
AST: 61 U/L — ABNORMAL HIGH (ref 15–41)
Albumin: 3 g/dL — ABNORMAL LOW (ref 3.5–5.0)
Alkaline Phosphatase: 39 U/L (ref 38–126)
Anion gap: 10 (ref 5–15)
BUN: 17 mg/dL (ref 8–23)
CO2: 22 mmol/L (ref 22–32)
Calcium: 8.7 mg/dL — ABNORMAL LOW (ref 8.9–10.3)
Chloride: 109 mmol/L (ref 98–111)
Creatinine, Ser: 1.05 mg/dL (ref 0.61–1.24)
GFR, Estimated: >60 mL/min (ref >60)
Glucose, Bld: 108 mg/dL — ABNORMAL HIGH (ref 70–99)
Potassium: 3.3 mmol/L — ABNORMAL LOW (ref 3.5–5.1)
Sodium: 141 mmol/L (ref 135–145)
Total Bilirubin: 0.7 mg/dL (ref 0.3–1.2)
Total Protein: 5.4 g/dL — ABNORMAL LOW (ref 6.5–8.1)

## 2020-06-14 LAB — HSV CULTURE AND TYPING

## 2020-06-14 LAB — CSF CULTURE W GRAM STAIN
Culture: NO GROWTH
Gram Stain: NONE SEEN

## 2020-06-14 LAB — GLUCOSE, CAPILLARY
Glucose-Capillary: 90 mg/dL (ref 70–99)
Glucose-Capillary: 164 mg/dL — ABNORMAL HIGH (ref 70–99)
Glucose-Capillary: 116 mg/dL — ABNORMAL HIGH (ref 70–99)
Glucose-Capillary: 186 mg/dL — ABNORMAL HIGH (ref 70–99)

## 2020-06-14 LAB — VDRL, CSF: VDRL Quant, CSF: NONREACTIVE

## 2020-06-14 MED ORDER — POTASSIUM CHLORIDE CRYS ER 20 MEQ PO TBCR
40.0000 meq | EXTENDED_RELEASE_TABLET | Freq: Once | ORAL | Status: AC
  Administered 2020-06-14: 40 meq via ORAL
  Filled 2020-06-14: qty 2

## 2020-06-14 NOTE — Evaluation (Signed)
Occupational Therapy Evaluation Patient Details Name: Patrick Phillips MRN: [id number] DOB: Apr 23, 1940 Today's Date: 06/14/2020    History of Present Illness 80 y.o. male with presented with fever and confusion. Pt with dementia however still highly functional, took dog for a walk and dog came back without him. Pt later found lying on the side of road with no memory of what had happened but able to follow commands, taken to [facility] where MRI was negative for stroke and x-ray negative for fx. D/c home with daughter but pt with increasing confusion pt then brought  to [facility] ED 06/10/20. Pt with increasing confusion and agitation. Pt found to be febrile and COVID+. Admitted for acute metabolic encephalopathy.    PMHx of vascular dementia, prior CVA, DM-2, HTN   Clinical Impression   Pt presents with decline in function and safety with ADLs and ADL mobility with impaired strength, balance and endurance. Pt fatigues easily this afternoon and his daughter present, but talking over him and answering questions for him. PTA pt lived at home alone INd with selfcare but with family checking on him often; family assists with home mgt and meals. Pt currently requires min A to sit EOB, min guard A with UB  ADLs, grooming, mod A with LB ADLs and min A with mobility/transfers HHA. Pt is currently limited in safe mobility by poor cognition, poor safety awareness. Pt would benefit from acute OT services to address impairments to maximize level of function and safety  Follow Up Recommendations  Home health OT;Supervision/Assistance - 24 hour    Equipment Recommendations  Tub/shower seat    Recommendations for Other Services       Precautions / Restrictions Precautions Precautions: Fall Precaution Comments: found down along side of road Restrictions Weight Bearing Restrictions: No      Mobility Bed Mobility Overal bed mobility: Needs Assistance Bed Mobility: Supine to Sit;Sit to Supine     Supine to sit: Min  assist Sit to supine: Min assist   General bed mobility comments: min A with trunk, LE mgt, cues for sequencing    Transfers Overall transfer level: Needs assistance Equipment used: None Transfers: Sit to/from Stand Sit to Stand: Min assist         General transfer comment: min A for safety, requires increased cuing for coming all the way to upright.    Balance Overall balance assessment: Needs assistance Sitting-balance support: Feet supported;No upper extremity supported Sitting balance-Leahy Scale: Fair     Standing balance support: No upper extremity supported;Bilateral upper extremity supported;During functional activity Standing balance-Leahy Scale: Poor Standing balance comment: requires outside assist to maintain balance when not using UE support                           ADL either performed or assessed with clinical judgement   ADL Overall ADL's : Needs assistance/impaired Eating/Feeding: Set up;Supervision/ safety;Sitting   Grooming: Wash/dry hands;Wash/dry face;Min guard;Standing   Upper Body Bathing: Min guard;Sitting   Lower Body Bathing: Moderate assistance   Upper Body Dressing : Min guard;Sitting   Lower Body Dressing: Moderate assistance Lower Body Dressing Details (indicate cue type and reason): mod A to don socks seated EOB     Toileting- Clothing Manipulation and Hygiene: Moderate assistance;Sit to/from stand       Functional mobility during ADLs: Minimal assistance;Cueing for safety;Cueing for sequencing       Vision Patient Visual Report: No change from baseline  Perception     Praxis      Pertinent Vitals/Pain Pain Assessment: Faces Faces Pain Scale: Hurts little more Pain Location: L shoulder with movement, possibly due to fall Pain Descriptors / Indicators: Aching;Sore Pain Intervention(s): Monitored during session;Repositioned     Hand Dominance Right   Extremity/Trunk Assessment Upper Extremity  Assessment Upper Extremity Assessment: Generalized weakness   Lower Extremity Assessment Lower Extremity Assessment: Defer to PT evaluation       Communication Communication Communication: No difficulties   Cognition Arousal/Alertness: Lethargic Behavior During Therapy: WFL for tasks assessed/performed Overall Cognitive Status: History of cognitive impairments - at baseline Area of Impairment: Orientation;Attention;Memory;Following commands;Safety/judgement;Awareness;Problem solving                 Orientation Level: Disoriented to;Time;Place;Situation Current Attention Level: Selective Memory: Decreased short-term memory Following Commands: Follows multi-step commands with increased time;Follows one step commands consistently Safety/Judgement: Decreased awareness of deficits Awareness: Emergent Problem Solving: Requires verbal cues;Requires tactile cues General Comments: difficult to determine state of mind this afternoon as his daughter compensates/talks over him which causes him increased confustion   General Comments  Daughter present throughouts session, lamenting the fact that he has declined so rapidly in short amount of time, VSS on RA    Exercises Exercises: Total Joint Total Joint Exercises Hip ABduction/ADduction: AROM;Both;10 reps;Standing Marching in Standing: AROM;Both;10 reps;Standing   Shoulder Instructions      Home Living Family/patient expects to be discharged to:: Private residence Living Arrangements: Alone Available Help at Discharge: Family;Available 24 hours/day Type of Home: Mobile home Home Access: Stairs to enter Entrance Stairs-Number of Steps: 3   Home Layout: One level     Bathroom Shower/Tub: Teacher, early years/pre: Standard     Home Equipment: None          Prior Functioning/Environment Level of Independence: Needs assistance  Gait / Transfers Assistance Needed: independent with community level  ambulation ADL's / Homemaking Assistance Needed: does a wash up regularly, needed to be reminded to take showers once a week by family, daughter manages medication and bills, family provides dinner nightly   Comments: pt has been driving and stacking fire wood within the last week        OT Problem List: Decreased strength;Impaired balance (sitting and/or standing);Decreased cognition;Pain;Decreased safety awareness;Decreased activity tolerance;Decreased coordination;Decreased knowledge of use of DME or AE      OT Treatment/Interventions: Self-care/ADL training;Therapeutic exercise;Patient/family education;Balance training;Therapeutic activities;DME and/or AE instruction    OT Goals(Current goals can be found in the care plan section) Acute Rehab OT Goals Patient Stated Goal: go home to [facility] his dog OT Goal Formulation: With patient/family Time For Goal Achievement: 06/28/20 Potential to Achieve Goals: Good ADL Goals Pt Will Perform Grooming: with supervision;with set-up;standing;with caregiver independent in assisting Pt Will Perform Upper Body Bathing: with supervision;with set-up;sitting;with caregiver independent in assisting Pt Will Perform Lower Body Bathing: with min assist;with min guard assist;with caregiver independent in assisting;sit to/from stand Pt Will Perform Upper Body Dressing: with supervision;with set-up;sitting;with caregiver independent in assisting Pt Will Perform Lower Body Dressing: with min assist;with min guard assist;sit to/from stand;with caregiver independent in assisting Pt Will Transfer to Toilet: with supervision;ambulating Pt Will Perform Toileting - Clothing Manipulation and hygiene: with min guard assist;with supervision;with caregiver independent in assisting;sit to/from stand Pt Will Perform Tub/Shower Transfer: with min guard assist;with supervision;ambulating;with caregiver independent in assisting;shower seat  OT Frequency: Min 2X/week   Barriers  to D/C:  Co-evaluation              AM-PAC OT "6 Clicks" Daily Activity     Outcome Measure Help from another person eating meals?: None Help from another person taking care of personal grooming?: A Little Help from another person toileting, which includes using toliet, bedpan, or urinal?: A Lot Help from another person bathing (including washing, rinsing, drying)?: A Lot Help from another person to put on and taking off regular upper body clothing?: A Little Help from another person to put on and taking off regular lower body clothing?: A Lot 6 Click Score: 16   End of Session Equipment Utilized During Treatment: Gait belt  Activity Tolerance: Patient limited by fatigue Patient left: in bed;with call bell/phone within reach;with bed alarm set;with family/visitor present  OT Visit Diagnosis: Unsteadiness on feet (R26.81);Other abnormalities of gait and mobility (R26.89);History of falling (Z91.81);Muscle weakness (generalized) (M62.81);Other symptoms and signs involving cognitive function;Pain Pain - Right/Left: Left Pain - part of body: Shoulder                Time: 1594-7076 OT Time Calculation (min): 16 min Charges:  OT General Charges $OT Visit: 1 Visit OT Evaluation $OT Eval Moderate Complexity: 1 Mod   [name] 06/14/2020, 4:55 PM

## 2020-06-14 NOTE — Progress Notes (Signed)
Physical Therapy Treatment Patient Details Name: Patrick Phillips MRN: [id number] DOB: September 15, 1940 Today's Date: 06/14/2020    History of Present Illness 80 y.o. male with presented with fever and confusion. Pt with dementia however still highly functional, took dog for a walk and dog came back without him. Pt later found lying on the side of road with no memory of what had happened but able to follow commands, taken to [facility] where MRI was negative for stroke and x-ray negative for fx. D/c home with daughter but pt with increasing confusion pt then brought  to [facility] ED 06/10/20. Pt with increasing confusion and agitation. Pt found to be febrile and COVID+. Admitted for acute metabolic encephalopathy.    PMHx of vascular dementia, prior CVA, DM-2, HTN    PT Comments    Pt sitting up in recliner on entry, with daughter in room. Pt with limited response to questions, daughter quick to answer, so difficult to assess processing. Requires increased time for simple command follow in comparison to 2 days ago. Pt is min A for transfers from recliner and for very short ambulation in room. Stopped at sink to be able to support himself for washing his face and perform standing exercise. Given pt's increased need for assist PT changing recommendation to HHPT with 24 hour supervision. PT will continue to follow acutely.     Follow Up Recommendations  Home health PT;Supervision/Assistance - 24 hour     Equipment Recommendations  None recommended by PT       Precautions / Restrictions Precautions Precautions: Fall Precaution Comments: found down along side of road Restrictions Weight Bearing Restrictions: No    Mobility  Bed Mobility               General bed mobility comments: OOB in recliner    Transfers Overall transfer level: Needs assistance Equipment used: None Transfers: Sit to/from Stand Sit to Stand: Min assist         General transfer comment: min A for safety, requires  increased cuing for coming all the way to upright.  Ambulation/Gait Ambulation/Gait assistance: Min assist Gait Distance (Feet): 16 Feet Assistive device: None Gait Pattern/deviations: Step-through pattern;Decreased step length - right;Decreased step length - left;Trunk flexed Gait velocity: slowed Gait velocity interpretation: <1.31 ft/sec, indicative of household ambulator General Gait Details: min A for steadying, does not come to full knee extension in stance causing decreased balance, requires multimodal cuing to progress to ambulate to sink to stand with support       Balance Overall balance assessment: Needs assistance Sitting-balance support: Feet supported;No upper extremity supported Sitting balance-Leahy Scale: Fair     Standing balance support: No upper extremity supported;Bilateral upper extremity supported;During functional activity Standing balance-Leahy Scale: Poor Standing balance comment: requires outside assist to maintain balance when not using UE support                            Cognition Arousal/Alertness: Awake/alert Behavior During Therapy: WFL for tasks assessed/performed Overall Cognitive Status: History of cognitive impairments - at baseline Area of Impairment: Orientation;Attention;Memory;Following commands;Safety/judgement;Awareness;Problem solving                 Orientation Level: Disoriented to;Time;Place;Situation Current Attention Level: Selective Memory: Decreased short-term memory Following Commands: Follows multi-step commands with increased time;Follows one step commands consistently Safety/Judgement: Decreased awareness of deficits Awareness: Emergent Problem Solving: Requires verbal cues;Requires tactile cues General Comments: difficult to determine state of mind this  afternoon as his daughter compensates/talks over him which causes him increased confustion      Exercises Total Joint Exercises Hip  ABduction/ADduction: AROM;Both;10 reps;Standing Marching in Standing: AROM;Both;10 reps;Standing    General Comments General comments (skin integrity, edema, etc.): Daughter present throughouts session, lamenting the fact that he has declined so rapidly in short amount of time, VSS on RA      Pertinent Vitals/Pain Faces Pain Scale: Hurts little more Pain Location: L shoulder with movement, possibly due to fall Pain Descriptors / Indicators: Aching;Sore           PT Goals (current goals can now be found in the care plan section) Acute Rehab PT Goals Patient Stated Goal: go home to [place] his dog PT Goal Formulation: With patient/family Time For Goal Achievement: 06/26/20 Potential to Achieve Goals: Good Progress towards PT goals: Not progressing toward goals - comment (requires more assist today)    Frequency    Min 3X/week      PT Plan Discharge plan needs to be updated       AM-PAC PT "6 Clicks" Mobility   Outcome Measure  Help needed turning from your back to your side while in a flat bed without using bedrails?: None Help needed moving from lying on your back to sitting on the side of a flat bed without using bedrails?: None Help needed moving to and from a bed to a chair (including a wheelchair)?: A Little Help needed standing up from a chair using your arms (e.g., wheelchair or bedside chair)?: A Little Help needed to walk in hospital room?: A Little Help needed climbing 3-5 steps with a railing? : A Little 6 Click Score: 20    End of Session   Activity Tolerance: Patient limited by fatigue Patient left: with call bell/phone within reach;with family/visitor present;in bed;with bed alarm set Nurse Communication: Mobility status PT Visit Diagnosis: Unsteadiness on feet (R26.81);Other abnormalities of gait and mobility (R26.89);Muscle weakness (generalized) (M62.81);Difficulty in walking, not elsewhere classified (R26.2)     Time: 4158-3094 PT Time Calculation  (min) (ACUTE ONLY): 17 min  Charges:  $Therapeutic Exercise: 8-22 mins                     Patrick Phillips B. Migdalia Dk PT, DPT Acute Rehabilitation Services Pager [phone] Office [phone]    [place] 06/14/2020, 3:15 PM

## 2020-06-14 NOTE — Progress Notes (Addendum)
On assessment, Pt somnolent responding to only pain and vigorous stimuli.  Pt will open his eyes and say one or two words but cannot maintain alertness. Does not follow commands. Vitals stable.  MD Rathore notified of findings.   At approx 0200. Pt remains somnolent.  Awakens to pain and vigorous stimulus. Not following commands. MD Rathore notified via text page.  Lab states he spoke to her while drawing AM labs at 0227  0350 staff states he was able to follow commands while vitals were obtained  0545 Much easier to awaken pt. Responded to more gentle stimulus.  Follows command.

## 2020-06-14 NOTE — Consult Note (Signed)
Reason for Consult: '' severe delirium-please assist in med management.'' Patient seen and attempted to assess although he was sleeping soundly. This nurse practitioner did have an opportunity to speak with his daughter [name] who was at the bedside. She reports that he is improving and better than he was yesterday. She states he is alert and oriented, knows who she is. She also reports he was able to eat some of his breakfast. She is very informative and circumstantial,and states that since his diagnosis in 2019 with dementia he has never been this way. "I dont know what happened to daddy but he was very sick. He had stooled on all himself, and urinated on himself, fell and hit his head at [facility]. "   Psychoeducation provided about dementia and delirium, in the setting of acute metabolic encephalopathy. We reviewed his medications that were started yesterday. Patient appears to be responding to psychotropic medications. No recent documentation by nursing staff regarding his behaviors of agitation and aggression. Will continue current medication regimen. Daughter expressed concerns about patient needing SNF at discharge, this is deferred to his primary team.   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 QTc 450 -Continue Depakote sprinkles 250mg  po BID. -Initiate delirium precautions.

## 2020-06-14 NOTE — Progress Notes (Signed)
PROGRESS NOTE                                                                                                                                                                                                             Patient Demographics:    Patrick Phillips, is a 80 y.o. male, DOB - [birth year], ZOX:[id number]  Outpatient Primary MD for the patient is [name], MD   Admit date - 06/10/2020   LOS - 3  Chief Complaint  Patient presents with  . Altered Mental Status       Brief Narrative: Patient is a 80 y.o. male with PMHx of vascular dementia, prior CVA, DM-2, HTN-who presented with fever and confusion.  COVID-19 vaccinated status: Unknown  Significant Events: 4/14>> Admit to [facility] for fever/confusion  Significant studies: 4/14>>Chest x-ray: No PNA 4/14>> CT head: No acute intracranial abnormality. 4/14>> CT C-spine: No fracture 4/14>> MRI brain: No acute infarct. 4/15>> chest x-ray: No PNA 4/15>> CSF: WBC 1 (tube #4), protein 46, glucose 54 4/16>> EEG: Mild to moderate diffuse encephalopathy-no seizures.  COVID-19 medications: Remdesivir: 4/15>>  Antibiotics: Vancomycin: 4/14 x1 Ceftriaxone: 4/14 x 1 Ampicillin: 4/14 x 1  Microbiology data: 4/14 >>blood culture: No growth 4/14>> urine culture: No growth 4/15>> blood culture: No growth 4/15>> urine culture: Multiple species 4/15>> CSF culture: No growth 4/15>> CSF PCR HSV 1/2: Negative. 4/15>> CSF HSV culture: Negative 4/15>> CSF arbovirus IgG: Pending  Procedures: None  Consults: None  DVT prophylaxis: enoxaparin (LOVENOX) injection 40 mg Start: 06/11/20 0600    Subjective:   Slept calmly overnight-awake this morning-relatively alert-answering most of my questions this morning.   Assessment  & Plan :   Acute metabolic encephalopathy-superimposed on dementia with delirium: Encephalopathy was likely related to febrile illness/COVID-19  infection-all culture data negative so far.  Neuroimaging as above.  EEG negative for seizures.  No fever since admission-extensive work-up negative-suspect that encephalopathy that he had on admission has resolved-he now has delirium related to his dementia.  He was initially agitated/combative-however after initiation of Saphris and Depakote-he has improved significantly.  Appreciate psych input.   SIRS-COVID-19 infection: Completed Remdesivir x3 days.  Not hypoxic-no pneumonia-does not require any further therapy.  Interestingly-patient apparently had COVID-19 infection in February as well.  Recent Labs    06/12/20 0455  DDIMER 1.64*  CRP 7.0*  Lab Results  Component Value Date   [place] (A) 06/11/2020   [place] NEGATIVE 01/13/2020    AKI: Mild-likely hemodynamically mediated-solved with IV fluids.  Borderline vitamin B12 deficiency: On supplementation.  Hypokalemia: Replete and recheck.  HTN: BP remains on the higher side-probably due to agitation-increase amlodipine to 10 mg-we will add as needed hydralazine.   DM-2: CBGs relatively stable-continue SSI-oral hypoglycemic agents on hold  Recent Labs    06/13/20 1708 06/13/20 2105 06/14/20 0815  GLUCAP 107* 97 90   History of CVA-MRI brain negative for infarct-on aspirin/statin.  Dementia: On Namenda  GERD: On PPI  Interventions: Ensure Enlive (each supplement provides 350kcal and 20 grams of protein),MVI GI prophylaxis: PPI  ABG:    Component Value Date/Time   TCO2 18 (L) 06/10/2020 1026    Vent Settings: N/A  Condition - Extremely Guarded  Family Communication  :  Daughter-[phone] on 4/18  Code Status :  Full Code  Diet :  Diet Order            Diet regular Room service appropriate? Yes; Fluid consistency: Thin  Diet effective now                  Disposition Plan  :   Status is: Inpatient  Remains inpatient appropriate because:Inpatient level of care appropriate  due to severity of illness   Dispo: The patient is from: Home              Anticipated d/c is to: Home              Patient currently is not medically stable to d/c.   Difficult to place patient No   Barriers to discharge: Resolving encephalopathy-awaiting final culture data.  Antimicorbials  :    Anti-infectives (From admission, onward)   Start     Dose/Rate Route Frequency Ordered Stop   06/12/20 1000  remdesivir 100 mg in sodium chloride 0.9 % 100 mL IVPB        100 mg 200 mL/hr over 30 Minutes Intravenous Daily 06/11/20 0435 06/13/20 0900   06/11/20 0445  remdesivir 200 mg in sodium chloride 0.9% 250 mL IVPB        200 mg 580 mL/hr over 30 Minutes Intravenous Once 06/11/20 0434 06/11/20 0929   06/11/20 0230  vancomycin (VANCOREADY) IVPB 1500 mg/300 mL        1,500 mg 150 mL/hr over 120 Minutes Intravenous  Once 06/11/20 0229 06/11/20 0812   06/11/20 0215  ampicillin (OMNIPEN) 2 g in sodium chloride 0.9 % 100 mL IVPB        2 g 300 mL/hr over 20 Minutes Intravenous  Once 06/11/20 0157 06/11/20 0550   06/11/20 0215  vancomycin (VANCOREADY) IVPB 1000 mg/200 mL  Status:  Discontinued        1,000 mg 200 mL/hr over 60 Minutes Intravenous  Once 06/11/20 0157 06/11/20 0229   06/11/20 0200  cefTRIAXone (ROCEPHIN) 2 g in sodium chloride 0.9 % 100 mL IVPB        2 g 200 mL/hr over 30 Minutes Intravenous  Once 06/11/20 0157 06/11/20 0248      Inpatient Medications  Scheduled Meds: . amLODipine  10 mg Oral Daily  . amLODipine  5 mg Oral NOW  . asenapine  5 mg Sublingual BID  . aspirin EC  81 mg Oral Daily  . atorvastatin  40 mg Oral Daily  . cyanocobalamin  1,000 mcg Subcutaneous Daily  . divalproex  250 mg  Oral Q12H  . enoxaparin (LOVENOX) injection  40 mg Subcutaneous Q24H  . feeding supplement  237 mL Oral BID BM  . insulin aspart  0-9 Units Subcutaneous TID WC  . melatonin  5 mg Oral QHS  . memantine  10 mg Oral BID  . multivitamin with minerals  1 tablet Oral Daily  .  pantoprazole  40 mg Oral Daily   Continuous Infusions:  PRN Meds:.acetaminophen, haloperidol lactate, hydrALAZINE, ondansetron **OR** ondansetron (ZOFRAN) IV   Time Spent in minutes  25    See all Orders from today for further details   Oren Binet M.D on 06/14/2020 at 11:34 AM  To page go to [email or web address] - use universal password  [facility] -  Office  [phone]    Objective:   Vitals:   06/13/20 2335 06/14/20 0351 06/14/20 0400 06/14/20 0812  BP: 140/64 (!) 168/68 138/66 (!) 123/47  Pulse: 60 60 63 60  Resp: 19 16 19 18   Temp: 98.1 F (36.7 C) 97.6 F (36.4 C) 98 F (36.7 C) 97.7 F (36.5 C)  TempSrc: Oral Oral Oral Axillary  SpO2: 91% 94% 91% 91%  Weight:      Height:        Wt Readings from Last 3 Encounters:  06/10/20 86.2 kg  06/10/20 86.2 kg  04/19/20 86.2 kg    No intake or output data in the 24 hours ending 06/14/20 1134   Physical Exam Gen Exam:Alert awake-not in any distress HEENT:atraumatic, normocephalic Chest: B/L clear to auscultation anteriorly CVS:S1S2 regular Abdomen:soft non tender, non distended Extremities:no edema Neurology: Non focal Skin: no rash   Data Review:    CBC Recent Labs  Lab 06/10/20 1026 06/11/20 0100 06/12/20 0455 06/13/20 0449 06/14/20 0140  WBC  --  8.4 6.1 5.2 5.6  HGB 15.3 13.1 12.1* 12.6* 12.0*  HCT 45.0 40.9 37.6* 36.9* 36.3*  PLT  --  211 169 165 164  MCV  --  88.5 86.8 84.1 86.4  MCH  --  28.4 27.9 28.7 28.6  MCHC  --  32.0 32.2 34.1 33.1  RDW  --  14.6 14.6 13.8 14.1  LYMPHSABS  --  0.7 1.5 0.9 1.5  MONOABS  --  0.9 0.5 0.3 0.4  EOSABS  --  0.0 0.1 0.0 0.1  BASOSABS  --  0.0 0.0 0.0 0.0    Chemistries  Recent Labs  Lab 06/10/20 1055 06/11/20 0100 06/12/20 0455 06/13/20 0449 06/14/20 0140  NA 136 135 139 138 141  K 4.4 3.5 3.7 3.2* 3.3*  CL 107 109 112* 108 109  CO2 16* 19* 23 21* 22  GLUCOSE 297* 89 95 108* 108*  BUN 50* 43* 20 12 17   CREATININE 1.53* 1.49* 1.24  1.03 1.05  CALCIUM 8.7* 8.5* 8.4* 8.6* 8.7*  AST 29 27 40 74* 61*  ALT 30 26 27  38 34  ALKPHOS 51 42 36* 40 39  BILITOT 0.6 0.8 0.8 0.8 0.7   ------------------------------------------------------------------------------------------------------------------ No results for input(s): CHOL, HDL, LDLCALC, TRIG, CHOLHDL, LDLDIRECT in the last 72 hours.  Lab Results  Component Value Date   HGBA1C 6.6 (H) 06/11/2020   ------------------------------------------------------------------------------------------------------------------ No results for input(s): TSH, T4TOTAL, T3FREE, THYROIDAB in the last 72 hours.  Invalid input(s): FREET3 ------------------------------------------------------------------------------------------------------------------ Recent Labs    06/12/20 0455  VITAMINB12 219    Coagulation profile Recent Labs  Lab 06/11/20 0100  INR 1.1    Recent Labs    06/12/20 0455  DDIMER 1.64*    Cardiac  Enzymes No results for input(s): CKMB, TROPONINI, MYOGLOBIN in the last 168 hours.  Invalid input(s): CK ------------------------------------------------------------------------------------------------------------------    Component Value Date/Time   BNP 25.0 10/10/2017 2103    Micro Results Recent Results (from the past 240 hour(s))  Culture, blood (Routine X 2) w Reflex to ID Panel     Status: None (Preliminary result)   Collection Time: 06/10/20 11:32 AM   Specimen: BLOOD  Result Value Ref Range Status   Specimen Description BLOOD RIGHT ANTECUBITAL  Final   Special Requests   Final    Blood Culture adequate volume BOTTLES DRAWN AEROBIC AND ANAEROBIC   Culture   Final    NO GROWTH 4 DAYS Performed at [facility], [address], [place], [place] [address]    Report Status PENDING  Incomplete  Culture, blood (Routine X 2) w Reflex to ID Panel     Status: None (Preliminary result)   Collection Time: 06/10/20 11:33 AM   Specimen: BLOOD  Result Value Ref  Range Status   Specimen Description BLOOD BLOOD RIGHT WRIST  Final   Special Requests   Final    Blood Culture adequate volume BOTTLES DRAWN AEROBIC AND ANAEROBIC   Culture   Final    NO GROWTH 4 DAYS Performed at [facility], [address], [place], [place] [address]    Report Status PENDING  Incomplete  Urine Culture     Status: None   Collection Time: 06/10/20  1:05 PM   Specimen: Urine, Clean Catch  Result Value Ref Range Status   Specimen Description   Final    URINE, CLEAN CATCH Performed at [facility], [address], [place], [place] [address]    Special Requests   Final    NONE Performed at [facility], [address], [place], [place] [address]    Culture   Final    NO GROWTH Performed at [facility], [place] [address] [place], [place] [address]    Report Status 06/11/2020 FINAL  Final  Blood Culture (routine x 2)     Status: None (Preliminary result)   Collection Time: 06/11/20  1:00 AM   Specimen: BLOOD LEFT ARM  Result Value Ref Range Status   Specimen Description BLOOD LEFT ARM  Final   Special Requests   Final    BOTTLES DRAWN AEROBIC AND ANAEROBIC Blood Culture adequate volume   Culture   Final    NO GROWTH 3 DAYS Performed at [facility], [place] [address], [place], [place] [address]    Report Status PENDING  Incomplete  Urine culture     Status: Abnormal   Collection Time: 06/11/20  1:49 AM   Specimen: In/Out Cath Urine  Result Value Ref Range Status   Specimen Description IN/OUT CATH URINE  Final   Special Requests   Final    NONE Performed at [facility], [place] [address] [place], [place] [address]    Culture MULTIPLE SPECIES PRESENT, SUGGEST RECOLLECTION (A)  Final   Report Status 06/12/2020 FINAL  Final  Blood Culture (routine x 2)     Status: None (Preliminary result)   Collection Time: 06/11/20  2:00 AM   Specimen: BLOOD RIGHT ARM  Result Value Ref Range Status   Specimen Description BLOOD RIGHT ARM  Final   Special  Requests   Final    BOTTLES DRAWN AEROBIC AND ANAEROBIC Blood Culture adequate volume   Culture   Final    NO GROWTH 3 DAYS  Performed at [facility], [place] [address], [place], [place] [address]    Report Status PENDING  Incomplete  Resp Panel by RT-PCR (Flu A&B, Covid) Nasopharyngeal Swab     Status: Abnormal   Collection Time: 06/11/20  2:29 AM   Specimen: Nasopharyngeal Swab; Nasopharyngeal(NP) swabs in vial transport medium  Result Value Ref Range Status   SARS Coronavirus 2 by RT PCR POSITIVE (A) NEGATIVE Final    Comment: RESULT CALLED TO, READ BACK BY AND VERIFIED WITH: K MOON RN 06/11/20 0411 JDW (NOTE) SARS-CoV-2 target nucleic acids are DETECTED.  The SARS-CoV-2 RNA is generally detectable in upper respiratory specimens during the acute phase of infection. Positive results are indicative of the presence of the identified virus, but do not rule out bacterial infection or co-infection with other pathogens not detected by the test. Clinical correlation with patient history and other diagnostic information is necessary to determine patient infection status. The expected result is Negative.  Fact Sheet for Patients: EntrepreneurPulse.com.au  Fact Sheet for Healthcare Providers: IncredibleEmployment.be  This test is not yet approved or cleared by the [place] FDA and  has been authorized for detection and/or diagnosis of SARS-CoV-2 by FDA under an Emergency Use Authorization (EUA).  This EUA will remain in effect (meaning this test can be used)  for the duration of  the COVID-19 declaration under Section 564(b)(1) of the Act, 21 U.S.C. section 360bbb-3(b)(1), unless the authorization is terminated or revoked sooner.     Influenza A by PCR NEGATIVE NEGATIVE Final   Influenza B by PCR NEGATIVE NEGATIVE Final    Comment: (NOTE) The Xpert Xpress SARS-CoV-2/FLU/RSV plus assay is intended as an aid in the diagnosis of influenza  from Nasopharyngeal swab specimens and should not be used as a sole basis for treatment. Nasal washings and aspirates are unacceptable for Xpert Xpress SARS-CoV-2/FLU/RSV testing.  Fact Sheet for Patients: EntrepreneurPulse.com.au  Fact Sheet for Healthcare Providers: IncredibleEmployment.be  This test is not yet approved or cleared by the [place] FDA and has been authorized for detection and/or diagnosis of SARS-CoV-2 by FDA under an Emergency Use Authorization (EUA). This EUA will remain in effect (meaning this test can be used) for the duration of the COVID-19 declaration under Section 564(b)(1) of the Act, 21 U.S.C. section 360bbb-3(b)(1), unless the authorization is terminated or revoked.  Performed at [facility], [place] [address], [place], [place] [address]   CSF culture     Status: None   Collection Time: 06/11/20  5:20 AM   Specimen: CSF; Cerebrospinal Fluid  Result Value Ref Range Status   Specimen Description CSF  Final   Special Requests NONE  Final   Gram Stain NO WBC SEEN NO ORGANISMS SEEN CYTOSPIN SMEAR   Final   Culture   Final    NO GROWTH 3 DAYS Performed at [facility], [place] [address], [place], [place] [address]    Report Status 06/14/2020 FINAL  Final  Hsv Culture And Typing     Status: None   Collection Time: 06/11/20  5:20 AM   Specimen: Back; Cerebrospinal Fluid  Result Value Ref Range Status   HSV Culture/Type Comment  Final    Comment: (NOTE) Negative No Herpes simplex virus isolated. Performed At: [facility] [place], [place] [id number] Rush Farmer MD LG:[id number]    Source of Sample CSF  Final    Comment: Performed at [facility], [place] [address], [place], [place] [address]  Radiology Reports CT Head Wo Contrast  Result Date: 06/10/2020 CLINICAL DATA:  Head and C-spine injury, fall, altered mental status EXAM: CT HEAD WITHOUT CONTRAST CT CERVICAL  SPINE WITHOUT CONTRAST TECHNIQUE: Multidetector CT imaging of the head and cervical spine was performed following the standard protocol without intravenous contrast. Multiplanar CT image reconstructions of the cervical spine were also generated. COMPARISON:  05/04/2020 FINDINGS: CT HEAD FINDINGS Brain: Stable mild brain atrophy pattern. No acute intracranial hemorrhage, new mass lesion, acute infarction, midline shift, herniation, hydrocephalus, or extra-axial fluid collection. No focal mass effect or edema. Cisterns are patent. Cerebellar atrophy as well. Vascular: Intracranial atherosclerosis at the skull base. No hyperdense vessel. Skull: Normal. Negative for fracture or focal lesion. Sinuses/Orbits: Minor scattered sinus mucosal thickening as before. Orbits are symmetric. No acute finding. Other: None. CT CERVICAL SPINE FINDINGS Alignment: Normal. Skull base and vertebrae: No acute fracture. No primary bone lesion or focal pathologic process. Soft tissues and spinal canal: Normal prevertebral soft tissues. No hemorrhage or hematoma appreciated. Carotid atherosclerosis. Disc levels: Similar mild multilevel degenerative disc disease and facet arthropathy. Facets are aligned. No subluxation or dislocation. Preserved vertebral body heights. Upper chest: Negative. Other: None. IMPRESSION: Stable brain atrophy pattern. No acute intracranial abnormality or interval change by noncontrast CT. Stable mild cervical degenerative changes as above. No acute osseous finding or malalignment by CT. Electronically Signed   By: Jerilynn Mages.  Shick M.D.   On: 06/10/2020 12:31   CT Cervical Spine Wo Contrast  Result Date: 06/10/2020 CLINICAL DATA:  Head and C-spine injury, fall, altered mental status EXAM: CT HEAD WITHOUT CONTRAST CT CERVICAL SPINE WITHOUT CONTRAST TECHNIQUE: Multidetector CT imaging of the head and cervical spine was performed following the standard protocol without intravenous contrast. Multiplanar CT image  reconstructions of the cervical spine were also generated. COMPARISON:  05/04/2020 FINDINGS: CT HEAD FINDINGS Brain: Stable mild brain atrophy pattern. No acute intracranial hemorrhage, new mass lesion, acute infarction, midline shift, herniation, hydrocephalus, or extra-axial fluid collection. No focal mass effect or edema. Cisterns are patent. Cerebellar atrophy as well. Vascular: Intracranial atherosclerosis at the skull base. No hyperdense vessel. Skull: Normal. Negative for fracture or focal lesion. Sinuses/Orbits: Minor scattered sinus mucosal thickening as before. Orbits are symmetric. No acute finding. Other: None. CT CERVICAL SPINE FINDINGS Alignment: Normal. Skull base and vertebrae: No acute fracture. No primary bone lesion or focal pathologic process. Soft tissues and spinal canal: Normal prevertebral soft tissues. No hemorrhage or hematoma appreciated. Carotid atherosclerosis. Disc levels: Similar mild multilevel degenerative disc disease and facet arthropathy. Facets are aligned. No subluxation or dislocation. Preserved vertebral body heights. Upper chest: Negative. Other: None. IMPRESSION: Stable brain atrophy pattern. No acute intracranial abnormality or interval change by noncontrast CT. Stable mild cervical degenerative changes as above. No acute osseous finding or malalignment by CT. Electronically Signed   By: Jerilynn Mages.  Shick M.D.   On: 06/10/2020 12:31   MR Brain Wo Contrast (neuro protocol)  Result Date: 06/10/2020 CLINICAL DATA:  Acute neuro deficit.  Mental status change. EXAM: MRI HEAD WITHOUT CONTRAST TECHNIQUE: Multiplanar, multiecho pulse sequences of the brain and surrounding structures were obtained without intravenous contrast. COMPARISON:  CT head 06/10/2020 FINDINGS: Brain: Ventricle size and cerebral volume normal for age. Negative for acute infarct. No significant chronic ischemic change. Negative for hemorrhage or mass. Image quality degraded by mild motion. Vascular: Partial loss  of flow void in the left internal carotid artery through the skull base and the distal left vertebral artery which may be due  to slow flow or stenosis. Skull and upper cervical spine: Negative Sinuses/Orbits: Mild mucosal edema paranasal sinuses. No orbital mass. Bilateral exophthalmos. Other: None IMPRESSION: Negative for acute infarct. Partial loss of flow void in the left internal carotid artery distal left vertebral artery which could be due to proximal stenosis. Consider CT angio head and neck for further evaluation. Electronically Signed   By: Franchot Gallo M.D.   On: 06/10/2020 15:09   DG Chest Port 1 View  Result Date: 06/11/2020 CLINICAL DATA:  Questionable sepsis. EXAM: PORTABLE CHEST 1 VIEW COMPARISON:  June 10, 2020 FINDINGS: There is no evidence of acute infiltrate, pleural effusion or pneumothorax. The heart size and mediastinal contours are within normal limits. A radiopaque loop recorder device is seen. The visualized skeletal structures are unremarkable. IMPRESSION: No active disease. Electronically Signed   By: Virgina Norfolk M.D.   On: 06/11/2020 03:10   DG Chest Port 1 View  Result Date: 06/10/2020 CLINICAL DATA:  Altered mental status. EXAM: PORTABLE CHEST 1 VIEW COMPARISON:  May 25, 2020 FINDINGS: Loop recorder in place. Cardiomediastinal silhouette is normal. Mediastinal contours appear intact. Tortuosity and calcific atherosclerotic disease of the aorta. There is no evidence of focal airspace consolidation, pleural effusion or pneumothorax. Osseous structures are without acute abnormality. Soft tissues are grossly normal. IMPRESSION: No active disease. Electronically Signed   By: Fidela Salisbury M.D.   On: 06/10/2020 11:15   EEG adult  Result Date: 06/12/2020 Lora Havens, MD     06/12/2020  3:07 PM Patient Name: Javonnie Illescas MRN: [id number] Epilepsy Attending: Lora Havens Referring Physician/Provider: Dr Oren Binet Date: 06/12/2020 Duration: 25.42 mins  Patient history: 80yo M with ams. EEG to evaluate for seizure Level of alertness: Awake AEDs during EEG study: None Technical aspects: This EEG study was done with scalp electrodes positioned according to the 10-20 International system of electrode placement. Electrical activity was acquired at a sampling rate of 500Hz  and reviewed with a high frequency filter of 70Hz  and a low frequency filter of 1Hz . EEG data were recorded continuously and digitally stored. Description: No posterior dominant rhythm was seen. EEG showed continuous generalized 5 to 6 Hz theta slowing. Hyperventilation and photic stimulation were not performed.   ABNORMALITY - Continuous slow, generalized IMPRESSION: This study is suggestive of mild to moderate diffuse encephalopathy, nonspecific etiology. No seizures or epileptiform discharges were seen throughout the recording. Lora Havens   CUP PACEART REMOTE DEVICE CHECK  Result Date: 05/22/2020 ILR summary report received. Battery status OK. Normal device function. No new symptom, tachy, brady, or pause episodes. No new AF episodes. Monthly summary reports and ROV/PRN.  R. Powers, CVRS  DG Hips Bilat W or Wo Pelvis 3-4 Views  Result Date: 06/10/2020 CLINICAL DATA:  Found on side of road, last seen normal at 2130 hours last night, fall, dementia, tenderness of hips bilaterally EXAM: DG HIP (WITH OR WITHOUT PELVIS) 3-4V BILAT COMPARISON:  None FINDINGS: Osseous demineralization. Hip and SI joint spaces preserved. No acute fracture, dislocation, or bone destruction. Mild degenerative disc disease changes at visualized lower lumbar spine. IMPRESSION: No acute osseous abnormalities. Electronically Signed   By: Lavonia Dana M.D.   On: 06/10/2020 11:48

## 2020-06-15 DIAGNOSIS — I10 Essential (primary) hypertension: Secondary | ICD-10-CM | POA: Diagnosis not present

## 2020-06-15 DIAGNOSIS — R410 Disorientation, unspecified: Secondary | ICD-10-CM

## 2020-06-15 DIAGNOSIS — G9341 Metabolic encephalopathy: Secondary | ICD-10-CM

## 2020-06-15 DIAGNOSIS — E119 Type 2 diabetes mellitus without complications: Secondary | ICD-10-CM | POA: Diagnosis not present

## 2020-06-15 DIAGNOSIS — F0151 Vascular dementia with behavioral disturbance: Secondary | ICD-10-CM | POA: Diagnosis not present

## 2020-06-15 LAB — BASIC METABOLIC PANEL
Anion gap: 6 (ref 5–15)
BUN: 19 mg/dL (ref 8–23)
CO2: 27 mmol/L (ref 22–32)
Calcium: 8.8 mg/dL — ABNORMAL LOW (ref 8.9–10.3)
Chloride: 105 mmol/L (ref 98–111)
Creatinine, Ser: 1.04 mg/dL (ref 0.61–1.24)
GFR, Estimated: >60 mL/min (ref >60)
Glucose, Bld: 145 mg/dL — ABNORMAL HIGH (ref 70–99)
Potassium: 3.4 mmol/L — ABNORMAL LOW (ref 3.5–5.1)
Sodium: 138 mmol/L (ref 135–145)

## 2020-06-15 LAB — GLUCOSE, CAPILLARY
Glucose-Capillary: 144 mg/dL — ABNORMAL HIGH (ref 70–99)
Glucose-Capillary: 154 mg/dL — ABNORMAL HIGH (ref 70–99)
Glucose-Capillary: 128 mg/dL — ABNORMAL HIGH (ref 70–99)
Glucose-Capillary: 144 mg/dL — ABNORMAL HIGH (ref 70–99)

## 2020-06-15 LAB — CULTURE, BLOOD (ROUTINE X 2)
Culture: NO GROWTH
Culture: NO GROWTH
Special Requests: ADEQUATE
Special Requests: ADEQUATE

## 2020-06-15 LAB — TSH: TSH: 2.58 u[IU]/mL (ref 0.350–4.500)

## 2020-06-15 LAB — HIV ANTIBODY (ROUTINE TESTING W REFLEX): HIV Screen 4th Generation wRfx: NONREACTIVE

## 2020-06-15 LAB — MAGNESIUM: Magnesium: 2 mg/dL (ref 1.7–2.4)

## 2020-06-15 MED ORDER — THIAMINE HCL 100 MG PO TABS
100.0000 mg | ORAL_TABLET | Freq: Every day | ORAL | Status: DC
  Administered 2020-06-16 – 2020-07-14 (×29): 100 mg via ORAL
  Filled 2020-06-15 (×29): qty 1

## 2020-06-15 MED ORDER — THIAMINE HCL 100 MG/ML IJ SOLN
100.0000 mg | Freq: Every day | INTRAMUSCULAR | Status: DC
  Filled 2020-06-15 (×2): qty 2

## 2020-06-15 MED ORDER — LORAZEPAM 2 MG/ML IJ SOLN
2.0000 mg | Freq: Four times a day (QID) | INTRAMUSCULAR | Status: DC | PRN
  Administered 2020-06-15 (×2): 1 mg via INTRAMUSCULAR
  Filled 2020-06-15: qty 1

## 2020-06-15 MED ORDER — POTASSIUM CHLORIDE CRYS ER 20 MEQ PO TBCR
40.0000 meq | EXTENDED_RELEASE_TABLET | Freq: Four times a day (QID) | ORAL | Status: AC
  Administered 2020-06-15 (×2): 40 meq via ORAL
  Filled 2020-06-15 (×2): qty 2

## 2020-06-15 NOTE — Progress Notes (Signed)
Calm, cooperative, placed in recliner with alarming easy release seat belt. Oriented to time and place.

## 2020-06-15 NOTE — Progress Notes (Signed)
CSW left voicemail for patient's daughter [name] regarding request for facility placement.   Anatalia Kronk LCSW

## 2020-06-15 NOTE — Consult Note (Signed)
Reason for Consult: '' severe delirium-please assist in med management.'' Patient is seen and assessed face to face.  Patient was observed to be sitting upright in his bedside chair.  He is alert and oriented x2.  During the assessment patient did not request to get his clothing on, in which the nurse assisted him to get dressed.  In observation he was able to dress himself appropriately, to include his undergarments, and proper placing of his T-shirt over his head.  Patient does tell me " I am doing good."  In his affect is congruent and appropriate.  Patient denies any hallucinations, psychosis, delusions, and or paranoia.  He denies any fear, and states he feels safe while in the hospital.  Patient appears to be easily redirected, although he is requiring frequent reorientation and redirection.  Patient continues to have a favorable response to psychotropic medication, in addition to improvement in his clinical labs and ongoing medical conditions.  Patient was seen for reassessment. Patient was observed sitting in a chair. Patient was alert and  Oriented x2.  He tells me that "Obama is the president, and that he is at school. " He is fairly groomed and appears to have been bathed. His mood is euthymic and his affect is congruent and appropriate. Writer was able to gain his attention a few times during the re-assessment ( he responded to his name being called, and when trying to redirect him).  He does not have a Air cabin crew in place, although his nurse is present to assist with orientation.  Patient appears to improving from when he originally presented to the ER. Per chart review he continues to have restless at time(1 documented episode of agitation last night that resulted in his IV being lost). However is improved with hands on care, ambulation and talking to the patient. Patient may benefit from skilled nursing rehab or assisted living facility.  Patient denied suicidality, homicidality, or  hallucinations.  He does not appear to be responding to internal stimuli, external stimuli, and answers most questions appropriately.  -will psych clear at this time, it is encouraged that we continue current medications and safety measures. He may experience some confusion, agitation, and restlessness as well as Sund[employer] this is completely normal behavior in a person of this age. It is felt that patient may need higher level of care due to worsening aggression, confusion, agitation, and restlessness. Patient behaviors are consistent with a person with dementia and other memory loss. SNF,  ALF, Home Health unit would be appropriate as they are well equipped to help elderly persons with memory loss, maintain cognitive skills and help with quality of life. Patient is able to take care of self, however needs more social interaction and person centered care to help with worsening aggression and difficult dementia behaviors.  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 QTc 450 -Continue Depakote sprinkles 250mg  po BID. -Initiate delirium precautions.  -Recommend TOC consult, to evaluate for skilled nursing facility.
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
## 2020-06-15 NOTE — Consult Note (Addendum)
NEUROLOGY CONSULTATION NOTE   Date of service: June 15, 2020 Patient Name: Patrick Phillips MRN:  [id number] DOB:  06-28-1940 Reason for consult: "Severe AMS post COVID 24" _ _ _   _ __   _ __ _ _  __ __   _ __   __ _  History of Present Illness  Patrick Phillips is a 80 y.o. male who  has a past medical history of Cancer ([place]), Dementia ([place]), Diabetes mellitus without complication ([place]), Hypertension, and Memory deficit. He presents with severe AMS. At baseline he normally is still very functional: walks his dog every morning and lives at house. This morning however, he went out for his walk, his dog appeared back at house but patient was missing. Ultimately a state trooper found patient lying at the roadside.  Pt was confused, though no evidence of any immediate trauma. Pt brought to [facility] ED initially before being discharged home. After discharge home he continued to be confused and so was brought in to ED at [facility]. Pt with severe AMS, unable to provide any history, thinks he is still walking his dog,    ROS   Constitutional Denies weight loss, fever and chills.   HEENT Denies changes in vision and hearing.   Respiratory Denies SOB and cough.   CV Denies palpitations and CP   GI Denies abdominal pain, nausea, vomiting and diarrhea.   GU Denies dysuria and urinary frequency.   MSK Denies myalgia and joint pain.   Skin Denies rash and pruritus.   Neurological Denies headache and syncope.   Psychiatric Denies recent changes in mood. Denies anxiety and depression.    Past History   Past Medical History:  Diagnosis Date  . Cancer ([place])    skin cancer  . Dementia ([place])   . Diabetes mellitus without complication ([place])   . Hypertension   . Memory deficit    Past Surgical History:  Procedure Laterality Date  . BACK SURGERY    . HERNIA REPAIR    . LOOP RECORDER INSERTION N/A 07/26/2017   Procedure: LOOP RECORDER INSERTION;  Surgeon: Evans Lance, MD;  Location: [facility];  Service:  Cardiovascular;  Laterality: N/A;  . TEE WITHOUT CARDIOVERSION N/A 07/26/2017   Procedure: TRANSESOPHAGEAL ECHOCARDIOGRAM (TEE);  Surgeon: Fay Records, MD;  Location: [facility] ENDOSCOPY;  Service: Cardiovascular;  Laterality: N/A;  . TRIGGER FINGER RELEASE Right 12/02/2015   Procedure: RIGHT THUMB RELEASE TRIGGER FINGER/A-1 PULLEY;  Surgeon: Leanora Cover, MD;  Location: [facility];  Service: Orthopedics;  Laterality: Right;   Family History  Problem Relation Age of Onset  . Cancer Mother   . Cancer Father   . Cancer Brother   . Cancer Other    Social History   Socioeconomic History  . Marital status: Married    Spouse name: Not on file  . Number of children: Not on file  . Years of education: Not on file  . Highest education level: Not on file  Occupational History  . Not on file  Tobacco Use  . Smoking status: Never Smoker  . Smokeless tobacco: Never Used  Vaping Use  . Vaping Use: Never used  Substance and Sexual Activity  . Alcohol use: Yes    Comment: occ  . Drug use: No  . Sexual activity: Not on file  Other Topics Concern  . Not on file  Social History Narrative  . Not on file   Social Determinants of Health   Financial  Resource Strain: Not on file  Food Insecurity: Not on file  Transportation Needs: Not on file  Physical Activity: Not on file  Stress: Not on file  Social Connections: Not on file   Allergies  Allergen Reactions  . Aricept [Donepezil]     Per daughter, increased mood swings and angry    Medications   Medications Prior to Admission  Medication Sig Dispense Refill Last Dose  . amLODipine (NORVASC) 5 MG tablet Take 5 mg by mouth 2 (two) times daily.   06/10/2020 at Unknown time  . Artificial Tear Ointment (DRY EYES OP) Apply 1 drop to eye daily as needed (dry eyes).   Past Month at Unknown time  . aspirin EC 81 MG tablet Take 81 mg by mouth daily.   06/10/2020 at Unknown time  . atorvastatin (LIPITOR) 40 MG tablet Take 40 mg by  mouth daily.   06/10/2020 at Unknown time  . escitalopram (LEXAPRO) 20 MG tablet Take 20 mg by mouth daily.   06/10/2020 at Unknown time  . esomeprazole (NEXIUM) 40 MG capsule Take 40 mg by mouth daily.   06/10/2020 at Unknown time  . glipiZIDE (GLUCOTROL) 5 MG tablet Take 5 mg by mouth daily.   06/10/2020 at Unknown time  . meloxicam (MOBIC) 15 MG tablet Take 15 mg by mouth daily.   06/10/2020 at Unknown time  . memantine (NAMENDA) 10 MG tablet Take 1 tablet (10 mg total) by mouth 2 (two) times daily. 180 tablet 3 06/10/2020 at Unknown time  . metFORMIN (GLUCOPHAGE) 1000 MG tablet Take 1,000 mg by mouth 2 (two) times daily.    06/10/2020 at Unknown time  . atorvastatin (LIPITOR) 20 MG tablet Take 3 tablets (60 mg total) by mouth daily for 30 days. (Patient not taking: Reported on 06/10/2020) 90 tablet 1 Not Taking at Unknown time     Vitals   Vitals:   06/15/20 0005 06/15/20 0430 06/15/20 0734 06/15/20 1157  BP: (!) 165/88 (!) 151/75 (!) 172/78 (!) 154/77  Pulse: 60 65 72 78  Resp: 20 17 19 17   Temp: 97.6 F (36.4 C) 98 F (36.7 C) 98 F (36.7 C) 97.6 F (36.4 C)  TempSrc: Axillary Oral Oral Oral  SpO2: 90% 95% 98% 97%  Weight:      Height:         Body mass index is 25.77 kg/m.  Physical Exam   General: sitting comfortably on side of bed; in no acute distress.  HENT: Normal oropharynx and mucosa. Normal external appearance of ears and nose.  Neck: Supple, no pain or tenderness  CV: No JVD. No peripheral edema.  Pulmonary: Symmetric Chest rise. Normal respiratory effort.  Abdomen: Soft to touch, non-tender.  Ext: No cyanosis, edema, or deformity  Skin: No rash. Normal palpation of skin.   Musculoskeletal: Normal digits and nails by inspection. No clubbing.   Neurologic Examination  Mental status/Cognition: Alert, oriented to self only, (states age - 7, month - June, year - 1942, Software engineer - doesn't know, quarters in a dollar - 6) unable to follow all commands, required multiple  promptings, poor attention. Unable to do 3 item recall (remembered 0 out of 3). Speech/language: Fluent, comprehension intact Cranial nerves:   CN II Pupils equal and reactive to light, no VF deficits    CN III,IV,VI EOM intact, no gaze preference or deviation, no nystagmus    CN V normal sensation in V1, V2, and V3 segments bilaterally    CN VII Mild right sided  facial droop   CN VIII normal hearing to speech    CN IX & X normal palatal elevation, no uvular deviation    CN XI 5/5 head turn and 5/5 shoulder shrug bilaterally    CN XII midline tongue protrusion   Motor:  RUE: 5/5  LUE: 5/5 RLE: 5/5  LLE: 5/5 Grip strength intact bilaterally Muscle bulk: normal, tone normal, no pronator drift or tremor present Sensation: FT intact bilaterally Coordination/Complex Motor:  - Finger to Nose intact but slow - Rapid alternating movement- slowed - Gait: narrow-based, shuffling gait  Labs   CBC:  Recent Labs  Lab 06/13/20 0449 06/14/20 0140  WBC 5.2 5.6  NEUTROABS 4.0 3.5  HGB 12.6* 12.0*  HCT 36.9* 36.3*  MCV 84.1 86.4  PLT 165 604    Basic Metabolic Panel:  Lab Results  Component Value Date   NA 138 06/15/2020   K 3.4 (L) 06/15/2020   CO2 27 06/15/2020   GLUCOSE 145 (H) 06/15/2020   BUN 19 06/15/2020   CREATININE 1.04 06/15/2020   CALCIUM 8.8 (L) 06/15/2020   GFRNONAA >60 06/15/2020   GFRAA >60 05/20/2018   Lipid Panel:  Lab Results  Component Value Date   LDLCALC 96 05/09/2018   HgbA1c:  Lab Results  Component Value Date   HGBA1C 6.6 (H) 06/11/2020   Urine Drug Screen: No results found for: LABOPIA, COCAINSCRNUR, LABBENZ, AMPHETMU, THCU, LABBARB  Alcohol Level     Component Value Date/Time   ETH <10 07/20/2017 2227    CT Head without contrast: Stable brain atrophy pattern. No acute intracranial abnormality or interval change by noncontrast CT. Stable mild cervical degenerative changes as above. No acute osseous finding or malalignment by CT.  MRI Brain  WO Contrast: Negative for acute infarct. Partial loss of flow void in the left internal carotid artery distal left vertebral artery which could be due to proximal stenosis. Consider CT angio head and neck for further evaluation.  rEEG:  Suggestive of mild to moderate diffuse encephalopathy, nonspecific etiology. No seizures or epileptiform discharges were seen throughout the recording.  CSF:  Clear Colorless WBC 3 Protein 46 (minimally elevated) Culture: No growth x 3 days HSV 1/2 PCR: Negative.    Impression   Kaelin Bonelli is a 80 y.o. 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 His neurologic examination is notable for cognitive deficits, including disorientation and poor memory.  - He has recently had COVID. DDx includes post-Covid encephalopathy.   - Psychiatry has already reviewed and recommended no changes to medications.  - So far has had Negative VDRL and HSV labs, Ammonia of 11, and mildly low B12 of 219 (being supplemented).  - CSF from 4/15 unremarkable - Will order B1, TSH, and HIV testing to continue work up for reversible causes of delirium.  - DDx for his hypoactive delirium includes spontaneous cognitive fluctuation in the context of baseline dementia, post-Covid encephalopathy and unwitnessed seizure (found lying on side of the road disoriented) followed by postictal state. - Regarding the DDx of possible unwitnessed seizure, EEG was suggestive of mild to moderate diffuse encephalopathy, nonspecific to etiology. No seizures or epileptiform discharges were seen throughout the recording..   Recommendations  -Ordered B1, TSH, and HIV -Begin B1 supplementation after lab draw, can stop if result within normal limits - Avoid sedating medications, including medications with anticholinergic properties.  - Discontinuing Namenda, which paradoxically can worsen cognition in some dementia patients - Consider discontinuation of  Haldol   ______________________________________________________________________   Fatima Sanger MD Resident  I have seen and examined the patient. I have amended the assessment and recommendations. 80 y.o. 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 His neurologic examination is notable for cognitive deficits, including disorientation and poor memory. DDx for his hypoactive delirium includes spontaneous cognitive fluctuation in the context of baseline dementia, post-Covid encephalopathy and unwitnessed seizure (found lying on side of the road disoriented) followed by postictal state. Recommendations as above.  Electronically signed: Dr. Kerney Elbe

## 2020-06-15 NOTE — Progress Notes (Signed)
   06/14/20 0400  Assess: MEWS Score  Temp 98 F (36.7 C)  BP 138/66  Pulse Rate 63  Resp 19  SpO2 91 %  Assess: MEWS Score  MEWS Temp 0  MEWS Systolic 0  MEWS Pulse 0  MEWS RR 0  MEWS LOC 2  MEWS Score 2  MEWS Score Color Yellow  Assess: if the MEWS score is Yellow or Red  Were vital signs taken at a resting state? Yes  Focused Assessment No change from prior assessment  Early Detection of Sepsis Score *See Row Information* Low  MEWS guidelines implemented *See Row Information* Yes  Treat  MEWS Interventions Escalated (See documentation below)  Pain Scale PAINAD  Breathing 0  Negative Vocalization 0  Facial Expression 0  Body Language 0  Consolability 0  PAINAD Score 0  Take Vital Signs  Increase Vital Sign Frequency  Yellow: Q 2hr X 2 then Q 4hr X 2, if remains yellow, continue Q 4hrs  Escalate  MEWS: Escalate Yellow: discuss with charge nurse/RN and consider discussing with provider and RRT  Notify: Charge Nurse/RN  Name of Charge Nurse/RN Notified Milo, Rn  Date Charge Nurse/RN Notified 06/14/20  Time Charge Nurse/RN Notified 0400  Document  Patient Outcome Other (Comment) (No change MD aware)  Progress note created (see row info) Yes

## 2020-06-15 NOTE — Progress Notes (Signed)
Overnight:    Pt repeatedly exiting bed.  Multiple staff members attempted to reorient the pt and he was placed back in his bed several times.  Pt was in the hallway attempting to enter another patient's room. Repeated statements throughout the evening saying, "you guys can just kill me now." Or asking staff members, "Are you going to kill me in the morning."  Attempted on multiple occasions to reassure the patient that he is safe and in the hospital, but no evidence of understanding was noted.    MD Marlowe Sax was notified of the patient's condition by charge RN.  Haldol orderd to be given.  Post Haldol, the patient was found in the hallway attempted to enter another patient's room and was resistant to reentering his own.  Pt had removed his concealed IV.  Pt was placed in restraints for safety, MD notified. Order placed.

## 2020-06-16 DIAGNOSIS — E119 Type 2 diabetes mellitus without complications: Secondary | ICD-10-CM | POA: Diagnosis not present

## 2020-06-16 DIAGNOSIS — G9341 Metabolic encephalopathy: Secondary | ICD-10-CM | POA: Diagnosis not present

## 2020-06-16 DIAGNOSIS — F0151 Vascular dementia with behavioral disturbance: Secondary | ICD-10-CM | POA: Diagnosis not present

## 2020-06-16 DIAGNOSIS — I10 Essential (primary) hypertension: Secondary | ICD-10-CM | POA: Diagnosis not present

## 2020-06-16 LAB — CULTURE, BLOOD (ROUTINE X 2)
Culture: NO GROWTH
Culture: NO GROWTH
Special Requests: ADEQUATE
Special Requests: ADEQUATE

## 2020-06-16 LAB — GLUCOSE, CAPILLARY
Glucose-Capillary: 117 mg/dL — ABNORMAL HIGH (ref 70–99)
Glucose-Capillary: 134 mg/dL — ABNORMAL HIGH (ref 70–99)
Glucose-Capillary: 234 mg/dL — ABNORMAL HIGH (ref 70–99)
Glucose-Capillary: 134 mg/dL — ABNORMAL HIGH (ref 70–99)
Glucose-Capillary: 160 mg/dL — ABNORMAL HIGH (ref 70–99)

## 2020-06-16 MED ORDER — HALOPERIDOL LACTATE 5 MG/ML IJ SOLN
5.0000 mg | Freq: Four times a day (QID) | INTRAMUSCULAR | Status: DC | PRN
  Administered 2020-06-16 – 2020-07-11 (×28): 5 mg via INTRAMUSCULAR
  Filled 2020-06-16 (×28): qty 1

## 2020-06-16 MED ORDER — LORAZEPAM 0.5 MG PO TABS
0.5000 mg | ORAL_TABLET | Freq: Every day | ORAL | Status: DC
  Administered 2020-06-16: 0.5 mg via ORAL
  Filled 2020-06-16: qty 1

## 2020-06-16 NOTE — Progress Notes (Signed)
OT Cancellation Note  Patient Details Name: Patrick Phillips MRN: [id number] DOB: 07/02/1940   Cancelled Treatment:    Reason Eval/Treat Not Completed: Fatigue/lethargy limiting ability to participate. Pt in recliner sleeping heavily around 12:05 pm, unable to rouse; no distress noted or observed. Will follow up next available time  [name] 06/16/2020, 1:42 PM

## 2020-06-16 NOTE — Evaluation (Signed)
Clinical/Bedside Swallow Evaluation Patient Details  Name: Patrick Phillips MRN: [id number] Date of Birth: December 05, 1940  Today's Date: 06/16/2020 Time: SLP Start Time (ACUTE ONLY): 1606 SLP Stop Time (ACUTE ONLY): 1619 SLP Time Calculation (min) (ACUTE ONLY): 13.22 min  Past Medical History:  Past Medical History:  Diagnosis Date  . Cancer ([place])    skin cancer  . Dementia ([place])   . Diabetes mellitus without complication ([place])   . Hypertension   . Memory deficit    Past Surgical History:  Past Surgical History:  Procedure Laterality Date  . BACK SURGERY    . HERNIA REPAIR    . LOOP RECORDER INSERTION N/A 07/26/2017   Procedure: LOOP RECORDER INSERTION;  Surgeon: Evans Lance, MD;  Location: [facility];  Service: Cardiovascular;  Laterality: N/A;  . TEE WITHOUT CARDIOVERSION N/A 07/26/2017   Procedure: TRANSESOPHAGEAL ECHOCARDIOGRAM (TEE);  Surgeon: Fay Records, MD;  Location: [facility] ENDOSCOPY;  Service: Cardiovascular;  Laterality: N/A;  . TRIGGER FINGER RELEASE Right 12/02/2015   Procedure: RIGHT THUMB RELEASE TRIGGER FINGER/A-1 PULLEY;  Surgeon: Leanora Cover, MD;  Location: [facility];  Service: Orthopedics;  Laterality: Right;   HPI:  Pt is a 80 y.o. male with PMHx of vascular dementia, CVA, DM-2, HTN. On 4/14, pt took his dog for a walk and the dog returned home without him. Pt was found lying on the side of road with no memory of what had happened but able to follow commands. Pt was taken to [facility] and d/c home with daughter following negative workup, but pt was brough to [facility] ED due to increased confusion. Pt found to be febrile and COVID+ and admitted for acute metabolic encephalopathy. MRI brain 4/14 negative for acute changes. CXR 4/15 negative for active disease. EEG 4/16: mild to moderate diffuse encephalopathy, nonspecific etiology. No seizures or epileptiform discharges   Assessment / Plan / Recommendation Clinical Impression  Pt was seen for bedside swallow  evaluation and he denied a history of dysphagia. Oral mechanism exam was [facility] and dentition was limited to six anterior mandibular teeth only. He exhibited throat clearing and a slight cough with thin liquids via cup which were consumed with him in a reclined positioned upon SLP's entry. However, he tolerated all other liquids and solids without signs or symptoms of oropharyngeal dysphagia once adequate positioning was achieved. Mastication and oral clearance were adequate despite limited dentition. Pt's current diet of regular texture solids and thin liquids will be continued and SLP will follow to assess diet tolerance. SLP Visit Diagnosis: Dysphagia, unspecified (R13.10)    Aspiration Risk  Mild aspiration risk    Diet Recommendation Regular;Thin liquid   Liquid Administration via: Cup;Straw Medication Administration: Whole meds with liquid Supervision: Staff to assist with self feeding Compensations: Minimize environmental distractions Postural Changes: Seated upright at 90 degrees    Other  Recommendations Oral Care Recommendations: Oral care BID   Follow up Recommendations  (TBD)      Frequency and Duration min 1 x/week  1 week       Prognosis Prognosis for Safe Diet Advancement: Good Barriers to Reach Goals: Cognitive deficits      Swallow Study   General Date of Onset: 06/15/20 HPI: Pt is a 80 y.o. male with PMHx of vascular dementia, CVA, DM-2, HTN. On 4/14, pt took his dog for a walk and the dog returned home without him. Pt was found lying on the side of road with no memory of what had happened but  able to follow commands. Pt was taken to [facility] and d/c home with daughter following negative workup, but pt was brough to [facility] ED due to increased confusion. Pt found to be febrile and COVID+ and admitted for acute metabolic encephalopathy. MRI brain 4/14 negative for acute changes. CXR 4/15 negative for active disease. EEG 4/16: mild to moderate diffuse encephalopathy, nonspecific  etiology. No seizures or epileptiform discharges Type of Study: Bedside Swallow Evaluation Previous Swallow Assessment: none Diet Prior to this Study: Regular;Thin liquids Temperature Spikes Noted: No Respiratory Status: Room air History of Recent Intubation: No Behavior/Cognition: Alert;Cooperative;Confused Oral Cavity Assessment: Within Functional Limits Oral Care Completed by SLP: No Oral Cavity - Dentition: Missing dentition (6 only) Vision: Functional for self-feeding Self-Feeding Abilities: Needs assist Patient Positioning: Upright in bed;Postural control adequate for testing Baseline Vocal Quality: Normal Volitional Cough: Strong Volitional Swallow: Able to elicit    Oral/Motor/Sensory Function Overall Oral Motor/Sensory Function: Within functional limits   Ice Chips Ice chips: Within functional limits Presentation: Spoon   Thin Liquid Thin Liquid: Impaired Presentation: Cup;Straw Pharyngeal  Phase Impairments: Throat Clearing - Immediate    Nectar Thick Nectar Thick Liquid: Not tested   Honey Thick Honey Thick Liquid: Not tested   Puree Puree: Within functional limits Presentation: Spoon   Solid     Solid: Within functional limits Presentation: Toyah I. Hardin Negus, [place], [place] Office number [phone] Pager [place] 06/16/2020,4:29 PM

## 2020-06-16 NOTE — Progress Notes (Signed)
PROGRESS NOTE                                                                                                                                                                                                             Patient Demographics:    Patrick Phillips, is a 80 y.o. male, DOB - [birth year], ALP:[id number]  Outpatient Primary MD for the patient is [name], MD   Admit date - 06/10/2020   LOS - 5  Chief Complaint  Patient presents with  . Altered Mental Status       Brief Narrative: Patient is a 80 y.o. male with PMHx of vascular dementia, prior CVA, DM-2, HTN-who presented with fever and confusion.  COVID-19 vaccinated status: Unknown  Significant Events: 4/14>> Admit to [facility] for fever/confusion  Significant studies: 4/14>>Chest x-ray: No PNA 4/14>> CT head: No acute intracranial abnormality. 4/14>> CT C-spine: No fracture 4/14>> MRI brain: No acute infarct. 4/15>> chest x-ray: No PNA 4/15>> CSF: WBC 1 (tube #4), protein 46, glucose 54 4/16>> EEG: Mild to moderate diffuse encephalopathy-no seizures.  COVID-19 medications: Remdesivir: 4/15>>  Antibiotics: Vancomycin: 4/14 x1 Ceftriaxone: 4/14 x 1 Ampicillin: 4/14 x 1  Microbiology data: 4/14 >>blood culture: No growth 4/14>> urine culture: No growth 4/15>> blood culture: No growth 4/15>> urine culture: Multiple species 4/15>> CSF culture: No growth 4/15>> CSF PCR HSV 1/2: Negative. 4/15>> CSF HSV culture: Negative 4/15>> CSF arbovirus IgG: Pending 4/15>> CSF VDRL: Negative  Procedures: None  Consults: None  DVT prophylaxis: enoxaparin (LOVENOX) injection 40 mg Start: 06/11/20 0600    Subjective:   Was significantly agitated yesterday afternoon required Ativan IV x1 following which patient was mostly sedated.  He had an uneventful night.  Remains confused this morning but completely awake.   Assessment  & Plan :   Acute metabolic  encephalopathy-superimposed on dementia with delirium: Encephalopathy on presentation was likely related to febrile illness/COVID-19 infection-all culture data negative so far.  Neuroimaging as above.  EEG negative for seizures.  He has had no fever since admission-his encephalopathy that he had on admission has resolved-I suspect his confusion/agitation and now is related to delirium associated with dementia.  Evaluated by psychiatry and started on Saphris and Depakote-he continues to have significant episodes of breakthrough confusion/agitation-psychiatry continues to follow-we will await further recommendations.  Appreciate neurology input on 4/19.  SIRS-COVID-19 infection: Completed Remdesivir x3 days.  Not hypoxic-no pneumonia-does not require any further therapy.  Interestingly-patient apparently had COVID-19 infection in February as well.  No results for input(s): DDIMER, FERRITIN, LDH, CRP in the last 72 hours.  Lab Results  Component Value Date   [place] (A) 06/11/2020   [place] NEGATIVE 01/13/2020    AKI: Mild-likely hemodynamically mediated-resolved with IV fluids.  Borderline vitamin B12 deficiency: On supplementation.  Hypokalemia: Continue to replete and recheck.  HTN: BP relatively stable-continue amlodipine.  DM-2: CBGs relatively stable-continue SSI-oral hypoglycemic agents on hold  Recent Labs    06/15/20 2108 06/16/20 0456 06/16/20 0808  GLUCAP 144* 117* 134*   History of CVA-MRI brain negative for infarct-on aspirin/statin.  Dementia: On Namenda  GERD: On PPI  Interventions: Ensure Enlive (each supplement provides 350kcal and 20 grams of protein),MVI GI prophylaxis: PPI  ABG:    Component Value Date/Time   TCO2 18 (L) 06/10/2020 1026    Vent Settings: N/A  Condition - Extremely Guarded  Family Communication  :  Daughter-Brenda-[phone] at bedside on 4/20.    Code Status :  Full Code  Diet :  Diet Order            Diet  regular Room service appropriate? Yes; Fluid consistency: Thin  Diet effective now                  Disposition Plan  :   Status is: Inpatient  Remains inpatient appropriate because:Inpatient level of care appropriate due to severity of illness   Dispo: The patient is from: Home              Anticipated d/c is to: Home              Patient currently is not medically stable to d/c.   Difficult to place patient No   Barriers to discharge: Resolving encephalopathy-awaiting final culture data.  Antimicorbials  :    Anti-infectives (From admission, onward)   Start     Dose/Rate Route Frequency Ordered Stop   06/12/20 1000  remdesivir 100 mg in sodium chloride 0.9 % 100 mL IVPB        100 mg 200 mL/hr over 30 Minutes Intravenous Daily 06/11/20 0435 06/13/20 0900   06/11/20 0445  remdesivir 200 mg in sodium chloride 0.9% 250 mL IVPB        200 mg 580 mL/hr over 30 Minutes Intravenous Once 06/11/20 0434 06/11/20 0929   06/11/20 0230  vancomycin (VANCOREADY) IVPB 1500 mg/300 mL        1,500 mg 150 mL/hr over 120 Minutes Intravenous  Once 06/11/20 0229 06/11/20 0812   06/11/20 0215  ampicillin (OMNIPEN) 2 g in sodium chloride 0.9 % 100 mL IVPB        2 g 300 mL/hr over 20 Minutes Intravenous  Once 06/11/20 0157 06/11/20 0550   06/11/20 0215  vancomycin (VANCOREADY) IVPB 1000 mg/200 mL  Status:  Discontinued        1,000 mg 200 mL/hr over 60 Minutes Intravenous  Once 06/11/20 0157 06/11/20 0229   06/11/20 0200  cefTRIAXone (ROCEPHIN) 2 g in sodium chloride 0.9 % 100 mL IVPB        2 g 200 mL/hr over 30 Minutes Intravenous  Once 06/11/20 0157 06/11/20 0248      Inpatient Medications  Scheduled Meds: . amLODipine  10 mg Oral Daily  . asenapine  5 mg Sublingual BID  . aspirin EC  81 mg Oral  Daily  . atorvastatin  40 mg Oral Daily  . cyanocobalamin  1,000 mcg Subcutaneous Daily  . divalproex  250 mg Oral Q12H  . enoxaparin (LOVENOX) injection  40 mg Subcutaneous Q24H  .  feeding supplement  237 mL Oral BID BM  . insulin aspart  0-9 Units Subcutaneous TID WC  . melatonin  5 mg Oral QHS  . multivitamin with minerals  1 tablet Oral Daily  . pantoprazole  40 mg Oral Daily  . thiamine injection  100 mg Intravenous Daily   Or  . thiamine  100 mg Oral Daily   Continuous Infusions:  PRN Meds:.acetaminophen, haloperidol lactate, hydrALAZINE, ondansetron **OR** ondansetron (ZOFRAN) IV   Time Spent in minutes  25    See all Orders from today for further details   Oren Binet M.D on 06/16/2020 at 12:05 PM  To page go to [email or web address] - use universal password  [facility] -  Office  [phone]    Objective:   Vitals:   06/16/20 0023 06/16/20 0417 06/16/20 0605 06/16/20 0809  BP: (!) 149/75 (!) 127/55 (!) 147/84 (!) 155/77  Pulse: 65 60 63 66  Resp: 18 16 16 19   Temp: 98 F (36.7 C) (!) 97.5 F (36.4 C) (!) 97.4 F (36.3 C) 97.7 F (36.5 C)  TempSrc: Oral Oral Oral Oral  SpO2: 94% 91% 94% 98%  Weight:      Height:        Wt Readings from Last 3 Encounters:  06/10/20 86.2 kg  06/10/20 86.2 kg  04/19/20 86.2 kg     Intake/Output Summary (Last 24 hours) at 06/16/2020 1205 Last data filed at 06/16/2020 0836 Gross per 24 hour  Intake 240 ml  Output --  Net 240 ml     Physical Exam Gen Exam: Awake but confused. HEENT:atraumatic, normocephalic Chest: B/L clear to auscultation anteriorly CVS:S1S2 regular Abdomen:soft non tender, non distended Extremities:no edema Neurology: Non focal Skin: no rash   Data Review:    CBC Recent Labs  Lab 06/10/20 1026 06/11/20 0100 06/12/20 0455 06/13/20 0449 06/14/20 0140  WBC  --  8.4 6.1 5.2 5.6  HGB 15.3 13.1 12.1* 12.6* 12.0*  HCT 45.0 40.9 37.6* 36.9* 36.3*  PLT  --  211 169 165 164  MCV  --  88.5 86.8 84.1 86.4  MCH  --  28.4 27.9 28.7 28.6  MCHC  --  32.0 32.2 34.1 33.1  RDW  --  14.6 14.6 13.8 14.1  LYMPHSABS  --  0.7 1.5 0.9 1.5  MONOABS  --  0.9 0.5 0.3 0.4   EOSABS  --  0.0 0.1 0.0 0.1  BASOSABS  --  0.0 0.0 0.0 0.0    Chemistries  Recent Labs  Lab 06/10/20 1055 06/11/20 0100 06/12/20 0455 06/13/20 0449 06/14/20 0140 06/15/20 0204  NA 136 135 139 138 141 138  K 4.4 3.5 3.7 3.2* 3.3* 3.4*  CL 107 109 112* 108 109 105  CO2 16* 19* 23 21* 22 27  GLUCOSE 297* 89 95 108* 108* 145*  BUN 50* 43* 20 12 17 19   CREATININE 1.53* 1.49* 1.24 1.03 1.05 1.04  CALCIUM 8.7* 8.5* 8.4* 8.6* 8.7* 8.8*  MG  --   --   --   --   --  2.0  AST 29 27 40 74* 61*  --   ALT 30 26 27  38 34  --   ALKPHOS 51 42 36* 40 39  --   BILITOT 0.6 0.8 0.8  0.8 0.7  --    ------------------------------------------------------------------------------------------------------------------ No results for input(s): CHOL, HDL, LDLCALC, TRIG, CHOLHDL, LDLDIRECT in the last 72 hours.  Lab Results  Component Value Date   HGBA1C 6.6 (H) 06/11/2020   ------------------------------------------------------------------------------------------------------------------ Recent Labs    06/15/20 1212  TSH 2.580   ------------------------------------------------------------------------------------------------------------------ No results for input(s): VITAMINB12, FOLATE, FERRITIN, TIBC, IRON, RETICCTPCT in the last 72 hours.  Coagulation profile Recent Labs  Lab 06/11/20 0100  INR 1.1    No results for input(s): DDIMER in the last 72 hours.  Cardiac Enzymes No results for input(s): CKMB, TROPONINI, MYOGLOBIN in the last 168 hours.  Invalid input(s): CK ------------------------------------------------------------------------------------------------------------------    Component Value Date/Time   BNP 25.0 10/10/2017 2103    Micro Results Recent Results (from the past 240 hour(s))  Culture, blood (Routine X 2) w Reflex to ID Panel     Status: None   Collection Time: 06/10/20 11:32 AM   Specimen: BLOOD  Result Value Ref Range Status   Specimen Description BLOOD RIGHT  ANTECUBITAL  Final   Special Requests   Final    Blood Culture adequate volume BOTTLES DRAWN AEROBIC AND ANAEROBIC   Culture   Final    NO GROWTH 5 DAYS Performed at [facility], [address] [place], [place] [address]    Report Status 06/15/2020 FINAL  Final  Culture, blood (Routine X 2) w Reflex to ID Panel     Status: None   Collection Time: 06/10/20 11:33 AM   Specimen: BLOOD  Result Value Ref Range Status   Specimen Description BLOOD BLOOD RIGHT WRIST  Final   Special Requests   Final    Blood Culture adequate volume BOTTLES DRAWN AEROBIC AND ANAEROBIC   Culture   Final    NO GROWTH 5 DAYS Performed at [facility], [address], [place], [place] [address]    Report Status 06/15/2020 FINAL  Final  Urine Culture     Status: None   Collection Time: 06/10/20  1:05 PM   Specimen: Urine, Clean Catch  Result Value Ref Range Status   Specimen Description   Final    URINE, CLEAN CATCH Performed at [facility], [address] [place], [place] [address]    Special Requests   Final    NONE Performed at [facility], [address] [place], [place] [address]    Culture   Final    NO GROWTH Performed at [facility], [place] [address], [place], [place] [address]    Report Status 06/11/2020 FINAL  Final  Blood Culture (routine x 2)     Status: None   Collection Time: 06/11/20  1:00 AM   Specimen: BLOOD LEFT ARM  Result Value Ref Range Status   Specimen Description BLOOD LEFT ARM  Final   Special Requests   Final    BOTTLES DRAWN AEROBIC AND ANAEROBIC Blood Culture adequate volume   Culture   Final    NO GROWTH 5 DAYS Performed at [facility], [place] [address] [place], [place] [address]    Report Status 06/16/2020 FINAL  Final  Urine culture     Status: Abnormal   Collection Time: 06/11/20  1:49 AM   Specimen: In/Out Cath Urine  Result Value Ref Range Status   Specimen Description IN/OUT CATH URINE  Final   Special Requests   Final    NONE Performed at  [facility], [place] [address] [place], [place] [address]    Culture  MULTIPLE SPECIES PRESENT, SUGGEST RECOLLECTION (A)  Final   Report Status 06/12/2020 FINAL  Final  Blood Culture (routine x 2)     Status: None   Collection Time: 06/11/20  2:00 AM   Specimen: BLOOD RIGHT ARM  Result Value Ref Range Status   Specimen Description BLOOD RIGHT ARM  Final   Special Requests   Final    BOTTLES DRAWN AEROBIC AND ANAEROBIC Blood Culture adequate volume   Culture   Final    NO GROWTH 5 DAYS Performed at [facility], [address] [place], [place] [address]    Report Status 06/16/2020 FINAL  Final  Resp Panel by RT-PCR (Flu A&B, Covid) Nasopharyngeal Swab     Status: Abnormal   Collection Time: 06/11/20  2:29 AM   Specimen: Nasopharyngeal Swab; Nasopharyngeal(NP) swabs in vial transport medium  Result Value Ref Range Status   SARS Coronavirus 2 by RT PCR POSITIVE (A) NEGATIVE Final    Comment: RESULT CALLED TO, READ BACK BY AND VERIFIED WITH: K MOON RN 06/11/20 0411 JDW (NOTE) SARS-CoV-2 target nucleic acids are DETECTED.  The SARS-CoV-2 RNA is generally detectable in upper respiratory specimens during the acute phase of infection. Positive results are indicative of the presence of the identified virus, but do not rule out bacterial infection or co-infection with other pathogens not detected by the test. Clinical correlation with patient history and other diagnostic information is necessary to determine patient infection status. The expected result is Negative.  Fact Sheet for Patients: EntrepreneurPulse.com.au  Fact Sheet for Healthcare Providers: IncredibleEmployment.be  This test is not yet approved or cleared by the [place] FDA and  has been authorized for detection and/or diagnosis of SARS-CoV-2 by FDA under an Emergency Use Authorization (EUA).  This EUA will remain in effect (meaning this test can be used)  for the  duration of  the COVID-19 declaration under Section 564(b)(1) of the Act, 21 U.S.C. section 360bbb-3(b)(1), unless the authorization is terminated or revoked sooner.     Influenza A by PCR NEGATIVE NEGATIVE Final   Influenza B by PCR NEGATIVE NEGATIVE Final    Comment: (NOTE) The Xpert Xpress SARS-CoV-2/FLU/RSV plus assay is intended as an aid in the diagnosis of influenza from Nasopharyngeal swab specimens and should not be used as a sole basis for treatment. Nasal washings and aspirates are unacceptable for Xpert Xpress SARS-CoV-2/FLU/RSV testing.  Fact Sheet for Patients: EntrepreneurPulse.com.au  Fact Sheet for Healthcare Providers: IncredibleEmployment.be  This test is not yet approved or cleared by the [place] FDA and has been authorized for detection and/or diagnosis of SARS-CoV-2 by FDA under an Emergency Use Authorization (EUA). This EUA will remain in effect (meaning this test can be used) for the duration of the COVID-19 declaration under Section 564(b)(1) of the Act, 21 U.S.C. section 360bbb-3(b)(1), unless the authorization is terminated or revoked.  Performed at [facility], [place] [address] [place], [place] [address]   CSF culture     Status: None   Collection Time: 06/11/20  5:20 AM   Specimen: CSF; Cerebrospinal Fluid  Result Value Ref Range Status   Specimen Description CSF  Final   Special Requests NONE  Final   Gram Stain NO WBC SEEN NO ORGANISMS SEEN CYTOSPIN SMEAR   Final   Culture   Final    NO GROWTH 3 DAYS Performed at [facility], [place] [address], [place], [place] [address]    Report Status 06/14/2020 FINAL  Final  Hsv Culture And Typing     Status: None   Collection Time: 06/11/20  5:20 AM   Specimen: Back; Cerebrospinal Fluid  Result Value Ref Range Status   HSV Culture/Type Comment  Final    Comment: (NOTE) Negative No Herpes simplex virus isolated. Performed At: [facility] [place], [place] [id number] Rush Farmer MD VZ:[id number]    Source of Sample CSF  Final    Comment: Performed at [facility], [place] [address], [place], [place] [address]    Radiology Reports CT Head Wo Contrast  Result Date: 06/10/2020 CLINICAL DATA:  Head and C-spine injury, fall, altered mental status EXAM: CT HEAD WITHOUT CONTRAST CT CERVICAL SPINE WITHOUT CONTRAST TECHNIQUE: Multidetector CT imaging of the head and cervical spine was performed following the standard protocol without intravenous contrast. Multiplanar CT image reconstructions of the cervical spine were also generated. COMPARISON:  05/04/2020 FINDINGS: CT HEAD FINDINGS Brain: Stable mild brain atrophy pattern. No acute intracranial hemorrhage, new mass lesion, acute infarction, midline shift, herniation, hydrocephalus, or extra-axial fluid collection. No focal mass effect or edema. Cisterns are patent. Cerebellar atrophy as well. Vascular: Intracranial atherosclerosis at the skull base. No hyperdense vessel. Skull: Normal. Negative for fracture or focal lesion. Sinuses/Orbits: Minor scattered sinus mucosal thickening as before. Orbits are symmetric. No acute finding. Other: None. CT CERVICAL SPINE FINDINGS Alignment: Normal. Skull base and vertebrae: No acute fracture. No primary bone lesion or focal pathologic process. Soft tissues and spinal canal: Normal prevertebral soft tissues. No hemorrhage or hematoma appreciated. Carotid atherosclerosis. Disc levels: Similar mild multilevel degenerative disc disease and facet arthropathy. Facets are aligned. No subluxation or dislocation. Preserved vertebral body heights. Upper chest: Negative. Other: None. IMPRESSION: Stable brain atrophy pattern. No acute intracranial abnormality or interval change by noncontrast CT. Stable mild cervical degenerative changes as above. No acute osseous finding or malalignment by CT. Electronically Signed   By: Jerilynn Mages.  Shick M.D.    On: 06/10/2020 12:31   CT Cervical Spine Wo Contrast  Result Date: 06/10/2020 CLINICAL DATA:  Head and C-spine injury, fall, altered mental status EXAM: CT HEAD WITHOUT CONTRAST CT CERVICAL SPINE WITHOUT CONTRAST TECHNIQUE: Multidetector CT imaging of the head and cervical spine was performed following the standard protocol without intravenous contrast. Multiplanar CT image reconstructions of the cervical spine were also generated. COMPARISON:  05/04/2020 FINDINGS: CT HEAD FINDINGS Brain: Stable mild brain atrophy pattern. No acute intracranial hemorrhage, new mass lesion, acute infarction, midline shift, herniation, hydrocephalus, or extra-axial fluid collection. No focal mass effect or edema. Cisterns are patent. Cerebellar atrophy as well. Vascular: Intracranial atherosclerosis at the skull base. No hyperdense vessel. Skull: Normal. Negative for fracture or focal lesion. Sinuses/Orbits: Minor scattered sinus mucosal thickening as before. Orbits are symmetric. No acute finding. Other: None. CT CERVICAL SPINE FINDINGS Alignment: Normal. Skull base and vertebrae: No acute fracture. No primary bone lesion or focal pathologic process. Soft tissues and spinal canal: Normal prevertebral soft tissues. No hemorrhage or hematoma appreciated. Carotid atherosclerosis. Disc levels: Similar mild multilevel degenerative disc disease and facet arthropathy. Facets are aligned. No subluxation or dislocation. Preserved vertebral body heights. Upper chest: Negative. Other: None. IMPRESSION: Stable brain atrophy pattern. No acute intracranial abnormality or interval change by noncontrast CT. Stable mild cervical degenerative changes as above. No acute osseous finding or malalignment by CT. Electronically Signed   By: Jerilynn Mages.  Shick M.D.   On: 06/10/2020 12:31   MR Brain Wo Contrast (neuro protocol)  Result Date: 06/10/2020 CLINICAL  DATA:  Acute neuro deficit.  Mental status change. EXAM: MRI HEAD WITHOUT CONTRAST TECHNIQUE:  Multiplanar, multiecho pulse sequences of the brain and surrounding structures were obtained without intravenous contrast. COMPARISON:  CT head 06/10/2020 FINDINGS: Brain: Ventricle size and cerebral volume normal for age. Negative for acute infarct. No significant chronic ischemic change. Negative for hemorrhage or mass. Image quality degraded by mild motion. Vascular: Partial loss of flow void in the left internal carotid artery through the skull base and the distal left vertebral artery which may be due to slow flow or stenosis. Skull and upper cervical spine: Negative Sinuses/Orbits: Mild mucosal edema paranasal sinuses. No orbital mass. Bilateral exophthalmos. Other: None IMPRESSION: Negative for acute infarct. Partial loss of flow void in the left internal carotid artery distal left vertebral artery which could be due to proximal stenosis. Consider CT angio head and neck for further evaluation. Electronically Signed   By: Franchot Gallo M.D.   On: 06/10/2020 15:09   DG Chest Port 1 View  Result Date: 06/11/2020 CLINICAL DATA:  Questionable sepsis. EXAM: PORTABLE CHEST 1 VIEW COMPARISON:  June 10, 2020 FINDINGS: There is no evidence of acute infiltrate, pleural effusion or pneumothorax. The heart size and mediastinal contours are within normal limits. A radiopaque loop recorder device is seen. The visualized skeletal structures are unremarkable. IMPRESSION: No active disease. Electronically Signed   By: Virgina Norfolk M.D.   On: 06/11/2020 03:10   DG Chest Port 1 View  Result Date: 06/10/2020 CLINICAL DATA:  Altered mental status. EXAM: PORTABLE CHEST 1 VIEW COMPARISON:  May 25, 2020 FINDINGS: Loop recorder in place. Cardiomediastinal silhouette is normal. Mediastinal contours appear intact. Tortuosity and calcific atherosclerotic disease of the aorta. There is no evidence of focal airspace consolidation, pleural effusion or pneumothorax. Osseous structures are without acute abnormality. Soft  tissues are grossly normal. IMPRESSION: No active disease. Electronically Signed   By: Fidela Salisbury M.D.   On: 06/10/2020 11:15   EEG adult  Result Date: 06/12/2020 Lora Havens, MD     06/12/2020  3:07 PM Patient Name: Thorne Wirz MRN: [id number] Epilepsy Attending: Lora Havens Referring Physician/Provider: Dr Oren Binet Date: 06/12/2020 Duration: 25.42 mins Patient history: 80yo M with ams. EEG to evaluate for seizure Level of alertness: Awake AEDs during EEG study: None Technical aspects: This EEG study was done with scalp electrodes positioned according to the 10-20 International system of electrode placement. Electrical activity was acquired at a sampling rate of 500Hz  and reviewed with a high frequency filter of 70Hz  and a low frequency filter of 1Hz . EEG data were recorded continuously and digitally stored. Description: No posterior dominant rhythm was seen. EEG showed continuous generalized 5 to 6 Hz theta slowing. Hyperventilation and photic stimulation were not performed.   ABNORMALITY - Continuous slow, generalized IMPRESSION: This study is suggestive of mild to moderate diffuse encephalopathy, nonspecific etiology. No seizures or epileptiform discharges were seen throughout the recording. Lora Havens   CUP PACEART REMOTE DEVICE CHECK  Result Date: 05/22/2020 ILR summary report received. Battery status OK. Normal device function. No new symptom, tachy, brady, or pause episodes. No new AF episodes. Monthly summary reports and ROV/PRN.  R. Powers, CVRS  DG Hips Bilat W or Wo Pelvis 3-4 Views  Result Date: 06/10/2020 CLINICAL DATA:  Found on side of road, last seen normal at 2130 hours last night, fall, dementia, tenderness of hips bilaterally EXAM: DG HIP (WITH OR WITHOUT PELVIS) 3-4V BILAT COMPARISON:  None FINDINGS: Osseous demineralization.  Hip and SI joint spaces preserved. No acute fracture, dislocation, or bone destruction. Mild degenerative disc disease changes at  visualized lower lumbar spine. IMPRESSION: No acute osseous abnormalities. Electronically Signed   By: Lavonia Dana M.D.   On: 06/10/2020 11:48

## 2020-06-16 NOTE — Progress Notes (Addendum)
Pt resting in chair with alarm and easy release belt. Medications given in apple sauce.  Pt had some difficulty swallowing whole medications.  Pt upright for medication administration.   Spoke with pt's daughter [name].  Updated her on pts current status.    Contacted pharmacy for a replacement saphris, however pt was sleepy and did not want to be bothered. MD Rathore notified.    0415 Pt responding only to vigorous stimuli or pain.  Not following commands. Vitals WDL. MD Rathore notified of status via page.

## 2020-06-16 NOTE — Progress Notes (Signed)
Physical Therapy Treatment Patient Details Name: Patrick Phillips MRN: [id number] DOB: 03-18-1940 Today's Date: 06/16/2020    History of Present Illness 80 y.o. male with presented with fever and confusion. Pt with dementia however still highly functional, took dog for a walk and dog came back without him. Pt later found lying on the side of road with no memory of what had happened but able to follow commands, taken to [facility] where MRI was negative for stroke and x-ray negative for fx. D/c home with daughter but pt with increasing confusion pt then brought  to [facility] ED 06/10/20. Pt with increasing confusion and agitation. Pt found to be febrile and COVID+. Admitted for acute metabolic encephalopathy.    PMHx of vascular dementia, prior CVA, DM-2, HTN    PT Comments    Pt asleep in recliner on entry, requires increased verbal and tactile stimulation to rouse. Once eyes open able to answer a few questions about breakfast and his dog "Patrick Phillips". Initiated seated LE exercise, however pt nodding off during activity. Unable to ambulate today due to lethargy. D/c plans remain appropriate if 24 hour assist available, otherwise pt with SNF level care. PT will continue to follow acutely.    Follow Up Recommendations  Home health PT;Supervision/Assistance - 24 hour     Equipment Recommendations  None recommended by PT       Precautions / Restrictions Precautions Precautions: Fall Precaution Comments: found down along side of road Restrictions Weight Bearing Restrictions: No    Mobility  Bed Mobility               General bed mobility comments: OOB in recliner                            Balance Overall balance assessment: Needs assistance Sitting-balance support: Feet supported;No upper extremity supported Sitting balance-Leahy Scale: Fair                                      Cognition Arousal/Alertness: Lethargic Behavior During Therapy: WFL for tasks  assessed/performed Overall Cognitive Status: History of cognitive impairments - at baseline Area of Impairment: Orientation;Attention;Memory;Following commands;Safety/judgement;Awareness;Problem solving                 Orientation Level: Disoriented to;Time;Place;Situation Current Attention Level: Selective Memory: Decreased short-term memory Following Commands: Follows multi-step commands with increased time;Follows one step commands consistently Safety/Judgement: Decreased awareness of deficits Awareness: Emergent Problem Solving: Requires verbal cues;Requires tactile cues General Comments: pt only answers a few questions about breakfast and his dog before closing his eyes again      Exercises Total Joint Exercises Hip ABduction/ADduction: AROM;10 reps;Standing;Seated;5 reps;AAROM Long Arc Quad: AROM;AAROM;Both;10 reps;Seated Marching in Standing: AROM;Both;10 reps;Standing General Exercises - Lower Extremity Ankle Circles/Pumps: AROM;Both;10 reps;Seated    General Comments General comments (skin integrity, edema, etc.): No family present      Pertinent Vitals/Pain Pain Assessment: Faces Faces Pain Scale: No hurt           PT Goals (current goals can now be found in the care plan section) Acute Rehab PT Goals Patient Stated Goal: go home to [place] his dog PT Goal Formulation: With patient/family Time For Goal Achievement: 06/26/20 Potential to Achieve Goals: Good Progress towards PT goals: Not progressing toward goals - comment (very lethargic today)    Frequency    Min 3X/week  PT Plan Discharge plan needs to be updated;Current plan remains appropriate       AM-PAC PT "6 Clicks" Mobility   Outcome Measure  Help needed turning from your back to your side while in a flat bed without using bedrails?: None Help needed moving from lying on your back to sitting on the side of a flat bed without using bedrails?: None Help needed moving to and from a bed  to a chair (including a wheelchair)?: A Little Help needed standing up from a chair using your arms (e.g., wheelchair or bedside chair)?: A Little Help needed to walk in hospital room?: A Little Help needed climbing 3-5 steps with a railing? : A Little 6 Click Score: 20    End of Session   Activity Tolerance: Patient limited by fatigue Patient left: with call bell/phone within reach;in chair;Other (comment) (Posey belt on) Nurse Communication: Mobility status PT Visit Diagnosis: Unsteadiness on feet (R26.81);Other abnormalities of gait and mobility (R26.89);Muscle weakness (generalized) (M62.81);Difficulty in walking, not elsewhere classified (R26.2)     Time: 3174-0992 PT Time Calculation (min) (ACUTE ONLY): 12 min  Charges:  $Therapeutic Exercise: 8-22 mins                     Calise Dunckel B. Migdalia Dk PT, DPT Acute Rehabilitation Services Pager [phone] Office [phone]    [place] 06/16/2020, 9:51 AM

## 2020-06-16 NOTE — Consult Note (Signed)
  Practitioner attempted to assess patient however he was sleeping. Writer made multiple attempts to awake patient with no avail. He was noted to have equal rise and fall of his chest, no acute or obvious distress, and was observed to be sitting in his chair. Chart review indicates he was agitated yesterday requiring ativan 1mg , which resulted in some sedation. Primary team has requested we continue to follow this patient. We have ordered Valproic Acid Level for tomorrow morning, will adjust Depakote accordingly. WIll continue to monitor at this time.

## 2020-06-16 NOTE — Progress Notes (Signed)
   06/16/20 0605  Assess: MEWS Score  Temp (!) 97.4 F (36.3 C)  BP (!) 147/84  Pulse Rate 63  Resp 16  Level of Consciousness Responds to Voice  Assess: MEWS Score  MEWS Temp 0  MEWS Systolic 0  MEWS Pulse 0  MEWS RR 0  MEWS LOC 1  MEWS Score 1  MEWS Score Color Green  Assess: if the MEWS score is Yellow or Red  Were vital signs taken at a resting state? Yes  Focused Assessment No change from prior assessment  Early Detection of Sepsis Score *See Row Information* Low  MEWS guidelines implemented *See Row Information* Yes  Treat  MEWS Interventions Escalated (See documentation below)  Pain Scale PAINAD  Breathing 0  Negative Vocalization 0  Facial Expression 0  Body Language 0  Consolability 0  PAINAD Score 0  Take Vital Signs  Increase Vital Sign Frequency  Yellow: Q 2hr X 2 then Q 4hr X 2, if remains yellow, continue Q 4hrs  Escalate  MEWS: Escalate Yellow: discuss with charge nurse/RN and consider discussing with provider and RRT  Notify: Charge Nurse/RN  Name of Charge Nurse/RN Notified Isaiah,RN  Notify: Provider  Provider Name/Title Ninetta Lights MD  Date Provider Notified 06/16/20  Time Provider Notified [phone]  Notification Type Page  Notification Reason Change in status  Provider response Other (Comment) (continue to monitor)  Date of Provider Response 06/16/20  Time of Provider Response [phone]  Document  Patient Outcome Other (Comment) (condition improving)  Progress note created (see row info) Yes

## 2020-06-16 NOTE — Progress Notes (Signed)
   06/16/20 0417  Assess: MEWS Score  Temp (!) 97.5 F (36.4 C)  BP (!) 127/55  Pulse Rate 60  Resp 16  Level of Consciousness Responds to Pain  SpO2 91 %  O2 Device Room Air  Patient Activity (if Appropriate) In bed  Assess: MEWS Score  MEWS Temp 0  MEWS Systolic 0  MEWS Pulse 0  MEWS RR 0  MEWS LOC 2  MEWS Score 2  MEWS Score Color Yellow  Assess: if the MEWS score is Yellow or Red  Were vital signs taken at a resting state? Yes  Focused Assessment Change from prior assessment (see assessment flowsheet)  Early Detection of Sepsis Score *See Row Information* Low  MEWS guidelines implemented *See Row Information* Yes  Treat  MEWS Interventions Escalated (See documentation below)  Pain Scale PAINAD  Take Vital Signs  Increase Vital Sign Frequency  Yellow: Q 2hr X 2 then Q 4hr X 2, if remains yellow, continue Q 4hrs  Escalate  MEWS: Escalate Yellow: discuss with charge nurse/RN and consider discussing with provider and RRT  Notify: Charge Nurse/RN  Name of Charge Nurse/RN Notified Isaiah  Date Charge Nurse/RN Notified 06/16/20  Time Charge Nurse/RN Notified [id number]  Notify: Provider  Provider Name/Title Ninetta Lights MD  Date Provider Notified 06/16/20  Time Provider Notified 0430  Notification Type Page  Notification Reason Change in status  Provider response See new orders (Monitor)  Date of Provider Response 06/16/20  Time of Provider Response [phone]  Document  Patient Outcome Not stable and remains on department  Progress note created (see row info) Yes

## 2020-06-17 DIAGNOSIS — F0151 Vascular dementia with behavioral disturbance: Secondary | ICD-10-CM | POA: Diagnosis not present

## 2020-06-17 DIAGNOSIS — E119 Type 2 diabetes mellitus without complications: Secondary | ICD-10-CM | POA: Diagnosis not present

## 2020-06-17 DIAGNOSIS — I10 Essential (primary) hypertension: Secondary | ICD-10-CM | POA: Diagnosis not present

## 2020-06-17 LAB — GLUCOSE, CAPILLARY
Glucose-Capillary: 148 mg/dL — ABNORMAL HIGH (ref 70–99)
Glucose-Capillary: 221 mg/dL — ABNORMAL HIGH (ref 70–99)
Glucose-Capillary: 190 mg/dL — ABNORMAL HIGH (ref 70–99)
Glucose-Capillary: 137 mg/dL — ABNORMAL HIGH (ref 70–99)

## 2020-06-17 LAB — VALPROIC ACID LEVEL: Valproic Acid Lvl: 29 ug/mL — ABNORMAL LOW (ref 50.0–100.0)

## 2020-06-17 MED ORDER — CARVEDILOL 3.125 MG PO TABS
3.1250 mg | ORAL_TABLET | Freq: Two times a day (BID) | ORAL | Status: DC
  Administered 2020-06-17 – 2020-06-20 (×6): 3.125 mg via ORAL
  Filled 2020-06-17 (×7): qty 1

## 2020-06-17 MED ORDER — LORAZEPAM 0.5 MG PO TABS
0.5000 mg | ORAL_TABLET | Freq: Every day | ORAL | Status: DC
  Administered 2020-06-17 – 2020-06-18 (×2): 0.5 mg via ORAL
  Filled 2020-06-17 (×2): qty 1

## 2020-06-17 NOTE — Care Management Important Message (Signed)
Important Message  Patient Details  Name: Nathon Stefanski MRN: [id number] Date of Birth: 1940-07-19   Medicare Important Message Given:  Yes - Important Message mailed due to current National Emergency  Verbal consent obtained due to current National Emergency  Relationship to patient: Self Contact Name: Coden Call Date: 06/17/20  Time: 4818 Phone: [phone] Outcome: No Answer/Busy Important Message mailed to: Patient address on file    Delorse Lek 06/17/2020, 2:39 PM

## 2020-06-17 NOTE — Progress Notes (Signed)
Occupational Therapy Treatment Patient Details Name: Patrick Phillips MRN: [id number] DOB: February 14, 1941 Today's Date: 06/17/2020    History of present illness 80 y.o. male with presented with fever and confusion. Pt with dementia however still highly functional, took dog for a walk and dog came back without him. Pt later found lying on the side of road with no memory of what had happened but able to follow commands, taken to [facility] where MRI was negative for stroke and x-ray negative for fx. D/c home with daughter but pt with increasing confusion pt then brought  to [facility] ED 06/10/20. Pt with increasing confusion and agitation. Pt found to be febrile and COVID+. Admitted for acute metabolic encephalopathy.    PMHx of vascular dementia, prior CVA, DM-2, HTN   OT comments  Pt with gradual progress towards OT goals. On entry, pt requesting to get out of bed. Guided pt in mobility to/from sink at [place] with handheld assist, cues needed for safe sequencing and avoiding crossing feet. Pt able to stand at sink for ADLs at min guard with cues for initiating tasks needed. Pt able to demonstrate ability to don socks sitting EOB with increased time. Pt limited by lethargy today, possibly due to medications. RN present at end of session for med administration, so restraints left off. DC recs remain appropriate.    Follow Up Recommendations  Home health OT;Supervision/Assistance - 24 hour (SNF if 24/7 not available)    Equipment Recommendations  Tub/shower seat    Recommendations for Other Services      Precautions / Restrictions Precautions Precautions: Fall Precaution Comments: found down along side of road Restrictions Weight Bearing Restrictions: No       Mobility Bed Mobility Overal bed mobility: Needs Assistance Bed Mobility: Supine to Sit;Sit to Supine     Supine to sit: Min assist Sit to supine: Min guard   General bed mobility comments: Min A to get trunk to EOB with handheld assist     Transfers Overall transfer level: Needs assistance Equipment used: 1 person hand held assist Transfers: Sit to/from Stand Sit to Stand: Min assist         General transfer comment: min A for safety, requires increased time to get fully upright    Balance Overall balance assessment: Needs assistance Sitting-balance support: Feet supported;No upper extremity supported Sitting balance-Leahy Scale: Fair     Standing balance support: No upper extremity supported;Bilateral upper extremity supported;During functional activity Standing balance-Leahy Scale: Poor Standing balance comment: requires outside assist to maintain balance when not using UE support                           ADL either performed or assessed with clinical judgement   ADL Overall ADL's : Needs assistance/impaired Eating/Feeding: Set up;Sitting Eating/Feeding Details (indicate cue type and reason): setup sitting in bed for breakfast, assist to cut up food Grooming: Min guard;Standing;Wash/dry face Grooming Details (indicate cue type and reason): consistent cues needed to sequence and initiate tasks             Lower Body Dressing: Minimal assistance Lower Body Dressing Details (indicate cue type and reason): likely will need assist in standing but able to don socks with increased time bending to feet             Functional mobility during ADLs: Minimal assistance;Cueing for safety;Cueing for sequencing General ADL Comments: Pt lethargic, had ativan last night but asking to get out  of bed. able to guide pt in mobility with handheld assist, slower response time noted today.     Vision   Vision Assessment?: No apparent visual deficits   Perception     Praxis      Cognition Arousal/Alertness: Lethargic;Suspect due to medications Behavior During Therapy: Flat affect Overall Cognitive Status: History of cognitive impairments - at baseline Area of Impairment:  Orientation;Attention;Memory;Following commands;Safety/judgement;Awareness;Problem solving                 Orientation Level: Disoriented to;Time;Place;Situation Current Attention Level: Selective Memory: Decreased short-term memory Following Commands: Follows multi-step commands with increased time;Follows one step commands consistently Safety/Judgement: Decreased awareness of deficits Awareness: Emergent Problem Solving: Requires verbal cues;Requires tactile cues General Comments: reports he is in [place] but unable to report being at hospital. follows one step commands consistently. cues for safety/sequencing needed throughout. pt lethargic, had received ativan last night        Exercises     Shoulder Instructions       General Comments RN present at end of session preparing med administration, restraints left off while RN in room.    Pertinent Vitals/ Pain       Pain Assessment: Faces Faces Pain Scale: No hurt Pain Intervention(s): Monitored during session  Home Living                                          Prior Functioning/Environment              Frequency  Min 2X/week        Progress Toward Goals  OT Goals(current goals can now be found in the care plan section)  Progress towards OT goals: Progressing toward goals  Acute Rehab OT Goals Patient Stated Goal: go home to [place] his dog OT Goal Formulation: With patient/family Time For Goal Achievement: 06/28/20 Potential to Achieve Goals: Good ADL Goals Pt Will Perform Grooming: with supervision;with set-up;standing;with caregiver independent in assisting Pt Will Perform Upper Body Bathing: with supervision;with set-up;sitting;with caregiver independent in assisting Pt Will Perform Lower Body Bathing: with min assist;with min guard assist;with caregiver independent in assisting;sit to/from stand Pt Will Perform Upper Body Dressing: with supervision;with set-up;sitting;with  caregiver independent in assisting Pt Will Perform Lower Body Dressing: with min assist;with min guard assist;sit to/from stand;with caregiver independent in assisting Pt Will Transfer to Toilet: with supervision;ambulating Pt Will Perform Toileting - Clothing Manipulation and hygiene: with min guard assist;with supervision;with caregiver independent in assisting;sit to/from stand Pt Will Perform Tub/Shower Transfer: with min guard assist;with supervision;ambulating;with caregiver independent in assisting;shower seat  Plan Discharge plan remains appropriate    Co-evaluation                 AM-PAC OT "6 Clicks" Daily Activity     Outcome Measure   Help from another person eating meals?: None Help from another person taking care of personal grooming?: A Little Help from another person toileting, which includes using toliet, bedpan, or urinal?: A Lot Help from another person bathing (including washing, rinsing, drying)?: A Lot Help from another person to put on and taking off regular upper body clothing?: A Little Help from another person to put on and taking off regular lower body clothing?: A Little 6 Click Score: 17    End of Session Equipment Utilized During Treatment: Gait belt  OT Visit Diagnosis: Unsteadiness on feet (R26.81);Other abnormalities of  gait and mobility (R26.89);History of falling (Z91.81);Muscle weakness (generalized) (M62.81);Other symptoms and signs involving cognitive function;Pain   Activity Tolerance Patient limited by lethargy   Patient Left in bed;with call bell/phone within reach;with bed alarm set   Nurse Communication Mobility status        Time: 0923-3007 OT Time Calculation (min): 27 min  Charges: OT General Charges $OT Visit: 1 Visit OT Treatments $Self Care/Home Management : 23-37 mins  Malachy Chamber, OTR/L Acute Rehab Services Office: [phone]   Layla Maw 06/17/2020, 10:24 AM

## 2020-06-17 NOTE — Progress Notes (Signed)
PROGRESS NOTE                                                                                                                                                                                                             Patient Demographics:    Patrick Phillips, is a 80 y.o. male, DOB - 1940/05/11, MCN:[id number]  Outpatient Primary MD for the patient is [name], MD   Admit date - 06/10/2020   LOS - 6  Chief Complaint  Patient presents with  . Altered Mental Status       Brief Narrative: Patient is a 80 y.o. male with PMHx of vascular dementia, prior CVA, DM-2, HTN-who presented with fever and confusion.  COVID-19 vaccinated status: Unknown  Significant Events: 4/14>> Admit to [facility] for fever/confusion  Significant studies: 4/14>>Chest x-ray: No PNA 4/14>> CT head: No acute intracranial abnormality. 4/14>> CT C-spine: No fracture 4/14>> MRI brain: No acute infarct. 4/15>> chest x-ray: No PNA 4/15>> CSF: WBC 1 (tube #4), protein 46, glucose 54 4/16>> EEG: Mild to moderate diffuse encephalopathy-no seizures.  COVID-19 medications: Remdesivir: 4/15>>  Antibiotics: Vancomycin: 4/14 x1 Ceftriaxone: 4/14 x 1 Ampicillin: 4/14 x 1  Microbiology data: 4/14 >>blood culture: No growth 4/14>> urine culture: No growth 4/15>> blood culture: No growth 4/15>> urine culture: Multiple species 4/15>> CSF culture: No growth 4/15>> CSF PCR HSV 1/2: Negative. 4/15>> CSF HSV culture: Negative 4/15>> CSF arbovirus IgG: Pending 4/15>> CSF VDRL: Negative  Procedures: None  Consults: , Neurology, psychiatry  DVT prophylaxis: enoxaparin (LOVENOX) injection 40 mg Start: 06/11/20 0600    Subjective:   Sleeping comfortably this morning-awoke-and answers simple questions-he is still confused-he had another episode of delirium yesterday evening.   Assessment  & Plan :   Acute metabolic encephalopathy-superimposed on  dementia with delirium: Encephalopathy on presentation was likely related to febrile illness/COVID-19 infection-all culture data negative so far.  Neuroimaging as above.  EEG negative for seizures.  He has had no fever since admission-his encephalopathy that he had on admission has resolved-I suspect his confusion/agitation and now is related to delirium associated with dementia.  Evaluated by psychiatry and started on Saphris and Depakote-since he continues to have episodes of breakthrough delirium-psychiatry has added Ativan nightly.  Psychiatry continues to follow for medical optimization.  SIRS-COVID-19 infection: Completed Remdesivir x3 days.  Not hypoxic-no pneumonia-does not  require any further therapy.  Interestingly-patient apparently had COVID-19 infection in February as well.  No results for input(s): DDIMER, FERRITIN, LDH, CRP in the last 72 hours.  Lab Results  Component Value Date   [place] (A) 06/11/2020   [place] NEGATIVE 01/13/2020    AKI: Mild-likely hemodynamically mediated-resolved with IV fluids.  Borderline vitamin B12 deficiency: On supplementation.  Hypokalemia: Repleted-recheck periodically.  HTN: BP remains elevated-add low-dose Coreg-continue amlodipine.  Follow and optimize.    DM-2: CBGs relatively stable-continue SSI-oral hypoglycemic agents on hold  Recent Labs    06/16/20 2023 06/17/20 0812 06/17/20 1205  GLUCAP 160* 148* 221*   History of CVA-MRI brain negative for infarct-on aspirin/statin.  Dementia: On Namenda  GERD: On PPI  Interventions: Ensure Enlive (each supplement provides 350kcal and 20 grams of protein),MVI GI prophylaxis: PPI  ABG:    Component Value Date/Time   TCO2 18 (L) 06/10/2020 1026    Vent Settings: N/A  Condition - Extremely Guarded  Family Communication  :  Daughter-Brenda-[phone] at bedside on 4/20.    Code Status :  Full Code  Diet :  Diet Order            Diet regular Room service  appropriate? Yes; Fluid consistency: Thin  Diet effective now                  Disposition Plan  :   Status is: Inpatient  Remains inpatient appropriate because:Inpatient level of care appropriate due to severity of illness   Dispo: The patient is from: Home              Anticipated d/c is to: Home              Patient currently is not medically stable to d/c.   Difficult to place patient No   Barriers to discharge: Resolving encephalopathy-awaiting final culture data.  Antimicorbials  :    Anti-infectives (From admission, onward)   Start     Dose/Rate Route Frequency Ordered Stop   06/12/20 1000  remdesivir 100 mg in sodium chloride 0.9 % 100 mL IVPB        100 mg 200 mL/hr over 30 Minutes Intravenous Daily 06/11/20 0435 06/13/20 0900   06/11/20 0445  remdesivir 200 mg in sodium chloride 0.9% 250 mL IVPB        200 mg 580 mL/hr over 30 Minutes Intravenous Once 06/11/20 0434 06/11/20 0929   06/11/20 0230  vancomycin (VANCOREADY) IVPB 1500 mg/300 mL        1,500 mg 150 mL/hr over 120 Minutes Intravenous  Once 06/11/20 0229 06/11/20 0812   06/11/20 0215  ampicillin (OMNIPEN) 2 g in sodium chloride 0.9 % 100 mL IVPB        2 g 300 mL/hr over 20 Minutes Intravenous  Once 06/11/20 0157 06/11/20 0550   06/11/20 0215  vancomycin (VANCOREADY) IVPB 1000 mg/200 mL  Status:  Discontinued        1,000 mg 200 mL/hr over 60 Minutes Intravenous  Once 06/11/20 0157 06/11/20 0229   06/11/20 0200  cefTRIAXone (ROCEPHIN) 2 g in sodium chloride 0.9 % 100 mL IVPB        2 g 200 mL/hr over 30 Minutes Intravenous  Once 06/11/20 0157 06/11/20 0248      Inpatient Medications  Scheduled Meds: . amLODipine  10 mg Oral Daily  . asenapine  5 mg Sublingual BID  . aspirin EC  81 mg Oral Daily  . atorvastatin  40  mg Oral Daily  . cyanocobalamin  1,000 mcg Subcutaneous Daily  . divalproex  250 mg Oral Q12H  . enoxaparin (LOVENOX) injection  40 mg Subcutaneous Q24H  . feeding supplement  237 mL  Oral BID BM  . insulin aspart  0-9 Units Subcutaneous TID WC  . LORazepam  0.5 mg Oral QHS  . melatonin  5 mg Oral QHS  . multivitamin with minerals  1 tablet Oral Daily  . pantoprazole  40 mg Oral Daily  . thiamine injection  100 mg Intravenous Daily   Or  . thiamine  100 mg Oral Daily   Continuous Infusions:  PRN Meds:.acetaminophen, haloperidol lactate, hydrALAZINE, ondansetron **OR** ondansetron (ZOFRAN) IV   Time Spent in minutes  25    See all Orders from today for further details   Oren Binet M.D on 06/17/2020 at 3:05 PM  To page go to [email or web address] - use universal password  [facility] -  Office  [phone]    Objective:   Vitals:   06/17/20 0353 06/17/20 0506 06/17/20 0809 06/17/20 1202  BP: (!) 174/72 (!) 145/67 (!) 182/79 (!) 165/85  Pulse: 67  77 73  Resp: 16  20 17   Temp: 97.8 F (36.6 C)  97.6 F (36.4 C) 97.6 F (36.4 C)  TempSrc: Axillary  Axillary Oral  SpO2: 96%  90% 94%  Weight:      Height:        Wt Readings from Last 3 Encounters:  06/10/20 86.2 kg  06/10/20 86.2 kg  04/19/20 86.2 kg     Intake/Output Summary (Last 24 hours) at 06/17/2020 1505 Last data filed at 06/17/2020 0820 Gross per 24 hour  Intake --  Output 1300 ml  Net -1300 ml     Physical Exam Gen Exam: Confused-but not in any distress. HEENT:atraumatic, normocephalic Chest: B/L clear to auscultation anteriorly CVS:S1S2 regular Abdomen:soft non tender, non distended Extremities:no edema Neurology: Non focal Skin: no rash   Data Review:    CBC Recent Labs  Lab 06/11/20 0100 06/12/20 0455 06/13/20 0449 06/14/20 0140  WBC 8.4 6.1 5.2 5.6  HGB 13.1 12.1* 12.6* 12.0*  HCT 40.9 37.6* 36.9* 36.3*  PLT 211 169 165 164  MCV 88.5 86.8 84.1 86.4  MCH 28.4 27.9 28.7 28.6  MCHC 32.0 32.2 34.1 33.1  RDW 14.6 14.6 13.8 14.1  LYMPHSABS 0.7 1.5 0.9 1.5  MONOABS 0.9 0.5 0.3 0.4  EOSABS 0.0 0.1 0.0 0.1  BASOSABS 0.0 0.0 0.0 0.0    Chemistries   Recent Labs  Lab 06/11/20 0100 06/12/20 0455 06/13/20 0449 06/14/20 0140 06/15/20 0204  NA 135 139 138 141 138  K 3.5 3.7 3.2* 3.3* 3.4*  CL 109 112* 108 109 105  CO2 19* 23 21* 22 27  GLUCOSE 89 95 108* 108* 145*  BUN 43* 20 12 17 19   CREATININE 1.49* 1.24 1.03 1.05 1.04  CALCIUM 8.5* 8.4* 8.6* 8.7* 8.8*  MG  --   --   --   --  2.0  AST 27 40 74* 61*  --   ALT 26 27 38 34  --   ALKPHOS 42 36* 40 39  --   BILITOT 0.8 0.8 0.8 0.7  --    ------------------------------------------------------------------------------------------------------------------ No results for input(s): CHOL, HDL, LDLCALC, TRIG, CHOLHDL, LDLDIRECT in the last 72 hours.  Lab Results  Component Value Date   HGBA1C 6.6 (H) 06/11/2020   ------------------------------------------------------------------------------------------------------------------ Recent Labs    06/15/20 1212  TSH 2.580   ------------------------------------------------------------------------------------------------------------------  No results for input(s): VITAMINB12, FOLATE, FERRITIN, TIBC, IRON, RETICCTPCT in the last 72 hours.  Coagulation profile Recent Labs  Lab 06/11/20 0100  INR 1.1    No results for input(s): DDIMER in the last 72 hours.  Cardiac Enzymes No results for input(s): CKMB, TROPONINI, MYOGLOBIN in the last 168 hours.  Invalid input(s): CK ------------------------------------------------------------------------------------------------------------------    Component Value Date/Time   BNP 25.0 10/10/2017 2103    Micro Results Recent Results (from the past 240 hour(s))  Culture, blood (Routine X 2) w Reflex to ID Panel     Status: None   Collection Time: 06/10/20 11:32 AM   Specimen: BLOOD  Result Value Ref Range Status   Specimen Description BLOOD RIGHT ANTECUBITAL  Final   Special Requests   Final    Blood Culture adequate volume BOTTLES DRAWN AEROBIC AND ANAEROBIC   Culture   Final    NO GROWTH  5 DAYS Performed at [facility], [address] [place], [place] [address]    Report Status 06/15/2020 FINAL  Final  Culture, blood (Routine X 2) w Reflex to ID Panel     Status: None   Collection Time: 06/10/20 11:33 AM   Specimen: BLOOD  Result Value Ref Range Status   Specimen Description BLOOD BLOOD RIGHT WRIST  Final   Special Requests   Final    Blood Culture adequate volume BOTTLES DRAWN AEROBIC AND ANAEROBIC   Culture   Final    NO GROWTH 5 DAYS Performed at [facility], [address] [place], [place] [address]    Report Status 06/15/2020 FINAL  Final  Urine Culture     Status: None   Collection Time: 06/10/20  1:05 PM   Specimen: Urine, Clean Catch  Result Value Ref Range Status   Specimen Description   Final    URINE, CLEAN CATCH Performed at [facility], [address] [place], [place] [address]    Special Requests   Final    NONE Performed at [facility], [address], [place], [place] [address]    Culture   Final    NO GROWTH Performed at [facility], [place] [address] [place], [place] [address]    Report Status 06/11/2020 FINAL  Final  Blood Culture (routine x 2)     Status: None   Collection Time: 06/11/20  1:00 AM   Specimen: BLOOD LEFT ARM  Result Value Ref Range Status   Specimen Description BLOOD LEFT ARM  Final   Special Requests   Final    BOTTLES DRAWN AEROBIC AND ANAEROBIC Blood Culture adequate volume   Culture   Final    NO GROWTH 5 DAYS Performed at [facility], [place] [address] [place], [place] [address]    Report Status 06/16/2020 FINAL  Final  Urine culture     Status: Abnormal   Collection Time: 06/11/20  1:49 AM   Specimen: In/Out Cath Urine  Result Value Ref Range Status   Specimen Description IN/OUT CATH URINE  Final   Special Requests   Final    NONE Performed at [facility], [place] [address], [place], [place] [address]    Culture MULTIPLE SPECIES PRESENT, SUGGEST RECOLLECTION (A)  Final   Report Status  06/12/2020 FINAL  Final  Blood Culture (routine x 2)     Status: None   Collection Time: 06/11/20  2:00 AM   Specimen: BLOOD RIGHT ARM  Result Value Ref Range Status   Specimen Description  BLOOD RIGHT ARM  Final   Special Requests   Final    BOTTLES DRAWN AEROBIC AND ANAEROBIC Blood Culture adequate volume   Culture   Final    NO GROWTH 5 DAYS Performed at [facility], [address] [place], [place] [address]    Report Status 06/16/2020 FINAL  Final  Resp Panel by RT-PCR (Flu A&B, Covid) Nasopharyngeal Swab     Status: Abnormal   Collection Time: 06/11/20  2:29 AM   Specimen: Nasopharyngeal Swab; Nasopharyngeal(NP) swabs in vial transport medium  Result Value Ref Range Status   SARS Coronavirus 2 by RT PCR POSITIVE (A) NEGATIVE Final    Comment: RESULT CALLED TO, READ BACK BY AND VERIFIED WITH: K MOON RN 06/11/20 0411 JDW (NOTE) SARS-CoV-2 target nucleic acids are DETECTED.  The SARS-CoV-2 RNA is generally detectable in upper respiratory specimens during the acute phase of infection. Positive results are indicative of the presence of the identified virus, but do not rule out bacterial infection or co-infection with other pathogens not detected by the test. Clinical correlation with patient history and other diagnostic information is necessary to determine patient infection status. The expected result is Negative.  Fact Sheet for Patients: EntrepreneurPulse.com.au  Fact Sheet for Healthcare Providers: IncredibleEmployment.be  This test is not yet approved or cleared by the [place] FDA and  has been authorized for detection and/or diagnosis of SARS-CoV-2 by FDA under an Emergency Use Authorization (EUA).  This EUA will remain in effect (meaning this test can be used)  for the duration of  the COVID-19 declaration under Section 564(b)(1) of the Act, 21 U.S.C. section 360bbb-3(b)(1), unless the authorization is terminated or  revoked sooner.     Influenza A by PCR NEGATIVE NEGATIVE Final   Influenza B by PCR NEGATIVE NEGATIVE Final    Comment: (NOTE) The Xpert Xpress SARS-CoV-2/FLU/RSV plus assay is intended as an aid in the diagnosis of influenza from Nasopharyngeal swab specimens and should not be used as a sole basis for treatment. Nasal washings and aspirates are unacceptable for Xpert Xpress SARS-CoV-2/FLU/RSV testing.  Fact Sheet for Patients: EntrepreneurPulse.com.au  Fact Sheet for Healthcare Providers: IncredibleEmployment.be  This test is not yet approved or cleared by the [place] FDA and has been authorized for detection and/or diagnosis of SARS-CoV-2 by FDA under an Emergency Use Authorization (EUA). This EUA will remain in effect (meaning this test can be used) for the duration of the COVID-19 declaration under Section 564(b)(1) of the Act, 21 U.S.C. section 360bbb-3(b)(1), unless the authorization is terminated or revoked.  Performed at [facility], [place] [address] [place], [place] [address]   CSF culture     Status: None   Collection Time: 06/11/20  5:20 AM   Specimen: CSF; Cerebrospinal Fluid  Result Value Ref Range Status   Specimen Description CSF  Final   Special Requests NONE  Final   Gram Stain NO WBC SEEN NO ORGANISMS SEEN CYTOSPIN SMEAR   Final   Culture   Final    NO GROWTH 3 DAYS Performed at [facility], [place] [address], [place], [place] [address]    Report Status 06/14/2020 FINAL  Final  Hsv Culture And Typing     Status: None   Collection Time: 06/11/20  5:20 AM   Specimen: Back; Cerebrospinal Fluid  Result Value Ref Range Status   HSV Culture/Type Comment  Final    Comment: (NOTE) Negative No Herpes simplex virus isolated. Performed At: [facility]  [place], [place] [id number] Rush Farmer MD XB:[id number]    Source of Sample CSF  Final    Comment: Performed at [facility], [place] [address] [place], [place] [address]    Radiology Reports CT Head Wo Contrast  Result Date: 06/10/2020 CLINICAL DATA:  Head and C-spine injury, fall, altered mental status EXAM: CT HEAD WITHOUT CONTRAST CT CERVICAL SPINE WITHOUT CONTRAST TECHNIQUE: Multidetector CT imaging of the head and cervical spine was performed following the standard protocol without intravenous contrast. Multiplanar CT image reconstructions of the cervical spine were also generated. COMPARISON:  05/04/2020 FINDINGS: CT HEAD FINDINGS Brain: Stable mild brain atrophy pattern. No acute intracranial hemorrhage, new mass lesion, acute infarction, midline shift, herniation, hydrocephalus, or extra-axial fluid collection. No focal mass effect or edema. Cisterns are patent. Cerebellar atrophy as well. Vascular: Intracranial atherosclerosis at the skull base. No hyperdense vessel. Skull: Normal. Negative for fracture or focal lesion. Sinuses/Orbits: Minor scattered sinus mucosal thickening as before. Orbits are symmetric. No acute finding. Other: None. CT CERVICAL SPINE FINDINGS Alignment: Normal. Skull base and vertebrae: No acute fracture. No primary bone lesion or focal pathologic process. Soft tissues and spinal canal: Normal prevertebral soft tissues. No hemorrhage or hematoma appreciated. Carotid atherosclerosis. Disc levels: Similar mild multilevel degenerative disc disease and facet arthropathy. Facets are aligned. No subluxation or dislocation. Preserved vertebral body heights. Upper chest: Negative. Other: None. IMPRESSION: Stable brain atrophy pattern. No acute intracranial abnormality or interval change by noncontrast CT. Stable mild cervical degenerative changes as above. No acute osseous finding or malalignment by CT. Electronically Signed   By: Jerilynn Mages.  Shick M.D.   On: 06/10/2020 12:31   CT Cervical Spine Wo Contrast  Result Date: 06/10/2020 CLINICAL DATA:  Head and C-spine injury, fall, altered mental status EXAM:  CT HEAD WITHOUT CONTRAST CT CERVICAL SPINE WITHOUT CONTRAST TECHNIQUE: Multidetector CT imaging of the head and cervical spine was performed following the standard protocol without intravenous contrast. Multiplanar CT image reconstructions of the cervical spine were also generated. COMPARISON:  05/04/2020 FINDINGS: CT HEAD FINDINGS Brain: Stable mild brain atrophy pattern. No acute intracranial hemorrhage, new mass lesion, acute infarction, midline shift, herniation, hydrocephalus, or extra-axial fluid collection. No focal mass effect or edema. Cisterns are patent. Cerebellar atrophy as well. Vascular: Intracranial atherosclerosis at the skull base. No hyperdense vessel. Skull: Normal. Negative for fracture or focal lesion. Sinuses/Orbits: Minor scattered sinus mucosal thickening as before. Orbits are symmetric. No acute finding. Other: None. CT CERVICAL SPINE FINDINGS Alignment: Normal. Skull base and vertebrae: No acute fracture. No primary bone lesion or focal pathologic process. Soft tissues and spinal canal: Normal prevertebral soft tissues. No hemorrhage or hematoma appreciated. Carotid atherosclerosis. Disc levels: Similar mild multilevel degenerative disc disease and facet arthropathy. Facets are aligned. No subluxation or dislocation. Preserved vertebral body heights. Upper chest: Negative. Other: None. IMPRESSION: Stable brain atrophy pattern. No acute intracranial abnormality or interval change by noncontrast CT. Stable mild cervical degenerative changes as above. No acute osseous finding or malalignment by CT. Electronically Signed   By: Jerilynn Mages.  Shick M.D.   On: 06/10/2020 12:31   MR Brain Wo Contrast (neuro protocol)  Result Date: 06/10/2020 CLINICAL DATA:  Acute neuro deficit.  Mental status change. EXAM: MRI HEAD WITHOUT CONTRAST TECHNIQUE: Multiplanar, multiecho pulse sequences of the brain and surrounding structures were obtained without intravenous contrast. COMPARISON:  CT head 06/10/2020  FINDINGS: Brain: Ventricle size and cerebral volume normal for age. Negative for acute infarct. No significant  chronic ischemic change. Negative for hemorrhage or mass. Image quality degraded by mild motion. Vascular: Partial loss of flow void in the left internal carotid artery through the skull base and the distal left vertebral artery which may be due to slow flow or stenosis. Skull and upper cervical spine: Negative Sinuses/Orbits: Mild mucosal edema paranasal sinuses. No orbital mass. Bilateral exophthalmos. Other: None IMPRESSION: Negative for acute infarct. Partial loss of flow void in the left internal carotid artery distal left vertebral artery which could be due to proximal stenosis. Consider CT angio head and neck for further evaluation. Electronically Signed   By: Franchot Gallo M.D.   On: 06/10/2020 15:09   DG Chest Port 1 View  Result Date: 06/11/2020 CLINICAL DATA:  Questionable sepsis. EXAM: PORTABLE CHEST 1 VIEW COMPARISON:  June 10, 2020 FINDINGS: There is no evidence of acute infiltrate, pleural effusion or pneumothorax. The heart size and mediastinal contours are within normal limits. A radiopaque loop recorder device is seen. The visualized skeletal structures are unremarkable. IMPRESSION: No active disease. Electronically Signed   By: Virgina Norfolk M.D.   On: 06/11/2020 03:10   DG Chest Port 1 View  Result Date: 06/10/2020 CLINICAL DATA:  Altered mental status. EXAM: PORTABLE CHEST 1 VIEW COMPARISON:  May 25, 2020 FINDINGS: Loop recorder in place. Cardiomediastinal silhouette is normal. Mediastinal contours appear intact. Tortuosity and calcific atherosclerotic disease of the aorta. There is no evidence of focal airspace consolidation, pleural effusion or pneumothorax. Osseous structures are without acute abnormality. Soft tissues are grossly normal. IMPRESSION: No active disease. Electronically Signed   By: Fidela Salisbury M.D.   On: 06/10/2020 11:15   EEG adult  Result  Date: 06/12/2020 Lora Havens, MD     06/12/2020  3:07 PM Patient Name: Islam Eichinger MRN: [id number] Epilepsy Attending: Lora Havens Referring Physician/Provider: Dr Oren Binet Date: 06/12/2020 Duration: 25.42 mins Patient history: 79yo M with ams. EEG to evaluate for seizure Level of alertness: Awake AEDs during EEG study: None Technical aspects: This EEG study was done with scalp electrodes positioned according to the 10-20 International system of electrode placement. Electrical activity was acquired at a sampling rate of 500Hz  and reviewed with a high frequency filter of 70Hz  and a low frequency filter of 1Hz . EEG data were recorded continuously and digitally stored. Description: No posterior dominant rhythm was seen. EEG showed continuous generalized 5 to 6 Hz theta slowing. Hyperventilation and photic stimulation were not performed.   ABNORMALITY - Continuous slow, generalized IMPRESSION: This study is suggestive of mild to moderate diffuse encephalopathy, nonspecific etiology. No seizures or epileptiform discharges were seen throughout the recording. Lora Havens   CUP PACEART REMOTE DEVICE CHECK  Result Date: 05/22/2020 ILR summary report received. Battery status OK. Normal device function. No new symptom, tachy, brady, or pause episodes. No new AF episodes. Monthly summary reports and ROV/PRN.  R. Powers, CVRS  DG Hips Bilat W or Wo Pelvis 3-4 Views  Result Date: 06/10/2020 CLINICAL DATA:  Found on side of road, last seen normal at 2130 hours last night, fall, dementia, tenderness of hips bilaterally EXAM: DG HIP (WITH OR WITHOUT PELVIS) 3-4V BILAT COMPARISON:  None FINDINGS: Osseous demineralization. Hip and SI joint spaces preserved. No acute fracture, dislocation, or bone destruction. Mild degenerative disc disease changes at visualized lower lumbar spine. IMPRESSION: No acute osseous abnormalities. Electronically Signed   By: Lavonia Dana M.D.   On: 06/10/2020 11:48

## 2020-06-17 NOTE — TOC Initial Note (Signed)
Transition of Care [facility]) - Initial/Assessment Note    Patient Details  Name: Patrick Phillips MRN: [id number] Date of Birth: Jul 27, 1940  Transition of Care [facility]) CM/SW Contact:    Glennon Hamilton, [place] Work Phone Number: 06/17/2020, 10:33 AM  Clinical Narrative:                 CSW intern called patient's daughter Patrick Phillips to discuss possible facility placement. Patrick Phillips noted that she has a lot going on and is about to get custody of her two grandchildren, she also stated that she feels bad about wanting her dad to go to a memory care facility. Though she feels it is the best option for him as long as he is needing a higher level of care. CSW intern noted that memory care placement options were limited as long as he is in his [place] window. However, [facility] and [facility] were possible options. Patrick Phillips said that she would prefer [facility] if those were her only options. She would like patient to go to memory care in [place] or closer to [place] when the search can be expanded. Patrick Phillips expressed feeling very overwhelmed and is hopeful a memory care facility can be found for the patient. Patrick Phillips said that she would update the rest of her family, no further questions at this time.   Expected Discharge Plan: Memory Care Barriers to Discharge: Continued Medical Work up   Patient Goals and CMS Choice Patient states their goals for this hospitalization and ongoing recovery are:: To feel better      Expected Discharge Plan and Services Expected Discharge Plan: Memory Care In-house Referral: Clinical Social Work Discharge Planning Services: CM Consult Post Acute Care Choice: Nursing Home Living arrangements for the past 2 months: Single Family Home                                      Prior Living Arrangements/Services Living arrangements for the past 2 months: Single Family Home Lives with:: Self Patient language and need for interpreter reviewed:: Yes Do  you feel safe going back to the place where you live?: Yes      Need for Family Participation in Patient Care: Yes (Comment) Care giver support system in place?: Yes (comment)   Criminal Activity/Legal Involvement Pertinent to Current Situation/Hospitalization: No - Comment as needed  Activities of Daily Living Home Assistive Devices/Equipment: Dentures (specify type) (upper, but doesnt use) ADL Screening (condition at time of admission) Patient's cognitive ability adequate to safely complete daily activities?: No Is the patient deaf or have difficulty hearing?: No Does the patient have difficulty seeing, even when wearing glasses/contacts?: No Does the patient have difficulty concentrating, remembering, or making decisions?: Yes Patient able to express need for assistance with ADLs?: No Does the patient have difficulty dressing or bathing?: Yes Independently performs ADLs?: No Communication: Needs assistance Is this a change from baseline?: Change from baseline, expected to last <3 days Dressing (OT): Needs assistance Is this a change from baseline?: Change from baseline, expected to last <3days Grooming: Needs assistance Is this a change from baseline?: Change from baseline, expected to last <3 days Feeding: Independent Bathing: Needs assistance Is this a change from baseline?: Change from baseline, expected to last <3 days Toileting: Needs assistance Is this a change from baseline?: Change from baseline, expected to last <3 days In/Out Bed: Needs assistance Is this a change from baseline?: Change  from baseline, expected to last <3 days Walks in Home: Independent Does the patient have difficulty walking or climbing stairs?: No Weakness of Legs: None Weakness of Arms/Hands: None  Permission Sought/Granted Permission sought to share information with : Facility Art therapist granted to share information with : Yes, Verbal Permission Granted (Patients next of kin  granted permission as he is unable to engage)     Permission granted to share info w AGENCY: Memory Care Facilities        Emotional Assessment Appearance:: Appears stated age Attitude/Demeanor/Rapport: Unable to Assess Affect (typically observed): Unable to Assess Orientation: : Oriented to Self Alcohol / Substance Use: Not Applicable Psych Involvement: No (comment)  Admission diagnosis:  Delirium [Z79.1] Acute metabolic encephalopathy [T05.69] COVID [U07.1] Patient Active Problem List   Diagnosis Date Noted  . Acute metabolic encephalopathy 79/48/0165  . COVID-19 virus infection 06/11/2020  . Vascular dementia ([place]) 06/11/2020  . Acute ischemic stroke ([place]) 07/24/2017  . Chest pain 12/22/2015  . Chronic back pain 12/22/2015  . Syncope 10/17/2013  . Sinus bradycardia 10/17/2013  . SOB (shortness of breath) 10/17/2013  . Hypertension   . Diabetes mellitus without complication [facility])    PCP:  [name], MD Pharmacy:   [place], [address] [place] [address] Phone: [phone] Fax: [phone]     Social Determinants of Health (SDOH) Interventions    Readmission Risk Interventions No flowsheet data found.

## 2020-06-17 NOTE — Progress Notes (Signed)
Daughter has been in rm with the pt for the last several hours. He has not needed to be restrained while she is visiting.

## 2020-06-17 NOTE — Progress Notes (Signed)
Patient ID: Patrick Phillips, male   DOB: [birth year], 80 y.o.   MRN: [id number] Patient continues to improve in terms of his mentation.  Although it appears he is having some sundowners, as it is noted he becomes increasingly agitated and aggressive at night.  He does appear to have an uneventful night, after administration of his nighttime medication.  He appears to be benefiting from his current medication regimen.  This morning on evaluation patient is alert and oriented x3.  He is able to tell me he is in [facility], downtown however unable to identify correctly he is in the hospital.  He does make multiple requests to get out of bed and states he has to use the bathroom.  However he is easily redirected, in which he followed my direction and remained in bed.  This information was communicated to his nurse tech, who later assisted in place him on the bedpan.  Patient was also observed eating his breakfast, and which he was able to feed himself and then pushed the bedside table away when he was done with his breakfast.  He denies any suicidal ideations, homicidal ideations, and or auditory or visual hallucinations.  He does not appear to be responding to internal stimuli, external stimuli, and or exhibiting delusional thinking.  He is able to answer most questions appropriately.   -Psychiatry to continue to follow per recommendations of his medical team, although the patient is improving. -Please expect sundowners, as patient does have diagnosed dementia and likely delirium.  Will adjust his nighttime medication lorazepam to be administered at 2000. -Initiate delirium precautions.  Will recommend reducing use of restraints, and consider adding a one-to-one safety sitter at bedside to help with frequent reorientation and redirecting of patient during periods of confusion.

## 2020-06-17 NOTE — Plan of Care (Signed)
Patient is currently resting in bed. Slept throughout most of the night after given bed time meds. Patient was very agitated at the beginning of the shift yelling, and pulling at rails to get OOB. Wrist restraints on and mitts placed.This AM around 0400 pt woke up and tried to get OOB but was easily directed to relax and rest. Given drinks and food. Mitts removed. Patient ended up going back to sleep. Call bell on lap. Condom cath in place. Bed alarm on. Frequent rounding performed throughout shift.  Problem: Safety: Goal: Violent Restraint(s) Outcome: Progressing   Problem: Safety: Goal: Non-violent Restraint(s) Outcome: Progressing   Problem: Education: Goal: Knowledge of risk factors and measures for prevention of condition will improve Outcome: Progressing   Problem: Coping: Goal: Psychosocial and spiritual needs will be supported Outcome: Progressing   Problem: Respiratory: Goal: Will maintain a patent airway Outcome: Progressing Goal: Complications related to the disease process, condition or treatment will be avoided or minimized Outcome: Progressing

## 2020-06-18 DIAGNOSIS — G9341 Metabolic encephalopathy: Secondary | ICD-10-CM | POA: Diagnosis not present

## 2020-06-18 DIAGNOSIS — E119 Type 2 diabetes mellitus without complications: Secondary | ICD-10-CM | POA: Diagnosis not present

## 2020-06-18 LAB — BASIC METABOLIC PANEL
Anion gap: 9 (ref 5–15)
BUN: 18 mg/dL (ref 8–23)
CO2: 25 mmol/L (ref 22–32)
Calcium: 9.1 mg/dL (ref 8.9–10.3)
Chloride: 103 mmol/L (ref 98–111)
Creatinine, Ser: 0.97 mg/dL (ref 0.61–1.24)
GFR, Estimated: >60 mL/min (ref >60)
Glucose, Bld: 160 mg/dL — ABNORMAL HIGH (ref 70–99)
Potassium: 3.9 mmol/L (ref 3.5–5.1)
Sodium: 137 mmol/L (ref 135–145)

## 2020-06-18 LAB — GLUCOSE, CAPILLARY
Glucose-Capillary: 143 mg/dL — ABNORMAL HIGH (ref 70–99)
Glucose-Capillary: 150 mg/dL — ABNORMAL HIGH (ref 70–99)
Glucose-Capillary: 189 mg/dL — ABNORMAL HIGH (ref 70–99)
Glucose-Capillary: 184 mg/dL — ABNORMAL HIGH (ref 70–99)

## 2020-06-18 LAB — ARBOVIRUS IGG, CSF
California Enceph IgG: <1:1 {titer}
Eastern eq encephalitis, IgG: <1:1 {titer}
St Louis encephalitis, IgG: <1:1 {titer}
West Nile IgG CSF: 0.65 IV (ref <1.30)
Western eq encephalitis, IgG: <1:1 {titer}

## 2020-06-18 MED ORDER — LORAZEPAM 0.5 MG PO TABS: 0.2500 mg | ORAL_TABLET | Freq: Once | ORAL | Status: DC | PRN

## 2020-06-18 MED ORDER — SODIUM CHLORIDE 0.9 % IV SOLN
100.0000 mg | Freq: Two times a day (BID) | INTRAVENOUS | Status: DC
  Administered 2020-06-18 – 2020-06-22 (×9): 100 mg via INTRAVENOUS
  Filled 2020-06-18 (×12): qty 100

## 2020-06-18 MED ORDER — LORAZEPAM 0.5 MG PO TABS
0.2500 mg | ORAL_TABLET | Freq: Every morning | ORAL | Status: DC
  Administered 2020-06-18: 0.25 mg via ORAL
  Filled 2020-06-18: qty 1

## 2020-06-18 MED ORDER — DIVALPROEX SODIUM 125 MG PO CSDR
375.0000 mg | DELAYED_RELEASE_CAPSULE | Freq: Two times a day (BID) | ORAL | Status: DC
  Administered 2020-06-18 – 2020-06-22 (×8): 375 mg via ORAL
  Filled 2020-06-18 (×8): qty 3

## 2020-06-18 NOTE — Progress Notes (Signed)
Bath incont this AM, during bath patient was found to have two ticks near groin area. Both ticks pick and placed in specimen cup. Areas on skin circled.   Provider (Dr. Cyd Silence) made aware about findings.

## 2020-06-18 NOTE — Progress Notes (Addendum)
PROGRESS NOTE                                                                                                                                                                                                             Patient Demographics:    Patrick Phillips, is a 80 y.o. male, DOB - Oct 06, 1940, AYT:[id number]  Outpatient Primary MD for the patient is [name], MD   Admit date - 06/10/2020   LOS - 7  Chief Complaint  Patient presents with  . Altered Mental Status       Brief Narrative: Patient is a 80 y.o. male with PMHx of vascular dementia, prior CVA, DM-2, HTN-who presented with fever and confusion.  COVID-19 vaccinated status: Unknown  Significant Events: 4/14>> Admit to [facility] for fever/confusion  Significant studies: 4/14>>Chest x-ray: No PNA 4/14>> CT head: No acute intracranial abnormality. 4/14>> CT C-spine: No fracture 4/14>> MRI brain: No acute infarct. 4/15>> chest x-ray: No PNA 4/15>> CSF: WBC 1 (tube #4), protein 46, glucose 54 4/16>> EEG: Mild to moderate diffuse encephalopathy-no seizures.  COVID-19 medications: Remdesivir: 4/15>>  Antibiotics: Vancomycin: 4/14 x1 Ceftriaxone: 4/14 x 1 Ampicillin: 4/14 x 1  Microbiology data: 4/14 >>blood culture: No growth 4/14>> urine culture: No growth 4/15>> blood culture: No growth 4/15>> urine culture: Multiple species 4/15>> CSF culture: No growth 4/15>> CSF PCR HSV 1/2: Negative. 4/15>> CSF HSV culture: Negative 4/15>> CSF arbovirus IgG: Negative 4/15>> CSF VDRL: Negative  Procedures: 4/15>> lumbar puncture in the emergency room  Consults:  Neurology, psychiatry  DVT prophylaxis: enoxaparin (LOVENOX) injection 40 mg Start: 06/11/20 0600    Subjective:   Still confused-at times hallucinates.  Has had issues with agitation over the past several days.   Assessment  & Plan :   Acute metabolic encephalopathy-superimposed on  dementia with delirium: Encephalopathy on presentation was likely related to febrile illness/COVID-19 infection-all culture data negative so far.  Neuroimaging as above.  EEG negative for seizures.  He has had no fever since admission-his encephalopathy that he had on admission has resolved-I suspect his confusion/agitation and now is related to delirium associated with dementia.  Evaluated by psychiatry and started on Saphris and Depakote-since he continues to have episodes of breakthrough delirium-psychiatry has added Ativan nightly.  Psychiatry continues to follow for medical optimization.  SIRS-COVID-19 infection: Completed Remdesivir x3 days.  Not hypoxic-no pneumonia-does not require any further therapy.  Interestingly-patient apparently had COVID-19 infection in February as well.  No results for input(s): DDIMER, FERRITIN, LDH, CRP in the last 72 hours.  Lab Results  Component Value Date   [place] (A) 06/11/2020   [place] NEGATIVE 01/13/2020    ? Tick Bite: 2 ticks found last night by nursing staff and patient- will discuss with ID-if patient needs empiric Doxy coverage.  Addendum: Discussed with ID-Dr. Santa Lighter IV doxycycline to see if patient's mental status will improve-called lab-do still have CSF saved-have added RMSF panel.  Interestingly-patient's grandson was also just diagnosed with RMSF as well.  AKI: Mild-likely hemodynamically mediated-resolved with IV fluids.  Borderline vitamin B12 deficiency: On supplementation.  Hypokalemia: Repleted-recheck periodically.  HTN: BP remains elevated-add low-dose Coreg-continue amlodipine.  Follow and optimize.    DM-2: CBGs relatively stable-continue SSI-oral hypoglycemic agents on hold  Recent Labs    06/17/20 2032 06/18/20 0825 06/18/20 1213  GLUCAP 137* 143* 150*   History of CVA-MRI brain negative for infarct-on aspirin/statin.  Dementia: On Namenda  GERD: On PPI  ABG:    Component Value  Date/Time   TCO2 18 (L) 06/10/2020 1026    Vent Settings: N/A  Condition - Extremely Guarded  Family Communication  :  Daughter Misty-[phone]-updated over the phone on 4/22   Daughter-[phone] at bedside on 4/21.    Code Status :  Full Code  Diet :  Diet Order            Diet regular Room service appropriate? Yes; Fluid consistency: Thin  Diet effective now                  Disposition Plan  :   Status is: Inpatient  Remains inpatient appropriate because:Inpatient level of care appropriate due to severity of illness   Dispo: The patient is from: Home              Anticipated d/c is to: Home              Patient currently is not medically stable to d/c.   Difficult to place patient No   Barriers to discharge: Resolving encephalopathy-awaiting final culture data.  Antimicorbials  :    Anti-infectives (From admission, onward)   Start     Dose/Rate Route Frequency Ordered Stop   06/12/20 1000  remdesivir 100 mg in sodium chloride 0.9 % 100 mL IVPB        100 mg 200 mL/hr over 30 Minutes Intravenous Daily 06/11/20 0435 06/13/20 0900   06/11/20 0445  remdesivir 200 mg in sodium chloride 0.9% 250 mL IVPB        200 mg 580 mL/hr over 30 Minutes Intravenous Once 06/11/20 0434 06/11/20 0929   06/11/20 0230  vancomycin (VANCOREADY) IVPB 1500 mg/300 mL        1,500 mg 150 mL/hr over 120 Minutes Intravenous  Once 06/11/20 0229 06/11/20 0812   06/11/20 0215  ampicillin (OMNIPEN) 2 g in sodium chloride 0.9 % 100 mL IVPB        2 g 300 mL/hr over 20 Minutes Intravenous  Once 06/11/20 0157 06/11/20 0550   06/11/20 0215  vancomycin (VANCOREADY) IVPB 1000 mg/200 mL  Status:  Discontinued        1,000 mg 200 mL/hr over 60 Minutes Intravenous  Once 06/11/20 0157 06/11/20 0229   06/11/20 0200  cefTRIAXone (ROCEPHIN) 2 g in sodium chloride 0.9 % 100 mL IVPB  2 g 200 mL/hr over 30 Minutes Intravenous  Once 06/11/20 0157 06/11/20 0248      Inpatient  Medications  Scheduled Meds: . amLODipine  10 mg Oral Daily  . asenapine  5 mg Sublingual BID  . aspirin EC  81 mg Oral Daily  . atorvastatin  40 mg Oral Daily  . carvedilol  3.125 mg Oral BID WC  . cyanocobalamin  1,000 mcg Subcutaneous Daily  . divalproex  375 mg Oral Q12H  . enoxaparin (LOVENOX) injection  40 mg Subcutaneous Q24H  . feeding supplement  237 mL Oral BID BM  . insulin aspart  0-9 Units Subcutaneous TID WC  . LORazepam  0.5 mg Oral QHS  . melatonin  5 mg Oral QHS  . multivitamin with minerals  1 tablet Oral Daily  . pantoprazole  40 mg Oral Daily  . thiamine injection  100 mg Intravenous Daily   Or  . thiamine  100 mg Oral Daily   Continuous Infusions:  PRN Meds:.acetaminophen, haloperidol lactate, hydrALAZINE, [START ON 06/19/2020] LORazepam, ondansetron **OR** ondansetron (ZOFRAN) IV   Time Spent in minutes  25    See all Orders from today for further details   Oren Binet M.D on 06/18/2020 at 4:09 PM  To page go to [email or web address] - use universal password  [facility] -  Office  [phone]    Objective:   Vitals:   06/18/20 0357 06/18/20 0851 06/18/20 0857 06/18/20 1259  BP: 133/78 (!) 167/94  (!) 173/82  Pulse: 64  72   Resp: 14   16  Temp: 99 F (37.2 C)   (!) 97.5 F (36.4 C)  TempSrc: Axillary   Oral  SpO2: 94%     Weight:      Height:        Wt Readings from Last 3 Encounters:  06/10/20 86.2 kg  06/10/20 86.2 kg  04/19/20 86.2 kg     Intake/Output Summary (Last 24 hours) at 06/18/2020 1609 Last data filed at 06/18/2020 0400 Gross per 24 hour  Intake --  Output 700 ml  Net -700 ml     Physical Exam Gen Exam: Confused-but calm this morning. HEENT:atraumatic, normocephalic Chest: B/L clear to auscultation anteriorly CVS:S1S2 regular Abdomen:soft non tender, non distended Extremities:no edema Neurology: Non focal Skin: no rash   Data Review:    CBC Recent Labs  Lab 06/12/20 0455 06/13/20 0449  06/14/20 0140  WBC 6.1 5.2 5.6  HGB 12.1* 12.6* 12.0*  HCT 37.6* 36.9* 36.3*  PLT 169 165 164  MCV 86.8 84.1 86.4  MCH 27.9 28.7 28.6  MCHC 32.2 34.1 33.1  RDW 14.6 13.8 14.1  LYMPHSABS 1.5 0.9 1.5  MONOABS 0.5 0.3 0.4  EOSABS 0.1 0.0 0.1  BASOSABS 0.0 0.0 0.0    Chemistries  Recent Labs  Lab 06/12/20 0455 06/13/20 0449 06/14/20 0140 06/15/20 0204 06/18/20 0418  NA 139 138 141 138 137  K 3.7 3.2* 3.3* 3.4* 3.9  CL 112* 108 109 105 103  CO2 23 21* 22 27 25   GLUCOSE 95 108* 108* 145* 160*  BUN 20 12 17 19 18   CREATININE 1.24 1.03 1.05 1.04 0.97  CALCIUM 8.4* 8.6* 8.7* 8.8* 9.1  MG  --   --   --  2.0  --   AST 40 74* 61*  --   --   ALT 27 38 34  --   --   ALKPHOS 36* 40 39  --   --   BILITOT 0.8  0.8 0.7  --   --    ------------------------------------------------------------------------------------------------------------------ No results for input(s): CHOL, HDL, LDLCALC, TRIG, CHOLHDL, LDLDIRECT in the last 72 hours.  Lab Results  Component Value Date   HGBA1C 6.6 (H) 06/11/2020   ------------------------------------------------------------------------------------------------------------------ No results for input(s): TSH, T4TOTAL, T3FREE, THYROIDAB in the last 72 hours.  Invalid input(s): FREET3 ------------------------------------------------------------------------------------------------------------------ No results for input(s): VITAMINB12, FOLATE, FERRITIN, TIBC, IRON, RETICCTPCT in the last 72 hours.  Coagulation profile No results for input(s): INR, PROTIME in the last 168 hours.  No results for input(s): DDIMER in the last 72 hours.  Cardiac Enzymes No results for input(s): CKMB, TROPONINI, MYOGLOBIN in the last 168 hours.  Invalid input(s): CK ------------------------------------------------------------------------------------------------------------------    Component Value Date/Time   BNP 25.0 10/10/2017 2103    Micro Results Recent  Results (from the past 240 hour(s))  Culture, blood (Routine X 2) w Reflex to ID Panel     Status: None   Collection Time: 06/10/20 11:32 AM   Specimen: BLOOD  Result Value Ref Range Status   Specimen Description BLOOD RIGHT ANTECUBITAL  Final   Special Requests   Final    Blood Culture adequate volume BOTTLES DRAWN AEROBIC AND ANAEROBIC   Culture   Final    NO GROWTH 5 DAYS Performed at [facility], [address] [place], [place] [address]    Report Status 06/15/2020 FINAL  Final  Culture, blood (Routine X 2) w Reflex to ID Panel     Status: None   Collection Time: 06/10/20 11:33 AM   Specimen: BLOOD  Result Value Ref Range Status   Specimen Description BLOOD BLOOD RIGHT WRIST  Final   Special Requests   Final    Blood Culture adequate volume BOTTLES DRAWN AEROBIC AND ANAEROBIC   Culture   Final    NO GROWTH 5 DAYS Performed at [facility], [address], [place], [place] [address]    Report Status 06/15/2020 FINAL  Final  Urine Culture     Status: None   Collection Time: 06/10/20  1:05 PM   Specimen: Urine, Clean Catch  Result Value Ref Range Status   Specimen Description   Final    URINE, CLEAN CATCH Performed at [facility], [address] [place], [place] [address]    Special Requests   Final    NONE Performed at [facility], [address] [place], [place] [address]    Culture   Final    NO GROWTH Performed at [facility], [place] [address] [place], [place] [address]    Report Status 06/11/2020 FINAL  Final  Blood Culture (routine x 2)     Status: None   Collection Time: 06/11/20  1:00 AM   Specimen: BLOOD LEFT ARM  Result Value Ref Range Status   Specimen Description BLOOD LEFT ARM  Final   Special Requests   Final    BOTTLES DRAWN AEROBIC AND ANAEROBIC Blood Culture adequate volume   Culture   Final    NO GROWTH 5 DAYS Performed at [facility], [place] [address] [place], [place] [address]    Report Status 06/16/2020 FINAL  Final  Urine  culture     Status: Abnormal   Collection Time: 06/11/20  1:49 AM   Specimen: In/Out Cath Urine  Result Value Ref Range Status   Specimen Description IN/OUT CATH URINE  Final   Special Requests   Final    NONE Performed at [facility], [place]  [address], [place], [place] [address]    Culture MULTIPLE SPECIES PRESENT, SUGGEST RECOLLECTION (A)  Final   Report Status 06/12/2020 FINAL  Final  Blood Culture (routine x 2)     Status: None   Collection Time: 06/11/20  2:00 AM   Specimen: BLOOD RIGHT ARM  Result Value Ref Range Status   Specimen Description BLOOD RIGHT ARM  Final   Special Requests   Final    BOTTLES DRAWN AEROBIC AND ANAEROBIC Blood Culture adequate volume   Culture   Final    NO GROWTH 5 DAYS Performed at [place], [place] [address] [place], [place] [address]    Report Status 06/16/2020 FINAL  Final  Resp Panel by RT-PCR (Flu A&B, Covid) Nasopharyngeal Swab     Status: Abnormal   Collection Time: 06/11/20  2:29 AM   Specimen: Nasopharyngeal Swab; Nasopharyngeal(NP) swabs in vial transport medium  Result Value Ref Range Status   SARS Coronavirus 2 by RT PCR POSITIVE (A) NEGATIVE Final    Comment: RESULT CALLED TO, READ BACK BY AND VERIFIED WITH: K MOON RN 06/11/20 0411 JDW (NOTE) SARS-CoV-2 target nucleic acids are DETECTED.  The SARS-CoV-2 RNA is generally detectable in upper respiratory specimens during the acute phase of infection. Positive results are indicative of the presence of the identified virus, but do not rule out bacterial infection or co-infection with other pathogens not detected by the test. Clinical correlation with patient history and other diagnostic information is necessary to determine patient infection status. The expected result is Negative.  Fact Sheet for Patients: EntrepreneurPulse.com.au  Fact Sheet for Healthcare Providers: IncredibleEmployment.be  This test is not yet approved or  cleared by the [place] FDA and  has been authorized for detection and/or diagnosis of SARS-CoV-2 by FDA under an Emergency Use Authorization (EUA).  This EUA will remain in effect (meaning this test can be used)  for the duration of  the COVID-19 declaration under Section 564(b)(1) of the Act, 21 U.S.C. section 360bbb-3(b)(1), unless the authorization is terminated or revoked sooner.     Influenza A by PCR NEGATIVE NEGATIVE Final   Influenza B by PCR NEGATIVE NEGATIVE Final    Comment: (NOTE) The Xpert Xpress SARS-CoV-2/FLU/RSV plus assay is intended as an aid in the diagnosis of influenza from Nasopharyngeal swab specimens and should not be used as a sole basis for treatment. Nasal washings and aspirates are unacceptable for Xpert Xpress SARS-CoV-2/FLU/RSV testing.  Fact Sheet for Patients: EntrepreneurPulse.com.au  Fact Sheet for Healthcare Providers: IncredibleEmployment.be  This test is not yet approved or cleared by the [place] FDA and has been authorized for detection and/or diagnosis of SARS-CoV-2 by FDA under an Emergency Use Authorization (EUA). This EUA will remain in effect (meaning this test can be used) for the duration of the COVID-19 declaration under Section 564(b)(1) of the Act, 21 U.S.C. section 360bbb-3(b)(1), unless the authorization is terminated or revoked.  Performed at [facility], [place] [address], [place], [place] [address]   CSF culture     Status: None   Collection Time: 06/11/20  5:20 AM   Specimen: CSF; Cerebrospinal Fluid  Result Value Ref Range Status   Specimen Description CSF  Final   Special Requests NONE  Final   Gram Stain NO WBC SEEN NO ORGANISMS SEEN CYTOSPIN SMEAR   Final   Culture   Final    NO GROWTH 3 DAYS Performed at [facility], [place] [address], [place],  [address]  Report Status 06/14/2020 FINAL  Final  Hsv Culture And Typing     Status: None    Collection Time: 06/11/20  5:20 AM   Specimen: Back; Cerebrospinal Fluid  Result Value Ref Range Status   HSV Culture/Type Comment  Final    Comment: (NOTE) Negative No Herpes simplex virus isolated. Performed At: [facility] [place], [place] [id number] Rush Farmer MD [id number]    Source of Sample CSF  Final    Comment: Performed at [facility], [place] [address], [place], [place] [address]    Radiology Reports CT Head Wo Contrast  Result Date: 06/10/2020 CLINICAL DATA:  Head and C-spine injury, fall, altered mental status EXAM: CT HEAD WITHOUT CONTRAST CT CERVICAL SPINE WITHOUT CONTRAST TECHNIQUE: Multidetector CT imaging of the head and cervical spine was performed following the standard protocol without intravenous contrast. Multiplanar CT image reconstructions of the cervical spine were also generated. COMPARISON:  05/04/2020 FINDINGS: CT HEAD FINDINGS Brain: Stable mild brain atrophy pattern. No acute intracranial hemorrhage, new mass lesion, acute infarction, midline shift, herniation, hydrocephalus, or extra-axial fluid collection. No focal mass effect or edema. Cisterns are patent. Cerebellar atrophy as well. Vascular: Intracranial atherosclerosis at the skull base. No hyperdense vessel. Skull: Normal. Negative for fracture or focal lesion. Sinuses/Orbits: Minor scattered sinus mucosal thickening as before. Orbits are symmetric. No acute finding. Other: None. CT CERVICAL SPINE FINDINGS Alignment: Normal. Skull base and vertebrae: No acute fracture. No primary bone lesion or focal pathologic process. Soft tissues and spinal canal: Normal prevertebral soft tissues. No hemorrhage or hematoma appreciated. Carotid atherosclerosis. Disc levels: Similar mild multilevel degenerative disc disease and facet arthropathy. Facets are aligned. No subluxation or dislocation. Preserved vertebral body heights. Upper chest: Negative. Other: None. IMPRESSION: Stable brain  atrophy pattern. No acute intracranial abnormality or interval change by noncontrast CT. Stable mild cervical degenerative changes as above. No acute osseous finding or malalignment by CT. Electronically Signed   By: Jerilynn Mages.  Shick M.D.   On: 06/10/2020 12:31   CT Cervical Spine Wo Contrast  Result Date: 06/10/2020 CLINICAL DATA:  Head and C-spine injury, fall, altered mental status EXAM: CT HEAD WITHOUT CONTRAST CT CERVICAL SPINE WITHOUT CONTRAST TECHNIQUE: Multidetector CT imaging of the head and cervical spine was performed following the standard protocol without intravenous contrast. Multiplanar CT image reconstructions of the cervical spine were also generated. COMPARISON:  05/04/2020 FINDINGS: CT HEAD FINDINGS Brain: Stable mild brain atrophy pattern. No acute intracranial hemorrhage, new mass lesion, acute infarction, midline shift, herniation, hydrocephalus, or extra-axial fluid collection. No focal mass effect or edema. Cisterns are patent. Cerebellar atrophy as well. Vascular: Intracranial atherosclerosis at the skull base. No hyperdense vessel. Skull: Normal. Negative for fracture or focal lesion. Sinuses/Orbits: Minor scattered sinus mucosal thickening as before. Orbits are symmetric. No acute finding. Other: None. CT CERVICAL SPINE FINDINGS Alignment: Normal. Skull base and vertebrae: No acute fracture. No primary bone lesion or focal pathologic process. Soft tissues and spinal canal: Normal prevertebral soft tissues. No hemorrhage or hematoma appreciated. Carotid atherosclerosis. Disc levels: Similar mild multilevel degenerative disc disease and facet arthropathy. Facets are aligned. No subluxation or dislocation. Preserved vertebral body heights. Upper chest: Negative. Other: None. IMPRESSION: Stable brain atrophy pattern. No acute intracranial abnormality or interval change by noncontrast CT. Stable mild cervical degenerative changes as above. No acute osseous finding or malalignment by CT.  Electronically Signed   By: Jerilynn Mages.  Shick M.D.   On: 06/10/2020 12:31   MR Brain Wo  Contrast (neuro protocol)  Result Date: 06/10/2020 CLINICAL DATA:  Acute neuro deficit.  Mental status change. EXAM: MRI HEAD WITHOUT CONTRAST TECHNIQUE: Multiplanar, multiecho pulse sequences of the brain and surrounding structures were obtained without intravenous contrast. COMPARISON:  CT head 06/10/2020 FINDINGS: Brain: Ventricle size and cerebral volume normal for age. Negative for acute infarct. No significant chronic ischemic change. Negative for hemorrhage or mass. Image quality degraded by mild motion. Vascular: Partial loss of flow void in the left internal carotid artery through the skull base and the distal left vertebral artery which may be due to slow flow or stenosis. Skull and upper cervical spine: Negative Sinuses/Orbits: Mild mucosal edema paranasal sinuses. No orbital mass. Bilateral exophthalmos. Other: None IMPRESSION: Negative for acute infarct. Partial loss of flow void in the left internal carotid artery distal left vertebral artery which could be due to proximal stenosis. Consider CT angio head and neck for further evaluation. Electronically Signed   By: Franchot Gallo M.D.   On: 06/10/2020 15:09   DG Chest Port 1 View  Result Date: 06/11/2020 CLINICAL DATA:  Questionable sepsis. EXAM: PORTABLE CHEST 1 VIEW COMPARISON:  June 10, 2020 FINDINGS: There is no evidence of acute infiltrate, pleural effusion or pneumothorax. The heart size and mediastinal contours are within normal limits. A radiopaque loop recorder device is seen. The visualized skeletal structures are unremarkable. IMPRESSION: No active disease. Electronically Signed   By: Virgina Norfolk M.D.   On: 06/11/2020 03:10   DG Chest Port 1 View  Result Date: 06/10/2020 CLINICAL DATA:  Altered mental status. EXAM: PORTABLE CHEST 1 VIEW COMPARISON:  May 25, 2020 FINDINGS: Loop recorder in place. Cardiomediastinal silhouette is normal.  Mediastinal contours appear intact. Tortuosity and calcific atherosclerotic disease of the aorta. There is no evidence of focal airspace consolidation, pleural effusion or pneumothorax. Osseous structures are without acute abnormality. Soft tissues are grossly normal. IMPRESSION: No active disease. Electronically Signed   By: Fidela Salisbury M.D.   On: 06/10/2020 11:15   EEG adult  Result Date: 06/12/2020 Lora Havens, MD     06/12/2020  3:07 PM Patient Name: Tin Stankus MRN: [id number] Epilepsy Attending: Lora Havens Referring Physician/Provider: Dr Oren Binet Date: 06/12/2020 Duration: 25.42 mins Patient history: 80yo M with ams. EEG to evaluate for seizure Level of alertness: Awake AEDs during EEG study: None Technical aspects: This EEG study was done with scalp electrodes positioned according to the 10-20 International system of electrode placement. Electrical activity was acquired at a sampling rate of 500Hz  and reviewed with a high frequency filter of 70Hz  and a low frequency filter of 1Hz . EEG data were recorded continuously and digitally stored. Description: No posterior dominant rhythm was seen. EEG showed continuous generalized 5 to 6 Hz theta slowing. Hyperventilation and photic stimulation were not performed.   ABNORMALITY - Continuous slow, generalized IMPRESSION: This study is suggestive of mild to moderate diffuse encephalopathy, nonspecific etiology. No seizures or epileptiform discharges were seen throughout the recording. Priyanka Barbra Sarks   DG Hips Bilat W or Wo Pelvis 3-4 Views  Result Date: 06/10/2020 CLINICAL DATA:  Found on side of road, last seen normal at 2130 hours last night, fall, dementia, tenderness of hips bilaterally EXAM: DG HIP (WITH OR WITHOUT PELVIS) 3-4V BILAT COMPARISON:  None FINDINGS: Osseous demineralization. Hip and SI joint spaces preserved. No acute fracture, dislocation, or bone destruction. Mild degenerative disc disease changes at visualized lower  lumbar spine. IMPRESSION: No acute osseous abnormalities. Electronically Signed   By:  Lavonia Dana M.D.   On: 06/10/2020 11:48

## 2020-06-18 NOTE — Progress Notes (Signed)
Pt remains in restraints. Pt got out of R wrist restraint this am and ripped condom cath off and was trying to throw legs over side rails. Pt oriented to self and otherwise very agitated and confused. Farris Has FNP and Dr. Darnell Level notified via secure chat of pt's behavior. PRN PO ativan ordered by FNP. I administered and am waiting to re-assess pt's behavioral state. Will cont restraint charting and will monitor pt.

## 2020-06-18 NOTE — Progress Notes (Signed)
Physical Therapy Treatment Patient Details Name: Patrick Phillips MRN: [id number] DOB: 1940/08/07 Today's Date: 06/18/2020    History of Present Illness 80 y.o. male with presented with fever and confusion. Pt with dementia however still highly functional, took dog for a walk and dog came back without him. Pt later found lying on the side of road with no memory of what had happened but able to follow commands, taken to [facility] where MRI was negative for stroke and x-ray negative for fx. D/c home with daughter but pt with increasing confusion pt then brought  to [facility] ED 06/10/20. Pt with increasing confusion and agitation. Pt found to be febrile and COVID+. Admitted for acute metabolic encephalopathy.    PMHx of vascular dementia, prior CVA, DM-2, HTN    PT Comments    Pt with incr ambulation today. Mobility waxes/wanes with mental status/agitation/medication. Family unable to care for pt at home so he will need facility placement. If memory care can assist him with mobility that would be appropriate. If he needs to be more independent with mobility for memory care then he may need snf prior.    Follow Up Recommendations  SNF;Other (comment) (vs memory care)     Equipment Recommendations  None recommended by PT    Recommendations for Other Services       Precautions / Restrictions Precautions Precautions: Fall    Mobility  Bed Mobility Overal bed mobility: Needs Assistance Bed Mobility: Supine to Sit;Sit to Supine     Supine to sit: Supervision;HOB elevated Sit to supine: Min guard;Min assist   General bed mobility comments: Incr time to get to EOB. Assist to bring legs back up into bed returning to supine    Transfers Overall transfer level: Needs assistance Equipment used: 1 person hand held assist Transfers: Sit to/from Stand Sit to Stand: Min assist         General transfer comment: Assist to bring hips up and for balance  Ambulation/Gait Ambulation/Gait assistance:  Min assist Gait Distance (Feet): 75 Feet Assistive device: 1 person hand held assist Gait Pattern/deviations: Step-through pattern;Decreased step length - right;Decreased step length - left;Shuffle;Trunk flexed;Narrow base of support Gait velocity: decr Gait velocity interpretation: <1.31 ft/sec, indicative of household ambulator General Gait Details: Assist for balance. Verbal cues to widen base of support.   Stairs             Wheelchair Mobility    Modified Rankin (Stroke Patients Only)       Balance Overall balance assessment: Needs assistance Sitting-balance support: Feet supported;No upper extremity supported Sitting balance-Leahy Scale: Fair     Standing balance support: No upper extremity supported;Bilateral upper extremity supported;During functional activity Standing balance-Leahy Scale: Poor Standing balance comment: UE assist for static standing                            Cognition Arousal/Alertness: Awake/alert Behavior During Therapy: Flat affect;Restless Overall Cognitive Status: Impaired/Different from baseline Area of Impairment: Orientation;Attention;Memory;Following commands;Safety/judgement;Awareness;Problem solving                 Orientation Level: Disoriented to;Time;Place;Situation Current Attention Level: Sustained Memory: Decreased short-term memory Following Commands: Follows one step commands consistently;Follows one step commands with increased time Safety/Judgement: Decreased awareness of deficits;Decreased awareness of safety Awareness: Intellectual Problem Solving: Requires verbal cues;Requires tactile cues        Exercises      General Comments        Pertinent  Vitals/Pain Pain Assessment: Faces Faces Pain Scale: No hurt    Home Living                      Prior Function            PT Goals (current goals can now be found in the care plan section) Acute Rehab PT Goals Patient Stated Goal:  go home to [place] his dog Progress towards PT goals: Progressing toward goals    Frequency    Min 3X/week      PT Plan Discharge plan needs to be updated    Co-evaluation              AM-PAC PT "6 Clicks" Mobility   Outcome Measure  Help needed turning from your back to your side while in a flat bed without using bedrails?: None Help needed moving from lying on your back to sitting on the side of a flat bed without using bedrails?: None Help needed moving to and from a bed to a chair (including a wheelchair)?: A Little Help needed standing up from a chair using your arms (e.g., wheelchair or bedside chair)?: A Little Help needed to walk in hospital room?: A Little Help needed climbing 3-5 steps with a railing? : A Little 6 Click Score: 20    End of Session Equipment Utilized During Treatment: Gait belt Activity Tolerance: Patient tolerated treatment well Patient left: in bed;with call bell/phone within reach;with bed alarm set;with restraints reapplied Nurse Communication: Mobility status PT Visit Diagnosis: Unsteadiness on feet (R26.81);Muscle weakness (generalized) (M62.81)     Time: 5993-5701 PT Time Calculation (min) (ACUTE ONLY): 20 min  Charges:  $Gait Training: 8-22 mins                     [place] Pager [phone] Office [place] 06/18/2020, 12:52 PM

## 2020-06-18 NOTE — Progress Notes (Signed)
PT Cancellation Note  Patient Details Name: Patrick Phillips MRN: [id number] DOB: [birth year]   Cancelled Treatment:    Reason Eval/Treat Not Completed: Other (comment). Nurse reports she just gave Ativan. Will continue attempts.   [place] 06/18/2020, 9:59 AM [place] Pager [phone] Office [phone]

## 2020-06-18 NOTE — Plan of Care (Signed)
Patient is currently resting in bed. VSS. Restraints placed over night due to patient getting OOB and not following directions. Patient found standing in room near door when bed alarm was going off. Patient very strong and aggressive. Once bed time meds were given patient was more calm, just very restless. Attempted to take off restraints this AM, moments later patient pulled off all equipment and trying to get out of bed. Bed alarm on. Frequent rounding performed. Call bell taken away d/t patient using it as a weapon.   Problem: Safety: Goal: Non-violent Restraint(s) Outcome: Progressing   Problem: Education: Goal: Knowledge of risk factors and measures for prevention of condition will improve Outcome: Progressing   Problem: Coping: Goal: Psychosocial and spiritual needs will be supported Outcome: Progressing   Problem: Respiratory: Goal: Will maintain a patent airway Outcome: Progressing Goal: Complications related to the disease process, condition or treatment will be avoided or minimized Outcome: Progressing

## 2020-06-18 NOTE — Progress Notes (Signed)
[name] (daughter) called and updated.

## 2020-06-18 NOTE — Progress Notes (Signed)
Initial Nutrition Assessment  DOCUMENTATION CODES:   Not applicable  INTERVENTION:   Continue supplements:  Ensure Enlive po BID, each supplement provides 350 kcal and 20 grams of protein  Magic cup BID with meals, each supplement provides 290 kcal and 9 grams of protein  MVI with minerals daily  NUTRITION DIAGNOSIS:   Increased nutrient needs related to acute illness (COVID) as evidenced by estimated needs.  Ongoing  GOAL:   Patient will meet greater than or equal to 90% of their needs   Progressing  MONITOR:   PO intake,Labs,Weight trends,I & O's  REASON FOR ASSESSMENT:   Malnutrition Screening Tool    ASSESSMENT:   Pt admitted with acute metabolic encephalopathy, likely 2/2 febrile illness/COVID-19 infection. PMH includes HTN, DM, Ca, dementia.  Patient with ongoing confusion. He is on a regular diet. Meal intakes: 25-100% Drinking Ensure Enlive/Plus BID. Receiving magic cups with meals BID. Labs and medications reviewed.  Diet Order:   Diet Order            Diet regular Room service appropriate? Yes; Fluid consistency: Thin  Diet effective now                 EDUCATION NEEDS:   No education needs have been identified at this time  Skin:  Skin Assessment: Reviewed RN Assessment  Last BM:  4/21  Height:   Ht Readings from Last 1 Encounters:  06/10/20 6' (1.829 m)    Weight:   Wt Readings from Last 1 Encounters:  06/10/20 86.2 kg   BMI:  Body mass index is 25.77 kg/m.  Estimated Nutritional Needs:   Kcal:  1950-2150  Protein:  100-110 grams  Fluid:  >1.95L/d   Lucas Mallow, RD, LDN, CNSC Please refer to Amion for contact information.

## 2020-06-18 NOTE — Consult Note (Signed)
  Reason for Consult: '' severe delirium-please assist in med management.'' Patient is seen and assessed face to face on 2 separate occasions this morning.  Patient was originally seen lying in bed, resting well; patient was later seen lying in bed sitting upright feeding himself breakfast with his nurse tech at the bedside.  Patient continues to remain alert and oriented to self.  He is able to answer questions appropriately, although there is a delay.  Patient does tell me " I am doing good."  In his affect is congruent and appropriate.  Patient denies any hallucinations, psychosis, delusions, and or paranoia.  Per nursing staff he does well when family is present, however they are not able to stay all day.  Patient continues to exhibit a certain degree of delirium and or acute metabolic encephalopathy.  Will contact his daughter [name] for an update.  Treatment Plan -During chart review it was discovered that nursing staff found ticks on patient's body.  It remains unclear how long the tics have been on his body, however he has been in the hospital approximately 7 days plus his ER stay and visit to [facility].  Did discuss with attending Ghimire, it is worth excluding for tickborne illnesses considering his presentation -Continue to Recommend 1:1 safety sitter as she remains confused, restless and agitated. Safety sitters offer supportive interaction with patient, and can easily identify patient needs, reorient, and address environmental factors that contribute to the delirium. If patient requires restraints, recommend a safety sitter also.   -Continue delirium precaution Continue to monitor and treat underlying medical causes of delirium, including infection, electrolyte disturbances, etc. -Patient continues to become agitated and restless at times, which is expected. Prior to this admission he was very functional and mobile, it will be difficult to keep him confined to the bed. No safety sitters are  available at this time. Will continue prn medication at this time.   - Delirium precautions           - Maintain hydration, oxygenation, nutrition           - Limit use of restraints and catheters           - Normalize sleep patterns by minimizing nighttime noise, light and interruptions by             clustering care, opening blinds during the day           - Reorient the patient frequently, provide easily visible clock and calendar           - Encourage ambulation (PT ordered), regular activities and visitors to maintain cognitive stimulation.            - Recommend treating medical problems and then discharging back to the facility.  -Continue recommendations with physical therapy, early mobilization does help with cognitive stimulation and improves delirium. 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 QTc 450 -Increase Depakote sprinkles 375mg  po BID. Depakote level obtained yesterday 0.29.   -Recommend TOC consult, to evaluate for skilled nursing facility.

## 2020-06-18 NOTE — Progress Notes (Signed)
  Speech Language Pathology Treatment: Dysphagia  Patient Details Name: Patrick Phillips MRN: [id number] DOB: 1940-08-06 Today's Date: 06/18/2020 Time: 4742-5956 SLP Time Calculation (min) (ACUTE ONLY): 10.45 min  Assessment / Plan / Recommendation Clinical Impression  Pt was seen for dysphagia treatment. He was lethargic during the session with increased confusion compared to the initial evaluation. Pt exhibited difficulty maintaining an adequate level of alertness and his participation was limited. Pt required encouragement to accept boluses and intermittent cues to maintain adequate level of alertness. The session was abbreviated due to pt's refusal of additional boluses. Mastication was mildly prolonged with dysphagia 3 and regular texture solids, but oral clearance was adequate. Pt demonstrated throat clearing with dual consistency boluses and inconsistently with thin liquids via straw. The impact of his level of alertness and mentation on his performance is strongly considered. Pt's current diet of regular texture solids and thin liquids will be continued at this time, but SLP will continue to follow pt to ensure safety.    HPI HPI: Pt is a 80 y.o. male with PMHx of vascular dementia, CVA, DM-2, HTN. On 4/14, pt took his dog for a walk and the dog returned home without him. Pt was found lying on the side of road with no memory of what had happened but able to follow commands. Pt was taken to [facility] and d/c home with daughter following negative workup, but pt was brough to [facility] ED due to increased confusion. Pt found to be febrile and COVID+ and admitted for acute metabolic encephalopathy. MRI brain 4/14 negative for acute changes. CXR 4/15 negative for active disease. EEG 4/16: mild to moderate diffuse encephalopathy, nonspecific etiology. No seizures or epileptiform discharges      SLP Plan  Continue with current plan of care       Recommendations  Diet recommendations: Regular;Thin liquid Liquids  provided via: Cup;Straw Medication Administration: Whole meds with liquid Supervision: Staff to assist with self feeding Compensations: Minimize environmental distractions;Slow rate;Small sips/bites Postural Changes and/or Swallow Maneuvers: Seated upright 90 degrees                Oral Care Recommendations: Oral care BID Follow up Recommendations:  (TBD) SLP Visit Diagnosis: Dysphagia, unspecified (R13.10) Plan: Continue with current plan of care       Patrick Phillips I. Patrick Phillips, Patrick Phillips, Patrick Phillips Office number [phone] Pager [place] 06/18/2020, 5:52 PM

## 2020-06-19 DIAGNOSIS — I10 Essential (primary) hypertension: Secondary | ICD-10-CM | POA: Diagnosis not present

## 2020-06-19 LAB — GLUCOSE, CAPILLARY
Glucose-Capillary: 128 mg/dL — ABNORMAL HIGH (ref 70–99)
Glucose-Capillary: 178 mg/dL — ABNORMAL HIGH (ref 70–99)
Glucose-Capillary: 226 mg/dL — ABNORMAL HIGH (ref 70–99)

## 2020-06-19 MED ORDER — KCL IN DEXTROSE-NACL 20-5-0.45 MEQ/L-%-% IV SOLN
INTRAVENOUS | Status: DC
  Filled 2020-06-19: qty 1000

## 2020-06-19 MED ORDER — VITAMIN B-12 1000 MCG PO TABS
1000.0000 ug | ORAL_TABLET | Freq: Every day | ORAL | Status: DC
  Administered 2020-06-20 – 2020-07-14 (×25): 1000 ug via ORAL
  Filled 2020-06-19 (×24): qty 1

## 2020-06-19 NOTE — Progress Notes (Addendum)
PROGRESS NOTE                                                                                                                                                                                                             Patient Demographics:    Patrick Phillips, is a 80 y.o. male, DOB - 03/24/1940, [id number]  Outpatient Primary MD for the patient is [name], MD   Admit date - 06/10/2020   LOS - 8  Chief Complaint  Patient presents with  . Altered Mental Status       Brief Narrative: Patient is a 80 y.o. male with PMHx of vascular dementia, prior CVA, DM-2, HTN-who presented with fever and confusion.  COVID-19 vaccinated status: Unknown  Significant Events: 4/14>> Admit to [facility] for fever/confusion  Significant studies: 4/14>>Chest x-ray: No PNA 4/14>> CT head: No acute intracranial abnormality. 4/14>> CT C-spine: No fracture 4/14>> MRI brain: No acute infarct. 4/15>> chest x-ray: No PNA 4/15>> CSF: WBC 1 (tube #4), protein 46, glucose 54 4/16>> EEG: Mild to moderate diffuse encephalopathy-no seizures.  COVID-19 medications: Remdesivir: 4/15>>  Antibiotics: Vancomycin: 4/14 x1 Ceftriaxone: 4/14 x 1 Ampicillin: 4/14 x 1  Microbiology data: 4/14 >>blood culture: No growth 4/14>> urine culture: No growth 4/15>> blood culture: No growth 4/15>> urine culture: Multiple species 4/15>> CSF culture: No growth 4/15>> CSF PCR HSV 1/2: Negative. 4/15>> CSF HSV culture: Negative 4/15>> CSF arbovirus IgG: Negative 4/15>> CSF VDRL: Negative  Procedures: 4/15>> lumbar puncture in the emergency room  Consults:  Neurology, psychiatry  DVT prophylaxis: enoxaparin (LOVENOX) injection 40 mg Start: 06/11/20 0600    Subjective:   Patient in bed this morning, appears more drowsy and somnolent, in no distress, unreliable historian at this morning.   Assessment  & Plan :   Acute metabolic  encephalopathy-superimposed on dementia with delirium: Encephalopathy on presentation was likely related to febrile illness/COVID-19 infection-all culture data negative so far.  CT head, MRI brain and EEG unremarkable. He has had no fever since admission-his encephalopathy that he had on admission has resolved-I suspect his confusion/agitation and now is related to delirium associated with dementia.    Evaluated by psychiatry and started on Saphris and Depakote-since he continues to have episodes of breakthrough delirium-note he received some Ativan on 06/18/2020 after which he is extremely drowsy morning of 06/19/2020, will rely  on as needed Haldol and minimize the use of benzodiazepines or narcotics.  Gentle IV fluids on 06/19/2020.   ? Tick Bite: 2 ticks found last night by nursing staff and patient- will discuss with ID-if patient needs empiric Doxy coverage.RMSF added to CSF  SIRS-COVID-19 infection: Completed Remdesivir x3 days.  Not hypoxic-no pneumonia-does not require any further therapy.  Interestingly-patient apparently had COVID-19 infection in February as well.  No results for input(s): DDIMER, FERRITIN, LDH, CRP in the last 72 hours.  Lab Results  Component Value Date   [place] (A) 06/11/2020   [place] NEGATIVE 01/13/2020       AKI: Mild-likely hemodynamically mediated-resolved with IV fluids.  Borderline vitamin B12 deficiency: On supplementation.  Hypokalemia: Repleted-recheck periodically.  HTN: BP remains elevated-add low-dose Coreg-continue amlodipine.  Follow and optimize.    DM-2: CBGs relatively stable-continue SSI-oral hypoglycemic agents on hold  Recent Labs    06/18/20 1738 06/18/20 2025 06/19/20 0745  GLUCAP 189* 184* 128*   History of CVA-MRI brain negative for infarct-on aspirin/statin.  Dementia: On Namenda  GERD: On PPI    Condition - Extremely Guarded  Family Communication  :   Daughter Misty-[phone]-updated over the  phone on 4/22   Daughter-Brenda-[phone] at bedside on 4/21, 06/19/20   Code Status :  Full Code  Diet :  Diet Order            Diet regular Room service appropriate? Yes; Fluid consistency: Thin  Diet effective now                  Disposition Plan  :   Status is: Inpatient  Remains inpatient appropriate because:Inpatient level of care appropriate due to severity of illness   Dispo: The patient is from: Home              Anticipated d/c is to: Home              Patient currently is not medically stable to d/c.   Difficult to place patient No   Barriers to discharge: Resolving encephalopathy-awaiting final culture data.  Antimicorbials  :    Anti-infectives (From admission, onward)   Start     Dose/Rate Route Frequency Ordered Stop   06/18/20 1745  doxycycline (VIBRAMYCIN) 100 mg in sodium chloride 0.9 % 250 mL IVPB        100 mg 125 mL/hr over 120 Minutes Intravenous Every 12 hours 06/18/20 1648     06/12/20 1000  remdesivir 100 mg in sodium chloride 0.9 % 100 mL IVPB        100 mg 200 mL/hr over 30 Minutes Intravenous Daily 06/11/20 0435 06/13/20 0900   06/11/20 0445  remdesivir 200 mg in sodium chloride 0.9% 250 mL IVPB        200 mg 580 mL/hr over 30 Minutes Intravenous Once 06/11/20 0434 06/11/20 0929   06/11/20 0230  vancomycin (VANCOREADY) IVPB 1500 mg/300 mL        1,500 mg 150 mL/hr over 120 Minutes Intravenous  Once 06/11/20 0229 06/11/20 0812   06/11/20 0215  ampicillin (OMNIPEN) 2 g in sodium chloride 0.9 % 100 mL IVPB        2 g 300 mL/hr over 20 Minutes Intravenous  Once 06/11/20 0157 06/11/20 0550   06/11/20 0215  vancomycin (VANCOREADY) IVPB 1000 mg/200 mL  Status:  Discontinued        1,000 mg 200 mL/hr over 60 Minutes Intravenous  Once 06/11/20 0157 06/11/20 0229   06/11/20  0200  cefTRIAXone (ROCEPHIN) 2 g in sodium chloride 0.9 % 100 mL IVPB        2 g 200 mL/hr over 30 Minutes Intravenous  Once 06/11/20 0157 06/11/20 0248       Inpatient Medications  Scheduled Meds: . amLODipine  10 mg Oral Daily  . asenapine  5 mg Sublingual BID  . aspirin EC  81 mg Oral Daily  . atorvastatin  40 mg Oral Daily  . carvedilol  3.125 mg Oral BID WC  . cyanocobalamin  1,000 mcg Subcutaneous Daily  . divalproex  375 mg Oral Q12H  . enoxaparin (LOVENOX) injection  40 mg Subcutaneous Q24H  . feeding supplement  237 mL Oral BID BM  . insulin aspart  0-9 Units Subcutaneous TID WC  . melatonin  5 mg Oral QHS  . multivitamin with minerals  1 tablet Oral Daily  . pantoprazole  40 mg Oral Daily  . thiamine  100 mg Oral Daily   Continuous Infusions: . doxycycline (VIBRAMYCIN) IV Stopped (06/19/20 1029)   PRN Meds:.acetaminophen, haloperidol lactate, hydrALAZINE, [DISCONTINUED] ondansetron **OR** ondansetron (ZOFRAN) IV   Time Spent in minutes  25    See all Orders from today for further details   Lala Lund M.D on 06/19/2020 at 12:03 PM  To page go to [email or web address] - use universal password  [facility] -  Office  [phone]    Objective:   Vitals:   06/18/20 2021 06/18/20 2327 06/19/20 0338 06/19/20 0750  BP: (!) 172/81 (!) 169/82 (!) 142/65 (!) 145/77  Pulse: 77 72 64 63  Resp: 20 20 20 16   Temp: 98.3 F (36.8 C) 98.3 F (36.8 C) 98.2 F (36.8 C) 98 F (36.7 C)  TempSrc: Axillary Axillary Axillary Axillary  SpO2: 97% 98% 97% 96%  Weight:      Height:        Wt Readings from Last 3 Encounters:  06/10/20 86.2 kg  06/10/20 86.2 kg  04/19/20 86.2 kg     Intake/Output Summary (Last 24 hours) at 06/19/2020 1203 Last data filed at 06/19/2020 1029 Gross per 24 hour  Intake 620 ml  Output 550 ml  Net 70 ml     Physical Exam  Somnolent and drowsy this morning, moving all 4 extremities to painful stimuli, in no distress [place].AT,PERRAL Supple Neck,No JVD, No cervical lymphadenopathy appriciated.  Symmetrical Chest wall movement, Good air movement bilaterally, CTAB RRR,No Gallops, Rubs or new  Murmurs, No Parasternal Heave +ve B.Sounds, Abd Soft, No tenderness, No organomegaly appriciated, No rebound - guarding or rigidity. No Cyanosis, Clubbing or edema, No new Rash or bruise    Data Review:    CBC Recent Labs  Lab 06/13/20 0449 06/14/20 0140  WBC 5.2 5.6  HGB 12.6* 12.0*  HCT 36.9* 36.3*  PLT 165 164  MCV 84.1 86.4  MCH 28.7 28.6  MCHC 34.1 33.1  RDW 13.8 14.1  LYMPHSABS 0.9 1.5  MONOABS 0.3 0.4  EOSABS 0.0 0.1  BASOSABS 0.0 0.0    Chemistries  Recent Labs  Lab 06/13/20 0449 06/14/20 0140 06/15/20 0204 06/18/20 0418  NA 138 141 138 137  K 3.2* 3.3* 3.4* 3.9  CL 108 109 105 103  CO2 21* 22 27 25   GLUCOSE 108* 108* 145* 160*  BUN 12 17 19 18   CREATININE 1.03 1.05 1.04 0.97  CALCIUM 8.6* 8.7* 8.8* 9.1  MG  --   --  2.0  --   AST 74* 61*  --   --  ALT 38 34  --   --   ALKPHOS 40 39  --   --   BILITOT 0.8 0.7  --   --    ------------------------------------------------------------------------------------------------------------------ No results for input(s): CHOL, HDL, LDLCALC, TRIG, CHOLHDL, LDLDIRECT in the last 72 hours.  Lab Results  Component Value Date   HGBA1C 6.6 (H) 06/11/2020   ------------------------------------------------------------------------------------------------------------------ No results for input(s): TSH, T4TOTAL, T3FREE, THYROIDAB in the last 72 hours.  Invalid input(s): FREET3 ------------------------------------------------------------------------------------------------------------------ No results for input(s): VITAMINB12, FOLATE, FERRITIN, TIBC, IRON, RETICCTPCT in the last 72 hours.  Coagulation profile No results for input(s): INR, PROTIME in the last 168 hours.  No results for input(s): DDIMER in the last 72 hours.  Cardiac Enzymes No results for input(s): CKMB, TROPONINI, MYOGLOBIN in the last 168 hours.  Invalid input(s):  CK ------------------------------------------------------------------------------------------------------------------    Component Value Date/Time   BNP 25.0 10/10/2017 2103    Micro Results Recent Results (from the past 240 hour(s))  Culture, blood (Routine X 2) w Reflex to ID Panel     Status: None   Collection Time: 06/10/20 11:32 AM   Specimen: BLOOD  Result Value Ref Range Status   Specimen Description BLOOD RIGHT ANTECUBITAL  Final   Special Requests   Final    Blood Culture adequate volume BOTTLES DRAWN AEROBIC AND ANAEROBIC   Culture   Final    NO GROWTH 5 DAYS Performed at [facility], [address], [place], [place] [address]    Report Status 06/15/2020 FINAL  Final  Culture, blood (Routine X 2) w Reflex to ID Panel     Status: None   Collection Time: 06/10/20 11:33 AM   Specimen: BLOOD  Result Value Ref Range Status   Specimen Description BLOOD BLOOD RIGHT WRIST  Final   Special Requests   Final    Blood Culture adequate volume BOTTLES DRAWN AEROBIC AND ANAEROBIC   Culture   Final    NO GROWTH 5 DAYS Performed at [facility], [address] [place], [place] [address]    Report Status 06/15/2020 FINAL  Final  Urine Culture     Status: None   Collection Time: 06/10/20  1:05 PM   Specimen: Urine, Clean Catch  Result Value Ref Range Status   Specimen Description   Final    URINE, CLEAN CATCH Performed at [facility], [address], [place], [place] [address]    Special Requests   Final    NONE Performed at [facility], [address] [place], [place] [address]    Culture   Final    NO GROWTH Performed at [facility], [place] [address] [place], [place] [address]    Report Status 06/11/2020 FINAL  Final  Blood Culture (routine x 2)     Status: None   Collection Time: 06/11/20  1:00 AM   Specimen: BLOOD LEFT ARM  Result Value Ref Range Status   Specimen Description BLOOD LEFT ARM  Final   Special Requests   Final    BOTTLES DRAWN AEROBIC AND  ANAEROBIC Blood Culture adequate volume   Culture   Final    NO GROWTH 5 DAYS Performed at [facility], [place] [address], [place],  [address]    Report Status 06/16/2020 FINAL  Final  Urine culture     Status: Abnormal   Collection Time: 06/11/20  1:49 AM   Specimen: In/Out Cath Urine  Result Value Ref Range Status   Specimen Description IN/OUT CATH URINE  Final   Special Requests   Final    NONE Performed at [facility], [place] [address], [place], [place] [address]    Culture MULTIPLE SPECIES PRESENT, SUGGEST RECOLLECTION (A)  Final   Report Status 06/12/2020 FINAL  Final  Blood Culture (routine x 2)     Status: None   Collection Time: 06/11/20  2:00 AM   Specimen: BLOOD RIGHT ARM  Result Value Ref Range Status   Specimen Description BLOOD RIGHT ARM  Final   Special Requests   Final    BOTTLES DRAWN AEROBIC AND ANAEROBIC Blood Culture adequate volume   Culture   Final    NO GROWTH 5 DAYS Performed at [facility], [place] [address], [place], [place] [address]    Report Status 06/16/2020 FINAL  Final  Resp Panel by RT-PCR (Flu A&B, Covid) Nasopharyngeal Swab     Status: Abnormal   Collection Time: 06/11/20  2:29 AM   Specimen: Nasopharyngeal Swab; Nasopharyngeal(NP) swabs in vial transport medium  Result Value Ref Range Status   SARS Coronavirus 2 by RT PCR POSITIVE (A) NEGATIVE Final    Comment: RESULT CALLED TO, READ BACK BY AND VERIFIED WITH: K MOON RN 06/11/20 0411 JDW (NOTE) SARS-CoV-2 target nucleic acids are DETECTED.  The SARS-CoV-2 RNA is generally detectable in upper respiratory specimens during the acute phase of infection. Positive results are indicative of the presence of the identified virus, but do not rule out bacterial infection or co-infection with other pathogens not detected by the test. Clinical correlation with patient history and other diagnostic information is necessary to determine patient infection status. The expected result  is Negative.  Fact Sheet for Patients: EntrepreneurPulse.com.au  Fact Sheet for Healthcare Providers: IncredibleEmployment.be  This test is not yet approved or cleared by the [place] FDA and  has been authorized for detection and/or diagnosis of SARS-CoV-2 by FDA under an Emergency Use Authorization (EUA).  This EUA will remain in effect (meaning this test can be used)  for the duration of  the COVID-19 declaration under Section 564(b)(1) of the Act, 21 U.S.C. section 360bbb-3(b)(1), unless the authorization is terminated or revoked sooner.     Influenza A by PCR NEGATIVE NEGATIVE Final   Influenza B by PCR NEGATIVE NEGATIVE Final    Comment: (NOTE) The Xpert Xpress SARS-CoV-2/FLU/RSV plus assay is intended as an aid in the diagnosis of influenza from Nasopharyngeal swab specimens and should not be used as a sole basis for treatment. Nasal washings and aspirates are unacceptable for Xpert Xpress SARS-CoV-2/FLU/RSV testing.  Fact Sheet for Patients: EntrepreneurPulse.com.au  Fact Sheet for Healthcare Providers: IncredibleEmployment.be  This test is not yet approved or cleared by the [place] FDA and has been authorized for detection and/or diagnosis of SARS-CoV-2 by FDA under an Emergency Use Authorization (EUA). This EUA will remain in effect (meaning this test can be used) for the duration of the COVID-19 declaration under Section 564(b)(1) of the Act, 21 U.S.C. section 360bbb-3(b)(1), unless the authorization is terminated or revoked.  Performed at [facility], [place] [address], [place], [place] [address]   CSF culture     Status: None   Collection Time: 06/11/20  5:20 AM   Specimen: CSF; Cerebrospinal Fluid  Result Value Ref Range Status   Specimen Description CSF  Final   Special Requests NONE  Final   Gram Stain NO WBC SEEN NO ORGANISMS SEEN CYTOSPIN SMEAR   Final   Culture    Final  NO GROWTH 3 DAYS Performed at [facility], [place] [address] [place], [place] [address]    Report Status 06/14/2020 FINAL  Final  Hsv Culture And Typing     Status: None   Collection Time: 06/11/20  5:20 AM   Specimen: Back; Cerebrospinal Fluid  Result Value Ref Range Status   HSV Culture/Type Comment  Final    Comment: (NOTE) Negative No Herpes simplex virus isolated. Performed At: [facility] [place], [place] [id number] Rush Farmer MD [id number]    Source of Sample CSF  Final    Comment: Performed at [facility], [place] [address], [place], [place] [address]    Radiology Reports CT Head Wo Contrast  Result Date: 06/10/2020 CLINICAL DATA:  Head and C-spine injury, fall, altered mental status EXAM: CT HEAD WITHOUT CONTRAST CT CERVICAL SPINE WITHOUT CONTRAST TECHNIQUE: Multidetector CT imaging of the head and cervical spine was performed following the standard protocol without intravenous contrast. Multiplanar CT image reconstructions of the cervical spine were also generated. COMPARISON:  05/04/2020 FINDINGS: CT HEAD FINDINGS Brain: Stable mild brain atrophy pattern. No acute intracranial hemorrhage, new mass lesion, acute infarction, midline shift, herniation, hydrocephalus, or extra-axial fluid collection. No focal mass effect or edema. Cisterns are patent. Cerebellar atrophy as well. Vascular: Intracranial atherosclerosis at the skull base. No hyperdense vessel. Skull: Normal. Negative for fracture or focal lesion. Sinuses/Orbits: Minor scattered sinus mucosal thickening as before. Orbits are symmetric. No acute finding. Other: None. CT CERVICAL SPINE FINDINGS Alignment: Normal. Skull base and vertebrae: No acute fracture. No primary bone lesion or focal pathologic process. Soft tissues and spinal canal: Normal prevertebral soft tissues. No hemorrhage or hematoma appreciated. Carotid atherosclerosis. Disc levels: Similar mild multilevel  degenerative disc disease and facet arthropathy. Facets are aligned. No subluxation or dislocation. Preserved vertebral body heights. Upper chest: Negative. Other: None. IMPRESSION: Stable brain atrophy pattern. No acute intracranial abnormality or interval change by noncontrast CT. Stable mild cervical degenerative changes as above. No acute osseous finding or malalignment by CT. Electronically Signed   By: Jerilynn Mages.  Shick M.D.   On: 06/10/2020 12:31   CT Cervical Spine Wo Contrast  Result Date: 06/10/2020 CLINICAL DATA:  Head and C-spine injury, fall, altered mental status EXAM: CT HEAD WITHOUT CONTRAST CT CERVICAL SPINE WITHOUT CONTRAST TECHNIQUE: Multidetector CT imaging of the head and cervical spine was performed following the standard protocol without intravenous contrast. Multiplanar CT image reconstructions of the cervical spine were also generated. COMPARISON:  05/04/2020 FINDINGS: CT HEAD FINDINGS Brain: Stable mild brain atrophy pattern. No acute intracranial hemorrhage, new mass lesion, acute infarction, midline shift, herniation, hydrocephalus, or extra-axial fluid collection. No focal mass effect or edema. Cisterns are patent. Cerebellar atrophy as well. Vascular: Intracranial atherosclerosis at the skull base. No hyperdense vessel. Skull: Normal. Negative for fracture or focal lesion. Sinuses/Orbits: Minor scattered sinus mucosal thickening as before. Orbits are symmetric. No acute finding. Other: None. CT CERVICAL SPINE FINDINGS Alignment: Normal. Skull base and vertebrae: No acute fracture. No primary bone lesion or focal pathologic process. Soft tissues and spinal canal: Normal prevertebral soft tissues. No hemorrhage or hematoma appreciated. Carotid atherosclerosis. Disc levels: Similar mild multilevel degenerative disc disease and facet arthropathy. Facets are aligned. No subluxation or dislocation. Preserved vertebral body heights. Upper chest: Negative. Other: None. IMPRESSION: Stable brain  atrophy pattern. No acute intracranial abnormality or interval change by noncontrast CT. Stable mild cervical degenerative changes as above. No acute osseous finding or malalignment by CT.  Electronically Signed   By: Jerilynn Mages.  Shick M.D.   On: 06/10/2020 12:31   MR Brain Wo Contrast (neuro protocol)  Result Date: 06/10/2020 CLINICAL DATA:  Acute neuro deficit.  Mental status change. EXAM: MRI HEAD WITHOUT CONTRAST TECHNIQUE: Multiplanar, multiecho pulse sequences of the brain and surrounding structures were obtained without intravenous contrast. COMPARISON:  CT head 06/10/2020 FINDINGS: Brain: Ventricle size and cerebral volume normal for age. Negative for acute infarct. No significant chronic ischemic change. Negative for hemorrhage or mass. Image quality degraded by mild motion. Vascular: Partial loss of flow void in the left internal carotid artery through the skull base and the distal left vertebral artery which may be due to slow flow or stenosis. Skull and upper cervical spine: Negative Sinuses/Orbits: Mild mucosal edema paranasal sinuses. No orbital mass. Bilateral exophthalmos. Other: None IMPRESSION: Negative for acute infarct. Partial loss of flow void in the left internal carotid artery distal left vertebral artery which could be due to proximal stenosis. Consider CT angio head and neck for further evaluation. Electronically Signed   By: Franchot Gallo M.D.   On: 06/10/2020 15:09   DG Chest Port 1 View  Result Date: 06/11/2020 CLINICAL DATA:  Questionable sepsis. EXAM: PORTABLE CHEST 1 VIEW COMPARISON:  June 10, 2020 FINDINGS: There is no evidence of acute infiltrate, pleural effusion or pneumothorax. The heart size and mediastinal contours are within normal limits. A radiopaque loop recorder device is seen. The visualized skeletal structures are unremarkable. IMPRESSION: No active disease. Electronically Signed   By: Virgina Norfolk M.D.   On: 06/11/2020 03:10   DG Chest Port 1 View  Result  Date: 06/10/2020 CLINICAL DATA:  Altered mental status. EXAM: PORTABLE CHEST 1 VIEW COMPARISON:  May 25, 2020 FINDINGS: Loop recorder in place. Cardiomediastinal silhouette is normal. Mediastinal contours appear intact. Tortuosity and calcific atherosclerotic disease of the aorta. There is no evidence of focal airspace consolidation, pleural effusion or pneumothorax. Osseous structures are without acute abnormality. Soft tissues are grossly normal. IMPRESSION: No active disease. Electronically Signed   By: Fidela Salisbury M.D.   On: 06/10/2020 11:15   EEG adult  Result Date: 06/12/2020 Lora Havens, MD     06/12/2020  3:07 PM Patient Name: Patrick Phillips MRN: [id number] Epilepsy Attending: Lora Havens Referring Physician/Provider: Dr Oren Binet Date: 06/12/2020 Duration: 25.42 mins Patient history: 80yo M with ams. EEG to evaluate for seizure Level of alertness: Awake AEDs during EEG study: None Technical aspects: This EEG study was done with scalp electrodes positioned according to the 10-20 International system of electrode placement. Electrical activity was acquired at a sampling rate of 500Hz  and reviewed with a high frequency filter of 70Hz  and a low frequency filter of 1Hz . EEG data were recorded continuously and digitally stored. Description: No posterior dominant rhythm was seen. EEG showed continuous generalized 5 to 6 Hz theta slowing. Hyperventilation and photic stimulation were not performed.   ABNORMALITY - Continuous slow, generalized IMPRESSION: This study is suggestive of mild to moderate diffuse encephalopathy, nonspecific etiology. No seizures or epileptiform discharges were seen throughout the recording. Priyanka Barbra Sarks   DG Hips Bilat W or Wo Pelvis 3-4 Views  Result Date: 06/10/2020 CLINICAL DATA:  Found on side of road, last seen normal at 2130 hours last night, fall, dementia, tenderness of hips bilaterally EXAM: DG HIP (WITH OR WITHOUT PELVIS) 3-4V BILAT COMPARISON:   None FINDINGS: Osseous demineralization. Hip and SI joint spaces preserved. No acute fracture, dislocation, or bone destruction. Mild  degenerative disc disease changes at visualized lower lumbar spine. IMPRESSION: No acute osseous abnormalities. Electronically Signed   By: Lavonia Dana M.D.   On: 06/10/2020 11:48

## 2020-06-19 NOTE — Consult Note (Addendum)
Reason for Consult: '' severe delirium-please assist in med management.''  Patient seen and assessed face to face today.  He was sitting upright in bed and requested that I let him leave to go home.  He remains in restraints and hand mitts.  He is calm and offered this provider $5,000 if I would help him leave.  Pleasantly confused at times, consistent with delirium.  Based on this assessment, no changes recommended except clearance of the medical trigger causing this event as he was highly functioning prior to this admission.  Recommend ambulating him and removal of restraints to decrease confusion in the hospital, will communicate this to his provider, along with a sitter to assist with orienting him when needed.    Waylan Boga, PMHNP  Per note yesterday by Sheran Fava: Patient was originally seen lying in bed, resting well; patient was later seen lying in bed sitting upright feeding himself breakfast with his nurse tech at the bedside.  Patient continues to remain alert and oriented to self.  He is able to answer questions appropriately, although there is a delay.  Patient does tell me " I am doing good."  In his affect is congruent and appropriate.  Patient denies any hallucinations, psychosis, delusions, and or paranoia.   Per nursing staff he does well when family is present, however they are not able to stay all day.  Patient continues to exhibit a certain degree of delirium and or acute metabolic encephalopathy.  Will contact his daughter [name] for an update.  Treatment Plan -During chart review it was discovered that nursing staff found ticks on patient's body.  It remains unclear how long the tics have been on his body, however he has been in the hospital approximately 7 days plus his ER stay and visit to [facility].  Did discuss with attending Ghimire, it is worth excluding for tickborne illnesses considering his presentation -Continue to Recommend 1:1 safety sitter as she remains  confused, restless and agitated. Safety sitters offer supportive interaction with patient, and can easily identify patient needs, reorient, and address environmental factors that contribute to the delirium. If patient requires restraints, recommend a safety sitter also.   -Continue delirium precaution Continue to monitor and treat underlying medical causes of delirium, including infection, electrolyte disturbances, etc. -Patient continues to become agitated and restless at times, which is expected. Prior to this admission he was very functional and mobile, it will be difficult to keep him confined to the bed. No safety sitters are available at this time. Will continue prn medication at this time.   - Delirium precautions           - Maintain hydration, oxygenation, nutrition           - Limit use of restraints and catheters           - Normalize sleep patterns by minimizing nighttime noise, light and interruptions by             clustering care, opening blinds during the day           - Reorient the patient frequently, provide easily visible clock and calendar           - Encourage ambulation (PT ordered), regular activities and visitors to maintain cognitive stimulation.            - Recommend treating medical problems and then discharging back to the facility.  -Continue recommendations with physical therapy, early mobilization does help with cognitive stimulation and improves  delirium. 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 QTc 450 -Increase Depakote sprinkles 375mg  po BID. Depakote level obtained yesterday 0.29.   -Recommend TOC consult, to evaluate for skilled nursing facility.

## 2020-06-20 ENCOUNTER — Inpatient Hospital Stay (HOSPITAL_COMMUNITY): Payer: Medicare Other

## 2020-06-20 DIAGNOSIS — I10 Essential (primary) hypertension: Secondary | ICD-10-CM | POA: Diagnosis not present

## 2020-06-20 LAB — COMPREHENSIVE METABOLIC PANEL
ALT: 98 U/L — ABNORMAL HIGH (ref 0–44)
AST: 71 U/L — ABNORMAL HIGH (ref 15–41)
Albumin: 3.3 g/dL — ABNORMAL LOW (ref 3.5–5.0)
Alkaline Phosphatase: 53 U/L (ref 38–126)
Anion gap: 8 (ref 5–15)
BUN: 17 mg/dL (ref 8–23)
CO2: 24 mmol/L (ref 22–32)
Calcium: 9.3 mg/dL (ref 8.9–10.3)
Chloride: 106 mmol/L (ref 98–111)
Creatinine, Ser: 1.02 mg/dL (ref 0.61–1.24)
GFR, Estimated: >60 mL/min (ref >60)
Glucose, Bld: 185 mg/dL — ABNORMAL HIGH (ref 70–99)
Potassium: 4.6 mmol/L (ref 3.5–5.1)
Sodium: 138 mmol/L (ref 135–145)
Total Bilirubin: 0.9 mg/dL (ref 0.3–1.2)
Total Protein: 6.1 g/dL — ABNORMAL LOW (ref 6.5–8.1)

## 2020-06-20 LAB — GLUCOSE, CAPILLARY
Glucose-Capillary: 146 mg/dL — ABNORMAL HIGH (ref 70–99)
Glucose-Capillary: 115 mg/dL — ABNORMAL HIGH (ref 70–99)
Glucose-Capillary: 185 mg/dL — ABNORMAL HIGH (ref 70–99)

## 2020-06-20 LAB — CBC WITH DIFFERENTIAL/PLATELET
Abs Immature Granulocytes: 0.1 10*3/uL — ABNORMAL HIGH (ref 0.00–0.07)
Basophils Absolute: 0 10*3/uL (ref 0.0–0.1)
Basophils Relative: 0 %
Eosinophils Absolute: 0.2 10*3/uL (ref 0.0–0.5)
Eosinophils Relative: 2 %
HCT: 43 % (ref 39.0–52.0)
Hemoglobin: 14.2 g/dL (ref 13.0–17.0)
Immature Granulocytes: 1 %
Lymphocytes Relative: 36 %
Lymphs Abs: 3.4 10*3/uL (ref 0.7–4.0)
MCH: 28.5 pg (ref 26.0–34.0)
MCHC: 33 g/dL (ref 30.0–36.0)
MCV: 86.2 fL (ref 80.0–100.0)
Monocytes Absolute: 0.6 10*3/uL (ref 0.1–1.0)
Monocytes Relative: 6 %
Neutro Abs: 5.2 10*3/uL (ref 1.7–7.7)
Neutrophils Relative %: 55 %
Platelets: 206 10*3/uL (ref 150–400)
RBC: 4.99 MIL/uL (ref 4.22–5.81)
RDW: 13.5 % (ref 11.5–15.5)
WBC: 9.5 10*3/uL (ref 4.0–10.5)
nRBC: 0 % (ref 0.0–0.2)

## 2020-06-20 LAB — C-REACTIVE PROTEIN: CRP: <0.5 mg/dL (ref <1.0)

## 2020-06-20 LAB — BRAIN NATRIURETIC PEPTIDE: B Natriuretic Peptide: 33.2 pg/mL (ref 0.0–100.0)

## 2020-06-20 LAB — MAGNESIUM: Magnesium: 2.1 mg/dL (ref 1.7–2.4)

## 2020-06-20 MED ORDER — HYDRALAZINE HCL 20 MG/ML IJ SOLN
5.0000 mg | Freq: Four times a day (QID) | INTRAMUSCULAR | Status: DC | PRN
  Administered 2020-06-21: 5 mg via INTRAVENOUS
  Filled 2020-06-20: qty 1

## 2020-06-20 MED ORDER — ASENAPINE MALEATE 5 MG SL SUBL
5.0000 mg | SUBLINGUAL_TABLET | Freq: Two times a day (BID) | SUBLINGUAL | Status: DC
  Administered 2020-06-20 – 2020-06-22 (×4): 5 mg via SUBLINGUAL
  Filled 2020-06-20 (×8): qty 1

## 2020-06-20 NOTE — Consult Note (Signed)
Reason for Consult: ''severe delirium-please assist in med management.''  Patient sleeping on rounds today.  His RN reports his behaviors start about 2 pm with agitation. According to the hospitalist, Dr Candiss Norse, Ativan makes him worse.  Moved his am dose of Saphris to 1 pm vs 8 am starting tomorrow as he may be having some early sundowning, re-evaluate tomorrow afternoon.  Recommend ambulating him and removal of restraints to decrease confusion in the hospital along with a sitter to assist with orienting him when needed (communicated to Dr Candiss Norse on 4/23).    Waylan Boga, PMHNP  Per note yesterday by Sheran Fava: Patient was originally seen lying in bed, resting well; patient was later seen lying in bed sitting upright feeding himself breakfast with his nurse tech at the bedside.  Patient continues to remain alert and oriented to self.  He is able to answer questions appropriately, although there is a delay.  Patient does tell me " I am doing good."  In his affect is congruent and appropriate.  Patient denies any hallucinations, psychosis, delusions, and or paranoia.   Per nursing staff he does well when family is present, however they are not able to stay all day.  Patient continues to exhibit a certain degree of delirium and or acute metabolic encephalopathy.  Will contact his daughter [name] for an update.  Treatment Plan -During chart review it was discovered that nursing staff found ticks on patient's body.  It remains unclear how long the tics have been on his body, however he has been in the hospital approximately 7 days plus his ER stay and visit to [facility].  Did discuss with attending Ghimire, it is worth excluding for tickborne illnesses considering his presentation -Continue to Recommend 1:1 safety sitter as she remains confused, restless and agitated. Safety sitters offer supportive interaction with patient, and can easily identify patient needs, reorient, and address  environmental factors that contribute to the delirium. If patient requires restraints, recommend a safety sitter also.   -Continue delirium precaution Continue to monitor and treat underlying medical causes of delirium, including infection, electrolyte disturbances, etc. -Patient continues to become agitated and restless at times, which is expected. Prior to this admission he was very functional and mobile, it will be difficult to keep him confined to the bed. No safety sitters are available at this time. Will continue prn medication at this time.   - Delirium precautions           - Maintain hydration, oxygenation, nutrition           - Limit use of restraints and catheters           - Normalize sleep patterns by minimizing nighttime noise, light and interruptions by             clustering care, opening blinds during the day           - Reorient the patient frequently, provide easily visible clock and calendar           - Encourage ambulation (PT ordered), regular activities and visitors to maintain cognitive stimulation.            - Recommend treating medical problems and then discharging back to the facility.  -Continue recommendations with physical therapy, early mobilization does help with cognitive stimulation and improves delirium. 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 QTc 450 -Increase Depakote sprinkles 375mg  po BID. Depakote level obtained yesterday 0.29.   -  Recommend TOC consult, to evaluate for skilled nursing facility.  Waylan Boga, PMHNP

## 2020-06-20 NOTE — Progress Notes (Signed)
PROGRESS NOTE                                                                                                                                                                                                             Patient Demographics:    Patrick Phillips, is a 80 y.o. male, DOB - 03-30-1940, [id number]  Outpatient Primary MD for the patient is [name], MD   Admit date - 06/10/2020   LOS - 9  Chief Complaint  Patient presents with  . Altered Mental Status       Brief Narrative: Patient is a 80 y.o. male with PMHx of vascular dementia, prior CVA, DM-2, HTN-who presented with fever and confusion.  COVID-19 vaccinated status: Unknown  Significant Events: 4/14>> Admit to [facility] for fever/confusion  Significant studies: 4/14>>Chest x-ray: No PNA 4/14>> CT head: No acute intracranial abnormality. 4/14>> CT C-spine: No fracture 4/14>> MRI brain: No acute infarct. 4/15>> chest x-ray: No PNA 4/15>> CSF: WBC 1 (tube #4), protein 46, glucose 54 4/16>> EEG: Mild to moderate diffuse encephalopathy-no seizures.  COVID-19 medications: Remdesivir: 4/15>>  Antibiotics: Vancomycin: 4/14 x1 Ceftriaxone: 4/14 x 1 Ampicillin: 4/14 x 1  Microbiology data: 4/14 >>blood culture: No growth 4/14>> urine culture: No growth 4/15>> blood culture: No growth 4/15>> urine culture: Multiple species 4/15>> CSF culture: No growth 4/15>> CSF PCR HSV 1/2: Negative. 4/15>> CSF HSV culture: Negative 4/15>> CSF arbovirus IgG: Negative 4/15>> CSF VDRL: Negative  Procedures: 4/15>> lumbar puncture in the emergency room  Consults:  Neurology, psychiatry  DVT prophylaxis: enoxaparin (LOVENOX) injection 40 mg Start: 06/11/20 0600    Subjective:   Patient remains in bed appears to be in no distress, he is still somnolent but less than what he was yesterday, unreliable historian but denies any headache chest or abdominal  pain.   Assessment  & Plan :   Acute metabolic encephalopathy-superimposed on dementia with delirium: Encephalopathy on presentation was likely related to febrile illness/COVID-19 infection-all culture data negative so far.  CT head, MRI brain and EEG unremarkable. He has had no fever since admission-his encephalopathy that he had on admission has resolved-I suspect his confusion/agitation and now is related to delirium associated with dementia.    Evaluated by psychiatry and started on Saphris and Depakote-since he continues to have episodes of breakthrough delirium, note he was extremely  somnolent on 06/19/2020 after he received some Ativan, Ativan has been stopped, he is slightly more arousable on 06/20/2020, may have to drop Depakote dose but will defer that to psych.  No focal deficits   ? Tick Bite: 2 ticks found last night by nursing staff and patient- will discuss with ID-if patient needs empiric IV Doxy coverage.RMSF added to CSF   AKI: Mild-likely hemodynamically mediated-resolved with IV fluids.  Borderline vitamin B12 deficiency: On supplementation.  Hypokalemia: Repleted-recheck periodically.  HTN: BP remains elevated-add low-dose Coreg-continue amlodipine.  Follow and optimize.  SIRS-COVID-19 infection: Completed Remdesivir x3 days.  Not hypoxic-no pneumonia-does not require any further therapy.  Interestingly-patient apparently had COVID-19 infection in February as well.  Recent Labs    06/20/20 0110  CRP <0.5    Lab Results  Component Value Date   SARSCOV2NAA POSITIVE (A) 06/11/2020   [place] NEGATIVE 01/13/2020   DM-2: CBGs relatively stable-continue SSI-oral hypoglycemic agents on hold  Recent Labs    06/19/20 1214 06/19/20 1626 06/20/20 0806  GLUCAP 178* 226* 146*   History of CVA-MRI brain negative for infarct-on aspirin/statin.  Dementia: On Namenda  GERD: On PPI    Condition - Extremely Guarded  Family Communication  :   Daughter  Misty-[phone]-updated over the phone on 4/22   Daughter-Brenda-[phone] at bedside on 4/21, 06/19/20   Code Status :  Full Code  Diet :  Diet Order            Diet regular Room service appropriate? Yes; Fluid consistency: Thin  Diet effective now                  Disposition Plan  :   Status is: Inpatient  Remains inpatient appropriate because:Inpatient level of care appropriate due to severity of illness   Dispo: The patient is from: Home              Anticipated d/c is to: Home              Patient currently is not medically stable to d/c.   Difficult to place patient No   Barriers to discharge: Resolving encephalopathy-awaiting final culture data.  Antimicorbials  :    Anti-infectives (From admission, onward)   Start     Dose/Rate Route Frequency Ordered Stop   06/18/20 1745  doxycycline (VIBRAMYCIN) 100 mg in sodium chloride 0.9 % 250 mL IVPB        100 mg 125 mL/hr over 120 Minutes Intravenous Every 12 hours 06/18/20 1648     06/12/20 1000  remdesivir 100 mg in sodium chloride 0.9 % 100 mL IVPB        100 mg 200 mL/hr over 30 Minutes Intravenous Daily 06/11/20 0435 06/13/20 0900   06/11/20 0445  remdesivir 200 mg in sodium chloride 0.9% 250 mL IVPB        200 mg 580 mL/hr over 30 Minutes Intravenous Once 06/11/20 0434 06/11/20 0929   06/11/20 0230  vancomycin (VANCOREADY) IVPB 1500 mg/300 mL        1,500 mg 150 mL/hr over 120 Minutes Intravenous  Once 06/11/20 0229 06/11/20 0812   06/11/20 0215  ampicillin (OMNIPEN) 2 g in sodium chloride 0.9 % 100 mL IVPB        2 g 300 mL/hr over 20 Minutes Intravenous  Once 06/11/20 0157 06/11/20 0550   06/11/20 0215  vancomycin (VANCOREADY) IVPB 1000 mg/200 mL  Status:  Discontinued        1,000 mg 200 mL/hr over  60 Minutes Intravenous  Once 06/11/20 0157 06/11/20 0229   06/11/20 0200  cefTRIAXone (ROCEPHIN) 2 g in sodium chloride 0.9 % 100 mL IVPB        2 g 200 mL/hr over 30 Minutes Intravenous  Once 06/11/20  0157 06/11/20 0248      Inpatient Medications  Scheduled Meds: . amLODipine  10 mg Oral Daily  . asenapine  5 mg Sublingual BID  . aspirin EC  81 mg Oral Daily  . atorvastatin  40 mg Oral Daily  . carvedilol  3.125 mg Oral BID WC  . divalproex  375 mg Oral Q12H  . enoxaparin (LOVENOX) injection  40 mg Subcutaneous Q24H  . feeding supplement  237 mL Oral BID BM  . insulin aspart  0-9 Units Subcutaneous TID WC  . melatonin  5 mg Oral QHS  . multivitamin with minerals  1 tablet Oral Daily  . pantoprazole  40 mg Oral Daily  . thiamine  100 mg Oral Daily  . vitamin B-12  1,000 mcg Oral Daily   Continuous Infusions: . doxycycline (VIBRAMYCIN) IV 100 mg (06/20/20 0833)   PRN Meds:.acetaminophen, haloperidol lactate, hydrALAZINE, [DISCONTINUED] ondansetron **OR** ondansetron (ZOFRAN) IV   Time Spent in minutes  25    See all Orders from today for further details   Lala Lund M.D on 06/20/2020 at 10:54 AM  To page go to [email or web address] - use universal password  [facility] -  Office  [phone]    Objective:   Vitals:   06/19/20 1627 06/19/20 1942 06/20/20 0009 06/20/20 0802  BP: (!) 152/78  (!) 158/74 (!) 170/86  Pulse: 71 68 61 68  Resp: 20 20 16 19   Temp: 98.4 F (36.9 C) 97.8 F (36.6 C) 98.2 F (36.8 C) (!) 97.5 F (36.4 C)  TempSrc: Axillary Oral Axillary Axillary  SpO2: 96% 95% 95% 98%  Weight:      Height:        Wt Readings from Last 3 Encounters:  06/10/20 86.2 kg  06/10/20 86.2 kg  04/19/20 86.2 kg     Intake/Output Summary (Last 24 hours) at 06/20/2020 1054 Last data filed at 06/20/2020 0859 Gross per 24 hour  Intake 1293.96 ml  Output 450 ml  Net 843.96 ml     Physical Exam  Somnolent but more arousable than yesterday, moving all 4 extremities to painful stimuli, no apparent focal weakness [place].AT,PERRAL Supple Neck,No JVD, No cervical lymphadenopathy appriciated.  Symmetrical Chest wall movement, Good air movement bilaterally,  CTAB RRR,No Gallops, Rubs or new Murmurs, No Parasternal Heave +ve B.Sounds, Abd Soft, No tenderness, No organomegaly appriciated, No rebound - guarding or rigidity. No Cyanosis, Clubbing or edema, No new Rash or bruise    Data Review:    CBC Recent Labs  Lab 06/14/20 0140 06/20/20 0110  WBC 5.6 9.5  HGB 12.0* 14.2  HCT 36.3* 43.0  PLT 164 206  MCV 86.4 86.2  MCH 28.6 28.5  MCHC 33.1 33.0  RDW 14.1 13.5  LYMPHSABS 1.5 3.4  MONOABS 0.4 0.6  EOSABS 0.1 0.2  BASOSABS 0.0 0.0    Chemistries  Recent Labs  Lab 06/14/20 0140 06/15/20 0204 06/18/20 0418 06/20/20 0110  NA 141 138 137 138  K 3.3* 3.4* 3.9 4.6  CL 109 105 103 106  CO2 22 27 25 24   GLUCOSE 108* 145* 160* 185*  BUN 17 19 18 17   CREATININE 1.05 1.04 0.97 1.02  CALCIUM 8.7* 8.8* 9.1 9.3  MG  --  2.0  --  2.1  AST 61*  --   --  71*  ALT 34  --   --  98*  ALKPHOS 39  --   --  53  BILITOT 0.7  --   --  0.9   ------------------------------------------------------------------------------------------------------------------ No results for input(s): CHOL, HDL, LDLCALC, TRIG, CHOLHDL, LDLDIRECT in the last 72 hours.  Lab Results  Component Value Date   HGBA1C 6.6 (H) 06/11/2020   ------------------------------------------------------------------------------------------------------------------ No results for input(s): TSH, T4TOTAL, T3FREE, THYROIDAB in the last 72 hours.  Invalid input(s): FREET3 ------------------------------------------------------------------------------------------------------------------ No results for input(s): VITAMINB12, FOLATE, FERRITIN, TIBC, IRON, RETICCTPCT in the last 72 hours.  Coagulation profile No results for input(s): INR, PROTIME in the last 168 hours.  No results for input(s): DDIMER in the last 72 hours.  Cardiac Enzymes No results for input(s): CKMB, TROPONINI, MYOGLOBIN in the last 168 hours.  Invalid input(s):  CK ------------------------------------------------------------------------------------------------------------------    Component Value Date/Time   BNP 33.2 06/20/2020 0110    Micro Results Recent Results (from the past 240 hour(s))  Culture, blood (Routine X 2) w Reflex to ID Panel     Status: None   Collection Time: 06/10/20 11:32 AM   Specimen: BLOOD  Result Value Ref Range Status   Specimen Description BLOOD RIGHT ANTECUBITAL  Final   Special Requests   Final    Blood Culture adequate volume BOTTLES DRAWN AEROBIC AND ANAEROBIC   Culture   Final    NO GROWTH 5 DAYS Performed at [facility], [address], [place], [place] [address]    Report Status 06/15/2020 FINAL  Final  Culture, blood (Routine X 2) w Reflex to ID Panel     Status: None   Collection Time: 06/10/20 11:33 AM   Specimen: BLOOD  Result Value Ref Range Status   Specimen Description BLOOD BLOOD RIGHT WRIST  Final   Special Requests   Final    Blood Culture adequate volume BOTTLES DRAWN AEROBIC AND ANAEROBIC   Culture   Final    NO GROWTH 5 DAYS Performed at [facility], [address], [place], [place] [address]    Report Status 06/15/2020 FINAL  Final  Urine Culture     Status: None   Collection Time: 06/10/20  1:05 PM   Specimen: Urine, Clean Catch  Result Value Ref Range Status   Specimen Description   Final    URINE, CLEAN CATCH Performed at [facility], [address] [place], [place] [address]    Special Requests   Final    NONE Performed at [facility], [address], [place], [place] [address]    Culture   Final    NO GROWTH Performed at [facility], [place] [address], [place], [place] [address]    Report Status 06/11/2020 FINAL  Final  Blood Culture (routine x 2)     Status: None   Collection Time: 06/11/20  1:00 AM   Specimen: BLOOD LEFT ARM  Result Value Ref Range Status   Specimen Description BLOOD LEFT ARM  Final   Special Requests   Final    BOTTLES DRAWN AEROBIC AND  ANAEROBIC Blood Culture adequate volume   Culture   Final    NO GROWTH 5 DAYS Performed at [facility], [place] [address], [place],  [address]    Report Status 06/16/2020 FINAL  Final  Urine culture     Status: Abnormal   Collection Time: 06/11/20  1:49 AM   Specimen: In/Out  Cath Urine  Result Value Ref Range Status   Specimen Description IN/OUT CATH URINE  Final   Special Requests   Final    NONE Performed at [facility], [address], [place], [place] [address]    Culture MULTIPLE SPECIES PRESENT, SUGGEST RECOLLECTION (A)  Final   Report Status 06/12/2020 FINAL  Final  Blood Culture (routine x 2)     Status: None   Collection Time: 06/11/20  2:00 AM   Specimen: BLOOD RIGHT ARM  Result Value Ref Range Status   Specimen Description BLOOD RIGHT ARM  Final   Special Requests   Final    BOTTLES DRAWN AEROBIC AND ANAEROBIC Blood Culture adequate volume   Culture   Final    NO GROWTH 5 DAYS Performed at [facility], [place] [address] [place], [place] [address]    Report Status 06/16/2020 FINAL  Final  Resp Panel by RT-PCR (Flu A&B, Covid) Nasopharyngeal Swab     Status: Abnormal   Collection Time: 06/11/20  2:29 AM   Specimen: Nasopharyngeal Swab; Nasopharyngeal(NP) swabs in vial transport medium  Result Value Ref Range Status   SARS Coronavirus 2 by RT PCR POSITIVE (A) NEGATIVE Final    Comment: RESULT CALLED TO, READ BACK BY AND VERIFIED WITH: K MOON RN 06/11/20 0411 JDW (NOTE) SARS-CoV-2 target nucleic acids are DETECTED.  The SARS-CoV-2 RNA is generally detectable in upper respiratory specimens during the acute phase of infection. Positive results are indicative of the presence of the identified virus, but do not rule out bacterial infection or co-infection with other pathogens not detected by the test. Clinical correlation with patient history and other diagnostic information is necessary to determine patient infection status. The expected result  is Negative.  Fact Sheet for Patients: EntrepreneurPulse.com.au  Fact Sheet for Healthcare Providers: IncredibleEmployment.be  This test is not yet approved or cleared by the [place] FDA and  has been authorized for detection and/or diagnosis of SARS-CoV-2 by FDA under an Emergency Use Authorization (EUA).  This EUA will remain in effect (meaning this test can be used)  for the duration of  the COVID-19 declaration under Section 564(b)(1) of the Act, 21 U.S.C. section 360bbb-3(b)(1), unless the authorization is terminated or revoked sooner.     Influenza A by PCR NEGATIVE NEGATIVE Final   Influenza B by PCR NEGATIVE NEGATIVE Final    Comment: (NOTE) The Xpert Xpress SARS-CoV-2/FLU/RSV plus assay is intended as an aid in the diagnosis of influenza from Nasopharyngeal swab specimens and should not be used as a sole basis for treatment. Nasal washings and aspirates are unacceptable for Xpert Xpress SARS-CoV-2/FLU/RSV testing.  Fact Sheet for Patients: EntrepreneurPulse.com.au  Fact Sheet for Healthcare Providers: IncredibleEmployment.be  This test is not yet approved or cleared by the [place] FDA and has been authorized for detection and/or diagnosis of SARS-CoV-2 by FDA under an Emergency Use Authorization (EUA). This EUA will remain in effect (meaning this test can be used) for the duration of the COVID-19 declaration under Section 564(b)(1) of the Act, 21 U.S.C. section 360bbb-3(b)(1), unless the authorization is terminated or revoked.  Performed at [facility], [place] [address] [place], [place] [address]   CSF culture     Status: None   Collection Time: 06/11/20  5:20 AM   Specimen: CSF; Cerebrospinal Fluid  Result Value Ref Range Status   Specimen Description CSF  Final   Special Requests NONE  Final   Gram Stain NO WBC  SEEN NO ORGANISMS SEEN CYTOSPIN SMEAR   Final   Culture    Final    NO GROWTH 3 DAYS Performed at [facility], [place] [address], [place], [place] [address]    Report Status 06/14/2020 FINAL  Final  Hsv Culture And Typing     Status: None   Collection Time: 06/11/20  5:20 AM   Specimen: Back; Cerebrospinal Fluid  Result Value Ref Range Status   HSV Culture/Type Comment  Final    Comment: (NOTE) Negative No Herpes simplex virus isolated. Performed At: [facility] [place], [place] [id number] Rush Farmer MD [id number]    Source of Sample CSF  Final    Comment: Performed at [facility], [place] [address] [place], [place] [address]    Radiology Reports CT Head Wo Contrast  Result Date: 06/10/2020 CLINICAL DATA:  Head and C-spine injury, fall, altered mental status EXAM: CT HEAD WITHOUT CONTRAST CT CERVICAL SPINE WITHOUT CONTRAST TECHNIQUE: Multidetector CT imaging of the head and cervical spine was performed following the standard protocol without intravenous contrast. Multiplanar CT image reconstructions of the cervical spine were also generated. COMPARISON:  05/04/2020 FINDINGS: CT HEAD FINDINGS Brain: Stable mild brain atrophy pattern. No acute intracranial hemorrhage, new mass lesion, acute infarction, midline shift, herniation, hydrocephalus, or extra-axial fluid collection. No focal mass effect or edema. Cisterns are patent. Cerebellar atrophy as well. Vascular: Intracranial atherosclerosis at the skull base. No hyperdense vessel. Skull: Normal. Negative for fracture or focal lesion. Sinuses/Orbits: Minor scattered sinus mucosal thickening as before. Orbits are symmetric. No acute finding. Other: None. CT CERVICAL SPINE FINDINGS Alignment: Normal. Skull base and vertebrae: No acute fracture. No primary bone lesion or focal pathologic process. Soft tissues and spinal canal: Normal prevertebral soft tissues. No hemorrhage or hematoma appreciated. Carotid atherosclerosis. Disc levels: Similar mild multilevel  degenerative disc disease and facet arthropathy. Facets are aligned. No subluxation or dislocation. Preserved vertebral body heights. Upper chest: Negative. Other: None. IMPRESSION: Stable brain atrophy pattern. No acute intracranial abnormality or interval change by noncontrast CT. Stable mild cervical degenerative changes as above. No acute osseous finding or malalignment by CT. Electronically Signed   By: Jerilynn Mages.  Shick M.D.   On: 06/10/2020 12:31   CT Cervical Spine Wo Contrast  Result Date: 06/10/2020 CLINICAL DATA:  Head and C-spine injury, fall, altered mental status EXAM: CT HEAD WITHOUT CONTRAST CT CERVICAL SPINE WITHOUT CONTRAST TECHNIQUE: Multidetector CT imaging of the head and cervical spine was performed following the standard protocol without intravenous contrast. Multiplanar CT image reconstructions of the cervical spine were also generated. COMPARISON:  05/04/2020 FINDINGS: CT HEAD FINDINGS Brain: Stable mild brain atrophy pattern. No acute intracranial hemorrhage, new mass lesion, acute infarction, midline shift, herniation, hydrocephalus, or extra-axial fluid collection. No focal mass effect or edema. Cisterns are patent. Cerebellar atrophy as well. Vascular: Intracranial atherosclerosis at the skull base. No hyperdense vessel. Skull: Normal. Negative for fracture or focal lesion. Sinuses/Orbits: Minor scattered sinus mucosal thickening as before. Orbits are symmetric. No acute finding. Other: None. CT CERVICAL SPINE FINDINGS Alignment: Normal. Skull base and vertebrae: No acute fracture. No primary bone lesion or focal pathologic process. Soft tissues and spinal canal: Normal prevertebral soft tissues. No hemorrhage or hematoma appreciated. Carotid atherosclerosis. Disc levels: Similar mild multilevel degenerative disc disease and facet arthropathy. Facets are aligned. No subluxation or dislocation. Preserved vertebral body heights. Upper chest: Negative. Other: None. IMPRESSION: Stable brain  atrophy pattern. No acute intracranial abnormality or interval  change by noncontrast CT. Stable mild cervical degenerative changes as above. No acute osseous finding or malalignment by CT. Electronically Signed   By: Jerilynn Mages.  Shick M.D.   On: 06/10/2020 12:31   MR Brain Wo Contrast (neuro protocol)  Result Date: 06/10/2020 CLINICAL DATA:  Acute neuro deficit.  Mental status change. EXAM: MRI HEAD WITHOUT CONTRAST TECHNIQUE: Multiplanar, multiecho pulse sequences of the brain and surrounding structures were obtained without intravenous contrast. COMPARISON:  CT head 06/10/2020 FINDINGS: Brain: Ventricle size and cerebral volume normal for age. Negative for acute infarct. No significant chronic ischemic change. Negative for hemorrhage or mass. Image quality degraded by mild motion. Vascular: Partial loss of flow void in the left internal carotid artery through the skull base and the distal left vertebral artery which may be due to slow flow or stenosis. Skull and upper cervical spine: Negative Sinuses/Orbits: Mild mucosal edema paranasal sinuses. No orbital mass. Bilateral exophthalmos. Other: None IMPRESSION: Negative for acute infarct. Partial loss of flow void in the left internal carotid artery distal left vertebral artery which could be due to proximal stenosis. Consider CT angio head and neck for further evaluation. Electronically Signed   By: Franchot Gallo M.D.   On: 06/10/2020 15:09   DG Chest Port 1 View  Result Date: 06/20/2020 CLINICAL DATA:  Shortness of breath EXAM: PORTABLE CHEST 1 VIEW COMPARISON:  06/11/2020 FINDINGS: Loop recorder is identified adjacent to the cardiac apex. Normal heart size. No pleural effusion or edema. No airspace densities identified. No acute osseous findings. IMPRESSION: No acute cardiopulmonary abnormalities. Electronically Signed   By: Kerby Moors M.D.   On: 06/20/2020 08:34   DG Chest Port 1 View  Result Date: 06/11/2020 CLINICAL DATA:  Questionable sepsis. EXAM:  PORTABLE CHEST 1 VIEW COMPARISON:  June 10, 2020 FINDINGS: There is no evidence of acute infiltrate, pleural effusion or pneumothorax. The heart size and mediastinal contours are within normal limits. A radiopaque loop recorder device is seen. The visualized skeletal structures are unremarkable. IMPRESSION: No active disease. Electronically Signed   By: Virgina Norfolk M.D.   On: 06/11/2020 03:10   DG Chest Port 1 View  Result Date: 06/10/2020 CLINICAL DATA:  Altered mental status. EXAM: PORTABLE CHEST 1 VIEW COMPARISON:  May 25, 2020 FINDINGS: Loop recorder in place. Cardiomediastinal silhouette is normal. Mediastinal contours appear intact. Tortuosity and calcific atherosclerotic disease of the aorta. There is no evidence of focal airspace consolidation, pleural effusion or pneumothorax. Osseous structures are without acute abnormality. Soft tissues are grossly normal. IMPRESSION: No active disease. Electronically Signed   By: Fidela Salisbury M.D.   On: 06/10/2020 11:15   EEG adult  Result Date: 06/12/2020 Lora Havens, MD     06/12/2020  3:07 PM Patient Name: Roberth Berling MRN: [id number] Epilepsy Attending: Lora Havens Referring Physician/Provider: Dr Oren Binet Date: 06/12/2020 Duration: 25.42 mins Patient history: 80yo M with ams. EEG to evaluate for seizure Level of alertness: Awake AEDs during EEG study: None Technical aspects: This EEG study was done with scalp electrodes positioned according to the 10-20 International system of electrode placement. Electrical activity was acquired at a sampling rate of 500Hz  and reviewed with a high frequency filter of 70Hz  and a low frequency filter of 1Hz . EEG data were recorded continuously and digitally stored. Description: No posterior dominant rhythm was seen. EEG showed continuous generalized 5 to 6 Hz theta slowing. Hyperventilation and photic stimulation were not performed.   ABNORMALITY - Continuous slow, generalized IMPRESSION: This  study  is suggestive of mild to moderate diffuse encephalopathy, nonspecific etiology. No seizures or epileptiform discharges were seen throughout the recording. Priyanka Barbra Sarks   DG Hips Bilat W or Wo Pelvis 3-4 Views  Result Date: 06/10/2020 CLINICAL DATA:  Found on side of road, last seen normal at 2130 hours last night, fall, dementia, tenderness of hips bilaterally EXAM: DG HIP (WITH OR WITHOUT PELVIS) 3-4V BILAT COMPARISON:  None FINDINGS: Osseous demineralization. Hip and SI joint spaces preserved. No acute fracture, dislocation, or bone destruction. Mild degenerative disc disease changes at visualized lower lumbar spine. IMPRESSION: No acute osseous abnormalities. Electronically Signed   By: Lavonia Dana M.D.   On: 06/10/2020 11:48

## 2020-06-21 DIAGNOSIS — I10 Essential (primary) hypertension: Secondary | ICD-10-CM | POA: Diagnosis not present

## 2020-06-21 DIAGNOSIS — G9341 Metabolic encephalopathy: Secondary | ICD-10-CM | POA: Diagnosis not present

## 2020-06-21 LAB — CBC WITH DIFFERENTIAL/PLATELET
Abs Immature Granulocytes: 0.08 10*3/uL — ABNORMAL HIGH (ref 0.00–0.07)
Basophils Absolute: 0 10*3/uL (ref 0.0–0.1)
Basophils Relative: 0 %
Eosinophils Absolute: 0.2 10*3/uL (ref 0.0–0.5)
Eosinophils Relative: 2 %
HCT: 42.2 % (ref 39.0–52.0)
Hemoglobin: 13.9 g/dL (ref 13.0–17.0)
Immature Granulocytes: 1 %
Lymphocytes Relative: 36 %
Lymphs Abs: 3.6 10*3/uL (ref 0.7–4.0)
MCH: 28.1 pg (ref 26.0–34.0)
MCHC: 32.9 g/dL (ref 30.0–36.0)
MCV: 85.4 fL (ref 80.0–100.0)
Monocytes Absolute: 0.7 10*3/uL (ref 0.1–1.0)
Monocytes Relative: 7 %
Neutro Abs: 5.4 10*3/uL (ref 1.7–7.7)
Neutrophils Relative %: 54 %
Platelets: 230 10*3/uL (ref 150–400)
RBC: 4.94 MIL/uL (ref 4.22–5.81)
RDW: 13.6 % (ref 11.5–15.5)
WBC: 10 10*3/uL (ref 4.0–10.5)
nRBC: 0 % (ref 0.0–0.2)

## 2020-06-21 LAB — GLUCOSE, CAPILLARY
Glucose-Capillary: 167 mg/dL — ABNORMAL HIGH (ref 70–99)
Glucose-Capillary: 149 mg/dL — ABNORMAL HIGH (ref 70–99)
Glucose-Capillary: 134 mg/dL — ABNORMAL HIGH (ref 70–99)
Glucose-Capillary: 142 mg/dL — ABNORMAL HIGH (ref 70–99)
Glucose-Capillary: 159 mg/dL — ABNORMAL HIGH (ref 70–99)

## 2020-06-21 LAB — COMPREHENSIVE METABOLIC PANEL
ALT: 74 U/L — ABNORMAL HIGH (ref 0–44)
AST: 40 U/L (ref 15–41)
Albumin: 3.2 g/dL — ABNORMAL LOW (ref 3.5–5.0)
Alkaline Phosphatase: 52 U/L (ref 38–126)
Anion gap: 9 (ref 5–15)
BUN: 19 mg/dL (ref 8–23)
CO2: 23 mmol/L (ref 22–32)
Calcium: 9.3 mg/dL (ref 8.9–10.3)
Chloride: 108 mmol/L (ref 98–111)
Creatinine, Ser: 1.11 mg/dL (ref 0.61–1.24)
GFR, Estimated: >60 mL/min (ref >60)
Glucose, Bld: 134 mg/dL — ABNORMAL HIGH (ref 70–99)
Potassium: 4.2 mmol/L (ref 3.5–5.1)
Sodium: 140 mmol/L (ref 135–145)
Total Bilirubin: 0.7 mg/dL (ref 0.3–1.2)
Total Protein: 6 g/dL — ABNORMAL LOW (ref 6.5–8.1)

## 2020-06-21 LAB — BRAIN NATRIURETIC PEPTIDE: B Natriuretic Peptide: 24.9 pg/mL (ref 0.0–100.0)

## 2020-06-21 LAB — MAGNESIUM: Magnesium: 2.1 mg/dL (ref 1.7–2.4)

## 2020-06-21 LAB — C-REACTIVE PROTEIN: CRP: 0.5 mg/dL (ref <1.0)

## 2020-06-21 MED ORDER — HYDRALAZINE HCL 20 MG/ML IJ SOLN
10.0000 mg | Freq: Four times a day (QID) | INTRAMUSCULAR | Status: DC | PRN
  Administered 2020-06-22: 10 mg via INTRAVENOUS
  Filled 2020-06-21: qty 1

## 2020-06-21 MED ORDER — CARVEDILOL 6.25 MG PO TABS
6.2500 mg | ORAL_TABLET | Freq: Two times a day (BID) | ORAL | Status: DC
  Administered 2020-06-21 – 2020-07-14 (×44): 6.25 mg via ORAL
  Filled 2020-06-21 (×21): qty 1
  Filled 2020-06-21: qty 2
  Filled 2020-06-21 (×25): qty 1

## 2020-06-21 NOTE — Progress Notes (Signed)
  Speech Language Pathology Treatment: Dysphagia  Patient Details Name: Patrick Phillips MRN: [id number] DOB: 04-08-1940 Today's Date: 06/21/2020 Time: 0962-8366 SLP Time Calculation (min) (ACUTE ONLY): 15 min  Assessment / Plan / Recommendation Clinical Impression  Pt seen today for ongoing dysphagia management.  Pt awake, alert, but remains confused. NT reported no difficulty with breakfast, but also noted that pt refused beverages at that time. Vocal quality very hoarse today.  Today pt tolerated puree, regular solids, and thin liquid by cup.   With straw sips of thin liquid there was immediate cough on 3 of 3 trials.  Coughing was eliminated with cup sips.  Recommend continuing regular texture diet with thin liquids.  Thin liquid by cup sips only.   HPI HPI: Pt is a 80 y.o. male with PMHx of vascular dementia, CVA, DM-2, HTN. On 4/14, pt took his dog for a walk and the dog returned home without him. Pt was found lying on the side of road with no memory of what had happened but able to follow commands. Pt was taken to [facility] and d/c home with daughter following negative workup, but pt was brough to [facility] ED due to increased confusion. Pt found to be febrile and COVID+ and admitted for acute metabolic encephalopathy. MRI brain 4/14 negative for acute changes. CXR 4/15 negative for active disease. EEG 4/16: mild to moderate diffuse encephalopathy, nonspecific etiology. No seizures or epileptiform discharges. Psych consulted due to severe delirium.      SLP Plan  Continue with current plan of care       Recommendations  Diet recommendations: Regular;Thin liquid Liquids provided via: Cup;No straw Medication Administration:  (No specific precautions) Compensations: Minimize environmental distractions;Slow rate;Small sips/bites Postural Changes and/or Swallow Maneuvers: Seated upright 90 degrees                Oral Care Recommendations: Oral care BID Follow up Recommendations:  (TBD) Plan:  Continue with current plan of care       [place], [place], [place] Office: [phone] 06/21/2020, 10:45 AM

## 2020-06-21 NOTE — TOC Progression Note (Addendum)
Transition of Care [facility]) - Progression Note    Patient Details  Name: Patrick Phillips MRN: [id number] Date of Birth: 04-Apr-1940  Transition of Care [facility] CM/SW [place], [place] Phone Number: 06/21/2020, 3:40 PM  Clinical Narrative:     CSW contacted [facility]; they are unable to accept pt because he is not vaccinated and they do not have a quarantine bed on their memory care unit.   CSW called pt daughter and updated her that [facility] cannot accept pt due to vaccination status. CSW explained that Psychiatry has recommended [facility]. She is agreeable to [facility] referrals. CSW faxed referrals to [facility], and [facility].   Expected Discharge Plan: Memory Care Barriers to Discharge: Continued Medical Work up  Expected Discharge Plan and Services Expected Discharge Plan: Memory Care In-house Referral: Clinical Social Work Discharge Planning Services: CM Consult Post Acute Care Choice: Nursing Home Living arrangements for the past 2 months: Single Family Home                                       Social Determinants of Health (SDOH) Interventions    Readmission Risk Interventions No flowsheet data found.

## 2020-06-21 NOTE — Consult Note (Signed)
  Reason for Consult: ''severe delirium-please assist in med management.''  Patient observed sitting upright in bed, requesting much attention to get out of Bed. Patient continues to be unable to participate in a psych evaluation.  He is easily redirected. There continues to be notable delay in his responses, and he is observed with rapid eye blinking.  There is no documentation by nursing to discuss his behaviors over the weekend. From what I can ascertain his cognitive impairment and agitation began to worsen with administration of Ativan. He previously was responding well to this medication and helped to reduce his behaviors and decrease his agitation. He is observed to be out of his restraints at this time, and was happy to see Physical Therapy so he could get out of bed. As previously recommended we recommend safety sitter at bedside, and decrease use of restraints. Today he does have a Air cabin crew at bedside.  Today is day 10 of his admission, in which he presented to the ER with confusion and agitation. He was incidentally diagnosed with COVID-19, and admitted for acute metabolic encephalopathy. Work up thus far has been negative, with the exception of EEG 4/16 mild to moderate diffuse encephalopathy. Prior to this admission patient was fully functional, ambulating, walking his dogs, driving and completing all ADLs and IADLs independently.   Treatment Plan -Continue to recommend work up for tick -borne illnesses.  -Continue to Recommend 1:1 safety sitter as she remains confused, restless and agitated. Safety sitters offer supportive interaction with patient, and can easily identify patient needs, reorient, and address environmental factors that contribute to the delirium. If patient requires restraints, recommend a safety sitter also.  -Continue delirium precaution Continue to monitor and treat underlying medical causes of delirium, including infection, electrolyte disturbances, etc. -Patient  continues to become agitated and restless at times, which is expected. Prior to this admission he was very functional and mobile, it will be difficult to keep him confined to the bed.  - Delirium precautions           - Maintain hydration, oxygenation, nutrition           - Limit use of restraints and catheters           - Normalize sleep patterns by minimizing nighttime noise, light and interruptions by             clustering care, opening blinds during the day           - Reorient the patient frequently, provide easily visible clock and calendar           - Encourage ambulation (PT ordered), regular activities and visitors to maintain cognitive stimulation.            - Recommend treating medical problems and then discharging back to the facility.  -Continue recommendations with physical therapy, early mobilization does help with cognitive stimulation and improves delirium. 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 QTc 450 -Continue Depakote sprinkles 375mg  po BID. Depakote level ordered for tomorrow.  -Considering patient continues to have worsening cognitive impairment with no improvement and does not appear to be responding to traditional and standard therapies, will recommend inpatient Geriatric Psych facility.  -Recommend [facility] consult for inpatient Geropsych referral.

## 2020-06-21 NOTE — Progress Notes (Signed)
PROGRESS NOTE                                                                                                                                                                                                             Patient Demographics:    Patrick Phillips, is a 80 y.o. male, DOB - [birth year], ZDG:[id number]  Outpatient Primary MD for the patient is [name], MD   Admit date - 06/10/2020   LOS - 10  Chief Complaint  Patient presents with  . Altered Mental Status       Brief Narrative: Patient is a 80 y.o. male with PMHx of vascular dementia, prior CVA, DM-2, HTN-who presented with fever and confusion.  COVID-19 vaccinated status: Unknown  Significant Events: 4/14>> Admit to [facility] for fever/confusion  Significant studies: 4/14>>Chest x-ray: No PNA 4/14>> CT head: No acute intracranial abnormality. 4/14>> CT C-spine: No fracture 4/14>> MRI brain: No acute infarct. 4/15>> chest x-ray: No PNA 4/15>> CSF: WBC 1 (tube #4), protein 46, glucose 54 4/16>> EEG: Mild to moderate diffuse encephalopathy-no seizures.  COVID-19 medications: Remdesivir: 4/15>>  Antibiotics: Vancomycin: 4/14 x1 Ceftriaxone: 4/14 x 1 Ampicillin: 4/14 x 1  Microbiology data: 4/14 >>blood culture: No growth 4/14>> urine culture: No growth 4/15>> blood culture: No growth 4/15>> urine culture: Multiple species 4/15>> CSF culture: No growth 4/15>> CSF PCR HSV 1/2: Negative. 4/15>> CSF HSV culture: Negative 4/15>> CSF arbovirus IgG: Negative 4/15>> CSF VDRL: Negative RMSF CSF serology added  Procedures: 4/15>> lumbar puncture in the emergency room  Consults:  Neurology, psychiatry  DVT prophylaxis: enoxaparin (LOVENOX) injection 40 mg Start: 06/11/20 0600    Subjective:   Patient in recliner, appears comfortable, denies any headache, no fever, no chest pain or pressure, no shortness of breath , no abdominal pain. No new  focal weakness.    Assessment  & Plan :   Acute metabolic encephalopathy-superimposed on dementia with delirium: Encephalopathy on presentation was likely related to febrile illness/COVID-19 infection-all culture data negative so far.  CT head, MRI brain and EEG unremarkable. He has had no fever since admission-his encephalopathy that he had on admission has resolved-I suspect his confusion/agitation and now is related to delirium associated with dementia.    Evaluated by psychiatry and started on Saphris and Depakote-since he continues to have episodes of breakthrough delirium, note he was  extremely somnolent on 06/19/2020 after he received some Ativan, Ativan has been stopped,mentation much better on 06/21/20.  No focal deficits or headache.   ? Tick Bite: 2 ticks found last night by nursing staff and patient- will discuss with ID-if patient needs empiric IV Doxy coverage.RMSF added to CSF   AKI: Mild-likely hemodynamically mediated-resolved with IV fluids.  Borderline vitamin B12 deficiency: On supplementation.  Hypokalemia: Repleted-recheck periodically.  HTN: BP remains elevated-add low-dose Coreg-continue amlodipine.  Follow and optimize.  History of CVA-MRI brain negative for infarct-on aspirin/statin.  Dementia: On Namenda  GERD: On PPI  SIRS-COVID-19 infection: Completed Remdesivir x3 days.  Not hypoxic-no pneumonia-does not require any further therapy.  Interestingly-patient apparently had COVID-19 infection in February as well. Will finish his isolation 06/21/20 midnight.  Recent Labs    06/20/20 0110 06/21/20 0141  CRP <0.5 0.5    Lab Results  Component Value Date   SARSCOV2NAA POSITIVE (A) 06/11/2020   [place] NEGATIVE 01/13/2020   DM-2: CBGs relatively stable-continue SSI-oral hypoglycemic agents on hold  Recent Labs    06/20/20 1640 06/20/20 2111 06/21/20 0802  GLUCAP 115* 185* 149*       Condition - Extremely Guarded  Family Communication  :    Daughter Misty-[phone]-updated over the phone on 4/22   Daughter- Brenda-[phone] at bedside on 4/21, 06/19/20, 06/21/20   Code Status :  Full Code  Diet :  Diet Order            Diet regular Room service appropriate? Yes; Fluid consistency: Thin  Diet effective now                  Disposition Plan  :   Status is: Inpatient  Remains inpatient appropriate because:Inpatient level of care appropriate due to severity of illness   Dispo: The patient is from: Home              Anticipated d/c is to: Home              Patient currently is not medically stable to d/c.   Difficult to place patient No   Barriers to discharge: Resolving encephalopathy-awaiting final culture data.  Antimicorbials  :    Anti-infectives (From admission, onward)   Start     Dose/Rate Route Frequency Ordered Stop   06/18/20 1745  doxycycline (VIBRAMYCIN) 100 mg in sodium chloride 0.9 % 250 mL IVPB        100 mg 125 mL/hr over 120 Minutes Intravenous Every 12 hours 06/18/20 1648     06/12/20 1000  remdesivir 100 mg in sodium chloride 0.9 % 100 mL IVPB        100 mg 200 mL/hr over 30 Minutes Intravenous Daily 06/11/20 0435 06/13/20 0900   06/11/20 0445  remdesivir 200 mg in sodium chloride 0.9% 250 mL IVPB        200 mg 580 mL/hr over 30 Minutes Intravenous Once 06/11/20 0434 06/11/20 0929   06/11/20 0230  vancomycin (VANCOREADY) IVPB 1500 mg/300 mL        1,500 mg 150 mL/hr over 120 Minutes Intravenous  Once 06/11/20 0229 06/11/20 0812   06/11/20 0215  ampicillin (OMNIPEN) 2 g in sodium chloride 0.9 % 100 mL IVPB        2 g 300 mL/hr over 20 Minutes Intravenous  Once 06/11/20 0157 06/11/20 0550   06/11/20 0215  vancomycin (VANCOREADY) IVPB 1000 mg/200 mL  Status:  Discontinued        1,000 mg 200 mL/hr  over 60 Minutes Intravenous  Once 06/11/20 0157 06/11/20 0229   06/11/20 0200  cefTRIAXone (ROCEPHIN) 2 g in sodium chloride 0.9 % 100 mL IVPB        2 g 200 mL/hr over 30 Minutes  Intravenous  Once 06/11/20 0157 06/11/20 0248      Inpatient Medications  Scheduled Meds: . amLODipine  10 mg Oral Daily  . asenapine  5 mg Sublingual BID  . aspirin EC  81 mg Oral Daily  . atorvastatin  40 mg Oral Daily  . carvedilol  6.25 mg Oral BID WC  . divalproex  375 mg Oral Q12H  . enoxaparin (LOVENOX) injection  40 mg Subcutaneous Q24H  . feeding supplement  237 mL Oral BID BM  . insulin aspart  0-9 Units Subcutaneous TID WC  . melatonin  5 mg Oral QHS  . multivitamin with minerals  1 tablet Oral Daily  . pantoprazole  40 mg Oral Daily  . thiamine  100 mg Oral Daily  . vitamin B-12  1,000 mcg Oral Daily   Continuous Infusions: . doxycycline (VIBRAMYCIN) IV 100 mg (06/21/20 0907)   PRN Meds:.acetaminophen, haloperidol lactate, hydrALAZINE, [DISCONTINUED] ondansetron **OR** ondansetron (ZOFRAN) IV   Time Spent in minutes  25    See all Orders from today for further details   Lala Lund M.D on 06/21/2020 at 10:44 AM  To page go to [email or web address] - use universal password  [facility] -  Office  [phone]    Objective:   Vitals:   06/20/20 2336 06/21/20 0353 06/21/20 0500 06/21/20 0734  BP: (!) 171/76 (!) 183/85 125/60 (!) 173/77  Pulse: 67 68  75  Resp: 19 17 16 19   Temp: 98.4 F (36.9 C) 98.3 F (36.8 C)  98.5 F (36.9 C)  TempSrc: Oral Axillary  Axillary  SpO2: 96% 95%    Weight:      Height:        Wt Readings from Last 3 Encounters:  06/10/20 86.2 kg  06/10/20 86.2 kg  04/19/20 86.2 kg     Intake/Output Summary (Last 24 hours) at 06/21/2020 1044 Last data filed at 06/21/2020 0313 Gross per 24 hour  Intake 800.25 ml  Output --  Net 800.25 ml     Physical Exam  Awake and much more Alert with minimal confusion, No new F.N deficits, Normal affect [place].AT,PERRAL Supple Neck,No JVD, No cervical lymphadenopathy appriciated.  Symmetrical Chest wall movement, Good air movement bilaterally, CTAB RRR,No Gallops, Rubs or new Murmurs,  No Parasternal Heave +ve B.Sounds, Abd Soft, No tenderness, No organomegaly appriciated, No rebound - guarding or rigidity. No Cyanosis, Clubbing or edema, No new Rash or bruise     Data Review:    CBC Recent Labs  Lab 06/20/20 0110 06/21/20 0141  WBC 9.5 10.0  HGB 14.2 13.9  HCT 43.0 42.2  PLT 206 230  MCV 86.2 85.4  MCH 28.5 28.1  MCHC 33.0 32.9  RDW 13.5 13.6  LYMPHSABS 3.4 3.6  MONOABS 0.6 0.7  EOSABS 0.2 0.2  BASOSABS 0.0 0.0    Chemistries  Recent Labs  Lab 06/15/20 0204 06/18/20 0418 06/20/20 0110 06/21/20 0141  NA 138 137 138 140  K 3.4* 3.9 4.6 4.2  CL 105 103 106 108  CO2 27 25 24 23   GLUCOSE 145* 160* 185* 134*  BUN 19 18 17 19   CREATININE 1.04 0.97 1.02 1.11  CALCIUM 8.8* 9.1 9.3 9.3  MG 2.0  --  2.1 2.1  AST  --   --  71* 40  ALT  --   --  98* 74*  ALKPHOS  --   --  53 52  BILITOT  --   --  0.9 0.7   ------------------------------------------------------------------------------------------------------------------ No results for input(s): CHOL, HDL, LDLCALC, TRIG, CHOLHDL, LDLDIRECT in the last 72 hours.  Lab Results  Component Value Date   HGBA1C 6.6 (H) 06/11/2020   ------------------------------------------------------------------------------------------------------------------ No results for input(s): TSH, T4TOTAL, T3FREE, THYROIDAB in the last 72 hours.  Invalid input(s): FREET3 ------------------------------------------------------------------------------------------------------------------ No results for input(s): VITAMINB12, FOLATE, FERRITIN, TIBC, IRON, RETICCTPCT in the last 72 hours.  Coagulation profile No results for input(s): INR, PROTIME in the last 168 hours.  No results for input(s): DDIMER in the last 72 hours.  Cardiac Enzymes No results for input(s): CKMB, TROPONINI, MYOGLOBIN in the last 168 hours.  Invalid input(s):  CK ------------------------------------------------------------------------------------------------------------------    Component Value Date/Time   BNP 24.9 06/21/2020 0141    Micro Results No results found for this or any previous visit (from the past 240 hour(s)).  Radiology Reports CT Head Wo Contrast  Result Date: 06/10/2020 CLINICAL DATA:  Head and C-spine injury, fall, altered mental status EXAM: CT HEAD WITHOUT CONTRAST CT CERVICAL SPINE WITHOUT CONTRAST TECHNIQUE: Multidetector CT imaging of the head and cervical spine was performed following the standard protocol without intravenous contrast. Multiplanar CT image reconstructions of the cervical spine were also generated. COMPARISON:  05/04/2020 FINDINGS: CT HEAD FINDINGS Brain: Stable mild brain atrophy pattern. No acute intracranial hemorrhage, new mass lesion, acute infarction, midline shift, herniation, hydrocephalus, or extra-axial fluid collection. No focal mass effect or edema. Cisterns are patent. Cerebellar atrophy as well. Vascular: Intracranial atherosclerosis at the skull base. No hyperdense vessel. Skull: Normal. Negative for fracture or focal lesion. Sinuses/Orbits: Minor scattered sinus mucosal thickening as before. Orbits are symmetric. No acute finding. Other: None. CT CERVICAL SPINE FINDINGS Alignment: Normal. Skull base and vertebrae: No acute fracture. No primary bone lesion or focal pathologic process. Soft tissues and spinal canal: Normal prevertebral soft tissues. No hemorrhage or hematoma appreciated. Carotid atherosclerosis. Disc levels: Similar mild multilevel degenerative disc disease and facet arthropathy. Facets are aligned. No subluxation or dislocation. Preserved vertebral body heights. Upper chest: Negative. Other: None. IMPRESSION: Stable brain atrophy pattern. No acute intracranial abnormality or interval change by noncontrast CT. Stable mild cervical degenerative changes as above. No acute osseous finding or  malalignment by CT. Electronically Signed   By: Jerilynn Mages.  Shick M.D.   On: 06/10/2020 12:31   CT Cervical Spine Wo Contrast  Result Date: 06/10/2020 CLINICAL DATA:  Head and C-spine injury, fall, altered mental status EXAM: CT HEAD WITHOUT CONTRAST CT CERVICAL SPINE WITHOUT CONTRAST TECHNIQUE: Multidetector CT imaging of the head and cervical spine was performed following the standard protocol without intravenous contrast. Multiplanar CT image reconstructions of the cervical spine were also generated. COMPARISON:  05/04/2020 FINDINGS: CT HEAD FINDINGS Brain: Stable mild brain atrophy pattern. No acute intracranial hemorrhage, new mass lesion, acute infarction, midline shift, herniation, hydrocephalus, or extra-axial fluid collection. No focal mass effect or edema. Cisterns are patent. Cerebellar atrophy as well. Vascular: Intracranial atherosclerosis at the skull base. No hyperdense vessel. Skull: Normal. Negative for fracture or focal lesion. Sinuses/Orbits: Minor scattered sinus mucosal thickening as before. Orbits are symmetric. No acute finding. Other: None. CT CERVICAL SPINE FINDINGS Alignment: Normal. Skull base and vertebrae: No acute fracture. No primary bone lesion or focal pathologic process. Soft tissues and spinal canal: Normal prevertebral soft tissues. No hemorrhage or hematoma appreciated. Carotid atherosclerosis.  Disc levels: Similar mild multilevel degenerative disc disease and facet arthropathy. Facets are aligned. No subluxation or dislocation. Preserved vertebral body heights. Upper chest: Negative. Other: None. IMPRESSION: Stable brain atrophy pattern. No acute intracranial abnormality or interval change by noncontrast CT. Stable mild cervical degenerative changes as above. No acute osseous finding or malalignment by CT. Electronically Signed   By: Jerilynn Mages.  Shick M.D.   On: 06/10/2020 12:31   MR Brain Wo Contrast (neuro protocol)  Result Date: 06/10/2020 CLINICAL DATA:  Acute neuro deficit.  Mental  status change. EXAM: MRI HEAD WITHOUT CONTRAST TECHNIQUE: Multiplanar, multiecho pulse sequences of the brain and surrounding structures were obtained without intravenous contrast. COMPARISON:  CT head 06/10/2020 FINDINGS: Brain: Ventricle size and cerebral volume normal for age. Negative for acute infarct. No significant chronic ischemic change. Negative for hemorrhage or mass. Image quality degraded by mild motion. Vascular: Partial loss of flow void in the left internal carotid artery through the skull base and the distal left vertebral artery which may be due to slow flow or stenosis. Skull and upper cervical spine: Negative Sinuses/Orbits: Mild mucosal edema paranasal sinuses. No orbital mass. Bilateral exophthalmos. Other: None IMPRESSION: Negative for acute infarct. Partial loss of flow void in the left internal carotid artery distal left vertebral artery which could be due to proximal stenosis. Consider CT angio head and neck for further evaluation. Electronically Signed   By: Franchot Gallo M.D.   On: 06/10/2020 15:09   DG Chest Port 1 View  Result Date: 06/20/2020 CLINICAL DATA:  Shortness of breath EXAM: PORTABLE CHEST 1 VIEW COMPARISON:  06/11/2020 FINDINGS: Loop recorder is identified adjacent to the cardiac apex. Normal heart size. No pleural effusion or edema. No airspace densities identified. No acute osseous findings. IMPRESSION: No acute cardiopulmonary abnormalities. Electronically Signed   By: Kerby Moors M.D.   On: 06/20/2020 08:34   DG Chest Port 1 View  Result Date: 06/11/2020 CLINICAL DATA:  Questionable sepsis. EXAM: PORTABLE CHEST 1 VIEW COMPARISON:  June 10, 2020 FINDINGS: There is no evidence of acute infiltrate, pleural effusion or pneumothorax. The heart size and mediastinal contours are within normal limits. A radiopaque loop recorder device is seen. The visualized skeletal structures are unremarkable. IMPRESSION: No active disease. Electronically Signed   By: Virgina Norfolk M.D.   On: 06/11/2020 03:10   DG Chest Port 1 View  Result Date: 06/10/2020 CLINICAL DATA:  Altered mental status. EXAM: PORTABLE CHEST 1 VIEW COMPARISON:  May 25, 2020 FINDINGS: Loop recorder in place. Cardiomediastinal silhouette is normal. Mediastinal contours appear intact. Tortuosity and calcific atherosclerotic disease of the aorta. There is no evidence of focal airspace consolidation, pleural effusion or pneumothorax. Osseous structures are without acute abnormality. Soft tissues are grossly normal. IMPRESSION: No active disease. Electronically Signed   By: Fidela Salisbury M.D.   On: 06/10/2020 11:15   EEG adult  Result Date: 06/12/2020 Lora Havens, MD     06/12/2020  3:07 PM Patient Name: Montell Winget MRN: [id number] Epilepsy Attending: Lora Havens Referring Physician/Provider: Dr Oren Binet Date: 06/12/2020 Duration: 25.42 mins Patient history: 80yo M with ams. EEG to evaluate for seizure Level of alertness: Awake AEDs during EEG study: None Technical aspects: This EEG study was done with scalp electrodes positioned according to the 10-20 International system of electrode placement. Electrical activity was acquired at a sampling rate of 500Hz  and reviewed with a high frequency filter of 70Hz  and a low frequency filter of 1Hz . EEG data were recorded  continuously and digitally stored. Description: No posterior dominant rhythm was seen. EEG showed continuous generalized 5 to 6 Hz theta slowing. Hyperventilation and photic stimulation were not performed.   ABNORMALITY - Continuous slow, generalized IMPRESSION: This study is suggestive of mild to moderate diffuse encephalopathy, nonspecific etiology. No seizures or epileptiform discharges were seen throughout the recording. Priyanka Barbra Sarks   DG Hips Bilat W or Wo Pelvis 3-4 Views  Result Date: 06/10/2020 CLINICAL DATA:  Found on side of road, last seen normal at 2130 hours last night, fall, dementia, tenderness of hips  bilaterally EXAM: DG HIP (WITH OR WITHOUT PELVIS) 3-4V BILAT COMPARISON:  None FINDINGS: Osseous demineralization. Hip and SI joint spaces preserved. No acute fracture, dislocation, or bone destruction. Mild degenerative disc disease changes at visualized lower lumbar spine. IMPRESSION: No acute osseous abnormalities. Electronically Signed   By: Lavonia Dana M.D.   On: 06/10/2020 11:48

## 2020-06-21 NOTE — Progress Notes (Signed)
Physical Therapy Treatment Patient Details Name: Patrick Phillips MRN: [id number] DOB: November 07, 1940 Today's Date: 06/21/2020    History of Present Illness 80 y.o. male with presented with fever and confusion. Pt with dementia however still highly functional, took dog for a walk and dog came back without him. Pt later found lying on the side of road with no memory of what had happened but able to follow commands, taken to [facility] where MRI was negative for stroke and x-ray negative for fx. D/c home with daughter but pt with increasing confusion pt then brought  to [facility] ED 06/10/20. Pt with increasing confusion and agitation. Pt found to be febrile and COVID+. Admitted for acute metabolic encephalopathy.    PMHx of vascular dementia, prior CVA, DM-2, HTN    PT Comments    Today's skilled session continued to focus on mobility progression. Increased time needed to allow for maximal pt performance. Cues/redirection needed as pt is easily distracted. Acute PT to continue during pt's hospital stay.   Follow Up Recommendations  SNF;Other (comment) (vs memory care)     Equipment Recommendations  None recommended by PT    Precautions / Restrictions Precautions Precautions: Fall Precaution Comments: found down along side of road; h/o combativeness with dementia Restrictions Weight Bearing Restrictions: No    Mobility  Bed Mobility Overal bed mobility: Needs Assistance Bed Mobility: Supine to Sit;Sit to Supine     Supine to sit: Supervision;HOB elevated Sit to supine: Min guard;Min assist   General bed mobility comments: Incr time to get to EOB. Assist to bring legs back up into bed returning to supine    Transfers Overall transfer level: Needs assistance Equipment used: None Transfers: Sit to/from Stand Sit to Stand: Min guard         General transfer comment: for safety with standing  Ambulation/Gait Ambulation/Gait assistance: Min assist Gait Distance (Feet): 45 Feet Assistive  device: IV Pole Gait Pattern/deviations: Step-through pattern;Decreased step length - right;Decreased step length - left;Shuffle;Trunk flexed;Narrow base of support Gait velocity: decr   General Gait Details: Assist for balance. Verbal cues to widen base of support.      Cognition   Behavior During Therapy: Flat affect;Restless Overall Cognitive Status: Impaired/Different from baseline Area of Impairment: Orientation;Attention;Memory;Following commands;Safety/judgement;Awareness;Problem solving                 Orientation Level: Disoriented to;Time;Place;Situation Current Attention Level: Sustained Memory: Decreased short-term memory Following Commands: Follows one step commands consistently;Follows one step commands with increased time Safety/Judgement: Decreased awareness of deficits;Decreased awareness of safety   Problem Solving: Requires verbal cues;Requires tactile cues General Comments: followed one step commands consistently with increased time needed. easily distracted externally more so than internally.       Pertinent Vitals/Pain Pain Assessment: Faces Pain Score: 0-No pain Faces Pain Scale: No hurt     PT Goals (current goals can now be found in the care plan section) Acute Rehab PT Goals Patient Stated Goal: go home to [place] his dog PT Goal Formulation: With patient/family Time For Goal Achievement: 06/26/20 Potential to Achieve Goals: Good Progress towards PT goals: Progressing toward goals    Frequency    Min 3X/week      PT Plan Current plan remains appropriate    AM-PAC PT "6 Clicks" Mobility   Outcome Measure  Help needed turning from your back to your side while in a flat bed without using bedrails?: None Help needed moving from lying on your back to sitting on the side  of a flat bed without using bedrails?: None Help needed moving to and from a bed to a chair (including a wheelchair)?: A Little Help needed standing up from a chair using  your arms (e.g., wheelchair or bedside chair)?: A Little Help needed to walk in hospital room?: A Little Help needed climbing 3-5 steps with a railing? : A Little 6 Click Score: 20    End of Session Equipment Utilized During Treatment: Gait belt Activity Tolerance: Patient tolerated treatment well Patient left: in bed;with call bell/phone within reach;with bed alarm set;with nursing/sitter in room Nurse Communication: Mobility status PT Visit Diagnosis: Unsteadiness on feet (R26.81);Muscle weakness (generalized) (M62.81)     Time: 4709-6283 PT Time Calculation (min) (ACUTE ONLY): 22 min  Charges:  $Gait Training: 8-22 mins                    Willow Ora, PTA, [facility] Acute [employer] [phone] 06/21/20, 11:43 AM   Willow Ora 06/21/2020, 11:43 AM

## 2020-06-21 NOTE — Plan of Care (Signed)
  Problem: Safety: Goal: Non-violent Restraint(s) Outcome: Progressing   Problem: Education: Goal: Knowledge of General Education information will improve Description: Including pain rating scale, medication(s)/side effects and non-pharmacologic comfort measures Outcome: Progressing   Problem: Health Behavior/Discharge Planning: Goal: Ability to manage health-related needs will improve Outcome: Progressing   Problem: Clinical Measurements: Goal: Ability to maintain clinical measurements within normal limits will improve Outcome: Progressing Goal: Will remain free from infection Outcome: Progressing Goal: Diagnostic test results will improve Outcome: Progressing Goal: Respiratory complications will improve Outcome: Progressing Goal: Cardiovascular complication will be avoided Outcome: Progressing   Problem: Activity: Goal: Risk for activity intolerance will decrease Outcome: Progressing   Problem: Nutrition: Goal: Adequate nutrition will be maintained Outcome: Progressing   Problem: Coping: Goal: Level of anxiety will decrease Outcome: Progressing   Problem: Elimination: Goal: Will not experience complications related to bowel motility Outcome: Progressing Goal: Will not experience complications related to urinary retention Outcome: Progressing   Problem: Pain Managment: Goal: General experience of comfort will improve Outcome: Progressing   Problem: Safety: Goal: Ability to remain free from injury will improve Outcome: Progressing   Problem: Skin Integrity: Goal: Risk for impaired skin integrity will decrease Outcome: Progressing   Problem: Education: Goal: Knowledge of risk factors and measures for prevention of condition will improve Outcome: Progressing   Problem: Coping: Goal: Psychosocial and spiritual needs will be supported Outcome: Progressing   Problem: Respiratory: Goal: Will maintain a patent airway Outcome: Progressing Goal: Complications  related to the disease process, condition or treatment will be avoided or minimized Outcome: Progressing

## 2020-06-22 DIAGNOSIS — Z515 Encounter for palliative care: Secondary | ICD-10-CM | POA: Diagnosis not present

## 2020-06-22 LAB — CBC WITH DIFFERENTIAL/PLATELET
Abs Immature Granulocytes: 0.06 10*3/uL (ref 0.00–0.07)
Basophils Absolute: 0 10*3/uL (ref 0.0–0.1)
Basophils Relative: 0 %
Eosinophils Absolute: 0.2 10*3/uL (ref 0.0–0.5)
Eosinophils Relative: 1 %
HCT: 46.3 % (ref 39.0–52.0)
Hemoglobin: 15.1 g/dL (ref 13.0–17.0)
Immature Granulocytes: 1 %
Lymphocytes Relative: 26 %
Lymphs Abs: 3 10*3/uL (ref 0.7–4.0)
MCH: 28.1 pg (ref 26.0–34.0)
MCHC: 32.6 g/dL (ref 30.0–36.0)
MCV: 86.2 fL (ref 80.0–100.0)
Monocytes Absolute: 0.8 10*3/uL (ref 0.1–1.0)
Monocytes Relative: 7 %
Neutro Abs: 7.6 10*3/uL (ref 1.7–7.7)
Neutrophils Relative %: 65 %
Platelets: 268 10*3/uL (ref 150–400)
RBC: 5.37 MIL/uL (ref 4.22–5.81)
RDW: 13.9 % (ref 11.5–15.5)
WBC: 11.6 10*3/uL — ABNORMAL HIGH (ref 4.0–10.5)
nRBC: 0 % (ref 0.0–0.2)

## 2020-06-22 LAB — BRAIN NATRIURETIC PEPTIDE: B Natriuretic Peptide: 30.3 pg/mL (ref 0.0–100.0)

## 2020-06-22 LAB — MAGNESIUM: Magnesium: 2.2 mg/dL (ref 1.7–2.4)

## 2020-06-22 LAB — COMPREHENSIVE METABOLIC PANEL
ALT: 63 U/L — ABNORMAL HIGH (ref 0–44)
AST: 34 U/L (ref 15–41)
Albumin: 3.5 g/dL (ref 3.5–5.0)
Alkaline Phosphatase: 62 U/L (ref 38–126)
Anion gap: 10 (ref 5–15)
BUN: 23 mg/dL (ref 8–23)
CO2: 25 mmol/L (ref 22–32)
Calcium: 9.8 mg/dL (ref 8.9–10.3)
Chloride: 105 mmol/L (ref 98–111)
Creatinine, Ser: 1.08 mg/dL (ref 0.61–1.24)
GFR, Estimated: >60 mL/min (ref >60)
Glucose, Bld: 150 mg/dL — ABNORMAL HIGH (ref 70–99)
Potassium: 4.1 mmol/L (ref 3.5–5.1)
Sodium: 140 mmol/L (ref 135–145)
Total Bilirubin: 0.8 mg/dL (ref 0.3–1.2)
Total Protein: 6.3 g/dL — ABNORMAL LOW (ref 6.5–8.1)

## 2020-06-22 LAB — GLUCOSE, CAPILLARY
Glucose-Capillary: 165 mg/dL — ABNORMAL HIGH (ref 70–99)
Glucose-Capillary: 182 mg/dL — ABNORMAL HIGH (ref 70–99)
Glucose-Capillary: 126 mg/dL — ABNORMAL HIGH (ref 70–99)
Glucose-Capillary: 139 mg/dL — ABNORMAL HIGH (ref 70–99)

## 2020-06-22 LAB — ROCKY MTN SPOTTED FVR ABS PNL(IGG+IGM)
RMSF IgG: POSITIVE — AB
RMSF IgM: 0.4 {index} (ref 0.00–0.89)

## 2020-06-22 LAB — C-REACTIVE PROTEIN: CRP: 0.6 mg/dL (ref <1.0)

## 2020-06-22 LAB — VALPROIC ACID LEVEL: Valproic Acid Lvl: 39 ug/mL — ABNORMAL LOW (ref 50.0–100.0)

## 2020-06-22 LAB — RMSF, IGG, IFA: RMSF, IGG, IFA: <1:64 {titer}

## 2020-06-22 MED ORDER — DIVALPROEX SODIUM 125 MG PO CSDR
250.0000 mg | DELAYED_RELEASE_CAPSULE | Freq: Two times a day (BID) | ORAL | Status: DC
  Administered 2020-06-22: 250 mg via ORAL
  Filled 2020-06-22: qty 2

## 2020-06-22 NOTE — Progress Notes (Signed)
Lab technician stated that patient was not cooperating with lab draws at this time. RN informed lab technician to attempt to draw labs again at 0800 this morning.

## 2020-06-22 NOTE — TOC Progression Note (Signed)
Transition of Care [facility]) - Progression Note    Patient Details  Name: Patrick Phillips MRN: [id number] Date of Birth: [birth year]  Transition of Care [facility] CM/SW [place], [place] Phone Number: 06/22/2020, 2:51 PM  Clinical Narrative:     CSW called the following:  [facility] - reports they did not receive fax. CSW confirmed correct fax number is [phone]; will refax referral [place]([facility]) - Reports they have not reviewed referral yet' [place] - Left voicemail requesting return call [facility] hospital - Left voicemail requesting return call   Expected Discharge Plan: Memory Care Barriers to Discharge: Continued Medical Work up  Expected Discharge Plan and Services Expected Discharge Plan: Memory Care In-house Referral: Clinical Social Work Discharge Planning Services: CM Consult Post Acute Care Choice: Nursing Home Living arrangements for the past 2 months: Single Family Home                                       Social Determinants of Health (SDOH) Interventions    Readmission Risk Interventions No flowsheet data found.

## 2020-06-22 NOTE — Consult Note (Signed)
   Reason for Consult: ''severe delirium-please assist in med management.''  Patient observed sitting upright in bed, attempting to get up out of bed although he has bilateral restraints in place. Patient continues to be unable to participate in a psych evaluation.  There is no obvious nursing documentation present in his chart, to reflect his behaviors or ongoing use of restraints. There is not sitter at his bedside. Nursing staff reported there was no order for 1:1 safety sitter.   Treatment Plan -Continue to Recommend 1:1 safety sitter as she remains confused, restless and agitated. Safety sitters offer supportive interaction with patient, and can easily identify patient needs, reorient, and address environmental factors that contribute to the delirium. If patient requires restraints, recommend a safety sitter also.  -Continue delirium precaution Continue to monitor and treat underlying medical causes of delirium, including infection, electrolyte disturbances, etc. -Patient continues to become agitated and restless at times, which is expected. Prior to this admission he was very functional and mobile, it will be difficult to keep him confined to the bed.  - Delirium precautions           - Maintain hydration, oxygenation, nutrition           - Limit use of restraints and catheters           - Normalize sleep patterns by minimizing nighttime noise, light and interruptions by             clustering care, opening blinds during the day           - Reorient the patient frequently, provide easily visible clock and calendar           - Encourage ambulation (PT ordered), regular activities and visitors to maintain cognitive stimulation.            - Recommend treating medical problems and then discharging back to the facility.  -Continue recommendations with physical therapy, early mobilization does help with cognitive stimulation and improves delirium. 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 QTc 450 -Depakote level obtained today is not within normal range at this time (0.39), would recommend increasing dose to 500mg  po BID, however his primary team has decreased the dose Depakote 250mg  po BID.  -Considering patient continues to have worsening cognitive impairment with no improvement and does not appear to be responding to traditional and standard therapies, will recommend inpatient Geriatric Psych facility.  -Recommend [facility] consult for inpatient Geropsych referral.  -Psychiatry to sign off at this time. No additional recommendations at this time.

## 2020-06-22 NOTE — Progress Notes (Signed)
Occupational Therapy Treatment Patient Details Name: Patrick Phillips MRN: [id number] DOB: 02/07/1941 Today's Date: 06/22/2020    History of present illness 80 y.o. male with presented with fever and confusion. Pt with dementia however still highly functional, took dog for a walk and dog came back without him. Pt later found lying on the side of road with no memory of what had happened but able to follow commands, taken to [facility] where MRI was negative for stroke and x-ray negative for fx. D/c home with daughter but pt with increasing confusion pt then brought  to [facility] ED 06/10/20. Pt with increasing confusion and agitation. Pt found to be febrile and COVID+. Admitted for acute metabolic encephalopathy.    PMHx of vascular dementia, prior CVA, DM-2, HTN   OT comments  Pt limited by increased confusion this session oriented to self only and unaware that COTA had entered room until COTA standing directly in front of pt taping pts hands. Pt attempting to exit bed with bilateral restraints applied, doffed restraints with pt transitioning to EOB with MIN A for safety and sequencing. Pt attempting to stand from EOB with MAX A +1 but pt noted to be fixated on something on floor unable to elevate trunk and come into full stand. Would recommend +2 assist next session if confusion and decreased command following persists, may also need to update DC plans pending cognition, will follow acutely per POC.   Follow Up Recommendations  Home health OT;Supervision/Assistance - 24 hour;Other (comment) (SNF if 24/7 not available)    Equipment Recommendations  Tub/shower seat    Recommendations for Other Services      Precautions / Restrictions Precautions Precautions: Fall Precaution Comments: found down along side of road; h/o combativeness with dementia Restrictions Weight Bearing Restrictions: No       Mobility Bed Mobility Overal bed mobility: Needs Assistance Bed Mobility: Supine to Sit;Sit to Supine      Supine to sit: HOB elevated;Min assist Sit to supine: Max assist;HOB elevated   General bed mobility comments: pt attempting to sit EOB with BUEs restrained upon arrival, once restraints removed pt able to transition to EOB with MIN A for safety and sequencing as pt very impulsive, MAX A to assist pt back to supine as pt resistant to assist, +2 would be helpful next session if impulsivity and confusion persists    Transfers Overall transfer level: Needs assistance Equipment used: 1 person hand held assist Transfers: Sit to/from Stand Sit to Stand: Max assist         General transfer comment: pt able rise into mini squat with MAX A but unable to elevate trunk into standing ( pt seems to be fixated on something on floor) would benefit from +2 to assist with elevating trunk into upright position    Balance Overall balance assessment: Needs assistance Sitting-balance support: Feet supported;No upper extremity supported Sitting balance-Leahy Scale: Fair Sitting balance - Comments: close min guard for safety     Standing balance-Leahy Scale: Zero Standing balance comment: unable to come into full stand                           ADL either performed or assessed with clinical judgement   ADL Overall ADL's : Needs assistance/impaired                     Lower Body Dressing: Total assistance;Bed level Lower Body Dressing Details (indicate cue type and reason):  to don socks from bed level Toilet Transfer: Maximal assistance Toilet Transfer Details (indicate cue type and reason): sit<>stand only from EOB, unable to come into full stand as pt fixated on looking at floor unable to follow one step commands         Functional mobility during ADLs: Maximal assistance (sit<>stand) General ADL Comments: pt not following commands, oriented to self only     Vision       Perception     Praxis      Cognition Arousal/Alertness: Awake/alert Behavior During Therapy:  Anxious;Impulsive Overall Cognitive Status: History of cognitive impairments - at baseline Area of Impairment: Attention;Memory;Following commands;Safety/judgement;Awareness;Problem solving                 Orientation Level: Disoriented to;Time;Place;Situation Current Attention Level: Selective Memory: Decreased recall of precautions;Decreased short-term memory Following Commands: Follows one step commands inconsistently Safety/Judgement: Decreased awareness of deficits;Decreased awareness of safety Awareness: Intellectual Problem Solving: Requires verbal cues;Requires tactile cues;Difficulty sequencing;Slow processing General Comments: pt not following commands fixated on reaching down to floor, stating " I just need to go down to the house and get my guns" would really benefit from safety sitter        Exercises     Shoulder Instructions       General Comments restraints reapplied to pt at end of session, pt would benefit from 1:1 sitter, RN aware    Pertinent Vitals/ Pain       Pain Assessment: Faces Faces Pain Scale: Hurts a little bit Pain Location: general discomfort Pain Descriptors / Indicators: Discomfort;Grimacing Pain Intervention(s): Monitored during session;Repositioned  Home Living                                          Prior Functioning/Environment              Frequency  Min 2X/week        Progress Toward Goals  OT Goals(current goals can now be found in the care plan section)  Progress towards OT goals: Progressing toward goals  Acute Rehab OT Goals OT Goal Formulation: Patient unable to participate in goal setting Time For Goal Achievement: 06/28/20 Potential to Achieve Goals: Poor  Plan Discharge plan remains appropriate;Frequency remains appropriate    Co-evaluation                 AM-PAC OT "6 Clicks" Daily Activity     Outcome Measure   Help from another person eating meals?: None Help from another  person taking care of personal grooming?: A Lot Help from another person toileting, which includes using toliet, bedpan, or urinal?: Total Help from another person bathing (including washing, rinsing, drying)?: Total Help from another person to put on and taking off regular upper body clothing?: A Lot Help from another person to put on and taking off regular lower body clothing?: Total 6 Click Score: 11    End of Session Equipment Utilized During Treatment: Gait belt  OT Visit Diagnosis: Unsteadiness on feet (R26.81);Other abnormalities of gait and mobility (R26.89);History of falling (Z91.81);Muscle weakness (generalized) (M62.81);Other symptoms and signs involving cognitive function   Activity Tolerance Other (comment) (limited by confusion and decreased ability to follow commands)   Patient Left in bed;with call bell/phone within reach;with bed alarm set;with restraints reapplied   Nurse Communication Mobility status        Time: 1308-6578 OT Time  Calculation (min): 14 min  Charges: OT General Charges $OT Visit: 1 Visit OT Treatments $Therapeutic Activity: 8-22 mins  Harley Alto., COTA/L Acute Rehabilitation Services [phone] [place] 06/22/2020, 3:16 PM

## 2020-06-22 NOTE — Progress Notes (Signed)
RN attempted twice to initiate an IV for patient, without success. RN notified charge nurse of this. An IV team consult has been ordered for patient.

## 2020-06-22 NOTE — Progress Notes (Signed)
PROGRESS NOTE                                                                                                                                                                                                             Patient Demographics:    Patrick Phillips, is a 80 y.o. male, DOB - 1940/07/05, [id number]  Outpatient Primary MD for the patient is [name], MD   Admit date - 06/10/2020   LOS - 11  Chief Complaint  Patient presents with  . Altered Mental Status       Brief Narrative: Patient is a 80 y.o. male with PMHx of vascular dementia, prior CVA, DM-2, HTN-who presented with fever and confusion.  COVID-19 vaccinated status: Unknown  Significant Events: 4/14>> Admit to [facility] for fever/confusion  Significant studies: 4/14>>Chest x-ray: No PNA 4/14>> CT head: No acute intracranial abnormality. 4/14>> CT C-spine: No fracture 4/14>> MRI brain: No acute infarct. 4/15>> chest x-ray: No PNA 4/15>> CSF: WBC 1 (tube #4), protein 46, glucose 54 4/16>> EEG: Mild to moderate diffuse encephalopathy-no seizures.  COVID-19 medications: Remdesivir: 4/15>>  Antibiotics: Vancomycin: 4/14 x1 Ceftriaxone: 4/14 x 1 Ampicillin: 4/14 x 1  Microbiology data: 4/14 >>blood culture: No growth 4/14>> urine culture: No growth 4/15>> blood culture: No growth 4/15>> urine culture: Multiple species 4/15>> CSF culture: No growth 4/15>> CSF PCR HSV 1/2: Negative. 4/15>> CSF HSV culture: Negative 4/15>> CSF arbovirus IgG: Negative 4/15>> CSF VDRL: Negative RMSF CSF serology added  Procedures: 4/15>> lumbar puncture in the emergency room  Consults:  Neurology, psychiatry  DVT prophylaxis: enoxaparin (LOVENOX) injection 40 mg Start: 06/11/20 0600    Subjective:   Patient in bed mildly confused and somnolent, denies any headache chest or abdominal pain.   Assessment  & Plan :   Acute metabolic  encephalopathy-superimposed on dementia with delirium: Encephalopathy on presentation was likely related to febrile illness/COVID-19 infection-all culture data negative so far.  CT head, MRI brain and EEG unremarkable. He has had no fever since admission-his encephalopathy that he had on admission has resolved-I suspect his confusion/agitation and now is related to delirium associated with dementia.    Evaluated by psychiatry and started on Saphris and Depakote-since he continues to have episodes of breakthrough delirium, note he was extremely somnolent on 06/19/2020 after he received some Ativan, Ativan has been stopped, mentation  has improved but overall still drowsy, will drop Depakote dose to 250 twice daily on 06/22/2020 and monitor.   ? Tick Bite: 2 ticks found last night by nursing staff and patient- will discuss with ID-if patient needs empiric IV Doxy coverage.RMSF added to CSF, still pending.   AKI: Mild-likely hemodynamically mediated-resolved with IV fluids.  Borderline vitamin B12 deficiency: On supplementation.  Hypokalemia: Repleted-recheck periodically.  HTN: BP remains elevated-add low-dose Coreg-continue amlodipine.  Follow and optimize.  History of CVA-MRI brain negative for infarct-on aspirin/statin.  Dementia: On Namenda  GERD: On PPI  SIRS-COVID-19 infection: Completed Remdesivir x3 days.  Not hypoxic-no pneumonia-does not require any further therapy.  Interestingly-patient apparently had COVID-19 infection in February as well.  Has finished his COVID isolation on 06/21/2020  Recent Labs    06/20/20 0110 06/21/20 0141 06/22/20 0739  CRP <0.5 0.5 0.6    Lab Results  Component Value Date   SARSCOV2NAA POSITIVE (A) 06/11/2020   Silt NEGATIVE 01/13/2020   DM-2: CBGs relatively stable-continue SSI-oral hypoglycemic agents on hold  Recent Labs    06/21/20 1703 06/21/20 2045 06/22/20 0825  GLUCAP 142* 159* 165*       Condition - Extremely  Guarded  Family Communication  :   Daughter Misty-[phone]-updated over the phone on 4/22   Daughter- Brenda-[phone] at bedside on 4/21, 06/19/20, 06/21/20   Code Status :  Full Code  Diet :  Diet Order            Diet regular Room service appropriate? No; Fluid consistency: Thin  Diet effective now                  Disposition Plan  :   Status is: Inpatient  Remains inpatient appropriate because:Inpatient level of care appropriate due to severity of illness   Dispo: The patient is from: Home              Anticipated d/c is to: Home              Patient currently is not medically stable to d/c.   Difficult to place patient No   Barriers to discharge: Resolving encephalopathy-awaiting final culture data.  Antimicorbials  :    Anti-infectives (From admission, onward)   Start     Dose/Rate Route Frequency Ordered Stop   06/18/20 1745  doxycycline (VIBRAMYCIN) 100 mg in sodium chloride 0.9 % 250 mL IVPB        100 mg 125 mL/hr over 120 Minutes Intravenous Every 12 hours 06/18/20 1648     06/12/20 1000  remdesivir 100 mg in sodium chloride 0.9 % 100 mL IVPB        100 mg 200 mL/hr over 30 Minutes Intravenous Daily 06/11/20 0435 06/13/20 0900   06/11/20 0445  remdesivir 200 mg in sodium chloride 0.9% 250 mL IVPB        200 mg 580 mL/hr over 30 Minutes Intravenous Once 06/11/20 0434 06/11/20 0929   06/11/20 0230  vancomycin (VANCOREADY) IVPB 1500 mg/300 mL        1,500 mg 150 mL/hr over 120 Minutes Intravenous  Once 06/11/20 0229 06/11/20 0812   06/11/20 0215  ampicillin (OMNIPEN) 2 g in sodium chloride 0.9 % 100 mL IVPB        2 g 300 mL/hr over 20 Minutes Intravenous  Once 06/11/20 0157 06/11/20 0550   06/11/20 0215  vancomycin (VANCOREADY) IVPB 1000 mg/200 mL  Status:  Discontinued        1,000 mg  200 mL/hr over 60 Minutes Intravenous  Once 06/11/20 0157 06/11/20 0229   06/11/20 0200  cefTRIAXone (ROCEPHIN) 2 g in sodium chloride 0.9 % 100 mL IVPB        2  g 200 mL/hr over 30 Minutes Intravenous  Once 06/11/20 0157 06/11/20 0248      Inpatient Medications  Scheduled Meds: . amLODipine  10 mg Oral Daily  . asenapine  5 mg Sublingual BID  . aspirin EC  81 mg Oral Daily  . atorvastatin  40 mg Oral Daily  . carvedilol  6.25 mg Oral BID WC  . divalproex  250 mg Oral Q12H  . enoxaparin (LOVENOX) injection  40 mg Subcutaneous Q24H  . feeding supplement  237 mL Oral BID BM  . insulin aspart  0-9 Units Subcutaneous TID WC  . melatonin  5 mg Oral QHS  . multivitamin with minerals  1 tablet Oral Daily  . pantoprazole  40 mg Oral Daily  . thiamine  100 mg Oral Daily  . vitamin B-12  1,000 mcg Oral Daily   Continuous Infusions: . doxycycline (VIBRAMYCIN) IV 100 mg (06/22/20 0815)   PRN Meds:.acetaminophen, haloperidol lactate, hydrALAZINE, [DISCONTINUED] ondansetron **OR** ondansetron (ZOFRAN) IV   Time Spent in minutes  25    See all Orders from today for further details   Lala Lund M.D on 06/22/2020 at 10:25 AM  To page go to [email or web address] - use universal password  [facility] -  Office  [phone]    Objective:   Vitals:   06/22/20 0538 06/22/20 0632 06/22/20 0806 06/22/20 0820  BP: (!) 156/121 (!) 150/61 (!) 160/70 (!) 144/73  Pulse: 78 73 79 80  Resp: 20 20  19   Temp: 97.9 F (36.6 C)   98.7 F (37.1 C)  TempSrc: Oral   Axillary  SpO2: 98%   99%  Weight:      Height:        Wt Readings from Last 3 Encounters:  06/10/20 86.2 kg  06/10/20 86.2 kg  04/19/20 86.2 kg     Intake/Output Summary (Last 24 hours) at 06/22/2020 1025 Last data filed at 06/22/2020 1010 Gross per 24 hour  Intake 1040 ml  Output --  Net 1040 ml     Physical Exam  Awake but pleasantly confused and appears somnolent, able to follow basic commands no focal deficits Bernie.AT,PERRAL Supple Neck,No JVD, No cervical lymphadenopathy appriciated.  Symmetrical Chest wall movement, Good air movement bilaterally, CTAB RRR,No Gallops,  Rubs or new Murmurs, No Parasternal Heave +ve B.Sounds, Abd Soft, No tenderness, No organomegaly appriciated, No rebound - guarding or rigidity. No Cyanosis, Clubbing or edema, No new Rash or bruise    Data Review:    CBC Recent Labs  Lab 06/20/20 0110 06/21/20 0141 06/22/20 0739  WBC 9.5 10.0 11.6*  HGB 14.2 13.9 15.1  HCT 43.0 42.2 46.3  PLT 206 230 268  MCV 86.2 85.4 86.2  MCH 28.5 28.1 28.1  MCHC 33.0 32.9 32.6  RDW 13.5 13.6 13.9  LYMPHSABS 3.4 3.6 3.0  MONOABS 0.6 0.7 0.8  EOSABS 0.2 0.2 0.2  BASOSABS 0.0 0.0 0.0    Chemistries  Recent Labs  Lab 06/18/20 0418 06/20/20 0110 06/21/20 0141 06/22/20 0739  NA 137 138 140 140  K 3.9 4.6 4.2 4.1  CL 103 106 108 105  CO2 25 24 23 25   GLUCOSE 160* 185* 134* 150*  BUN 18 17 19 23   CREATININE 0.97 1.02 1.11 1.08  CALCIUM 9.1  9.3 9.3 9.8  MG  --  2.1 2.1 2.2  AST  --  71* 40 34  ALT  --  98* 74* 63*  ALKPHOS  --  53 52 62  BILITOT  --  0.9 0.7 0.8   ------------------------------------------------------------------------------------------------------------------ No results for input(s): CHOL, HDL, LDLCALC, TRIG, CHOLHDL, LDLDIRECT in the last 72 hours.  Lab Results  Component Value Date   HGBA1C 6.6 (H) 06/11/2020   ------------------------------------------------------------------------------------------------------------------ No results for input(s): TSH, T4TOTAL, T3FREE, THYROIDAB in the last 72 hours.  Invalid input(s): FREET3 ------------------------------------------------------------------------------------------------------------------ No results for input(s): VITAMINB12, FOLATE, FERRITIN, TIBC, IRON, RETICCTPCT in the last 72 hours.  Coagulation profile No results for input(s): INR, PROTIME in the last 168 hours.  No results for input(s): DDIMER in the last 72 hours.  Cardiac Enzymes No results for input(s): CKMB, TROPONINI, MYOGLOBIN in the last 168 hours.  Invalid input(s):  CK ------------------------------------------------------------------------------------------------------------------    Component Value Date/Time   BNP 30.3 06/22/2020 0739    Micro Results No results found for this or any previous visit (from the past 240 hour(s)).  Radiology Reports CT Head Wo Contrast  Result Date: 06/10/2020 CLINICAL DATA:  Head and C-spine injury, fall, altered mental status EXAM: CT HEAD WITHOUT CONTRAST CT CERVICAL SPINE WITHOUT CONTRAST TECHNIQUE: Multidetector CT imaging of the head and cervical spine was performed following the standard protocol without intravenous contrast. Multiplanar CT image reconstructions of the cervical spine were also generated. COMPARISON:  05/04/2020 FINDINGS: CT HEAD FINDINGS Brain: Stable mild brain atrophy pattern. No acute intracranial hemorrhage, new mass lesion, acute infarction, midline shift, herniation, hydrocephalus, or extra-axial fluid collection. No focal mass effect or edema. Cisterns are patent. Cerebellar atrophy as well. Vascular: Intracranial atherosclerosis at the skull base. No hyperdense vessel. Skull: Normal. Negative for fracture or focal lesion. Sinuses/Orbits: Minor scattered sinus mucosal thickening as before. Orbits are symmetric. No acute finding. Other: None. CT CERVICAL SPINE FINDINGS Alignment: Normal. Skull base and vertebrae: No acute fracture. No primary bone lesion or focal pathologic process. Soft tissues and spinal canal: Normal prevertebral soft tissues. No hemorrhage or hematoma appreciated. Carotid atherosclerosis. Disc levels: Similar mild multilevel degenerative disc disease and facet arthropathy. Facets are aligned. No subluxation or dislocation. Preserved vertebral body heights. Upper chest: Negative. Other: None. IMPRESSION: Stable brain atrophy pattern. No acute intracranial abnormality or interval change by noncontrast CT. Stable mild cervical degenerative changes as above. No acute osseous finding or  malalignment by CT. Electronically Signed   By: Jerilynn Mages.  Shick M.D.   On: 06/10/2020 12:31   CT Cervical Spine Wo Contrast  Result Date: 06/10/2020 CLINICAL DATA:  Head and C-spine injury, fall, altered mental status EXAM: CT HEAD WITHOUT CONTRAST CT CERVICAL SPINE WITHOUT CONTRAST TECHNIQUE: Multidetector CT imaging of the head and cervical spine was performed following the standard protocol without intravenous contrast. Multiplanar CT image reconstructions of the cervical spine were also generated. COMPARISON:  05/04/2020 FINDINGS: CT HEAD FINDINGS Brain: Stable mild brain atrophy pattern. No acute intracranial hemorrhage, new mass lesion, acute infarction, midline shift, herniation, hydrocephalus, or extra-axial fluid collection. No focal mass effect or edema. Cisterns are patent. Cerebellar atrophy as well. Vascular: Intracranial atherosclerosis at the skull base. No hyperdense vessel. Skull: Normal. Negative for fracture or focal lesion. Sinuses/Orbits: Minor scattered sinus mucosal thickening as before. Orbits are symmetric. No acute finding. Other: None. CT CERVICAL SPINE FINDINGS Alignment: Normal. Skull base and vertebrae: No acute fracture. No primary bone lesion or focal pathologic process. Soft tissues and spinal canal:  Normal prevertebral soft tissues. No hemorrhage or hematoma appreciated. Carotid atherosclerosis. Disc levels: Similar mild multilevel degenerative disc disease and facet arthropathy. Facets are aligned. No subluxation or dislocation. Preserved vertebral body heights. Upper chest: Negative. Other: None. IMPRESSION: Stable brain atrophy pattern. No acute intracranial abnormality or interval change by noncontrast CT. Stable mild cervical degenerative changes as above. No acute osseous finding or malalignment by CT. Electronically Signed   By: Jerilynn Mages.  Shick M.D.   On: 06/10/2020 12:31   MR Brain Wo Contrast (neuro protocol)  Result Date: 06/10/2020 CLINICAL DATA:  Acute neuro deficit.  Mental  status change. EXAM: MRI HEAD WITHOUT CONTRAST TECHNIQUE: Multiplanar, multiecho pulse sequences of the brain and surrounding structures were obtained without intravenous contrast. COMPARISON:  CT head 06/10/2020 FINDINGS: Brain: Ventricle size and cerebral volume normal for age. Negative for acute infarct. No significant chronic ischemic change. Negative for hemorrhage or mass. Image quality degraded by mild motion. Vascular: Partial loss of flow void in the left internal carotid artery through the skull base and the distal left vertebral artery which may be due to slow flow or stenosis. Skull and upper cervical spine: Negative Sinuses/Orbits: Mild mucosal edema paranasal sinuses. No orbital mass. Bilateral exophthalmos. Other: None IMPRESSION: Negative for acute infarct. Partial loss of flow void in the left internal carotid artery distal left vertebral artery which could be due to proximal stenosis. Consider CT angio head and neck for further evaluation. Electronically Signed   By: Franchot Gallo M.D.   On: 06/10/2020 15:09   DG Chest Port 1 View  Result Date: 06/20/2020 CLINICAL DATA:  Shortness of breath EXAM: PORTABLE CHEST 1 VIEW COMPARISON:  06/11/2020 FINDINGS: Loop recorder is identified adjacent to the cardiac apex. Normal heart size. No pleural effusion or edema. No airspace densities identified. No acute osseous findings. IMPRESSION: No acute cardiopulmonary abnormalities. Electronically Signed   By: Kerby Moors M.D.   On: 06/20/2020 08:34   DG Chest Port 1 View  Result Date: 06/11/2020 CLINICAL DATA:  Questionable sepsis. EXAM: PORTABLE CHEST 1 VIEW COMPARISON:  June 10, 2020 FINDINGS: There is no evidence of acute infiltrate, pleural effusion or pneumothorax. The heart size and mediastinal contours are within normal limits. A radiopaque loop recorder device is seen. The visualized skeletal structures are unremarkable. IMPRESSION: No active disease. Electronically Signed   By: Virgina Norfolk M.D.   On: 06/11/2020 03:10   DG Chest Port 1 View  Result Date: 06/10/2020 CLINICAL DATA:  Altered mental status. EXAM: PORTABLE CHEST 1 VIEW COMPARISON:  May 25, 2020 FINDINGS: Loop recorder in place. Cardiomediastinal silhouette is normal. Mediastinal contours appear intact. Tortuosity and calcific atherosclerotic disease of the aorta. There is no evidence of focal airspace consolidation, pleural effusion or pneumothorax. Osseous structures are without acute abnormality. Soft tissues are grossly normal. IMPRESSION: No active disease. Electronically Signed   By: Fidela Salisbury M.D.   On: 06/10/2020 11:15   EEG adult  Result Date: 06/12/2020 Lora Havens, MD     06/12/2020  3:07 PM Patient Name: Jacori Nagarajan MRN: [id number] Epilepsy Attending: Lora Havens Referring Physician/Provider: Dr Oren Binet Date: 06/12/2020 Duration: 25.42 mins Patient history: 80yo M with ams. EEG to evaluate for seizure Level of alertness: Awake AEDs during EEG study: None Technical aspects: This EEG study was done with scalp electrodes positioned according to the 10-20 International system of electrode placement. Electrical activity was acquired at a sampling rate of 500Hz  and reviewed with a high frequency filter of 70Hz   and a low frequency filter of 1Hz . EEG data were recorded continuously and digitally stored. Description: No posterior dominant rhythm was seen. EEG showed continuous generalized 5 to 6 Hz theta slowing. Hyperventilation and photic stimulation were not performed.   ABNORMALITY - Continuous slow, generalized IMPRESSION: This study is suggestive of mild to moderate diffuse encephalopathy, nonspecific etiology. No seizures or epileptiform discharges were seen throughout the recording. Priyanka Barbra Sarks   DG Hips Bilat W or Wo Pelvis 3-4 Views  Result Date: 06/10/2020 CLINICAL DATA:  Found on side of road, last seen normal at 2130 hours last night, fall, dementia, tenderness of hips  bilaterally EXAM: DG HIP (WITH OR WITHOUT PELVIS) 3-4V BILAT COMPARISON:  None FINDINGS: Osseous demineralization. Hip and SI joint spaces preserved. No acute fracture, dislocation, or bone destruction. Mild degenerative disc disease changes at visualized lower lumbar spine. IMPRESSION: No acute osseous abnormalities. Electronically Signed   By: Lavonia Dana M.D.   On: 06/10/2020 11:48

## 2020-06-23 DIAGNOSIS — Z515 Encounter for palliative care: Secondary | ICD-10-CM | POA: Diagnosis not present

## 2020-06-23 LAB — COMPREHENSIVE METABOLIC PANEL
ALT: 54 U/L — ABNORMAL HIGH (ref 0–44)
AST: 32 U/L (ref 15–41)
Albumin: 3.2 g/dL — ABNORMAL LOW (ref 3.5–5.0)
Alkaline Phosphatase: 58 U/L (ref 38–126)
Anion gap: 12 (ref 5–15)
BUN: 25 mg/dL — ABNORMAL HIGH (ref 8–23)
CO2: 23 mmol/L (ref 22–32)
Calcium: 9.6 mg/dL (ref 8.9–10.3)
Chloride: 105 mmol/L (ref 98–111)
Creatinine, Ser: 1.12 mg/dL (ref 0.61–1.24)
GFR, Estimated: >60 mL/min (ref >60)
Glucose, Bld: 132 mg/dL — ABNORMAL HIGH (ref 70–99)
Potassium: 3.7 mmol/L (ref 3.5–5.1)
Sodium: 140 mmol/L (ref 135–145)
Total Bilirubin: 1.2 mg/dL (ref 0.3–1.2)
Total Protein: 6 g/dL — ABNORMAL LOW (ref 6.5–8.1)

## 2020-06-23 LAB — CBC
HCT: 40.5 % (ref 39.0–52.0)
Hemoglobin: 13.2 g/dL (ref 13.0–17.0)
MCH: 28.2 pg (ref 26.0–34.0)
MCHC: 32.6 g/dL (ref 30.0–36.0)
MCV: 86.5 fL (ref 80.0–100.0)
Platelets: 238 10*3/uL (ref 150–400)
RBC: 4.68 MIL/uL (ref 4.22–5.81)
RDW: 14.1 % (ref 11.5–15.5)
WBC: 10.2 10*3/uL (ref 4.0–10.5)
nRBC: 0 % (ref 0.0–0.2)

## 2020-06-23 LAB — GLUCOSE, CAPILLARY
Glucose-Capillary: 164 mg/dL — ABNORMAL HIGH (ref 70–99)
Glucose-Capillary: 167 mg/dL — ABNORMAL HIGH (ref 70–99)
Glucose-Capillary: 203 mg/dL — ABNORMAL HIGH (ref 70–99)
Glucose-Capillary: 196 mg/dL — ABNORMAL HIGH (ref 70–99)

## 2020-06-23 LAB — C-REACTIVE PROTEIN: CRP: 0.6 mg/dL (ref <1.0)

## 2020-06-23 LAB — MAGNESIUM: Magnesium: 2.2 mg/dL (ref 1.7–2.4)

## 2020-06-23 LAB — BRAIN NATRIURETIC PEPTIDE: B Natriuretic Peptide: 21 pg/mL (ref 0.0–100.0)

## 2020-06-23 MED ORDER — HYDRALAZINE HCL 20 MG/ML IJ SOLN: 5.0000 mg | Freq: Four times a day (QID) | INTRAMUSCULAR | Status: DC | PRN

## 2020-06-23 MED ORDER — TAMSULOSIN HCL 0.4 MG PO CAPS
0.4000 mg | ORAL_CAPSULE | Freq: Every day | ORAL | Status: DC
  Administered 2020-06-23 – 2020-07-14 (×22): 0.4 mg via ORAL
  Filled 2020-06-23 (×22): qty 1

## 2020-06-23 MED ORDER — LACTATED RINGERS IV SOLN: INTRAVENOUS | Status: DC

## 2020-06-23 MED ORDER — QUETIAPINE FUMARATE 25 MG PO TABS
25.0000 mg | ORAL_TABLET | Freq: Two times a day (BID) | ORAL | Status: DC | PRN
  Administered 2020-06-23 – 2020-06-24 (×3): 25 mg via ORAL
  Filled 2020-06-23 (×3): qty 1

## 2020-06-23 MED ORDER — QUETIAPINE FUMARATE 25 MG PO TABS: 25.0000 mg | ORAL_TABLET | Freq: Two times a day (BID) | ORAL | Status: DC

## 2020-06-23 MED ORDER — DIVALPROEX SODIUM 125 MG PO CSDR
125.0000 mg | DELAYED_RELEASE_CAPSULE | Freq: Two times a day (BID) | ORAL | Status: DC
  Administered 2020-06-23 – 2020-07-04 (×22): 125 mg via ORAL
  Filled 2020-06-23 (×23): qty 1

## 2020-06-23 NOTE — Progress Notes (Addendum)
Overnight:   Pt noted to have a wound that was previously charted on his L elbow.  R elbow noted to be reddened but blanchable.  Applied a mepilex to both arms.  MD paged for wound consult to assess injury.    Attempted to turn patient, but pt is unwilling to stay on his side.

## 2020-06-23 NOTE — Consult Note (Signed)
   Reason for Consult: ''severe delirium-please assist in med management.''  Patient daily psych evaluations continued to be limited by his confusion and worsening cognitive impairment. Patient is unable to participate today as he appears to be obtunded. Patient has not presented in such manner throughout this admission. There continues to be concerns about excessive sedation and drowsiness as it relates to his psychiatric medications. Will adjust medications that are potentially leading to his sedation. There continues to lack nursing documentation in his chart, to reflect his behaviors.    Treatment Plan -Continue to Recommend 1:1 safety sitter as she remains confused, restless and agitated. Safety sitters offer supportive interaction with patient, and can easily identify patient needs, reorient, and address environmental factors that contribute to the delirium. If patient requires restraints, recommend a safety sitter also.  -Continue delirium precaution Continue to monitor and treat underlying medical causes of delirium, including infection, electrolyte disturbances, etc.  - Delirium precautions           - Maintain hydration, oxygenation, nutrition           - Limit use of restraints and catheters           - Normalize sleep patterns by minimizing nighttime noise, light and interruptions by             clustering care, opening blinds during the day           - Reorient the patient frequently, provide easily visible clock and calendar           - Encourage ambulation (PT ordered), regular activities and visitors to maintain cognitive stimulation.            - Recommend treating medical problems and then discharging back to the facility.  -Continue recommendations with physical therapy, early mobilization does help with cognitive stimulation and improves delirium. -Please continue to seek other causes of his worsening cognitive impairment, as it may not be solely related to his psychotropic  medications.  -DC  Saphris 5 mg. Will resume Seroquel 25mg  po BID prn for dementia with behavioral disturbances, and psychosis. Will not schedule this medication at this time as he appears to be obtunded and difficult to arouse.  Last EKG ordered on 04/14 QTc of 450, will repeat EKG at this time.  -Depakote level (0.39 on 04/26), Dose adjustments made by primary team due to concerns about sedation. Depakote sprinkles at 125mg  po BID.   Recommend geropsych inpatient once medically stable.  -Recommend TOC consult for inpatient Geropsych referral.

## 2020-06-23 NOTE — Progress Notes (Addendum)
PROGRESS NOTE                                                                                                                                                                                                             Patient Demographics:    Patrick Phillips, is a 80 y.o. male, DOB - 05/12/1940, [id number]  Outpatient Primary MD for the patient is [name], MD   Admit date - 06/10/2020   LOS - 12  Chief Complaint  Patient presents with  . Altered Mental Status       Brief Narrative: Patient is a 80 y.o. male with PMHx of vascular dementia, prior CVA, DM-2, HTN-who presented with fever and confusion.  COVID-19 vaccinated status: Unknown  Significant Events: 4/14>> Admit to [facility] for fever/confusion  Significant studies: 4/14>>Chest x-ray: No PNA 4/14>> CT head: No acute intracranial abnormality. 4/14>> CT C-spine: No fracture 4/14>> MRI brain: No acute infarct. 4/15>> chest x-ray: No PNA 4/15>> CSF: WBC 1 (tube #4), protein 46, glucose 54 4/16>> EEG: Mild to moderate diffuse encephalopathy-no seizures.  COVID-19 medications: Remdesivir: 4/15>>  Antibiotics: Vancomycin: 4/14 x1 Ceftriaxone: 4/14 x 1 Ampicillin: 4/14 x 1  Microbiology data: 4/14 >>blood culture: No growth 4/14>> urine culture: No growth 4/15>> blood culture: No growth 4/15>> urine culture: Multiple species 4/15>> CSF culture: No growth 4/15>> CSF PCR HSV 1/2: Negative. 4/15>> CSF HSV culture: Negative 4/15>> CSF arbovirus IgG: Negative 4/15>> CSF VDRL: Negative RMSF CSF serology IgM -ve  Procedures: 4/15>> lumbar puncture in the emergency room  Consults:  Neurology, psychiatry  DVT prophylaxis: enoxaparin (LOVENOX) injection 40 mg Start: 06/11/20 0600    Subjective:   Patient in bed mildly confused and somnolent, denies any headache chest or abdominal pain.   Assessment  & Plan :   Acute metabolic  encephalopathy-superimposed on dementia with delirium: Encephalopathy on presentation was likely related to febrile illness/COVID-19 infection-all culture data negative so far.  CT head, MRI brain and EEG unremarkable. He has had no fever since admission-his encephalopathy that he had on admission has resolved-I suspect his confusion/agitation and now is related to delirium associated with dementia.  Evaluated by psychiatry and started on Saphris and Depakote-since he continues to have episodes of breakthrough delirium, note he was extremely somnolent on 06/19/2020 after he received some Ativan, Ativan has been stopped, mentation has improved but overall still drowsy, on Saphris 5 twice daily along with Depakote, dose dropped again on 06/23/2020, will request psych to reevaluate psych medications and follow on a daily basis.   ? Tick Bite: 2 ticks found by nursing staff on patient 06/17/20 - RMSF IgM -ve, DW ID Dr Tommy Medal 06/23/20 - not acute, no Rx.   AKI: Mild-likely hemodynamically mediated-resolved with IV fluids.  Borderline vitamin B12 deficiency: On supplementation.  Hypokalemia: Repleted-recheck periodically.  HTN: BP remains elevated-add low-dose Coreg-continue amlodipine.  Follow and optimize.  History of CVA-MRI brain negative for infarct-on aspirin/statin.  Dementia: On Namenda  GERD: On PPI  SIRS-COVID-19 infection: Completed Remdesivir x3 days.  Not hypoxic-no pneumonia-does not require any further therapy.  Interestingly-patient apparently had COVID-19 infection in February as well.  Has finished his COVID isolation on 06/21/2020  Recent Labs    06/21/20 0141 06/22/20 0739 06/23/20 0420  CRP 0.5 0.6 0.6    Lab Results  Component Value Date   SARSCOV2NAA POSITIVE (A) 06/11/2020   [place] NEGATIVE 01/13/2020   DM-2: CBGs relatively stable-continue SSI-oral hypoglycemic agents on hold  Recent Labs    06/22/20 1222 06/22/20 1645 06/22/20 2025  GLUCAP 182* 126*  139*     Condition - Extremely Guarded  Family Communication  :   Daughter Misty-[phone]-updated over the phone on 4/22   Daughter- Brenda-[phone] at bedside on 4/21, 06/19/20, 06/21/20, 06/23/20   Code Status :  Full Code  Diet :  Diet Order            Diet regular Room service appropriate? No; Fluid consistency: Thin  Diet effective now                  Disposition Plan  :   Status is: Inpatient  Remains inpatient appropriate because:Inpatient level of care appropriate due to severity of illness   Dispo: The patient is from: Home              Anticipated d/c is to: Home              Patient currently is not medically stable to d/c.   Difficult to place patient No   Barriers to discharge: Resolving encephalopathy-awaiting final culture data.  Antimicorbials  :    Anti-infectives (From admission, onward)   Start     Dose/Rate Route Frequency Ordered Stop   06/18/20 1745  doxycycline (VIBRAMYCIN) 100 mg in sodium chloride 0.9 % 250 mL IVPB        100 mg 125 mL/hr over 120 Minutes Intravenous Every 12 hours 06/18/20 1648     06/12/20 1000  remdesivir 100 mg in sodium chloride 0.9 % 100 mL IVPB        100 mg 200 mL/hr over 30 Minutes Intravenous Daily 06/11/20 0435 06/13/20 0900   06/11/20 0445  remdesivir 200 mg in sodium chloride 0.9% 250 mL IVPB        200 mg 580 mL/hr over 30 Minutes Intravenous Once 06/11/20 0434 06/11/20 0929   06/11/20 0230  vancomycin (VANCOREADY) IVPB 1500 mg/300 mL        1,500 mg 150 mL/hr over 120 Minutes Intravenous  Once 06/11/20 0229 06/11/20 0812   06/11/20 0215  ampicillin (OMNIPEN) 2 g in sodium chloride 0.9 % 100 mL  IVPB        2 g 300 mL/hr over 20 Minutes Intravenous  Once 06/11/20 0157 06/11/20 0550   06/11/20 0215  vancomycin (VANCOREADY) IVPB 1000 mg/200 mL  Status:  Discontinued        1,000 mg 200 mL/hr over 60 Minutes Intravenous  Once 06/11/20 0157 06/11/20 0229   06/11/20 0200  cefTRIAXone (ROCEPHIN) 2 g in  sodium chloride 0.9 % 100 mL IVPB        2 g 200 mL/hr over 30 Minutes Intravenous  Once 06/11/20 0157 06/11/20 0248      Inpatient Medications  Scheduled Meds: . amLODipine  10 mg Oral Daily  . asenapine  5 mg Sublingual BID  . aspirin EC  81 mg Oral Daily  . atorvastatin  40 mg Oral Daily  . carvedilol  6.25 mg Oral BID WC  . divalproex  125 mg Oral Q12H  . enoxaparin (LOVENOX) injection  40 mg Subcutaneous Q24H  . feeding supplement  237 mL Oral BID BM  . insulin aspart  0-9 Units Subcutaneous TID WC  . melatonin  5 mg Oral QHS  . multivitamin with minerals  1 tablet Oral Daily  . pantoprazole  40 mg Oral Daily  . tamsulosin  0.4 mg Oral Daily  . thiamine  100 mg Oral Daily  . vitamin B-12  1,000 mcg Oral Daily   Continuous Infusions: . doxycycline (VIBRAMYCIN) IV 100 mg (06/22/20 2245)   PRN Meds:.acetaminophen, haloperidol lactate, hydrALAZINE, [DISCONTINUED] ondansetron **OR** ondansetron (ZOFRAN) IV   Time Spent in minutes  25    See all Orders from today for further details   Lala Lund M.D on 06/23/2020 at 8:39 AM  To page go to [email or web address] - use universal password  [facility] -  Office  [phone]    Objective:   Vitals:   06/22/20 2020 06/23/20 0011 06/23/20 0417 06/23/20 0813  BP: 140/79 125/64 (!) 141/62   Pulse: 69 75 70   Resp: 18 18 18 17   Temp: 98.2 F (36.8 C) 98.1 F (36.7 C) 98.2 F (36.8 C) 98.1 F (36.7 C)  TempSrc: Axillary Axillary Axillary Axillary  SpO2: 100% 100% 100%   Weight:      Height:        Wt Readings from Last 3 Encounters:  06/10/20 86.2 kg  06/10/20 86.2 kg  04/19/20 86.2 kg     Intake/Output Summary (Last 24 hours) at 06/23/2020 0839 Last data filed at 06/22/2020 1647 Gross per 24 hour  Intake 520 ml  Output --  Net 520 ml     Physical Exam  Somnolent and confused , able to follow basic commands no focal deficits Awake Alert, No new F.N deficits, Normal affect [place].AT,PERRAL Supple  Neck,No JVD, No cervical lymphadenopathy appriciated.  Symmetrical Chest wall movement, Good air movement bilaterally, CTAB RRR,No Gallops, Rubs or new Murmurs, No Parasternal Heave +ve B.Sounds, Abd Soft, No tenderness, No organomegaly appriciated, No rebound - guarding or rigidity. No Cyanosis, Clubbing or edema, No new Rash or bruise    Data Review:    CBC Recent Labs  Lab 06/20/20 0110 06/21/20 0141 06/22/20 0739 06/23/20 0608  WBC 9.5 10.0 11.6* 10.2  HGB 14.2 13.9 15.1 13.2  HCT 43.0 42.2 46.3 40.5  PLT 206 230 268 238  MCV 86.2 85.4 86.2 86.5  MCH 28.5 28.1 28.1 28.2  MCHC 33.0 32.9 32.6 32.6  RDW 13.5 13.6 13.9 14.1  LYMPHSABS 3.4 3.6 3.0  --   MONOABS  0.6 0.7 0.8  --   EOSABS 0.2 0.2 0.2  --   BASOSABS 0.0 0.0 0.0  --     Chemistries  Recent Labs  Lab 06/18/20 0418 06/20/20 0110 06/21/20 0141 06/22/20 0739 06/23/20 0420  NA 137 138 140 140 140  K 3.9 4.6 4.2 4.1 3.7  CL 103 106 108 105 105  CO2 25 24 23 25 23   GLUCOSE 160* 185* 134* 150* 132*  BUN 18 17 19 23  25*  CREATININE 0.97 1.02 1.11 1.08 1.12  CALCIUM 9.1 9.3 9.3 9.8 9.6  MG  --  2.1 2.1 2.2 2.2  AST  --  71* 40 34 32  ALT  --  98* 74* 63* 54*  ALKPHOS  --  53 52 62 58  BILITOT  --  0.9 0.7 0.8 1.2   ------------------------------------------------------------------------------------------------------------------ No results for input(s): CHOL, HDL, LDLCALC, TRIG, CHOLHDL, LDLDIRECT in the last 72 hours.  Lab Results  Component Value Date   HGBA1C 6.6 (H) 06/11/2020   ------------------------------------------------------------------------------------------------------------------ No results for input(s): TSH, T4TOTAL, T3FREE, THYROIDAB in the last 72 hours.  Invalid input(s): FREET3 ------------------------------------------------------------------------------------------------------------------ No results for input(s): VITAMINB12, FOLATE, FERRITIN, TIBC, IRON, RETICCTPCT in the last 72  hours.  Coagulation profile No results for input(s): INR, PROTIME in the last 168 hours.  No results for input(s): DDIMER in the last 72 hours.  Cardiac Enzymes No results for input(s): CKMB, TROPONINI, MYOGLOBIN in the last 168 hours.  Invalid input(s): CK ------------------------------------------------------------------------------------------------------------------    Component Value Date/Time   BNP 21.0 06/23/2020 0608    Micro Results No results found for this or any previous visit (from the past 240 hour(s)).  Radiology Reports CT Head Wo Contrast  Result Date: 06/10/2020 CLINICAL DATA:  Head and C-spine injury, fall, altered mental status EXAM: CT HEAD WITHOUT CONTRAST CT CERVICAL SPINE WITHOUT CONTRAST TECHNIQUE: Multidetector CT imaging of the head and cervical spine was performed following the standard protocol without intravenous contrast. Multiplanar CT image reconstructions of the cervical spine were also generated. COMPARISON:  05/04/2020 FINDINGS: CT HEAD FINDINGS Brain: Stable mild brain atrophy pattern. No acute intracranial hemorrhage, new mass lesion, acute infarction, midline shift, herniation, hydrocephalus, or extra-axial fluid collection. No focal mass effect or edema. Cisterns are patent. Cerebellar atrophy as well. Vascular: Intracranial atherosclerosis at the skull base. No hyperdense vessel. Skull: Normal. Negative for fracture or focal lesion. Sinuses/Orbits: Minor scattered sinus mucosal thickening as before. Orbits are symmetric. No acute finding. Other: None. CT CERVICAL SPINE FINDINGS Alignment: Normal. Skull base and vertebrae: No acute fracture. No primary bone lesion or focal pathologic process. Soft tissues and spinal canal: Normal prevertebral soft tissues. No hemorrhage or hematoma appreciated. Carotid atherosclerosis. Disc levels: Similar mild multilevel degenerative disc disease and facet arthropathy. Facets are aligned. No subluxation or dislocation.  Preserved vertebral body heights. Upper chest: Negative. Other: None. IMPRESSION: Stable brain atrophy pattern. No acute intracranial abnormality or interval change by noncontrast CT. Stable mild cervical degenerative changes as above. No acute osseous finding or malalignment by CT. Electronically Signed   By: Jerilynn Mages.  Shick M.D.   On: 06/10/2020 12:31   CT Cervical Spine Wo Contrast  Result Date: 06/10/2020 CLINICAL DATA:  Head and C-spine injury, fall, altered mental status EXAM: CT HEAD WITHOUT CONTRAST CT CERVICAL SPINE WITHOUT CONTRAST TECHNIQUE: Multidetector CT imaging of the head and cervical spine was performed following the standard protocol without intravenous contrast. Multiplanar CT image reconstructions of the cervical spine were also generated. COMPARISON:  05/04/2020 FINDINGS:  CT HEAD FINDINGS Brain: Stable mild brain atrophy pattern. No acute intracranial hemorrhage, new mass lesion, acute infarction, midline shift, herniation, hydrocephalus, or extra-axial fluid collection. No focal mass effect or edema. Cisterns are patent. Cerebellar atrophy as well. Vascular: Intracranial atherosclerosis at the skull base. No hyperdense vessel. Skull: Normal. Negative for fracture or focal lesion. Sinuses/Orbits: Minor scattered sinus mucosal thickening as before. Orbits are symmetric. No acute finding. Other: None. CT CERVICAL SPINE FINDINGS Alignment: Normal. Skull base and vertebrae: No acute fracture. No primary bone lesion or focal pathologic process. Soft tissues and spinal canal: Normal prevertebral soft tissues. No hemorrhage or hematoma appreciated. Carotid atherosclerosis. Disc levels: Similar mild multilevel degenerative disc disease and facet arthropathy. Facets are aligned. No subluxation or dislocation. Preserved vertebral body heights. Upper chest: Negative. Other: None. IMPRESSION: Stable brain atrophy pattern. No acute intracranial abnormality or interval change by noncontrast CT. Stable mild  cervical degenerative changes as above. No acute osseous finding or malalignment by CT. Electronically Signed   By: Judie PetitM.  Shick M.D.   On: 06/10/2020 12:31   MR Brain Wo Contrast (neuro protocol)  Result Date: 06/10/2020 CLINICAL DATA:  Acute neuro deficit.  Mental status change. EXAM: MRI HEAD WITHOUT CONTRAST TECHNIQUE: Multiplanar, multiecho pulse sequences of the brain and surrounding structures were obtained without intravenous contrast. COMPARISON:  CT head 06/10/2020 FINDINGS: Brain: Ventricle size and cerebral volume normal for age. Negative for acute infarct. No significant chronic ischemic change. Negative for hemorrhage or mass. Image quality degraded by mild motion. Vascular: Partial loss of flow void in the left internal carotid artery through the skull base and the distal left vertebral artery which may be due to slow flow or stenosis. Skull and upper cervical spine: Negative Sinuses/Orbits: Mild mucosal edema paranasal sinuses. No orbital mass. Bilateral exophthalmos. Other: None IMPRESSION: Negative for acute infarct. Partial loss of flow void in the left internal carotid artery distal left vertebral artery which could be due to proximal stenosis. Consider CT angio head and neck for further evaluation. Electronically Signed   By: Marlan Palauharles  Clark M.D.   On: 06/10/2020 15:09   DG Chest Port 1 View  Result Date: 06/20/2020 CLINICAL DATA:  Shortness of breath EXAM: PORTABLE CHEST 1 VIEW COMPARISON:  06/11/2020 FINDINGS: Loop recorder is identified adjacent to the cardiac apex. Normal heart size. No pleural effusion or edema. No airspace densities identified. No acute osseous findings. IMPRESSION: No acute cardiopulmonary abnormalities. Electronically Signed   By: Signa Kellaylor  Stroud M.D.   On: 06/20/2020 08:34   DG Chest Port 1 View  Result Date: 06/11/2020 CLINICAL DATA:  Questionable sepsis. EXAM: PORTABLE CHEST 1 VIEW COMPARISON:  June 10, 2020 FINDINGS: There is no evidence of acute infiltrate,  pleural effusion or pneumothorax. The heart size and mediastinal contours are within normal limits. A radiopaque loop recorder device is seen. The visualized skeletal structures are unremarkable. IMPRESSION: No active disease. Electronically Signed   By: Aram Candelahaddeus  Houston M.D.   On: 06/11/2020 03:10   DG Chest Port 1 View  Result Date: 06/10/2020 CLINICAL DATA:  Altered mental status. EXAM: PORTABLE CHEST 1 VIEW COMPARISON:  May 25, 2020 FINDINGS: Loop recorder in place. Cardiomediastinal silhouette is normal. Mediastinal contours appear intact. Tortuosity and calcific atherosclerotic disease of the aorta. There is no evidence of focal airspace consolidation, pleural effusion or pneumothorax. Osseous structures are without acute abnormality. Soft tissues are grossly normal. IMPRESSION: No active disease. Electronically Signed   By: Ted Mcalpineobrinka  Dimitrova M.D.   On: 06/10/2020 11:15  EEG adult  Result Date: 06/12/2020 Lora Havens, MD     06/12/2020  3:07 PM Patient Name: Jayvyn Haselton MRN: [id number] Epilepsy Attending: Lora Havens Referring Physician/Provider: Dr Oren Binet Date: 06/12/2020 Duration: 25.42 mins Patient history: 80yo M with ams. EEG to evaluate for seizure Level of alertness: Awake AEDs during EEG study: None Technical aspects: This EEG study was done with scalp electrodes positioned according to the 10-20 International system of electrode placement. Electrical activity was acquired at a sampling rate of 500Hz  and reviewed with a high frequency filter of 70Hz  and a low frequency filter of 1Hz . EEG data were recorded continuously and digitally stored. Description: No posterior dominant rhythm was seen. EEG showed continuous generalized 5 to 6 Hz theta slowing. Hyperventilation and photic stimulation were not performed.   ABNORMALITY - Continuous slow, generalized IMPRESSION: This study is suggestive of mild to moderate diffuse encephalopathy, nonspecific etiology. No seizures or  epileptiform discharges were seen throughout the recording. Priyanka Barbra Sarks   DG Hips Bilat W or Wo Pelvis 3-4 Views  Result Date: 06/10/2020 CLINICAL DATA:  Found on side of road, last seen normal at 2130 hours last night, fall, dementia, tenderness of hips bilaterally EXAM: DG HIP (WITH OR WITHOUT PELVIS) 3-4V BILAT COMPARISON:  None FINDINGS: Osseous demineralization. Hip and SI joint spaces preserved. No acute fracture, dislocation, or bone destruction. Mild degenerative disc disease changes at visualized lower lumbar spine. IMPRESSION: No acute osseous abnormalities. Electronically Signed   By: Lavonia Dana M.D.   On: 06/10/2020 11:48

## 2020-06-23 NOTE — Consult Note (Signed)
WOC Nurse Consult Note: Patient receiving care in [facility] [phone]. Reason for Consult: left elbow wound Wound type: abrasion Pressure Injury POA: Yes/No/NA Measurement: na Wound bed: yellow Drainage (amount, consistency, odor) none Periwound: intact Dressing procedure/placement/frequency: Apply iodine from the swabsticks or swab pads from clean utility to left elbow abrasion.  Cover with small foam dressing. Foam dressing can be used up to 3 days. [place] nurse will not follow at this time.  Please re-consult the [facility] team if needed.  Val Riles, RN, MSN, CWOCN, CNS-BC, pager [phone]

## 2020-06-23 NOTE — TOC Progression Note (Signed)
Transition of Care [facility]) - Progression Note    Patient Details  Name: Patrick Phillips MRN: [id number] Date of Birth: 11-29-1940  Transition of Care [facility]) CM/SW [place], [place] Phone Number: 06/23/2020, 2:23 PM  Clinical Narrative:     CSW re faxed referrals to the following Geropsych facilities: [facility], and [facility] hospital.   Expected Discharge Plan: Memory Care Barriers to Discharge: Continued Medical Work up  Expected Discharge Plan and Services Expected Discharge Plan: Memory Care In-house Referral: Clinical Social Work Discharge Planning Services: CM Consult Post Acute Care Choice: Nursing Home Living arrangements for the past 2 months: Single Family Home                                       Social Determinants of Health (SDOH) Interventions    Readmission Risk Interventions No flowsheet data found.

## 2020-06-24 ENCOUNTER — Inpatient Hospital Stay (HOSPITAL_COMMUNITY): Payer: Medicare Other

## 2020-06-24 DIAGNOSIS — Z515 Encounter for palliative care: Secondary | ICD-10-CM | POA: Diagnosis not present

## 2020-06-24 DIAGNOSIS — G9341 Metabolic encephalopathy: Secondary | ICD-10-CM | POA: Diagnosis not present

## 2020-06-24 LAB — BASIC METABOLIC PANEL
Anion gap: 8 (ref 5–15)
BUN: 23 mg/dL (ref 8–23)
CO2: 25 mmol/L (ref 22–32)
Calcium: 9.1 mg/dL (ref 8.9–10.3)
Chloride: 105 mmol/L (ref 98–111)
Creatinine, Ser: 1.1 mg/dL (ref 0.61–1.24)
GFR, Estimated: >60 mL/min (ref >60)
Glucose, Bld: 178 mg/dL — ABNORMAL HIGH (ref 70–99)
Potassium: 4 mmol/L (ref 3.5–5.1)
Sodium: 138 mmol/L (ref 135–145)

## 2020-06-24 LAB — GLUCOSE, CAPILLARY
Glucose-Capillary: 171 mg/dL — ABNORMAL HIGH (ref 70–99)
Glucose-Capillary: 157 mg/dL — ABNORMAL HIGH (ref 70–99)
Glucose-Capillary: 119 mg/dL — ABNORMAL HIGH (ref 70–99)

## 2020-06-24 LAB — MAGNESIUM: Magnesium: 2.1 mg/dL (ref 1.7–2.4)

## 2020-06-24 MED ORDER — HYDRALAZINE HCL 50 MG PO TABS
50.0000 mg | ORAL_TABLET | Freq: Three times a day (TID) | ORAL | Status: DC
  Administered 2020-06-24 – 2020-07-11 (×44): 50 mg via ORAL
  Filled 2020-06-24 (×46): qty 1

## 2020-06-24 NOTE — Progress Notes (Signed)
SLP Cancellation Note  Patient Details Name: Patrick Phillips MRN: [id number] DOB: [birth year]   Cancelled treatment:       Reason Eval/Treat Not Completed: Other (comment) (Transport arrived to take pt for MBS, but Denyse Amass, Therapist, sports indicated that the pt had become uncooperative and agitated aroung 1100, required Seroquel and was asleep when transport arrived. The MBS will be reattempted on 4/29 in the morning pending Radiology's schedule.)  Diann Bangerter I. Hardin Negus, [place], [place] Office number [phone] Pager [place] 06/24/2020, 1:51 PM

## 2020-06-24 NOTE — Progress Notes (Signed)
Nutrition Follow-up  DOCUMENTATION CODES:   Not applicable  INTERVENTION:   Continue supplements:  Ensure Enlive po BID, each supplement provides 350 kcal and 20 grams of protein  Magic cup BID with meals, each supplement provides 290 kcal and 9 grams of protein  MVI with minerals daily  NUTRITION DIAGNOSIS:   Increased nutrient needs related to acute illness (COVID) as evidenced by estimated needs.  Ongoing  GOAL:   Patient will meet greater than or equal to 90% of their needs   Progressing  MONITOR:   PO intake,Labs,Weight trends,I & O's  REASON FOR ASSESSMENT:   Malnutrition Screening Tool    ASSESSMENT:   Pt admitted with acute metabolic encephalopathy, likely 2/2 febrile illness/COVID-19 infection. PMH includes HTN, DM, Ca, dementia.  Patient with worsening cognitive impairment, ongoing disorientation and confusion. 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 Meal intakes: 0-50% He has been refusing Ensure Enlive/Plus supplements. Receiving magic cups with meals BID. Labs and medications reviewed. CBG's: 171-157 Psychiatry recommended inpatient geropsych referral.   Diet Order:   Diet Order            Diet regular Room service appropriate? No; Fluid consistency: Thin  Diet effective now                 EDUCATION NEEDS:   No education needs have been identified at this time  Skin:  Skin Assessment: Reviewed RN Assessment  Last BM:  4/24 type 2  Height:   Ht Readings from Last 1 Encounters:  06/10/20 6' (1.829 m)    Weight:   Wt Readings from Last 1 Encounters:  06/10/20 86.2 kg   BMI:  Body mass index is 25.77 kg/m.  Estimated Nutritional Needs:   Kcal:  1950-2150  Protein:  100-110 grams  Fluid:  >1.95L/d   Lucas Mallow, RD, LDN, CNSC Please refer to Amion for contact information.

## 2020-06-24 NOTE — Progress Notes (Signed)
Occupational Therapy Treatment Patient Details Name: Patrick Phillips MRN: [id number] DOB: 1940-10-17 Today's Date: 06/24/2020    History of present illness 80 y.o. male with presented with fever and confusion. Pt with dementia however still highly functional, took dog for a walk and dog came back without him. Pt later found lying on the side of road with no memory of what had happened but able to follow commands, taken to [facility] where MRI was negative for stroke and x-ray negative for fx. D/c home with daughter but pt with increasing confusion pt then brought  to [facility] ED 06/10/20. Pt with increasing confusion and agitation. Pt found to be febrile and COVID+. Admitted for acute metabolic encephalopathy.    PMHx of vascular dementia, prior CVA, DM-2, HTN   OT comments  Pt received in bed, A&Ox1. Pt with constant, tangential speech throughout session. Pt with poor attention to tasks and unable to successfully redirect requiring increased assist for all ADL tasks today. Pt overall Mod A for sit to stand at bedside, +2 for safety in mobility to sink though pt perseverating on reaching bottom of IV pole (actually pulling IV pole off of ground). Unable to successfully complete ADL tasks at sink despite various redirection attempts, so assisted pt back to bed with soft restraints applied. Noted 24/7 family support not available and worsening cognition, so DC recs updated to reflect.    Follow Up Recommendations  SNF;Supervision/Assistance - 24 hour;Other (comment) (vs memory care)    Equipment Recommendations  None recommended by OT    Recommendations for Other Services      Precautions / Restrictions Precautions Precautions: Fall Precaution Comments: found down along side of road; h/o combativeness with dementia Restrictions Weight Bearing Restrictions: No       Mobility Bed Mobility Overal bed mobility: Needs Assistance Bed Mobility: Supine to Sit;Sit to Supine     Supine to sit:  Supervision;HOB elevated Sit to supine: Max assist;HOB elevated   General bed mobility comments: Able to get to EOB, but due to attempting to get back out of bed, requires increased assist to return to supine for restraint application    Transfers Overall transfer level: Needs assistance Equipment used: 2 person hand held assist;1 person hand held assist Transfers: Sit to/from Stand Sit to Stand: Mod assist;+2 safety/equipment         General transfer comment: Mod A for sit to stand, poor maintainence of balance/attention to tasks increasing fall risk    Balance Overall balance assessment: Needs assistance Sitting-balance support: Feet supported;No upper extremity supported Sitting balance-Leahy Scale: Fair     Standing balance support: Single extremity supported;No upper extremity supported;During functional activity Standing balance-Leahy Scale: Poor Standing balance comment: requires outside assist to maintain balance                           ADL either performed or assessed with clinical judgement   ADL Overall ADL's : Needs assistance/impaired     Grooming: Moderate assistance;Standing;Sitting Grooming Details (indicate cue type and reason): able to wash face with max cues to initiate, poor attention. unable to get pt to brush teeth despite multimodal cues             Lower Body Dressing: Maximal assistance;Sit to/from stand;Sitting/lateral leans Lower Body Dressing Details (indicate cue type and reason): Attempted to don around feet but could not, OT assisted in placing over toes, but pt then pulled socks off. unable to redirect  Functional mobility during ADLs: Moderate assistance;Cueing for safety;Cueing for sequencing;+2 for physical assistance;+2 for safety/equipment General ADL Comments: Very poor attention and inability to follow commands consistently today     Vision   Vision Assessment?: No apparent visual deficits    Perception     Praxis      Cognition Arousal/Alertness: Awake/alert Behavior During Therapy: Impulsive;Restless Overall Cognitive Status: History of cognitive impairments - at baseline Area of Impairment: Attention;Memory;Following commands;Safety/judgement;Awareness;Problem solving;Orientation                 Orientation Level: Disoriented to;Time;Place;Situation Current Attention Level: Focused Memory: Decreased recall of precautions;Decreased short-term memory Following Commands: Follows one step commands inconsistently Safety/Judgement: Decreased awareness of deficits;Decreased awareness of safety Awareness: Intellectual Problem Solving: Requires verbal cues;Requires tactile cues;Difficulty sequencing;Slow processing General Comments: decreased command follow. when asked if he knows where he is, reports "at your house, i reckon). Perseverating on lines and bottom of IV pole (actually attempting to lift it off of ground). unable to redirect for any tasks.        Exercises     Shoulder Instructions       General Comments retraints reapplied. Pt attempting to push over tray table after OT exited - RN/OT in to assist/redirection attempts    Pertinent Vitals/ Pain       Pain Assessment: Faces Faces Pain Scale: No hurt Pain Intervention(s): Monitored during session  Home Living                                          Prior Functioning/Environment              Frequency  Min 2X/week        Progress Toward Goals  OT Goals(current goals can now be found in the care plan section)  Progress towards OT goals: Not progressing toward goals - comment  Acute Rehab OT Goals Patient Stated Goal: go home to [place] his dog OT Goal Formulation: Patient unable to participate in goal setting Time For Goal Achievement: 06/28/20 Potential to Achieve Goals: Poor ADL Goals Pt Will Perform Grooming: with supervision;with set-up;standing;with caregiver  independent in assisting Pt Will Perform Upper Body Bathing: with supervision;with set-up;sitting;with caregiver independent in assisting Pt Will Perform Lower Body Bathing: with min assist;with min guard assist;with caregiver independent in assisting;sit to/from stand Pt Will Perform Upper Body Dressing: with supervision;with set-up;sitting;with caregiver independent in assisting Pt Will Perform Lower Body Dressing: with min assist;with min guard assist;sit to/from stand;with caregiver independent in assisting Pt Will Transfer to Toilet: with supervision;ambulating Pt Will Perform Toileting - Clothing Manipulation and hygiene: with min guard assist;with supervision;with caregiver independent in assisting;sit to/from stand Pt Will Perform Tub/Shower Transfer: with min guard assist;with supervision;ambulating;with caregiver independent in assisting;shower seat  Plan Discharge plan needs to be updated    Co-evaluation                 AM-PAC OT "6 Clicks" Daily Activity     Outcome Measure   Help from another person eating meals?: A Little Help from another person taking care of personal grooming?: A Lot Help from another person toileting, which includes using toliet, bedpan, or urinal?: Total Help from another person bathing (including washing, rinsing, drying)?: A Lot Help from another person to put on and taking off regular upper body clothing?: A Lot Help from another person to put on and taking  off regular lower body clothing?: A Lot 6 Click Score: 12    End of Session Equipment Utilized During Treatment: Gait belt  OT Visit Diagnosis: Unsteadiness on feet (R26.81);Other abnormalities of gait and mobility (R26.89);History of falling (Z91.81);Muscle weakness (generalized) (M62.81);Other symptoms and signs involving cognitive function   Activity Tolerance Other (comment) (limited by cognition)   Patient Left in bed;with call bell/phone within reach;with bed alarm set;with  restraints reapplied   Nurse Communication Mobility status        Time: 1002-1030 OT Time Calculation (min): 28 min  Charges: OT General Charges $OT Visit: 1 Visit OT Treatments $Self Care/Home Management : 8-22 mins $Therapeutic Activity: 8-22 mins  Malachy Chamber, OTR/L Acute Rehab Services Office: [phone]   Layla Maw 06/24/2020, 10:48 AM

## 2020-06-24 NOTE — Consult Note (Signed)
  Reason for Consult: ' Please follow daily, extremely drowsy on Psych Meds, despite Depakote dose being cut'  Patient daily psych evaluations continued to be limited by his confusion and worsening cognitive impairment. Patient continues to be disoriented, he is only alert to self only. He is awake and alert, today presenting with loose associations, confabulation and illogical thinking. He tells me that Gwynne Edinger Brooke Bonito) is the president and " that they are giving him a hard time. " He also appears to have difficulty with long term memory and short term memory. He is unable to remember his dog name. Patient clinical presentation has improved some, he is not sedated or obtunded as he was yesterday. He continues to be restrained with bilateral wrist restraints, with limited to no nursing documentation. There continues to lack nursing documentation in his chart, to reflect his behaviors.   Treatment Plan -Continue to Recommend 1:1 safety sitter as she remains confused, restless and agitated. Safety sitters offer supportive interaction with patient, and can easily identify patient needs, reorient, and address environmental factors that contribute to the delirium. If patient requires restraints, recommend a safety sitter also. As noted patient remains in restraints, it is important we make our best attempt to reduce restraints in order to reduce his level of agitation and aggression.  Patient has develeoped delirium therefore we can continue delirium precautions.  Please note it is wise to have a safety sitter at bedside while patient is in restraints.  No safety sitter was noted at bedside during the evaluation. Safety sitters offer supportive interaction with patient, and can easily identify patient needs, reorient, and address environmental factors that contribute to the delirium.  If patient remains agitated and trying to remove medical monitoring devices or IVs, may need to consider discontinuing hiding  unnecessary devices to further decrease delirium and agitation.   -Continue recommendations with physical therapy, early mobilization does help with cognitive stimulation and improves delirium.  -Please continue to seek other causes of his worsening cognitive impairment, as it may not be solely related to his psychotropic medications.   -Will resume Seroquel 25mg  po BID prn for dementia with behavioral disturbances, and psychosis.Last EKG ordered on 04/14 QTc of 450, EKG ordered yesterday has not been obtained.   -Depakote level (0.39 on 04/26), Dose adjustments made by primary team due to concerns about sedation. Depakote sprinkles at 125mg  po BID.    Recommend geropsych inpatient once medically stable.  -Recommend TOC consult for inpatient Geropsych referral.

## 2020-06-24 NOTE — Progress Notes (Signed)
Patient's watch was digging into his arm underneath restraints.  Watch placed inside his shoe in the patient closet.

## 2020-06-24 NOTE — Progress Notes (Addendum)
Sitter at bedside, restraints discontinued.  Blanchable redness noted in the gluteal crease. A mepilex was applied to protect the area.  The sitter reported almost continuous attempts to leave the bed once the patient's family left.    The patient removed his external catheter x2, his sacral mepilex x2 and attempted to remove his IV.   Pt's words became somewhat hostile and agitation was increasing.  Seroquel given with HOS sleep medications.    Pt maintained continuous attempts to leave the bed once the sitter had left at 2300.  MD was notified to obtain a new order for restraints.  Pt voiced that he needed to void and he stood at the edge of the bed and used the urinal.  Pt is very weak at this time and 2 person lift was needed for safety.  Pt also transferred to [facility] with assistance and had a very small bowl movement.

## 2020-06-24 NOTE — Progress Notes (Signed)
Overnight:    Pt had a period without restraints while a sitter was in the room from.  Pt had repeated attempts to exit the bed.  Pt was not combative but has been difficult to redirect and reorient at times.  Pt continues to remove condom catheter several times a night and was successful in removing an IV as well.  IV team was called to place another.  Pt continues to attempt to exit the bed while restraints are in place and was repositioned several times over the course of the night.

## 2020-06-24 NOTE — Progress Notes (Signed)
  Speech Language Pathology Treatment: Dysphagia  Patient Details Name: Patrick Phillips MRN: [id number] DOB: 03-19-1940 Today's Date: 06/24/2020 Time: 7371-0626 SLP Time Calculation (min) (ACUTE ONLY): 22.1 min  Assessment / Plan / Recommendation Clinical Impression  Pt was seen during breakfast for dysphagia treatment. Pt was adequately alert, but confused and his responses were consistently unrelated to SLP's questions. He consumed a meal of pancakes, sausage, and thin liquids. Pt required cueing for bolus acceptance and manipulation. Mastication of meats was prolonged, but functional for other solids. Oral clearance was adequate with intermittent need for a liquid wash with meats. Pt continues to inconsistently demonstrate throat clearing with thin liquids via straw and coughing was noted once with use of a cup. Pt's presentation is remarkably different from when he was initially evaluated on 4/20, but his mentation is also worse than on that date and the impact of this on his bolus awareness and swallow function is considered. A modified barium swallow study is recommended to further assess swallow function and is scheduled for today at 1330.    ue HPI: Pt is a 80 y.o. male with PMHx of vascular dementia, CVA, DM-2, HTN. On 4/14, pt took his dog for a walk and the dog returned home without him. Pt was found lying on the side of road with no memory of what had happened but able to follow commands. Pt was taken to [facility] and d/c home with daughter following negative workup, but pt was brough to [facility] ED due to increased confusion. Pt found to be febrile and COVID+ and admitted for acute metabolic encephalopathy. MRI brain 4/14 negative for acute changes. CXR 4/15 negative for active disease. EEG 4/16: mild to moderate diffuse encephalopathy, nonspecific etiology. No seizures or epileptiform discharges. Psych consulted due to severe delirium.      SLP Plan  MBS       Recommendations  Diet recommendations:  Regular;Thin liquid Liquids provided via: Cup;No straw Medication Administration: Whole meds with puree Supervision: Staff to assist with self feeding Compensations: Minimize environmental distractions;Slow rate;Small sips/bites Postural Changes and/or Swallow Maneuvers: Seated upright 90 degrees                Oral Care Recommendations: Oral care BID SLP Visit Diagnosis: Dysphagia, unspecified (R13.10) Plan: MBS       Patrick Phillips, [place], [place] Office number [phone] Pager [place] 06/24/2020, 9:36 AM

## 2020-06-24 NOTE — Progress Notes (Signed)
PROGRESS NOTE                                                                                                                                                                                                             Patient Demographics:    Patrick Phillips, is a 80 y.o. male, DOB - 1940-04-23, [id number]  Outpatient Primary MD for the patient is [name], MD   Admit date - 06/10/2020   LOS - 25  Chief Complaint  Patient presents with  . Altered Mental Status       Brief Narrative: Patient is a 80 y.o. male with PMHx of vascular dementia, prior CVA, DM-2, HTN-who presented with fever and confusion.  COVID-19 vaccinated status:  Unknown  Significant Events:  4/14>> Admit to [facility] for fever/confusion  Significant studies:  4/14>>Chest x-ray: No PNA 4/14>> CT head: No acute intracranial abnormality. 4/14>> CT C-spine: No fracture 4/14>> MRI brain: No acute infarct. 4/15>> chest x-ray: No PNA 4/15>> CSF: WBC 1 (tube #4), protein 46, glucose 54 4/16>> EEG: Mild to moderate diffuse encephalopathy-no seizures.  COVID-19 medications:  Remdesivir: 4/15>>  Antibiotics:  Vancomycin: 4/14 x1 Ceftriaxone: 4/14 x 1 Ampicillin: 4/14 x 1  Microbiology data:  4/14 >>blood culture: No growth 4/14>> urine culture: No growth 4/15>> blood culture: No growth 4/15>> urine culture: Multiple species 4/15>> CSF culture: No growth 4/15>> CSF PCR HSV 1/2: Negative. 4/15>> CSF HSV culture: Negative 4/15>> CSF arbovirus IgG: Negative 4/15>> CSF VDRL: Negative RMSF CSF serology IgM -ve  Procedures:  4/15>> lumbar puncture in the emergency room  Consults:  Neurology, psychiatry  DVT prophylaxis:  enoxaparin (LOVENOX) injection 40 mg Start: 06/11/20 0600    Subjective:   Patient, still quite confused but overall more awake than yesterday, he denies headache chest or abdominal pain.   Assessment  & Plan :    Acute metabolic encephalopathy-superimposed on dementia with delirium: Encephalopathy on presentation was likely related to febrile illness/COVID-19 infection-all culture data negative so far.  CT head, MRI brain and EEG unremarkable. He has had no fever since admission-his encephalopathy that he had on admission has resolved-I suspect his confusion/agitation and  now is related to delirium associated with dementia.    Evaluated by psychiatry and follows started on Saphris and Depakote-  Saphris 5 twice daily but it was stopped on 06/23/20 due to somnolence, continue present dose Depakote, he is less somnolent agitated on 06/24/2020, as needed and told to continue.  Continue to monitor.  ? Tick Bite: 2 ticks found by nursing staff on patient 06/17/20 - RMSF IgM - ve, DW ID Dr Tommy Medal 06/23/20 - not acute, no Rx.   AKI: Mild-likely hemodynamically mediated-resolved with IV fluids.  Borderline vitamin B12 deficiency: On supplementation.  Hypokalemia: Repleted-recheck periodically.  HTN: Currently on Coreg, Norvasc, added hydralazine for better control.  History of CVA-MRI brain negative for infarct-on aspirin/statin.  Dementia: On Namenda  GERD: On PPI  SIRS-COVID-19 infection: Completed Remdesivir x3 days.  Not hypoxic-no pneumonia-does not require any further therapy.  Interestingly-patient apparently had COVID-19 infection in February as well.  Has finished his COVID isolation on 06/21/2020  Recent Labs    06/22/20 0739 06/23/20 0420  CRP 0.6 0.6    Lab Results  Component Value Date   SARSCOV2NAA POSITIVE (A) 06/11/2020   [place] NEGATIVE 01/13/2020   DM-2: CBGs relatively stable-continue SSI-oral hypoglycemic agents on hold  Recent Labs    06/23/20 1634 06/23/20 2006 06/24/20 0801  GLUCAP 203* 196* 171*     Condition -   Guarded  Family Communication  :   Daughter Misty-[phone]-updated over the phone on 4/22   Daughter- Brenda-[phone] at bedside on  4/21, 06/19/20, 06/21/20, 06/23/20, 06/25/2018   Code Status :  Full Code  Diet :  Diet Order            Diet regular Room service appropriate? No; Fluid consistency: Thin  Diet effective now                  Disposition Plan  :   Status is: Inpatient  Remains inpatient appropriate because:Inpatient level of care appropriate due to severity of illness   Dispo: The patient is from: Home              Anticipated d/c is to: Home              Patient currently is not medically stable to d/c.   Difficult to place patient No   Barriers to discharge: Resolving encephalopathy-awaiting final culture data.  Antimicorbials  :    Anti-infectives (From admission, onward)   Start     Dose/Rate Route Frequency Ordered Stop   06/18/20 1745  doxycycline (VIBRAMYCIN) 100 mg in sodium chloride 0.9 % 250 mL IVPB  Status:  Discontinued        100 mg 125 mL/hr over 120 Minutes Intravenous Every 12 hours 06/18/20 1648 06/23/20 0847   06/12/20 1000  remdesivir 100 mg in sodium chloride 0.9 % 100 mL IVPB        100 mg 200 mL/hr over 30 Minutes Intravenous Daily 06/11/20 0435 06/13/20 0900   06/11/20 0445  remdesivir 200 mg in sodium chloride 0.9% 250 mL IVPB        200 mg 580 mL/hr over 30 Minutes Intravenous Once 06/11/20 0434 06/11/20 0929   06/11/20 0230  vancomycin (VANCOREADY) IVPB 1500 mg/300 mL        1,500 mg 150 mL/hr over 120 Minutes Intravenous  Once 06/11/20 0229 06/11/20 0812   06/11/20 0215  ampicillin (OMNIPEN) 2 g in sodium chloride 0.9 % 100 mL IVPB  2 g 300 mL/hr over 20 Minutes Intravenous  Once 06/11/20 0157 06/11/20 0550   06/11/20 0215  vancomycin (VANCOREADY) IVPB 1000 mg/200 mL  Status:  Discontinued        1,000 mg 200 mL/hr over 60 Minutes Intravenous  Once 06/11/20 0157 06/11/20 0229   06/11/20 0200  cefTRIAXone (ROCEPHIN) 2 g in sodium chloride 0.9 % 100 mL IVPB        2 g 200 mL/hr over 30 Minutes Intravenous  Once 06/11/20 0157 06/11/20 0248       Inpatient Medications  Scheduled Meds: . amLODipine  10 mg Oral Daily  . aspirin EC  81 mg Oral Daily  . atorvastatin  40 mg Oral Daily  . carvedilol  6.25 mg Oral BID WC  . divalproex  125 mg Oral Q12H  . enoxaparin (LOVENOX) injection  40 mg Subcutaneous Q24H  . feeding supplement  237 mL Oral BID BM  . hydrALAZINE  50 mg Oral Q8H  . insulin aspart  0-9 Units Subcutaneous TID WC  . multivitamin with minerals  1 tablet Oral Daily  . pantoprazole  40 mg Oral Daily  . tamsulosin  0.4 mg Oral Daily  . thiamine  100 mg Oral Daily  . vitamin B-12  1,000 mcg Oral Daily   Continuous Infusions:  PRN Meds:.acetaminophen, haloperidol lactate, hydrALAZINE, [DISCONTINUED] ondansetron **OR** ondansetron (ZOFRAN) IV, QUEtiapine   Time Spent in minutes  25    See all Orders from today for further details   Lala Lund M.D on 06/24/2020 at 11:01 AM  To page go to [email or web address] - use universal password  [facility] -  Office  [phone]    Objective:   Vitals:   06/24/20 0009 06/24/20 0400 06/24/20 0613 06/24/20 0757  BP: (!) 152/74 (!) 156/65 (!) 141/59 (!) 179/69  Pulse: 75 76    Resp: 18 18  18   Temp: 98.3 F (36.8 C) 98.3 F (36.8 C)  98.7 F (37.1 C)  TempSrc: Oral Axillary  Oral  SpO2: 94% 96%    Weight:      Height:        Wt Readings from Last 3 Encounters:  06/10/20 86.2 kg  06/10/20 86.2 kg  04/19/20 86.2 kg     Intake/Output Summary (Last 24 hours) at 06/24/2020 1101 Last data filed at 06/23/2020 2334 Gross per 24 hour  Intake --  Output 300 ml  Net -300 ml     Physical Exam  Awake and not somnolent this morning, still quite confused, mild lower extremities by himself, no focal deficits apparent [place].AT,PERRAL Supple Neck,No JVD, No cervical lymphadenopathy appriciated.  Symmetrical Chest wall movement, Good air movement bilaterally, CTAB RRR,No Gallops, Rubs or new Murmurs, No Parasternal Heave +ve B.Sounds, Abd Soft, No tenderness,  No organomegaly appriciated, No rebound - guarding or rigidity. No Cyanosis, Clubbing or edema, No new Rash or bruise     Data Review:    CBC Recent Labs  Lab 06/20/20 0110 06/21/20 0141 06/22/20 0739 06/23/20 0608  WBC 9.5 10.0 11.6* 10.2  HGB 14.2 13.9 15.1 13.2  HCT 43.0 42.2 46.3 40.5  PLT 206 230 268 238  MCV 86.2 85.4 86.2 86.5  MCH 28.5 28.1 28.1 28.2  MCHC 33.0 32.9 32.6 32.6  RDW 13.5 13.6 13.9 14.1  LYMPHSABS 3.4 3.6 3.0  --   MONOABS 0.6 0.7 0.8  --   EOSABS 0.2 0.2 0.2  --   BASOSABS 0.0 0.0 0.0  --  Chemistries  Recent Labs  Lab 06/20/20 0110 06/21/20 0141 06/22/20 0739 06/23/20 0420 06/24/20 0725  NA 138 140 140 140 138  K 4.6 4.2 4.1 3.7 4.0  CL 106 108 105 105 105  CO2 24 23 25 23 25   GLUCOSE 185* 134* 150* 132* 178*  BUN 17 19 23  25* 23  CREATININE 1.02 1.11 1.08 1.12 1.10  CALCIUM 9.3 9.3 9.8 9.6 9.1  MG 2.1 2.1 2.2 2.2 2.1  AST 71* 40 34 32  --   ALT 98* 74* 63* 54*  --   ALKPHOS 53 52 62 58  --   BILITOT 0.9 0.7 0.8 1.2  --    ------------------------------------------------------------------------------------------------------------------ No results for input(s): CHOL, HDL, LDLCALC, TRIG, CHOLHDL, LDLDIRECT in the last 72 hours.  Lab Results  Component Value Date   HGBA1C 6.6 (H) 06/11/2020   ------------------------------------------------------------------------------------------------------------------ No results for input(s): TSH, T4TOTAL, T3FREE, THYROIDAB in the last 72 hours.  Invalid input(s): FREET3 ------------------------------------------------------------------------------------------------------------------ No results for input(s): VITAMINB12, FOLATE, FERRITIN, TIBC, IRON, RETICCTPCT in the last 72 hours.  Coagulation profile No results for input(s): INR, PROTIME in the last 168 hours.  No results for input(s): DDIMER in the last 72 hours.  Cardiac Enzymes No results for input(s): CKMB, TROPONINI, MYOGLOBIN  in the last 168 hours.  Invalid input(s): CK ------------------------------------------------------------------------------------------------------------------    Component Value Date/Time   BNP 21.0 06/23/2020 0608    Micro Results No results found for this or any previous visit (from the past 240 hour(s)).  Radiology Reports CT Head Wo Contrast  Result Date: 06/10/2020 CLINICAL DATA:  Head and C-spine injury, fall, altered mental status EXAM: CT HEAD WITHOUT CONTRAST CT CERVICAL SPINE WITHOUT CONTRAST TECHNIQUE: Multidetector CT imaging of the head and cervical spine was performed following the standard protocol without intravenous contrast. Multiplanar CT image reconstructions of the cervical spine were also generated. COMPARISON:  05/04/2020 FINDINGS: CT HEAD FINDINGS Brain: Stable mild brain atrophy pattern. No acute intracranial hemorrhage, new mass lesion, acute infarction, midline shift, herniation, hydrocephalus, or extra-axial fluid collection. No focal mass effect or edema. Cisterns are patent. Cerebellar atrophy as well. Vascular: Intracranial atherosclerosis at the skull base. No hyperdense vessel. Skull: Normal. Negative for fracture or focal lesion. Sinuses/Orbits: Minor scattered sinus mucosal thickening as before. Orbits are symmetric. No acute finding. Other: None. CT CERVICAL SPINE FINDINGS Alignment: Normal. Skull base and vertebrae: No acute fracture. No primary bone lesion or focal pathologic process. Soft tissues and spinal canal: Normal prevertebral soft tissues. No hemorrhage or hematoma appreciated. Carotid atherosclerosis. Disc levels: Similar mild multilevel degenerative disc disease and facet arthropathy. Facets are aligned. No subluxation or dislocation. Preserved vertebral body heights. Upper chest: Negative. Other: None. IMPRESSION: Stable brain atrophy pattern. No acute intracranial abnormality or interval change by noncontrast CT. Stable mild cervical degenerative  changes as above. No acute osseous finding or malalignment by CT. Electronically Signed   By: Jerilynn Mages.  Shick M.D.   On: 06/10/2020 12:31   CT Cervical Spine Wo Contrast  Result Date: 06/10/2020 CLINICAL DATA:  Head and C-spine injury, fall, altered mental status EXAM: CT HEAD WITHOUT CONTRAST CT CERVICAL SPINE WITHOUT CONTRAST TECHNIQUE: Multidetector CT imaging of the head and cervical spine was performed following the standard protocol without intravenous contrast. Multiplanar CT image reconstructions of the cervical spine were also generated. COMPARISON:  05/04/2020 FINDINGS: CT HEAD FINDINGS Brain: Stable mild brain atrophy pattern. No acute intracranial hemorrhage, new mass lesion, acute infarction, midline shift, herniation, hydrocephalus, or extra-axial fluid collection. No  focal mass effect or edema. Cisterns are patent. Cerebellar atrophy as well. Vascular: Intracranial atherosclerosis at the skull base. No hyperdense vessel. Skull: Normal. Negative for fracture or focal lesion. Sinuses/Orbits: Minor scattered sinus mucosal thickening as before. Orbits are symmetric. No acute finding. Other: None. CT CERVICAL SPINE FINDINGS Alignment: Normal. Skull base and vertebrae: No acute fracture. No primary bone lesion or focal pathologic process. Soft tissues and spinal canal: Normal prevertebral soft tissues. No hemorrhage or hematoma appreciated. Carotid atherosclerosis. Disc levels: Similar mild multilevel degenerative disc disease and facet arthropathy. Facets are aligned. No subluxation or dislocation. Preserved vertebral body heights. Upper chest: Negative. Other: None. IMPRESSION: Stable brain atrophy pattern. No acute intracranial abnormality or interval change by noncontrast CT. Stable mild cervical degenerative changes as above. No acute osseous finding or malalignment by CT. Electronically Signed   By: Jerilynn Mages.  Shick M.D.   On: 06/10/2020 12:31   MR Brain Wo Contrast (neuro protocol)  Result Date:  06/10/2020 CLINICAL DATA:  Acute neuro deficit.  Mental status change. EXAM: MRI HEAD WITHOUT CONTRAST TECHNIQUE: Multiplanar, multiecho pulse sequences of the brain and surrounding structures were obtained without intravenous contrast. COMPARISON:  CT head 06/10/2020 FINDINGS: Brain: Ventricle size and cerebral volume normal for age. Negative for acute infarct. No significant chronic ischemic change. Negative for hemorrhage or mass. Image quality degraded by mild motion. Vascular: Partial loss of flow void in the left internal carotid artery through the skull base and the distal left vertebral artery which may be due to slow flow or stenosis. Skull and upper cervical spine: Negative Sinuses/Orbits: Mild mucosal edema paranasal sinuses. No orbital mass. Bilateral exophthalmos. Other: None IMPRESSION: Negative for acute infarct. Partial loss of flow void in the left internal carotid artery distal left vertebral artery which could be due to proximal stenosis. Consider CT angio head and neck for further evaluation. Electronically Signed   By: Franchot Gallo M.D.   On: 06/10/2020 15:09   DG Chest Port 1 View  Result Date: 06/24/2020 CLINICAL DATA:  Shortness of breath. EXAM: PORTABLE CHEST 1 VIEW COMPARISON:  June 20, 2020. FINDINGS: The heart size and mediastinal contours are within normal limits. Both lungs are clear. The visualized skeletal structures are unremarkable. IMPRESSION: No active disease. Electronically Signed   By: Marijo Conception M.D.   On: 06/24/2020 08:13   DG Chest Port 1 View  Result Date: 06/20/2020 CLINICAL DATA:  Shortness of breath EXAM: PORTABLE CHEST 1 VIEW COMPARISON:  06/11/2020 FINDINGS: Loop recorder is identified adjacent to the cardiac apex. Normal heart size. No pleural effusion or edema. No airspace densities identified. No acute osseous findings. IMPRESSION: No acute cardiopulmonary abnormalities. Electronically Signed   By: Kerby Moors M.D.   On: 06/20/2020 08:34   DG  Chest Port 1 View  Result Date: 06/11/2020 CLINICAL DATA:  Questionable sepsis. EXAM: PORTABLE CHEST 1 VIEW COMPARISON:  June 10, 2020 FINDINGS: There is no evidence of acute infiltrate, pleural effusion or pneumothorax. The heart size and mediastinal contours are within normal limits. A radiopaque loop recorder device is seen. The visualized skeletal structures are unremarkable. IMPRESSION: No active disease. Electronically Signed   By: Virgina Norfolk M.D.   On: 06/11/2020 03:10   DG Chest Port 1 View  Result Date: 06/10/2020 CLINICAL DATA:  Altered mental status. EXAM: PORTABLE CHEST 1 VIEW COMPARISON:  May 25, 2020 FINDINGS: Loop recorder in place. Cardiomediastinal silhouette is normal. Mediastinal contours appear intact. Tortuosity and calcific atherosclerotic disease of the aorta. There is  no evidence of focal airspace consolidation, pleural effusion or pneumothorax. Osseous structures are without acute abnormality. Soft tissues are grossly normal. IMPRESSION: No active disease. Electronically Signed   By: Fidela Salisbury M.D.   On: 06/10/2020 11:15   EEG adult  Result Date: 06/12/2020 Lora Havens, MD     06/12/2020  3:07 PM Patient Name: Giovanne Nickolson MRN: [id number] Epilepsy Attending: Lora Havens Referring Physician/Provider: Dr Oren Binet Date: 06/12/2020 Duration: 25.42 mins Patient history: 81yo M with ams. EEG to evaluate for seizure Level of alertness: Awake AEDs during EEG study: None Technical aspects: This EEG study was done with scalp electrodes positioned according to the 10-20 International system of electrode placement. Electrical activity was acquired at a sampling rate of 500Hz  and reviewed with a high frequency filter of 70Hz  and a low frequency filter of 1Hz . EEG data were recorded continuously and digitally stored. Description: No posterior dominant rhythm was seen. EEG showed continuous generalized 5 to 6 Hz theta slowing. Hyperventilation and photic  stimulation were not performed.   ABNORMALITY - Continuous slow, generalized IMPRESSION: This study is suggestive of mild to moderate diffuse encephalopathy, nonspecific etiology. No seizures or epileptiform discharges were seen throughout the recording. Priyanka Barbra Sarks   DG Hips Bilat W or Wo Pelvis 3-4 Views  Result Date: 06/10/2020 CLINICAL DATA:  Found on side of road, last seen normal at 2130 hours last night, fall, dementia, tenderness of hips bilaterally EXAM: DG HIP (WITH OR WITHOUT PELVIS) 3-4V BILAT COMPARISON:  None FINDINGS: Osseous demineralization. Hip and SI joint spaces preserved. No acute fracture, dislocation, or bone destruction. Mild degenerative disc disease changes at visualized lower lumbar spine. IMPRESSION: No acute osseous abnormalities. Electronically Signed   By: Lavonia Dana M.D.   On: 06/10/2020 11:48

## 2020-06-25 ENCOUNTER — Inpatient Hospital Stay (HOSPITAL_COMMUNITY): Payer: Medicare Other

## 2020-06-25 DIAGNOSIS — Z515 Encounter for palliative care: Secondary | ICD-10-CM | POA: Diagnosis not present

## 2020-06-25 LAB — CBC WITH DIFFERENTIAL/PLATELET
Abs Immature Granulocytes: 0.03 10*3/uL (ref 0.00–0.07)
Basophils Absolute: 0 10*3/uL (ref 0.0–0.1)
Basophils Relative: 0 %
Eosinophils Absolute: 0.2 10*3/uL (ref 0.0–0.5)
Eosinophils Relative: 2 %
HCT: 38.4 % — ABNORMAL LOW (ref 39.0–52.0)
Hemoglobin: 12.5 g/dL — ABNORMAL LOW (ref 13.0–17.0)
Immature Granulocytes: 0 %
Lymphocytes Relative: 33 %
Lymphs Abs: 3.9 10*3/uL (ref 0.7–4.0)
MCH: 28.2 pg (ref 26.0–34.0)
MCHC: 32.6 g/dL (ref 30.0–36.0)
MCV: 86.5 fL (ref 80.0–100.0)
Monocytes Absolute: 1 10*3/uL (ref 0.1–1.0)
Monocytes Relative: 8 %
Neutro Abs: 6.9 10*3/uL (ref 1.7–7.7)
Neutrophils Relative %: 57 %
Platelets: 222 10*3/uL (ref 150–400)
RBC: 4.44 MIL/uL (ref 4.22–5.81)
RDW: 13.7 % (ref 11.5–15.5)
WBC: 12.1 10*3/uL — ABNORMAL HIGH (ref 4.0–10.5)
nRBC: 0 % (ref 0.0–0.2)

## 2020-06-25 LAB — COMPREHENSIVE METABOLIC PANEL
ALT: 43 U/L (ref 0–44)
AST: 31 U/L (ref 15–41)
Albumin: 2.9 g/dL — ABNORMAL LOW (ref 3.5–5.0)
Alkaline Phosphatase: 54 U/L (ref 38–126)
Anion gap: 7 (ref 5–15)
BUN: 19 mg/dL (ref 8–23)
CO2: 26 mmol/L (ref 22–32)
Calcium: 9 mg/dL (ref 8.9–10.3)
Chloride: 107 mmol/L (ref 98–111)
Creatinine, Ser: 1.1 mg/dL (ref 0.61–1.24)
GFR, Estimated: >60 mL/min (ref >60)
Glucose, Bld: 129 mg/dL — ABNORMAL HIGH (ref 70–99)
Potassium: 3.5 mmol/L (ref 3.5–5.1)
Sodium: 140 mmol/L (ref 135–145)
Total Bilirubin: 0.8 mg/dL (ref 0.3–1.2)
Total Protein: 5.1 g/dL — ABNORMAL LOW (ref 6.5–8.1)

## 2020-06-25 LAB — GLUCOSE, CAPILLARY
Glucose-Capillary: 156 mg/dL — ABNORMAL HIGH (ref 70–99)
Glucose-Capillary: 246 mg/dL — ABNORMAL HIGH (ref 70–99)
Glucose-Capillary: 111 mg/dL — ABNORMAL HIGH (ref 70–99)
Glucose-Capillary: 200 mg/dL — ABNORMAL HIGH (ref 70–99)

## 2020-06-25 LAB — MAGNESIUM: Magnesium: 2.2 mg/dL (ref 1.7–2.4)

## 2020-06-25 LAB — C-REACTIVE PROTEIN: CRP: 0.6 mg/dL (ref <1.0)

## 2020-06-25 LAB — BRAIN NATRIURETIC PEPTIDE: B Natriuretic Peptide: 81.6 pg/mL (ref 0.0–100.0)

## 2020-06-25 MED ORDER — LACTATED RINGERS IV BOLUS
1000.0000 mL | Freq: Once | INTRAVENOUS | Status: AC
  Administered 2020-06-25: 1000 mL via INTRAVENOUS

## 2020-06-25 MED ORDER — POTASSIUM CHLORIDE CRYS ER 20 MEQ PO TBCR
40.0000 meq | EXTENDED_RELEASE_TABLET | Freq: Once | ORAL | Status: AC
  Administered 2020-06-25: 40 meq via ORAL
  Filled 2020-06-25: qty 2

## 2020-06-25 MED ORDER — NALOXONE HCL 0.4 MG/ML IJ SOLN: 0.4000 mg | Freq: Once | INTRAMUSCULAR | Status: DC

## 2020-06-25 MED ORDER — NALOXONE HCL 0.4 MG/ML IJ SOLN
INTRAMUSCULAR | Status: AC
  Filled 2020-06-25: qty 1

## 2020-06-25 MED ORDER — LACTATED RINGERS IV SOLN: INTRAVENOUS | Status: DC

## 2020-06-25 NOTE — Progress Notes (Signed)
  Speech Language Pathology Treatment: Dysphagia  Patient Details Name: Patrick Phillips MRN: [id number] DOB: 1940/03/20 Today's Date: 06/25/2020 Time: 8469-6295 SLP Time Calculation (min) (ACUTE ONLY): 17 min  Assessment / Plan / Recommendation Clinical Impression  Pt was seen during dinner for dysphagia treatment with his daughter present. Pt's daughter reported that the pt has been tolerating dinner without overt s/sx of aspiration and pt's RN denied signs of aspiration with lunch. Pt was alert , attention was improved, and his utterances were more meaningful than when he was seen yesterday. Mastication was more functional and oral clearance adequate. Cueing was not needed for mastication or use of a straw. Pt consumed a meal of a baked potato, meatloaf, green beans, and thin liquids via straw. No s/sx of aspiration were noted with solids or with individual boluses of thin liquids via straw. Pt's daughter indicated that the pt has been tolerating the softer meal wells today and it was agreed that his diet will be modified to dysphagia 3 solids and thin liquids. If pt's swallow function at least maintains at the level demonstrated today, a swallow study ultimately may not be necessary, but SLP will follow up next week to determine whether it is still warranted.   HPI HPI: Pt is a 80 y.o. male with PMHx of vascular dementia, CVA, DM-2, HTN. On 4/14, pt took his dog for a walk and the dog returned home without him. Pt was found lying on the side of road with no memory of what had happened but able to follow commands. Pt was taken to [facility] and d/c home with daughter following negative workup, but pt was brough to [facility] ED due to increased confusion. Pt found to be febrile and COVID+ and admitted for acute metabolic encephalopathy. MRI brain 4/14 negative for acute changes. CXR 4/15 negative for active disease. EEG 4/16: mild to moderate diffuse encephalopathy, nonspecific etiology. No seizures or epileptiform  discharges. Psych consulted due to severe delirium.      SLP Plan  Continue with current plan of care       Recommendations  Diet recommendations: Dysphagia 3 (mechanical soft);Thin liquid Liquids provided via: Cup;No straw Medication Administration: Whole meds with puree Supervision: Staff to assist with self feeding Compensations: Minimize environmental distractions;Slow rate;Small sips/bites Postural Changes and/or Swallow Maneuvers: Seated upright 90 degrees                Oral Care Recommendations: Oral care BID SLP Visit Diagnosis: Dysphagia, unspecified (R13.10) Plan: Continue with current plan of care       Patrick Phillips I. Patrick Phillips, Patrick Phillips, Patrick Phillips Office number [phone] Pager [place] 06/25/2020, 6:07 PM

## 2020-06-25 NOTE — Progress Notes (Signed)
Spoke with daughter, [name]. I updated [name] on the patient's status. We discussed the patient asking me for a drink. She stated "he means a diet Coke." I asked her if he had any previous history of alcohol misuse and she stated he hadn't drank heavily since 1978. I also informed her that he asked another nurse for a case of beer. She told me he will sometimes have a beer with her brother in the summertime, but she doesn't believe he's currently a heavy drinker. [name] explained that patient was very sick with significant diarrhea prior to admission. She also states his appetite is not what it has been in the past. [name] informed me that the patient will wear a 29 condom cath, but will "mess with a 25." [name] has concerns about the following - a urine culture performed on 4/15 that stated a recollect was needed, but hasn't been done, the patient's elevated WBCs, the patient's lower hemoglobin, and the patient's drop in blood pressure on day shift (information she saw on the patient's MyChart). I addressed her concerns and explained the criteria for blood transfusions. I shared the patient's current vital signs with her and explained that they are currently WDL. [name] also stated she has concerns for the patient having a possible GI issue because the patient has been complaining of abdominal pain, and because of the elevated WBCs. We discussed the patient currently being in 2-point sof twrist restraints. She understands the necessity of the restraint use. She stated that she is aware of, and has seen, the patient being extremely confused and combative.  I spoke with [name] about the possibility of vitamin deficiencies r/t the patient's poor PO intake and that I would share the information with the attending provider. She verbalized understanding and stated 'I greatly appreciate." I also told her that I would call and update her sister as well. She thanked me.

## 2020-06-25 NOTE — Progress Notes (Signed)
Physical Therapy Treatment Patient Details Name: Patrick Phillips MRN: [id number] DOB: [birth year] Today's Date: 06/25/2020    History of Present Illness 80 y.o. male with presented with fever and confusion. Pt with dementia however still highly functional, took dog for a walk and dog came back without him. Pt later found lying on the side of road with no memory of what had happened but able to follow commands, taken to [facility] where MRI was negative for stroke and x-ray negative for fx. D/c home with daughter but pt with increasing confusion pt then brought  to [facility] ED 06/10/20. Pt with increasing confusion and agitation. Pt found to be febrile and COVID+. Admitted for acute metabolic encephalopathy.    PMHx of vascular dementia, prior CVA, DM-2, HTN    PT Comments    Pt very lethargic on entry, and despite increased tactile and verbal cuing did not open eyes. Required total Ax2 to come to EoB where he only opened eyes once or twice and laid himself back in bed. After pt was settled back in supine, pt sat up in bed declaring that he needed to go to the bathroom. Pt was then min guard for bed mobility and modAx1-2 for transfers to/from [facility] and modA for maintaining balance for pericare. Pt able to step towards HoB with modA. D/c plans remain appropriate at this time. PT will continue to follow acutely.     Follow Up Recommendations  SNF;Other (comment) (vs memory care)     Equipment Recommendations  None recommended by PT       Precautions / Restrictions Precautions Precautions: Fall Precaution Comments: found down along side of road; h/o combativeness with dementia Restrictions Weight Bearing Restrictions: No    Mobility  Bed Mobility Overal bed mobility: Needs Assistance Bed Mobility: Supine to Sit;Sit to Supine     Supine to sit: HOB elevated;Total assist;Min guard Sit to supine: HOB elevated;Mod assist   General bed mobility comments: due to pt lethargy requires total A for initial  tranfer to EoB, and modAx for managing LE back into bed, after return to supine he sat back up reporting he needed to have a BM and was able to move to EoB with min guard, after using BSC requires modA for management of LE back into bed    Transfers Overall transfer level: Needs assistance Equipment used: 2 person hand held assist;1 person hand held assist Transfers: Sit to/from [facility] Sit to Stand: Min assist;+2 physical assistance Stand pivot transfers: Mod assist;+2 physical assistance       General transfer comment: minAx2 for power up from bed and modA for pivoting over to [facility], after finished requires min face to face A for standing and modA for maintaining standing for pericare.  Ambulation/Gait Ambulation/Gait assistance: Mod assist Gait Distance (Feet): 3 Feet Assistive device: 1 person hand held assist Gait Pattern/deviations: Decreased step length - right;Decreased step length - left;Shuffle;Trunk flexed;Step-to pattern Gait velocity: slowed Gait velocity interpretation: <1.31 ft/sec, indicative of household ambulator General Gait Details: modA for face to face pivot from [facility] and stepping towards HoB       Balance Overall balance assessment: Needs assistance Sitting-balance support: Feet supported;No upper extremity supported Sitting balance-Leahy Scale: Fair Sitting balance - Comments: close min guard for safety     Standing balance-Leahy Scale: Poor Standing balance comment: requires outside assist to maintain balance  Cognition Arousal/Alertness: Lethargic Behavior During Therapy: Restless Overall Cognitive Status: History of cognitive impairments - at baseline Area of Impairment: Attention;Memory;Following commands;Safety/judgement;Awareness;Problem solving                 Orientation Level: Disoriented to;Time;Place;Situation Current Attention Level: Selective Memory: Decreased recall of  precautions;Decreased short-term memory Following Commands: Follows one step commands inconsistently Safety/Judgement: Decreased awareness of deficits;Decreased awareness of safety Awareness: Intellectual Problem Solving: Requires verbal cues;Requires tactile cues;Difficulty sequencing;Slow processing General Comments: pt very lethargic today despite sternal rub, cold cloth on face, only opened eyes once brought to EoB         General Comments General comments (skin integrity, edema, etc.): Pt with increased lethargy requiring increased stimulation for initial mobility, returned to supine and closed eyes after using [facility]      Pertinent Vitals/Pain Pain Assessment: Faces Faces Pain Scale: Hurts a little bit Pain Location: general discomfort Pain Descriptors / Indicators: Discomfort;Grimacing Pain Intervention(s): Limited activity within patient's tolerance;Monitored during session;Repositioned           PT Goals (current goals can now be found in the care plan section) Acute Rehab PT Goals PT Goal Formulation: With patient/family Time For Goal Achievement: 06/26/20 Potential to Achieve Goals: Good Progress towards PT goals: Not progressing toward goals - comment (very lethargic)    Frequency    Min 3X/week      PT Plan Current plan remains appropriate       AM-PAC PT "6 Clicks" Mobility   Outcome Measure  Help needed turning from your back to your side while in a flat bed without using bedrails?: None Help needed moving from lying on your back to sitting on the side of a flat bed without using bedrails?: A Little Help needed moving to and from a bed to a chair (including a wheelchair)?: A Lot Help needed standing up from a chair using your arms (e.g., wheelchair or bedside chair)?: A Lot Help needed to walk in hospital room?: A Lot Help needed climbing 3-5 steps with a railing? : Total 6 Click Score: 14    End of Session   Activity Tolerance: Patient tolerated  treatment well Patient left: in bed;with call bell/phone within reach;with nursing/sitter in room Nurse Communication: Mobility status PT Visit Diagnosis: Unsteadiness on feet (R26.81);Muscle weakness (generalized) (M62.81)     Time: 9741-6384 PT Time Calculation (min) (ACUTE ONLY): 29 min  Charges:  $Therapeutic Activity: 23-37 mins                     Maritza Goldsborough B. Migdalia Dk PT, DPT Acute Rehabilitation Services Pager [phone] Office [phone]    [place] 06/25/2020, 12:10 PM

## 2020-06-25 NOTE — Progress Notes (Addendum)
0730: patient resting comfortable, safety sitter at bedside, restraints currently removed. Patient still presents with significant confusion   1230: patient presents lethargic with BP 79/40. Dr. Candiss Norse notified orders for fluid bolus, Narcan received and given  1315: patient responds positively to interventions BP increased, mentation increased still remarkably confused  1500: 1 to 1 sitter removed from assignment patient placed back in restraints as he has attempted multiple times to get up from bed in confused state with generalized weakness and unsteady gait

## 2020-06-25 NOTE — Progress Notes (Addendum)
PROGRESS NOTE                                                                                                                                                                                                             Patient Demographics:    Patrick Phillips, is a 80 y.o. male, DOB - 1941/02/14, [id number]  Outpatient Primary MD for the patient is [name], MD   Admit date - 06/10/2020   LOS - 57  Chief Complaint  Patient presents with  . Altered Mental Status       Brief Narrative: Patient is a 80 y.o. male with PMHx of vascular dementia, prior CVA, DM-2, HTN-who presented with fever and confusion.  COVID-19 vaccinated status:  Unknown  Significant Events:  4/14>> Admit to [facility] for fever/confusion  Significant studies:  4/14>>Chest x-ray: No PNA 4/14>> CT head: No acute intracranial abnormality. 4/14>> CT C-spine: No fracture 4/14>> MRI brain: No acute infarct. 4/15>> chest x-ray: No PNA 4/15>> CSF: WBC 1 (tube #4), protein 46, glucose 54 4/16>> EEG: Mild to moderate diffuse encephalopathy-no seizures.  COVID-19 medications:  Remdesivir: 4/15>>  Antibiotics:  Vancomycin: 4/14 x1 Ceftriaxone: 4/14 x 1 Ampicillin: 4/14 x 1  Microbiology data:  4/14 >>blood culture: No growth 4/14>> urine culture: No growth 4/15>> blood culture: No growth 4/15>> urine culture: Multiple species 4/15>> CSF culture: No growth 4/15>> CSF PCR HSV 1/2: Negative. 4/15>> CSF HSV culture: Negative 4/15>> CSF arbovirus IgG: Negative 4/15>> CSF VDRL: Negative RMSF CSF serology IgM -ve  Procedures:  4/15>> lumbar puncture in the emergency room  Consults:  Neurology, psychiatry  DVT prophylaxis:  enoxaparin (LOVENOX) injection 40 mg Start: 06/11/20 0600    Subjective:   Patient in bed sleeping but easily arousable, appears to be in no distress but confused poor historian but denies any headache or chest  pain.   Assessment  & Plan :   Acute metabolic encephalopathy-superimposed on underlying Dementia with delirium: Encephalopathy on presentation was likely related to febrile illness/COVID-19 infection-all culture data negative so far.  CT head, MRI brain and EEG unremarkable. He has had no fever since admission-his encephalopathy I suspect his  confusion/agitation now is related to delirium associated with dementia. Neuro and Psych consulted, Psych requested by me to follow daily.  Evaluated by psychiatry and follows started on Saphris and Depakote-  Saphris 5 twice daily but it was stopped on 06/23/20 due to somnolence, continue present dose Depakote, Seroquel started 06/24/20 - hold 06/25/20 as sleepy, monitor closely.  Overall little improvement, will involve Pall.Care for [place].    ? Tick Bite: 2 ticks found by nursing staff on patient 06/17/20 - RMSF IgM - ve, DW ID Dr Tommy Medal 06/23/20 - not acute, no Rx.   AKI: Mild-likely hemodynamically mediated-resolved with IV fluids.  Borderline vitamin B12 deficiency: On supplementation.  Hypokalemia: Repleted-recheck periodically.  HTN: Currently on Coreg, Norvasc, added hydralazine for better control.  History of CVA-MRI brain negative for infarct-on aspirin/statin.  Dementia: On Namenda  GERD: On PPI  SIRS-COVID-19 infection: Completed Remdesivir x3 days.  Not hypoxic-no pneumonia-does not require any further therapy.  Interestingly-patient apparently had COVID-19 infection in February as well.  Has finished his COVID isolation on 06/21/2020  Recent Labs    06/23/20 0420 06/25/20 0332  CRP 0.6 0.6    Lab Results  Component Value Date   SARSCOV2NAA POSITIVE (A) 06/11/2020   [place] NEGATIVE 01/13/2020   DM-2: CBGs relatively stable-continue SSI-oral hypoglycemic agents on hold  Recent Labs    06/24/20 1204 06/24/20 1706 06/25/20 0837  GLUCAP 157* 119* 156*     Condition -   Guarded  Family Communication  :   Daughter  Misty-[phone]-updated over the phone on 4/22   Daughter- Brenda-[phone] at bedside on 4/21, 06/19/20, 06/21/20, 06/23/20, 06/25/2018   Code Status :  Full Code  Diet :  Diet Order            Diet regular Room service appropriate? No; Fluid consistency: Thin  Diet effective now                  Disposition Plan  :   Status is: Inpatient  Remains inpatient appropriate because:Inpatient level of care appropriate due to severity of illness   Dispo: The patient is from: Home              Anticipated d/c is to: Home              Patient currently is not medically stable to d/c.   Difficult to place patient No   Barriers to discharge: Resolving encephalopathy-awaiting final culture data.  Antimicorbials  :    Anti-infectives (From admission, onward)   Start     Dose/Rate Route Frequency Ordered Stop   06/18/20 1745  doxycycline (VIBRAMYCIN) 100 mg in sodium chloride 0.9 % 250 mL IVPB  Status:  Discontinued        100 mg 125 mL/hr over 120 Minutes Intravenous Every 12 hours 06/18/20 1648 06/23/20 0847   06/12/20 1000  remdesivir 100 mg in sodium chloride 0.9 % 100 mL IVPB        100 mg 200 mL/hr over 30 Minutes Intravenous Daily 06/11/20 0435 06/13/20 0900   06/11/20 0445  remdesivir 200 mg in sodium chloride 0.9% 250 mL IVPB        200 mg 580 mL/hr over 30 Minutes Intravenous Once 06/11/20 0434 06/11/20 0929   06/11/20 0230  vancomycin (VANCOREADY) IVPB 1500 mg/300 mL        1,500 mg 150 mL/hr over 120 Minutes Intravenous  Once 06/11/20 0229 06/11/20 0812   06/11/20 0215  ampicillin (OMNIPEN) 2 g in  sodium chloride 0.9 % 100 mL IVPB        2 g 300 mL/hr over 20 Minutes Intravenous  Once 06/11/20 0157 06/11/20 0550   06/11/20 0215  vancomycin (VANCOREADY) IVPB 1000 mg/200 mL  Status:  Discontinued        1,000 mg 200 mL/hr over 60 Minutes Intravenous  Once 06/11/20 0157 06/11/20 0229   06/11/20 0200  cefTRIAXone (ROCEPHIN) 2 g in sodium chloride 0.9 % 100 mL IVPB         2 g 200 mL/hr over 30 Minutes Intravenous  Once 06/11/20 0157 06/11/20 0248      Inpatient Medications  Scheduled Meds: . amLODipine  10 mg Oral Daily  . aspirin EC  81 mg Oral Daily  . atorvastatin  40 mg Oral Daily  . carvedilol  6.25 mg Oral BID WC  . divalproex  125 mg Oral Q12H  . enoxaparin (LOVENOX) injection  40 mg Subcutaneous Q24H  . feeding supplement  237 mL Oral BID BM  . hydrALAZINE  50 mg Oral Q8H  . insulin aspart  0-9 Units Subcutaneous TID WC  . multivitamin with minerals  1 tablet Oral Daily  . pantoprazole  40 mg Oral Daily  . tamsulosin  0.4 mg Oral Daily  . thiamine  100 mg Oral Daily  . vitamin B-12  1,000 mcg Oral Daily   Continuous Infusions:  PRN Meds:.acetaminophen, haloperidol lactate, hydrALAZINE, [DISCONTINUED] ondansetron **OR** ondansetron (ZOFRAN) IV, QUEtiapine   Time Spent in minutes  25    See all Orders from today for further details   Lala Lund M.D on 06/25/2020 at 10:38 AM  To page go to [email or web address] - use universal password  [facility] -  Office  [phone]    Objective:   Vitals:   06/24/20 2014 06/25/20 0005 06/25/20 0418 06/25/20 0820  BP: 124/62 (!) 141/90 140/86   Pulse: 75 75 72   Resp: 16 18 18 18   Temp: 98.5 F (36.9 C) 98.5 F (36.9 C) 98.5 F (36.9 C) 98.8 F (37.1 C)  TempSrc: Oral Axillary Oral Axillary  SpO2: 98% 98% 90%   Weight:      Height:        Wt Readings from Last 3 Encounters:  06/10/20 86.2 kg  06/10/20 86.2 kg  04/19/20 86.2 kg     Intake/Output Summary (Last 24 hours) at 06/25/2020 1038 Last data filed at 06/25/2020 0100 Gross per 24 hour  Intake --  Output 500 ml  Net -500 ml     Physical Exam  Awake , less somnolent this morning, still quite confused, moves all 4 extremities by himself, no focal deficits apparent [place].AT,PERRAL Supple Neck,No JVD, No cervical lymphadenopathy appriciated.  Symmetrical Chest wall movement, Good air movement bilaterally,  CTAB RRR,No Gallops, Rubs or new Murmurs, No Parasternal Heave +ve B.Sounds, Abd Soft, No tenderness, No organomegaly appriciated, No rebound - guarding or rigidity. No Cyanosis, Clubbing or edema, No new Rash or bruise    Data Review:    CBC Recent Labs  Lab 06/20/20 0110 06/21/20 0141 06/22/20 0739 06/23/20 0608 06/25/20 0332  WBC 9.5 10.0 11.6* 10.2 12.1*  HGB 14.2 13.9 15.1 13.2 12.5*  HCT 43.0 42.2 46.3 40.5 38.4*  PLT 206 230 268 238 222  MCV 86.2 85.4 86.2 86.5 86.5  MCH 28.5 28.1 28.1 28.2 28.2  MCHC 33.0 32.9 32.6 32.6 32.6  RDW 13.5 13.6 13.9 14.1 13.7  LYMPHSABS 3.4 3.6 3.0  --  3.9  MONOABS  0.6 0.7 0.8  --  1.0  EOSABS 0.2 0.2 0.2  --  0.2  BASOSABS 0.0 0.0 0.0  --  0.0    Chemistries  Recent Labs  Lab 06/20/20 0110 06/21/20 0141 06/22/20 0739 06/23/20 0420 06/24/20 0725 06/25/20 0332  NA 138 140 140 140 138 140  K 4.6 4.2 4.1 3.7 4.0 3.5  CL 106 108 105 105 105 107  CO2 24 23 25 23 25 26   GLUCOSE 185* 134* 150* 132* 178* 129*  BUN 17 19 23  25* 23 19  CREATININE 1.02 1.11 1.08 1.12 1.10 1.10  CALCIUM 9.3 9.3 9.8 9.6 9.1 9.0  MG 2.1 2.1 2.2 2.2 2.1 2.2  AST 71* 40 34 32  --  31  ALT 98* 74* 63* 54*  --  43  ALKPHOS 53 52 62 58  --  54  BILITOT 0.9 0.7 0.8 1.2  --  0.8   ------------------------------------------------------------------------------------------------------------------ No results for input(s): CHOL, HDL, LDLCALC, TRIG, CHOLHDL, LDLDIRECT in the last 72 hours.  Lab Results  Component Value Date   HGBA1C 6.6 (H) 06/11/2020   ------------------------------------------------------------------------------------------------------------------ No results for input(s): TSH, T4TOTAL, T3FREE, THYROIDAB in the last 72 hours.  Invalid input(s): FREET3 ------------------------------------------------------------------------------------------------------------------ No results for input(s): VITAMINB12, FOLATE, FERRITIN, TIBC, IRON,  RETICCTPCT in the last 72 hours.  Coagulation profile No results for input(s): INR, PROTIME in the last 168 hours.  No results for input(s): DDIMER in the last 72 hours.  Cardiac Enzymes No results for input(s): CKMB, TROPONINI, MYOGLOBIN in the last 168 hours.  Invalid input(s): CK ------------------------------------------------------------------------------------------------------------------    Component Value Date/Time   BNP 81.6 06/25/2020 0332    Micro Results No results found for this or any previous visit (from the past 240 hour(s)).  Radiology Reports CT Head Wo Contrast  Result Date: 06/10/2020 CLINICAL DATA:  Head and C-spine injury, fall, altered mental status EXAM: CT HEAD WITHOUT CONTRAST CT CERVICAL SPINE WITHOUT CONTRAST TECHNIQUE: Multidetector CT imaging of the head and cervical spine was performed following the standard protocol without intravenous contrast. Multiplanar CT image reconstructions of the cervical spine were also generated. COMPARISON:  05/04/2020 FINDINGS: CT HEAD FINDINGS Brain: Stable mild brain atrophy pattern. No acute intracranial hemorrhage, new mass lesion, acute infarction, midline shift, herniation, hydrocephalus, or extra-axial fluid collection. No focal mass effect or edema. Cisterns are patent. Cerebellar atrophy as well. Vascular: Intracranial atherosclerosis at the skull base. No hyperdense vessel. Skull: Normal. Negative for fracture or focal lesion. Sinuses/Orbits: Minor scattered sinus mucosal thickening as before. Orbits are symmetric. No acute finding. Other: None. CT CERVICAL SPINE FINDINGS Alignment: Normal. Skull base and vertebrae: No acute fracture. No primary bone lesion or focal pathologic process. Soft tissues and spinal canal: Normal prevertebral soft tissues. No hemorrhage or hematoma appreciated. Carotid atherosclerosis. Disc levels: Similar mild multilevel degenerative disc disease and facet arthropathy. Facets are aligned. No  subluxation or dislocation. Preserved vertebral body heights. Upper chest: Negative. Other: None. IMPRESSION: Stable brain atrophy pattern. No acute intracranial abnormality or interval change by noncontrast CT. Stable mild cervical degenerative changes as above. No acute osseous finding or malalignment by CT. Electronically Signed   By: Jerilynn Mages.  Shick M.D.   On: 06/10/2020 12:31   CT Cervical Spine Wo Contrast  Result Date: 06/10/2020 CLINICAL DATA:  Head and C-spine injury, fall, altered mental status EXAM: CT HEAD WITHOUT CONTRAST CT CERVICAL SPINE WITHOUT CONTRAST TECHNIQUE: Multidetector CT imaging of the head and cervical spine was performed following the standard protocol without intravenous  contrast. Multiplanar CT image reconstructions of the cervical spine were also generated. COMPARISON:  05/04/2020 FINDINGS: CT HEAD FINDINGS Brain: Stable mild brain atrophy pattern. No acute intracranial hemorrhage, new mass lesion, acute infarction, midline shift, herniation, hydrocephalus, or extra-axial fluid collection. No focal mass effect or edema. Cisterns are patent. Cerebellar atrophy as well. Vascular: Intracranial atherosclerosis at the skull base. No hyperdense vessel. Skull: Normal. Negative for fracture or focal lesion. Sinuses/Orbits: Minor scattered sinus mucosal thickening as before. Orbits are symmetric. No acute finding. Other: None. CT CERVICAL SPINE FINDINGS Alignment: Normal. Skull base and vertebrae: No acute fracture. No primary bone lesion or focal pathologic process. Soft tissues and spinal canal: Normal prevertebral soft tissues. No hemorrhage or hematoma appreciated. Carotid atherosclerosis. Disc levels: Similar mild multilevel degenerative disc disease and facet arthropathy. Facets are aligned. No subluxation or dislocation. Preserved vertebral body heights. Upper chest: Negative. Other: None. IMPRESSION: Stable brain atrophy pattern. No acute intracranial abnormality or interval change by  noncontrast CT. Stable mild cervical degenerative changes as above. No acute osseous finding or malalignment by CT. Electronically Signed   By: Judie PetitM.  Shick M.D.   On: 06/10/2020 12:31   MR Brain Wo Contrast (neuro protocol)  Result Date: 06/10/2020 CLINICAL DATA:  Acute neuro deficit.  Mental status change. EXAM: MRI HEAD WITHOUT CONTRAST TECHNIQUE: Multiplanar, multiecho pulse sequences of the brain and surrounding structures were obtained without intravenous contrast. COMPARISON:  CT head 06/10/2020 FINDINGS: Brain: Ventricle size and cerebral volume normal for age. Negative for acute infarct. No significant chronic ischemic change. Negative for hemorrhage or mass. Image quality degraded by mild motion. Vascular: Partial loss of flow void in the left internal carotid artery through the skull base and the distal left vertebral artery which may be due to slow flow or stenosis. Skull and upper cervical spine: Negative Sinuses/Orbits: Mild mucosal edema paranasal sinuses. No orbital mass. Bilateral exophthalmos. Other: None IMPRESSION: Negative for acute infarct. Partial loss of flow void in the left internal carotid artery distal left vertebral artery which could be due to proximal stenosis. Consider CT angio head and neck for further evaluation. Electronically Signed   By: Marlan Palauharles  Clark M.D.   On: 06/10/2020 15:09   DG Chest Port 1 View  Result Date: 06/24/2020 CLINICAL DATA:  Shortness of breath. EXAM: PORTABLE CHEST 1 VIEW COMPARISON:  June 20, 2020. FINDINGS: The heart size and mediastinal contours are within normal limits. Both lungs are clear. The visualized skeletal structures are unremarkable. IMPRESSION: No active disease. Electronically Signed   By: Lupita RaiderJames  Green Jr M.D.   On: 06/24/2020 08:13   DG Chest Port 1 View  Result Date: 06/20/2020 CLINICAL DATA:  Shortness of breath EXAM: PORTABLE CHEST 1 VIEW COMPARISON:  06/11/2020 FINDINGS: Loop recorder is identified adjacent to the cardiac apex.  Normal heart size. No pleural effusion or edema. No airspace densities identified. No acute osseous findings. IMPRESSION: No acute cardiopulmonary abnormalities. Electronically Signed   By: Signa Kellaylor  Stroud M.D.   On: 06/20/2020 08:34   DG Chest Port 1 View  Result Date: 06/11/2020 CLINICAL DATA:  Questionable sepsis. EXAM: PORTABLE CHEST 1 VIEW COMPARISON:  June 10, 2020 FINDINGS: There is no evidence of acute infiltrate, pleural effusion or pneumothorax. The heart size and mediastinal contours are within normal limits. A radiopaque loop recorder device is seen. The visualized skeletal structures are unremarkable. IMPRESSION: No active disease. Electronically Signed   By: Aram Candelahaddeus  Houston M.D.   On: 06/11/2020 03:10   DG [facility]  Result Date: 06/10/2020 CLINICAL DATA:  Altered mental status. EXAM: PORTABLE CHEST 1 VIEW COMPARISON:  May 25, 2020 FINDINGS: Loop recorder in place. Cardiomediastinal silhouette is normal. Mediastinal contours appear intact. Tortuosity and calcific atherosclerotic disease of the aorta. There is no evidence of focal airspace consolidation, pleural effusion or pneumothorax. Osseous structures are without acute abnormality. Soft tissues are grossly normal. IMPRESSION: No active disease. Electronically Signed   By: Fidela Salisbury M.D.   On: 06/10/2020 11:15   EEG adult  Result Date: 06/12/2020 Lora Havens, MD     06/12/2020  3:07 PM Patient Name: Hervey Wedig MRN: [id number] Epilepsy Attending: Lora Havens Referring Physician/Provider: Dr Oren Binet Date: 06/12/2020 Duration: 25.42 mins Patient history: 80yo M with ams. EEG to evaluate for seizure Level of alertness: Awake AEDs during EEG study: None Technical aspects: This EEG study was done with scalp electrodes positioned according to the 10-20 International system of electrode placement. Electrical activity was acquired at a sampling rate of 500Hz  and reviewed with a high frequency filter of 70Hz   and a low frequency filter of 1Hz . EEG data were recorded continuously and digitally stored. Description: No posterior dominant rhythm was seen. EEG showed continuous generalized 5 to 6 Hz theta slowing. Hyperventilation and photic stimulation were not performed.   ABNORMALITY - Continuous slow, generalized IMPRESSION: This study is suggestive of mild to moderate diffuse encephalopathy, nonspecific etiology. No seizures or epileptiform discharges were seen throughout the recording. Priyanka Barbra Sarks   DG Hips Bilat W or Wo Pelvis 3-4 Views  Result Date: 06/10/2020 CLINICAL DATA:  Found on side of road, last seen normal at 2130 hours last night, fall, dementia, tenderness of hips bilaterally EXAM: DG HIP (WITH OR WITHOUT PELVIS) 3-4V BILAT COMPARISON:  None FINDINGS: Osseous demineralization. Hip and SI joint spaces preserved. No acute fracture, dislocation, or bone destruction. Mild degenerative disc disease changes at visualized lower lumbar spine. IMPRESSION: No acute osseous abnormalities. Electronically Signed   By: Lavonia Dana M.D.   On: 06/10/2020 11:48

## 2020-06-25 NOTE — TOC Progression Note (Addendum)
Transition of Care [facility]) - Progression Note    Patient Details  Name: Patrick Phillips MRN: [id number] Date of Birth: [birth year]  Transition of Care [facility] CM/SW [place] [place] Phone Number: 06/25/2020, 1:58 PM  Clinical Narrative:     CSW received call from pt's daughter [name]. CSW provided updated that he had begun seeking Geripsych placement but that pt was not yet medically stable. Pt is currently medically stable so TOC will continue seeking Geripsych placement.   Referrals refaxed to [facility], [facility], and [facility].   CSW received the following responses: [facility] is unable to accept pt due to his primary diagnosis being dementia [facility] intake [phone]) is reviewing pt and has not yet made a decision.  [facility]; no answer; and voicemail unavailable [phone]) [facility] does not accept dementia pts  1500: Additional referrals faxed to [facility], [facility] and [facility]    Expected Discharge Plan: Memory Care Barriers to Discharge: Continued Medical Work up  Expected Discharge Plan and Services Expected Discharge Plan: Memory Care In-house Referral: Clinical Social Work Discharge Planning Services: CM Consult Post Acute Care Choice: Nursing Home Living arrangements for the past 2 months: Single Family Home                                       Social Determinants of Health (SDOH) Interventions    Readmission Risk Interventions No flowsheet data found.

## 2020-06-25 NOTE — Progress Notes (Signed)
SLP Cancellation Note  Patient Details Name: Patrick Phillips MRN: [id number] DOB: 1940-08-13   Cancelled treatment:       Reason Eval/Treat Not Completed: Fatigue/lethargy limiting ability to participate. SLP was contacted by radiology tech and was advised that the pt was unable to have modified barium swallow study completed due to his being lethargic, and that he was unable to receive meds this morning for this reason. SLP will follow up at bedside later today as schedule allows.   Cynda Soule I. Hardin Negus, [place], [place] Office number [phone] Pager [place] 06/25/2020, 4:55 PM

## 2020-06-25 NOTE — Progress Notes (Signed)
Spoke with daughter, Patrick Phillips. I updated Patrick Phillips on patient's status. 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 We also discussed vitamin deficiencies, specifically B-12 (thiamine). Patrick Phillips stated that as far as she knew he was only vitamin D deficient. Patient currently taking thiamine and cyanocobalamin.   We discussed the patient's lab values/results that have been seen by [facility] and Patrick Phillips (the other daughter) on the patient's MyChart. I explained why we may not have prescribed an antibiotic to the patient solely based on his lab values. We discussed the benefits/ risks of broad spectrum antibiotic usage and I educated her about the risk of patients developing C. Diff with antibiotic use. I told her that currently his vital signs were WDL and that he was afebrile. We discussed the patient being in 2-point soft wrist restraints and the current necessity for restraint use. Patrick Phillips verbalized understanding.

## 2020-06-25 NOTE — Consult Note (Signed)
  Reason for Consult: ' Please follow daily, extremely drowsy on Psych Meds, despite Depakote dose being cut.'  Patient daily psych evaluations continued to be limited by his confusion and worsening cognitive impairment. No changes from yesterdays limited psych revaluation. Patient eyes were closed, sitter attempting to feed him breakfast in which he would open his mouth some. Patient continues to be disoriented, he is only alert to self only. He is asleep but arousable,yet confused and disoriented. He has been difficult to perform psychiatric evaluations on him, as his cognition continues to decline. Medication adjustments were made although he does not appear to be responding well with or without medications. It appears he has received 3 doses of Seroquel. Chart review indicates he was anxious and agitated yesterday prior to his MBS and making attempts to get out of bed.    Treatment Plan -Continue to Recommend 1:1 safety sitter as he remains confused, restless and agitated. Safety sitters offer supportive interaction with patient, and can easily identify patient needs, reorient, and address environmental factors that contribute to the delirium. If patient requires restraints, recommend a safety sitter also.  -  If patient remains agitated and trying to remove medical monitoring devices or IVs, may need to consider discontinuing hiding unnecessary devices to further decrease delirium and agitation.  Patient with documented diagnosis of dementia with rapid cognitive decline this admission, it should be recognized that the level of care he requires is outside his family and caregiver capacities, and he will likely benefit from structured facility.   -Continue recommendations with physical therapy, early mobilization does help with cognitive stimulation and improves delirium.  -Will continue Seroquel 25mg  po BID prn for dementia with behavioral disturbances, and psychosis.Last EKG ordered on 04/14 QTc of  450, EKG ordered 04/27 has not been obtained.   -Depakote level (0.39 on 04/26), Continue current dose of Depakote 125mg  po BID.   -Recommend geropsych inpatient once medically stable. Patient does not appear to be responding to traditional pharmacological treatment modalities.  -Recommend TOC consult for inpatient Geropsych referral.

## 2020-06-26 DIAGNOSIS — Z515 Encounter for palliative care: Secondary | ICD-10-CM | POA: Diagnosis not present

## 2020-06-26 DIAGNOSIS — Z66 Do not resuscitate: Secondary | ICD-10-CM | POA: Diagnosis not present

## 2020-06-26 DIAGNOSIS — Z7189 Other specified counseling: Secondary | ICD-10-CM | POA: Diagnosis not present

## 2020-06-26 LAB — CBC WITH DIFFERENTIAL/PLATELET
Abs Immature Granulocytes: 0.04 10*3/uL (ref 0.00–0.07)
Basophils Absolute: 0 10*3/uL (ref 0.0–0.1)
Basophils Relative: 0 %
Eosinophils Absolute: 0.2 10*3/uL (ref 0.0–0.5)
Eosinophils Relative: 2 %
HCT: 36.2 % — ABNORMAL LOW (ref 39.0–52.0)
Hemoglobin: 11.9 g/dL — ABNORMAL LOW (ref 13.0–17.0)
Immature Granulocytes: 1 %
Lymphocytes Relative: 36 %
Lymphs Abs: 3.2 10*3/uL (ref 0.7–4.0)
MCH: 28.4 pg (ref 26.0–34.0)
MCHC: 32.9 g/dL (ref 30.0–36.0)
MCV: 86.4 fL (ref 80.0–100.0)
Monocytes Absolute: 0.7 10*3/uL (ref 0.1–1.0)
Monocytes Relative: 9 %
Neutro Abs: 4.6 10*3/uL (ref 1.7–7.7)
Neutrophils Relative %: 52 %
Platelets: 226 10*3/uL (ref 150–400)
RBC: 4.19 MIL/uL — ABNORMAL LOW (ref 4.22–5.81)
RDW: 13.8 % (ref 11.5–15.5)
WBC: 8.8 10*3/uL (ref 4.0–10.5)
nRBC: 0 % (ref 0.0–0.2)

## 2020-06-26 LAB — COMPREHENSIVE METABOLIC PANEL
ALT: 37 U/L (ref 0–44)
AST: 27 U/L (ref 15–41)
Albumin: 2.8 g/dL — ABNORMAL LOW (ref 3.5–5.0)
Alkaline Phosphatase: 48 U/L (ref 38–126)
Anion gap: 8 (ref 5–15)
BUN: 18 mg/dL (ref 8–23)
CO2: 24 mmol/L (ref 22–32)
Calcium: 8.8 mg/dL — ABNORMAL LOW (ref 8.9–10.3)
Chloride: 107 mmol/L (ref 98–111)
Creatinine, Ser: 1.03 mg/dL (ref 0.61–1.24)
GFR, Estimated: >60 mL/min (ref >60)
Glucose, Bld: 147 mg/dL — ABNORMAL HIGH (ref 70–99)
Potassium: 3.5 mmol/L (ref 3.5–5.1)
Sodium: 139 mmol/L (ref 135–145)
Total Bilirubin: 0.6 mg/dL (ref 0.3–1.2)
Total Protein: 5.1 g/dL — ABNORMAL LOW (ref 6.5–8.1)

## 2020-06-26 LAB — GLUCOSE, CAPILLARY
Glucose-Capillary: 164 mg/dL — ABNORMAL HIGH (ref 70–99)
Glucose-Capillary: 232 mg/dL — ABNORMAL HIGH (ref 70–99)
Glucose-Capillary: 155 mg/dL — ABNORMAL HIGH (ref 70–99)
Glucose-Capillary: 123 mg/dL — ABNORMAL HIGH (ref 70–99)

## 2020-06-26 LAB — MAGNESIUM: Magnesium: 2.1 mg/dL (ref 1.7–2.4)

## 2020-06-26 LAB — C-REACTIVE PROTEIN: CRP: 0.8 mg/dL (ref <1.0)

## 2020-06-26 LAB — BRAIN NATRIURETIC PEPTIDE: B Natriuretic Peptide: 92.6 pg/mL (ref 0.0–100.0)

## 2020-06-26 MED ORDER — POTASSIUM CHLORIDE 10 MEQ/100ML IV SOLN
10.0000 meq | INTRAVENOUS | Status: AC
  Administered 2020-06-26 (×3): 10 meq via INTRAVENOUS
  Filled 2020-06-26 (×3): qty 100

## 2020-06-26 MED ORDER — QUETIAPINE FUMARATE 25 MG PO TABS
25.0000 mg | ORAL_TABLET | Freq: Every day | ORAL | Status: DC
  Administered 2020-06-26 – 2020-06-29 (×4): 25 mg via ORAL
  Filled 2020-06-26 (×4): qty 1

## 2020-06-26 NOTE — Consult Note (Signed)
Palliative Medicine Inpatient Consult Note  Reason for consult:  "severe delirium, demented, poor Po intake, no improvement in weeks. GOC"  HPI:  Per intake H&P --> Patrick [place] is a 80 y.o. male with medical history significant of vascular dementia, DM2, HTN. Admitted on 4/15 for altered mental status. Has had a complicated delirium since admission with e/o combativeness.  Palliative care has been asked to get involved in the setting of little improvement since admission.   Clinical Assessment/Goals of Care:  *Please note that this is a verbal dictation therefore any spelling or grammatical errors are due to the "[place] [employer]" system interpretation.  I have reviewed medical records including EPIC notes, labs and imaging, received report from bedside RN, assessed the patient who was lying in bed pleasantly confused.    I called patients daughter, [place] to further discuss diagnosis prognosis, GOC, EOL wishes, disposition and options.   I introduced Palliative Medicine as specialized medical care for people living with serious illness. It focuses on providing relief from the symptoms and stress of a serious illness. The goal is to improve quality of life for both the patient and the family.  [place] shares with me that her father, [place] is from [place], [place].  He has lived in [place] throughout his whole life.  He is a widower.  He has had 5 Phillips 2 of whom have passed away.  He used to work at a [employer] and in [employer].  He is identified as a stubborn man.  He has a great love for his Fransisco Beau terrier, [place].   Prior to admission, Patrick Phillips was living independently.  His daughter, [place] would help very frequently with his Aleda Grana such as grocery shopping, meal preparation, and paying of bills.  Spenser had been mobilizing without a walker and had been walking his dog [place] regularly.  He was very functional prior to admission.  Brief review of Javon's comorbid  conditions was had.  We reviewed that he has suffered from A. fib for a number of years which contributed to a prior infarction. We reviewed his history of vascular dementia and the progressive nature of this disease.  We then discussed Lowne's delirium and how dementia contributes to complicated delirium in patients such as him as does COVID infection.  Further discussed that delirium can increase mortality and overall worsening clinical picture(s).  A detailed discussion was had today regarding advanced directives -there are none on file though with [place] being the eldest child she has been the Media planner.    Concepts specific to code status, artifical feeding and hydration, continued IV antibiotics and rehospitalization was had.  Reviewed that Orie Fisherman is a DO NOT RESUSCITATE DO NOT INTUBATE CODE STATUS, per past conversations he would never want a feeding tube for artificial nutrition.   The difference between a aggressive medical intervention path  and a palliative comfort care path for this patient at this time was had.  The goals for this point time are to improve Izael's mental condition ideally by him going to a geriatric psychiatry facility and then he will require long-term placement and less perchance his mental state clears.  At this point in time Patrick [place] is not able to provide the necessary care for [place] as she too has her own health ailments and recently gained custody of her 2 grandchildren ages 39 months and 12 years old.  Given the amount of stress [place] is under took the time to provide emotional support through therapeutic  listening.  Discussed the importance of continued conversation with family and their  medical providers regarding overall plan of care and treatment options, ensuring decisions are within the context of the patients values and GOCs.  Decision Maker: [place] (daughter) [phone]  SUMMARY OF RECOMMENDATIONS   DNAR/DNI  TOC - SW to search for OP  [facility]-Psychiatry bed  Patient will require long term placement thereafter if continues to have complicated delirium  PO intake appears to be improving as of today, will carefully watch this  Will provide recommendation for outpatient palliative ongoing support  Ongoing PMT support and conversations   Code Status/Advance Care Planning: DNAR/DNI   Symptom Management:  Delirium Management: - Appreciate psychiatry involvement for medication management - Delirium precautions:  -Lights and TV off, minimize interruptions at night -Blinds open and lights on during day -Glasses/hearing aid with patient - Get up during the day - Encourage a familiar face to remain present throughout the day -Frequent reorientation -PT/OT when able   Palliative Prophylaxis:   Care, mobility, pain control, delirium precautions  Additional Recommendations (Limitations, Scope, Preferences):  Continue to treat what is treatable  Psycho-social/Spiritual:   Desire for further Chaplaincy support:  No  Additional Recommendations:  Education on delirium progression   Prognosis:  Prognosis is unclear given patient's highly functional state prior to admission.  Discharge Planning:  Discharge would most likely be to [facility] if a bed becomes available  Vitals:   06/26/20 0315 06/26/20 0749  BP: 120/73 (!) 147/76  Pulse: 70 66  Resp:  20  Temp: 98 F (36.7 C) 97.8 F (36.6 C)  SpO2: 97% 97%    Intake/Output Summary (Last 24 hours) at 06/26/2020 1112 Last data filed at 06/26/2020 0900 Gross per 24 hour  Intake 1373.7 ml  Output 500 ml  Net 873.7 ml   Last Weight  Most recent update: 06/10/2020 11:13 PM   Weight  86.2 kg (190 lb 0.6 oz)           Gen:  Elderly appearing M in NAD HEENT: moist mucous membranes CV: Regular rate and irregular rhythm  PULM: On RA ABD: soft/nontender  EXT: No edema  Neuro: Alert and oriented x1-2  PPS: 30%   This conversation/these recommendations  were discussed with patient primary care team, Dr. Candiss Norse  Time In: 1115 Time Out: 1245 Total Time: 90 Greater than 50%  of this time was spent counseling and coordinating care related to the above assessment and plan.  [facility] Team Team Cell Phone: [phone] Please utilize secure chat with additional questions, if there is no response within 30 minutes please call the above phone number  Palliative Medicine Team providers are available by phone from 7am to 7pm daily and can be reached through the team cell phone.  Should this patient require assistance outside of these hours, please call the patient's attending physician.

## 2020-06-26 NOTE — Plan of Care (Signed)
  Problem: Safety: Goal: Non-violent Restraint(s) Outcome: Not Progressing   Problem: Education: Goal: Knowledge of General Education information will improve Description: Including pain rating scale, medication(s)/side effects and non-pharmacologic comfort measures Outcome: Not Progressing   Problem: Health Behavior/Discharge Planning: Goal: Ability to manage health-related needs will improve Outcome: Not Progressing   Problem: Clinical Measurements: Goal: Ability to maintain clinical measurements within normal limits will improve Outcome: Progressing Goal: Will remain free from infection Outcome: Progressing Goal: Diagnostic test results will improve Outcome: Not Progressing Goal: Respiratory complications will improve Outcome: Progressing Goal: Cardiovascular complication will be avoided Outcome: Progressing   Problem: Activity: Goal: Risk for activity intolerance will decrease Outcome: Progressing   Problem: Nutrition: Goal: Adequate nutrition will be maintained Outcome: Progressing   Problem: Coping: Goal: Level of anxiety will decrease Outcome: Not Progressing   Problem: Elimination: Goal: Will not experience complications related to bowel motility Outcome: Progressing Goal: Will not experience complications related to urinary retention Outcome: Progressing   Problem: Pain Managment: Goal: General experience of comfort will improve Outcome: Progressing   Problem: Safety: Goal: Ability to remain free from injury will improve Outcome: Progressing   Problem: Skin Integrity: Goal: Risk for impaired skin integrity will decrease Outcome: Not Progressing   Problem: Education: Goal: Knowledge of risk factors and measures for prevention of condition will improve Outcome: Not Progressing   Problem: Coping: Goal: Psychosocial and spiritual needs will be supported Outcome: Progressing   Problem: Respiratory: Goal: Will maintain a patent airway Outcome:  Progressing Goal: Complications related to the disease process, condition or treatment will be avoided or minimized Outcome: Progressing

## 2020-06-26 NOTE — Consult Note (Addendum)
  Reason for Consult: '' Please follow daily, extremely drowsy on Psych Meds, despite Depakote dose being cut.'   Patient is a 80 y.o. male with PMHx of vascular dementia, prior CVA, DM-2, HTN-who was admitted to the hospital with fever and confusion. He is seen face to face  in his room for psychiatric follow up care. Today, he is alert, awake but still appears confused and disoriented to place, person and time. However, he seems to be showing a more favorable response to low dose Seroquel and Depakote. Chart review does not show any significant aggression and agitation in the last 12 hours but patient condition is known to wax and wane.    Diagnosis: Demential with behavioral disturbances.   Treatment Plan -Continue to Recommend 1:1 safety sitter. Safety sitters offer supportive interaction with patient, and can easily identify patient needs, reorient, and address environmental factors that contribute to the delirium. If patient requires restraints, recommend a safety sitter also.  -  If patient remains agitated and trying to remove medical monitoring devices or IVs, may need to consider discontinuing hiding unnecessary devices to further decrease delirium and agitation.  -Patient with documented diagnosis of dementia with rapid cognitive decline this admission, it should be recognized that the level of care he requires is outside his family and caregiver capacities, and he will likely benefit from structured facility.   -Continue recommendations with physical therapy, early mobilization does help with cognitive stimulation and improves delirium.  -Will continue Seroquel 25mg  po at bedtime for aggression/behavioral disturbances. -Will continue Depakote Sprinkle 125 mg twice daily for aggression -Please monitor QTc level as deemed necessary.  -Recommend geropsych inpatient once medically stable. Patient does not appear to be responding to traditional pharmacological treatment modalities.   -Recommend TOC consult for inpatient Geropsych referral.   Corena Pilgrim, MD Attending psychiatrist

## 2020-06-26 NOTE — Progress Notes (Signed)
PROGRESS NOTE                                                                                                                                                                                                             Patient Demographics:    Patrick Phillips, is a 80 y.o. male, DOB - June 10, 1940, SFK:[id number]  Outpatient Primary MD for the patient is [name], MD   Admit date - 06/10/2020   LOS - 18  Chief Complaint  Patient presents with  . Altered Mental Status       Brief Narrative: Patient is a 80 y.o. male with PMHx of vascular dementia, prior CVA, DM-2, HTN-who presented with fever and confusion.  COVID-19 vaccinated status:  Unknown  Significant Events:  4/14>> Admit to [facility] for fever/confusion  Significant studies:  4/14>>Chest x-ray: No PNA 4/14>> CT head: No acute intracranial abnormality. 4/14>> CT C-spine: No fracture 4/14>> MRI brain: No acute infarct. 4/15>> chest x-ray: No PNA 4/15>> CSF: WBC 1 (tube #4), protein 46, glucose 54 4/16>> EEG: Mild to moderate diffuse encephalopathy-no seizures.  COVID-19 medications:  Remdesivir: 4/15>>  Antibiotics:  Vancomycin: 4/14 x1 Ceftriaxone: 4/14 x 1 Ampicillin: 4/14 x 1  Microbiology data:  4/14 >>blood culture: No growth 4/14>> urine culture: No growth 4/15>> blood culture: No growth 4/15>> urine culture: Multiple species 4/15>> CSF culture: No growth 4/15>> CSF PCR HSV 1/2: Negative. 4/15>> CSF HSV culture: Negative 4/15>> CSF arbovirus IgG: Negative 4/15>> CSF VDRL: Negative RMSF CSF serology IgM -ve  Procedures:  4/15>> lumbar puncture in the emergency room  Consults:  Neurology, psychiatry  DVT prophylaxis:  enoxaparin (LOVENOX) injection 40 mg Start: 06/11/20 0600    Subjective:   Patient in bed, appears comfortable, denies any headache, no fever, no chest pain or pressure, no shortness of breath , no abdominal pain.  No new focal weakness.    Assessment  & Plan :   Acute metabolic encephalopathy-superimposed on underlying Dementia with delirium: Encephalopathy on presentation was likely related to febrile illness/COVID-19 infection-all culture data negative so far.  CT head, MRI brain and EEG unremarkable. He has had no fever since admission-his  encephalopathy I suspect his confusion/agitation now is related to delirium associated with dementia. Neuro and Psych consulted, Psych requested by me to follow daily.  Evaluated by psychiatry and was started on Saphris and Depakote-  Saphris 5 twice daily but it was stopped on 06/23/20 due to somnolence, have reduced Depakote dose due to sedation also was started on Seroquel on 06/24/20 - dose reduced on 06/25/2020 with excellent results, continue on the lower dose Seroquel.   ? Tick Bite: 2 ticks found by nursing staff on patient 06/17/20 - RMSF IgM - ve, DW ID Dr Tommy Medal 06/23/20 - not acute, no Rx.   AKI: Mild-likely hemodynamically mediated-resolved with IV fluids.  Borderline vitamin B12 deficiency: On supplementation.  Hypokalemia: Repleted-recheck periodically.  HTN: Currently on Coreg, Norvasc, added hydralazine for better control.  History of CVA-MRI brain negative for infarct-on aspirin/statin.  Dementia: On Namenda  GERD: On PPI  SIRS-COVID-19 infection: Completed Remdesivir x3 days.  Not hypoxic-no pneumonia-does not require any further therapy.  Interestingly-patient apparently had COVID-19 infection in February as well.  Has finished his COVID isolation on 06/21/2020  Recent Labs    06/25/20 0332 06/26/20 0444  CRP 0.6 0.8    Lab Results  Component Value Date   SARSCOV2NAA POSITIVE (A) 06/11/2020   [place] NEGATIVE 01/13/2020   DM-2: CBGs relatively stable-continue SSI-oral hypoglycemic agents on hold  Recent Labs    06/25/20 1629 06/25/20 2036 06/26/20 0748  GLUCAP 111* 200* 164*     Condition -   Guarded  Family  Communication  :   Daughter [phone]-updated over the phone on 4/22   Daughter- Brenda-[phone] at bedside on 4/21, 06/19/20, 06/21/20, 06/23/20, 06/25/2018   Code Status :  Full Code  Diet :  Diet Order            Diet regular Room service appropriate? No; Fluid consistency: Thin  Diet effective now                  Disposition Plan  :   Status is: Inpatient  Remains inpatient appropriate because:Inpatient level of care appropriate due to severity of illness   Dispo: The patient is from: Home              Anticipated d/c is to: Home              Patient currently is not medically stable to d/c.   Difficult to place patient No   Barriers to discharge: Resolving encephalopathy-awaiting final culture data.  Antimicorbials  :    Anti-infectives (From admission, onward)   Start     Dose/Rate Route Frequency Ordered Stop   06/18/20 1745  doxycycline (VIBRAMYCIN) 100 mg in sodium chloride 0.9 % 250 mL IVPB  Status:  Discontinued        100 mg 125 mL/hr over 120 Minutes Intravenous Every 12 hours 06/18/20 1648 06/23/20 0847   06/12/20 1000  remdesivir 100 mg in sodium chloride 0.9 % 100 mL IVPB        100 mg 200 mL/hr over 30 Minutes Intravenous Daily 06/11/20 0435 06/13/20 0900   06/11/20 0445  remdesivir 200 mg in sodium chloride 0.9% 250 mL IVPB        200 mg 580 mL/hr over 30 Minutes Intravenous Once 06/11/20 0434 06/11/20 0929   06/11/20 0230  vancomycin (VANCOREADY) IVPB 1500 mg/300 mL        1,500 mg 150 mL/hr over 120 Minutes Intravenous  Once 06/11/20 0229 06/11/20 1191  06/11/20 0215  ampicillin (OMNIPEN) 2 g in sodium chloride 0.9 % 100 mL IVPB        2 g 300 mL/hr over 20 Minutes Intravenous  Once 06/11/20 0157 06/11/20 0550   06/11/20 0215  vancomycin (VANCOREADY) IVPB 1000 mg/200 mL  Status:  Discontinued        1,000 mg 200 mL/hr over 60 Minutes Intravenous  Once 06/11/20 0157 06/11/20 0229   06/11/20 0200  cefTRIAXone (ROCEPHIN) 2 g in sodium  chloride 0.9 % 100 mL IVPB        2 g 200 mL/hr over 30 Minutes Intravenous  Once 06/11/20 0157 06/11/20 0248      Inpatient Medications  Scheduled Meds: . amLODipine  10 mg Oral Daily  . aspirin EC  81 mg Oral Daily  . atorvastatin  40 mg Oral Daily  . carvedilol  6.25 mg Oral BID WC  . divalproex  125 mg Oral Q12H  . enoxaparin (LOVENOX) injection  40 mg Subcutaneous Q24H  . feeding supplement  237 mL Oral BID BM  . hydrALAZINE  50 mg Oral Q8H  . insulin aspart  0-9 Units Subcutaneous TID WC  . multivitamin with minerals  1 tablet Oral Daily  . naLOXone (NARCAN)  injection  0.4 mg Intravenous Once  . pantoprazole  40 mg Oral Daily  . QUEtiapine  25 mg Oral QHS  . tamsulosin  0.4 mg Oral Daily  . thiamine  100 mg Oral Daily  . vitamin B-12  1,000 mcg Oral Daily   Continuous Infusions: . potassium chloride 10 mEq (06/26/20 0928)   PRN Meds:.acetaminophen, haloperidol lactate, hydrALAZINE, [DISCONTINUED] ondansetron **OR** ondansetron (ZOFRAN) IV   Time Spent in minutes  25    See all Orders from today for further details   Lala Lund M.D on 06/26/2020 at 10:28 AM  To page go to [email or web address] - use universal password  [facility] -  Office  [phone]    Objective:   Vitals:   06/25/20 2036 06/25/20 2310 06/26/20 0315 06/26/20 0749  BP: 138/76 137/90 120/73 (!) 147/76  Pulse: 80 75 70 66  Resp:    20  Temp: 98.1 F (36.7 C) 98 F (36.7 C) 98 F (36.7 C) 97.8 F (36.6 C)  TempSrc: Oral Oral Axillary Oral  SpO2: 97% 97% 97% 97%  Weight:      Height:        Wt Readings from Last 3 Encounters:  06/10/20 86.2 kg  06/10/20 86.2 kg  04/19/20 86.2 kg     Intake/Output Summary (Last 24 hours) at 06/26/2020 1028 Last data filed at 06/26/2020 0900 Gross per 24 hour  Intake 1373.7 ml  Output 500 ml  Net 873.7 ml     Physical Exam  Awake more Alert, mildly confused, No new F.N deficits,   [place].AT,PERRAL Supple Neck,No JVD, No cervical  lymphadenopathy appriciated.  Symmetrical Chest wall movement, Good air movement bilaterally, CTAB RRR,No Gallops, Rubs or new Murmurs, No Parasternal Heave +ve B.Sounds, Abd Soft, No tenderness, No organomegaly appriciated, No rebound - guarding or rigidity. No Cyanosis, Clubbing or edema, No new Rash or bruise     Data Review:    CBC Recent Labs  Lab 06/20/20 0110 06/21/20 0141 06/22/20 0739 06/23/20 0608 06/25/20 0332 06/26/20 0444  WBC 9.5 10.0 11.6* 10.2 12.1* 8.8  HGB 14.2 13.9 15.1 13.2 12.5* 11.9*  HCT 43.0 42.2 46.3 40.5 38.4* 36.2*  PLT 206 230 268 238 222 226  MCV 86.2 85.4 86.2  86.5 86.5 86.4  MCH 28.5 28.1 28.1 28.2 28.2 28.4  MCHC 33.0 32.9 32.6 32.6 32.6 32.9  RDW 13.5 13.6 13.9 14.1 13.7 13.8  LYMPHSABS 3.4 3.6 3.0  --  3.9 3.2  MONOABS 0.6 0.7 0.8  --  1.0 0.7  EOSABS 0.2 0.2 0.2  --  0.2 0.2  BASOSABS 0.0 0.0 0.0  --  0.0 0.0    Chemistries  Recent Labs  Lab 06/21/20 0141 06/22/20 0739 06/23/20 0420 06/24/20 0725 06/25/20 0332 06/26/20 0444  NA 140 140 140 138 140 139  K 4.2 4.1 3.7 4.0 3.5 3.5  CL 108 105 105 105 107 107  CO2 23 25 23 25 26 24   GLUCOSE 134* 150* 132* 178* 129* 147*  BUN 19 23 25* 23 19 18   CREATININE 1.11 1.08 1.12 1.10 1.10 1.03  CALCIUM 9.3 9.8 9.6 9.1 9.0 8.8*  MG 2.1 2.2 2.2 2.1 2.2 2.1  AST 40 34 32  --  31 27  ALT 74* 63* 54*  --  43 37  ALKPHOS 52 62 58  --  54 48  BILITOT 0.7 0.8 1.2  --  0.8 0.6   ------------------------------------------------------------------------------------------------------------------ No results for input(s): CHOL, HDL, LDLCALC, TRIG, CHOLHDL, LDLDIRECT in the last 72 hours.  Lab Results  Component Value Date   HGBA1C 6.6 (H) 06/11/2020   ------------------------------------------------------------------------------------------------------------------ No results for input(s): TSH, T4TOTAL, T3FREE, THYROIDAB in the last 72 hours.  Invalid input(s):  FREET3 ------------------------------------------------------------------------------------------------------------------ No results for input(s): VITAMINB12, FOLATE, FERRITIN, TIBC, IRON, RETICCTPCT in the last 72 hours.  Coagulation profile No results for input(s): INR, PROTIME in the last 168 hours.  No results for input(s): DDIMER in the last 72 hours.  Cardiac Enzymes No results for input(s): CKMB, TROPONINI, MYOGLOBIN in the last 168 hours.  Invalid input(s): CK ------------------------------------------------------------------------------------------------------------------    Component Value Date/Time   BNP 92.6 06/26/2020 0444    Micro Results No results found for this or any previous visit (from the past 240 hour(s)).  Radiology Reports CT Head Wo Contrast  Result Date: 06/10/2020 CLINICAL DATA:  Head and C-spine injury, fall, altered mental status EXAM: CT HEAD WITHOUT CONTRAST CT CERVICAL SPINE WITHOUT CONTRAST TECHNIQUE: Multidetector CT imaging of the head and cervical spine was performed following the standard protocol without intravenous contrast. Multiplanar CT image reconstructions of the cervical spine were also generated. COMPARISON:  05/04/2020 FINDINGS: CT HEAD FINDINGS Brain: Stable mild brain atrophy pattern. No acute intracranial hemorrhage, new mass lesion, acute infarction, midline shift, herniation, hydrocephalus, or extra-axial fluid collection. No focal mass effect or edema. Cisterns are patent. Cerebellar atrophy as well. Vascular: Intracranial atherosclerosis at the skull base. No hyperdense vessel. Skull: Normal. Negative for fracture or focal lesion. Sinuses/Orbits: Minor scattered sinus mucosal thickening as before. Orbits are symmetric. No acute finding. Other: None. CT CERVICAL SPINE FINDINGS Alignment: Normal. Skull base and vertebrae: No acute fracture. No primary bone lesion or focal pathologic process. Soft tissues and spinal canal: Normal  prevertebral soft tissues. No hemorrhage or hematoma appreciated. Carotid atherosclerosis. Disc levels: Similar mild multilevel degenerative disc disease and facet arthropathy. Facets are aligned. No subluxation or dislocation. Preserved vertebral body heights. Upper chest: Negative. Other: None. IMPRESSION: Stable brain atrophy pattern. No acute intracranial abnormality or interval change by noncontrast CT. Stable mild cervical degenerative changes as above. No acute osseous finding or malalignment by CT. Electronically Signed   By: Jerilynn Mages.  Shick M.D.   On: 06/10/2020 12:31   CT Cervical Spine Wo Contrast  Result Date: 06/10/2020 CLINICAL DATA:  Head and C-spine injury, fall, altered mental status EXAM: CT HEAD WITHOUT CONTRAST CT CERVICAL SPINE WITHOUT CONTRAST TECHNIQUE: Multidetector CT imaging of the head and cervical spine was performed following the standard protocol without intravenous contrast. Multiplanar CT image reconstructions of the cervical spine were also generated. COMPARISON:  05/04/2020 FINDINGS: CT HEAD FINDINGS Brain: Stable mild brain atrophy pattern. No acute intracranial hemorrhage, new mass lesion, acute infarction, midline shift, herniation, hydrocephalus, or extra-axial fluid collection. No focal mass effect or edema. Cisterns are patent. Cerebellar atrophy as well. Vascular: Intracranial atherosclerosis at the skull base. No hyperdense vessel. Skull: Normal. Negative for fracture or focal lesion. Sinuses/Orbits: Minor scattered sinus mucosal thickening as before. Orbits are symmetric. No acute finding. Other: None. CT CERVICAL SPINE FINDINGS Alignment: Normal. Skull base and vertebrae: No acute fracture. No primary bone lesion or focal pathologic process. Soft tissues and spinal canal: Normal prevertebral soft tissues. No hemorrhage or hematoma appreciated. Carotid atherosclerosis. Disc levels: Similar mild multilevel degenerative disc disease and facet arthropathy. Facets are aligned. No  subluxation or dislocation. Preserved vertebral body heights. Upper chest: Negative. Other: None. IMPRESSION: Stable brain atrophy pattern. No acute intracranial abnormality or interval change by noncontrast CT. Stable mild cervical degenerative changes as above. No acute osseous finding or malalignment by CT. Electronically Signed   By: Jerilynn Mages.  Shick M.D.   On: 06/10/2020 12:31   MR Brain Wo Contrast (neuro protocol)  Result Date: 06/10/2020 CLINICAL DATA:  Acute neuro deficit.  Mental status change. EXAM: MRI HEAD WITHOUT CONTRAST TECHNIQUE: Multiplanar, multiecho pulse sequences of the brain and surrounding structures were obtained without intravenous contrast. COMPARISON:  CT head 06/10/2020 FINDINGS: Brain: Ventricle size and cerebral volume normal for age. Negative for acute infarct. No significant chronic ischemic change. Negative for hemorrhage or mass. Image quality degraded by mild motion. Vascular: Partial loss of flow void in the left internal carotid artery through the skull base and the distal left vertebral artery which may be due to slow flow or stenosis. Skull and upper cervical spine: Negative Sinuses/Orbits: Mild mucosal edema paranasal sinuses. No orbital mass. Bilateral exophthalmos. Other: None IMPRESSION: Negative for acute infarct. Partial loss of flow void in the left internal carotid artery distal left vertebral artery which could be due to proximal stenosis. Consider CT angio head and neck for further evaluation. Electronically Signed   By: Franchot Gallo M.D.   On: 06/10/2020 15:09   DG Chest Port 1 View  Result Date: 06/25/2020 CLINICAL DATA:  Shortness of breath and lethargy. EXAM: PORTABLE CHEST 1 VIEW COMPARISON:  Chest x-ray 06/24/2020 FINDINGS: The cardiac silhouette, mediastinal and hilar contours are within limits. Stable mild tortuosity and calcification the thoracic aorta. The lungs are clear of an acute process. No pleural effusions or pulmonary lesions. The bony thorax is  intact. IMPRESSION: No acute cardiopulmonary findings. Electronically Signed   By: Marijo Sanes M.D.   On: 06/25/2020 14:15   DG Chest Port 1 View  Result Date: 06/24/2020 CLINICAL DATA:  Shortness of breath. EXAM: PORTABLE CHEST 1 VIEW COMPARISON:  June 20, 2020. FINDINGS: The heart size and mediastinal contours are within normal limits. Both lungs are clear. The visualized skeletal structures are unremarkable. IMPRESSION: No active disease. Electronically Signed   By: Marijo Conception M.D.   On: 06/24/2020 08:13   DG Chest Port 1 View  Result Date: 06/20/2020 CLINICAL DATA:  Shortness of breath EXAM: PORTABLE CHEST 1 VIEW COMPARISON:  06/11/2020 FINDINGS: Loop recorder  is identified adjacent to the cardiac apex. Normal heart size. No pleural effusion or edema. No airspace densities identified. No acute osseous findings. IMPRESSION: No acute cardiopulmonary abnormalities. Electronically Signed   By: Kerby Moors M.D.   On: 06/20/2020 08:34   DG Chest Port 1 View  Result Date: 06/11/2020 CLINICAL DATA:  Questionable sepsis. EXAM: PORTABLE CHEST 1 VIEW COMPARISON:  June 10, 2020 FINDINGS: There is no evidence of acute infiltrate, pleural effusion or pneumothorax. The heart size and mediastinal contours are within normal limits. A radiopaque loop recorder device is seen. The visualized skeletal structures are unremarkable. IMPRESSION: No active disease. Electronically Signed   By: Virgina Norfolk M.D.   On: 06/11/2020 03:10   DG Chest Port 1 View  Result Date: 06/10/2020 CLINICAL DATA:  Altered mental status. EXAM: PORTABLE CHEST 1 VIEW COMPARISON:  May 25, 2020 FINDINGS: Loop recorder in place. Cardiomediastinal silhouette is normal. Mediastinal contours appear intact. Tortuosity and calcific atherosclerotic disease of the aorta. There is no evidence of focal airspace consolidation, pleural effusion or pneumothorax. Osseous structures are without acute abnormality. Soft tissues are grossly  normal. IMPRESSION: No active disease. Electronically Signed   By: Fidela Salisbury M.D.   On: 06/10/2020 11:15   EEG adult  Result Date: 06/12/2020 Lora Havens, MD     06/12/2020  3:07 PM Patient Name: Shi Bayerl MRN: [id number] Epilepsy Attending: Lora Havens Referring Physician/Provider: Dr Oren Binet Date: 06/12/2020 Duration: 25.42 mins Patient history: 80yo M with ams. EEG to evaluate for seizure Level of alertness: Awake AEDs during EEG study: None Technical aspects: This EEG study was done with scalp electrodes positioned according to the 10-20 International system of electrode placement. Electrical activity was acquired at a sampling rate of 500Hz  and reviewed with a high frequency filter of 70Hz  and a low frequency filter of 1Hz . EEG data were recorded continuously and digitally stored. Description: No posterior dominant rhythm was seen. EEG showed continuous generalized 5 to 6 Hz theta slowing. Hyperventilation and photic stimulation were not performed.   ABNORMALITY - Continuous slow, generalized IMPRESSION: This study is suggestive of mild to moderate diffuse encephalopathy, nonspecific etiology. No seizures or epileptiform discharges were seen throughout the recording. Priyanka Barbra Sarks   DG Hips Bilat W or Wo Pelvis 3-4 Views  Result Date: 06/10/2020 CLINICAL DATA:  Found on side of road, last seen normal at 2130 hours last night, fall, dementia, tenderness of hips bilaterally EXAM: DG HIP (WITH OR WITHOUT PELVIS) 3-4V BILAT COMPARISON:  None FINDINGS: Osseous demineralization. Hip and SI joint spaces preserved. No acute fracture, dislocation, or bone destruction. Mild degenerative disc disease changes at visualized lower lumbar spine. IMPRESSION: No acute osseous abnormalities. Electronically Signed   By: Lavonia Dana M.D.   On: 06/10/2020 11:48

## 2020-06-27 DIAGNOSIS — Z7189 Other specified counseling: Secondary | ICD-10-CM | POA: Diagnosis not present

## 2020-06-27 DIAGNOSIS — Z515 Encounter for palliative care: Secondary | ICD-10-CM | POA: Diagnosis not present

## 2020-06-27 LAB — BRAIN NATRIURETIC PEPTIDE: B Natriuretic Peptide: 48 pg/mL (ref 0.0–100.0)

## 2020-06-27 LAB — COMPREHENSIVE METABOLIC PANEL WITH GFR
ALT: 35 U/L (ref 0–44)
AST: 31 U/L (ref 15–41)
Albumin: 2.8 g/dL — ABNORMAL LOW (ref 3.5–5.0)
Alkaline Phosphatase: 52 U/L (ref 38–126)
Anion gap: 8 (ref 5–15)
BUN: 13 mg/dL (ref 8–23)
CO2: 25 mmol/L (ref 22–32)
Calcium: 8.8 mg/dL — ABNORMAL LOW (ref 8.9–10.3)
Chloride: 105 mmol/L (ref 98–111)
Creatinine, Ser: 1.03 mg/dL (ref 0.61–1.24)
GFR, Estimated: >60 mL/min
Glucose, Bld: 140 mg/dL — ABNORMAL HIGH (ref 70–99)
Potassium: 3.7 mmol/L (ref 3.5–5.1)
Sodium: 138 mmol/L (ref 135–145)
Total Bilirubin: 0.5 mg/dL (ref 0.3–1.2)
Total Protein: 5.3 g/dL — ABNORMAL LOW (ref 6.5–8.1)

## 2020-06-27 LAB — MAGNESIUM: Magnesium: 2 mg/dL (ref 1.7–2.4)

## 2020-06-27 LAB — CBC WITH DIFFERENTIAL/PLATELET
Abs Immature Granulocytes: 0.02 10*3/uL (ref 0.00–0.07)
Basophils Absolute: 0 10*3/uL (ref 0.0–0.1)
Basophils Relative: 0 %
Eosinophils Absolute: 0.1 10*3/uL (ref 0.0–0.5)
Eosinophils Relative: 1 %
HCT: 34.6 % — ABNORMAL LOW (ref 39.0–52.0)
Hemoglobin: 11.3 g/dL — ABNORMAL LOW (ref 13.0–17.0)
Immature Granulocytes: 0 %
Lymphocytes Relative: 33 %
Lymphs Abs: 3 10*3/uL (ref 0.7–4.0)
MCH: 28.2 pg (ref 26.0–34.0)
MCHC: 32.7 g/dL (ref 30.0–36.0)
MCV: 86.3 fL (ref 80.0–100.0)
Monocytes Absolute: 0.7 10*3/uL (ref 0.1–1.0)
Monocytes Relative: 7 %
Neutro Abs: 5.1 10*3/uL (ref 1.7–7.7)
Neutrophils Relative %: 59 %
Platelets: 211 10*3/uL (ref 150–400)
RBC: 4.01 MIL/uL — ABNORMAL LOW (ref 4.22–5.81)
RDW: 13.5 % (ref 11.5–15.5)
WBC: 8.9 10*3/uL (ref 4.0–10.5)
nRBC: 0 % (ref 0.0–0.2)

## 2020-06-27 LAB — VITAMIN B1: Vitamin B1 (Thiamine): 136.7 nmol/L (ref 66.5–200.0)

## 2020-06-27 LAB — C-REACTIVE PROTEIN: CRP: 0.6 mg/dL

## 2020-06-27 LAB — GLUCOSE, CAPILLARY
Glucose-Capillary: 150 mg/dL — ABNORMAL HIGH (ref 70–99)
Glucose-Capillary: 226 mg/dL — ABNORMAL HIGH (ref 70–99)
Glucose-Capillary: 184 mg/dL — ABNORMAL HIGH (ref 70–99)
Glucose-Capillary: 123 mg/dL — ABNORMAL HIGH (ref 70–99)

## 2020-06-27 MED ORDER — LACTATED RINGERS IV SOLN: INTRAVENOUS | Status: DC

## 2020-06-27 MED ORDER — SENNA 8.6 MG PO TABS
2.0000 | ORAL_TABLET | Freq: Every day | ORAL | Status: DC
  Administered 2020-06-27 – 2020-07-14 (×18): 17.2 mg via ORAL
  Filled 2020-06-27 (×18): qty 2

## 2020-06-27 NOTE — Consult Note (Signed)
  Reason for Consult: '' Please follow daily, extremely drowsy on Psych Meds, despite Depakote dose being cut.'   Patient is a 80 y.o. male with PMHx of vascular dementia, prior CVA, DM-2, HTN-who was admitted to the hospital with fever and confusion. He is assessed  face to face  in his room for psychiatric follow up care. He is alert but a little drowsy this morning but cooperative. He remains disoriented to place, person and time. But continues to show favorable response to low dose Seroquel and Depakote. Per  Chart review, there is no documented  aggression and agitation in the last 12 hours but patient condition is known to wax and wane.    Diagnosis: Demential with behavioral disturbances.   Treatment Plan -Continue to Recommend 1:1 safety sitter. Safety sitters offer supportive interaction with patient, and can easily identify patient needs, reorient, and address environmental factors that contribute to the delirium. If patient requires restraints, recommend a safety sitter also.  -  If patient remains agitated and trying to remove medical monitoring devices or IVs, may need to consider discontinuing hiding unnecessary devices to further decrease delirium and agitation.  -Patient with documented diagnosis of dementia with rapid cognitive decline this admission, it should be recognized that the level of care he requires is outside his family and caregiver capacities, and he will likely benefit from structured facility.   -Continue recommendations with physical therapy, early mobilization does help with cognitive stimulation and improves delirium.  -Will continue Seroquel 25 mg po at bedtime for aggression/behavioral disturbances. -Will continue Depakote Sprinkle 125 mg twice daily for aggression -Please monitor QTc level as deemed necessary.  -Recommend geropsych inpatient once medically stable. Patient does not appear to be responding to traditional pharmacological treatment modalities.   -Recommend TOC consult for inpatient Geropsych referral.   Corena Pilgrim, MD Attending psychiatrist

## 2020-06-27 NOTE — Progress Notes (Signed)
PROGRESS NOTE                                                                                                                                                                                                             Patient Demographics:    Patrick Phillips, is a 80 y.o. male, DOB - 22-May-1940, [id number]  Outpatient Primary MD for the patient is [name], MD   Admit date - 06/10/2020   LOS - 15  Chief Complaint  Patient presents with  . Altered Mental Status       Brief Narrative: Patient is a 80 y.o. male with PMHx of vascular dementia, prior CVA, DM-2, HTN-who presented with fever and confusion.  COVID-19 vaccinated status:  Unknown  Significant Events:  4/14>> Admit to [facility] for fever/confusion  Significant studies:  4/14>>Chest x-ray: No PNA 4/14>> CT head: No acute intracranial abnormality. 4/14>> CT C-spine: No fracture 4/14>> MRI brain: No acute infarct. 4/15>> chest x-ray: No PNA 4/15>> CSF: WBC 1 (tube #4), protein 46, glucose 54 4/16>> EEG: Mild to moderate diffuse encephalopathy-no seizures.  COVID-19 medications:  Remdesivir: 4/15>>  Antibiotics:  Vancomycin: 4/14 x1 Ceftriaxone: 4/14 x 1 Ampicillin: 4/14 x 1  Microbiology data:  4/14 >>blood culture: No growth 4/14>> urine culture: No growth 4/15>> blood culture: No growth 4/15>> urine culture: Multiple species 4/15>> CSF culture: No growth 4/15>> CSF PCR HSV 1/2: Negative. 4/15>> CSF HSV culture: Negative 4/15>> CSF arbovirus IgG: Negative 4/15>> CSF VDRL: Negative RMSF CSF serology IgM -ve  Procedures:  4/15>> lumbar puncture in the emergency room  Consults:  Neurology, psychiatry, Pall.Care  DVT prophylaxis:  enoxaparin (LOVENOX) injection 40 mg Start: 06/11/20 0600    Subjective:   Patient in bed, mildly sleepy but in no discomfort, unreliable historian but denies any headache or chest pain.   Assessment   & Plan :   Acute metabolic encephalopathy-superimposed on underlying Dementia with delirium: Encephalopathy on presentation was likely related to febrile illness/COVID-19 infection-all culture data negative so far.  CT head, MRI brain and EEG unremarkable. He has had no fever since admission-his encephalopathy I suspect his confusion/agitation now is related to  delirium associated with dementia. Neuro and Psych consulted, Psych requested by me to follow daily.  Evaluated by psychiatry and was initially started on Saphris and Depakote-  Saphris was stopped on 06/23/20 due to somnolence and was replaced by Seroquel, have reduced Depakote dose due to sedation along with Seroquel on 06/25/2020 with less somnolence, continue on the lower dose Seroquel with Depakote.  Note mental status is waxing and waning but overall mentation is still quite poor, have involved palliative care for goals of care as ordered intake remains very poor and needs augmentation with IV fluids every few days.   ? Tick Bite: 2 ticks found by nursing staff on patient 06/17/20 - RMSF IgM - ve, DW ID Dr Tommy Medal 06/23/20 - not acute, no Rx.   AKI: Mild-likely hemodynamically mediated-resolved with IV fluids.  Borderline vitamin B12 deficiency: On supplementation.  Hypokalemia: Repleted-recheck periodically.  HTN: Currently on Coreg, Norvasc, added hydralazine for better control.  History of CVA-MRI brain negative for infarct-on aspirin/statin.  Dementia: On Namenda  GERD: On PPI  SIRS-COVID-19 infection: Completed Remdesivir x3 days.  Not hypoxic-no pneumonia-does not require any further therapy.  Interestingly-patient apparently had COVID-19 infection in February as well.  Has finished his COVID isolation on 06/21/2020  Recent Labs    06/25/20 0332 06/26/20 0444 06/27/20 0052  CRP 0.6 0.8 0.6    Lab Results  Component Value Date   SARSCOV2NAA POSITIVE (A) 06/11/2020   [place] NEGATIVE 01/13/2020   DM-2: CBGs  relatively stable-continue SSI-oral hypoglycemic agents on hold  Recent Labs    06/26/20 1655 06/26/20 2111 06/27/20 0805  GLUCAP 155* 123* 150*     Condition -   Guarded  Family Communication  :   Daughter [phone]-updated over the phone on 4/22   Daughter- Brenda-[phone] at bedside on 4/21, 06/19/20, 06/21/20, 06/23/20, 06/25/2018   Code Status :  Full Code  Diet :  Diet Order            Diet regular Room service appropriate? No; Fluid consistency: Thin  Diet effective now                  Disposition Plan  :   Status is: Inpatient  Remains inpatient appropriate because:Inpatient level of care appropriate due to severity of illness   Dispo: The patient is from: Home              Anticipated d/c is to: Home              Patient currently is not medically stable to d/c.   Difficult to place patient No   Barriers to discharge: Resolving encephalopathy-awaiting final culture data.  Antimicorbials  :    Anti-infectives (From admission, onward)   Start     Dose/Rate Route Frequency Ordered Stop   06/18/20 1745  doxycycline (VIBRAMYCIN) 100 mg in sodium chloride 0.9 % 250 mL IVPB  Status:  Discontinued        100 mg 125 mL/hr over 120 Minutes Intravenous Every 12 hours 06/18/20 1648 06/23/20 0847   06/12/20 1000  remdesivir 100 mg in sodium chloride 0.9 % 100 mL IVPB        100 mg 200 mL/hr over 30 Minutes Intravenous Daily 06/11/20 0435 06/13/20 0900   06/11/20 0445  remdesivir 200 mg in sodium chloride 0.9% 250 mL IVPB        200 mg 580 mL/hr over 30 Minutes Intravenous Once 06/11/20 0434 06/11/20 0929   06/11/20 0230  vancomycin (  VANCOREADY) IVPB 1500 mg/300 mL        1,500 mg 150 mL/hr over 120 Minutes Intravenous  Once 06/11/20 0229 06/11/20 0812   06/11/20 0215  ampicillin (OMNIPEN) 2 g in sodium chloride 0.9 % 100 mL IVPB        2 g 300 mL/hr over 20 Minutes Intravenous  Once 06/11/20 0157 06/11/20 0550   06/11/20 0215  vancomycin  (VANCOREADY) IVPB 1000 mg/200 mL  Status:  Discontinued        1,000 mg 200 mL/hr over 60 Minutes Intravenous  Once 06/11/20 0157 06/11/20 0229   06/11/20 0200  cefTRIAXone (ROCEPHIN) 2 g in sodium chloride 0.9 % 100 mL IVPB        2 g 200 mL/hr over 30 Minutes Intravenous  Once 06/11/20 0157 06/11/20 0248      Inpatient Medications  Scheduled Meds: . amLODipine  10 mg Oral Daily  . aspirin EC  81 mg Oral Daily  . atorvastatin  40 mg Oral Daily  . carvedilol  6.25 mg Oral BID WC  . divalproex  125 mg Oral Q12H  . enoxaparin (LOVENOX) injection  40 mg Subcutaneous Q24H  . feeding supplement  237 mL Oral BID BM  . hydrALAZINE  50 mg Oral Q8H  . insulin aspart  0-9 Units Subcutaneous TID WC  . multivitamin with minerals  1 tablet Oral Daily  . naLOXone (NARCAN)  injection  0.4 mg Intravenous Once  . pantoprazole  40 mg Oral Daily  . QUEtiapine  25 mg Oral QHS  . senna  2 tablet Oral Daily  . tamsulosin  0.4 mg Oral Daily  . thiamine  100 mg Oral Daily  . vitamin B-12  1,000 mcg Oral Daily   Continuous Infusions: . lactated ringers     PRN Meds:.acetaminophen, haloperidol lactate, hydrALAZINE, [DISCONTINUED] ondansetron **OR** ondansetron (ZOFRAN) IV   Time Spent in minutes  25    See all Orders from today for further details   Lala Lund M.D on 06/27/2020 at 10:19 AM  To page go to [email or web address] - use universal password  [facility] -  Office  [phone]    Objective:   Vitals:   06/26/20 1713 06/26/20 2026 06/27/20 0426 06/27/20 0904  BP: (!) 157/75 130/65 127/70 (!) 117/42  Pulse: 80 78 75 (!) 59  Resp: 20 18 18    Temp: 97.6 F (36.4 C) 98 F (36.7 C) 98.2 F (36.8 C)   TempSrc: Axillary Axillary Axillary   SpO2: 96% 97% 98% 100%  Weight:      Height:        Wt Readings from Last 3 Encounters:  06/10/20 86.2 kg  06/10/20 86.2 kg  04/19/20 86.2 kg     Intake/Output Summary (Last 24 hours) at 06/27/2020 1019 Last data filed at 06/27/2020  0433 Gross per 24 hour  Intake 120 ml  Output 700 ml  Net -580 ml     Physical Exam  Sleeping but arousable, no FN deficits [place].AT,PERRAL Supple Neck,No JVD, No cervical lymphadenopathy appriciated.  Symmetrical Chest wall movement, Good air movement bilaterally, CTAB RRR,No Gallops, Rubs or new Murmurs, No Parasternal Heave +ve B.Sounds, Abd Soft, No tenderness, No organomegaly appriciated, No rebound - guarding or rigidity. No Cyanosis, Clubbing or edema, No new Rash or bruise    Data Review:    CBC Recent Labs  Lab 06/21/20 0141 06/22/20 0739 06/23/20 0608 06/25/20 0332 06/26/20 0444 06/27/20 0052  WBC 10.0 11.6* 10.2 12.1* 8.8 8.9  HGB  13.9 15.1 13.2 12.5* 11.9* 11.3*  HCT 42.2 46.3 40.5 38.4* 36.2* 34.6*  PLT 230 268 238 222 226 211  MCV 85.4 86.2 86.5 86.5 86.4 86.3  MCH 28.1 28.1 28.2 28.2 28.4 28.2  MCHC 32.9 32.6 32.6 32.6 32.9 32.7  RDW 13.6 13.9 14.1 13.7 13.8 13.5  LYMPHSABS 3.6 3.0  --  3.9 3.2 3.0  MONOABS 0.7 0.8  --  1.0 0.7 0.7  EOSABS 0.2 0.2  --  0.2 0.2 0.1  BASOSABS 0.0 0.0  --  0.0 0.0 0.0    Chemistries  Recent Labs  Lab 06/22/20 0739 06/23/20 0420 06/24/20 0725 06/25/20 0332 06/26/20 0444 06/27/20 0052  NA 140 140 138 140 139 138  K 4.1 3.7 4.0 3.5 3.5 3.7  CL 105 105 105 107 107 105  CO2 25 23 25 26 24 25   GLUCOSE 150* 132* 178* 129* 147* 140*  BUN 23 25* 23 19 18 13   CREATININE 1.08 1.12 1.10 1.10 1.03 1.03  CALCIUM 9.8 9.6 9.1 9.0 8.8* 8.8*  MG 2.2 2.2 2.1 2.2 2.1 2.0  AST 34 32  --  31 27 31   ALT 63* 54*  --  43 37 35  ALKPHOS 62 58  --  54 48 52  BILITOT 0.8 1.2  --  0.8 0.6 0.5   ------------------------------------------------------------------------------------------------------------------ No results for input(s): CHOL, HDL, LDLCALC, TRIG, CHOLHDL, LDLDIRECT in the last 72 hours.  Lab Results  Component Value Date   HGBA1C 6.6 (H) 06/11/2020    ------------------------------------------------------------------------------------------------------------------ No results for input(s): TSH, T4TOTAL, T3FREE, THYROIDAB in the last 72 hours.  Invalid input(s): FREET3 ------------------------------------------------------------------------------------------------------------------ No results for input(s): VITAMINB12, FOLATE, FERRITIN, TIBC, IRON, RETICCTPCT in the last 72 hours.  Coagulation profile No results for input(s): INR, PROTIME in the last 168 hours.  No results for input(s): DDIMER in the last 72 hours.  Cardiac Enzymes No results for input(s): CKMB, TROPONINI, MYOGLOBIN in the last 168 hours.  Invalid input(s): CK ------------------------------------------------------------------------------------------------------------------    Component Value Date/Time   BNP 48.0 06/27/2020 0052    Micro Results No results found for this or any previous visit (from the past 240 hour(s)).  Radiology Reports CT Head Wo Contrast  Result Date: 06/10/2020 CLINICAL DATA:  Head and C-spine injury, fall, altered mental status EXAM: CT HEAD WITHOUT CONTRAST CT CERVICAL SPINE WITHOUT CONTRAST TECHNIQUE: Multidetector CT imaging of the head and cervical spine was performed following the standard protocol without intravenous contrast. Multiplanar CT image reconstructions of the cervical spine were also generated. COMPARISON:  05/04/2020 FINDINGS: CT HEAD FINDINGS Brain: Stable mild brain atrophy pattern. No acute intracranial hemorrhage, new mass lesion, acute infarction, midline shift, herniation, hydrocephalus, or extra-axial fluid collection. No focal mass effect or edema. Cisterns are patent. Cerebellar atrophy as well. Vascular: Intracranial atherosclerosis at the skull base. No hyperdense vessel. Skull: Normal. Negative for fracture or focal lesion. Sinuses/Orbits: Minor scattered sinus mucosal thickening as before. Orbits are symmetric. No  acute finding. Other: None. CT CERVICAL SPINE FINDINGS Alignment: Normal. Skull base and vertebrae: No acute fracture. No primary bone lesion or focal pathologic process. Soft tissues and spinal canal: Normal prevertebral soft tissues. No hemorrhage or hematoma appreciated. Carotid atherosclerosis. Disc levels: Similar mild multilevel degenerative disc disease and facet arthropathy. Facets are aligned. No subluxation or dislocation. Preserved vertebral body heights. Upper chest: Negative. Other: None. IMPRESSION: Stable brain atrophy pattern. No acute intracranial abnormality or interval change by noncontrast CT. Stable mild cervical degenerative changes as above. No acute osseous  finding or malalignment by CT. Electronically Signed   By: Judie Petit.  Shick M.D.   On: 06/10/2020 12:31   CT Cervical Spine Wo Contrast  Result Date: 06/10/2020 CLINICAL DATA:  Head and C-spine injury, fall, altered mental status EXAM: CT HEAD WITHOUT CONTRAST CT CERVICAL SPINE WITHOUT CONTRAST TECHNIQUE: Multidetector CT imaging of the head and cervical spine was performed following the standard protocol without intravenous contrast. Multiplanar CT image reconstructions of the cervical spine were also generated. COMPARISON:  05/04/2020 FINDINGS: CT HEAD FINDINGS Brain: Stable mild brain atrophy pattern. No acute intracranial hemorrhage, new mass lesion, acute infarction, midline shift, herniation, hydrocephalus, or extra-axial fluid collection. No focal mass effect or edema. Cisterns are patent. Cerebellar atrophy as well. Vascular: Intracranial atherosclerosis at the skull base. No hyperdense vessel. Skull: Normal. Negative for fracture or focal lesion. Sinuses/Orbits: Minor scattered sinus mucosal thickening as before. Orbits are symmetric. No acute finding. Other: None. CT CERVICAL SPINE FINDINGS Alignment: Normal. Skull base and vertebrae: No acute fracture. No primary bone lesion or focal pathologic process. Soft tissues and spinal  canal: Normal prevertebral soft tissues. No hemorrhage or hematoma appreciated. Carotid atherosclerosis. Disc levels: Similar mild multilevel degenerative disc disease and facet arthropathy. Facets are aligned. No subluxation or dislocation. Preserved vertebral body heights. Upper chest: Negative. Other: None. IMPRESSION: Stable brain atrophy pattern. No acute intracranial abnormality or interval change by noncontrast CT. Stable mild cervical degenerative changes as above. No acute osseous finding or malalignment by CT. Electronically Signed   By: Judie Petit.  Shick M.D.   On: 06/10/2020 12:31   MR Brain Wo Contrast (neuro protocol)  Result Date: 06/10/2020 CLINICAL DATA:  Acute neuro deficit.  Mental status change. EXAM: MRI HEAD WITHOUT CONTRAST TECHNIQUE: Multiplanar, multiecho pulse sequences of the brain and surrounding structures were obtained without intravenous contrast. COMPARISON:  CT head 06/10/2020 FINDINGS: Brain: Ventricle size and cerebral volume normal for age. Negative for acute infarct. No significant chronic ischemic change. Negative for hemorrhage or mass. Image quality degraded by mild motion. Vascular: Partial loss of flow void in the left internal carotid artery through the skull base and the distal left vertebral artery which may be due to slow flow or stenosis. Skull and upper cervical spine: Negative Sinuses/Orbits: Mild mucosal edema paranasal sinuses. No orbital mass. Bilateral exophthalmos. Other: None IMPRESSION: Negative for acute infarct. Partial loss of flow void in the left internal carotid artery distal left vertebral artery which could be due to proximal stenosis. Consider CT angio head and neck for further evaluation. Electronically Signed   By: Marlan Palau M.D.   On: 06/10/2020 15:09   DG Chest Port 1 View  Result Date: 06/25/2020 CLINICAL DATA:  Shortness of breath and lethargy. EXAM: PORTABLE CHEST 1 VIEW COMPARISON:  Chest x-ray 06/24/2020 FINDINGS: The cardiac silhouette,  mediastinal and hilar contours are within limits. Stable mild tortuosity and calcification the thoracic aorta. The lungs are clear of an acute process. No pleural effusions or pulmonary lesions. The bony thorax is intact. IMPRESSION: No acute cardiopulmonary findings. Electronically Signed   By: Rudie Meyer M.D.   On: 06/25/2020 14:15   DG Chest Port 1 View  Result Date: 06/24/2020 CLINICAL DATA:  Shortness of breath. EXAM: PORTABLE CHEST 1 VIEW COMPARISON:  June 20, 2020. FINDINGS: The heart size and mediastinal contours are within normal limits. Both lungs are clear. The visualized skeletal structures are unremarkable. IMPRESSION: No active disease. Electronically Signed   By: Lupita Raider M.D.   On: 06/24/2020 08:13  DG Chest Port 1 View  Result Date: 06/20/2020 CLINICAL DATA:  Shortness of breath EXAM: PORTABLE CHEST 1 VIEW COMPARISON:  06/11/2020 FINDINGS: Loop recorder is identified adjacent to the cardiac apex. Normal heart size. No pleural effusion or edema. No airspace densities identified. No acute osseous findings. IMPRESSION: No acute cardiopulmonary abnormalities. Electronically Signed   By: Kerby Moors M.D.   On: 06/20/2020 08:34   DG Chest Port 1 View  Result Date: 06/11/2020 CLINICAL DATA:  Questionable sepsis. EXAM: PORTABLE CHEST 1 VIEW COMPARISON:  June 10, 2020 FINDINGS: There is no evidence of acute infiltrate, pleural effusion or pneumothorax. The heart size and mediastinal contours are within normal limits. A radiopaque loop recorder device is seen. The visualized skeletal structures are unremarkable. IMPRESSION: No active disease. Electronically Signed   By: Virgina Norfolk M.D.   On: 06/11/2020 03:10   DG Chest Port 1 View  Result Date: 06/10/2020 CLINICAL DATA:  Altered mental status. EXAM: PORTABLE CHEST 1 VIEW COMPARISON:  May 25, 2020 FINDINGS: Loop recorder in place. Cardiomediastinal silhouette is normal. Mediastinal contours appear intact. Tortuosity and  calcific atherosclerotic disease of the aorta. There is no evidence of focal airspace consolidation, pleural effusion or pneumothorax. Osseous structures are without acute abnormality. Soft tissues are grossly normal. IMPRESSION: No active disease. Electronically Signed   By: Fidela Salisbury M.D.   On: 06/10/2020 11:15   EEG adult  Result Date: 06/12/2020 Lora Havens, MD     06/12/2020  3:07 PM Patient Name: Camerin Ladouceur MRN: [id number] Epilepsy Attending: Lora Havens Referring Physician/Provider: Dr Oren Binet Date: 06/12/2020 Duration: 25.42 mins Patient history: 80yo M with ams. EEG to evaluate for seizure Level of alertness: Awake AEDs during EEG study: None Technical aspects: This EEG study was done with scalp electrodes positioned according to the 10-20 International system of electrode placement. Electrical activity was acquired at a sampling rate of 500Hz  and reviewed with a high frequency filter of 70Hz  and a low frequency filter of 1Hz . EEG data were recorded continuously and digitally stored. Description: No posterior dominant rhythm was seen. EEG showed continuous generalized 5 to 6 Hz theta slowing. Hyperventilation and photic stimulation were not performed.   ABNORMALITY - Continuous slow, generalized IMPRESSION: This study is suggestive of mild to moderate diffuse encephalopathy, nonspecific etiology. No seizures or epileptiform discharges were seen throughout the recording. Priyanka Barbra Sarks   DG Hips Bilat W or Wo Pelvis 3-4 Views  Result Date: 06/10/2020 CLINICAL DATA:  Found on side of road, last seen normal at 2130 hours last night, fall, dementia, tenderness of hips bilaterally EXAM: DG HIP (WITH OR WITHOUT PELVIS) 3-4V BILAT COMPARISON:  None FINDINGS: Osseous demineralization. Hip and SI joint spaces preserved. No acute fracture, dislocation, or bone destruction. Mild degenerative disc disease changes at visualized lower lumbar spine. IMPRESSION: No acute osseous  abnormalities. Electronically Signed   By: Lavonia Dana M.D.   On: 06/10/2020 11:48

## 2020-06-27 NOTE — Progress Notes (Signed)
   Palliative Medicine Inpatient Follow Up Note  Reason for consult:  "severe delirium, demented, poor Po intake, no improvement in weeks. GOC"  HPI:  Per intake H&P --> Patrick Phillips a 79 y.o.malewith medical history significant ofvascular dementia, DM2, HTN. Admitted on 4/15 for altered mental status. Has had a complicated delirium since admission with e/o combativeness.  Palliative care has been asked to get involved in the setting of little improvement since admission.   Today's Discussion (06/27/2020):  *Please note that this is a verbal dictation therefore any spelling or grammatical errors are due to the "[place] [employer]" system interpretation.  Chart reviewed. I met with Patrick Phillips at bedside this morning he was far more alert and conversant. He was able to tell me who he was though he was mildly confused regarding where he was therefore reorientation was pursued.  It does appear that psychiatric meds -Depakote every 12 hours, seroquel and PRN haldol $RemoveBe'5mg'[id number]$  (last dose at 430 last night) are aiding in patient's delirium management.  Per review with nursing technician she not identified that he was combative throughout the night or morning.   Nutritional intake appears excellent, last BM on 4/28.  Goals from palliative perspective remain clear for patient to go to a [place] psychiatric facility and then for long-term placement if he remains be altered and a potential threat/harm to self or others.   Questions and concerns addressed   Objective Assessment: Vital Signs Vitals:   06/27/20 0426 06/27/20 0904  BP: 127/70 (!) 117/42  Pulse: 75 (!) 59  Resp: 18   Temp: 98.2 F (36.8 C)   SpO2: 98% 100%    Intake/Output Summary (Last 24 hours) at 06/27/2020 0263 Last data filed at 06/27/2020 7858 Gross per 24 hour  Intake 120 ml  Output 700 ml  Net -580 ml   Last Weight  Most recent update: 06/10/2020 11:13 PM   Weight  86.2 kg (190 lb 0.6 oz)           Gen:  Elderly appearing  M in NAD HEENT: moist mucous membranes CV: Regular rate and irregular rhythm  PULM: On RA ABD: soft/nontender  EXT: No edema  Neuro: Alert and oriented x1  SUMMARY OF RECOMMENDATIONS DNAR/DNI  TOC - SW to search for OP Geri-Psychiatry bed  Patient will require long term placement thereafter if continues to have complicated delirium  Will provide recommendation for outpatient palliative ongoing support  Ongoing incremental PMT support and conversations    Time Spent: 25 Greater than 50% of the time was spent in counseling and coordination of care ______________________________________________________________________________________ [place] Team Team Cell Phone: [phone] Please utilize secure chat with additional questions, if there is no response within 30 minutes please call the above phone number  Palliative Medicine Team providers are available by phone from 7am to 7pm daily and can be reached through the team cell phone.  Should this patient require assistance outside of these hours, please call the patient's attending physician.

## 2020-06-28 ENCOUNTER — Inpatient Hospital Stay (HOSPITAL_COMMUNITY): Payer: Medicare Other

## 2020-06-28 DIAGNOSIS — G9341 Metabolic encephalopathy: Secondary | ICD-10-CM | POA: Diagnosis not present

## 2020-06-28 DIAGNOSIS — Z515 Encounter for palliative care: Secondary | ICD-10-CM | POA: Diagnosis not present

## 2020-06-28 DIAGNOSIS — Z7189 Other specified counseling: Secondary | ICD-10-CM | POA: Diagnosis not present

## 2020-06-28 DIAGNOSIS — Z66 Do not resuscitate: Secondary | ICD-10-CM | POA: Diagnosis not present

## 2020-06-28 LAB — URINALYSIS, ROUTINE W REFLEX MICROSCOPIC
Bilirubin Urine: NEGATIVE
Glucose, UA: 50 mg/dL — AB
Ketones, ur: 5 mg/dL — AB
Leukocytes,Ua: NEGATIVE
Nitrite: NEGATIVE
Protein, ur: NEGATIVE mg/dL
RBC / HPF: >50 RBC/hpf — ABNORMAL HIGH (ref 0–5)
Specific Gravity, Urine: 1.011 (ref 1.005–1.030)
pH: 7 (ref 5.0–8.0)

## 2020-06-28 LAB — MAGNESIUM: Magnesium: 2 mg/dL (ref 1.7–2.4)

## 2020-06-28 LAB — COMPREHENSIVE METABOLIC PANEL
ALT: 34 U/L (ref 0–44)
AST: 27 U/L (ref 15–41)
Albumin: 2.8 g/dL — ABNORMAL LOW (ref 3.5–5.0)
Alkaline Phosphatase: 50 U/L (ref 38–126)
Anion gap: 7 (ref 5–15)
BUN: 11 mg/dL (ref 8–23)
CO2: 25 mmol/L (ref 22–32)
Calcium: 8.8 mg/dL — ABNORMAL LOW (ref 8.9–10.3)
Chloride: 106 mmol/L (ref 98–111)
Creatinine, Ser: 1.1 mg/dL (ref 0.61–1.24)
GFR, Estimated: >60 mL/min (ref >60)
Glucose, Bld: 139 mg/dL — ABNORMAL HIGH (ref 70–99)
Potassium: 3.6 mmol/L (ref 3.5–5.1)
Sodium: 138 mmol/L (ref 135–145)
Total Bilirubin: 0.7 mg/dL (ref 0.3–1.2)
Total Protein: 5.2 g/dL — ABNORMAL LOW (ref 6.5–8.1)

## 2020-06-28 LAB — BRAIN NATRIURETIC PEPTIDE: B Natriuretic Peptide: 39.4 pg/mL (ref 0.0–100.0)

## 2020-06-28 LAB — CBC WITH DIFFERENTIAL/PLATELET
Abs Immature Granulocytes: 0.03 10*3/uL (ref 0.00–0.07)
Basophils Absolute: 0 10*3/uL (ref 0.0–0.1)
Basophils Relative: 0 %
Eosinophils Absolute: 0.1 10*3/uL (ref 0.0–0.5)
Eosinophils Relative: 1 %
HCT: 34 % — ABNORMAL LOW (ref 39.0–52.0)
Hemoglobin: 11.3 g/dL — ABNORMAL LOW (ref 13.0–17.0)
Immature Granulocytes: 0 %
Lymphocytes Relative: 32 %
Lymphs Abs: 2.7 10*3/uL (ref 0.7–4.0)
MCH: 28.8 pg (ref 26.0–34.0)
MCHC: 33.2 g/dL (ref 30.0–36.0)
MCV: 86.5 fL (ref 80.0–100.0)
Monocytes Absolute: 0.6 10*3/uL (ref 0.1–1.0)
Monocytes Relative: 8 %
Neutro Abs: 4.8 10*3/uL (ref 1.7–7.7)
Neutrophils Relative %: 59 %
Platelets: 232 10*3/uL (ref 150–400)
RBC: 3.93 MIL/uL — ABNORMAL LOW (ref 4.22–5.81)
RDW: 13.5 % (ref 11.5–15.5)
WBC: 8.3 10*3/uL (ref 4.0–10.5)
nRBC: 0 % (ref 0.0–0.2)

## 2020-06-28 LAB — GLUCOSE, CAPILLARY
Glucose-Capillary: 136 mg/dL — ABNORMAL HIGH (ref 70–99)
Glucose-Capillary: 211 mg/dL — ABNORMAL HIGH (ref 70–99)
Glucose-Capillary: 164 mg/dL — ABNORMAL HIGH (ref 70–99)
Glucose-Capillary: 204 mg/dL — ABNORMAL HIGH (ref 70–99)
Glucose-Capillary: 153 mg/dL — ABNORMAL HIGH (ref 70–99)

## 2020-06-28 LAB — C-REACTIVE PROTEIN: CRP: 0.6 mg/dL (ref <1.0)

## 2020-06-28 MED ORDER — LORAZEPAM 2 MG/ML IJ SOLN
1.0000 mg | Freq: Once | INTRAMUSCULAR | Status: DC
  Filled 2020-06-28 (×4): qty 1

## 2020-06-28 MED ORDER — LACTATED RINGERS IV SOLN: INTRAVENOUS | Status: AC

## 2020-06-28 NOTE — Code Documentation (Signed)
RRT notified by bedside RN at [facility] that Code Stroke was being called on pt d/t R facial droop and drooling. RRT requested that RN get CBG on pt. RRT arrived at bedside at [facility] along with Dr. Rory Percy with neurology. CBG-204, NIH-5 for LOC questions, R facial droop, and mild aphasia/dysarthria. LSN-unclear d/t facial droop seen by neuro on exam done 4/19.  Pt transported to CT scan-arrived at 1907. CT head negative for acute changes. Code Stroke cancelled. Plan for MRI and q4h neuro exams.

## 2020-06-28 NOTE — Progress Notes (Addendum)
Neurology Progress Note   S:// Neurology was recalled for evaluation of the patient as a code stroke. Some right-sided facial drooling and subtle right facial drooping was discovered by the bedside RN. Chart review reveals the patient was seen by Dr. Cheral Marker in initial consultation for altered mental status-with CSF that was unremarkable.  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 Psychiatry is also following the patient-recommended geriatric psychiatry admission once stable  Today, the bedside nurse noted that patient was drooling fluid from the right angle of his mouth.  His mild right-sided facial asymmetry was noted by Dr. Cheral Marker on the initial consultation done in April 2022. Other than that, he continued to have deficits in terms of his cognition-unable to tell me where he is, correct date, month, age etc. Also has poor attention concentration.  Last known well: Unclear-right-sided facial deficits have been seen ever since the consultation from April 2022. Modified Rankin-at this time a 4. tPA given-no due to unclear last known well   O:// Current vital signs: BP (!) 163/62   Pulse 92   Temp 98.9 F (37.2 C) (Oral)   Resp 19   Ht 6' (1.829 m)   Wt 86.2 kg   SpO2 100%   BMI 25.77 kg/m  Vital signs in last 24 hours: Temp:  [98.1 F (36.7 C)-98.9 F (37.2 C)] 98.9 F (37.2 C) (05/02 1208) Pulse Rate:  [72-95] 92 (05/02 1726) Resp:  [18-19] 19 (05/02 1208) BP: (124-172)/(45-85) 163/62 (05/02 1726) SpO2:  [97 %-100 %] 100 % (05/02 1726) General: Awake alert in no distress HEENT: Normocephalic/atraumatic Lungs: Clear Cardiovascular: Regular rhythm Abdomen soft nondistended nontender Extremities warm well perfused Neurological exam Is awake, alert, oriented to self. Could not tell me where he is or the correct date or month. His speech was fluent but he has mild dysarthria. He has poor attention concentration and intermittently  follows commands. Cranial nerves: Pupils equal round react light, extraocular movements appear unrestricted, blinks to threat from both sides, mild right angle of the mouth weakness with intact and symmetric nasolabial folds bilaterally, auditory acuity diminished bilaterally, tongue and palate midline. Motor exam: Antigravity in all fours without drift Sensation intact light touch all over without extinction Coordination: Difficult to assess due to his mentation NIH stroke scale 1a Level of Conscious.: 0 1b LOC Questions: 2 1c LOC Commands: 0 2 Best Gaze: 0 3 Visual: 0 4 Facial Palsy: 1 5a Motor Arm - left: 0 5b Motor Arm - Right: 0 6a Motor Leg - Left: 0 6b Motor Leg - Right: 0 7 Limb Ataxia: 0 8 Sensory: 0 9 Best Language: 1 10 Dysarthria: 1 11 Extinct. and Inatten.: 0 TOTAL: 5   Medications  Current Facility-Administered Medications:  .  acetaminophen (TYLENOL) tablet 650 mg, 650 mg, Oral, Q6H PRN, Etta Quill, DO, 650 mg at 06/28/20 0911 .  amLODipine (NORVASC) tablet 10 mg, 10 mg, Oral, Daily, Ghimire, Henreitta Leber, MD, 10 mg at 06/28/20 0915 .  aspirin EC tablet 81 mg, 81 mg, Oral, Daily, Ghimire, Henreitta Leber, MD, 81 mg at 06/28/20 0911 .  atorvastatin (LIPITOR) tablet 40 mg, 40 mg, Oral, Daily, Ghimire, Henreitta Leber, MD, 40 mg at 06/28/20 0910 .  carvedilol (COREG) tablet 6.25 mg, 6.25 mg, Oral, BID WC, Thurnell Lose, MD, 6.25 mg at 06/28/20 1728 .  divalproex (DEPAKOTE SPRINKLE) capsule 125 mg, 125 mg, Oral, Q12H, Thurnell Lose, MD, 125 mg at 06/28/20 0910 .  enoxaparin (LOVENOX) injection 40 mg, 40 mg,  Subcutaneous, Q24H, Etta Quill, DO, 40 mg at 06/28/20 0736 .  feeding supplement (ENSURE ENLIVE / ENSURE PLUS) liquid 237 mL, 237 mL, Oral, BID BM, Ghimire, Shanker M, MD, 237 mL at 06/22/20 0802 .  haloperidol lactate (HALDOL) injection 5 mg, 5 mg, Intramuscular, Q6H PRN, Jonetta Osgood, MD, 5 mg at 06/26/20 1629 .  hydrALAZINE (APRESOLINE) injection 5 mg,  5 mg, Intravenous, Q6H PRN, Lala Lund K, MD .  hydrALAZINE (APRESOLINE) tablet 50 mg, 50 mg, Oral, Q8H, Thurnell Lose, MD, 50 mg at 06/28/20 1428 .  insulin aspart (novoLOG) injection 0-9 Units, 0-9 Units, Subcutaneous, TID WC, Ghimire, Henreitta Leber, MD, 2 Units at 06/28/20 1728 .  lactated ringers infusion, , Intravenous, Continuous, Thurnell Lose, MD, Last Rate: 50 mL/hr at 06/28/20 1217, New Bag at 06/28/20 1217 .  LORazepam (ATIVAN) injection 1 mg, 1 mg, Intravenous, Once, Thurnell Lose, MD .  multivitamin with minerals tablet 1 tablet, 1 tablet, Oral, Daily, Ghimire, Henreitta Leber, MD, 1 tablet at 06/28/20 0910 .  naloxone [facility]) injection 0.4 mg, 0.4 mg, Intravenous, Once, Thurnell Lose, MD .  [DISCONTINUED] ondansetron (ZOFRAN) tablet 4 mg, 4 mg, Oral, Q6H PRN **OR** ondansetron (ZOFRAN) injection 4 mg, 4 mg, Intravenous, Q6H PRN, Etta Quill, DO, 4 mg at 06/26/20 2205 .  pantoprazole (PROTONIX) EC tablet 40 mg, 40 mg, Oral, Daily, Wilson Singer I, RPH, 40 mg at 06/28/20 0910 .  QUEtiapine (SEROQUEL) tablet 25 mg, 25 mg, Oral, QHS, Thurnell Lose, MD, 25 mg at 06/27/20 2327 .  senna (SENOKOT) tablet 17.2 mg, 2 tablet, Oral, Daily, Ferolito, Freada Bergeron, NP, 17.2 mg at 06/28/20 0910 .  tamsulosin (FLOMAX) capsule 0.4 mg, 0.4 mg, Oral, Daily, Thurnell Lose, MD, 0.4 mg at 06/28/20 0910 .  [DISCONTINUED] thiamine (B-1) injection 100 mg, 100 mg, Intravenous, Daily **OR** thiamine tablet 100 mg, 100 mg, Oral, Daily, Bhagat, Srishti L, MD, 100 mg at 06/28/20 0910 .  vitamin B-12 (CYANOCOBALAMIN) tablet 1,000 mcg, 1,000 mcg, Oral, Daily, Lala Lund K, MD, 1,000 mcg at 06/28/20 0912 Labs CBC    Component Value Date/Time   WBC 8.3 06/28/2020 0044   RBC 3.93 (L) 06/28/2020 0044   HGB 11.3 (L) 06/28/2020 0044   HCT 34.0 (L) 06/28/2020 0044   PLT 232 06/28/2020 0044   MCV 86.5 06/28/2020 0044   MCH 28.8 06/28/2020 0044   MCHC 33.2 06/28/2020 0044   RDW 13.5  06/28/2020 0044   LYMPHSABS 2.7 06/28/2020 0044   MONOABS 0.6 06/28/2020 0044   EOSABS 0.1 06/28/2020 0044   BASOSABS 0.0 06/28/2020 0044    CMP     Component Value Date/Time   NA 138 06/28/2020 0044   K 3.6 06/28/2020 0044   CL 106 06/28/2020 0044   CO2 25 06/28/2020 0044   GLUCOSE 139 (H) 06/28/2020 0044   BUN 11 06/28/2020 0044   CREATININE 1.10 06/28/2020 0044   CALCIUM 8.8 (L) 06/28/2020 0044   PROT 5.2 (L) 06/28/2020 0044   ALBUMIN 2.8 (L) 06/28/2020 0044   AST 27 06/28/2020 0044   ALT 34 06/28/2020 0044   ALKPHOS 50 06/28/2020 0044   BILITOT 0.7 06/28/2020 0044   GFRNONAA >60 06/28/2020 0044   GFRAA >60 05/20/2018 1419    glycosylated hemoglobin  Lipid Panel     Component Value Date/Time   CHOL 167 05/09/2018 1107   TRIG 164 (H) 05/09/2018 1107   HDL 38 (L) 05/09/2018 1107   CHOLHDL 4.4 05/09/2018 1107  CHOLHDL 4.6 07/25/2017 0655   VLDL 55 (H) 07/25/2017 0655   LDLCALC 96 05/09/2018 1107     Imaging I have reviewed images in epic and the results pertinent to this consultation are: Stat CT head with no acute changes.  Aspects 10.  No bleed.   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 He is had right-sided facial weakness noted in the consult note from 06/15/2020 when neurology had initially evaluated him. Other than that, he remains cognitively the same as documented in the prior exam. I do not suspect a new stroke or even if this is a new stroke I am not sure what the timeline of his last known well is and that is why he is not a candidate for tPA. His exam is  not consistent with LVO, that is why I did not pursue vessel imaging. An MRI of the brain might be more beneficial to see if there is any acute changes although I suspect that this is probably related to his vaccine mental mentation from his dementia.  Recommendations:  MRI brain with and without contrast  If it shows a stroke, he will need a full stroke work-up.  He has had encephalopathy work-up completed including spinal tap-that has remained unremarkable-borderline B12 deficiency which she is being supplemented for.  Also completed COVID treatment.  Routine EEG-since he was found altered in his feces at that time with no documented history of seizures but has dementia and prior strokes which can put him at risk for seizures.   Neurology will follow with you Plan was relayed to the bedside RN and the rapid response RN.  -- Amie Portland, MD Neurologist [facility] Pager: [phone]  CRITICAL CARE ATTESTATION Performed by: Amie Portland, MD Total critical care time35 minutes Critical care time was exclusive of separately billable procedures and treating other patients and/or supervising APPs/Residents/Students Critical care was necessary to treat or prevent imminent or life-threatening deterioration due to concern for stroke, toxic metabolic encephalopathy, dementia This patient is critically ill and at significant risk for neurological worsening and/or death and care requires constant monitoring. Critical care was time spent personally by me on the following activities: development of treatment plan with patient and/or surrogate as well as nursing, discussions with consultants, evaluation of patient's response to treatment, examination of patient, obtaining history from patient or surrogate, ordering and performing treatments and interventions, ordering and review of laboratory studies, ordering and review of radiographic studies, pulse oximetry, re-evaluation of patient's  condition, participation in multidisciplinary rounds and medical decision making of high complexity in the care of this patient.

## 2020-06-28 NOTE — Progress Notes (Signed)
PROGRESS NOTE                                                                                                                                                                                                             Patient Demographics:    Patrick Phillips, is a 80 y.o. male, DOB - [birth year], [id number]  Outpatient Primary MD for the patient is [name], MD   Admit date - 06/10/2020   LOS - 66  Chief Complaint  Patient presents with  . Altered Mental Status       Brief Narrative: Patient is a 80 y.o. male with PMHx of vascular dementia, prior CVA, DM-2, HTN-who presented with fever and confusion.  COVID-19 vaccinated status:  Unknown  Significant Events:  4/14>> Admit to [facility] for fever/confusion  Significant studies:  4/14>>Chest x-ray: No PNA 4/14>> CT head: No acute intracranial abnormality. 4/14>> CT C-spine: No fracture 4/14>> MRI brain: No acute infarct. 4/15>> chest x-ray: No PNA 4/15>> CSF: WBC 1 (tube #4), protein 46, glucose 54 4/16>> EEG: Mild to moderate diffuse encephalopathy-no seizures.  COVID-19 medications:  Remdesivir: 4/15>>  Antibiotics:  Vancomycin: 4/14 x1 Ceftriaxone: 4/14 x 1 Ampicillin: 4/14 x 1  Microbiology data:  4/14 >>blood culture: No growth 4/14>> urine culture: No growth 4/15>> blood culture: No growth 4/15>> urine culture: Multiple species 4/15>> CSF culture: No growth 4/15>> CSF PCR HSV 1/2: Negative. 4/15>> CSF HSV culture: Negative 4/15>> CSF arbovirus IgG: Negative 4/15>> CSF VDRL: Negative RMSF CSF serology IgM -ve  Procedures:  4/15>> lumbar puncture in the emergency room  Consults:  Neurology, psychiatry, Pall.Care  DVT prophylaxis:  enoxaparin (LOVENOX) injection 40 mg Start: 06/11/20 0600    Subjective:   Patient in bed, appears comfortable, denies any headache, no fever, no chest pain or pressure, no shortness of breath , no  abdominal pain.    Assessment  & Plan :   Acute metabolic encephalopathy-superimposed on underlying Dementia with delirium: Encephalopathy on presentation was likely related to febrile illness/COVID-19 infection-all culture data negative so far.  CT head, MRI brain and EEG unremarkable. He has had no fever since admission-his encephalopathy I suspect  his confusion/agitation now is related to delirium associated with dementia. Neuro and Psych consulted, Psych requested by me to follow daily.  Evaluated by psychiatry and was initially started on Saphris and Depakote-  Saphris was stopped on 06/23/20 due to somnolence and was replaced by Seroquel, have reduced Depakote dose due to sedation along with Seroquel on 06/25/2020 with less somnolence, continue on the lower dose Seroquel with Depakote.  Note mental status is waxing and waning now some better, have involved palliative care for goals of care as oral intake remains very poor and needs augmentation with IV fluids every few days, which is not a good longterm prognostic sign.   ? Tick Bite: 2 ticks found by nursing staff on patient 06/17/20 - RMSF IgM - ve, DW ID Dr Tommy Medal 06/23/20 - not acute, no Rx.   AKI: Mild-likely hemodynamically mediated-resolved with IV fluids.  Borderline vitamin B12 deficiency: On supplementation.  Hypokalemia: Repleted-recheck periodically.  HTN: Currently on Coreg, Norvasc, added hydralazine for better control.  History of CVA-MRI brain negative for infarct-on aspirin/statin.  Dementia: On Namenda  GERD: On PPI  SIRS-COVID-19 infection: Completed Remdesivir x3 days.  Not hypoxic-no pneumonia-does not require any further therapy.  Interestingly-patient apparently had COVID-19 infection in February as well.  Has finished his COVID isolation on 06/21/2020  Recent Labs    06/26/20 0444 06/27/20 0052 06/28/20 0044  CRP 0.8 0.6 0.6    Lab Results  Component Value Date   SARSCOV2NAA POSITIVE (A) 06/11/2020    Parkman NEGATIVE 01/13/2020   DM-2: CBGs relatively stable-continue SSI-oral hypoglycemic agents on hold  Recent Labs    06/27/20 1739 06/27/20 2122 06/28/20 0740  GLUCAP 184* 123* 136*     Condition -   Guarded  Family Communication  :   Daughter [phone]-updated over the phone on 4/22   Daughter- [phone] at bedside on 4/21, 06/19/20, 06/21/20, 06/23/20, 06/25/2018   Code Status :  Full Code  Diet :  Diet Order            Diet regular Room service appropriate? No; Fluid consistency: Thin  Diet effective now                  Disposition Plan  :   Status is: Inpatient  Remains inpatient appropriate because:Inpatient level of care appropriate due to severity of illness   Dispo: The patient is from: Home              Anticipated d/c is to: Home              Patient currently is not medically stable to d/c.   Difficult to place patient No   Barriers to discharge: Resolving encephalopathy-awaiting final culture data.  Antimicorbials  :    Anti-infectives (From admission, onward)   Start     Dose/Rate Route Frequency Ordered Stop   06/18/20 1745  doxycycline (VIBRAMYCIN) 100 mg in sodium chloride 0.9 % 250 mL IVPB  Status:  Discontinued        100 mg 125 mL/hr over 120 Minutes Intravenous Every 12 hours 06/18/20 1648 06/23/20 0847   06/12/20 1000  remdesivir 100 mg in sodium chloride 0.9 % 100 mL IVPB        100 mg 200 mL/hr over 30 Minutes Intravenous Daily 06/11/20 0435 06/13/20 0900   06/11/20 0445  remdesivir 200 mg in sodium chloride 0.9% 250 mL IVPB        200 mg 580 mL/hr over 30 Minutes Intravenous Once  06/11/20 0434 06/11/20 0929   06/11/20 0230  vancomycin (VANCOREADY) IVPB 1500 mg/300 mL        1,500 mg 150 mL/hr over 120 Minutes Intravenous  Once 06/11/20 0229 06/11/20 0812   06/11/20 0215  ampicillin (OMNIPEN) 2 g in sodium chloride 0.9 % 100 mL IVPB        2 g 300 mL/hr over 20 Minutes Intravenous  Once 06/11/20 0157  06/11/20 0550   06/11/20 0215  vancomycin (VANCOREADY) IVPB 1000 mg/200 mL  Status:  Discontinued        1,000 mg 200 mL/hr over 60 Minutes Intravenous  Once 06/11/20 0157 06/11/20 0229   06/11/20 0200  cefTRIAXone (ROCEPHIN) 2 g in sodium chloride 0.9 % 100 mL IVPB        2 g 200 mL/hr over 30 Minutes Intravenous  Once 06/11/20 0157 06/11/20 0248      Inpatient Medications  Scheduled Meds: . amLODipine  10 mg Oral Daily  . aspirin EC  81 mg Oral Daily  . atorvastatin  40 mg Oral Daily  . carvedilol  6.25 mg Oral BID WC  . divalproex  125 mg Oral Q12H  . enoxaparin (LOVENOX) injection  40 mg Subcutaneous Q24H  . feeding supplement  237 mL Oral BID BM  . hydrALAZINE  50 mg Oral Q8H  . insulin aspart  0-9 Units Subcutaneous TID WC  . multivitamin with minerals  1 tablet Oral Daily  . naLOXone (NARCAN)  injection  0.4 mg Intravenous Once  . pantoprazole  40 mg Oral Daily  . QUEtiapine  25 mg Oral QHS  . senna  2 tablet Oral Daily  . tamsulosin  0.4 mg Oral Daily  . thiamine  100 mg Oral Daily  . vitamin B-12  1,000 mcg Oral Daily   Continuous Infusions: . lactated ringers 75 mL/hr at 06/28/20 0239   PRN Meds:.acetaminophen, haloperidol lactate, hydrALAZINE, [DISCONTINUED] ondansetron **OR** ondansetron (ZOFRAN) IV   Time Spent in minutes  25    See all Orders from today for further details   Lala Lund M.D on 06/28/2020 at 10:36 AM  To page go to [email or web address] - use universal password  [facility] -  Office  [phone]    Objective:   Vitals:   06/27/20 2330 06/28/20 0359 06/28/20 0734 06/28/20 0914  BP: (!) 148/61 140/85 (!) 124/45 (!) 172/72  Pulse:  80  95  Resp:  18    Temp:  98.2 F (36.8 C)    TempSrc:  Oral    SpO2:  98%  100%  Weight:      Height:        Wt Readings from Last 3 Encounters:  06/10/20 86.2 kg  06/10/20 86.2 kg  04/19/20 86.2 kg     Intake/Output Summary (Last 24 hours) at 06/28/2020 1036 Last data filed at 06/28/2020  0914 Gross per 24 hour  Intake 436.23 ml  Output 2725 ml  Net -2288.77 ml     Physical Exam  Awake mildly confused, No new F.N deficits,   [place].AT,PERRAL Supple Neck,No JVD, No cervical lymphadenopathy appriciated.  Symmetrical Chest wall movement, Good air movement bilaterally, CTAB RRR,No Gallops, Rubs or new Murmurs, No Parasternal Heave +ve B.Sounds, Abd Soft, No tenderness, No organomegaly appriciated, No rebound - guarding or rigidity. No Cyanosis, Clubbing or edema, No new Rash or bruise     Data Review:    CBC Recent Labs  Lab 06/22/20 0739 06/23/20 [id number] 06/25/20 0332 06/26/20 0444 06/27/20 0052 06/28/20  0044  WBC 11.6* 10.2 12.1* 8.8 8.9 8.3  HGB 15.1 13.2 12.5* 11.9* 11.3* 11.3*  HCT 46.3 40.5 38.4* 36.2* 34.6* 34.0*  PLT 268 238 222 226 211 232  MCV 86.2 86.5 86.5 86.4 86.3 86.5  MCH 28.1 28.2 28.2 28.4 28.2 28.8  MCHC 32.6 32.6 32.6 32.9 32.7 33.2  RDW 13.9 14.1 13.7 13.8 13.5 13.5  LYMPHSABS 3.0  --  3.9 3.2 3.0 2.7  MONOABS 0.8  --  1.0 0.7 0.7 0.6  EOSABS 0.2  --  0.2 0.2 0.1 0.1  BASOSABS 0.0  --  0.0 0.0 0.0 0.0    Chemistries  Recent Labs  Lab 06/23/20 0420 06/24/20 0725 06/25/20 0332 06/26/20 0444 06/27/20 0052 06/28/20 0044  NA 140 138 140 139 138 138  K 3.7 4.0 3.5 3.5 3.7 3.6  CL 105 105 107 107 105 106  CO2 23 25 26 24 25 25   GLUCOSE 132* 178* 129* 147* 140* 139*  BUN 25* 23 19 18 13 11   CREATININE 1.12 1.10 1.10 1.03 1.03 1.10  CALCIUM 9.6 9.1 9.0 8.8* 8.8* 8.8*  MG 2.2 2.1 2.2 2.1 2.0 2.0  AST 32  --  31 27 31 27   ALT 54*  --  43 37 35 34  ALKPHOS 58  --  54 48 52 50  BILITOT 1.2  --  0.8 0.6 0.5 0.7   ------------------------------------------------------------------------------------------------------------------ No results for input(s): CHOL, HDL, LDLCALC, TRIG, CHOLHDL, LDLDIRECT in the last 72 hours.  Lab Results  Component Value Date   HGBA1C 6.6 (H) 06/11/2020    ------------------------------------------------------------------------------------------------------------------ No results for input(s): TSH, T4TOTAL, T3FREE, THYROIDAB in the last 72 hours.  Invalid input(s): FREET3 ------------------------------------------------------------------------------------------------------------------ No results for input(s): VITAMINB12, FOLATE, FERRITIN, TIBC, IRON, RETICCTPCT in the last 72 hours.  Coagulation profile No results for input(s): INR, PROTIME in the last 168 hours.  No results for input(s): DDIMER in the last 72 hours.  Cardiac Enzymes No results for input(s): CKMB, TROPONINI, MYOGLOBIN in the last 168 hours.  Invalid input(s): CK ------------------------------------------------------------------------------------------------------------------    Component Value Date/Time   BNP 39.4 06/28/2020 0044    Micro Results No results found for this or any previous visit (from the past 240 hour(s)).  Radiology Reports CT Head Wo Contrast  Result Date: 06/10/2020 CLINICAL DATA:  Head and C-spine injury, fall, altered mental status EXAM: CT HEAD WITHOUT CONTRAST CT CERVICAL SPINE WITHOUT CONTRAST TECHNIQUE: Multidetector CT imaging of the head and cervical spine was performed following the standard protocol without intravenous contrast. Multiplanar CT image reconstructions of the cervical spine were also generated. COMPARISON:  05/04/2020 FINDINGS: CT HEAD FINDINGS Brain: Stable mild brain atrophy pattern. No acute intracranial hemorrhage, new mass lesion, acute infarction, midline shift, herniation, hydrocephalus, or extra-axial fluid collection. No focal mass effect or edema. Cisterns are patent. Cerebellar atrophy as well. Vascular: Intracranial atherosclerosis at the skull base. No hyperdense vessel. Skull: Normal. Negative for fracture or focal lesion. Sinuses/Orbits: Minor scattered sinus mucosal thickening as before. Orbits are symmetric. No  acute finding. Other: None. CT CERVICAL SPINE FINDINGS Alignment: Normal. Skull base and vertebrae: No acute fracture. No primary bone lesion or focal pathologic process. Soft tissues and spinal canal: Normal prevertebral soft tissues. No hemorrhage or hematoma appreciated. Carotid atherosclerosis. Disc levels: Similar mild multilevel degenerative disc disease and facet arthropathy. Facets are aligned. No subluxation or dislocation. Preserved vertebral body heights. Upper chest: Negative. Other: None. IMPRESSION: Stable brain atrophy pattern. No acute intracranial abnormality or interval change by noncontrast  CT. Stable mild cervical degenerative changes as above. No acute osseous finding or malalignment by CT. Electronically Signed   By: Jerilynn Mages.  Shick M.D.   On: 06/10/2020 12:31   CT Cervical Spine Wo Contrast  Result Date: 06/10/2020 CLINICAL DATA:  Head and C-spine injury, fall, altered mental status EXAM: CT HEAD WITHOUT CONTRAST CT CERVICAL SPINE WITHOUT CONTRAST TECHNIQUE: Multidetector CT imaging of the head and cervical spine was performed following the standard protocol without intravenous contrast. Multiplanar CT image reconstructions of the cervical spine were also generated. COMPARISON:  05/04/2020 FINDINGS: CT HEAD FINDINGS Brain: Stable mild brain atrophy pattern. No acute intracranial hemorrhage, new mass lesion, acute infarction, midline shift, herniation, hydrocephalus, or extra-axial fluid collection. No focal mass effect or edema. Cisterns are patent. Cerebellar atrophy as well. Vascular: Intracranial atherosclerosis at the skull base. No hyperdense vessel. Skull: Normal. Negative for fracture or focal lesion. Sinuses/Orbits: Minor scattered sinus mucosal thickening as before. Orbits are symmetric. No acute finding. Other: None. CT CERVICAL SPINE FINDINGS Alignment: Normal. Skull base and vertebrae: No acute fracture. No primary bone lesion or focal pathologic process. Soft tissues and spinal  canal: Normal prevertebral soft tissues. No hemorrhage or hematoma appreciated. Carotid atherosclerosis. Disc levels: Similar mild multilevel degenerative disc disease and facet arthropathy. Facets are aligned. No subluxation or dislocation. Preserved vertebral body heights. Upper chest: Negative. Other: None. IMPRESSION: Stable brain atrophy pattern. No acute intracranial abnormality or interval change by noncontrast CT. Stable mild cervical degenerative changes as above. No acute osseous finding or malalignment by CT. Electronically Signed   By: Jerilynn Mages.  Shick M.D.   On: 06/10/2020 12:31   MR Brain Wo Contrast (neuro protocol)  Result Date: 06/10/2020 CLINICAL DATA:  Acute neuro deficit.  Mental status change. EXAM: MRI HEAD WITHOUT CONTRAST TECHNIQUE: Multiplanar, multiecho pulse sequences of the brain and surrounding structures were obtained without intravenous contrast. COMPARISON:  CT head 06/10/2020 FINDINGS: Brain: Ventricle size and cerebral volume normal for age. Negative for acute infarct. No significant chronic ischemic change. Negative for hemorrhage or mass. Image quality degraded by mild motion. Vascular: Partial loss of flow void in the left internal carotid artery through the skull base and the distal left vertebral artery which may be due to slow flow or stenosis. Skull and upper cervical spine: Negative Sinuses/Orbits: Mild mucosal edema paranasal sinuses. No orbital mass. Bilateral exophthalmos. Other: None IMPRESSION: Negative for acute infarct. Partial loss of flow void in the left internal carotid artery distal left vertebral artery which could be due to proximal stenosis. Consider CT angio head and neck for further evaluation. Electronically Signed   By: Franchot Gallo M.D.   On: 06/10/2020 15:09   DG Chest Port 1 View  Result Date: 06/25/2020 CLINICAL DATA:  Shortness of breath and lethargy. EXAM: PORTABLE CHEST 1 VIEW COMPARISON:  Chest x-ray 06/24/2020 FINDINGS: The cardiac silhouette,  mediastinal and hilar contours are within limits. Stable mild tortuosity and calcification the thoracic aorta. The lungs are clear of an acute process. No pleural effusions or pulmonary lesions. The bony thorax is intact. IMPRESSION: No acute cardiopulmonary findings. Electronically Signed   By: Marijo Sanes M.D.   On: 06/25/2020 14:15   DG Chest Port 1 View  Result Date: 06/24/2020 CLINICAL DATA:  Shortness of breath. EXAM: PORTABLE CHEST 1 VIEW COMPARISON:  June 20, 2020. FINDINGS: The heart size and mediastinal contours are within normal limits. Both lungs are clear. The visualized skeletal structures are unremarkable. IMPRESSION: No active disease. Electronically Signed   By:  Marijo Conception M.D.   On: 06/24/2020 08:13   DG Chest Port 1 View  Result Date: 06/20/2020 CLINICAL DATA:  Shortness of breath EXAM: PORTABLE CHEST 1 VIEW COMPARISON:  06/11/2020 FINDINGS: Loop recorder is identified adjacent to the cardiac apex. Normal heart size. No pleural effusion or edema. No airspace densities identified. No acute osseous findings. IMPRESSION: No acute cardiopulmonary abnormalities. Electronically Signed   By: Kerby Moors M.D.   On: 06/20/2020 08:34   DG Chest Port 1 View  Result Date: 06/11/2020 CLINICAL DATA:  Questionable sepsis. EXAM: PORTABLE CHEST 1 VIEW COMPARISON:  June 10, 2020 FINDINGS: There is no evidence of acute infiltrate, pleural effusion or pneumothorax. The heart size and mediastinal contours are within normal limits. A radiopaque loop recorder device is seen. The visualized skeletal structures are unremarkable. IMPRESSION: No active disease. Electronically Signed   By: Virgina Norfolk M.D.   On: 06/11/2020 03:10   DG Chest Port 1 View  Result Date: 06/10/2020 CLINICAL DATA:  Altered mental status. EXAM: PORTABLE CHEST 1 VIEW COMPARISON:  May 25, 2020 FINDINGS: Loop recorder in place. Cardiomediastinal silhouette is normal. Mediastinal contours appear intact. Tortuosity and  calcific atherosclerotic disease of the aorta. There is no evidence of focal airspace consolidation, pleural effusion or pneumothorax. Osseous structures are without acute abnormality. Soft tissues are grossly normal. IMPRESSION: No active disease. Electronically Signed   By: Fidela Salisbury M.D.   On: 06/10/2020 11:15   EEG adult  Result Date: 06/12/2020 Lora Havens, MD     06/12/2020  3:07 PM Patient Name: Tevion Laforge MRN: [id number] Epilepsy Attending: Lora Havens Referring Physician/Provider: Dr Oren Binet Date: 06/12/2020 Duration: 25.42 mins Patient history: 80yo M with ams. EEG to evaluate for seizure Level of alertness: Awake AEDs during EEG study: None Technical aspects: This EEG study was done with scalp electrodes positioned according to the 10-20 International system of electrode placement. Electrical activity was acquired at a sampling rate of 500Hz  and reviewed with a high frequency filter of 70Hz  and a low frequency filter of 1Hz . EEG data were recorded continuously and digitally stored. Description: No posterior dominant rhythm was seen. EEG showed continuous generalized 5 to 6 Hz theta slowing. Hyperventilation and photic stimulation were not performed.   ABNORMALITY - Continuous slow, generalized IMPRESSION: This study is suggestive of mild to moderate diffuse encephalopathy, nonspecific etiology. No seizures or epileptiform discharges were seen throughout the recording. Priyanka Barbra Sarks   DG Hips Bilat W or Wo Pelvis 3-4 Views  Result Date: 06/10/2020 CLINICAL DATA:  Found on side of road, last seen normal at 2130 hours last night, fall, dementia, tenderness of hips bilaterally EXAM: DG HIP (WITH OR WITHOUT PELVIS) 3-4V BILAT COMPARISON:  None FINDINGS: Osseous demineralization. Hip and SI joint spaces preserved. No acute fracture, dislocation, or bone destruction. Mild degenerative disc disease changes at visualized lower lumbar spine. IMPRESSION: No acute osseous  abnormalities. Electronically Signed   By: Lavonia Dana M.D.   On: 06/10/2020 11:48
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
## 2020-06-28 NOTE — Progress Notes (Signed)
Physical Therapy Treatment Patient Details Name: Patrick Phillips MRN: [id number] DOB: [birth year] Today's Date: 06/28/2020    History of Present Illness 80 y.o. male with presented with fever and confusion. Pt with dementia however still highly functional, took dog for a walk and dog came back without him. Pt later found lying on the side of road with no memory of what had happened but able to follow commands, taken to [facility] where MRI was negative for stroke and x-ray negative for fx. D/c home with daughter but pt with increasing confusion pt then brought  to [facility] ED 06/10/20. Pt with increasing confusion and agitation. Pt found to be febrile and COVID+. Admitted for acute metabolic encephalopathy.    PMHx of vascular dementia, prior CVA, DM-2, HTN    PT Comments    Continuing work on functional mobility and activity tolerance;  Much less lethargic today, and seems to want to move; Restless, and with decr safety awareness, as well as difficulty participating and attending to cues related to safety with mobility; Able to stand with mod assist -- braces backs of LEs against bed for stability, which is indicative of high fall risk   Follow Up Recommendations  SNF;Other (comment) (vs memory care)     Equipment Recommendations  None recommended by PT    Recommendations for Other Services       Precautions / Restrictions Precautions Precautions: Fall    Mobility  Bed Mobility Overal bed mobility: Needs Assistance Bed Mobility: Supine to Sit;Sit to Supine     Supine to sit: Mod assist Sit to supine: Mod assist   General bed mobility comments: Pt asking for help to get up upon arrival; Once restraints untied, multimodal cues and mod assist to bring feet and LEs off of bed to sit up; Light mod assist to pull sot upright sitting    Transfers Overall transfer level: Needs assistance Equipment used: 1 person hand held assist Transfers: Sit to/from Stand Sit to Stand: Mod assist          General transfer comment: Stood impulsively from bed with posterior bias and need to brace backs of legs against bed for stability; Lots of cues and assist to sit back down to bed  Ambulation/Gait Ambulation/Gait assistance: Mod assist Gait Distance (Feet):  (sidesteps towards [facility]) Assistive device: 1 person hand held assist (and second person for safety) Gait Pattern/deviations: Shuffle     General Gait Details: took a few steps up toward [place] Mobility    Modified Rankin (Stroke Patients Only)       Balance     Sitting balance-Leahy Scale: Fair       Standing balance-Leahy Scale: Poor                              Cognition Arousal/Alertness: Awake/alert Behavior During Therapy: Restless Overall Cognitive Status: History of cognitive impairments - at baseline Area of Impairment: Attention;Memory;Following commands;Safety/judgement;Awareness;Problem solving                 Orientation Level: Disoriented to;Time;Place;Situation Current Attention Level: Selective Memory: Decreased recall of precautions;Decreased short-term memory Following Commands: Follows one step commands inconsistently Safety/Judgement: Decreased awareness of deficits;Decreased awareness of safety            Exercises      General Comments General comments (skin integrity, edema, etc.): Restless during session,  confabulatory, and unable to follow a liear thought thread; assisted back to bed and restraints reapplied      Pertinent Vitals/Pain Pain Assessment: No/denies pain    Home Living                      Prior Function            PT Goals (current goals can now be found in the care plan section) Acute Rehab PT Goals Patient Stated Goal: Did not state PT Goal Formulation: With patient/family Time For Goal Achievement: 07/12/20 Potential to Achieve Goals: Good Progress towards PT goals: Progressing toward goals  (Goals reassessed -- goals set on 06/12/2020 continue to be appropriate)    Frequency    Min 3X/week      PT Plan Current plan remains appropriate    Co-evaluation              AM-PAC PT "6 Clicks" Mobility   Outcome Measure  Help needed turning from your back to your side while in a flat bed without using bedrails?: None Help needed moving from lying on your back to sitting on the side of a flat bed without using bedrails?: A Little Help needed moving to and from a bed to a chair (including a wheelchair)?: A Lot Help needed standing up from a chair using your arms (e.g., wheelchair or bedside chair)?: A Lot Help needed to walk in hospital room?: A Lot Help needed climbing 3-5 steps with a railing? : Total 6 Click Score: 14    End of Session   Activity Tolerance: Patient tolerated treatment well Patient left: in bed;with call bell/phone within reach;with restraints reapplied;with bed alarm set Nurse Communication: Mobility status PT Visit Diagnosis: Unsteadiness on feet (R26.81);Muscle weakness (generalized) (M62.81)     Time: 3734-2876 PT Time Calculation (min) (ACUTE ONLY): 17 min  Charges:  $Therapeutic Activity: 8-22 mins                     Roney Marion, PT  Acute Rehabilitation Services Pager [phone] Office [place] 06/28/2020, 5:14 PM

## 2020-06-28 NOTE — Progress Notes (Signed)
   Palliative Medicine Inpatient Follow Up Note  Reason for consult:  "severe delirium, demented, poor Po intake, no improvement in weeks. GOC"  HPI:  Per intake H&P --> Patrick Phillips a 79 y.o.malewith medical history significant ofvascular dementia, DM2, HTN. Admitted on 4/15 for altered mental status. Has had a complicated delirium since admission with e/o combativeness.  Palliative care has been asked to get involved in the setting of little improvement since admission.   Today's Discussion (06/28/2020):  *Please note that this is a verbal dictation therefore any spelling or grammatical errors are due to the "[place] [employer]" system interpretation.  Chart reviewed.  Met with Patrick Phillips at bedside this morning.  He was alert and oriented to self though disoriented again on location.  I was able to reorient him appropriately.  Patrick Phillips request to get up this morning as his bottom is hurting.  Was able to help readjust him in the bed.  I provided a brief reprieve from his medical restraints given how tight they were on his wrist.  I shared with Patrick Phillips that physical therapy and Occupational Therapy are going to work with him today for mobility.  He request breakfast in the interim which per conversation with staff he will receive as he receives automatic meal trays.  Questions and concerns addressed   Objective Assessment: Vital Signs Vitals:   06/28/20 0359 06/28/20 0734  BP: 140/85 (!) 124/45  Pulse: 80   Resp: 18   Temp: 98.2 F (36.8 C)   SpO2: 98%     Intake/Output Summary (Last 24 hours) at 06/28/2020 0850 Last data filed at 06/28/2020 0400 Gross per 24 hour  Intake 556.23 ml  Output 1900 ml  Net -1343.77 ml   Last Weight  Most recent update: 06/10/2020 11:13 PM   Weight  86.2 kg (190 lb 0.6 oz)           Gen:  Elderly appearing M in NAD HEENT: moist mucous membranes CV: Regular rate and irregular rhythm  PULM: On RA ABD: soft/nontender  EXT: No edema  Neuro:  Alert and oriented x1  SUMMARY OF RECOMMENDATIONS DNAR/DNI  Patient will require long term placement thereafter if continues to have complicated delirium  TOC - Outpatient palliative ongoing support  Goals from palliative perspective remain clear for patient to go to a [facility] psychiatric facility and then for long-term placement if he remains be altered and a potential threat/harm to self or others  Time Spent: 25 Greater than 50% of the time was spent in counseling and coordination of care ______________________________________________________________________________________ [place] Team Team Cell Phone: [phone] Please utilize secure chat with additional questions, if there is no response within 30 minutes please call the above phone number  Palliative Medicine Team providers are available by phone from 7am to 7pm daily and can be reached through the team cell phone.  Should this patient require assistance outside of these hours, please call the patient's attending physician.

## 2020-06-28 NOTE — TOC Progression Note (Signed)
Transition of Care [facility]) - Progression Note    Patient Details  Name: Patrick Phillips MRN: [id number] Date of Birth: 1940-12-16  Transition of Care [facility]) CM/SW [place], LCSW Phone Number: 06/28/2020, 11:24 AM  Clinical Narrative:    CSW faxing referral to [place], [employer], and [employer].   Expected Discharge Plan: Memory Care Barriers to Discharge: Continued Medical Work up  Expected Discharge Plan and Services Expected Discharge Plan: Memory Care In-house Referral: Clinical Social Work Discharge Planning Services: CM Consult Post Acute Care Choice: Nursing Home Living arrangements for the past 2 months: Single Family Home                                       Social Determinants of Health (SDOH) Interventions    Readmission Risk Interventions No flowsheet data found.

## 2020-06-29 ENCOUNTER — Inpatient Hospital Stay (HOSPITAL_COMMUNITY): Payer: Medicare Other

## 2020-06-29 DIAGNOSIS — Z515 Encounter for palliative care: Secondary | ICD-10-CM | POA: Diagnosis not present

## 2020-06-29 DIAGNOSIS — G9341 Metabolic encephalopathy: Secondary | ICD-10-CM | POA: Diagnosis not present

## 2020-06-29 LAB — GLUCOSE, CAPILLARY
Glucose-Capillary: 215 mg/dL — ABNORMAL HIGH (ref 70–99)
Glucose-Capillary: 120 mg/dL — ABNORMAL HIGH (ref 70–99)
Glucose-Capillary: 182 mg/dL — ABNORMAL HIGH (ref 70–99)
Glucose-Capillary: 225 mg/dL — ABNORMAL HIGH (ref 70–99)
Glucose-Capillary: 179 mg/dL — ABNORMAL HIGH (ref 70–99)

## 2020-06-29 NOTE — Procedures (Signed)
Patient Name: Patrick Phillips  MRN: [id number]  Epilepsy Attending: Lora Havens  Referring Physician/Provider: Dr Amie [place] Date: 06/29/2020 Duration: 21.43 mins  Patient history: 80 year old male with waxing and waning mental status.  EEG to evaluate for seizures.  Level of alertness: Awake  AEDs during EEG study: Depakote  Technical aspects: This EEG study was done with scalp electrodes positioned according to the 10-20 International system of electrode placement. Electrical activity was acquired at a sampling rate of 500Hz  and reviewed with a high frequency filter of 70Hz  and a low frequency filter of 1Hz . EEG data were recorded continuously and digitally stored.   Description: The posterior dominant rhythm consists of 8-9 Hz activity of moderate voltage (25-35 uV) seen predominantly in posterior head regions, symmetric and reactive to eye opening and eye closing. EEG showed intermittent generalized 5 to 6 Hz theta slowing. Hyperventilation and photic stimulation were not performed.     ABNORMALITY - Intermittent slow, generalized  IMPRESSION: This study is suggestive of mild diffuse encephalopathy, nonspecific etiology. No seizures or epileptiform discharges were seen throughout the recording.  Alayah Knouff Barbra Sarks

## 2020-06-29 NOTE — Consult Note (Signed)
Reason for Consult: ''Please follow daily, extremely drowsy on Psych Meds, despite Depakote dose being cut.'  This morning was noted by bedside RN to have Right-Sided facial droop and drooling. Neurology was reconsulted. NIH Stroke Scale was 5. Modified Rankin at time of assessment was 4. Ordered CT Head and MRI Brain W/WO contrast. Both showed no acute abnormalities.   Today he continues to be confused. He is oriented to person only. He does not know his age (72), does not know the year or month, does not know where he is. He thinks that he will die soon. When asked why he thinks this he begins to start talking about needing to do things. He reports that his sleep was ok. He reports that his appetite is not great. He reports no SI, HI, or AVH. He currently does not have soft restraints on.  His 1 to 1 sitter reports that he has been somewhat agitated this morning. After going to the bathroom he initially refused to get back into bed. She reported that he started taking about dying around 8. She reports no acute events that could have caused this.   Diagnosis:Demential with behavioral disturbances.   Treatment Plan: -Acute thoughts of death/ agitation most likely related to the Code Stroke called last night and needing more imaging. This should resolve soon and the sitter will continue to redirect when he says this.   -Continue toRecommend 1:1 Air cabin crew.Safety sitters offer supportive interaction with patient, and can easily identify patient needs, reorient, and address environmental factors that contribute to the delirium.If patient requires restraints, recommend a safety sitter also.  - If patient remains agitated and trying to remove medical monitoring devices or IVs, may need to consider discontinuing hiding unnecessary devices to further decrease delirium and agitation.  -Patient with documented diagnosis of dementia with rapid cognitive decline this admission, it should be  recognized that the level of care he requires is outside his family and caregiver capacities, and he will likely benefit from structured facility.   -Continue recommendations with physical therapy, early mobilization does help with cognitive stimulation and improves delirium.  -Continue Seroquel 25 mg QHS for aggression/behavorial disturbances -Continue Depakote Sprinkle 125 mg BID for aggression/behavoiral disturbances -Continue to Recommend Geropsych inpatient placement once medically stable. Patient does not appear to be responding to traditional pharmacological treatment modalities -Follow up with Social Work per [employer] placement    -His sedation from his medications has resolved. At this point given his acute decline that lead to this hospitalization the cause is most likely Organic in origin as opposed to Psychiatric.   At this point Psychiatry will sign off. Should further issues arise please re-consult. Thank you for allowing [place] to participate in this patients care.   Fatima Sanger MD Resident

## 2020-06-29 NOTE — Progress Notes (Signed)
MRI brain without contrast: 1. No evidence of acute abnormality. 2. Partial loss of flow void in the distal left vertebral artery, similar to prior and possibly due to stenosis. Consider CT angio head and neck to further evaluate. 3. Frontoethmoidal paranasal sinus mucosal thickening.  EEG is pending.   Will hold off on CTA of head and neck as results would not likely change management. On ASA and atorvastatin.   Electronically signed: Dr. Kerney Elbe

## 2020-06-29 NOTE — TOC Progression Note (Addendum)
Transition of Care [facility]) - Progression Note    Patient Details  Name: Patrick Phillips MRN: [id number] Date of Birth: 1940/12/25  [facility] Care [facility]) CM/SW [place], LCSW Phone Number: 06/29/2020, 9:49 AM  Clinical Narrative:    9:49am-CSW received denial from [facility]. [place], [place], [place], and [employer] do not have a determination yet.   4:43pm- [place] denied patient. [facility] requested CSW fax referral again. Left vm for [facility]. Will complete [facility] application.   Expected Discharge Plan: Memory Care Barriers to Discharge: Continued Medical Work up  Expected Discharge Plan and Services Expected Discharge Plan: Memory Care In-house Referral: Clinical Social Work Discharge Planning Services: CM Consult Post Acute Care Choice: Nursing Home Living arrangements for the past 2 months: Single Family Home Expected Discharge Date: 06/29/20                                     Social Determinants of Health (SDOH) Interventions    Readmission Risk Interventions No flowsheet data found.

## 2020-06-29 NOTE — Progress Notes (Addendum)
PROGRESS NOTE                                                                                                                                                                                                             Patient Demographics:    Patrick Phillips, is a 80 y.o. male, DOB - Apr 18, 1940, [id number]  Outpatient Primary MD for the patient is [name], MD   Admit date - 06/10/2020   LOS - 70  Chief Complaint  Patient presents with  . Altered Mental Status       Brief Narrative: Patient is a 80 y.o. male with PMHx of vascular dementia, prior CVA, DM-2, HTN-who presented with fever and confusion.  COVID-19 vaccinated status:  Unknown  Significant Events:  4/14>> Admit to [facility] for fever/confusion  Significant studies:  4/14>>Chest x-ray: No PNA 4/14>> CT head: No acute intracranial abnormality. 4/14>> CT C-spine: No fracture 4/14>> MRI brain: No acute infarct. 4/15>> chest x-ray: No PNA 4/15>> CSF: WBC 1 (tube #4), protein 46, glucose 54 4/16>> EEG: Mild to moderate diffuse encephalopathy-no seizures.  COVID-19 medications:  Remdesivir: 4/15>>  Antibiotics:  Vancomycin: 4/14 x1 Ceftriaxone: 4/14 x 1 Ampicillin: 4/14 x 1  Microbiology data:  4/14 >>blood culture: No growth 4/14>> urine culture: No growth 4/15>> blood culture: No growth 4/15>> urine culture: Multiple species 4/15>> CSF culture: No growth 4/15>> CSF PCR HSV 1/2: Negative. 4/15>> CSF HSV culture: Negative 4/15>> CSF arbovirus IgG: Negative 4/15>> CSF VDRL: Negative RMSF CSF serology IgM -ve  Procedures:  4/15>> lumbar puncture in the emergency room  Consults:  Neurology, psychiatry, Pall.Care  DVT prophylaxis:  enoxaparin (LOVENOX) injection 40 mg Start: 06/11/20 0600    Subjective:   Patient in bed, mildly confused, eating breakfast by himself,  denies any headache or weakness, no shortness of breath.    Assessment  & Plan :   Acute metabolic encephalopathy-superimposed on underlying Dementia with delirium: Encephalopathy on presentation was likely related to febrile illness/COVID-19 infection-all culture data negative so far.  CT head, MRI brain and EEG unremarkable. He has had no fever since admission-his encephalopathy I suspect his confusion/agitation now is related  to delirium associated with dementia. Neuro and Psych consulted, Psych requested by me to follow daily.  Evaluated by psychiatry and was initially started on Saphris and Depakote-  Saphris was stopped on 06/23/20 due to somnolence and was replaced by Seroquel, have reduced Depakote dose due to sedation along with Seroquel on 06/25/2020 with less somnolence, continue on the lower dose Seroquel with Depakote.  Note mental status is waxing and waning now some better, have involved palliative care for goals of care as oral intake remains very poor and needs augmentation with IV fluids every few days, which is not a good longterm prognostic sign.   ? Tick Bite: 2 ticks found by nursing staff on patient 06/17/20 - RMSF IgM - ve, DW ID Dr Tommy Medal 06/23/20 - not acute, no Rx.  AKI: Mild-likely hemodynamically mediated-resolved with IV fluids.  Borderline vitamin B12 deficiency: On supplementation.  Hypokalemia: Repleted-recheck periodically.  HTN: Currently on Coreg, Norvasc, added hydralazine for better control.  History of CVA-MRI brain negative x 2  - non acute-on aspirin/statin. Seen by Neuro, ? Code stroke 06/28/20 PM, doubt a true event, MRI negative again, stable exam, defer any more workup to Neuro.  Dementia: On Namenda  GERD: On PPI  SIRS-COVID-19 infection: Completed Remdesivir x3 days.  Not hypoxic-no pneumonia-does not require any further therapy.  Interestingly-patient apparently had COVID-19 infection in February as well.  Has finished his COVID isolation on 06/21/2020  Recent Labs    06/27/20 0052 06/28/20 0044  CRP  0.6 0.6    Lab Results  Component Value Date   SARSCOV2NAA POSITIVE (A) 06/11/2020   [place] NEGATIVE 01/13/2020   DM-2: CBGs relatively stable-continue SSI-oral hypoglycemic agents on hold  Recent Labs    06/28/20 1854 06/28/20 2145 06/29/20 0748  GLUCAP 204* 153* 215*     Condition -   Guarded  Family Communication  :   Daughter [phone]-updated over the phone on 4/22   Daughter- Brenda-[phone] at bedside on 4/21, 06/19/20, 06/21/20, 06/23/20, 06/25/2018, 06/29/20   Code Status :  Full Code  Diet :  Diet Order            Diet regular Room service appropriate? No; Fluid consistency: Thin  Diet effective now                  Disposition Plan  :   Status is: Inpatient  Remains inpatient appropriate because:Inpatient level of care appropriate due to severity of illness   Dispo: The patient is from: Home              Anticipated d/c is to: Home              Patient currently is not medically stable to d/c.   Difficult to place patient No   Barriers to discharge: Resolving encephalopathy-awaiting final culture data.  Antimicorbials  :    Anti-infectives (From admission, onward)   Start     Dose/Rate Route Frequency Ordered Stop   06/18/20 1745  doxycycline (VIBRAMYCIN) 100 mg in sodium chloride 0.9 % 250 mL IVPB  Status:  Discontinued        100 mg 125 mL/hr over 120 Minutes Intravenous Every 12 hours 06/18/20 1648 06/23/20 0847   06/12/20 1000  remdesivir 100 mg in sodium chloride 0.9 % 100 mL IVPB        100 mg 200 mL/hr over 30 Minutes Intravenous Daily 06/11/20 0435 06/13/20 0900   06/11/20 0445  remdesivir 200 mg in sodium chloride 0.9%  250 mL IVPB        200 mg 580 mL/hr over 30 Minutes Intravenous Once 06/11/20 0434 06/11/20 0929   06/11/20 0230  vancomycin (VANCOREADY) IVPB 1500 mg/300 mL        1,500 mg 150 mL/hr over 120 Minutes Intravenous  Once 06/11/20 0229 06/11/20 0812   06/11/20 0215  ampicillin (OMNIPEN) 2 g in sodium  chloride 0.9 % 100 mL IVPB        2 g 300 mL/hr over 20 Minutes Intravenous  Once 06/11/20 0157 06/11/20 0550   06/11/20 0215  vancomycin (VANCOREADY) IVPB 1000 mg/200 mL  Status:  Discontinued        1,000 mg 200 mL/hr over 60 Minutes Intravenous  Once 06/11/20 0157 06/11/20 0229   06/11/20 0200  cefTRIAXone (ROCEPHIN) 2 g in sodium chloride 0.9 % 100 mL IVPB        2 g 200 mL/hr over 30 Minutes Intravenous  Once 06/11/20 0157 06/11/20 0248      Inpatient Medications  Scheduled Meds: . amLODipine  10 mg Oral Daily  . aspirin EC  81 mg Oral Daily  . atorvastatin  40 mg Oral Daily  . carvedilol  6.25 mg Oral BID WC  . divalproex  125 mg Oral Q12H  . enoxaparin (LOVENOX) injection  40 mg Subcutaneous Q24H  . feeding supplement  237 mL Oral BID BM  . hydrALAZINE  50 mg Oral Q8H  . insulin aspart  0-9 Units Subcutaneous TID WC  . LORazepam  1 mg Intravenous Once  . multivitamin with minerals  1 tablet Oral Daily  . naLOXone (NARCAN)  injection  0.4 mg Intravenous Once  . pantoprazole  40 mg Oral Daily  . QUEtiapine  25 mg Oral QHS  . senna  2 tablet Oral Daily  . tamsulosin  0.4 mg Oral Daily  . thiamine  100 mg Oral Daily  . vitamin B-12  1,000 mcg Oral Daily   Continuous Infusions:  PRN Meds:.acetaminophen, haloperidol lactate, hydrALAZINE, [DISCONTINUED] ondansetron **OR** ondansetron (ZOFRAN) IV   Time Spent in minutes  25    See all Orders from today for further details   Lala Lund M.D on 06/29/2020 at 10:35 AM  To page go to [email or web address] - use universal password  [facility] -  Office  [phone]    Objective:   Vitals:   06/28/20 1900 06/28/20 2132 06/29/20 0653 06/29/20 0819  BP: (!) 153/62 103/81 124/61 (!) 130/46  Pulse: 88 90 90 93  Resp:  20 18   Temp:  98.5 F (36.9 C) 98.2 F (36.8 C)   TempSrc:  Axillary Axillary   SpO2: 96% 99% 96%   Weight:      Height:        Wt Readings from Last 3 Encounters:  06/10/20 86.2 kg  06/10/20  86.2 kg  04/19/20 86.2 kg     Intake/Output Summary (Last 24 hours) at 06/29/2020 1035 Last data filed at 06/29/2020 0801 Gross per 24 hour  Intake 460 ml  Output 675 ml  Net -215 ml     Physical Exam  Awake mildly confused, No new F.N deficits,   [place].AT,PERRAL Supple Neck,No JVD, No cervical lymphadenopathy appriciated.  Symmetrical Chest wall movement, Good air movement bilaterally, CTAB RRR,No Gallops, Rubs or new Murmurs, No Parasternal Heave +ve B.Sounds, Abd Soft, No tenderness, No organomegaly appriciated, No rebound - guarding or rigidity. No Cyanosis, Clubbing or edema, No new Rash or bruise    Data Review:  CBC Recent Labs  Lab 06/23/20 0608 06/25/20 0332 06/26/20 0444 06/27/20 0052 06/28/20 0044  WBC 10.2 12.1* 8.8 8.9 8.3  HGB 13.2 12.5* 11.9* 11.3* 11.3*  HCT 40.5 38.4* 36.2* 34.6* 34.0*  PLT 238 222 226 211 232  MCV 86.5 86.5 86.4 86.3 86.5  MCH 28.2 28.2 28.4 28.2 28.8  MCHC 32.6 32.6 32.9 32.7 33.2  RDW 14.1 13.7 13.8 13.5 13.5  LYMPHSABS  --  3.9 3.2 3.0 2.7  MONOABS  --  1.0 0.7 0.7 0.6  EOSABS  --  0.2 0.2 0.1 0.1  BASOSABS  --  0.0 0.0 0.0 0.0    Chemistries  Recent Labs  Lab 06/23/20 0420 06/24/20 0725 06/25/20 0332 06/26/20 0444 06/27/20 0052 06/28/20 0044  NA 140 138 140 139 138 138  K 3.7 4.0 3.5 3.5 3.7 3.6  CL 105 105 107 107 105 106  CO2 23 25 26 24 25 25   GLUCOSE 132* 178* 129* 147* 140* 139*  BUN 25* 23 19 18 13 11   CREATININE 1.12 1.10 1.10 1.03 1.03 1.10  CALCIUM 9.6 9.1 9.0 8.8* 8.8* 8.8*  MG 2.2 2.1 2.2 2.1 2.0 2.0  AST 32  --  31 27 31 27   ALT 54*  --  43 37 35 34  ALKPHOS 58  --  54 48 52 50  BILITOT 1.2  --  0.8 0.6 0.5 0.7   ------------------------------------------------------------------------------------------------------------------ No results for input(s): CHOL, HDL, LDLCALC, TRIG, CHOLHDL, LDLDIRECT in the last 72 hours.  Lab Results  Component Value Date   HGBA1C 6.6 (H) 06/11/2020    ------------------------------------------------------------------------------------------------------------------ No results for input(s): TSH, T4TOTAL, T3FREE, THYROIDAB in the last 72 hours.  Invalid input(s): FREET3 ------------------------------------------------------------------------------------------------------------------ No results for input(s): VITAMINB12, FOLATE, FERRITIN, TIBC, IRON, RETICCTPCT in the last 72 hours.  Coagulation profile No results for input(s): INR, PROTIME in the last 168 hours.  No results for input(s): DDIMER in the last 72 hours.  Cardiac Enzymes No results for input(s): CKMB, TROPONINI, MYOGLOBIN in the last 168 hours.  Invalid input(s): CK ------------------------------------------------------------------------------------------------------------------    Component Value Date/Time   BNP 39.4 06/28/2020 0044    Micro Results No results found for this or any previous visit (from the past 240 hour(s)).  Radiology Reports CT Head Wo Contrast  Result Date: 06/10/2020 CLINICAL DATA:  Head and C-spine injury, fall, altered mental status EXAM: CT HEAD WITHOUT CONTRAST CT CERVICAL SPINE WITHOUT CONTRAST TECHNIQUE: Multidetector CT imaging of the head and cervical spine was performed following the standard protocol without intravenous contrast. Multiplanar CT image reconstructions of the cervical spine were also generated. COMPARISON:  05/04/2020 FINDINGS: CT HEAD FINDINGS Brain: Stable mild brain atrophy pattern. No acute intracranial hemorrhage, new mass lesion, acute infarction, midline shift, herniation, hydrocephalus, or extra-axial fluid collection. No focal mass effect or edema. Cisterns are patent. Cerebellar atrophy as well. Vascular: Intracranial atherosclerosis at the skull base. No hyperdense vessel. Skull: Normal. Negative for fracture or focal lesion. Sinuses/Orbits: Minor scattered sinus mucosal thickening as before. Orbits are symmetric. No  acute finding. Other: None. CT CERVICAL SPINE FINDINGS Alignment: Normal. Skull base and vertebrae: No acute fracture. No primary bone lesion or focal pathologic process. Soft tissues and spinal canal: Normal prevertebral soft tissues. No hemorrhage or hematoma appreciated. Carotid atherosclerosis. Disc levels: Similar mild multilevel degenerative disc disease and facet arthropathy. Facets are aligned. No subluxation or dislocation. Preserved vertebral body heights. Upper chest: Negative. Other: None. IMPRESSION: Stable brain atrophy pattern. No acute intracranial abnormality or interval change  by noncontrast CT. Stable mild cervical degenerative changes as above. No acute osseous finding or malalignment by CT. Electronically Signed   By: Jerilynn Mages.  Shick M.D.   On: 06/10/2020 12:31   CT Cervical Spine Wo Contrast  Result Date: 06/10/2020 CLINICAL DATA:  Head and C-spine injury, fall, altered mental status EXAM: CT HEAD WITHOUT CONTRAST CT CERVICAL SPINE WITHOUT CONTRAST TECHNIQUE: Multidetector CT imaging of the head and cervical spine was performed following the standard protocol without intravenous contrast. Multiplanar CT image reconstructions of the cervical spine were also generated. COMPARISON:  05/04/2020 FINDINGS: CT HEAD FINDINGS Brain: Stable mild brain atrophy pattern. No acute intracranial hemorrhage, new mass lesion, acute infarction, midline shift, herniation, hydrocephalus, or extra-axial fluid collection. No focal mass effect or edema. Cisterns are patent. Cerebellar atrophy as well. Vascular: Intracranial atherosclerosis at the skull base. No hyperdense vessel. Skull: Normal. Negative for fracture or focal lesion. Sinuses/Orbits: Minor scattered sinus mucosal thickening as before. Orbits are symmetric. No acute finding. Other: None. CT CERVICAL SPINE FINDINGS Alignment: Normal. Skull base and vertebrae: No acute fracture. No primary bone lesion or focal pathologic process. Soft tissues and spinal  canal: Normal prevertebral soft tissues. No hemorrhage or hematoma appreciated. Carotid atherosclerosis. Disc levels: Similar mild multilevel degenerative disc disease and facet arthropathy. Facets are aligned. No subluxation or dislocation. Preserved vertebral body heights. Upper chest: Negative. Other: None. IMPRESSION: Stable brain atrophy pattern. No acute intracranial abnormality or interval change by noncontrast CT. Stable mild cervical degenerative changes as above. No acute osseous finding or malalignment by CT. Electronically Signed   By: Jerilynn Mages.  Shick M.D.   On: 06/10/2020 12:31   MR BRAIN WO CONTRAST  Result Date: 06/29/2020 CLINICAL DATA:  Right-sided facial drooping, altered mental status. EXAM: MRI HEAD WITHOUT CONTRAST TECHNIQUE: Multiplanar, multiecho pulse sequences of the brain and surrounding structures were obtained without intravenous contrast. COMPARISON:  June 10, 2020. FINDINGS: Brain: No acute infarction, hemorrhage, hydrocephalus, extra-axial collection or mass lesion. Dilated perivascular space in inferior right basal ganglia. No substantial white matter disease for patient age. Vascular: Partial loss of flow void in the distal left vertebral artery, which could be due to stenosis. Normal flow void in the visualized proximal aICAs. Skull and upper cervical spine: Normal marrow signal. Sinuses/Orbits: Mild mucosal thickening of the frontal sinuses and moderate mucosal thickening of scattered ethmoid air cells. Inferior left maxillary sinus mucous retention cyst. Bilateral exopthalmos, similar to prior. Other: No mastoid effusions. IMPRESSION: 1. No evidence of acute abnormality. 2. Partial loss of flow void in the distal left vertebral artery, similar to prior and possibly due to stenosis. Consider CT angio head and neck to further evaluate. 3. Frontoethmoidal paranasal sinus mucosal thickening. Electronically Signed   By: Margaretha Sheffield MD   On: 06/29/2020 07:47   MR Brain Wo Contrast  (neuro protocol)  Result Date: 06/10/2020 CLINICAL DATA:  Acute neuro deficit.  Mental status change. EXAM: MRI HEAD WITHOUT CONTRAST TECHNIQUE: Multiplanar, multiecho pulse sequences of the brain and surrounding structures were obtained without intravenous contrast. COMPARISON:  CT head 06/10/2020 FINDINGS: Brain: Ventricle size and cerebral volume normal for age. Negative for acute infarct. No significant chronic ischemic change. Negative for hemorrhage or mass. Image quality degraded by mild motion. Vascular: Partial loss of flow void in the left internal carotid artery through the skull base and the distal left vertebral artery which may be due to slow flow or stenosis. Skull and upper cervical spine: Negative Sinuses/Orbits: Mild mucosal edema paranasal sinuses. No orbital  mass. Bilateral exophthalmos. Other: None IMPRESSION: Negative for acute infarct. Partial loss of flow void in the left internal carotid artery distal left vertebral artery which could be due to proximal stenosis. Consider CT angio head and neck for further evaluation. Electronically Signed   By: Franchot Gallo M.D.   On: 06/10/2020 15:09   DG Chest Port 1 View  Result Date: 06/25/2020 CLINICAL DATA:  Shortness of breath and lethargy. EXAM: PORTABLE CHEST 1 VIEW COMPARISON:  Chest x-ray 06/24/2020 FINDINGS: The cardiac silhouette, mediastinal and hilar contours are within limits. Stable mild tortuosity and calcification the thoracic aorta. The lungs are clear of an acute process. No pleural effusions or pulmonary lesions. The bony thorax is intact. IMPRESSION: No acute cardiopulmonary findings. Electronically Signed   By: Marijo Sanes M.D.   On: 06/25/2020 14:15   DG Chest Port 1 View  Result Date: 06/24/2020 CLINICAL DATA:  Shortness of breath. EXAM: PORTABLE CHEST 1 VIEW COMPARISON:  June 20, 2020. FINDINGS: The heart size and mediastinal contours are within normal limits. Both lungs are clear. The visualized skeletal structures  are unremarkable. IMPRESSION: No active disease. Electronically Signed   By: Marijo Conception M.D.   On: 06/24/2020 08:13   DG Chest Port 1 View  Result Date: 06/20/2020 CLINICAL DATA:  Shortness of breath EXAM: PORTABLE CHEST 1 VIEW COMPARISON:  06/11/2020 FINDINGS: Loop recorder is identified adjacent to the cardiac apex. Normal heart size. No pleural effusion or edema. No airspace densities identified. No acute osseous findings. IMPRESSION: No acute cardiopulmonary abnormalities. Electronically Signed   By: Kerby Moors M.D.   On: 06/20/2020 08:34   DG Chest Port 1 View  Result Date: 06/11/2020 CLINICAL DATA:  Questionable sepsis. EXAM: PORTABLE CHEST 1 VIEW COMPARISON:  June 10, 2020 FINDINGS: There is no evidence of acute infiltrate, pleural effusion or pneumothorax. The heart size and mediastinal contours are within normal limits. A radiopaque loop recorder device is seen. The visualized skeletal structures are unremarkable. IMPRESSION: No active disease. Electronically Signed   By: Virgina Norfolk M.D.   On: 06/11/2020 03:10   DG Chest Port 1 View  Result Date: 06/10/2020 CLINICAL DATA:  Altered mental status. EXAM: PORTABLE CHEST 1 VIEW COMPARISON:  May 25, 2020 FINDINGS: Loop recorder in place. Cardiomediastinal silhouette is normal. Mediastinal contours appear intact. Tortuosity and calcific atherosclerotic disease of the aorta. There is no evidence of focal airspace consolidation, pleural effusion or pneumothorax. Osseous structures are without acute abnormality. Soft tissues are grossly normal. IMPRESSION: No active disease. Electronically Signed   By: Fidela Salisbury M.D.   On: 06/10/2020 11:15   EEG adult  Result Date: 06/12/2020 Lora Havens, MD     06/12/2020  3:07 PM Patient Name: Patrick Phillips MRN: [id number] Epilepsy Attending: Lora Havens Referring Physician/Provider: Dr Oren Binet Date: 06/12/2020 Duration: 25.42 mins Patient history: 80yo M with ams. EEG to  evaluate for seizure Level of alertness: Awake AEDs during EEG study: None Technical aspects: This EEG study was done with scalp electrodes positioned according to the 10-20 International system of electrode placement. Electrical activity was acquired at a sampling rate of 500Hz  and reviewed with a high frequency filter of 70Hz  and a low frequency filter of 1Hz . EEG data were recorded continuously and digitally stored. Description: No posterior dominant rhythm was seen. EEG showed continuous generalized 5 to 6 Hz theta slowing. Hyperventilation and photic stimulation were not performed.   ABNORMALITY - Continuous slow, generalized IMPRESSION: This study is suggestive of  mild to moderate diffuse encephalopathy, nonspecific etiology. No seizures or epileptiform discharges were seen throughout the recording. Priyanka Barbra Sarks   DG Hips Bilat W or Wo Pelvis 3-4 Views  Result Date: 06/10/2020 CLINICAL DATA:  Found on side of road, last seen normal at 2130 hours last night, fall, dementia, tenderness of hips bilaterally EXAM: DG HIP (WITH OR WITHOUT PELVIS) 3-4V BILAT COMPARISON:  None FINDINGS: Osseous demineralization. Hip and SI joint spaces preserved. No acute fracture, dislocation, or bone destruction. Mild degenerative disc disease changes at visualized lower lumbar spine. IMPRESSION: No acute osseous abnormalities. Electronically Signed   By: Lavonia Dana M.D.   On: 06/10/2020 11:48   CT HEAD CODE STROKE WO CONTRAST`  Result Date: 06/28/2020 CLINICAL DATA:  Code stroke.  Confusion.  Altered mental status. EXAM: CT HEAD WITHOUT CONTRAST TECHNIQUE: Contiguous axial images were obtained from the base of the skull through the vertex without intravenous contrast. COMPARISON:  MRI 06/10/2020 FINDINGS: Brain: Age related volume loss. No evidence of old or acute small or large vessel infarction. No mass lesion, hemorrhage, hydrocephalus or extra-axial collection. Dilated perivascular space at the base of the brain.  Vascular: There is atherosclerotic calcification of the major vessels at the base of the brain. Skull: Negative Sinuses/Orbits: Clear/normal Other: None ASPECTS ([place] Stroke Program Early CT Score) - Ganglionic level infarction (caudate, lentiform nuclei, internal capsule, insula, M1-M3 cortex): 7 - Supraganglionic infarction (M4-M6 cortex): 3 Total score (0-10 with 10 being normal): 10 IMPRESSION: 1. Normal head CT for age. Atherosclerotic calcification of the major vessels at base of the brain. 2. ASPECTS is 10 3. These results were communicated to Dr. Rory Percy at 7:18 pmon 5/2/2022by text page via the [facility] messaging system. Electronically Signed   By: Nelson Chimes M.D.   On: 06/28/2020 19:19

## 2020-06-29 NOTE — Progress Notes (Signed)
EEG completed, results pending. 

## 2020-06-29 NOTE — Progress Notes (Signed)
OT Cancellation Note  Patient Details Name: Patrick Phillips MRN: [id number] DOB: June 20, 1940   Cancelled Treatment:    Reason Eval/Treat Not Completed: Other (comment) Planned to see pt at 11AM today for OT session, but EEG called and is coming up to begin with pt. Per RN, may be better to hold on therapy session until after EEG to avoid any increased impulsivity, etc for pt to be as calm as possible for EEG. Will follow-up as able.   Layla Maw 06/29/2020, 10:57 AM

## 2020-06-30 DIAGNOSIS — E440 Moderate protein-calorie malnutrition: Secondary | ICD-10-CM | POA: Insufficient documentation

## 2020-06-30 DIAGNOSIS — R410 Disorientation, unspecified: Secondary | ICD-10-CM | POA: Diagnosis not present

## 2020-06-30 LAB — GLUCOSE, CAPILLARY
Glucose-Capillary: 114 mg/dL — ABNORMAL HIGH (ref 70–99)
Glucose-Capillary: 137 mg/dL — ABNORMAL HIGH (ref 70–99)
Glucose-Capillary: 275 mg/dL — ABNORMAL HIGH (ref 70–99)
Glucose-Capillary: 145 mg/dL — ABNORMAL HIGH (ref 70–99)
Glucose-Capillary: 156 mg/dL — ABNORMAL HIGH (ref 70–99)

## 2020-06-30 MED ORDER — QUETIAPINE FUMARATE 50 MG PO TABS
50.0000 mg | ORAL_TABLET | Freq: Every day | ORAL | Status: DC
  Administered 2020-06-30 – 2020-07-13 (×14): 50 mg via ORAL
  Filled 2020-06-30 (×14): qty 1

## 2020-06-30 MED ORDER — FLUTICASONE PROPIONATE 50 MCG/ACT NA SUSP
2.0000 | Freq: Every day | NASAL | Status: DC
  Administered 2020-06-30 – 2020-07-14 (×13): 2 via NASAL
  Filled 2020-06-30 (×2): qty 16

## 2020-06-30 MED ORDER — HALOPERIDOL LACTATE 5 MG/ML IJ SOLN: 1.0000 mg | Freq: Four times a day (QID) | INTRAMUSCULAR | Status: DC | PRN

## 2020-06-30 MED ORDER — SALINE SPRAY 0.65 % NA SOLN
1.0000 | NASAL | Status: DC | PRN
  Administered 2020-07-08: 1 via NASAL
  Filled 2020-06-30: qty 44

## 2020-06-30 NOTE — TOC Progression Note (Signed)
Transition of Care [facility]) - Progression Note    Patient Details  Name: Anselmo Reihl MRN: [id number] Date of Birth: [birth year]  Transition of Care [facility]) CM/SW [place], LCSW Phone Number: 06/30/2020, 4:34 PM  Clinical Narrative:    CSW contacted [facility] Intake to ensure they received referral; she confirmed they did. CSW completed demographics screening and she stated they will contact CSW back with needed info.    Expected Discharge Plan: Memory Care Barriers to Discharge: Continued Medical Work up  Expected Discharge Plan and Services Expected Discharge Plan: Memory Care In-house Referral: Clinical Social Work Discharge Planning Services: CM Consult Post Acute Care Choice: Nursing Home Living arrangements for the past 2 months: Single Family Home Expected Discharge Date: 06/29/20                                     Social Determinants of Health (SDOH) Interventions    Readmission Risk Interventions No flowsheet data found.

## 2020-06-30 NOTE — Progress Notes (Signed)
PROGRESS NOTE                                                                                                                                                                                                             Patient Demographics:    Patrick Phillips, is a 80 y.o. male, DOB - 1940/05/14, [id number]  Outpatient Primary MD for the patient is [name], MD   Admit date - 06/10/2020   LOS - 64  Chief Complaint  Patient presents with  . Altered Mental Status       Brief Narrative: Patient is a 81 y.o. male with PMHx of vascular dementia, prior CVA, DM-2, HTN-who presented with fever and confusion.  COVID-19 vaccinated status:  Unknown  Significant Events:  4/14>> Admit to [facility] for fever/confusion  Significant studies:  4/14>>Chest x-ray: No PNA 4/14>> CT head: No acute intracranial abnormality. 4/14>> CT C-spine: No fracture 4/14>> MRI brain: No acute infarct. 4/15>> chest x-ray: No PNA 4/15>> CSF: WBC 1 (tube #4), protein 46, glucose 54 4/16>> EEG: Mild to moderate diffuse encephalopathy-no seizures.  COVID-19 medications:  Remdesivir: 4/15>>  Antibiotics:  Vancomycin: 4/14 x1 Ceftriaxone: 4/14 x 1 Ampicillin: 4/14 x 1  Microbiology data:  4/14 >>blood culture: No growth 4/14>> urine culture: No growth 4/15>> blood culture: No growth 4/15>> urine culture: Multiple species 4/15>> CSF culture: No growth 4/15>> CSF PCR HSV 1/2: Negative. 4/15>> CSF HSV culture: Negative 4/15>> CSF arbovirus IgG: Negative 4/15>> CSF VDRL: Negative RMSF CSF serology IgM -ve  Procedures:  4/15>> lumbar puncture in the emergency room  Consults:  Neurology, psychiatry, Pall.Care  DVT prophylaxis:  enoxaparin (LOVENOX) injection 40 mg Start: 06/11/20 0600    Subjective:   Patient in bed, no significant events overnight as discussed with staff, patient denies any dyspnea, cough, fever or chills.   .   Assessment  & Plan :   Acute metabolic encephalopathy-superimposed on underlying Dementia with delirium:  - Encephalopathy on presentation was likely related to febrile illness/COVID-19 infection-all culture data negative so far.  CT head, MRI brain and EEG unremarkable. He has had no fever since admission-his encephalopathy I suspect his  confusion/agitation now is related to delirium associated with dementia. Neuro and Psych consulted, Psych requested to follow daily. - Evaluated by psychiatry and was initially started on Saphris and Depakote-  Saphris was stopped on 06/23/20 due to somnolence and was replaced by Seroquel, have reduced Depakote dose due to sedation along with Seroquel on 06/25/2020 with less somnolence, continue on the lower dose Seroquel with Depakote. - Note mental status is waxing and waning now some better, have involved palliative care for goals of care as oral intake remains very poor and needs augmentation with IV fluids every few days, which is not a good longterm prognostic sign.   ? Tick Bite: 2 ticks found by nursing staff on patient 06/17/20 - RMSF IgM - ve, DW ID Dr Tommy Medal 06/23/20 - not acute, no Rx.  AKI: Mild-likely hemodynamically mediated-resolved with IV fluids.  Borderline vitamin B12 deficiency: On supplementation.  Hypokalemia: Repleted-recheck periodically.  HTN: Currently on Coreg, Norvasc, added hydralazine for better control.  History of CVA-MRI brain negative x 2  - non acute-on aspirin/statin. Seen by Neuro, ? Code stroke 06/28/20 PM, doubt a true event, MRI negative again, stable exam, defer any more workup to Neuro.  Dementia: On Namenda  Moderate protein calorie malnutrition - seen by nutritionist.  GERD: On PPI  SIRS-COVID-19 infection: Completed Remdesivir x3 days.  Not hypoxic-no pneumonia-does not require any further therapy.  Interestingly-patient apparently had COVID-19 infection in February as well.  Has finished his COVID isolation  on 06/21/2020  Recent Labs    06/28/20 0044  CRP 0.6    Lab Results  Component Value Date   SARSCOV2NAA POSITIVE (A) 06/11/2020   [place] NEGATIVE 01/13/2020   DM-2: CBGs relatively stable-continue SSI-oral hypoglycemic agents on hold  Recent Labs    06/30/20 0609 06/30/20 0813 06/30/20 1211  GLUCAP 114* 137* 275*     Condition -   Guarded  Family Communication  : none at bedside  Code Status :  Full Code  Diet :  Diet Order            Diet regular Room service appropriate? No; Fluid consistency: Thin  Diet effective now                  Disposition Plan  :   Status is: Inpatient  Remains inpatient appropriate because:Inpatient level of care appropriate due to severity of illness   Dispo: The patient is from: Home              Anticipated d/c is to: Home              Patient currently is not medically stable to d/c.   Difficult to place patient No   Barriers to discharge: Resolving encephalopathy-awaiting final culture data.  Antimicorbials  :    Anti-infectives (From admission, onward)   Start     Dose/Rate Route Frequency Ordered Stop   06/18/20 1745  doxycycline (VIBRAMYCIN) 100 mg in sodium chloride 0.9 % 250 mL IVPB  Status:  Discontinued        100 mg 125 mL/hr over 120 Minutes Intravenous Every 12 hours 06/18/20 1648 06/23/20 0847   06/12/20 1000  remdesivir 100 mg in sodium chloride 0.9 % 100 mL IVPB        100 mg 200 mL/hr over 30 Minutes Intravenous Daily 06/11/20 0435 06/13/20 0900   06/11/20 0445  remdesivir 200 mg in sodium chloride 0.9% 250 mL IVPB        200 mg  580 mL/hr over 30 Minutes Intravenous Once 06/11/20 0434 06/11/20 0929   06/11/20 0230  vancomycin (VANCOREADY) IVPB 1500 mg/300 mL        1,500 mg 150 mL/hr over 120 Minutes Intravenous  Once 06/11/20 0229 06/11/20 0812   06/11/20 0215  ampicillin (OMNIPEN) 2 g in sodium chloride 0.9 % 100 mL IVPB        2 g 300 mL/hr over 20 Minutes Intravenous  Once 06/11/20 0157  06/11/20 0550   06/11/20 0215  vancomycin (VANCOREADY) IVPB 1000 mg/200 mL  Status:  Discontinued        1,000 mg 200 mL/hr over 60 Minutes Intravenous  Once 06/11/20 0157 06/11/20 0229   06/11/20 0200  cefTRIAXone (ROCEPHIN) 2 g in sodium chloride 0.9 % 100 mL IVPB        2 g 200 mL/hr over 30 Minutes Intravenous  Once 06/11/20 0157 06/11/20 0248      Inpatient Medications  Scheduled Meds: . amLODipine  10 mg Oral Daily  . aspirin EC  81 mg Oral Daily  . atorvastatin  40 mg Oral Daily  . carvedilol  6.25 mg Oral BID WC  . divalproex  125 mg Oral Q12H  . enoxaparin (LOVENOX) injection  40 mg Subcutaneous Q24H  . feeding supplement  237 mL Oral BID BM  . fluticasone  2 spray Each Nare Daily  . hydrALAZINE  50 mg Oral Q8H  . insulin aspart  0-9 Units Subcutaneous TID WC  . LORazepam  1 mg Intravenous Once  . multivitamin with minerals  1 tablet Oral Daily  . naLOXone (NARCAN)  injection  0.4 mg Intravenous Once  . pantoprazole  40 mg Oral Daily  . QUEtiapine  25 mg Oral QHS  . senna  2 tablet Oral Daily  . tamsulosin  0.4 mg Oral Daily  . thiamine  100 mg Oral Daily  . vitamin B-12  1,000 mcg Oral Daily   Continuous Infusions:  PRN Meds:.acetaminophen, haloperidol lactate, hydrALAZINE, [DISCONTINUED] ondansetron **OR** ondansetron (ZOFRAN) IV, sodium chloride   See all Orders from today for further details   Phillips Climes M.D on 06/30/2020 at 3:02 PM  To page go to [email or web address] - use universal password  [facility] -  Office  [phone]    Objective:   Vitals:   06/29/20 2305 06/30/20 0607 06/30/20 0819 06/30/20 1217  BP: (!) 128/35 (!) 111/47 (!) 151/72 138/67  Pulse: 70 66 85 72  Resp: 15 17 18 18   Temp: 98 F (36.7 C) 97.8 F (36.6 C) 97.6 F (36.4 C) 97.9 F (36.6 C)  TempSrc: Axillary Axillary Axillary Axillary  SpO2: 96% 97% 99% 98%  Weight:      Height:        Wt Readings from Last 3 Encounters:  06/10/20 86.2 kg  06/10/20 86.2 kg   04/19/20 86.2 kg     Intake/Output Summary (Last 24 hours) at 06/30/2020 1502 Last data filed at 06/30/2020 1332 Gross per 24 hour  Intake 480 ml  Output --  Net 480 ml     Physical Exam  Awake Alert, confused, easily distracted, sitting in recliner no apparent distress. Symmetrical Chest wall movement, Good air movement bilaterally, CTAB RRR,No Gallops,Rubs or new Murmurs, No Parasternal Heave +ve B.Sounds, Abd Soft, No tenderness, No rebound - guarding or rigidity. No Cyanosis, Clubbing or edema, No new Rash or bruise     Data Review:    CBC Recent Labs  Lab 06/25/20 0332 06/26/20 0444 06/27/20 0052  06/28/20 0044  WBC 12.1* 8.8 8.9 8.3  HGB 12.5* 11.9* 11.3* 11.3*  HCT 38.4* 36.2* 34.6* 34.0*  PLT 222 226 211 232  MCV 86.5 86.4 86.3 86.5  MCH 28.2 28.4 28.2 28.8  MCHC 32.6 32.9 32.7 33.2  RDW 13.7 13.8 13.5 13.5  LYMPHSABS 3.9 3.2 3.0 2.7  MONOABS 1.0 0.7 0.7 0.6  EOSABS 0.2 0.2 0.1 0.1  BASOSABS 0.0 0.0 0.0 0.0    Chemistries  Recent Labs  Lab 06/24/20 0725 06/25/20 0332 06/26/20 0444 06/27/20 0052 06/28/20 0044  NA 138 140 139 138 138  K 4.0 3.5 3.5 3.7 3.6  CL 105 107 107 105 106  CO2 25 26 24 25 25   GLUCOSE 178* 129* 147* 140* 139*  BUN 23 19 18 13 11   CREATININE 1.10 1.10 1.03 1.03 1.10  CALCIUM 9.1 9.0 8.8* 8.8* 8.8*  MG 2.1 2.2 2.1 2.0 2.0  AST  --  31 27 31 27   ALT  --  43 37 35 34  ALKPHOS  --  54 48 52 50  BILITOT  --  0.8 0.6 0.5 0.7   ------------------------------------------------------------------------------------------------------------------ No results for input(s): CHOL, HDL, LDLCALC, TRIG, CHOLHDL, LDLDIRECT in the last 72 hours.  Lab Results  Component Value Date   HGBA1C 6.6 (H) 06/11/2020   ------------------------------------------------------------------------------------------------------------------ No results for input(s): TSH, T4TOTAL, T3FREE, THYROIDAB in the last 72 hours.  Invalid input(s):  FREET3 ------------------------------------------------------------------------------------------------------------------ No results for input(s): VITAMINB12, FOLATE, FERRITIN, TIBC, IRON, RETICCTPCT in the last 72 hours.  Coagulation profile No results for input(s): INR, PROTIME in the last 168 hours.  No results for input(s): DDIMER in the last 72 hours.  Cardiac Enzymes No results for input(s): CKMB, TROPONINI, MYOGLOBIN in the last 168 hours.  Invalid input(s): CK ------------------------------------------------------------------------------------------------------------------    Component Value Date/Time   BNP 39.4 06/28/2020 0044    Micro Results No results found for this or any previous visit (from the past 240 hour(s)).  Radiology Reports CT Head Wo Contrast  Result Date: 06/10/2020 CLINICAL DATA:  Head and C-spine injury, fall, altered mental status EXAM: CT HEAD WITHOUT CONTRAST CT CERVICAL SPINE WITHOUT CONTRAST TECHNIQUE: Multidetector CT imaging of the head and cervical spine was performed following the standard protocol without intravenous contrast. Multiplanar CT image reconstructions of the cervical spine were also generated. COMPARISON:  05/04/2020 FINDINGS: CT HEAD FINDINGS Brain: Stable mild brain atrophy pattern. No acute intracranial hemorrhage, new mass lesion, acute infarction, midline shift, herniation, hydrocephalus, or extra-axial fluid collection. No focal mass effect or edema. Cisterns are patent. Cerebellar atrophy as well. Vascular: Intracranial atherosclerosis at the skull base. No hyperdense vessel. Skull: Normal. Negative for fracture or focal lesion. Sinuses/Orbits: Minor scattered sinus mucosal thickening as before. Orbits are symmetric. No acute finding. Other: None. CT CERVICAL SPINE FINDINGS Alignment: Normal. Skull base and vertebrae: No acute fracture. No primary bone lesion or focal pathologic process. Soft tissues and spinal canal: Normal  prevertebral soft tissues. No hemorrhage or hematoma appreciated. Carotid atherosclerosis. Disc levels: Similar mild multilevel degenerative disc disease and facet arthropathy. Facets are aligned. No subluxation or dislocation. Preserved vertebral body heights. Upper chest: Negative. Other: None. IMPRESSION: Stable brain atrophy pattern. No acute intracranial abnormality or interval change by noncontrast CT. Stable mild cervical degenerative changes as above. No acute osseous finding or malalignment by CT. Electronically Signed   By: Jerilynn Mages.  Shick M.D.   On: 06/10/2020 12:31   CT Cervical Spine Wo Contrast  Result Date: 06/10/2020 CLINICAL DATA:  Head and  C-spine injury, fall, altered mental status EXAM: CT HEAD WITHOUT CONTRAST CT CERVICAL SPINE WITHOUT CONTRAST TECHNIQUE: Multidetector CT imaging of the head and cervical spine was performed following the standard protocol without intravenous contrast. Multiplanar CT image reconstructions of the cervical spine were also generated. COMPARISON:  05/04/2020 FINDINGS: CT HEAD FINDINGS Brain: Stable mild brain atrophy pattern. No acute intracranial hemorrhage, new mass lesion, acute infarction, midline shift, herniation, hydrocephalus, or extra-axial fluid collection. No focal mass effect or edema. Cisterns are patent. Cerebellar atrophy as well. Vascular: Intracranial atherosclerosis at the skull base. No hyperdense vessel. Skull: Normal. Negative for fracture or focal lesion. Sinuses/Orbits: Minor scattered sinus mucosal thickening as before. Orbits are symmetric. No acute finding. Other: None. CT CERVICAL SPINE FINDINGS Alignment: Normal. Skull base and vertebrae: No acute fracture. No primary bone lesion or focal pathologic process. Soft tissues and spinal canal: Normal prevertebral soft tissues. No hemorrhage or hematoma appreciated. Carotid atherosclerosis. Disc levels: Similar mild multilevel degenerative disc disease and facet arthropathy. Facets are aligned. No  subluxation or dislocation. Preserved vertebral body heights. Upper chest: Negative. Other: None. IMPRESSION: Stable brain atrophy pattern. No acute intracranial abnormality or interval change by noncontrast CT. Stable mild cervical degenerative changes as above. No acute osseous finding or malalignment by CT. Electronically Signed   By: Jerilynn Mages.  Shick M.D.   On: 06/10/2020 12:31   MR BRAIN WO CONTRAST  Result Date: 06/29/2020 CLINICAL DATA:  Right-sided facial drooping, altered mental status. EXAM: MRI HEAD WITHOUT CONTRAST TECHNIQUE: Multiplanar, multiecho pulse sequences of the brain and surrounding structures were obtained without intravenous contrast. COMPARISON:  June 10, 2020. FINDINGS: Brain: No acute infarction, hemorrhage, hydrocephalus, extra-axial collection or mass lesion. Dilated perivascular space in inferior right basal ganglia. No substantial white matter disease for patient age. Vascular: Partial loss of flow void in the distal left vertebral artery, which could be due to stenosis. Normal flow void in the visualized proximal aICAs. Skull and upper cervical spine: Normal marrow signal. Sinuses/Orbits: Mild mucosal thickening of the frontal sinuses and moderate mucosal thickening of scattered ethmoid air cells. Inferior left maxillary sinus mucous retention cyst. Bilateral exopthalmos, similar to prior. Other: No mastoid effusions. IMPRESSION: 1. No evidence of acute abnormality. 2. Partial loss of flow void in the distal left vertebral artery, similar to prior and possibly due to stenosis. Consider CT angio head and neck to further evaluate. 3. Frontoethmoidal paranasal sinus mucosal thickening. Electronically Signed   By: Margaretha Sheffield MD   On: 06/29/2020 07:47   MR Brain Wo Contrast (neuro protocol)  Result Date: 06/10/2020 CLINICAL DATA:  Acute neuro deficit.  Mental status change. EXAM: MRI HEAD WITHOUT CONTRAST TECHNIQUE: Multiplanar, multiecho pulse sequences of the brain and  surrounding structures were obtained without intravenous contrast. COMPARISON:  CT head 06/10/2020 FINDINGS: Brain: Ventricle size and cerebral volume normal for age. Negative for acute infarct. No significant chronic ischemic change. Negative for hemorrhage or mass. Image quality degraded by mild motion. Vascular: Partial loss of flow void in the left internal carotid artery through the skull base and the distal left vertebral artery which may be due to slow flow or stenosis. Skull and upper cervical spine: Negative Sinuses/Orbits: Mild mucosal edema paranasal sinuses. No orbital mass. Bilateral exophthalmos. Other: None IMPRESSION: Negative for acute infarct. Partial loss of flow void in the left internal carotid artery distal left vertebral artery which could be due to proximal stenosis. Consider CT angio head and neck for further evaluation. Electronically Signed   By: Juanda Crumble  Carlis Abbott M.D.   On: 06/10/2020 15:09   DG Chest Port 1 View  Result Date: 06/25/2020 CLINICAL DATA:  Shortness of breath and lethargy. EXAM: PORTABLE CHEST 1 VIEW COMPARISON:  Chest x-ray 06/24/2020 FINDINGS: The cardiac silhouette, mediastinal and hilar contours are within limits. Stable mild tortuosity and calcification the thoracic aorta. The lungs are clear of an acute process. No pleural effusions or pulmonary lesions. The bony thorax is intact. IMPRESSION: No acute cardiopulmonary findings. Electronically Signed   By: Marijo Sanes M.D.   On: 06/25/2020 14:15   DG Chest Port 1 View  Result Date: 06/24/2020 CLINICAL DATA:  Shortness of breath. EXAM: PORTABLE CHEST 1 VIEW COMPARISON:  June 20, 2020. FINDINGS: The heart size and mediastinal contours are within normal limits. Both lungs are clear. The visualized skeletal structures are unremarkable. IMPRESSION: No active disease. Electronically Signed   By: Marijo Conception M.D.   On: 06/24/2020 08:13   DG Chest Port 1 View  Result Date: 06/20/2020 CLINICAL DATA:  Shortness of  breath EXAM: PORTABLE CHEST 1 VIEW COMPARISON:  06/11/2020 FINDINGS: Loop recorder is identified adjacent to the cardiac apex. Normal heart size. No pleural effusion or edema. No airspace densities identified. No acute osseous findings. IMPRESSION: No acute cardiopulmonary abnormalities. Electronically Signed   By: Kerby Moors M.D.   On: 06/20/2020 08:34   DG Chest Port 1 View  Result Date: 06/11/2020 CLINICAL DATA:  Questionable sepsis. EXAM: PORTABLE CHEST 1 VIEW COMPARISON:  June 10, 2020 FINDINGS: There is no evidence of acute infiltrate, pleural effusion or pneumothorax. The heart size and mediastinal contours are within normal limits. A radiopaque loop recorder device is seen. The visualized skeletal structures are unremarkable. IMPRESSION: No active disease. Electronically Signed   By: Virgina Norfolk M.D.   On: 06/11/2020 03:10   DG Chest Port 1 View  Result Date: 06/10/2020 CLINICAL DATA:  Altered mental status. EXAM: PORTABLE CHEST 1 VIEW COMPARISON:  May 25, 2020 FINDINGS: Loop recorder in place. Cardiomediastinal silhouette is normal. Mediastinal contours appear intact. Tortuosity and calcific atherosclerotic disease of the aorta. There is no evidence of focal airspace consolidation, pleural effusion or pneumothorax. Osseous structures are without acute abnormality. Soft tissues are grossly normal. IMPRESSION: No active disease. Electronically Signed   By: Fidela Salisbury M.D.   On: 06/10/2020 11:15   EEG adult  Result Date: 06/29/2020 Lora Havens, MD     06/29/2020  1:23 PM Patient Name: Patrick Phillips MRN: [id number] Epilepsy Attending: Lora Havens Referring Physician/Provider: Dr Amie [place] Date: 06/29/2020 Duration: 21.43 mins Patient history: 80 year old male with waxing and waning mental status.  EEG to evaluate for seizures. Level of alertness: Awake AEDs during EEG study: Depakote Technical aspects: This EEG study was done with scalp electrodes positioned according to  the 10-20 International system of electrode placement. Electrical activity was acquired at a sampling rate of 500Hz  and reviewed with a high frequency filter of 70Hz  and a low frequency filter of 1Hz . EEG data were recorded continuously and digitally stored. Description: The posterior dominant rhythm consists of 8-9 Hz activity of moderate voltage (25-35 uV) seen predominantly in posterior head regions, symmetric and reactive to eye opening and eye closing. EEG showed intermittent generalized 5 to 6 Hz theta slowing. Hyperventilation and photic stimulation were not performed.   ABNORMALITY - Intermittent slow, generalized IMPRESSION: This study is suggestive of mild diffuse encephalopathy, nonspecific etiology. No seizures or epileptiform discharges were seen throughout the recording. Priyanka Barbra Sarks  EEG adult  Result Date: 06/12/2020 Lora Havens, MD     06/12/2020  3:07 PM Patient Name: Patrick Phillips MRN: [id number] Epilepsy Attending: Lora Havens Referring Physician/Provider: Dr Oren Binet Date: 06/12/2020 Duration: 25.42 mins Patient history: 80yo M with ams. EEG to evaluate for seizure Level of alertness: Awake AEDs during EEG study: None Technical aspects: This EEG study was done with scalp electrodes positioned according to the 10-20 International system of electrode placement. Electrical activity was acquired at a sampling rate of 500Hz  and reviewed with a high frequency filter of 70Hz  and a low frequency filter of 1Hz . EEG data were recorded continuously and digitally stored. Description: No posterior dominant rhythm was seen. EEG showed continuous generalized 5 to 6 Hz theta slowing. Hyperventilation and photic stimulation were not performed.   ABNORMALITY - Continuous slow, generalized IMPRESSION: This study is suggestive of mild to moderate diffuse encephalopathy, nonspecific etiology. No seizures or epileptiform discharges were seen throughout the recording. Priyanka Barbra Sarks   DG Hips  Bilat W or Wo Pelvis 3-4 Views  Result Date: 06/10/2020 CLINICAL DATA:  Found on side of road, last seen normal at 2130 hours last night, fall, dementia, tenderness of hips bilaterally EXAM: DG HIP (WITH OR WITHOUT PELVIS) 3-4V BILAT COMPARISON:  None FINDINGS: Osseous demineralization. Hip and SI joint spaces preserved. No acute fracture, dislocation, or bone destruction. Mild degenerative disc disease changes at visualized lower lumbar spine. IMPRESSION: No acute osseous abnormalities. Electronically Signed   By: Lavonia Dana M.D.   On: 06/10/2020 11:48   CT HEAD CODE STROKE WO CONTRAST`  Result Date: 06/28/2020 CLINICAL DATA:  Code stroke.  Confusion.  Altered mental status. EXAM: CT HEAD WITHOUT CONTRAST TECHNIQUE: Contiguous axial images were obtained from the base of the skull through the vertex without intravenous contrast. COMPARISON:  MRI 06/10/2020 FINDINGS: Brain: Age related volume loss. No evidence of old or acute small or large vessel infarction. No mass lesion, hemorrhage, hydrocephalus or extra-axial collection. Dilated perivascular space at the base of the brain. Vascular: There is atherosclerotic calcification of the major vessels at the base of the brain. Skull: Negative Sinuses/Orbits: Clear/normal Other: None ASPECTS ([place] Stroke Program Early CT Score) - Ganglionic level infarction (caudate, lentiform nuclei, internal capsule, insula, M1-M3 cortex): 7 - Supraganglionic infarction (M4-M6 cortex): 3 Total score (0-10 with 10 being normal): 10 IMPRESSION: 1. Normal head CT for age. Atherosclerotic calcification of the major vessels at base of the brain. 2. ASPECTS is 10 3. These results were communicated to Dr. Rory Percy at 7:18 pmon 5/2/2022by text page via the [facility] messaging system. Electronically Signed   By: Nelson Chimes M.D.   On: 06/28/2020 19:19

## 2020-06-30 NOTE — Progress Notes (Signed)
SLP Cancellation Note  Patient Details Name: Patrick Phillips MRN: [id number] DOB: Jul 05, 1940   Cancelled treatment:       Reason Eval/Treat Not Completed: Patient at procedure or test/unavailable (Pt currently ambulating with staff. SLP will follow up on subsequent date.)  Ashtyn Meland I. Hardin Negus, [place], [place] Office number [phone] Pager [phone]  Horton Marshall 06/30/2020, 4:29 PM

## 2020-06-30 NOTE — Progress Notes (Signed)
Nutrition Follow-up  DOCUMENTATION CODES:   Non-severe (moderate) malnutrition in context of acute illness/injury  INTERVENTION:   Continue supplements:  Ensure Enlive po BID, each supplement provides 350 kcal and 20 grams of protein  Magic cup BID with meals, each supplement provides 290 kcal and 9 grams of protein  MVI with minerals daily  Encourage intake of meals and supplements.  NUTRITION DIAGNOSIS:   Moderate Malnutrition related to acute illness (COVID) as evidenced by mild fat depletion,mild muscle depletion.  Ongoing  GOAL:   Patient will meet greater than or equal to 90% of their needs   Progressing  MONITOR:   PO intake,Labs,Weight trends,I & O's  REASON FOR ASSESSMENT:   Malnutrition Screening Tool    ASSESSMENT:   Pt admitted with acute metabolic encephalopathy, likely 2/2 febrile illness/COVID-19 infection. PMH includes HTN, DM, Ca, dementia.  Code stroke called 5/2. Neurology is following.  Patient remains confused. He reports that he has a poor appetite and doesn't feel like eating. Opened Ensure bottle sitting in the window sill bedside patient. Sitter in room reports patient has not drank any of the Ensure this morning. He had bacon and toast for breakfast. Unsure if he is receiving magic cups with meals.  He is on a regular diet. Meal intakes: 50-100% (average 63% for the past 6 meals documented)  Labs and medications reviewed. CBG's: 114-137 this morning Psychiatry recommends inpatient geropsych referral.    Nutrition Focused Physical Exam  Flowsheet Row Most Recent Value  Orbital Region Mild depletion  Upper Arm Region Moderate depletion  Thoracic and Lumbar Region Unable to assess  Buccal Region Mild depletion  Temple Region Mild depletion  Clavicle Bone Region Mild depletion  Clavicle and Acromion Bone Region Mild depletion  Scapular Bone Region Mild depletion  Dorsal Hand Mild depletion  Patellar Region Unable to assess   Anterior Thigh Region Unable to assess  Posterior Calf Region Moderate depletion  Edema (RD Assessment) None  Hair Reviewed  Eyes Reviewed  Mouth Reviewed  Skin Reviewed  Nails Reviewed      Diet Order:   Diet Order            Diet regular Room service appropriate? No; Fluid consistency: Thin  Diet effective now                 EDUCATION NEEDS:   No education needs have been identified at this time  Skin:  Skin Assessment: Skin Integrity Issues: Skin Integrity Issues:: Other (Comment) Other: MASD to anus, buttocks, scrotum; non pressure wound to L elbow  Last BM:  5/2 type 1  Height:   Ht Readings from Last 1 Encounters:  06/10/20 6' (1.829 m)    Weight:   Wt Readings from Last 1 Encounters:  06/10/20 86.2 kg   BMI:  Body mass index is 25.77 kg/m.  Estimated Nutritional Needs:   Kcal:  1950-2150  Protein:  100-110 grams  Fluid:  >1.95L/d   Lucas Mallow, RD, LDN, CNSC Please refer to Amion for contact information.

## 2020-06-30 NOTE — TOC Progression Note (Addendum)
Transition of Care [facility]) - Progression Note    Patient Details  Name: Patrick Phillips MRN: [id number] Date of Birth: 1940/06/22  Transition of Care [facility]) CM/SW [place] Phone Number: 06/30/2020, 1:40 PM  Clinical Narrative:     CSW called [facility] to inquire about update on Geripsych referral; no answer, left voicemail requesting return call.  Expected Discharge Plan: Memory Care Barriers to Discharge: Continued Medical Work up  Expected Discharge Plan and Services Expected Discharge Plan: Memory Care In-house Referral: Clinical Social Work Discharge Planning Services: CM Consult Post Acute Care Choice: Nursing Home Living arrangements for the past 2 months: Single Family Home Expected Discharge Date: 06/29/20                                     Social Determinants of Health (SDOH) Interventions    Readmission Risk Interventions No flowsheet data found.

## 2020-06-30 NOTE — Progress Notes (Signed)
Physical Therapy Treatment Patient Details Name: Patrick Phillips MRN: [id number] DOB: November 06, 1940 Today's Date: 06/30/2020    History of Present Illness 80 y.o. male with presented with fever and confusion. Pt with dementia however still highly functional, took dog for a walk and dog came back without him. Pt later found lying on the side of road with no memory of what had happened but able to follow commands, taken to [facility] where MRI was negative for stroke and x-ray negative for fx. D/c home with daughter but pt with increasing confusion pt then brought  to [facility] ED 06/10/20. Pt with increasing confusion and agitation. Pt found to be febrile and COVID+. Admitted for acute metabolic encephalopathy.    PMHx of vascular dementia, prior CVA, DM-2, HTN    PT Comments    Pt progressing well toward goals.  Pt now generally mellow and participative.  Per daughter, pt is not at baseline function and needs work on gait stability, stride and speed.  Emphasized this during gait training today.    Follow Up Recommendations  SNF;Other (comment) (Working to get pt home if he clears mentally toward baseline.)     Equipment Recommendations  None recommended by PT    Recommendations for Other Services       Precautions / Restrictions Precautions Precautions: Fall    Mobility  Bed Mobility               General bed mobility comments: OOB in the chair on arrival    Transfers Overall transfer level: Needs assistance Equipment used: None Transfers: Sit to/from Stand Sit to Stand: Min guard         General transfer comment: cues for safer hand placement for descent.  Ambulation/Gait Ambulation/Gait assistance: Min guard;Min assist Gait Distance (Feet): 250 Feet Assistive device: None Gait Pattern/deviations: Step-through pattern Gait velocity: slowed Gait velocity interpretation: 1.31 - 2.62 ft/sec, indicative of limited community ambulator General Gait Details: short, low amplitude  steps, generally stiff with flexed posture.  Pt's daughter reports this is NOT baseline.   Stairs             Wheelchair Mobility    Modified Rankin (Stroke Patients Only)       Balance Overall balance assessment: Needs assistance Sitting-balance support: No upper extremity supported;Feet supported Sitting balance-Leahy Scale: Good     Standing balance support: No upper extremity supported;During functional activity Standing balance-Leahy Scale: Fair                              Cognition Arousal/Alertness: Awake/alert Behavior During Therapy: Restless;WFL for tasks assessed/performed Overall Cognitive Status: History of cognitive impairments - at baseline                             Awareness: Emergent Problem Solving: Requires verbal cues;Requires tactile cues;Difficulty sequencing;Slow processing        Exercises      General Comments General comments (skin integrity, edema, etc.): dtr present.  VSS.  Dtr gave insight into pt's baseline activity level.      Pertinent Vitals/Pain Pain Assessment: Faces Faces Pain Scale: No hurt Pain Intervention(s): Monitored during session    Home Living                      Prior Function            PT Goals (current  goals can now be found in the care plan section) Acute Rehab PT Goals Patient Stated Goal: Did not state PT Goal Formulation: With patient/family Time For Goal Achievement: 07/12/20 Potential to Achieve Goals: Good Progress towards PT goals: Progressing toward goals    Frequency    Min 3X/week      PT Plan Current plan remains appropriate    Co-evaluation              AM-PAC PT "6 Clicks" Mobility   Outcome Measure  Help needed turning from your back to your side while in a flat bed without using bedrails?: None Help needed moving from lying on your back to sitting on the side of a flat bed without using bedrails?: A Little Help needed moving to  and from a bed to a chair (including a wheelchair)?: A Little Help needed standing up from a chair using your arms (e.g., wheelchair or bedside chair)?: A Little Help needed to walk in hospital room?: A Little Help needed climbing 3-5 steps with a railing? : A Little 6 Click Score: 19    End of Session Equipment Utilized During Treatment: Gait belt Activity Tolerance: Patient tolerated treatment well Patient left: in chair;with call bell/phone within reach;with family/visitor present Nurse Communication: Mobility status PT Visit Diagnosis: Other abnormalities of gait and mobility (R26.89);Muscle weakness (generalized) (M62.81)     Time: 4196-2229 PT Time Calculation (min) (ACUTE ONLY): 14 min  Charges:  $Gait Training: 8-22 mins                     06/30/2020  Ginger Carne., PT Acute Rehabilitation Services [phone]  (pager) [phone]  (office)   Tessie Fass Cabela Pacifico 06/30/2020, 5:58 PM

## 2020-06-30 NOTE — Progress Notes (Signed)
Occupational Therapy Treatment Patient Details Name: Patrick Phillips MRN: [id number] DOB: October 18, 1940 Today's Date: 06/30/2020    History of present illness 80 y.o. male with presented with fever and confusion. Pt with dementia however still highly functional, took dog for a walk and dog came back without him. Pt later found lying on the side of road with no memory of what had happened but able to follow commands, taken to [facility] where MRI was negative for stroke and x-ray negative for fx. D/c home with daughter but pt with increasing confusion pt then brought  to [facility] ED 06/10/20. Pt with increasing confusion and agitation. Pt found to be febrile and COVID+. Admitted for acute metabolic encephalopathy.    PMHx of vascular dementia, prior CVA, DM-2, HTN   OT comments  Pt with mellow affect today and demonstrated much improved functional abilities this AM. Pt able to mobilize in hallway with Min A via handheld assist, cues for larger steps to decrease fall risk. Pt able to brush hair standing at sink with min guard, Min A required for multi-step ADL tasks such as brushing teeth today. Pt continues to benefit from multimodal cues for initiation and able to be redirected during tasks easily during session. DC rec remains appropriate.    Follow Up Recommendations  SNF;Supervision/Assistance - 24 hour;Other (comment) (vs memory care)    Equipment Recommendations  None recommended by OT    Recommendations for Other Services      Precautions / Restrictions Precautions Precautions: Fall Precaution Comments: found down along side of road; h/o combativeness with dementia Restrictions Weight Bearing Restrictions: No       Mobility Bed Mobility               General bed mobility comments: received in chair    Transfers Overall transfer level: Needs assistance Equipment used: 1 person hand held assist Transfers: Sit to/from Stand Sit to Stand: Min guard         General transfer  comment: min guard for sit to stand quickly from recliner chair, no LOB    Balance Overall balance assessment: Needs assistance Sitting-balance support: Feet supported;No upper extremity supported Sitting balance-Leahy Scale: Good     Standing balance support: Single extremity supported;No upper extremity supported;During functional activity Standing balance-Leahy Scale: Fair Standing balance comment: fair static standing but need for one UE support during mobility                           ADL either performed or assessed with clinical judgement   ADL Overall ADL's : Needs assistance/impaired     Grooming: Minimal assistance;Sitting;Standing;Oral care;Brushing hair Grooming Details (indicate cue type and reason): Able to stand at sink to brush hair with min guard and cues for problem solving (using faucet to wet comb). With multi step ADLs like brushing teeth, pt required Min A (to initiate and place toothpaste on toothbrush) but able to complete task with emerging awareness (of when to spit, etc)                             Functional mobility during ADLs: Minimal assistance;Cueing for safety;Cueing for sequencing General ADL Comments: Much improved attention, following of commands today. mellow affect and able to mobilize out in hallway today     Vision   Vision Assessment?: No apparent visual deficits   Perception     Praxis  Cognition Arousal/Alertness: Awake/alert Behavior During Therapy: WFL for tasks assessed/performed;Restless;Flat affect Overall Cognitive Status: History of cognitive impairments - at baseline Area of Impairment: Attention;Memory;Following commands;Safety/judgement;Awareness;Problem solving;Orientation                 Orientation Level: Disoriented to;Time;Place;Situation Current Attention Level: Selective Memory: Decreased recall of precautions;Decreased short-term memory Following Commands: Follows one step  commands with increased time Safety/Judgement: Decreased awareness of deficits;Decreased awareness of safety Awareness: Emergent Problem Solving: Requires verbal cues;Requires tactile cues;Difficulty sequencing;Slow processing General Comments: mellow today, able to follow one step commands well, improving awareness (once standing at mirror, looked at hair and reported need for hair cut). Assistance to initiate tasks but improved attention. Able to state he was at hospital (but reported the one in [place])        Exercises     Shoulder Instructions       General Comments Sitter present during session. Picked at IV once but easily redirected during session today    Pertinent Vitals/ Pain       Pain Assessment: Faces Faces Pain Scale: No hurt  Home Living                                          Prior Functioning/Environment              Frequency  Min 2X/week        Progress Toward Goals  OT Goals(current goals can now be found in the care plan section)  Progress towards OT goals: Progressing toward goals  Acute Rehab OT Goals Patient Stated Goal: Did not state OT Goal Formulation: Patient unable to participate in goal setting Time For Goal Achievement: 07/14/20 Potential to Achieve Goals: Fair ADL Goals Pt Will Perform Grooming: with supervision;standing Pt Will Perform Upper Body Bathing: with supervision;sitting;with caregiver independent in assisting Pt Will Perform Lower Body Bathing: with supervision;sitting/lateral leans;sit to/from stand;with caregiver independent in assisting Pt Will Perform Upper Body Dressing: with supervision;sitting Pt Will Perform Lower Body Dressing: with supervision;sitting/lateral leans;sit to/from stand;with caregiver independent in assisting Pt Will Transfer to Toilet: with supervision;ambulating Pt Will Perform Toileting - Clothing Manipulation and hygiene: with supervision;sitting/lateral leans;sit  to/from stand;with caregiver independent in assisting Pt Will Perform Tub/Shower Transfer: with min guard assist;with supervision;ambulating;with caregiver independent in assisting;shower seat  Plan Discharge plan remains appropriate    Co-evaluation                 AM-PAC OT "6 Clicks" Daily Activity     Outcome Measure   Help from another person eating meals?: A Little Help from another person taking care of personal grooming?: A Little Help from another person toileting, which includes using toliet, bedpan, or urinal?: A Lot Help from another person bathing (including washing, rinsing, drying)?: A Lot Help from another person to put on and taking off regular upper body clothing?: A Little Help from another person to put on and taking off regular lower body clothing?: A Lot 6 Click Score: 15    End of Session Equipment Utilized During Treatment: Gait belt  OT Visit Diagnosis: Unsteadiness on feet (R26.81);Other abnormalities of gait and mobility (R26.89);History of falling (Z91.81);Muscle weakness (generalized) (M62.81);Other symptoms and signs involving cognitive function   Activity Tolerance Patient tolerated treatment well   Patient Left in chair;with call bell/phone within reach;with nursing/sitter in room   Nurse Communication Mobility status  Time: 4967-5916 OT Time Calculation (min): 20 min  Charges: OT General Charges $OT Visit: 1 Visit OT Treatments $Self Care/Home Management : 8-22 mins  Malachy Chamber, OTR/L Acute Rehab Services Office: [phone]   Layla Maw 06/30/2020, 10:09 AM

## 2020-07-01 DIAGNOSIS — R410 Disorientation, unspecified: Secondary | ICD-10-CM | POA: Diagnosis not present

## 2020-07-01 LAB — GLUCOSE, CAPILLARY
Glucose-Capillary: 161 mg/dL — ABNORMAL HIGH (ref 70–99)
Glucose-Capillary: 228 mg/dL — ABNORMAL HIGH (ref 70–99)
Glucose-Capillary: 217 mg/dL — ABNORMAL HIGH (ref 70–99)
Glucose-Capillary: 95 mg/dL (ref 70–99)

## 2020-07-01 NOTE — Progress Notes (Signed)
Physical Therapy Treatment Patient Details Name: Patrick Phillips MRN: [id number] DOB: 09/10/1940 Today's Date: 07/01/2020    History of Present Illness 80 y.o. male with presented with fever and confusion. Pt with dementia however still highly functional, took dog for a walk and dog came back without him. Pt later found lying on the side of road with no memory of what had happened but able to follow commands, taken to [facility] where MRI was negative for stroke and x-ray negative for fx. D/c home with daughter but pt with increasing confusion pt then brought  to [facility] ED 06/10/20. Pt with increasing confusion and agitation. Pt found to be febrile and COVID+. Admitted for acute metabolic encephalopathy.    PMHx of vascular dementia, prior CVA, DM-2, HTN    PT Comments    Pt motivated to ambulate in hallways. Pt continues to have balance and gait deficits requiring min guard-min A for safety. Pt continues to have confusion and requiring cueing for safety. Pt will benefit from skilled PT to address deficits in balance, strength, coordination, gait and safety to maximize independence with functional mobility prior to discharge.     Follow Up Recommendations  SNF;Other (comment) (Working to get pt home if he clears mentally toward baseline.)     Equipment Recommendations  None recommended by PT    Recommendations for Other Services       Precautions / Restrictions Precautions Precautions: Fall Precaution Comments: found down along side of road; h/o combativeness with dementia Restrictions Weight Bearing Restrictions: No    Mobility  Bed Mobility Overal bed mobility: Needs Assistance Bed Mobility: Sit to Supine       Sit to supine: Supervision        Transfers Overall transfer level: Needs assistance Equipment used: None Transfers: Sit to/from Stand Sit to Stand: Supervision            Ambulation/Gait Ambulation/Gait assistance: Min guard;Min assist Gait Distance (Feet): 400  Feet Assistive device: None   Gait velocity: slowed   General Gait Details: forward flexed posture, short shuffling gait pattern, increased movement R and L; performd 2 bouts of ambulation   Stairs             Wheelchair Mobility    Modified Rankin (Stroke Patients Only)       Balance           Standing balance support: No upper extremity supported;During functional activity Standing balance-Leahy Scale: Fair                              Cognition Arousal/Alertness: Awake/alert Behavior During Therapy: WFL for tasks assessed/performed Overall Cognitive Status: History of cognitive impairments - at baseline                                        Exercises      General Comments General comments (skin integrity, edema, etc.): VSS on RA      Pertinent Vitals/Pain Pain Assessment: No/denies pain Faces Pain Scale: No hurt    Home Living                      Prior Function            PT Goals (current goals can now be found in the care plan section) Acute Rehab PT Goals Patient Stated  Goal: I want to see my dog PT Goal Formulation: With patient Time For Goal Achievement: 07/12/20 Potential to Achieve Goals: Good Progress towards PT goals: Progressing toward goals    Frequency    Min 3X/week      PT Plan Current plan remains appropriate    Phillips-evaluation              AM-PAC PT "6 Clicks" Mobility   Outcome Measure  Help needed turning from your back to your side while in a flat bed without using bedrails?: None Help needed moving from lying on your back to sitting on the side of a flat bed without using bedrails?: None Help needed moving to and from a bed to a chair (including a wheelchair)?: A Little Help needed standing up from a chair using your arms (e.g., wheelchair or bedside chair)?: A Little Help needed to walk in hospital room?: A Little Help needed climbing 3-5 steps with a railing? : A  Little 6 Click Score: 20    End of Session Equipment Utilized During Treatment: Gait belt Activity Tolerance: Patient tolerated treatment well Patient left: in chair;with call bell/phone within reach;with nursing/sitter in room Nurse Communication: Mobility status PT Visit Diagnosis: Other abnormalities of gait and mobility (R26.89);Muscle weakness (generalized) (M62.81)     Time: 8416-6063 PT Time Calculation (min) (ACUTE ONLY): 18 min  Charges:  $Gait Training: 8-22 mins                     Patrick Phillips, DPT Acute Rehabilitation Services [phone]   Patrick Phillips 07/01/2020, 2:49 PM

## 2020-07-01 NOTE — Progress Notes (Signed)
  Speech Language Pathology Treatment: Dysphagia  Patient Details Name: Patrick Phillips MRN: [id number] DOB: [birth year] Today's Date: 07/01/2020 Time: 1340-1350 SLP Time Calculation (min) (ACUTE ONLY): 10.08 min  Assessment / Plan / Recommendation Clinical Impression  Pt was seen for dysphagia treatment with NT in room as sitter. NT reported that the pt has been tolerating solids without overt s/sx of aspiration. Multiple cups with straws were noted in his room despite posted signs and education was provided to NT. Pt reported the sensation of food "sticking" during lunch and he identified his pharynx as the site of sensation. Pt continues to be symptomatic of aspiration with thin liquids via straw, but no s/sx of aspiration were noted via cup. Mastication and oral clearance were adequate. A modified barium swallow study will be scheduled for tomorrow to further assess swallow function.    HPI HPI: Pt is a 80 y.o. male with PMHx of vascular dementia, CVA, DM-2, HTN. On 4/14, pt took his dog for a walk and the dog returned home without him. Pt was found lying on the side of road with no memory of what had happened but able to follow commands. Pt was taken to [facility] and d/c home with daughter following negative workup, but pt was brough to [facility] ED due to increased confusion. Pt found to be febrile and COVID+ and admitted for acute metabolic encephalopathy. MRI brain 4/14 negative for acute changes. CXR 4/15 negative for active disease. EEG 4/16: mild to moderate diffuse encephalopathy, nonspecific etiology. No seizures or epileptiform discharges. Psych consulted due to severe delirium.      SLP Plan  Continue with current plan of care       Recommendations  Diet recommendations: Dysphagia 3 (mechanical soft);Thin liquid Liquids provided via: Cup;No straw Medication Administration: Whole meds with puree Supervision: Staff to assist with self feeding Compensations: Minimize environmental distractions;Slow  rate;Small sips/bites Postural Changes and/or Swallow Maneuvers: Seated upright 90 degrees                Oral Care Recommendations: Oral care BID SLP Visit Diagnosis: Dysphagia, unspecified (R13.10) Plan: Continue with current plan of care       Dajae Kizer I. Hardin Negus, [place], [place] Office number [phone] Pager [place] 07/01/2020, 1:59 PM

## 2020-07-01 NOTE — Progress Notes (Signed)
PROGRESS NOTE                                                                                                                                                                                                             Patient Demographics:    Patrick Phillips, is a 80 y.o. male, DOB - Nov 14, 1940, WUJ:[id number]  Outpatient Primary MD for the patient is [name], MD   Admit date - 06/10/2020   LOS - 35  Chief Complaint  Patient presents with  . Altered Mental Status       Brief Narrative: Patient is a 80 y.o. male with PMHx of vascular dementia, prior CVA, DM-2, HTN-who presented with fever and confusion.  COVID-19 vaccinated status:  Unknown  Significant Events:  4/14>> Admit to [facility] for fever/confusion  Significant studies:  4/14>>Chest x-ray: No PNA 4/14>> CT head: No acute intracranial abnormality. 4/14>> CT C-spine: No fracture 4/14>> MRI brain: No acute infarct. 4/15>> chest x-ray: No PNA 4/15>> CSF: WBC 1 (tube #4), protein 46, glucose 54 4/16>> EEG: Mild to moderate diffuse encephalopathy-no seizures.  COVID-19 medications:  Remdesivir: 4/15>>  Antibiotics:  Vancomycin: 4/14 x1 Ceftriaxone: 4/14 x 1 Ampicillin: 4/14 x 1  Microbiology data:  4/14 >>blood culture: No growth 4/14>> urine culture: No growth 4/15>> blood culture: No growth 4/15>> urine culture: Multiple species 4/15>> CSF culture: No growth 4/15>> CSF PCR HSV 1/2: Negative. 4/15>> CSF HSV culture: Negative 4/15>> CSF arbovirus IgG: Negative 4/15>> CSF VDRL: Negative RMSF CSF serology IgM -ve  Procedures:  4/15>> lumbar puncture in the emergency room  Consults:  Neurology, psychiatry, Pall.Care  DVT prophylaxis:  enoxaparin (LOVENOX) injection 40 mg Start: 06/11/20 0600    Subjective:   Patient in bed, with no significant events overnight as discussed with staff, he denies any complaints.      Assessment  &  Plan :   Acute metabolic encephalopathy-superimposed on underlying Dementia with delirium:  - Encephalopathy on presentation was likely related to febrile illness/COVID-19 infection-all culture data negative so far.  CT head, MRI brain and EEG unremarkable. He has had no fever since admission-his encephalopathy I suspect his confusion/agitation now  is related to delirium associated with dementia. Neuro and Psych consulted. -Patient with some significant delirium during the day yesterday, did require one-time p.o. Haldol, I have increased her Seroquel to 50 nightly, continue with Depakote 125 twice daily, so far seems he is having a good day today. - Evaluated by psychiatry and was initially started on Saphris and Depakote-  Saphris was stopped on 06/23/20 due to somnolence and was replaced by Seroquel, have reduced Depakote dose due to sedation along with Seroquel on 06/25/2020 with less somnolence, continue on the lower dose Seroquel with Depakote. -EEG with no evidence of seizure, just significant for encephalopathic. - Note mental status is waxing and waning now some better, have involved palliative care for goals of care as oral intake remains very poor and needs augmentation with IV fluids every few days, which is not a good longterm prognostic sign.   ? Tick Bite: 2 ticks found by nursing staff on patient 06/17/20 - RMSF IgM - ve, DW ID Dr Tommy Medal 06/23/20 - not acute, no Rx.  AKI: Mild-likely hemodynamically mediated-resolved with IV fluids.  Borderline vitamin B12 deficiency: On supplementation.  Hypokalemia: Repleted-recheck periodically.  HTN: Currently on Coreg, Norvasc, added hydralazine for better control.  History of CVA-MRI brain negative x 2  - non acute-on aspirin/statin. Seen by Neuro, ? Code stroke 06/28/20 PM, doubt a true event, MRI negative again, stable exam, defer any more workup to Neuro.  Dementia: On Namenda  Moderate protein calorie malnutrition - seen by  nutritionist.  GERD: On PPI  SIRS-COVID-19 infection: Completed Remdesivir x3 days.  Not hypoxic-no pneumonia-does not require any further therapy.  Interestingly-patient apparently had COVID-19 infection in February as well.  Has finished his COVID isolation on 06/21/2020  No results for input(s): DDIMER, FERRITIN, LDH, CRP in the last 72 hours.  Lab Results  Component Value Date   [place] (A) 06/11/2020   [place] NEGATIVE 01/13/2020   DM-2: CBGs relatively stable-continue SSI-oral hypoglycemic agents on hold  Recent Labs    06/30/20 1942 07/01/20 0725 07/01/20 1206  GLUCAP 156* 161* 228*     Condition -   Guarded  Family Communication  : none at bedside  Code Status :  Full Code  Diet :  Diet Order            Diet regular Room service appropriate? No; Fluid consistency: Thin  Diet effective now                  Disposition Plan  :   Status is: Inpatient  Remains inpatient appropriate because:Inpatient level of care appropriate due to severity of illness   Dispo: The patient is from: Home              Anticipated d/c is to: Home              Patient currently is not medically stable to d/c.   Difficult to place patient No   Barriers to discharge: Resolving encephalopathy-awaiting final culture data.  Antimicorbials  :    Anti-infectives (From admission, onward)   Start     Dose/Rate Route Frequency Ordered Stop   06/18/20 1745  doxycycline (VIBRAMYCIN) 100 mg in sodium chloride 0.9 % 250 mL IVPB  Status:  Discontinued        100 mg 125 mL/hr over 120 Minutes Intravenous Every 12 hours 06/18/20 1648 06/23/20 0847   06/12/20 1000  remdesivir 100 mg in sodium chloride 0.9 % 100 mL IVPB  100 mg 200 mL/hr over 30 Minutes Intravenous Daily 06/11/20 0435 06/13/20 0900   06/11/20 0445  remdesivir 200 mg in sodium chloride 0.9% 250 mL IVPB        200 mg 580 mL/hr over 30 Minutes Intravenous Once 06/11/20 0434 06/11/20 0929   06/11/20  0230  vancomycin (VANCOREADY) IVPB 1500 mg/300 mL        1,500 mg 150 mL/hr over 120 Minutes Intravenous  Once 06/11/20 0229 06/11/20 0812   06/11/20 0215  ampicillin (OMNIPEN) 2 g in sodium chloride 0.9 % 100 mL IVPB        2 g 300 mL/hr over 20 Minutes Intravenous  Once 06/11/20 0157 06/11/20 0550   06/11/20 0215  vancomycin (VANCOREADY) IVPB 1000 mg/200 mL  Status:  Discontinued        1,000 mg 200 mL/hr over 60 Minutes Intravenous  Once 06/11/20 0157 06/11/20 0229   06/11/20 0200  cefTRIAXone (ROCEPHIN) 2 g in sodium chloride 0.9 % 100 mL IVPB        2 g 200 mL/hr over 30 Minutes Intravenous  Once 06/11/20 0157 06/11/20 0248      Inpatient Medications  Scheduled Meds: . amLODipine  10 mg Oral Daily  . aspirin EC  81 mg Oral Daily  . atorvastatin  40 mg Oral Daily  . carvedilol  6.25 mg Oral BID WC  . divalproex  125 mg Oral Q12H  . enoxaparin (LOVENOX) injection  40 mg Subcutaneous Q24H  . feeding supplement  237 mL Oral BID BM  . fluticasone  2 spray Each Nare Daily  . hydrALAZINE  50 mg Oral Q8H  . insulin aspart  0-9 Units Subcutaneous TID WC  . LORazepam  1 mg Intravenous Once  . multivitamin with minerals  1 tablet Oral Daily  . naLOXone (NARCAN)  injection  0.4 mg Intravenous Once  . pantoprazole  40 mg Oral Daily  . QUEtiapine  50 mg Oral Q2000  . senna  2 tablet Oral Daily  . tamsulosin  0.4 mg Oral Daily  . thiamine  100 mg Oral Daily  . vitamin B-12  1,000 mcg Oral Daily   Continuous Infusions:  PRN Meds:.acetaminophen, haloperidol lactate, hydrALAZINE, [DISCONTINUED] ondansetron **OR** ondansetron (ZOFRAN) IV, sodium chloride   See all Orders from today for further details   Phillips Climes M.D on 07/01/2020 at 2:17 PM  To page go to [email or web address] - use universal password  [facility] -  Office  [phone]    Objective:   Vitals:   06/30/20 1925 07/01/20 0640 07/01/20 0645 07/01/20 1345  BP: (!) 132/58 (!) 167/79 (!) 162/71 (!) 146/60   Pulse: 76  84 89  Resp: 16  14 18   Temp: (!) 97.5 F (36.4 C)  98.9 F (37.2 C) 97.9 F (36.6 C)  TempSrc: Axillary  Axillary Oral  SpO2: 98%  100% 99%  Weight:      Height:        Wt Readings from Last 3 Encounters:  06/10/20 86.2 kg  06/10/20 86.2 kg  04/19/20 86.2 kg     Intake/Output Summary (Last 24 hours) at 07/01/2020 1417 Last data filed at 07/01/2020 1300 Gross per 24 hour  Intake 1020 ml  Output 475 ml  Net 545 ml     Physical Exam  Awake Alert, frail, deconditioned, sitting in recliner no apparent distress, pleasantly confused . Good air entry bilaterally, no wheezing RRR,No Gallops,Rubs or new Murmurs, No Parasternal Heave +ve B.Sounds, Abd Soft, No tenderness,  No rebound - guarding or rigidity. No Cyanosis, Clubbing or edema, No new Rash or bruise      Data Review:    CBC Recent Labs  Lab 06/25/20 0332 06/26/20 0444 06/27/20 0052 06/28/20 0044  WBC 12.1* 8.8 8.9 8.3  HGB 12.5* 11.9* 11.3* 11.3*  HCT 38.4* 36.2* 34.6* 34.0*  PLT 222 226 211 232  MCV 86.5 86.4 86.3 86.5  MCH 28.2 28.4 28.2 28.8  MCHC 32.6 32.9 32.7 33.2  RDW 13.7 13.8 13.5 13.5  LYMPHSABS 3.9 3.2 3.0 2.7  MONOABS 1.0 0.7 0.7 0.6  EOSABS 0.2 0.2 0.1 0.1  BASOSABS 0.0 0.0 0.0 0.0    Chemistries  Recent Labs  Lab 06/25/20 0332 06/26/20 0444 06/27/20 0052 06/28/20 0044  NA 140 139 138 138  K 3.5 3.5 3.7 3.6  CL 107 107 105 106  CO2 26 24 25 25   GLUCOSE 129* 147* 140* 139*  BUN 19 18 13 11   CREATININE 1.10 1.03 1.03 1.10  CALCIUM 9.0 8.8* 8.8* 8.8*  MG 2.2 2.1 2.0 2.0  AST 31 27 31 27   ALT 43 37 35 34  ALKPHOS 54 48 52 50  BILITOT 0.8 0.6 0.5 0.7   ------------------------------------------------------------------------------------------------------------------ No results for input(s): CHOL, HDL, LDLCALC, TRIG, CHOLHDL, LDLDIRECT in the last 72 hours.  Lab Results  Component Value Date   HGBA1C 6.6 (H) 06/11/2020    ------------------------------------------------------------------------------------------------------------------ No results for input(s): TSH, T4TOTAL, T3FREE, THYROIDAB in the last 72 hours.  Invalid input(s): FREET3 ------------------------------------------------------------------------------------------------------------------ No results for input(s): VITAMINB12, FOLATE, FERRITIN, TIBC, IRON, RETICCTPCT in the last 72 hours.  Coagulation profile No results for input(s): INR, PROTIME in the last 168 hours.  No results for input(s): DDIMER in the last 72 hours.  Cardiac Enzymes No results for input(s): CKMB, TROPONINI, MYOGLOBIN in the last 168 hours.  Invalid input(s): CK ------------------------------------------------------------------------------------------------------------------    Component Value Date/Time   BNP 39.4 06/28/2020 0044    Micro Results No results found for this or any previous visit (from the past 240 hour(s)).  Radiology Reports CT Head Wo Contrast  Result Date: 06/10/2020 CLINICAL DATA:  Head and C-spine injury, fall, altered mental status EXAM: CT HEAD WITHOUT CONTRAST CT CERVICAL SPINE WITHOUT CONTRAST TECHNIQUE: Multidetector CT imaging of the head and cervical spine was performed following the standard protocol without intravenous contrast. Multiplanar CT image reconstructions of the cervical spine were also generated. COMPARISON:  05/04/2020 FINDINGS: CT HEAD FINDINGS Brain: Stable mild brain atrophy pattern. No acute intracranial hemorrhage, new mass lesion, acute infarction, midline shift, herniation, hydrocephalus, or extra-axial fluid collection. No focal mass effect or edema. Cisterns are patent. Cerebellar atrophy as well. Vascular: Intracranial atherosclerosis at the skull base. No hyperdense vessel. Skull: Normal. Negative for fracture or focal lesion. Sinuses/Orbits: Minor scattered sinus mucosal thickening as before. Orbits are symmetric. No  acute finding. Other: None. CT CERVICAL SPINE FINDINGS Alignment: Normal. Skull base and vertebrae: No acute fracture. No primary bone lesion or focal pathologic process. Soft tissues and spinal canal: Normal prevertebral soft tissues. No hemorrhage or hematoma appreciated. Carotid atherosclerosis. Disc levels: Similar mild multilevel degenerative disc disease and facet arthropathy. Facets are aligned. No subluxation or dislocation. Preserved vertebral body heights. Upper chest: Negative. Other: None. IMPRESSION: Stable brain atrophy pattern. No acute intracranial abnormality or interval change by noncontrast CT. Stable mild cervical degenerative changes as above. No acute osseous finding or malalignment by CT. Electronically Signed   By: Jerilynn Mages.  Shick M.D.   On: 06/10/2020 12:31  CT Cervical Spine Wo Contrast  Result Date: 06/10/2020 CLINICAL DATA:  Head and C-spine injury, fall, altered mental status EXAM: CT HEAD WITHOUT CONTRAST CT CERVICAL SPINE WITHOUT CONTRAST TECHNIQUE: Multidetector CT imaging of the head and cervical spine was performed following the standard protocol without intravenous contrast. Multiplanar CT image reconstructions of the cervical spine were also generated. COMPARISON:  05/04/2020 FINDINGS: CT HEAD FINDINGS Brain: Stable mild brain atrophy pattern. No acute intracranial hemorrhage, new mass lesion, acute infarction, midline shift, herniation, hydrocephalus, or extra-axial fluid collection. No focal mass effect or edema. Cisterns are patent. Cerebellar atrophy as well. Vascular: Intracranial atherosclerosis at the skull base. No hyperdense vessel. Skull: Normal. Negative for fracture or focal lesion. Sinuses/Orbits: Minor scattered sinus mucosal thickening as before. Orbits are symmetric. No acute finding. Other: None. CT CERVICAL SPINE FINDINGS Alignment: Normal. Skull base and vertebrae: No acute fracture. No primary bone lesion or focal pathologic process. Soft tissues and spinal  canal: Normal prevertebral soft tissues. No hemorrhage or hematoma appreciated. Carotid atherosclerosis. Disc levels: Similar mild multilevel degenerative disc disease and facet arthropathy. Facets are aligned. No subluxation or dislocation. Preserved vertebral body heights. Upper chest: Negative. Other: None. IMPRESSION: Stable brain atrophy pattern. No acute intracranial abnormality or interval change by noncontrast CT. Stable mild cervical degenerative changes as above. No acute osseous finding or malalignment by CT. Electronically Signed   By: Jerilynn Mages.  Shick M.D.   On: 06/10/2020 12:31   MR BRAIN WO CONTRAST  Result Date: 06/29/2020 CLINICAL DATA:  Right-sided facial drooping, altered mental status. EXAM: MRI HEAD WITHOUT CONTRAST TECHNIQUE: Multiplanar, multiecho pulse sequences of the brain and surrounding structures were obtained without intravenous contrast. COMPARISON:  June 10, 2020. FINDINGS: Brain: No acute infarction, hemorrhage, hydrocephalus, extra-axial collection or mass lesion. Dilated perivascular space in inferior right basal ganglia. No substantial white matter disease for patient age. Vascular: Partial loss of flow void in the distal left vertebral artery, which could be due to stenosis. Normal flow void in the visualized proximal aICAs. Skull and upper cervical spine: Normal marrow signal. Sinuses/Orbits: Mild mucosal thickening of the frontal sinuses and moderate mucosal thickening of scattered ethmoid air cells. Inferior left maxillary sinus mucous retention cyst. Bilateral exopthalmos, similar to prior. Other: No mastoid effusions. IMPRESSION: 1. No evidence of acute abnormality. 2. Partial loss of flow void in the distal left vertebral artery, similar to prior and possibly due to stenosis. Consider CT angio head and neck to further evaluate. 3. Frontoethmoidal paranasal sinus mucosal thickening. Electronically Signed   By: Margaretha Sheffield MD   On: 06/29/2020 07:47   MR Brain Wo Contrast  (neuro protocol)  Result Date: 06/10/2020 CLINICAL DATA:  Acute neuro deficit.  Mental status change. EXAM: MRI HEAD WITHOUT CONTRAST TECHNIQUE: Multiplanar, multiecho pulse sequences of the brain and surrounding structures were obtained without intravenous contrast. COMPARISON:  CT head 06/10/2020 FINDINGS: Brain: Ventricle size and cerebral volume normal for age. Negative for acute infarct. No significant chronic ischemic change. Negative for hemorrhage or mass. Image quality degraded by mild motion. Vascular: Partial loss of flow void in the left internal carotid artery through the skull base and the distal left vertebral artery which may be due to slow flow or stenosis. Skull and upper cervical spine: Negative Sinuses/Orbits: Mild mucosal edema paranasal sinuses. No orbital mass. Bilateral exophthalmos. Other: None IMPRESSION: Negative for acute infarct. Partial loss of flow void in the left internal carotid artery distal left vertebral artery which could be due to proximal stenosis. Consider CT  angio head and neck for further evaluation. Electronically Signed   By: Franchot Gallo M.D.   On: 06/10/2020 15:09   DG Chest Port 1 View  Result Date: 06/25/2020 CLINICAL DATA:  Shortness of breath and lethargy. EXAM: PORTABLE CHEST 1 VIEW COMPARISON:  Chest x-ray 06/24/2020 FINDINGS: The cardiac silhouette, mediastinal and hilar contours are within limits. Stable mild tortuosity and calcification the thoracic aorta. The lungs are clear of an acute process. No pleural effusions or pulmonary lesions. The bony thorax is intact. IMPRESSION: No acute cardiopulmonary findings. Electronically Signed   By: Marijo Sanes M.D.   On: 06/25/2020 14:15   DG Chest Port 1 View  Result Date: 06/24/2020 CLINICAL DATA:  Shortness of breath. EXAM: PORTABLE CHEST 1 VIEW COMPARISON:  June 20, 2020. FINDINGS: The heart size and mediastinal contours are within normal limits. Both lungs are clear. The visualized skeletal structures  are unremarkable. IMPRESSION: No active disease. Electronically Signed   By: Marijo Conception M.D.   On: 06/24/2020 08:13   DG Chest Port 1 View  Result Date: 06/20/2020 CLINICAL DATA:  Shortness of breath EXAM: PORTABLE CHEST 1 VIEW COMPARISON:  06/11/2020 FINDINGS: Loop recorder is identified adjacent to the cardiac apex. Normal heart size. No pleural effusion or edema. No airspace densities identified. No acute osseous findings. IMPRESSION: No acute cardiopulmonary abnormalities. Electronically Signed   By: Kerby Moors M.D.   On: 06/20/2020 08:34   DG Chest Port 1 View  Result Date: 06/11/2020 CLINICAL DATA:  Questionable sepsis. EXAM: PORTABLE CHEST 1 VIEW COMPARISON:  June 10, 2020 FINDINGS: There is no evidence of acute infiltrate, pleural effusion or pneumothorax. The heart size and mediastinal contours are within normal limits. A radiopaque loop recorder device is seen. The visualized skeletal structures are unremarkable. IMPRESSION: No active disease. Electronically Signed   By: Virgina Norfolk M.D.   On: 06/11/2020 03:10   DG Chest Port 1 View  Result Date: 06/10/2020 CLINICAL DATA:  Altered mental status. EXAM: PORTABLE CHEST 1 VIEW COMPARISON:  May 25, 2020 FINDINGS: Loop recorder in place. Cardiomediastinal silhouette is normal. Mediastinal contours appear intact. Tortuosity and calcific atherosclerotic disease of the aorta. There is no evidence of focal airspace consolidation, pleural effusion or pneumothorax. Osseous structures are without acute abnormality. Soft tissues are grossly normal. IMPRESSION: No active disease. Electronically Signed   By: Fidela Salisbury M.D.   On: 06/10/2020 11:15   EEG adult  Result Date: 06/29/2020 Patrick Havens, MD     06/29/2020  1:23 PM Patient Name: Patrick Phillips MRN: [id number] Epilepsy Attending: Lora Phillips Referring Physician/Provider: Dr Amie [place] Date: 06/29/2020 Duration: 21.43 mins Patient history: 80 year old male with waxing  and waning mental status.  EEG to evaluate for seizures. Level of alertness: Awake AEDs during EEG study: Depakote Technical aspects: This EEG study was done with scalp electrodes positioned according to the 10-20 International system of electrode placement. Electrical activity was acquired at a sampling rate of 500Hz  and reviewed with a high frequency filter of 70Hz  and a low frequency filter of 1Hz . EEG data were recorded continuously and digitally stored. Description: The posterior dominant rhythm consists of 8-9 Hz activity of moderate voltage (25-35 uV) seen predominantly in posterior head regions, symmetric and reactive to eye opening and eye closing. EEG showed intermittent generalized 5 to 6 Hz theta slowing. Hyperventilation and photic stimulation were not performed.   ABNORMALITY - Intermittent slow, generalized IMPRESSION: This study is suggestive of mild diffuse encephalopathy, nonspecific etiology. No  seizures or epileptiform discharges were seen throughout the recording. Patrick Phillips   EEG adult  Result Date: 06/12/2020 Patrick Havens, MD     06/12/2020  3:07 PM Patient Name: Patrick Phillips MRN: [id number] Epilepsy Attending: Lora Phillips Referring Physician/Provider: Dr Oren Binet Date: 06/12/2020 Duration: 25.42 mins Patient history: 80yo M with ams. EEG to evaluate for seizure Level of alertness: Awake AEDs during EEG study: None Technical aspects: This EEG study was done with scalp electrodes positioned according to the 10-20 International system of electrode placement. Electrical activity was acquired at a sampling rate of 500Hz  and reviewed with a high frequency filter of 70Hz  and a low frequency filter of 1Hz . EEG data were recorded continuously and digitally stored. Description: No posterior dominant rhythm was seen. EEG showed continuous generalized 5 to 6 Hz theta slowing. Hyperventilation and photic stimulation were not performed.   ABNORMALITY - Continuous slow, generalized  IMPRESSION: This study is suggestive of mild to moderate diffuse encephalopathy, nonspecific etiology. No seizures or epileptiform discharges were seen throughout the recording. Priyanka Barbra Sarks   DG Hips Bilat W or Wo Pelvis 3-4 Views  Result Date: 06/10/2020 CLINICAL DATA:  Found on side of road, last seen normal at 2130 hours last night, fall, dementia, tenderness of hips bilaterally EXAM: DG HIP (WITH OR WITHOUT PELVIS) 3-4V BILAT COMPARISON:  None FINDINGS: Osseous demineralization. Hip and SI joint spaces preserved. No acute fracture, dislocation, or bone destruction. Mild degenerative disc disease changes at visualized lower lumbar spine. IMPRESSION: No acute osseous abnormalities. Electronically Signed   By: Lavonia Dana M.D.   On: 06/10/2020 11:48   CT HEAD CODE STROKE WO CONTRAST`  Result Date: 06/28/2020 CLINICAL DATA:  Code stroke.  Confusion.  Altered mental status. EXAM: CT HEAD WITHOUT CONTRAST TECHNIQUE: Contiguous axial images were obtained from the base of the skull through the vertex without intravenous contrast. COMPARISON:  MRI 06/10/2020 FINDINGS: Brain: Age related volume loss. No evidence of old or acute small or large vessel infarction. No mass lesion, hemorrhage, hydrocephalus or extra-axial collection. Dilated perivascular space at the base of the brain. Vascular: There is atherosclerotic calcification of the major vessels at the base of the brain. Skull: Negative Sinuses/Orbits: Clear/normal Other: None ASPECTS ([place] Stroke Program Early CT Score) - Ganglionic level infarction (caudate, lentiform nuclei, internal capsule, insula, M1-M3 cortex): 7 - Supraganglionic infarction (M4-M6 cortex): 3 Total score (0-10 with 10 being normal): 10 IMPRESSION: 1. Normal head CT for age. Atherosclerotic calcification of the major vessels at base of the brain. 2. ASPECTS is 10 3. These results were communicated to Dr. Rory Percy at 7:18 pmon 5/2/2022by text page via the [facility] messaging system.  Electronically Signed   By: Nelson Chimes M.D.   On: 06/28/2020 19:19

## 2020-07-01 NOTE — TOC Progression Note (Signed)
Transition of Care [facility]) - Progression Note    Patient Details  Name: Patrick Phillips MRN: [id number] Date of Birth: [birth year]  Transition of Care [facility]) CM/SW Vanduser, LCSW Phone Number: 07/01/2020, 2:48 PM  Clinical Narrative:    CSW sent referral to [facility] to see if they can review patient again since he has been noted to be more cooperative. CSW left voicemail for [facility] as well.    Expected Discharge Plan: Memory Care Barriers to Discharge: Continued Medical Work up  Expected Discharge Plan and Services Expected Discharge Plan: Memory Care In-house Referral: Clinical Social Work Discharge Planning Services: CM Consult Post Acute Care Choice: Nursing Home Living arrangements for the past 2 months: Single Family Home Expected Discharge Date: 06/29/20                                     Social Determinants of Health (SDOH) Interventions    Readmission Risk Interventions No flowsheet data found.

## 2020-07-01 NOTE — Progress Notes (Signed)
EEG was most consistent with a mild diffuse encephalopathy. No electrographic seizures or epileptiform discharges were seen.   No further Neurological work up indicated.   Neurology will sign off. Please call if there are additional questions.   Electronically signed: Dr. Kerney Elbe

## 2020-07-02 ENCOUNTER — Inpatient Hospital Stay (HOSPITAL_COMMUNITY): Payer: Medicare Other

## 2020-07-02 DIAGNOSIS — R410 Disorientation, unspecified: Secondary | ICD-10-CM | POA: Diagnosis not present

## 2020-07-02 DIAGNOSIS — U071 COVID-19: Secondary | ICD-10-CM | POA: Diagnosis not present

## 2020-07-02 LAB — CBC
HCT: 37.8 % — ABNORMAL LOW (ref 39.0–52.0)
Hemoglobin: 12.5 g/dL — ABNORMAL LOW (ref 13.0–17.0)
MCH: 28.2 pg (ref 26.0–34.0)
MCHC: 33.1 g/dL (ref 30.0–36.0)
MCV: 85.1 fL (ref 80.0–100.0)
Platelets: 254 10*3/uL (ref 150–400)
RBC: 4.44 MIL/uL (ref 4.22–5.81)
RDW: 13.6 % (ref 11.5–15.5)
WBC: 10.8 10*3/uL — ABNORMAL HIGH (ref 4.0–10.5)
nRBC: 0 % (ref 0.0–0.2)

## 2020-07-02 LAB — BASIC METABOLIC PANEL
Anion gap: 7 (ref 5–15)
BUN: 14 mg/dL (ref 8–23)
CO2: 24 mmol/L (ref 22–32)
Calcium: 9.2 mg/dL (ref 8.9–10.3)
Chloride: 106 mmol/L (ref 98–111)
Creatinine, Ser: 0.93 mg/dL (ref 0.61–1.24)
GFR, Estimated: >60 mL/min (ref >60)
Glucose, Bld: 164 mg/dL — ABNORMAL HIGH (ref 70–99)
Potassium: 3.9 mmol/L (ref 3.5–5.1)
Sodium: 137 mmol/L (ref 135–145)

## 2020-07-02 LAB — GLUCOSE, CAPILLARY
Glucose-Capillary: 189 mg/dL — ABNORMAL HIGH (ref 70–99)
Glucose-Capillary: 264 mg/dL — ABNORMAL HIGH (ref 70–99)
Glucose-Capillary: 251 mg/dL — ABNORMAL HIGH (ref 70–99)
Glucose-Capillary: 96 mg/dL (ref 70–99)

## 2020-07-02 NOTE — Progress Notes (Signed)
PROGRESS NOTE                                                                                                                                                                                                             Patient Demographics:    Patrick Phillips, is a 80 y.o. male, DOB - 12/27/1940, [id number]  Outpatient Primary MD for the patient is [name], MD   Admit date - 06/10/2020   LOS - 21  Chief Complaint  Patient presents with  . Altered Mental Status       Brief Narrative: Patient is a 80 y.o. male with PMHx of vascular dementia, prior CVA, DM-2, HTN-who presented with fever and confusion.  COVID-19 vaccinated status:  Unknown  Significant Events:  4/14>> Admit to [facility] for fever/confusion  Significant studies:  4/14>>Chest x-ray: No PNA 4/14>> CT head: No acute intracranial abnormality. 4/14>> CT C-spine: No fracture 4/14>> MRI brain: No acute infarct. 4/15>> chest x-ray: No PNA 4/15>> CSF: WBC 1 (tube #4), protein 46, glucose 54 4/16>> EEG: Mild to moderate diffuse encephalopathy-no seizures.  COVID-19 medications:  Remdesivir: 4/15>>  Antibiotics:  Vancomycin: 4/14 x1 Ceftriaxone: 4/14 x 1 Ampicillin: 4/14 x 1  Microbiology data:  4/14 >>blood culture: No growth 4/14>> urine culture: No growth 4/15>> blood culture: No growth 4/15>> urine culture: Multiple species 4/15>> CSF culture: No growth 4/15>> CSF PCR HSV 1/2: Negative. 4/15>> CSF HSV culture: Negative 4/15>> CSF arbovirus IgG: Negative 4/15>> CSF VDRL: Negative RMSF CSF serology IgM -ve  Procedures:  4/15>> lumbar puncture in the emergency room  Consults:  Neurology, psychiatry, Pall.Care  DVT prophylaxis:  enoxaparin (LOVENOX) injection 40 mg Start: 06/11/20 0600    Subjective:   Patient in bed, no significant events overnight, he remains with intermittent episodes of confusion and restlessness.       Assessment  & Plan :   Acute metabolic encephalopathy-superimposed on underlying Dementia with delirium:  - Encephalopathy on presentation was likely related to febrile illness/COVID-19 infection-all culture data negative so far.  CT head, MRI brain and EEG unremarkable. He has had no fever since admission-his encephalopathy I suspect his confusion/agitation now  is related to delirium associated with dementia. Neuro and Psych consulted. -Hospital delirium remains significant problem, but his medication has been adjusted frequently, but overall he seems to be doing better on Seroquel 50 mg nightly, Depakote 125 mg twice daily. - Evaluated by psychiatry and was initially started on Saphris and Depakote-  Saphris was stopped on 06/23/20 due to somnolence and was replaced by Seroquel, have reduced Depakote dose due to sedation along with Seroquel on 06/25/2020 with less somnolence, continue on the lower dose Seroquel with Depakote. -EEG with no evidence of seizure, just significant for encephalopathic. - Note mental status is waxing and waning now some better, have involved palliative care for goals of care as oral intake remains very poor and needs augmentation with IV fluids every few days, which is not a good longterm prognostic sign.   ? Tick Bite: 2 ticks found by nursing staff on patient 06/17/20 - RMSF IgM - ve, DW ID Dr Tommy Medal 06/23/20 - not acute, no Rx.  AKI: Mild-likely hemodynamically mediated-resolved with IV fluids.  Dysphagia -Patient followed closely by SLP, had MBS done today, apparently he has been swallowing been aspirating thin liquids almost hallucinating, but so far no signs of pneumonia, will sign Juliane, increases risk of dehydration.  Borderline vitamin B12 deficiency: On supplementation.  Hypokalemia: Repleted-recheck periodically.  HTN: Currently on Coreg, Norvasc, added hydralazine for better control.  History of CVA-MRI brain negative x 2  - non acute-on aspirin/statin.  Seen by Neuro, ? Code stroke 06/28/20 PM, doubt a true event, MRI negative again, stable exam, defer any more workup to Neuro.  Dementia: On Namenda  Moderate protein calorie malnutrition - seen by nutritionist.  GERD: On PPI  SIRS-COVID-19 infection: Completed Remdesivir x3 days.  Not hypoxic-no pneumonia-does not require any further therapy.  Interestingly-patient apparently had COVID-19 infection in February as well.  Has finished his COVID isolation on 06/21/2020  No results for input(s): DDIMER, FERRITIN, LDH, CRP in the last 72 hours.  Lab Results  Component Value Date   [place] (A) 06/11/2020   [place] NEGATIVE 01/13/2020   DM-2: CBGs relatively stable-continue SSI-oral hypoglycemic agents on hold  Recent Labs    07/01/20 2020 07/02/20 0800 07/02/20 1441  GLUCAP 95 189* 264*     Condition -   Guarded  Family Communication  : none at bedside  Code Status :  DNR  Diet :  Diet Order            Diet regular Room service appropriate? No; Fluid consistency: Thin  Diet effective now                  Disposition Plan  :   Status is: Inpatient  Remains inpatient appropriate because:Inpatient level of care appropriate due to severity of illness   Dispo: The patient is from: Home              Anticipated d/c is to: SNF              Patient currently is medically stable to d/c. awaiting SNF bed availability   Difficult to place patient No   Barriers to discharge: Resolving encephalopathy-awaiting final culture data.  Antimicorbials  :    Anti-infectives (From admission, onward)   Start     Dose/Rate Route Frequency Ordered Stop   06/18/20 1745  doxycycline (VIBRAMYCIN) 100 mg in sodium chloride 0.9 % 250 mL IVPB  Status:  Discontinued        100 mg 125  mL/hr over 120 Minutes Intravenous Every 12 hours 06/18/20 1648 06/23/20 0847   06/12/20 1000  remdesivir 100 mg in sodium chloride 0.9 % 100 mL IVPB        100 mg 200 mL/hr over 30 Minutes  Intravenous Daily 06/11/20 0435 06/13/20 0900   06/11/20 0445  remdesivir 200 mg in sodium chloride 0.9% 250 mL IVPB        200 mg 580 mL/hr over 30 Minutes Intravenous Once 06/11/20 0434 06/11/20 0929   06/11/20 0230  vancomycin (VANCOREADY) IVPB 1500 mg/300 mL        1,500 mg 150 mL/hr over 120 Minutes Intravenous  Once 06/11/20 0229 06/11/20 0812   06/11/20 0215  ampicillin (OMNIPEN) 2 g in sodium chloride 0.9 % 100 mL IVPB        2 g 300 mL/hr over 20 Minutes Intravenous  Once 06/11/20 0157 06/11/20 0550   06/11/20 0215  vancomycin (VANCOREADY) IVPB 1000 mg/200 mL  Status:  Discontinued        1,000 mg 200 mL/hr over 60 Minutes Intravenous  Once 06/11/20 0157 06/11/20 0229   06/11/20 0200  cefTRIAXone (ROCEPHIN) 2 g in sodium chloride 0.9 % 100 mL IVPB        2 g 200 mL/hr over 30 Minutes Intravenous  Once 06/11/20 0157 06/11/20 0248      Inpatient Medications  Scheduled Meds: . amLODipine  10 mg Oral Daily  . aspirin EC  81 mg Oral Daily  . atorvastatin  40 mg Oral Daily  . carvedilol  6.25 mg Oral BID WC  . divalproex  125 mg Oral Q12H  . enoxaparin (LOVENOX) injection  40 mg Subcutaneous Q24H  . feeding supplement  237 mL Oral BID BM  . fluticasone  2 spray Each Nare Daily  . hydrALAZINE  50 mg Oral Q8H  . insulin aspart  0-9 Units Subcutaneous TID WC  . LORazepam  1 mg Intravenous Once  . multivitamin with minerals  1 tablet Oral Daily  . naLOXone (NARCAN)  injection  0.4 mg Intravenous Once  . pantoprazole  40 mg Oral Daily  . QUEtiapine  50 mg Oral Q2000  . senna  2 tablet Oral Daily  . tamsulosin  0.4 mg Oral Daily  . thiamine  100 mg Oral Daily  . vitamin B-12  1,000 mcg Oral Daily   Continuous Infusions:  PRN Meds:.acetaminophen, haloperidol lactate, hydrALAZINE, [DISCONTINUED] ondansetron **OR** ondansetron (ZOFRAN) IV, sodium chloride   See all Orders from today for further details   Phillips Climes M.D on 07/02/2020 at 2:52 PM  To page go to  [email or web address] - use universal password  [facility] -  Office  [phone]    Objective:   Vitals:   07/01/20 1356 07/01/20 2104 07/02/20 0500 07/02/20 0800  BP: (!) 145/60 (!) 145/62 (!) 143/65 140/63  Pulse: 80 85 84 75  Resp:  18 18 15   Temp: 97.9 F (36.6 C) 97.8 F (36.6 C) 97.9 F (36.6 C) 98 F (36.7 C)  TempSrc: Oral Oral Oral Oral  SpO2: 100% 100% 100%   Weight:      Height:        Wt Readings from Last 3 Encounters:  06/10/20 86.2 kg  06/10/20 86.2 kg  04/19/20 86.2 kg     Intake/Output Summary (Last 24 hours) at 07/02/2020 1452 Last data filed at 07/02/2020 0800 Gross per 24 hour  Intake 360 ml  Output 1275 ml  Net -915 ml  Physical Exam  Awake , frail, confused, pleasant, communicative Symmetrical Chest wall movement, Good air movement bilaterally, CTAB RRR,No Gallops,Rubs or new Murmurs, No Parasternal Heave +ve B.Sounds, Abd Soft, No tenderness, No rebound - guarding or rigidity. No Cyanosis, Clubbing or edema, No new Rash or bruise       Data Review:    CBC Recent Labs  Lab 06/26/20 0444 06/27/20 0052 06/28/20 0044 07/02/20 0655  WBC 8.8 8.9 8.3 10.8*  HGB 11.9* 11.3* 11.3* 12.5*  HCT 36.2* 34.6* 34.0* 37.8*  PLT 226 211 232 254  MCV 86.4 86.3 86.5 85.1  MCH 28.4 28.2 28.8 28.2  MCHC 32.9 32.7 33.2 33.1  RDW 13.8 13.5 13.5 13.6  LYMPHSABS 3.2 3.0 2.7  --   MONOABS 0.7 0.7 0.6  --   EOSABS 0.2 0.1 0.1  --   BASOSABS 0.0 0.0 0.0  --     Chemistries  Recent Labs  Lab 06/26/20 0444 06/27/20 0052 06/28/20 0044 07/02/20 0655  NA 139 138 138 137  K 3.5 3.7 3.6 3.9  CL 107 105 106 106  CO2 24 25 25 24   GLUCOSE 147* 140* 139* 164*  BUN 18 13 11 14   CREATININE 1.03 1.03 1.10 0.93  CALCIUM 8.8* 8.8* 8.8* 9.2  MG 2.1 2.0 2.0  --   AST 27 31 27   --   ALT 37 35 34  --   ALKPHOS 48 52 50  --   BILITOT 0.6 0.5 0.7  --     ------------------------------------------------------------------------------------------------------------------ No results for input(s): CHOL, HDL, LDLCALC, TRIG, CHOLHDL, LDLDIRECT in the last 72 hours.  Lab Results  Component Value Date   HGBA1C 6.6 (H) 06/11/2020   ------------------------------------------------------------------------------------------------------------------ No results for input(s): TSH, T4TOTAL, T3FREE, THYROIDAB in the last 72 hours.  Invalid input(s): FREET3 ------------------------------------------------------------------------------------------------------------------ No results for input(s): VITAMINB12, FOLATE, FERRITIN, TIBC, IRON, RETICCTPCT in the last 72 hours.  Coagulation profile No results for input(s): INR, PROTIME in the last 168 hours.  No results for input(s): DDIMER in the last 72 hours.  Cardiac Enzymes No results for input(s): CKMB, TROPONINI, MYOGLOBIN in the last 168 hours.  Invalid input(s): CK ------------------------------------------------------------------------------------------------------------------    Component Value Date/Time   BNP 39.4 06/28/2020 0044    Micro Results No results found for this or any previous visit (from the past 240 hour(s)).  Radiology Reports CT Head Wo Contrast  Result Date: 06/10/2020 CLINICAL DATA:  Head and C-spine injury, fall, altered mental status EXAM: CT HEAD WITHOUT CONTRAST CT CERVICAL SPINE WITHOUT CONTRAST TECHNIQUE: Multidetector CT imaging of the head and cervical spine was performed following the standard protocol without intravenous contrast. Multiplanar CT image reconstructions of the cervical spine were also generated. COMPARISON:  05/04/2020 FINDINGS: CT HEAD FINDINGS Brain: Stable mild brain atrophy pattern. No acute intracranial hemorrhage, new mass lesion, acute infarction, midline shift, herniation, hydrocephalus, or extra-axial fluid collection. No focal mass effect or  edema. Cisterns are patent. Cerebellar atrophy as well. Vascular: Intracranial atherosclerosis at the skull base. No hyperdense vessel. Skull: Normal. Negative for fracture or focal lesion. Sinuses/Orbits: Minor scattered sinus mucosal thickening as before. Orbits are symmetric. No acute finding. Other: None. CT CERVICAL SPINE FINDINGS Alignment: Normal. Skull base and vertebrae: No acute fracture. No primary bone lesion or focal pathologic process. Soft tissues and spinal canal: Normal prevertebral soft tissues. No hemorrhage or hematoma appreciated. Carotid atherosclerosis. Disc levels: Similar mild multilevel degenerative disc disease and facet arthropathy. Facets are aligned. No subluxation or dislocation. Preserved vertebral body heights.  Upper chest: Negative. Other: None. IMPRESSION: Stable brain atrophy pattern. No acute intracranial abnormality or interval change by noncontrast CT. Stable mild cervical degenerative changes as above. No acute osseous finding or malalignment by CT. Electronically Signed   By: Jerilynn Mages.  Shick M.D.   On: 06/10/2020 12:31   CT Cervical Spine Wo Contrast  Result Date: 06/10/2020 CLINICAL DATA:  Head and C-spine injury, fall, altered mental status EXAM: CT HEAD WITHOUT CONTRAST CT CERVICAL SPINE WITHOUT CONTRAST TECHNIQUE: Multidetector CT imaging of the head and cervical spine was performed following the standard protocol without intravenous contrast. Multiplanar CT image reconstructions of the cervical spine were also generated. COMPARISON:  05/04/2020 FINDINGS: CT HEAD FINDINGS Brain: Stable mild brain atrophy pattern. No acute intracranial hemorrhage, new mass lesion, acute infarction, midline shift, herniation, hydrocephalus, or extra-axial fluid collection. No focal mass effect or edema. Cisterns are patent. Cerebellar atrophy as well. Vascular: Intracranial atherosclerosis at the skull base. No hyperdense vessel. Skull: Normal. Negative for fracture or focal lesion.  Sinuses/Orbits: Minor scattered sinus mucosal thickening as before. Orbits are symmetric. No acute finding. Other: None. CT CERVICAL SPINE FINDINGS Alignment: Normal. Skull base and vertebrae: No acute fracture. No primary bone lesion or focal pathologic process. Soft tissues and spinal canal: Normal prevertebral soft tissues. No hemorrhage or hematoma appreciated. Carotid atherosclerosis. Disc levels: Similar mild multilevel degenerative disc disease and facet arthropathy. Facets are aligned. No subluxation or dislocation. Preserved vertebral body heights. Upper chest: Negative. Other: None. IMPRESSION: Stable brain atrophy pattern. No acute intracranial abnormality or interval change by noncontrast CT. Stable mild cervical degenerative changes as above. No acute osseous finding or malalignment by CT. Electronically Signed   By: Jerilynn Mages.  Shick M.D.   On: 06/10/2020 12:31   MR BRAIN WO CONTRAST  Result Date: 06/29/2020 CLINICAL DATA:  Right-sided facial drooping, altered mental status. EXAM: MRI HEAD WITHOUT CONTRAST TECHNIQUE: Multiplanar, multiecho pulse sequences of the brain and surrounding structures were obtained without intravenous contrast. COMPARISON:  June 10, 2020. FINDINGS: Brain: No acute infarction, hemorrhage, hydrocephalus, extra-axial collection or mass lesion. Dilated perivascular space in inferior right basal ganglia. No substantial white matter disease for patient age. Vascular: Partial loss of flow void in the distal left vertebral artery, which could be due to stenosis. Normal flow void in the visualized proximal aICAs. Skull and upper cervical spine: Normal marrow signal. Sinuses/Orbits: Mild mucosal thickening of the frontal sinuses and moderate mucosal thickening of scattered ethmoid air cells. Inferior left maxillary sinus mucous retention cyst. Bilateral exopthalmos, similar to prior. Other: No mastoid effusions. IMPRESSION: 1. No evidence of acute abnormality. 2. Partial loss of flow void  in the distal left vertebral artery, similar to prior and possibly due to stenosis. Consider CT angio head and neck to further evaluate. 3. Frontoethmoidal paranasal sinus mucosal thickening. Electronically Signed   By: Margaretha Sheffield MD   On: 06/29/2020 07:47   MR Brain Wo Contrast (neuro protocol)  Result Date: 06/10/2020 CLINICAL DATA:  Acute neuro deficit.  Mental status change. EXAM: MRI HEAD WITHOUT CONTRAST TECHNIQUE: Multiplanar, multiecho pulse sequences of the brain and surrounding structures were obtained without intravenous contrast. COMPARISON:  CT head 06/10/2020 FINDINGS: Brain: Ventricle size and cerebral volume normal for age. Negative for acute infarct. No significant chronic ischemic change. Negative for hemorrhage or mass. Image quality degraded by mild motion. Vascular: Partial loss of flow void in the left internal carotid artery through the skull base and the distal left vertebral artery which may be due to slow  flow or stenosis. Skull and upper cervical spine: Negative Sinuses/Orbits: Mild mucosal edema paranasal sinuses. No orbital mass. Bilateral exophthalmos. Other: None IMPRESSION: Negative for acute infarct. Partial loss of flow void in the left internal carotid artery distal left vertebral artery which could be due to proximal stenosis. Consider CT angio head and neck for further evaluation. Electronically Signed   By: Franchot Gallo M.D.   On: 06/10/2020 15:09   DG Chest Port 1 View  Result Date: 06/25/2020 CLINICAL DATA:  Shortness of breath and lethargy. EXAM: PORTABLE CHEST 1 VIEW COMPARISON:  Chest x-ray 06/24/2020 FINDINGS: The cardiac silhouette, mediastinal and hilar contours are within limits. Stable mild tortuosity and calcification the thoracic aorta. The lungs are clear of an acute process. No pleural effusions or pulmonary lesions. The bony thorax is intact. IMPRESSION: No acute cardiopulmonary findings. Electronically Signed   By: Marijo Sanes M.D.   On:  06/25/2020 14:15   DG Chest Port 1 View  Result Date: 06/24/2020 CLINICAL DATA:  Shortness of breath. EXAM: PORTABLE CHEST 1 VIEW COMPARISON:  June 20, 2020. FINDINGS: The heart size and mediastinal contours are within normal limits. Both lungs are clear. The visualized skeletal structures are unremarkable. IMPRESSION: No active disease. Electronically Signed   By: Marijo Conception M.D.   On: 06/24/2020 08:13   DG Chest Port 1 View  Result Date: 06/20/2020 CLINICAL DATA:  Shortness of breath EXAM: PORTABLE CHEST 1 VIEW COMPARISON:  06/11/2020 FINDINGS: Loop recorder is identified adjacent to the cardiac apex. Normal heart size. No pleural effusion or edema. No airspace densities identified. No acute osseous findings. IMPRESSION: No acute cardiopulmonary abnormalities. Electronically Signed   By: Kerby Moors M.D.   On: 06/20/2020 08:34   DG Chest Port 1 View  Result Date: 06/11/2020 CLINICAL DATA:  Questionable sepsis. EXAM: PORTABLE CHEST 1 VIEW COMPARISON:  June 10, 2020 FINDINGS: There is no evidence of acute infiltrate, pleural effusion or pneumothorax. The heart size and mediastinal contours are within normal limits. A radiopaque loop recorder device is seen. The visualized skeletal structures are unremarkable. IMPRESSION: No active disease. Electronically Signed   By: Virgina Norfolk M.D.   On: 06/11/2020 03:10   DG Chest Port 1 View  Result Date: 06/10/2020 CLINICAL DATA:  Altered mental status. EXAM: PORTABLE CHEST 1 VIEW COMPARISON:  May 25, 2020 FINDINGS: Loop recorder in place. Cardiomediastinal silhouette is normal. Mediastinal contours appear intact. Tortuosity and calcific atherosclerotic disease of the aorta. There is no evidence of focal airspace consolidation, pleural effusion or pneumothorax. Osseous structures are without acute abnormality. Soft tissues are grossly normal. IMPRESSION: No active disease. Electronically Signed   By: Fidela Salisbury M.D.   On: 06/10/2020  11:15   EEG adult  Result Date: 06/29/2020 Lora Havens, MD     06/29/2020  1:23 PM Patient Name: Patrick Phillips MRN: [id number] Epilepsy Attending: Lora Havens Referring Physician/Provider: Dr Amie [place] Date: 06/29/2020 Duration: 21.43 mins Patient history: 80 year old male with waxing and waning mental status.  EEG to evaluate for seizures. Level of alertness: Awake AEDs during EEG study: Depakote Technical aspects: This EEG study was done with scalp electrodes positioned according to the 10-20 International system of electrode placement. Electrical activity was acquired at a sampling rate of 500Hz  and reviewed with a high frequency filter of 70Hz  and a low frequency filter of 1Hz . EEG data were recorded continuously and digitally stored. Description: The posterior dominant rhythm consists of 8-9 Hz activity of moderate voltage (25-35 uV)  seen predominantly in posterior head regions, symmetric and reactive to eye opening and eye closing. EEG showed intermittent generalized 5 to 6 Hz theta slowing. Hyperventilation and photic stimulation were not performed.   ABNORMALITY - Intermittent slow, generalized IMPRESSION: This study is suggestive of mild diffuse encephalopathy, nonspecific etiology. No seizures or epileptiform discharges were seen throughout the recording. Lora Havens   EEG adult  Result Date: 06/12/2020 Lora Havens, MD     06/12/2020  3:07 PM Patient Name: Jaskirat Dewar MRN: [id number] Epilepsy Attending: Lora Havens Referring Physician/Provider: Dr Oren Binet Date: 06/12/2020 Duration: 25.42 mins Patient history: 80yo M with ams. EEG to evaluate for seizure Level of alertness: Awake AEDs during EEG study: None Technical aspects: This EEG study was done with scalp electrodes positioned according to the 10-20 International system of electrode placement. Electrical activity was acquired at a sampling rate of 500Hz  and reviewed with a high frequency filter of 70Hz  and a low  frequency filter of 1Hz . EEG data were recorded continuously and digitally stored. Description: No posterior dominant rhythm was seen. EEG showed continuous generalized 5 to 6 Hz theta slowing. Hyperventilation and photic stimulation were not performed.   ABNORMALITY - Continuous slow, generalized IMPRESSION: This study is suggestive of mild to moderate diffuse encephalopathy, nonspecific etiology. No seizures or epileptiform discharges were seen throughout the recording. Priyanka Barbra Sarks   DG Hips Bilat W or Wo Pelvis 3-4 Views  Result Date: 06/10/2020 CLINICAL DATA:  Found on side of road, last seen normal at 2130 hours last night, fall, dementia, tenderness of hips bilaterally EXAM: DG HIP (WITH OR WITHOUT PELVIS) 3-4V BILAT COMPARISON:  None FINDINGS: Osseous demineralization. Hip and SI joint spaces preserved. No acute fracture, dislocation, or bone destruction. Mild degenerative disc disease changes at visualized lower lumbar spine. IMPRESSION: No acute osseous abnormalities. Electronically Signed   By: Lavonia Dana M.D.   On: 06/10/2020 11:48   CT HEAD CODE STROKE WO CONTRAST`  Result Date: 06/28/2020 CLINICAL DATA:  Code stroke.  Confusion.  Altered mental status. EXAM: CT HEAD WITHOUT CONTRAST TECHNIQUE: Contiguous axial images were obtained from the base of the skull through the vertex without intravenous contrast. COMPARISON:  MRI 06/10/2020 FINDINGS: Brain: Age related volume loss. No evidence of old or acute small or large vessel infarction. No mass lesion, hemorrhage, hydrocephalus or extra-axial collection. Dilated perivascular space at the base of the brain. Vascular: There is atherosclerotic calcification of the major vessels at the base of the brain. Skull: Negative Sinuses/Orbits: Clear/normal Other: None ASPECTS (White Stroke Program Early CT Score) - Ganglionic level infarction (caudate, lentiform nuclei, internal capsule, insula, M1-M3 cortex): 7 - Supraganglionic infarction (M4-M6  cortex): 3 Total score (0-10 with 10 being normal): 10 IMPRESSION: 1. Normal head CT for age. Atherosclerotic calcification of the major vessels at base of the brain. 2. ASPECTS is 10 3. These results were communicated to Dr. Rory Percy at 7:18 pmon 5/2/2022by text page via the [facility] messaging system. Electronically Signed   By: Nelson Chimes M.D.   On: 06/28/2020 19:19

## 2020-07-02 NOTE — TOC Progression Note (Signed)
Transition of Care [facility]) - Progression Note    Patient Details  Name: Filip Luten MRN: [id number] Date of Birth: [birth year]  Transition of Care [facility]) CM/SW [place], LCSW Phone Number: 07/02/2020, 5:06 PM  Clinical Narrative:    12pm-[facility] unable to accept patient due to him not being COVID vaccinated. [facility] also declined due to unvaccinated status, but since patient is with recent COVID infection he cannot be vaccinated yet. CSW informed [facility] and they may review patient again since he would be considered COVID recovered and not require isolation.    Expected Discharge Plan: Memory Care Barriers to Discharge: Continued Medical Work up  Expected Discharge Plan and Services Expected Discharge Plan: Memory Care In-house Referral: Clinical Social Work Discharge Planning Services: CM Consult Post Acute Care Choice: Nursing Home Living arrangements for the past 2 months: Single Family Home Expected Discharge Date: 06/29/20                                     Social Determinants of Health (SDOH) Interventions    Readmission Risk Interventions No flowsheet data found.

## 2020-07-02 NOTE — Progress Notes (Signed)
Palliative Medicine RN Note: Discussed pt in team rounds. No active PMT needs, and TOC is working on disposition. At this time, PMT will sign off. Please re-consult [place] if needed.  Marjie Skiff Beva Remund, RN, BSN, [facility] Pall[facility] Team 07/02/2020 12:41 PM Office [phone]

## 2020-07-02 NOTE — Progress Notes (Signed)
Modified Barium Swallow Progress Note  Patient Details  Name: Patrick Phillips MRN: [id number] Date of Birth: Oct 20, 1940  Today's Date: 07/02/2020  Modified Barium Swallow completed.  Full report located under Chart Review in the Imaging Section.  Brief recommendations include the following:  Clinical Impression  Pt presented with oropharyngeal dysphagia characterized by impaired bolus control and a pharyngeal delay. He demonstrated premature spillage to the pyriform sinuses, penetration (PAS 5) and aspiration (PAS 7, 8) of thin liquids via cup and straw. Pt was unable to demonstrate compensatory strategies despite verbal prompts and cues and aspiration persisted despite use of 5cc and 10cc boluses. Laryngeal invasion was improved to PAS 2 with nectar thick liquids which is considered WNL. Aspiration resulted in an ineffective cough on two occasions, but all other instances were silent. Per EMR, pt has required augmentative IV fluids every few days due to poor p.o. intake, and SLP anticipates that use of nectar thick liquids to eliminate aspiration would likely not improve this. It is anticipated that the pt has had intermittent aspiration of thin liquids throughout this 21-day admission. Pt's daughter, [name], was contacted via phone and she reported that the pt had also been inconsistently symptomic of aspiration prior to admission so his aspiration may be chronic issue. Pt is ambulatory, afebrile, and all five of his CXRs have been negative for active disease. Pt's case was discussed with Dr. Waldron Labs including potential options for dysphagia management considering dehydration risk vs pulmonary risk. Considering his tolerance of intermittent aspiration thus far, it was agreed that his current diet will be continued with strict observance of swallowing precautions, consistent pt mobilization, and consistent oral care. Pt's daughter, [name], was educated regarding the pt's performance, concerns regarding  potential for dehydration due to avoidance of liquids, and the potential progression of dysphagia in the setting of dementia. All of her questions were answered and she verbalized agreement with plan of care.    Swallow Evaluation Recommendations       SLP Diet Recommendations: Dysphagia 3 (Mech soft) solids;Thin liquid   Liquid Administration via: Cup;No straw   Medication Administration: Whole meds with puree       Compensations: Minimize environmental distractions;Slow rate;Small sips/bites   Postural Changes: Seated upright at 90 degrees   Oral Care Recommendations: Oral care BID;Staff/trained caregiver to provide oral care       Nayelie Gionfriddo I. Hardin Negus, [place], [place] Office number [phone] Pager [place] 07/02/2020,3:16 PM

## 2020-07-03 DIAGNOSIS — G9341 Metabolic encephalopathy: Secondary | ICD-10-CM | POA: Diagnosis not present

## 2020-07-03 LAB — GLUCOSE, CAPILLARY
Glucose-Capillary: 104 mg/dL — ABNORMAL HIGH (ref 70–99)
Glucose-Capillary: 296 mg/dL — ABNORMAL HIGH (ref 70–99)
Glucose-Capillary: 213 mg/dL — ABNORMAL HIGH (ref 70–99)

## 2020-07-03 NOTE — Progress Notes (Signed)
PROGRESS NOTE                                                                                                                                                                                                             Patient Demographics:    Patrick Phillips, is a 80 y.o. male, DOB - 18-Feb-1941, [id number]  Outpatient Primary MD for the patient is [name], MD   Admit date - 06/10/2020   LOS - 24  Chief Complaint  Patient presents with  . Altered Mental Status       Brief Narrative: Patient is a 80 y.o. male with PMHx of vascular dementia, prior CVA, DM-2, HTN-who presented with fever and confusion.  COVID-19 vaccinated status:  Unknown  Significant Events:  4/14>> Admit to [facility] for fever/confusion  Significant studies:  4/14>>Chest x-ray: No PNA 4/14>> CT head: No acute intracranial abnormality. 4/14>> CT C-spine: No fracture 4/14>> MRI brain: No acute infarct. 4/15>> chest x-ray: No PNA 4/15>> CSF: WBC 1 (tube #4), protein 46, glucose 54 4/16>> EEG: Mild to moderate diffuse encephalopathy-no seizures.  COVID-19 medications:  Remdesivir: 4/15>>  Antibiotics:  Vancomycin: 4/14 x1 Ceftriaxone: 4/14 x 1 Ampicillin: 4/14 x 1  Microbiology data:  4/14 >>blood culture: No growth 4/14>> urine culture: No growth 4/15>> blood culture: No growth 4/15>> urine culture: Multiple species 4/15>> CSF culture: No growth 4/15>> CSF PCR HSV 1/2: Negative. 4/15>> CSF HSV culture: Negative 4/15>> CSF arbovirus IgG: Negative 4/15>> CSF VDRL: Negative RMSF CSF serology IgM -ve  Procedures:  4/15>> lumbar puncture in the emergency room  Consults:  Neurology, psychiatry, Pall.Care  DVT prophylaxis:  enoxaparin (LOVENOX) injection 40 mg Start: 06/11/20 0600    Subjective:   Patient in bed, no significant events overnight, he is with some episodes of restlessness.    Assessment  & Plan :   Acute  metabolic encephalopathy-superimposed on underlying Dementia with delirium:  - Encephalopathy on presentation was likely related to febrile illness/COVID-19 infection-all culture data negative so far.  CT head, MRI brain and EEG unremarkable. He has had no fever since admission-his encephalopathy I suspect his confusion/agitation now is related to delirium  associated with dementia. Neuro and Psych consulted. -Hospital delirium remains significant problem, but his medication has been adjusted frequently, but overall he seems to be doing better on Seroquel 50 mg nightly, Depakote 125 mg twice daily. - Evaluated by psychiatry and was initially started on Saphris and Depakote-  Saphris was stopped on 06/23/20 due to somnolence and was replaced by Seroquel, have reduced Depakote dose due to sedation along with Seroquel on 06/25/2020 with less somnolence, continue on the lower dose Seroquel with Depakote. -EEG with no evidence of seizure, just significant for encephalopathic. - Note mental status is waxing and waning now some bett.   ? Tick Bite: 2 ticks found by nursing staff on patient 06/17/20 - RMSF IgM - ve, DW ID Dr Tommy Medal 06/23/20 - not acute, no Rx.  AKI: Mild-likely hemodynamically mediated-resolved with IV fluids.  Dysphagia -Patient followed closely by SLP, had MBS done today, apparently he has been swallowing been aspirating thin liquids almost hallucinating, but so far no signs of pneumonia, will sign Juliane, increases risk of dehydration.  Borderline vitamin B12 deficiency: On supplementation.  Hypokalemia: Repleted-recheck periodically.  HTN: Currently on Coreg, Norvasc, added hydralazine for better control.  History of CVA-MRI brain negative x 2  - non acute-on aspirin/statin. Seen by Neuro, ? Code stroke 06/28/20 PM, doubt a true event, MRI negative again, stable exam, defer any more workup to Neuro.  Dementia: On Namenda  Moderate protein calorie malnutrition - seen by  nutritionist.  GERD: On PPI  SIRS-COVID-19 infection: Completed Remdesivir x3 days.  Not hypoxic-no pneumonia-does not require any further therapy.  Interestingly-patient apparently had COVID-19 infection in February as well.  Has finished his COVID isolation on 06/21/2020  No results for input(s): DDIMER, FERRITIN, LDH, CRP in the last 72 hours.  Lab Results  Component Value Date   [place] (A) 06/11/2020   [place] NEGATIVE 01/13/2020   DM-2: CBGs relatively stable-continue SSI-oral hypoglycemic agents on hold  Recent Labs    07/03/20 0740 07/03/20 1159 07/03/20 1617  GLUCAP 104* 296* 213*     Condition -   Guarded  Family Communication  : none at bedside  Code Status :  DNR  Diet :  Diet Order            Diet regular Room service appropriate? No; Fluid consistency: Thin  Diet effective now                  Disposition Plan  :   Status is: Inpatient  Remains inpatient appropriate because:Inpatient level of care appropriate due to severity of illness   Dispo: The patient is from: Home              Anticipated d/c is to: SNF              Patient currently is medically stable to d/c. awaiting SNF bed availability   Difficult to place patient No   Barriers to discharge: Resolving encephalopathy-awaiting final culture data.  Antimicorbials  :    Anti-infectives (From admission, onward)   Start     Dose/Rate Route Frequency Ordered Stop   06/18/20 1745  doxycycline (VIBRAMYCIN) 100 mg in sodium chloride 0.9 % 250 mL IVPB  Status:  Discontinued        100 mg 125 mL/hr over 120 Minutes Intravenous Every 12 hours 06/18/20 1648 06/23/20 0847   06/12/20 1000  remdesivir 100 mg in sodium chloride 0.9 % 100 mL IVPB  100 mg 200 mL/hr over 30 Minutes Intravenous Daily 06/11/20 0435 06/13/20 0900   06/11/20 0445  remdesivir 200 mg in sodium chloride 0.9% 250 mL IVPB        200 mg 580 mL/hr over 30 Minutes Intravenous Once 06/11/20 0434 06/11/20  0929   06/11/20 0230  vancomycin (VANCOREADY) IVPB 1500 mg/300 mL        1,500 mg 150 mL/hr over 120 Minutes Intravenous  Once 06/11/20 0229 06/11/20 0812   06/11/20 0215  ampicillin (OMNIPEN) 2 g in sodium chloride 0.9 % 100 mL IVPB        2 g 300 mL/hr over 20 Minutes Intravenous  Once 06/11/20 0157 06/11/20 0550   06/11/20 0215  vancomycin (VANCOREADY) IVPB 1000 mg/200 mL  Status:  Discontinued        1,000 mg 200 mL/hr over 60 Minutes Intravenous  Once 06/11/20 0157 06/11/20 0229   06/11/20 0200  cefTRIAXone (ROCEPHIN) 2 g in sodium chloride 0.9 % 100 mL IVPB        2 g 200 mL/hr over 30 Minutes Intravenous  Once 06/11/20 0157 06/11/20 0248      Inpatient Medications  Scheduled Meds: . amLODipine  10 mg Oral Daily  . aspirin EC  81 mg Oral Daily  . atorvastatin  40 mg Oral Daily  . carvedilol  6.25 mg Oral BID WC  . divalproex  125 mg Oral Q12H  . enoxaparin (LOVENOX) injection  40 mg Subcutaneous Q24H  . feeding supplement  237 mL Oral BID BM  . fluticasone  2 spray Each Nare Daily  . hydrALAZINE  50 mg Oral Q8H  . insulin aspart  0-9 Units Subcutaneous TID WC  . LORazepam  1 mg Intravenous Once  . multivitamin with minerals  1 tablet Oral Daily  . naLOXone (NARCAN)  injection  0.4 mg Intravenous Once  . pantoprazole  40 mg Oral Daily  . QUEtiapine  50 mg Oral Q2000  . senna  2 tablet Oral Daily  . tamsulosin  0.4 mg Oral Daily  . thiamine  100 mg Oral Daily  . vitamin B-12  1,000 mcg Oral Daily   Continuous Infusions:  PRN Meds:.acetaminophen, haloperidol lactate, hydrALAZINE, [DISCONTINUED] ondansetron **OR** ondansetron (ZOFRAN) IV, sodium chloride   See all Orders from today for further details   Phillips Climes M.D on 07/03/2020 at 5:24 PM  To page go to [email or web address] - use universal password  [facility] -  Office  [phone]    Objective:   Vitals:   07/02/20 0800 07/02/20 1600 07/02/20 2023 07/03/20 1204  BP: 140/63 (!) 121/56 139/67  137/64  Pulse: 75 86 83 77  Resp: 15 18 20 19   Temp: 98 F (36.7 C) 97.7 F (36.5 C) 98.7 F (37.1 C) 98 F (36.7 C)  TempSrc: Oral Oral Oral Oral  SpO2:  100% 99% 99%  Weight:      Height:        Wt Readings from Last 3 Encounters:  06/10/20 86.2 kg  06/10/20 86.2 kg  04/19/20 86.2 kg     Intake/Output Summary (Last 24 hours) at 07/03/2020 1724 Last data filed at 07/03/2020 1311 Gross per 24 hour  Intake 360 ml  Output 600 ml  Net -240 ml     Physical Exam  Awake , frail, confused, pleasant, communicative Symmetrical Chest wall movement, Good air movement bilaterally, CTAB RRR,No Gallops,Rubs or new Murmurs, No Parasternal Heave +ve B.Sounds, Abd Soft, No tenderness, No rebound - guarding or  rigidity. No Cyanosis, Clubbing or edema, No new Rash or bruise       Data Review:    CBC Recent Labs  Lab 06/27/20 0052 06/28/20 0044 07/02/20 0655  WBC 8.9 8.3 10.8*  HGB 11.3* 11.3* 12.5*  HCT 34.6* 34.0* 37.8*  PLT 211 232 254  MCV 86.3 86.5 85.1  MCH 28.2 28.8 28.2  MCHC 32.7 33.2 33.1  RDW 13.5 13.5 13.6  LYMPHSABS 3.0 2.7  --   MONOABS 0.7 0.6  --   EOSABS 0.1 0.1  --   BASOSABS 0.0 0.0  --     Chemistries  Recent Labs  Lab 06/27/20 0052 06/28/20 0044 07/02/20 0655  NA 138 138 137  K 3.7 3.6 3.9  CL 105 106 106  CO2 25 25 24   GLUCOSE 140* 139* 164*  BUN 13 11 14   CREATININE 1.03 1.10 0.93  CALCIUM 8.8* 8.8* 9.2  MG 2.0 2.0  --   AST 31 27  --   ALT 35 34  --   ALKPHOS 52 50  --   BILITOT 0.5 0.7  --    ------------------------------------------------------------------------------------------------------------------ No results for input(s): CHOL, HDL, LDLCALC, TRIG, CHOLHDL, LDLDIRECT in the last 72 hours.  Lab Results  Component Value Date   HGBA1C 6.6 (H) 06/11/2020   ------------------------------------------------------------------------------------------------------------------ No results for input(s): TSH, T4TOTAL, T3FREE,  THYROIDAB in the last 72 hours.  Invalid input(s): FREET3 ------------------------------------------------------------------------------------------------------------------ No results for input(s): VITAMINB12, FOLATE, FERRITIN, TIBC, IRON, RETICCTPCT in the last 72 hours.  Coagulation profile No results for input(s): INR, PROTIME in the last 168 hours.  No results for input(s): DDIMER in the last 72 hours.  Cardiac Enzymes No results for input(s): CKMB, TROPONINI, MYOGLOBIN in the last 168 hours.  Invalid input(s): CK ------------------------------------------------------------------------------------------------------------------    Component Value Date/Time   BNP 39.4 06/28/2020 0044    Micro Results No results found for this or any previous visit (from the past 240 hour(s)).  Radiology Reports CT Head Wo Contrast  Result Date: 06/10/2020 CLINICAL DATA:  Head and C-spine injury, fall, altered mental status EXAM: CT HEAD WITHOUT CONTRAST CT CERVICAL SPINE WITHOUT CONTRAST TECHNIQUE: Multidetector CT imaging of the head and cervical spine was performed following the standard protocol without intravenous contrast. Multiplanar CT image reconstructions of the cervical spine were also generated. COMPARISON:  05/04/2020 FINDINGS: CT HEAD FINDINGS Brain: Stable mild brain atrophy pattern. No acute intracranial hemorrhage, new mass lesion, acute infarction, midline shift, herniation, hydrocephalus, or extra-axial fluid collection. No focal mass effect or edema. Cisterns are patent. Cerebellar atrophy as well. Vascular: Intracranial atherosclerosis at the skull base. No hyperdense vessel. Skull: Normal. Negative for fracture or focal lesion. Sinuses/Orbits: Minor scattered sinus mucosal thickening as before. Orbits are symmetric. No acute finding. Other: None. CT CERVICAL SPINE FINDINGS Alignment: Normal. Skull base and vertebrae: No acute fracture. No primary bone lesion or focal pathologic  process. Soft tissues and spinal canal: Normal prevertebral soft tissues. No hemorrhage or hematoma appreciated. Carotid atherosclerosis. Disc levels: Similar mild multilevel degenerative disc disease and facet arthropathy. Facets are aligned. No subluxation or dislocation. Preserved vertebral body heights. Upper chest: Negative. Other: None. IMPRESSION: Stable brain atrophy pattern. No acute intracranial abnormality or interval change by noncontrast CT. Stable mild cervical degenerative changes as above. No acute osseous finding or malalignment by CT. Electronically Signed   By: Jerilynn Mages.  Shick M.D.   On: 06/10/2020 12:31   CT Cervical Spine Wo Contrast  Result Date: 06/10/2020 CLINICAL DATA:  Head and C-spine  injury, fall, altered mental status EXAM: CT HEAD WITHOUT CONTRAST CT CERVICAL SPINE WITHOUT CONTRAST TECHNIQUE: Multidetector CT imaging of the head and cervical spine was performed following the standard protocol without intravenous contrast. Multiplanar CT image reconstructions of the cervical spine were also generated. COMPARISON:  05/04/2020 FINDINGS: CT HEAD FINDINGS Brain: Stable mild brain atrophy pattern. No acute intracranial hemorrhage, new mass lesion, acute infarction, midline shift, herniation, hydrocephalus, or extra-axial fluid collection. No focal mass effect or edema. Cisterns are patent. Cerebellar atrophy as well. Vascular: Intracranial atherosclerosis at the skull base. No hyperdense vessel. Skull: Normal. Negative for fracture or focal lesion. Sinuses/Orbits: Minor scattered sinus mucosal thickening as before. Orbits are symmetric. No acute finding. Other: None. CT CERVICAL SPINE FINDINGS Alignment: Normal. Skull base and vertebrae: No acute fracture. No primary bone lesion or focal pathologic process. Soft tissues and spinal canal: Normal prevertebral soft tissues. No hemorrhage or hematoma appreciated. Carotid atherosclerosis. Disc levels: Similar mild multilevel degenerative disc  disease and facet arthropathy. Facets are aligned. No subluxation or dislocation. Preserved vertebral body heights. Upper chest: Negative. Other: None. IMPRESSION: Stable brain atrophy pattern. No acute intracranial abnormality or interval change by noncontrast CT. Stable mild cervical degenerative changes as above. No acute osseous finding or malalignment by CT. Electronically Signed   By: Jerilynn Mages.  Shick M.D.   On: 06/10/2020 12:31   MR BRAIN WO CONTRAST  Result Date: 06/29/2020 CLINICAL DATA:  Right-sided facial drooping, altered mental status. EXAM: MRI HEAD WITHOUT CONTRAST TECHNIQUE: Multiplanar, multiecho pulse sequences of the brain and surrounding structures were obtained without intravenous contrast. COMPARISON:  June 10, 2020. FINDINGS: Brain: No acute infarction, hemorrhage, hydrocephalus, extra-axial collection or mass lesion. Dilated perivascular space in inferior right basal ganglia. No substantial white matter disease for patient age. Vascular: Partial loss of flow void in the distal left vertebral artery, which could be due to stenosis. Normal flow void in the visualized proximal aICAs. Skull and upper cervical spine: Normal marrow signal. Sinuses/Orbits: Mild mucosal thickening of the frontal sinuses and moderate mucosal thickening of scattered ethmoid air cells. Inferior left maxillary sinus mucous retention cyst. Bilateral exopthalmos, similar to prior. Other: No mastoid effusions. IMPRESSION: 1. No evidence of acute abnormality. 2. Partial loss of flow void in the distal left vertebral artery, similar to prior and possibly due to stenosis. Consider CT angio head and neck to further evaluate. 3. Frontoethmoidal paranasal sinus mucosal thickening. Electronically Signed   By: Margaretha Sheffield MD   On: 06/29/2020 07:47   MR Brain Wo Contrast (neuro protocol)  Result Date: 06/10/2020 CLINICAL DATA:  Acute neuro deficit.  Mental status change. EXAM: MRI HEAD WITHOUT CONTRAST TECHNIQUE: Multiplanar,  multiecho pulse sequences of the brain and surrounding structures were obtained without intravenous contrast. COMPARISON:  CT head 06/10/2020 FINDINGS: Brain: Ventricle size and cerebral volume normal for age. Negative for acute infarct. No significant chronic ischemic change. Negative for hemorrhage or mass. Image quality degraded by mild motion. Vascular: Partial loss of flow void in the left internal carotid artery through the skull base and the distal left vertebral artery which may be due to slow flow or stenosis. Skull and upper cervical spine: Negative Sinuses/Orbits: Mild mucosal edema paranasal sinuses. No orbital mass. Bilateral exophthalmos. Other: None IMPRESSION: Negative for acute infarct. Partial loss of flow void in the left internal carotid artery distal left vertebral artery which could be due to proximal stenosis. Consider CT angio head and neck for further evaluation. Electronically Signed   By: Franchot Gallo  M.D.   On: 06/10/2020 15:09   DG Chest Port 1 View  Result Date: 06/25/2020 CLINICAL DATA:  Shortness of breath and lethargy. EXAM: PORTABLE CHEST 1 VIEW COMPARISON:  Chest x-ray 06/24/2020 FINDINGS: The cardiac silhouette, mediastinal and hilar contours are within limits. Stable mild tortuosity and calcification the thoracic aorta. The lungs are clear of an acute process. No pleural effusions or pulmonary lesions. The bony thorax is intact. IMPRESSION: No acute cardiopulmonary findings. Electronically Signed   By: Marijo Sanes M.D.   On: 06/25/2020 14:15   DG Chest Port 1 View  Result Date: 06/24/2020 CLINICAL DATA:  Shortness of breath. EXAM: PORTABLE CHEST 1 VIEW COMPARISON:  June 20, 2020. FINDINGS: The heart size and mediastinal contours are within normal limits. Both lungs are clear. The visualized skeletal structures are unremarkable. IMPRESSION: No active disease. Electronically Signed   By: Marijo Conception M.D.   On: 06/24/2020 08:13   DG Chest Port 1 View  Result  Date: 06/20/2020 CLINICAL DATA:  Shortness of breath EXAM: PORTABLE CHEST 1 VIEW COMPARISON:  06/11/2020 FINDINGS: Loop recorder is identified adjacent to the cardiac apex. Normal heart size. No pleural effusion or edema. No airspace densities identified. No acute osseous findings. IMPRESSION: No acute cardiopulmonary abnormalities. Electronically Signed   By: Kerby Moors M.D.   On: 06/20/2020 08:34   DG Chest Port 1 View  Result Date: 06/11/2020 CLINICAL DATA:  Questionable sepsis. EXAM: PORTABLE CHEST 1 VIEW COMPARISON:  June 10, 2020 FINDINGS: There is no evidence of acute infiltrate, pleural effusion or pneumothorax. The heart size and mediastinal contours are within normal limits. A radiopaque loop recorder device is seen. The visualized skeletal structures are unremarkable. IMPRESSION: No active disease. Electronically Signed   By: Virgina Norfolk M.D.   On: 06/11/2020 03:10   DG Chest Port 1 View  Result Date: 06/10/2020 CLINICAL DATA:  Altered mental status. EXAM: PORTABLE CHEST 1 VIEW COMPARISON:  May 25, 2020 FINDINGS: Loop recorder in place. Cardiomediastinal silhouette is normal. Mediastinal contours appear intact. Tortuosity and calcific atherosclerotic disease of the aorta. There is no evidence of focal airspace consolidation, pleural effusion or pneumothorax. Osseous structures are without acute abnormality. Soft tissues are grossly normal. IMPRESSION: No active disease. Electronically Signed   By: Fidela Salisbury M.D.   On: 06/10/2020 11:15   DG Swallowing Func-Speech Pathology  Result Date: 07/02/2020 Objective Swallowing Evaluation: Type of Study: Bedside Swallow Evaluation  Patient Details Name: Clarissa Acha MRN: [id number] Date of Birth: 1940/06/07 Today's Date: 07/02/2020 Time: SLP Start Time (ACUTE ONLY): 50 -SLP Stop Time (ACUTE ONLY): 1249 SLP Time Calculation (min) (ACUTE ONLY): 19 min Past Medical History: Past Medical History: Diagnosis Date . Cancer ([place])   skin cancer  . Dementia ([place])  . Diabetes mellitus without complication ([place])  . Hypertension  . Memory deficit  Past Surgical History: Past Surgical History: Procedure Laterality Date . BACK SURGERY   . HERNIA REPAIR   . LOOP RECORDER INSERTION N/A 07/26/2017  Procedure: LOOP RECORDER INSERTION;  Surgeon: Evans Lance, MD;  Location: [place] CV LAB;  Service: Cardiovascular;  Laterality: N/A; . TEE WITHOUT CARDIOVERSION N/A 07/26/2017  Procedure: TRANSESOPHAGEAL ECHOCARDIOGRAM (TEE);  Surgeon: Fay Records, MD;  Location: [facility] ENDOSCOPY;  Service: Cardiovascular;  Laterality: N/A; . TRIGGER FINGER RELEASE Right 12/02/2015  Procedure: RIGHT THUMB RELEASE TRIGGER FINGER/A-1 PULLEY;  Surgeon: Leanora Cover, MD;  Location: [place];  Service: Orthopedics;  Laterality: Right; HPI: Pt is a 80 y.o. male  with PMHx of vascular dementia, CVA, DM-2, HTN. On 4/14, pt took his dog for a walk and the dog returned home without him. Pt was found lying on the side of road with no memory of what had happened but able to follow commands. Pt was taken to [facility] and d/c home with daughter following negative workup, but pt was brough to [facility] ED due to increased confusion. Pt found to be febrile and COVID+ and admitted for acute metabolic encephalopathy. MRI brain 4/14 negative for acute changes. CXR 4/15 negative for active disease. EEG 4/16: mild to moderate diffuse encephalopathy, nonspecific etiology. No seizures or epileptiform discharges. Psych consulted due to severe delirium.  No data recorded Assessment / Plan / Recommendation CHL IP CLINICAL IMPRESSIONS 07/02/2020 Clinical Impression Pt presented with oropharyngeal dysphagia characterized by impaired bolus control and a pharyngeal delay. He demonstrated premature spillage to the pyriform sinuses, penetration (PAS 5) and aspiration (PAS 7, 8) of thin liquids via cup and straw. Pt was unable to demonstrate compensatory strategies despite verbal prompts and cues and aspiration  persisted despite use of 5cc and 10cc boluses. Laryngeal invasion was improved to PAS 2 with nectar thick liquids which is considered WNL. Aspiration resulted in an ineffective cough on two occasions, but all other instances were silent. Per EMR, pt has required augmentative IV fluids every few days due to poor p.o. intake, and SLP anticipates that use of nectar thick liquids to eliminate aspiration would likely not improve this. It is anticipated that the pt has had intermittent aspiration of thin liquids throughout this 21-day admission. Pt's daughter, [name], was contacted via phone and she reported that the pt had also been inconsistently symptomic of aspiration prior to admission so his aspiration may be chronic issue. Pt is ambulatory, afebrile, and all five of his CXRs have been negative for active disease. Pt's case was discussed with Dr. Waldron Labs including potential options for dysphagia management considering dehydration risk vs pulmonary risk. Considering his tolerance of intermittent aspiration thus far, it was agreed that his current diet will be continued with strict observance of swallowing precautions, consistent pt mobilization, and consistent oral care. Pt's daughter, [name], was educated regarding the pt's performance, concerns regarding potential for dehydration due to avoidance of liquids, and the potential progression of dysphagia in the setting of dementia. All of her questions were answered and she verbalized agreement with plan of care. SLP Visit Diagnosis Dysphagia, oropharyngeal phase (R13.12) Attention and concentration deficit following -- Frontal lobe and executive function deficit following -- Impact on safety and function Mild aspiration risk;Moderate aspiration risk   CHL IP TREATMENT RECOMMENDATION 07/02/2020 Treatment Recommendations Therapy as outlined in treatment plan below   Prognosis 07/02/2020 Prognosis for Safe Diet Advancement Fair Barriers to Reach Goals Cognitive deficits  Barriers/Prognosis Comment -- CHL IP DIET RECOMMENDATION 07/02/2020 SLP Diet Recommendations Dysphagia 3 (Mech soft) solids;Thin liquid Liquid Administration via Cup;No straw Medication Administration Whole meds with puree Compensations Minimize environmental distractions;Slow rate;Small sips/bites Postural Changes Seated upright at 90 degrees   CHL IP OTHER RECOMMENDATIONS 07/02/2020 Recommended Consults -- Oral Care Recommendations Oral care BID;Staff/trained caregiver to provide oral care Other Recommendations --   CHL IP FOLLOW UP RECOMMENDATIONS 07/02/2020 Follow up Recommendations Skilled Nursing facility   [facility] IP FREQUENCY AND DURATION 07/02/2020 Speech Therapy Frequency (ACUTE ONLY) min 1 x/week Treatment Duration 2 weeks      CHL IP ORAL PHASE 07/02/2020 Oral Phase Impaired Oral - Pudding Teaspoon -- Oral - Pudding Cup -- Oral - Honey  Teaspoon -- Oral - Honey Cup -- Oral - Nectar Teaspoon -- Oral - Nectar Cup -- Oral - Nectar Straw Premature spillage;Decreased bolus cohesion Oral - Thin Teaspoon -- Oral - Thin Cup Premature spillage;Decreased bolus cohesion Oral - Thin Straw Premature spillage;Decreased bolus cohesion Oral - Puree Decreased bolus cohesion;Premature spillage Oral - Mech Soft -- Oral - Regular WFL Oral - Multi-Consistency -- Oral - Pill Decreased bolus cohesion;Premature spillage Oral Phase - Comment --  CHL IP PHARYNGEAL PHASE 07/02/2020 Pharyngeal Phase Impaired Pharyngeal- Pudding Teaspoon -- Pharyngeal -- Pharyngeal- Pudding Cup -- Pharyngeal -- Pharyngeal- Honey Teaspoon -- Pharyngeal -- Pharyngeal- Honey Cup -- Pharyngeal -- Pharyngeal- Nectar Teaspoon -- Pharyngeal -- Pharyngeal- Nectar Cup Delayed swallow initiation-vallecula Pharyngeal -- Pharyngeal- Nectar Straw Delayed swallow initiation-vallecula;Delayed swallow initiation-pyriform sinuses;Penetration/Aspiration during swallow Pharyngeal Material enters airway, remains ABOVE vocal cords then ejected out Pharyngeal- Thin Teaspoon -- Pharyngeal  -- Pharyngeal- Thin Cup Delayed swallow initiation-pyriform sinuses;Penetration/Aspiration during swallow;Penetration/Apiration after swallow Pharyngeal Material enters airway, CONTACTS cords and not ejected out;Material enters airway, passes BELOW cords and not ejected out despite cough attempt by patient;Material enters airway, passes BELOW cords without attempt by patient to eject out (silent aspiration) Pharyngeal- Thin Straw Delayed swallow initiation-pyriform sinuses;Penetration/Aspiration during swallow;Penetration/Apiration after swallow Pharyngeal Material enters airway, CONTACTS cords and not ejected out;Material enters airway, passes BELOW cords without attempt by patient to eject out (silent aspiration);Material enters airway, passes BELOW cords and not ejected out despite cough attempt by patient Pharyngeal- Puree Delayed swallow initiation-vallecula Pharyngeal -- Pharyngeal- Mechanical Soft -- Pharyngeal -- Pharyngeal- Regular Delayed swallow initiation-vallecula Pharyngeal -- Pharyngeal- Multi-consistency -- Pharyngeal -- Pharyngeal- Pill Delayed swallow initiation-vallecula Pharyngeal -- Pharyngeal Comment --  CHL IP CERVICAL ESOPHAGEAL PHASE 07/02/2020 Cervical Esophageal Phase WFL Pudding Teaspoon -- Pudding Cup -- Honey Teaspoon -- Honey Cup -- Nectar Teaspoon -- Nectar Cup -- Nectar Straw -- Thin Teaspoon -- Thin Cup -- Thin Straw -- Puree -- Mechanical Soft -- Regular -- Multi-consistency -- Pill -- Cervical Esophageal Comment -- Shanika I. Hardin Negus, [place], [place] Office number [phone] Pager [phone] Horton Marshall 07/02/2020, 3:23 PM              EEG adult  Result Date: 06/29/2020 Lora Havens, MD     06/29/2020  1:23 PM Patient Name: Patrick Phillips MRN: [id number] Epilepsy Attending: Lora Havens Referring Physician/Provider: Dr Amie [place] Date: 06/29/2020 Duration: 21.43 mins Patient history: 80 year old male with waxing and waning mental status.   EEG to evaluate for seizures. Level of alertness: Awake AEDs during EEG study: Depakote Technical aspects: This EEG study was done with scalp electrodes positioned according to the 10-20 International system of electrode placement. Electrical activity was acquired at a sampling rate of 500Hz  and reviewed with a high frequency filter of 70Hz  and a low frequency filter of 1Hz . EEG data were recorded continuously and digitally stored. Description: The posterior dominant rhythm consists of 8-9 Hz activity of moderate voltage (25-35 uV) seen predominantly in posterior head regions, symmetric and reactive to eye opening and eye closing. EEG showed intermittent generalized 5 to 6 Hz theta slowing. Hyperventilation and photic stimulation were not performed.   ABNORMALITY - Intermittent slow, generalized IMPRESSION: This study is suggestive of mild diffuse encephalopathy, nonspecific etiology. No seizures or epileptiform discharges were seen throughout the recording. Lora Havens   EEG adult  Result Date: 06/12/2020 Lora Havens, MD     06/12/2020  3:07 PM Patient Name: Braydon Wisham MRN: [id number] Epilepsy Attending:  Lora Havens Referring Physician/Provider: Dr Oren Binet Date: 06/12/2020 Duration: 25.42 mins Patient history: 80yo M with ams. EEG to evaluate for seizure Level of alertness: Awake AEDs during EEG study: None Technical aspects: This EEG study was done with scalp electrodes positioned according to the 10-20 International system of electrode placement. Electrical activity was acquired at a sampling rate of 500Hz  and reviewed with a high frequency filter of 70Hz  and a low frequency filter of 1Hz . EEG data were recorded continuously and digitally stored. Description: No posterior dominant rhythm was seen. EEG showed continuous generalized 5 to 6 Hz theta slowing. Hyperventilation and photic stimulation were not performed.   ABNORMALITY - Continuous slow, generalized IMPRESSION: This study is  suggestive of mild to moderate diffuse encephalopathy, nonspecific etiology. No seizures or epileptiform discharges were seen throughout the recording. Priyanka Barbra Sarks   DG Hips Bilat W or Wo Pelvis 3-4 Views  Result Date: 06/10/2020 CLINICAL DATA:  Found on side of road, last seen normal at 2130 hours last night, fall, dementia, tenderness of hips bilaterally EXAM: DG HIP (WITH OR WITHOUT PELVIS) 3-4V BILAT COMPARISON:  None FINDINGS: Osseous demineralization. Hip and SI joint spaces preserved. No acute fracture, dislocation, or bone destruction. Mild degenerative disc disease changes at visualized lower lumbar spine. IMPRESSION: No acute osseous abnormalities. Electronically Signed   By: Lavonia Dana M.D.   On: 06/10/2020 11:48   CT HEAD CODE STROKE WO CONTRAST`  Result Date: 06/28/2020 CLINICAL DATA:  Code stroke.  Confusion.  Altered mental status. EXAM: CT HEAD WITHOUT CONTRAST TECHNIQUE: Contiguous axial images were obtained from the base of the skull through the vertex without intravenous contrast. COMPARISON:  MRI 06/10/2020 FINDINGS: Brain: Age related volume loss. No evidence of old or acute small or large vessel infarction. No mass lesion, hemorrhage, hydrocephalus or extra-axial collection. Dilated perivascular space at the base of the brain. Vascular: There is atherosclerotic calcification of the major vessels at the base of the brain. Skull: Negative Sinuses/Orbits: Clear/normal Other: None ASPECTS ([place] Stroke Program Early CT Score) - Ganglionic level infarction (caudate, lentiform nuclei, internal capsule, insula, M1-M3 cortex): 7 - Supraganglionic infarction (M4-M6 cortex): 3 Total score (0-10 with 10 being normal): 10 IMPRESSION: 1. Normal head CT for age. Atherosclerotic calcification of the major vessels at base of the brain. 2. ASPECTS is 10 3. These results were communicated to Dr. Rory Percy at 7:18 pmon 5/2/2022by text page via the [facility] messaging system. Electronically Signed   By:  Nelson Chimes M.D.   On: 06/28/2020 19:19

## 2020-07-03 NOTE — Plan of Care (Signed)
  Problem: Safety: Goal: Non-violent Restraint(s) Outcome: Progressing   Problem: Education: Goal: Knowledge of General Education information will improve Description: Including pain rating scale, medication(s)/side effects and non-pharmacologic comfort measures Outcome: Not Progressing   Problem: Health Behavior/Discharge Planning: Goal: Ability to manage health-related needs will improve Outcome: Not Progressing   Problem: Clinical Measurements: Goal: Ability to maintain clinical measurements within normal limits will improve Outcome: Progressing Goal: Will remain free from infection Outcome: Progressing Goal: Diagnostic test results will improve Outcome: Progressing Goal: Respiratory complications will improve Outcome: Progressing Goal: Cardiovascular complication will be avoided Outcome: Progressing   Problem: Activity: Goal: Risk for activity intolerance will decrease Outcome: Progressing   Problem: Nutrition: Goal: Adequate nutrition will be maintained Outcome: Progressing   Problem: Coping: Goal: Level of anxiety will decrease Outcome: Progressing   Problem: Elimination: Goal: Will not experience complications related to bowel motility Outcome: Progressing Goal: Will not experience complications related to urinary retention Outcome: Not Progressing   Problem: Pain Managment: Goal: General experience of comfort will improve Outcome: Progressing   Problem: Safety: Goal: Ability to remain free from injury will improve Outcome: Progressing   Problem: Skin Integrity: Goal: Risk for impaired skin integrity will decrease Outcome: Progressing   Problem: Education: Goal: Knowledge of risk factors and measures for prevention of condition will improve Outcome: Not Progressing   Problem: Coping: Goal: Psychosocial and spiritual needs will be supported Outcome: Progressing   Problem: Respiratory: Goal: Will maintain a patent airway Outcome:  Progressing Goal: Complications related to the disease process, condition or treatment will be avoided or minimized Outcome: Progressing

## 2020-07-03 NOTE — Progress Notes (Signed)
I assumed care of this patient at approximately 2100. Patient not following commands and using coarse language with staff, facilitating a change in patient assignments and the implementation of restraints. Patient assessed. Cognitively, patient appears to have an increase in his confusion. At times he believes he is at home or in another location other than the hospital. Patient has full range of motion and is sleeping at this time. Will continue to monitor.

## 2020-07-03 NOTE — Progress Notes (Signed)
Cross-coverage note:   Patient agitated and combative despite non-pharmacologic and pharmacologic interventions. Plan to use restraints, frequently reassess, and discontinue restraints as soon as possible.

## 2020-07-04 ENCOUNTER — Inpatient Hospital Stay (HOSPITAL_COMMUNITY): Payer: Medicare Other

## 2020-07-04 DIAGNOSIS — G9341 Metabolic encephalopathy: Secondary | ICD-10-CM | POA: Diagnosis not present

## 2020-07-04 LAB — GLUCOSE, CAPILLARY
Glucose-Capillary: 136 mg/dL — ABNORMAL HIGH (ref 70–99)
Glucose-Capillary: 249 mg/dL — ABNORMAL HIGH (ref 70–99)
Glucose-Capillary: 223 mg/dL — ABNORMAL HIGH (ref 70–99)
Glucose-Capillary: 141 mg/dL — ABNORMAL HIGH (ref 70–99)

## 2020-07-04 MED ORDER — DIVALPROEX SODIUM 125 MG PO CSDR
250.0000 mg | DELAYED_RELEASE_CAPSULE | Freq: Two times a day (BID) | ORAL | Status: DC
  Administered 2020-07-04 – 2020-07-14 (×20): 250 mg via ORAL
  Filled 2020-07-04 (×20): qty 2

## 2020-07-04 MED ORDER — LORAZEPAM 2 MG/ML IJ SOLN
0.5000 mg | Freq: Once | INTRAMUSCULAR | Status: AC
  Administered 2020-07-10: 0.5 mg via INTRAVENOUS
  Filled 2020-07-04 (×2): qty 1

## 2020-07-04 NOTE — Progress Notes (Signed)
Cross-coverage note:   Patient seen for acute-onset SOB, feels like he can't breathe.   He does not appear to be in any distress, no diaphoresis or accessory muscle use. Vitals normal on room air.   Plan to check EKG and CXR.

## 2020-07-04 NOTE — Progress Notes (Signed)
I spoke with Misty, the patient's daughter, about the patient being placed in restraints d/t unsafe behavior. I educated Misty about the protocols we have in place when taking care of patients in restraints.  Misty verbalized understanding. Misty shared with me that during her visit with the patient on 5/7 she noticed when the patient used the restroom, he had an inconsistent stream. She stated "it didn't sound right. It sounded like it was stopping and starting." I told Misty that I would document her observation and share the information with the on-coming RN at shift change. Will continue to monitor.

## 2020-07-04 NOTE — Progress Notes (Addendum)
PROGRESS NOTE                                                                                                                                                                                                             Patient Demographics:    Patrick Phillips, is a 80 y.o. male, DOB - 1940-12-12, JXB:[id number]  Outpatient Primary MD for the patient is [name], MD   Admit date - 06/10/2020   LOS - 23  Chief Complaint  Patient presents with  . Altered Mental Status       Brief Narrative: Patient is a 80 y.o. male with PMHx of vascular dementia, prior CVA, DM-2, HTN-who presented with fever and confusion.  COVID-19 vaccinated status:  Unknown  Significant Events:  4/14>> Admit to [facility] for fever/confusion  Significant studies:  4/14>>Chest x-ray: No PNA 4/14>> CT head: No acute intracranial abnormality. 4/14>> CT C-spine: No fracture 4/14>> MRI brain: No acute infarct. 4/15>> chest x-ray: No PNA 4/15>> CSF: WBC 1 (tube #4), protein 46, glucose 54 4/16>> EEG: Mild to moderate diffuse encephalopathy-no seizures.  COVID-19 medications:  Remdesivir: 4/15>>  Antibiotics:  Vancomycin: 4/14 x1 Ceftriaxone: 4/14 x 1 Ampicillin: 4/14 x 1  Microbiology data:  4/14 >>blood culture: No growth 4/14>> urine culture: No growth 4/15>> blood culture: No growth 4/15>> urine culture: Multiple species 4/15>> CSF culture: No growth 4/15>> CSF PCR HSV 1/2: Negative. 4/15>> CSF HSV culture: Negative 4/15>> CSF arbovirus IgG: Negative 4/15>> CSF VDRL: Negative RMSF CSF serology IgM -ve  Procedures:  4/15>> lumbar puncture in the emergency room  Consults:  Neurology, psychiatry, Pall.Care  DVT prophylaxis:  enoxaparin (LOVENOX) injection 40 mg Start: 06/11/20 0600    Subjective:   Patient in bed, and episode of agitation overnight where he required to be on restraints.    Assessment  & Plan :   Acute  metabolic encephalopathy-superimposed on underlying Dementia with delirium:  - Encephalopathy on presentation was likely related to febrile illness/COVID-19 infection-all culture data negative so far.  CT head, MRI brain and EEG unremarkable. He has had no fever since admission-his encephalopathy I suspect his confusion/agitation now is related to  delirium associated with dementia. Neuro and Psych consulted. -Hospital delirium remains significant problem, but his medication has been adjusted frequently, but overall he seems to be doing better on Seroquel 50 mg nightly, Depakote 125 mg twice daily. - Evaluated by psychiatry and was initially started on Saphris and Depakote-  Saphris was stopped on 06/23/20 due to somnolence and was replaced by Seroquel, have reduced Depakote dose due to sedation along with Seroquel on 06/25/2020 with less somnolence, he is with more frequent episodes of agitation and combativeness, so I will increase his Depakote to 250 mg p.o. twice daily, will continue on current dose Seroquel 50 mg at bedtime.. -EEG with no evidence of seizure, just significant for encephalopathic. - Note mental status is waxing and waning now some bett.   ? Tick Bite: 2 ticks found by nursing staff on patient 06/17/20 - RMSF IgM - ve, DW ID Dr Tommy Medal 06/23/20 - not acute, no Rx.  AKI: Mild-likely hemodynamically mediated-resolved with IV fluids.  Dysphagia -Patient followed closely by SLP, had MBS done 5/8, he has been aspirating on thin liquids, he is currently asymptomatic, with no signs of pneumonia, I have discussed with family, at this point we will keep him on current diet will monitor closely for signs of infection, as he is high risk for dehydration if fluid consistency changed .  Borderline vitamin B12 deficiency: On supplementation.  Hypokalemia: Repleted-recheck periodically.  HTN: Currently on Coreg, Norvasc, added hydralazine for better control.  History of CVA-MRI brain negative x  2  - non acute-on aspirin/statin. Seen by Neuro, ? Code stroke 06/28/20 PM, doubt a true event, MRI negative again, stable exam, defer any more workup to Neuro.  Dementia: On Namenda  Moderate protein calorie malnutrition - seen by nutritionist.  GERD: On PPI  SIRS-COVID-19 infection: Completed Remdesivir x3 days.  Not hypoxic-no pneumonia-does not require any further therapy.  Interestingly-patient apparently had COVID-19 infection in February as well.  Has finished his COVID isolation on 06/21/2020  No results for input(s): DDIMER, FERRITIN, LDH, CRP in the last 72 hours.  Lab Results  Component Value Date   [place] (A) 06/11/2020   [place] NEGATIVE 01/13/2020   DM-2: CBGs relatively stable-continue SSI-oral hypoglycemic agents on hold  Recent Labs    07/03/20 1617 07/04/20 0734 07/04/20 1245  GLUCAP 213* 136* 249*     Condition -   Guarded  Family Communication  : Discussed with daughter at bedside 5/7  Code Status :  DNR  Diet :  Diet Order            Diet regular Room service appropriate? No; Fluid consistency: Thin  Diet effective now                  Disposition Plan  :   Status is: Inpatient  Remains inpatient appropriate because:Inpatient level of care appropriate due to severity of illness   Dispo: The patient is from: Home              Anticipated d/c is to: SNF              Patient currently is medically stable to d/c. awaiting SNF bed availability   Difficult to place patient No   Barriers to discharge: Resolving encephalopathy-awaiting final culture data.  Antimicorbials  :    Anti-infectives (From admission, onward)   Start     Dose/Rate Route Frequency Ordered Stop   06/18/20 1745  doxycycline (VIBRAMYCIN) 100 mg in sodium chloride 0.9 %  250 mL IVPB  Status:  Discontinued        100 mg 125 mL/hr over 120 Minutes Intravenous Every 12 hours 06/18/20 1648 06/23/20 0847   06/12/20 1000  remdesivir 100 mg in sodium chloride  0.9 % 100 mL IVPB        100 mg 200 mL/hr over 30 Minutes Intravenous Daily 06/11/20 0435 06/13/20 0900   06/11/20 0445  remdesivir 200 mg in sodium chloride 0.9% 250 mL IVPB        200 mg 580 mL/hr over 30 Minutes Intravenous Once 06/11/20 0434 06/11/20 0929   06/11/20 0230  vancomycin (VANCOREADY) IVPB 1500 mg/300 mL        1,500 mg 150 mL/hr over 120 Minutes Intravenous  Once 06/11/20 0229 06/11/20 0812   06/11/20 0215  ampicillin (OMNIPEN) 2 g in sodium chloride 0.9 % 100 mL IVPB        2 g 300 mL/hr over 20 Minutes Intravenous  Once 06/11/20 0157 06/11/20 0550   06/11/20 0215  vancomycin (VANCOREADY) IVPB 1000 mg/200 mL  Status:  Discontinued        1,000 mg 200 mL/hr over 60 Minutes Intravenous  Once 06/11/20 0157 06/11/20 0229   06/11/20 0200  cefTRIAXone (ROCEPHIN) 2 g in sodium chloride 0.9 % 100 mL IVPB        2 g 200 mL/hr over 30 Minutes Intravenous  Once 06/11/20 0157 06/11/20 0248      Inpatient Medications  Scheduled Meds: . amLODipine  10 mg Oral Daily  . aspirin EC  81 mg Oral Daily  . atorvastatin  40 mg Oral Daily  . carvedilol  6.25 mg Oral BID WC  . divalproex  250 mg Oral Q12H  . enoxaparin (LOVENOX) injection  40 mg Subcutaneous Q24H  . feeding supplement  237 mL Oral BID BM  . fluticasone  2 spray Each Nare Daily  . hydrALAZINE  50 mg Oral Q8H  . insulin aspart  0-9 Units Subcutaneous TID WC  . LORazepam  1 mg Intravenous Once  . multivitamin with minerals  1 tablet Oral Daily  . naLOXone (NARCAN)  injection  0.4 mg Intravenous Once  . pantoprazole  40 mg Oral Daily  . QUEtiapine  50 mg Oral Q2000  . senna  2 tablet Oral Daily  . tamsulosin  0.4 mg Oral Daily  . thiamine  100 mg Oral Daily  . vitamin B-12  1,000 mcg Oral Daily   Continuous Infusions:  PRN Meds:.acetaminophen, haloperidol lactate, hydrALAZINE, [DISCONTINUED] ondansetron **OR** ondansetron (ZOFRAN) IV, sodium chloride   See all Orders from today for further details   Phillips Climes M.D on 07/04/2020 at 3:09 PM  To page go to [email or web address] - use universal password  [facility] -  Office  [phone]    Objective:   Vitals:   07/02/20 2023 07/03/20 1204 07/03/20 1944 07/04/20 0640  BP: 139/67 137/64 (!) 145/92 (!) 160/72  Pulse: 83 77 81 76  Resp: 20 19 18 20   Temp: 98.7 F (37.1 C) 98 F (36.7 C) 98 F (36.7 C) 98.4 F (36.9 C)  TempSrc: Oral Oral Oral Oral  SpO2: 99% 99% 98% 97%  Weight:      Height:        Wt Readings from Last 3 Encounters:  06/10/20 86.2 kg  06/10/20 86.2 kg  04/19/20 86.2 kg     Intake/Output Summary (Last 24 hours) at 07/04/2020 1509 Last data filed at 07/04/2020 1317 Gross per 24 hour  Intake 120 ml  Output 1161 ml  Net -1041 ml     Physical Exam  Awake , frail, confused, pleasant, communicative Symmetrical Chest wall movement, Good air movement bilaterally, CTAB RRR,No Gallops,Rubs or new Murmurs, No Parasternal Heave +ve B.Sounds, Abd Soft, No tenderness, No rebound - guarding or rigidity. No Cyanosis, Clubbing or edema, No new Rash or bruise      Data Review:    CBC Recent Labs  Lab 06/28/20 0044 07/02/20 0655  WBC 8.3 10.8*  HGB 11.3* 12.5*  HCT 34.0* 37.8*  PLT 232 254  MCV 86.5 85.1  MCH 28.8 28.2  MCHC 33.2 33.1  RDW 13.5 13.6  LYMPHSABS 2.7  --   MONOABS 0.6  --   EOSABS 0.1  --   BASOSABS 0.0  --     Chemistries  Recent Labs  Lab 06/28/20 0044 07/02/20 0655  NA 138 137  K 3.6 3.9  CL 106 106  CO2 25 24  GLUCOSE 139* 164*  BUN 11 14  CREATININE 1.10 0.93  CALCIUM 8.8* 9.2  MG 2.0  --   AST 27  --   ALT 34  --   ALKPHOS 50  --   BILITOT 0.7  --    ------------------------------------------------------------------------------------------------------------------ No results for input(s): CHOL, HDL, LDLCALC, TRIG, CHOLHDL, LDLDIRECT in the last 72 hours.  Lab Results  Component Value Date   HGBA1C 6.6 (H) 06/11/2020    ------------------------------------------------------------------------------------------------------------------ No results for input(s): TSH, T4TOTAL, T3FREE, THYROIDAB in the last 72 hours.  Invalid input(s): FREET3 ------------------------------------------------------------------------------------------------------------------ No results for input(s): VITAMINB12, FOLATE, FERRITIN, TIBC, IRON, RETICCTPCT in the last 72 hours.  Coagulation profile No results for input(s): INR, PROTIME in the last 168 hours.  No results for input(s): DDIMER in the last 72 hours.  Cardiac Enzymes No results for input(s): CKMB, TROPONINI, MYOGLOBIN in the last 168 hours.  Invalid input(s): CK ------------------------------------------------------------------------------------------------------------------    Component Value Date/Time   BNP 39.4 06/28/2020 0044    Micro Results No results found for this or any previous visit (from the past 240 hour(s)).  Radiology Reports CT Head Wo Contrast  Result Date: 06/10/2020 CLINICAL DATA:  Head and C-spine injury, fall, altered mental status EXAM: CT HEAD WITHOUT CONTRAST CT CERVICAL SPINE WITHOUT CONTRAST TECHNIQUE: Multidetector CT imaging of the head and cervical spine was performed following the standard protocol without intravenous contrast. Multiplanar CT image reconstructions of the cervical spine were also generated. COMPARISON:  05/04/2020 FINDINGS: CT HEAD FINDINGS Brain: Stable mild brain atrophy pattern. No acute intracranial hemorrhage, new mass lesion, acute infarction, midline shift, herniation, hydrocephalus, or extra-axial fluid collection. No focal mass effect or edema. Cisterns are patent. Cerebellar atrophy as well. Vascular: Intracranial atherosclerosis at the skull base. No hyperdense vessel. Skull: Normal. Negative for fracture or focal lesion. Sinuses/Orbits: Minor scattered sinus mucosal thickening as before. Orbits are symmetric. No  acute finding. Other: None. CT CERVICAL SPINE FINDINGS Alignment: Normal. Skull base and vertebrae: No acute fracture. No primary bone lesion or focal pathologic process. Soft tissues and spinal canal: Normal prevertebral soft tissues. No hemorrhage or hematoma appreciated. Carotid atherosclerosis. Disc levels: Similar mild multilevel degenerative disc disease and facet arthropathy. Facets are aligned. No subluxation or dislocation. Preserved vertebral body heights. Upper chest: Negative. Other: None. IMPRESSION: Stable brain atrophy pattern. No acute intracranial abnormality or interval change by noncontrast CT. Stable mild cervical degenerative changes as above. No acute osseous finding or malalignment by CT. Electronically Signed   By: Jerilynn Mages.  Shick M.D.  On: 06/10/2020 12:31   CT Cervical Spine Wo Contrast  Result Date: 06/10/2020 CLINICAL DATA:  Head and C-spine injury, fall, altered mental status EXAM: CT HEAD WITHOUT CONTRAST CT CERVICAL SPINE WITHOUT CONTRAST TECHNIQUE: Multidetector CT imaging of the head and cervical spine was performed following the standard protocol without intravenous contrast. Multiplanar CT image reconstructions of the cervical spine were also generated. COMPARISON:  05/04/2020 FINDINGS: CT HEAD FINDINGS Brain: Stable mild brain atrophy pattern. No acute intracranial hemorrhage, new mass lesion, acute infarction, midline shift, herniation, hydrocephalus, or extra-axial fluid collection. No focal mass effect or edema. Cisterns are patent. Cerebellar atrophy as well. Vascular: Intracranial atherosclerosis at the skull base. No hyperdense vessel. Skull: Normal. Negative for fracture or focal lesion. Sinuses/Orbits: Minor scattered sinus mucosal thickening as before. Orbits are symmetric. No acute finding. Other: None. CT CERVICAL SPINE FINDINGS Alignment: Normal. Skull base and vertebrae: No acute fracture. No primary bone lesion or focal pathologic process. Soft tissues and spinal  canal: Normal prevertebral soft tissues. No hemorrhage or hematoma appreciated. Carotid atherosclerosis. Disc levels: Similar mild multilevel degenerative disc disease and facet arthropathy. Facets are aligned. No subluxation or dislocation. Preserved vertebral body heights. Upper chest: Negative. Other: None. IMPRESSION: Stable brain atrophy pattern. No acute intracranial abnormality or interval change by noncontrast CT. Stable mild cervical degenerative changes as above. No acute osseous finding or malalignment by CT. Electronically Signed   By: Jerilynn Mages.  Shick M.D.   On: 06/10/2020 12:31   MR BRAIN WO CONTRAST  Result Date: 06/29/2020 CLINICAL DATA:  Right-sided facial drooping, altered mental status. EXAM: MRI HEAD WITHOUT CONTRAST TECHNIQUE: Multiplanar, multiecho pulse sequences of the brain and surrounding structures were obtained without intravenous contrast. COMPARISON:  June 10, 2020. FINDINGS: Brain: No acute infarction, hemorrhage, hydrocephalus, extra-axial collection or mass lesion. Dilated perivascular space in inferior right basal ganglia. No substantial white matter disease for patient age. Vascular: Partial loss of flow void in the distal left vertebral artery, which could be due to stenosis. Normal flow void in the visualized proximal aICAs. Skull and upper cervical spine: Normal marrow signal. Sinuses/Orbits: Mild mucosal thickening of the frontal sinuses and moderate mucosal thickening of scattered ethmoid air cells. Inferior left maxillary sinus mucous retention cyst. Bilateral exopthalmos, similar to prior. Other: No mastoid effusions. IMPRESSION: 1. No evidence of acute abnormality. 2. Partial loss of flow void in the distal left vertebral artery, similar to prior and possibly due to stenosis. Consider CT angio head and neck to further evaluate. 3. Frontoethmoidal paranasal sinus mucosal thickening. Electronically Signed   By: Margaretha Sheffield MD   On: 06/29/2020 07:47   MR Brain Wo Contrast  (neuro protocol)  Result Date: 06/10/2020 CLINICAL DATA:  Acute neuro deficit.  Mental status change. EXAM: MRI HEAD WITHOUT CONTRAST TECHNIQUE: Multiplanar, multiecho pulse sequences of the brain and surrounding structures were obtained without intravenous contrast. COMPARISON:  CT head 06/10/2020 FINDINGS: Brain: Ventricle size and cerebral volume normal for age. Negative for acute infarct. No significant chronic ischemic change. Negative for hemorrhage or mass. Image quality degraded by mild motion. Vascular: Partial loss of flow void in the left internal carotid artery through the skull base and the distal left vertebral artery which may be due to slow flow or stenosis. Skull and upper cervical spine: Negative Sinuses/Orbits: Mild mucosal edema paranasal sinuses. No orbital mass. Bilateral exophthalmos. Other: None IMPRESSION: Negative for acute infarct. Partial loss of flow void in the left internal carotid artery distal left vertebral artery which could be due  to proximal stenosis. Consider CT angio head and neck for further evaluation. Electronically Signed   By: Franchot Gallo M.D.   On: 06/10/2020 15:09   DG Chest Port 1 View  Result Date: 06/25/2020 CLINICAL DATA:  Shortness of breath and lethargy. EXAM: PORTABLE CHEST 1 VIEW COMPARISON:  Chest x-ray 06/24/2020 FINDINGS: The cardiac silhouette, mediastinal and hilar contours are within limits. Stable mild tortuosity and calcification the thoracic aorta. The lungs are clear of an acute process. No pleural effusions or pulmonary lesions. The bony thorax is intact. IMPRESSION: No acute cardiopulmonary findings. Electronically Signed   By: Marijo Sanes M.D.   On: 06/25/2020 14:15   DG Chest Port 1 View  Result Date: 06/24/2020 CLINICAL DATA:  Shortness of breath. EXAM: PORTABLE CHEST 1 VIEW COMPARISON:  June 20, 2020. FINDINGS: The heart size and mediastinal contours are within normal limits. Both lungs are clear. The visualized skeletal structures  are unremarkable. IMPRESSION: No active disease. Electronically Signed   By: Marijo Conception M.D.   On: 06/24/2020 08:13   DG Chest Port 1 View  Result Date: 06/20/2020 CLINICAL DATA:  Shortness of breath EXAM: PORTABLE CHEST 1 VIEW COMPARISON:  06/11/2020 FINDINGS: Loop recorder is identified adjacent to the cardiac apex. Normal heart size. No pleural effusion or edema. No airspace densities identified. No acute osseous findings. IMPRESSION: No acute cardiopulmonary abnormalities. Electronically Signed   By: Kerby Moors M.D.   On: 06/20/2020 08:34   DG Chest Port 1 View  Result Date: 06/11/2020 CLINICAL DATA:  Questionable sepsis. EXAM: PORTABLE CHEST 1 VIEW COMPARISON:  June 10, 2020 FINDINGS: There is no evidence of acute infiltrate, pleural effusion or pneumothorax. The heart size and mediastinal contours are within normal limits. A radiopaque loop recorder device is seen. The visualized skeletal structures are unremarkable. IMPRESSION: No active disease. Electronically Signed   By: Virgina Norfolk M.D.   On: 06/11/2020 03:10   DG Chest Port 1 View  Result Date: 06/10/2020 CLINICAL DATA:  Altered mental status. EXAM: PORTABLE CHEST 1 VIEW COMPARISON:  May 25, 2020 FINDINGS: Loop recorder in place. Cardiomediastinal silhouette is normal. Mediastinal contours appear intact. Tortuosity and calcific atherosclerotic disease of the aorta. There is no evidence of focal airspace consolidation, pleural effusion or pneumothorax. Osseous structures are without acute abnormality. Soft tissues are grossly normal. IMPRESSION: No active disease. Electronically Signed   By: Fidela Salisbury M.D.   On: 06/10/2020 11:15   DG Swallowing Func-Speech Pathology  Result Date: 07/02/2020 Objective Swallowing Evaluation: Type of Study: Bedside Swallow Evaluation  Patient Details Name: Latanya Laventure MRN: [id number] Date of Birth: 05-07-1940 Today's Date: 07/02/2020 Time: SLP Start Time (ACUTE ONLY): 3 -SLP Stop  Time (ACUTE ONLY): 1249 SLP Time Calculation (min) (ACUTE ONLY): 19 min Past Medical History: Past Medical History: Diagnosis Date . Cancer ([place])   skin cancer . Dementia ([place])  . Diabetes mellitus without complication ([place])  . Hypertension  . Memory deficit  Past Surgical History: Past Surgical History: Procedure Laterality Date . BACK SURGERY   . HERNIA REPAIR   . LOOP RECORDER INSERTION N/A 07/26/2017  Procedure: LOOP RECORDER INSERTION;  Surgeon: Evans Lance, MD;  Location: [place] [facility];  Service: Cardiovascular;  Laterality: N/A; . TEE WITHOUT CARDIOVERSION N/A 07/26/2017  Procedure: TRANSESOPHAGEAL ECHOCARDIOGRAM (TEE);  Surgeon: Fay Records, MD;  Location: [facility] ENDOSCOPY;  Service: Cardiovascular;  Laterality: N/A; . TRIGGER FINGER RELEASE Right 12/02/2015  Procedure: RIGHT THUMB RELEASE TRIGGER FINGER/A-1 PULLEY;  Surgeon: Leanora Cover,  MD;  Location: [facility];  Service: Orthopedics;  Laterality: Right; HPI: Pt is a 80 y.o. male with PMHx of vascular dementia, CVA, DM-2, HTN. On 4/14, pt took his dog for a walk and the dog returned home without him. Pt was found lying on the side of road with no memory of what had happened but able to follow commands. Pt was taken to [facility] and d/c home with daughter following negative workup, but pt was brough to [facility] ED due to increased confusion. Pt found to be febrile and COVID+ and admitted for acute metabolic encephalopathy. MRI brain 4/14 negative for acute changes. CXR 4/15 negative for active disease. EEG 4/16: mild to moderate diffuse encephalopathy, nonspecific etiology. No seizures or epileptiform discharges. Psych consulted due to severe delirium.  No data recorded Assessment / Plan / Recommendation CHL IP CLINICAL IMPRESSIONS 07/02/2020 Clinical Impression Pt presented with oropharyngeal dysphagia characterized by impaired bolus control and a pharyngeal delay. He demonstrated premature spillage to the pyriform sinuses, penetration (PAS 5) and  aspiration (PAS 7, 8) of thin liquids via cup and straw. Pt was unable to demonstrate compensatory strategies despite verbal prompts and cues and aspiration persisted despite use of 5cc and 10cc boluses. Laryngeal invasion was improved to PAS 2 with nectar thick liquids which is considered WNL. Aspiration resulted in an ineffective cough on two occasions, but all other instances were silent. Per EMR, pt has required augmentative IV fluids every few days due to poor p.o. intake, and SLP anticipates that use of nectar thick liquids to eliminate aspiration would likely not improve this. It is anticipated that the pt has had intermittent aspiration of thin liquids throughout this 21-day admission. Pt's daughter, [name], was contacted via phone and she reported that the pt had also been inconsistently symptomic of aspiration prior to admission so his aspiration may be chronic issue. Pt is ambulatory, afebrile, and all five of his CXRs have been negative for active disease. Pt's case was discussed with Dr. Waldron Labs including potential options for dysphagia management considering dehydration risk vs pulmonary risk. Considering his tolerance of intermittent aspiration thus far, it was agreed that his current diet will be continued with strict observance of swallowing precautions, consistent pt mobilization, and consistent oral care. Pt's daughter, [name], was educated regarding the pt's performance, concerns regarding potential for dehydration due to avoidance of liquids, and the potential progression of dysphagia in the setting of dementia. All of her questions were answered and she verbalized agreement with plan of care. SLP Visit Diagnosis Dysphagia, oropharyngeal phase (R13.12) Attention and concentration deficit following -- Frontal lobe and executive function deficit following -- Impact on safety and function Mild aspiration risk;Moderate aspiration risk   CHL IP TREATMENT RECOMMENDATION 07/02/2020 Treatment  Recommendations Therapy as outlined in treatment plan below   Prognosis 07/02/2020 Prognosis for Safe Diet Advancement Fair Barriers to Reach Goals Cognitive deficits Barriers/Prognosis Comment -- [facility] IP DIET RECOMMENDATION 07/02/2020 SLP Diet Recommendations Dysphagia 3 (Mech soft) solids;Thin liquid Liquid Administration via Cup;No straw Medication Administration Whole meds with puree Compensations Minimize environmental distractions;Slow rate;Small sips/bites Postural Changes Seated upright at 90 degrees   CHL IP OTHER RECOMMENDATIONS 07/02/2020 Recommended Consults -- Oral Care Recommendations Oral care BID;Staff/trained caregiver to provide oral care Other Recommendations --   CH[facility] IP FOLLOW UP RECOMMENDATIONS 07/02/2020 Follow up Recommendations Skilled Nursing facility   [facility] IP FREQUENCY AND DURATION 07/02/2020 Speech Therapy Frequency (ACUTE ONLY) min 1 x/week Treatment Duration 2 weeks      CHL  IP ORAL PHASE 07/02/2020 Oral Phase Impaired Oral - Pudding Teaspoon -- Oral - Pudding Cup -- Oral - Honey Teaspoon -- Oral - Honey Cup -- Oral - Nectar Teaspoon -- Oral - Nectar Cup -- Oral - Nectar Straw Premature spillage;Decreased bolus cohesion Oral - Thin Teaspoon -- Oral - Thin Cup Premature spillage;Decreased bolus cohesion Oral - Thin Straw Premature spillage;Decreased bolus cohesion Oral - Puree Decreased bolus cohesion;Premature spillage Oral - Mech Soft -- Oral - Regular WFL Oral - Multi-Consistency -- Oral - Pill Decreased bolus cohesion;Premature spillage Oral Phase - Comment --  CHL IP PHARYNGEAL PHASE 07/02/2020 Pharyngeal Phase Impaired Pharyngeal- Pudding Teaspoon -- Pharyngeal -- Pharyngeal- Pudding Cup -- Pharyngeal -- Pharyngeal- Honey Teaspoon -- Pharyngeal -- Pharyngeal- Honey Cup -- Pharyngeal -- Pharyngeal- Nectar Teaspoon -- Pharyngeal -- Pharyngeal- Nectar Cup Delayed swallow initiation-vallecula Pharyngeal -- Pharyngeal- Nectar Straw Delayed swallow initiation-vallecula;Delayed swallow  initiation-pyriform sinuses;Penetration/Aspiration during swallow Pharyngeal Material enters airway, remains ABOVE vocal cords then ejected out Pharyngeal- Thin Teaspoon -- Pharyngeal -- Pharyngeal- Thin Cup Delayed swallow initiation-pyriform sinuses;Penetration/Aspiration during swallow;Penetration/Apiration after swallow Pharyngeal Material enters airway, CONTACTS cords and not ejected out;Material enters airway, passes BELOW cords and not ejected out despite cough attempt by patient;Material enters airway, passes BELOW cords without attempt by patient to eject out (silent aspiration) Pharyngeal- Thin Straw Delayed swallow initiation-pyriform sinuses;Penetration/Aspiration during swallow;Penetration/Apiration after swallow Pharyngeal Material enters airway, CONTACTS cords and not ejected out;Material enters airway, passes BELOW cords without attempt by patient to eject out (silent aspiration);Material enters airway, passes BELOW cords and not ejected out despite cough attempt by patient Pharyngeal- Puree Delayed swallow initiation-vallecula Pharyngeal -- Pharyngeal- Mechanical Soft -- Pharyngeal -- Pharyngeal- Regular Delayed swallow initiation-vallecula Pharyngeal -- Pharyngeal- Multi-consistency -- Pharyngeal -- Pharyngeal- Pill Delayed swallow initiation-vallecula Pharyngeal -- Pharyngeal Comment --  CHL IP CERVICAL ESOPHAGEAL PHASE 07/02/2020 Cervical Esophageal Phase WFL Pudding Teaspoon -- Pudding Cup -- Honey Teaspoon -- Honey Cup -- Nectar Teaspoon -- Nectar Cup -- Nectar Straw -- Thin Teaspoon -- Thin Cup -- Thin Straw -- Puree -- Mechanical Soft -- Regular -- Multi-consistency -- Pill -- Cervical Esophageal Comment -- Shanika I. Hardin Negus, [place], [place] Office number [phone] Pager [phone] Horton Marshall 07/02/2020, 3:23 PM              EEG adult  Result Date: 06/29/2020 Lora Havens, MD     06/29/2020  1:23 PM Patient Name: Tyion Ghobrial MRN: [id number] Epilepsy  Attending: Lora Havens Referring Physician/Provider: Dr Amie [place] Date: 06/29/2020 Duration: 21.43 mins Patient history: 80 year old male with waxing and waning mental status.  EEG to evaluate for seizures. Level of alertness: Awake AEDs during EEG study: Depakote Technical aspects: This EEG study was done with scalp electrodes positioned according to the 10-20 International system of electrode placement. Electrical activity was acquired at a sampling rate of 500Hz  and reviewed with a high frequency filter of 70Hz  and a low frequency filter of 1Hz . EEG data were recorded continuously and digitally stored. Description: The posterior dominant rhythm consists of 8-9 Hz activity of moderate voltage (25-35 uV) seen predominantly in posterior head regions, symmetric and reactive to eye opening and eye closing. EEG showed intermittent generalized 5 to 6 Hz theta slowing. Hyperventilation and photic stimulation were not performed.   ABNORMALITY - Intermittent slow, generalized IMPRESSION: This study is suggestive of mild diffuse encephalopathy, nonspecific etiology. No seizures or epileptiform discharges were seen throughout the recording. Lora Havens   EEG adult  Result Date: 06/12/2020  Lora Havens, MD     06/12/2020  3:07 PM Patient Name: Mordechai Weise MRN: [id number] Epilepsy Attending: Lora Havens Referring Physician/Provider: Dr Oren Binet Date: 06/12/2020 Duration: 25.42 mins Patient history: 80yo M with ams. EEG to evaluate for seizure Level of alertness: Awake AEDs during EEG study: None Technical aspects: This EEG study was done with scalp electrodes positioned according to the 10-20 International system of electrode placement. Electrical activity was acquired at a sampling rate of 500Hz  and reviewed with a high frequency filter of 70Hz  and a low frequency filter of 1Hz . EEG data were recorded continuously and digitally stored. Description: No posterior dominant rhythm was seen. EEG showed  continuous generalized 5 to 6 Hz theta slowing. Hyperventilation and photic stimulation were not performed.   ABNORMALITY - Continuous slow, generalized IMPRESSION: This study is suggestive of mild to moderate diffuse encephalopathy, nonspecific etiology. No seizures or epileptiform discharges were seen throughout the recording. Priyanka Barbra Sarks   DG Hips Bilat W or Wo Pelvis 3-4 Views  Result Date: 06/10/2020 CLINICAL DATA:  Found on side of road, last seen normal at 2130 hours last night, fall, dementia, tenderness of hips bilaterally EXAM: DG HIP (WITH OR WITHOUT PELVIS) 3-4V BILAT COMPARISON:  None FINDINGS: Osseous demineralization. Hip and SI joint spaces preserved. No acute fracture, dislocation, or bone destruction. Mild degenerative disc disease changes at visualized lower lumbar spine. IMPRESSION: No acute osseous abnormalities. Electronically Signed   By: Lavonia Dana M.D.   On: 06/10/2020 11:48   CT HEAD CODE STROKE WO CONTRAST`  Result Date: 06/28/2020 CLINICAL DATA:  Code stroke.  Confusion.  Altered mental status. EXAM: CT HEAD WITHOUT CONTRAST TECHNIQUE: Contiguous axial images were obtained from the base of the skull through the vertex without intravenous contrast. COMPARISON:  MRI 06/10/2020 FINDINGS: Brain: Age related volume loss. No evidence of old or acute small or large vessel infarction. No mass lesion, hemorrhage, hydrocephalus or extra-axial collection. Dilated perivascular space at the base of the brain. Vascular: There is atherosclerotic calcification of the major vessels at the base of the brain. Skull: Negative Sinuses/Orbits: Clear/normal Other: None ASPECTS ([place] Stroke Program Early CT Score) - Ganglionic level infarction (caudate, lentiform nuclei, internal capsule, insula, M1-M3 cortex): 7 - Supraganglionic infarction (M4-M6 cortex): 3 Total score (0-10 with 10 being normal): 10 IMPRESSION: 1. Normal head CT for age. Atherosclerotic calcification of the major vessels at  base of the brain. 2. ASPECTS is 10 3. These results were communicated to Dr. Rory Percy at 7:18 pmon 5/2/2022by text page via the [facility] messaging system. Electronically Signed   By: Nelson Chimes M.D.   On: 06/28/2020 19:19

## 2020-07-04 NOTE — Plan of Care (Signed)
  Problem: Safety: Goal: Non-violent Restraint(s) Outcome: Not Progressing Note: Patient actively in restraints   Problem: Education: Goal: Knowledge of General Education information will improve Description: Including pain rating scale, medication(s)/side effects and non-pharmacologic comfort measures Outcome: Not Progressing   Problem: Health Behavior/Discharge Planning: Goal: Ability to manage health-related needs will improve Outcome: Not Progressing   Problem: Clinical Measurements: Goal: Ability to maintain clinical measurements within normal limits will improve Outcome: Progressing Goal: Will remain free from infection Outcome: Progressing Goal: Diagnostic test results will improve Outcome: Progressing Goal: Respiratory complications will improve Outcome: Progressing Goal: Cardiovascular complication will be avoided Outcome: Progressing   Problem: Activity: Goal: Risk for activity intolerance will decrease Outcome: Progressing   Problem: Nutrition: Goal: Adequate nutrition will be maintained Outcome: Progressing   Problem: Coping: Goal: Level of anxiety will decrease Outcome: Not Progressing   Problem: Elimination: Goal: Will not experience complications related to bowel motility Outcome: Progressing Goal: Will not experience complications related to urinary retention Outcome: Not Progressing Note: In and out catheter used d/t urinary retention   Problem: Pain Managment: Goal: General experience of comfort will improve Outcome: Progressing   Problem: Safety: Goal: Ability to remain free from injury will improve Outcome: Progressing   Problem: Skin Integrity: Goal: Risk for impaired skin integrity will decrease Outcome: Progressing   Problem: Education: Goal: Knowledge of risk factors and measures for prevention of condition will improve Outcome: Adequate for Discharge   Problem: Coping: Goal: Psychosocial and spiritual needs will be  supported Outcome: Adequate for Discharge   Problem: Respiratory: Goal: Will maintain a patent airway Outcome: Adequate for Discharge Goal: Complications related to the disease process, condition or treatment will be avoided or minimized Outcome: Adequate for Discharge   Problem: Clinical Measurements: Goal: Ability to maintain clinical measurements within normal limits will improve Outcome: Progressing Goal: Will remain free from infection Outcome: Progressing Goal: Diagnostic test results will improve Outcome: Progressing Goal: Respiratory complications will improve Outcome: Progressing Goal: Cardiovascular complication will be avoided Outcome: Progressing   Problem: Activity: Goal: Risk for activity intolerance will decrease Outcome: Progressing   Problem: Nutrition: Goal: Adequate nutrition will be maintained Outcome: Progressing   Problem: Elimination: Goal: Will not experience complications related to bowel motility Outcome: Progressing   Problem: Pain Managment: Goal: General experience of comfort will improve Outcome: Progressing   Problem: Safety: Goal: Ability to remain free from injury will improve Outcome: Progressing   Problem: Skin Integrity: Goal: Risk for impaired skin integrity will decrease Outcome: Progressing

## 2020-07-05 DIAGNOSIS — G9341 Metabolic encephalopathy: Secondary | ICD-10-CM | POA: Diagnosis not present

## 2020-07-05 DIAGNOSIS — R410 Disorientation, unspecified: Secondary | ICD-10-CM | POA: Diagnosis not present

## 2020-07-05 LAB — GLUCOSE, CAPILLARY
Glucose-Capillary: 119 mg/dL — ABNORMAL HIGH (ref 70–99)
Glucose-Capillary: 215 mg/dL — ABNORMAL HIGH (ref 70–99)
Glucose-Capillary: 171 mg/dL — ABNORMAL HIGH (ref 70–99)
Glucose-Capillary: 268 mg/dL — ABNORMAL HIGH (ref 70–99)

## 2020-07-05 NOTE — TOC Progression Note (Signed)
Transition of Care [facility]) - Progression Note    Patient Details  Name: Patrick Phillips MRN: [id number] Date of Birth: 08-05-1940  [facility]) CM/SW [place], LCSW Phone Number: 07/05/2020, 9:38 AM  Clinical Narrative:    CSW confirmed patient is still on the [facility] waitlist. CSW also inquired with [facility] to see if patient is still under review for memory care.    Expected Discharge Plan: Memory Care Barriers to Discharge: Continued Medical Work up  Expected Discharge Plan and Services Expected Discharge Plan: Memory Care In-house Referral: Clinical Social Work Discharge Planning Services: CM Consult Post Acute Care Choice: Nursing Home Living arrangements for the past 2 months: Single Family Home Expected Discharge Date: 06/29/20                                     Social Determinants of Health (SDOH) Interventions    Readmission Risk Interventions No flowsheet data found.

## 2020-07-05 NOTE — Progress Notes (Signed)
PROGRESS NOTE                                                                                                                                                                                                             Patient Demographics:    Patrick Phillips, is a 80 y.o. male, DOB - [birth year], EGB:[id number]  Outpatient Primary MD for the patient is [name], MD   Admit date - 06/10/2020   LOS - 24  Chief Complaint  Patient presents with  . Altered Mental Status       Brief Narrative: Patient is a 80 y.o. male with PMHx of vascular dementia, prior CVA, DM-2, HTN-who presented with fever and confusion.  COVID-19 vaccinated status:  Unknown  Significant Events:  4/14>> Admit to [facility] for fever/confusion  Significant studies:  4/14>>Chest x-ray: No PNA 4/14>> CT head: No acute intracranial abnormality. 4/14>> CT C-spine: No fracture 4/14>> MRI brain: No acute infarct. 4/15>> chest x-ray: No PNA 4/15>> CSF: WBC 1 (tube #4), protein 46, glucose 54 4/16>> EEG: Mild to moderate diffuse encephalopathy-no seizures.  COVID-19 medications:  Remdesivir: 4/15>>  Antibiotics:  Vancomycin: 4/14 x1 Ceftriaxone: 4/14 x 1 Ampicillin: 4/14 x 1  Microbiology data:  4/14 >>blood culture: No growth 4/14>> urine culture: No growth 4/15>> blood culture: No growth 4/15>> urine culture: Multiple species 4/15>> CSF culture: No growth 4/15>> CSF PCR HSV 1/2: Negative. 4/15>> CSF HSV culture: Negative 4/15>> CSF arbovirus IgG: Negative 4/15>> CSF VDRL: Negative RMSF CSF serology IgM -ve  Procedures:  4/15>> lumbar puncture in the emergency room  Consults:  Neurology, psychiatry, Pall.Care  DVT prophylaxis:  enoxaparin (LOVENOX) injection 40 mg Start: 06/11/20 0600    Subjective:   Patient with a good night, no restlessness or agitation overnight, he had complaints of dyspnea overnight, he currently denies  that, his chest x-ray and EKG were nonacute.    Assessment  & Plan :   Acute metabolic encephalopathy-superimposed on underlying Dementia with delirium:  - Encephalopathy on presentation was likely related to febrile illness/COVID-19 infection-all culture data negative so far.  CT head, MRI brain and EEG unremarkable. He has had no  fever since admission-his encephalopathy I suspect his confusion/agitation now is related to delirium associated with dementia. Neuro and Psych consulted. -Hospital delirium remains significant problem, but his medication has been adjusted frequently, but overall he seems to be doing better on Seroquel 50 mg nightly, Depakote 125 mg twice daily. - Evaluated by psychiatry and was initially started on Saphris and Depakote-  Saphris was stopped on 06/23/20 due to somnolence and was replaced by Seroquel, have reduced Depakote dose due to sedation along with Seroquel on 06/25/2020 with less somnolence, his Depakote currently increased to 250 mg p.o. twice daily, will continue on current dose Seroquel 50 mg at bedtime.. -EEG with no evidence of seizure, just significant for encephalopathic. - Note mental status is waxing and waning now some bett.   ? Tick Bite: 2 ticks found by nursing staff on patient 06/17/20 - RMSF IgM - ve, DW ID Dr Tommy Medal 06/23/20 - not acute, no Rx.  AKI: Mild-likely hemodynamically mediated-resolved with IV fluids.  Dysphagia -Patient followed closely by SLP, had MBS done 5/8, he has been aspirating on thin liquids, he is currently asymptomatic, with no signs of pneumonia, I have discussed with family, at this point we will keep him on current diet will monitor closely for signs of infection, as he is high risk for dehydration if fluid consistency changed .  Borderline vitamin B12 deficiency: On supplementation.  Hypokalemia: Repleted-recheck periodically.  HTN: Currently on Coreg, Norvasc, added hydralazine for better control.  History of CVA-MRI  brain negative x 2  - non acute-on aspirin/statin. Seen by Neuro, ? Code stroke 06/28/20 PM, doubt a true event, MRI negative again, stable exam, defer any more workup to Neuro.  Dementia: On Namenda  Moderate protein calorie malnutrition - seen by nutritionist.  GERD: On PPI  SIRS-COVID-19 infection: Completed Remdesivir x3 days.  Not hypoxic-no pneumonia-does not require any further therapy.  Interestingly-patient apparently had COVID-19 infection in February as well.  Has finished his COVID isolation on 06/21/2020  No results for input(s): DDIMER, FERRITIN, LDH, CRP in the last 72 hours.  Lab Results  Component Value Date   [place] (A) 06/11/2020   [place] NEGATIVE 01/13/2020   DM-2: CBGs relatively stable-continue SSI-oral hypoglycemic agents on hold  Recent Labs    07/04/20 2049 07/05/20 0746 07/05/20 1236  GLUCAP 141* 119* 215*     Condition -   Guarded  Family Communication  : Discussed with daughter at bedside 5/7  Code Status :  DNR  Diet :  Diet Order            Diet regular Room service appropriate? No; Fluid consistency: Thin  Diet effective now                  Disposition Plan  :   Status is: Inpatient  Remains inpatient appropriate because:Inpatient level of care appropriate due to severity of illness   Dispo: The patient is from: Home              Anticipated d/c is to: SNF              Patient currently is medically stable to d/c. awaiting SNF bed availability   Difficult to place patient No   Barriers to discharge: Resolving encephalopathy-awaiting final culture data.  Antimicorbials  :    Anti-infectives (From admission, onward)   Start     Dose/Rate Route Frequency Ordered Stop   06/18/20 1745  doxycycline (VIBRAMYCIN) 100 mg in sodium chloride 0.9 %  250 mL IVPB  Status:  Discontinued        100 mg 125 mL/hr over 120 Minutes Intravenous Every 12 hours 06/18/20 1648 06/23/20 0847   06/12/20 1000  remdesivir 100 mg in  sodium chloride 0.9 % 100 mL IVPB        100 mg 200 mL/hr over 30 Minutes Intravenous Daily 06/11/20 0435 06/13/20 0900   06/11/20 0445  remdesivir 200 mg in sodium chloride 0.9% 250 mL IVPB        200 mg 580 mL/hr over 30 Minutes Intravenous Once 06/11/20 0434 06/11/20 0929   06/11/20 0230  vancomycin (VANCOREADY) IVPB 1500 mg/300 mL        1,500 mg 150 mL/hr over 120 Minutes Intravenous  Once 06/11/20 0229 06/11/20 0812   06/11/20 0215  ampicillin (OMNIPEN) 2 g in sodium chloride 0.9 % 100 mL IVPB        2 g 300 mL/hr over 20 Minutes Intravenous  Once 06/11/20 0157 06/11/20 0550   06/11/20 0215  vancomycin (VANCOREADY) IVPB 1000 mg/200 mL  Status:  Discontinued        1,000 mg 200 mL/hr over 60 Minutes Intravenous  Once 06/11/20 0157 06/11/20 0229   06/11/20 0200  cefTRIAXone (ROCEPHIN) 2 g in sodium chloride 0.9 % 100 mL IVPB        2 g 200 mL/hr over 30 Minutes Intravenous  Once 06/11/20 0157 06/11/20 0248      Inpatient Medications  Scheduled Meds: . amLODipine  10 mg Oral Daily  . aspirin EC  81 mg Oral Daily  . atorvastatin  40 mg Oral Daily  . carvedilol  6.25 mg Oral BID WC  . divalproex  250 mg Oral Q12H  . enoxaparin (LOVENOX) injection  40 mg Subcutaneous Q24H  . feeding supplement  237 mL Oral BID BM  . fluticasone  2 spray Each Nare Daily  . hydrALAZINE  50 mg Oral Q8H  . insulin aspart  0-9 Units Subcutaneous TID WC  . LORazepam  0.5 mg Intravenous Once  . multivitamin with minerals  1 tablet Oral Daily  . naLOXone (NARCAN)  injection  0.4 mg Intravenous Once  . pantoprazole  40 mg Oral Daily  . QUEtiapine  50 mg Oral Q2000  . senna  2 tablet Oral Daily  . tamsulosin  0.4 mg Oral Daily  . thiamine  100 mg Oral Daily  . vitamin B-12  1,000 mcg Oral Daily   Continuous Infusions:  PRN Meds:.acetaminophen, haloperidol lactate, hydrALAZINE, [DISCONTINUED] ondansetron **OR** ondansetron (ZOFRAN) IV, sodium chloride   See all Orders from today for further  details   Phillips Climes M.D on 07/05/2020 at 3:35 PM  To page go to [email or web address] - use universal password  [facility] -  Office  [phone]    Objective:   Vitals:   07/04/20 1649 07/05/20 0651 07/05/20 0932 07/05/20 1437  BP: 127/64 (!) 133/57 (!) 113/53 133/74  Pulse: 83 68 60 74  Resp: 18 20 18 18   Temp: 97.8 F (36.6 C) 98.4 F (36.9 C) 98.2 F (36.8 C) (!) 97.3 F (36.3 C)  TempSrc: Oral Oral Oral Oral  SpO2: 98% 100% 98% 100%  Weight:      Height:        Wt Readings from Last 3 Encounters:  06/10/20 86.2 kg  06/10/20 86.2 kg  04/19/20 86.2 kg     Intake/Output Summary (Last 24 hours) at 07/05/2020 1535 Last data filed at 07/05/2020 0900 Gross per 24  hour  Intake 120 ml  Output --  Net 120 ml     Physical Exam  Awake , frail, confused, pleasant, communicative Symmetrical Chest wall movement, Good air movement bilaterally, CTAB RRR,No Gallops,Rubs or new Murmurs, No Parasternal Heave +ve B.Sounds, Abd Soft, No tenderness, No rebound - guarding or rigidity. No Cyanosis, Clubbing or edema, No new Rash or bruise      Data Review:    CBC Recent Labs  Lab 07/02/20 0655  WBC 10.8*  HGB 12.5*  HCT 37.8*  PLT 254  MCV 85.1  MCH 28.2  MCHC 33.1  RDW 13.6    Chemistries  Recent Labs  Lab 07/02/20 0655  NA 137  K 3.9  CL 106  CO2 24  GLUCOSE 164*  BUN 14  CREATININE 0.93  CALCIUM 9.2   ------------------------------------------------------------------------------------------------------------------ No results for input(s): CHOL, HDL, LDLCALC, TRIG, CHOLHDL, LDLDIRECT in the last 72 hours.  Lab Results  Component Value Date   HGBA1C 6.6 (H) 06/11/2020   ------------------------------------------------------------------------------------------------------------------ No results for input(s): TSH, T4TOTAL, T3FREE, THYROIDAB in the last 72 hours.  Invalid input(s):  FREET3 ------------------------------------------------------------------------------------------------------------------ No results for input(s): VITAMINB12, FOLATE, FERRITIN, TIBC, IRON, RETICCTPCT in the last 72 hours.  Coagulation profile No results for input(s): INR, PROTIME in the last 168 hours.  No results for input(s): DDIMER in the last 72 hours.  Cardiac Enzymes No results for input(s): CKMB, TROPONINI, MYOGLOBIN in the last 168 hours.  Invalid input(s): CK ------------------------------------------------------------------------------------------------------------------    Component Value Date/Time   BNP 39.4 06/28/2020 0044    Micro Results No results found for this or any previous visit (from the past 240 hour(s)).  Radiology Reports CT Head Wo Contrast  Result Date: 06/10/2020 CLINICAL DATA:  Head and C-spine injury, fall, altered mental status EXAM: CT HEAD WITHOUT CONTRAST CT CERVICAL SPINE WITHOUT CONTRAST TECHNIQUE: Multidetector CT imaging of the head and cervical spine was performed following the standard protocol without intravenous contrast. Multiplanar CT image reconstructions of the cervical spine were also generated. COMPARISON:  05/04/2020 FINDINGS: CT HEAD FINDINGS Brain: Stable mild brain atrophy pattern. No acute intracranial hemorrhage, new mass lesion, acute infarction, midline shift, herniation, hydrocephalus, or extra-axial fluid collection. No focal mass effect or edema. Cisterns are patent. Cerebellar atrophy as well. Vascular: Intracranial atherosclerosis at the skull base. No hyperdense vessel. Skull: Normal. Negative for fracture or focal lesion. Sinuses/Orbits: Minor scattered sinus mucosal thickening as before. Orbits are symmetric. No acute finding. Other: None. CT CERVICAL SPINE FINDINGS Alignment: Normal. Skull base and vertebrae: No acute fracture. No primary bone lesion or focal pathologic process. Soft tissues and spinal canal: Normal  prevertebral soft tissues. No hemorrhage or hematoma appreciated. Carotid atherosclerosis. Disc levels: Similar mild multilevel degenerative disc disease and facet arthropathy. Facets are aligned. No subluxation or dislocation. Preserved vertebral body heights. Upper chest: Negative. Other: None. IMPRESSION: Stable brain atrophy pattern. No acute intracranial abnormality or interval change by noncontrast CT. Stable mild cervical degenerative changes as above. No acute osseous finding or malalignment by CT. Electronically Signed   By: Jerilynn Mages.  Shick M.D.   On: 06/10/2020 12:31   CT Cervical Spine Wo Contrast  Result Date: 06/10/2020 CLINICAL DATA:  Head and C-spine injury, fall, altered mental status EXAM: CT HEAD WITHOUT CONTRAST CT CERVICAL SPINE WITHOUT CONTRAST TECHNIQUE: Multidetector CT imaging of the head and cervical spine was performed following the standard protocol without intravenous contrast. Multiplanar CT image reconstructions of the cervical spine were also generated. COMPARISON:  05/04/2020 FINDINGS: CT HEAD  FINDINGS Brain: Stable mild brain atrophy pattern. No acute intracranial hemorrhage, new mass lesion, acute infarction, midline shift, herniation, hydrocephalus, or extra-axial fluid collection. No focal mass effect or edema. Cisterns are patent. Cerebellar atrophy as well. Vascular: Intracranial atherosclerosis at the skull base. No hyperdense vessel. Skull: Normal. Negative for fracture or focal lesion. Sinuses/Orbits: Minor scattered sinus mucosal thickening as before. Orbits are symmetric. No acute finding. Other: None. CT CERVICAL SPINE FINDINGS Alignment: Normal. Skull base and vertebrae: No acute fracture. No primary bone lesion or focal pathologic process. Soft tissues and spinal canal: Normal prevertebral soft tissues. No hemorrhage or hematoma appreciated. Carotid atherosclerosis. Disc levels: Similar mild multilevel degenerative disc disease and facet arthropathy. Facets are aligned. No  subluxation or dislocation. Preserved vertebral body heights. Upper chest: Negative. Other: None. IMPRESSION: Stable brain atrophy pattern. No acute intracranial abnormality or interval change by noncontrast CT. Stable mild cervical degenerative changes as above. No acute osseous finding or malalignment by CT. Electronically Signed   By: Jerilynn Mages.  Shick M.D.   On: 06/10/2020 12:31   MR BRAIN WO CONTRAST  Result Date: 06/29/2020 CLINICAL DATA:  Right-sided facial drooping, altered mental status. EXAM: MRI HEAD WITHOUT CONTRAST TECHNIQUE: Multiplanar, multiecho pulse sequences of the brain and surrounding structures were obtained without intravenous contrast. COMPARISON:  June 10, 2020. FINDINGS: Brain: No acute infarction, hemorrhage, hydrocephalus, extra-axial collection or mass lesion. Dilated perivascular space in inferior right basal ganglia. No substantial white matter disease for patient age. Vascular: Partial loss of flow void in the distal left vertebral artery, which could be due to stenosis. Normal flow void in the visualized proximal aICAs. Skull and upper cervical spine: Normal marrow signal. Sinuses/Orbits: Mild mucosal thickening of the frontal sinuses and moderate mucosal thickening of scattered ethmoid air cells. Inferior left maxillary sinus mucous retention cyst. Bilateral exopthalmos, similar to prior. Other: No mastoid effusions. IMPRESSION: 1. No evidence of acute abnormality. 2. Partial loss of flow void in the distal left vertebral artery, similar to prior and possibly due to stenosis. Consider CT angio head and neck to further evaluate. 3. Frontoethmoidal paranasal sinus mucosal thickening. Electronically Signed   By: Margaretha Sheffield MD   On: 06/29/2020 07:47   MR Brain Wo Contrast (neuro protocol)  Result Date: 06/10/2020 CLINICAL DATA:  Acute neuro deficit.  Mental status change. EXAM: MRI HEAD WITHOUT CONTRAST TECHNIQUE: Multiplanar, multiecho pulse sequences of the brain and  surrounding structures were obtained without intravenous contrast. COMPARISON:  CT head 06/10/2020 FINDINGS: Brain: Ventricle size and cerebral volume normal for age. Negative for acute infarct. No significant chronic ischemic change. Negative for hemorrhage or mass. Image quality degraded by mild motion. Vascular: Partial loss of flow void in the left internal carotid artery through the skull base and the distal left vertebral artery which may be due to slow flow or stenosis. Skull and upper cervical spine: Negative Sinuses/Orbits: Mild mucosal edema paranasal sinuses. No orbital mass. Bilateral exophthalmos. Other: None IMPRESSION: Negative for acute infarct. Partial loss of flow void in the left internal carotid artery distal left vertebral artery which could be due to proximal stenosis. Consider CT angio head and neck for further evaluation. Electronically Signed   By: Franchot Gallo M.D.   On: 06/10/2020 15:09   DG CHEST PORT 1 VIEW  Result Date: 07/04/2020 CLINICAL DATA:  Dyspnea EXAM: PORTABLE CHEST 1 VIEW COMPARISON:  06/25/2020 FINDINGS: Cardiac shadow is within normal limits. The lungs are clear bilaterally. Loop recorder is noted on the left. No bony abnormality  is seen. IMPRESSION: No acute abnormality noted. Electronically Signed   By: Inez Catalina M.D.   On: 07/04/2020 21:41   DG Chest Port 1 View  Result Date: 06/25/2020 CLINICAL DATA:  Shortness of breath and lethargy. EXAM: PORTABLE CHEST 1 VIEW COMPARISON:  Chest x-ray 06/24/2020 FINDINGS: The cardiac silhouette, mediastinal and hilar contours are within limits. Stable mild tortuosity and calcification the thoracic aorta. The lungs are clear of an acute process. No pleural effusions or pulmonary lesions. The bony thorax is intact. IMPRESSION: No acute cardiopulmonary findings. Electronically Signed   By: Marijo Sanes M.D.   On: 06/25/2020 14:15   DG Chest Port 1 View  Result Date: 06/24/2020 CLINICAL DATA:  Shortness of breath. EXAM:  PORTABLE CHEST 1 VIEW COMPARISON:  June 20, 2020. FINDINGS: The heart size and mediastinal contours are within normal limits. Both lungs are clear. The visualized skeletal structures are unremarkable. IMPRESSION: No active disease. Electronically Signed   By: Marijo Conception M.D.   On: 06/24/2020 08:13   DG Chest Port 1 View  Result Date: 06/20/2020 CLINICAL DATA:  Shortness of breath EXAM: PORTABLE CHEST 1 VIEW COMPARISON:  06/11/2020 FINDINGS: Loop recorder is identified adjacent to the cardiac apex. Normal heart size. No pleural effusion or edema. No airspace densities identified. No acute osseous findings. IMPRESSION: No acute cardiopulmonary abnormalities. Electronically Signed   By: Kerby Moors M.D.   On: 06/20/2020 08:34   DG Chest Port 1 View  Result Date: 06/11/2020 CLINICAL DATA:  Questionable sepsis. EXAM: PORTABLE CHEST 1 VIEW COMPARISON:  June 10, 2020 FINDINGS: There is no evidence of acute infiltrate, pleural effusion or pneumothorax. The heart size and mediastinal contours are within normal limits. A radiopaque loop recorder device is seen. The visualized skeletal structures are unremarkable. IMPRESSION: No active disease. Electronically Signed   By: Virgina Norfolk M.D.   On: 06/11/2020 03:10   DG Chest Port 1 View  Result Date: 06/10/2020 CLINICAL DATA:  Altered mental status. EXAM: PORTABLE CHEST 1 VIEW COMPARISON:  May 25, 2020 FINDINGS: Loop recorder in place. Cardiomediastinal silhouette is normal. Mediastinal contours appear intact. Tortuosity and calcific atherosclerotic disease of the aorta. There is no evidence of focal airspace consolidation, pleural effusion or pneumothorax. Osseous structures are without acute abnormality. Soft tissues are grossly normal. IMPRESSION: No active disease. Electronically Signed   By: Fidela Salisbury M.D.   On: 06/10/2020 11:15   DG Swallowing Func-Speech Pathology  Result Date: 07/02/2020 Objective Swallowing Evaluation: Type of  Study: Bedside Swallow Evaluation  Patient Details Name: Morad Manalang MRN: [id number] Date of Birth: 08-13-1940 Today's Date: 07/02/2020 Time: SLP Start Time (ACUTE ONLY): 94 -SLP Stop Time (ACUTE ONLY): 1249 SLP Time Calculation (min) (ACUTE ONLY): 19 min Past Medical History: Past Medical History: Diagnosis Date . Cancer ([place])   skin cancer . Dementia ([place])  . Diabetes mellitus without complication ([place])  . Hypertension  . Memory deficit  Past Surgical History: Past Surgical History: Procedure Laterality Date . BACK SURGERY   . HERNIA REPAIR   . LOOP RECORDER INSERTION N/A 07/26/2017  Procedure: LOOP RECORDER INSERTION;  Surgeon: Evans Lance, MD;  Location: [facility];  Service: Cardiovascular;  Laterality: N/A; . TEE WITHOUT CARDIOVERSION N/A 07/26/2017  Procedure: TRANSESOPHAGEAL ECHOCARDIOGRAM (TEE);  Surgeon: Fay Records, MD;  Location: [facility];  Service: Cardiovascular;  Laterality: N/A; . TRIGGER FINGER RELEASE Right 12/02/2015  Procedure: RIGHT THUMB RELEASE TRIGGER FINGER/A-1 PULLEY;  Surgeon: Leanora Cover, MD;  Location: [facility];  Service: Orthopedics;  Laterality: Right; HPI: Pt is a 80 y.o. male with PMHx of vascular dementia, CVA, DM-2, HTN. On 4/14, pt took his dog for a walk and the dog returned home without him. Pt was found lying on the side of road with no memory of what had happened but able to follow commands. Pt was taken to [facility] and d/c home with daughter following negative workup, but pt was brough to [facility] ED due to increased confusion. Pt found to be febrile and COVID+ and admitted for acute metabolic encephalopathy. MRI brain 4/14 negative for acute changes. CXR 4/15 negative for active disease. EEG 4/16: mild to moderate diffuse encephalopathy, nonspecific etiology. No seizures or epileptiform discharges. Psych consulted due to severe delirium.  No data recorded Assessment / Plan / Recommendation CHL IP CLINICAL IMPRESSIONS 07/02/2020 Clinical Impression Pt  presented with oropharyngeal dysphagia characterized by impaired bolus control and a pharyngeal delay. He demonstrated premature spillage to the pyriform sinuses, penetration (PAS 5) and aspiration (PAS 7, 8) of thin liquids via cup and straw. Pt was unable to demonstrate compensatory strategies despite verbal prompts and cues and aspiration persisted despite use of 5cc and 10cc boluses. Laryngeal invasion was improved to PAS 2 with nectar thick liquids which is considered WNL. Aspiration resulted in an ineffective cough on two occasions, but all other instances were silent. Per EMR, pt has required augmentative IV fluids every few days due to poor p.o. intake, and SLP anticipates that use of nectar thick liquids to eliminate aspiration would likely not improve this. It is anticipated that the pt has had intermittent aspiration of thin liquids throughout this 21-day admission. Pt's daughter, [name], was contacted via phone and she reported that the pt had also been inconsistently symptomic of aspiration prior to admission so his aspiration may be chronic issue. Pt is ambulatory, afebrile, and all five of his CXRs have been negative for active disease. Pt's case was discussed with Dr. Waldron Labs including potential options for dysphagia management considering dehydration risk vs pulmonary risk. Considering his tolerance of intermittent aspiration thus far, it was agreed that his current diet will be continued with strict observance of swallowing precautions, consistent pt mobilization, and consistent oral care. Pt's daughter, [name], was educated regarding the pt's performance, concerns regarding potential for dehydration due to avoidance of liquids, and the potential progression of dysphagia in the setting of dementia. All of her questions were answered and she verbalized agreement with plan of care. SLP Visit Diagnosis Dysphagia, oropharyngeal phase (R13.12) Attention and concentration deficit following -- Frontal  lobe and executive function deficit following -- Impact on safety and function Mild aspiration risk;Moderate aspiration risk   CHL IP TREATMENT RECOMMENDATION 07/02/2020 Treatment Recommendations Therapy as outlined in treatment plan below   Prognosis 07/02/2020 Prognosis for Safe Diet Advancement Fair Barriers to Reach Goals Cognitive deficits Barriers/Prognosis Comment -- CHL IP DIET RECOMMENDATION 07/02/2020 SLP Diet Recommendations Dysphagia 3 (Mech soft) solids;Thin liquid Liquid Administration via Cup;No straw Medication Administration Whole meds with puree Compensations Minimize environmental distractions;Slow rate;Small sips/bites Postural Changes Seated upright at 90 degrees   CHL IP OTHER RECOMMENDATIONS 07/02/2020 Recommended Consults -- Oral Care Recommendations Oral care BID;Staff/trained caregiver to provide oral care Other Recommendations --   CHL IP FOLLOW UP RECOMMENDATIONS 07/02/2020 Follow up Recommendations Skilled Nursing facility   [facility] IP FREQUENCY AND DURATION 07/02/2020 Speech Therapy Frequency (ACUTE ONLY) min 1 x/week Treatment Duration 2 weeks      [facility] IP ORAL PHASE 07/02/2020 Oral  Phase Impaired Oral - Pudding Teaspoon -- Oral - Pudding Cup -- Oral - Honey Teaspoon -- Oral - Honey Cup -- Oral - Nectar Teaspoon -- Oral - Nectar Cup -- Oral - Nectar Straw Premature spillage;Decreased bolus cohesion Oral - Thin Teaspoon -- Oral - Thin Cup Premature spillage;Decreased bolus cohesion Oral - Thin Straw Premature spillage;Decreased bolus cohesion Oral - Puree Decreased bolus cohesion;Premature spillage Oral - Mech Soft -- Oral - Regular WFL Oral - Multi-Consistency -- Oral - Pill Decreased bolus cohesion;Premature spillage Oral Phase - Comment --  [facility] IP PHARYNGEAL PHASE 07/02/2020 Pharyngeal Phase Impaired Pharyngeal- Pudding Teaspoon -- Pharyngeal -- Pharyngeal- Pudding Cup -- Pharyngeal -- Pharyngeal- Honey Teaspoon -- Pharyngeal -- Pharyngeal- Honey Cup -- Pharyngeal -- Pharyngeal- Nectar Teaspoon --  Pharyngeal -- Pharyngeal- Nectar Cup Delayed swallow initiation-vallecula Pharyngeal -- Pharyngeal- Nectar Straw Delayed swallow initiation-vallecula;Delayed swallow initiation-pyriform sinuses;Penetration/Aspiration during swallow Pharyngeal Material enters airway, remains ABOVE vocal cords then ejected out Pharyngeal- Thin Teaspoon -- Pharyngeal -- Pharyngeal- Thin Cup Delayed swallow initiation-pyriform sinuses;Penetration/Aspiration during swallow;Penetration/Apiration after swallow Pharyngeal Material enters airway, CONTACTS cords and not ejected out;Material enters airway, passes BELOW cords and not ejected out despite cough attempt by patient;Material enters airway, passes BELOW cords without attempt by patient to eject out (silent aspiration) Pharyngeal- Thin Straw Delayed swallow initiation-pyriform sinuses;Penetration/Aspiration during swallow;Penetration/Apiration after swallow Pharyngeal Material enters airway, CONTACTS cords and not ejected out;Material enters airway, passes BELOW cords without attempt by patient to eject out (silent aspiration);Material enters airway, passes BELOW cords and not ejected out despite cough attempt by patient Pharyngeal- Puree Delayed swallow initiation-vallecula Pharyngeal -- Pharyngeal- Mechanical Soft -- Pharyngeal -- Pharyngeal- Regular Delayed swallow initiation-vallecula Pharyngeal -- Pharyngeal- Multi-consistency -- Pharyngeal -- Pharyngeal- Pill Delayed swallow initiation-vallecula Pharyngeal -- Pharyngeal Comment --  [facility] IP CERVICAL ESOPHAGEAL PHASE 07/02/2020 Cervical Esophageal Phase WFL Pudding Teaspoon -- Pudding Cup -- Honey Teaspoon -- Honey Cup -- Nectar Teaspoon -- Nectar Cup -- Nectar Straw -- Thin Teaspoon -- Thin Cup -- Thin Straw -- Puree -- Mechanical Soft -- Regular -- Multi-consistency -- Pill -- Cervical Esophageal Comment -- Shanika I. Hardin Negus, [place], [place] Office number [phone] Pager [phone] Horton Marshall 07/02/2020, 3:23 PM              EEG adult  Result Date: 06/29/2020 Lora Havens, MD     06/29/2020  1:23 PM Patient Name: Layton Podolski MRN: [id number] Epilepsy Attending: Lora Havens Referring Physician/Provider: Dr Amie Portland Date: 06/29/2020 Duration: 21.43 mins Patient history: 80 year old male with waxing and waning mental status.  EEG to evaluate for seizures. Level of alertness: Awake AEDs during EEG study: Depakote Technical aspects: This EEG study was done with scalp electrodes positioned according to the 10-20 International system of electrode placement. Electrical activity was acquired at a sampling rate of 500Hz  and reviewed with a high frequency filter of 70Hz  and a low frequency filter of 1Hz . EEG data were recorded continuously and digitally stored. Description: The posterior dominant rhythm consists of 8-9 Hz activity of moderate voltage (25-35 uV) seen predominantly in posterior head regions, symmetric and reactive to eye opening and eye closing. EEG showed intermittent generalized 5 to 6 Hz theta slowing. Hyperventilation and photic stimulation were not performed.   ABNORMALITY - Intermittent slow, generalized IMPRESSION: This study is suggestive of mild diffuse encephalopathy, nonspecific etiology. No seizures or epileptiform discharges were seen throughout the recording. Lora Havens   EEG adult  Result Date: 06/12/2020 Lora Havens, MD  06/12/2020  3:07 PM Patient Name: Roch Druin MRN: [id number] Epilepsy Attending: Lora Havens Referring Physician/Provider: Dr Oren Binet Date: 06/12/2020 Duration: 25.42 mins Patient history: 80yo M with ams. EEG to evaluate for seizure Level of alertness: Awake AEDs during EEG study: None Technical aspects: This EEG study was done with scalp electrodes positioned according to the 10-20 International system of electrode placement. Electrical activity was acquired at a sampling rate of 500Hz  and reviewed with a high  frequency filter of 70Hz  and a low frequency filter of 1Hz . EEG data were recorded continuously and digitally stored. Description: No posterior dominant rhythm was seen. EEG showed continuous generalized 5 to 6 Hz theta slowing. Hyperventilation and photic stimulation were not performed.   ABNORMALITY - Continuous slow, generalized IMPRESSION: This study is suggestive of mild to moderate diffuse encephalopathy, nonspecific etiology. No seizures or epileptiform discharges were seen throughout the recording. Priyanka Barbra Sarks   DG Hips Bilat W or Wo Pelvis 3-4 Views  Result Date: 06/10/2020 CLINICAL DATA:  Found on side of road, last seen normal at 2130 hours last night, fall, dementia, tenderness of hips bilaterally EXAM: DG HIP (WITH OR WITHOUT PELVIS) 3-4V BILAT COMPARISON:  None FINDINGS: Osseous demineralization. Hip and SI joint spaces preserved. No acute fracture, dislocation, or bone destruction. Mild degenerative disc disease changes at visualized lower lumbar spine. IMPRESSION: No acute osseous abnormalities. Electronically Signed   By: Lavonia Dana M.D.   On: 06/10/2020 11:48   CT HEAD CODE STROKE WO CONTRAST`  Result Date: 06/28/2020 CLINICAL DATA:  Code stroke.  Confusion.  Altered mental status. EXAM: CT HEAD WITHOUT CONTRAST TECHNIQUE: Contiguous axial images were obtained from the base of the skull through the vertex without intravenous contrast. COMPARISON:  MRI 06/10/2020 FINDINGS: Brain: Age related volume loss. No evidence of old or acute small or large vessel infarction. No mass lesion, hemorrhage, hydrocephalus or extra-axial collection. Dilated perivascular space at the base of the brain. Vascular: There is atherosclerotic calcification of the major vessels at the base of the brain. Skull: Negative Sinuses/Orbits: Clear/normal Other: None ASPECTS ([place] Stroke Program Early CT Score) - Ganglionic level infarction (caudate, lentiform nuclei, internal capsule, insula, M1-M3 cortex): 7 -  Supraganglionic infarction (M4-M6 cortex): 3 Total score (0-10 with 10 being normal): 10 IMPRESSION: 1. Normal head CT for age. Atherosclerotic calcification of the major vessels at base of the brain. 2. ASPECTS is 10 3. These results were communicated to Dr. Rory Percy at 7:18 pmon 5/2/2022by text page via the [facility] messaging system. Electronically Signed   By: Nelson Chimes M.D.   On: 06/28/2020 19:19

## 2020-07-05 NOTE — Progress Notes (Signed)
Physical Therapy Treatment Patient Details Name: Patrick Phillips MRN: [id number] DOB: 01/04/1941 Today's Date: 07/05/2020    History of Present Illness 80 y.o. male with presented with fever and confusion. Pt with dementia however still highly functional, took dog for a walk and dog came back without him. Pt later found lying on the side of road with no memory of what had happened but able to follow commands, taken to [facility] where MRI was negative for stroke and x-ray negative for fx. D/c home with daughter but pt with increasing confusion pt then brought  to [facility] ED 06/10/20. Pt with increasing confusion and agitation. Pt found to be febrile and COVID+. Admitted for acute metabolic encephalopathy.    PMHx of vascular dementia, prior CVA, DM-2, HTN    PT Comments    Patient received in chair with safety sitter present, agreeable to going for a walk. Cooperative but restless today, perseverating on not being able to breathe however per NT he had been walking multiple times already today on RA with no problems with oxygen desat. Tolerated gait training about 111f, seemed a bit fatigued this morning- but again, had been up walking with nursing multiple times. Left up in recliner with NT sitter present, all needs otherwise met.     Follow Up Recommendations  SNF;Other (comment) (might be able to go home if cognition clears to baseline)     Equipment Recommendations  None recommended by PT    Recommendations for Other Services       Precautions / Restrictions Precautions Precautions: Fall Precaution Comments: found down along side of road; h/o combativeness with dementia Restrictions Weight Bearing Restrictions: No    Mobility  Bed Mobility               General bed mobility comments: OOB in the chair on arrival    Transfers Overall transfer level: Needs assistance Equipment used: None Transfers: Sit to/from Stand Sit to Stand: Modified independent (Device/Increase time)          General transfer comment: Mod(I), no physical assist given  Ambulation/Gait Ambulation/Gait assistance: Min guard Gait Distance (Feet): 100 Feet Assistive device: None Gait Pattern/deviations: Step-through pattern;Decreased step length - right;Decreased step length - left;Shuffle;Trunk flexed Gait velocity: decreased   General Gait Details: flexed, forward posture and tends to shuffle; only tolerated one lap around nurses station due to fatigue, NT sitter reports they had been up and walking multiple times earlier today   Stairs             Wheelchair Mobility    Modified Rankin (Stroke Patients Only)       Balance Overall balance assessment: Needs assistance Sitting-balance support: No upper extremity supported;Feet supported Sitting balance-Leahy Scale: Good     Standing balance support: No upper extremity supported;During functional activity Standing balance-Leahy Scale: Fair Standing balance comment: min guard for safety/balance                            Cognition Arousal/Alertness: Awake/alert Behavior During Therapy: WFL for tasks assessed/performed;Restless Overall Cognitive Status: History of cognitive impairments - at baseline                                 General Comments: calm today but perseverating on not being able to breathe (however per nursing staff O2 sats have been fine), follwed cues well but very distractable  Exercises      General Comments        Pertinent Vitals/Pain Pain Assessment: Faces Faces Pain Scale: No hurt Pain Intervention(s): Limited activity within patient's tolerance;Monitored during session    Home Living                      Prior Function            PT Goals (current goals can now be found in the care plan section) Acute Rehab PT Goals Patient Stated Goal: I want to see my dog PT Goal Formulation: With patient Time For Goal Achievement: 07/12/20 Potential to  Achieve Goals: Good Progress towards PT goals: Progressing toward goals    Frequency    Min 3X/week      PT Plan Current plan remains appropriate    Co-evaluation              AM-PAC PT "6 Clicks" Mobility   Outcome Measure  Help needed turning from your back to your side while in a flat bed without using bedrails?: None Help needed moving from lying on your back to sitting on the side of a flat bed without using bedrails?: None Help needed moving to and from a bed to a chair (including a wheelchair)?: A Little Help needed standing up from a chair using your arms (e.g., wheelchair or bedside chair)?: A Little Help needed to walk in hospital room?: A Little Help needed climbing 3-5 steps with a railing? : A Little 6 Click Score: 20    End of Session Equipment Utilized During Treatment: Gait belt Activity Tolerance: Patient tolerated treatment well Patient left: in chair;with call bell/phone within reach;with nursing/sitter in room Nurse Communication: Mobility status PT Visit Diagnosis: Other abnormalities of gait and mobility (R26.89);Muscle weakness (generalized) (M62.81)     Time: 2426-8341 PT Time Calculation (min) (ACUTE ONLY): 9 min  Charges:  $Gait Training: 8-22 mins                     Windell Norfolk, DPT, PN1   Supplemental Physical Therapist [place]    Pager [phone] Acute Rehab Office [phone]

## 2020-07-05 NOTE — Plan of Care (Signed)
  Problem: Safety: Goal: Non-violent Restraint(s) Outcome: Progressing   Problem: Clinical Measurements: Goal: Ability to maintain clinical measurements within normal limits will improve Outcome: Progressing Goal: Will remain free from infection Outcome: Progressing Goal: Diagnostic test results will improve Outcome: Progressing Goal: Respiratory complications will improve Outcome: Progressing Goal: Cardiovascular complication will be avoided Outcome: Progressing   Problem: Activity: Goal: Risk for activity intolerance will decrease Outcome: Progressing   Problem: Nutrition: Goal: Adequate nutrition will be maintained Outcome: Progressing   Problem: Pain Managment: Goal: General experience of comfort will improve Outcome: Progressing   Problem: Safety: Goal: Ability to remain free from injury will improve Outcome: Progressing   Problem: Skin Integrity: Goal: Risk for impaired skin integrity will decrease Outcome: Progressing   Problem: Coping: Goal: Psychosocial and spiritual needs will be supported Outcome: Progressing   Problem: Respiratory: Goal: Will maintain a patent airway Outcome: Progressing Goal: Complications related to the disease process, condition or treatment will be avoided or minimized Outcome: Progressing

## 2020-07-06 DIAGNOSIS — G9341 Metabolic encephalopathy: Secondary | ICD-10-CM | POA: Diagnosis not present

## 2020-07-06 LAB — GLUCOSE, CAPILLARY
Glucose-Capillary: 143 mg/dL — ABNORMAL HIGH (ref 70–99)
Glucose-Capillary: 216 mg/dL — ABNORMAL HIGH (ref 70–99)
Glucose-Capillary: 207 mg/dL — ABNORMAL HIGH (ref 70–99)
Glucose-Capillary: 225 mg/dL — ABNORMAL HIGH (ref 70–99)

## 2020-07-06 NOTE — Progress Notes (Signed)
PROGRESS NOTE                                                                                                                                                                                                             Patient Demographics:    Patrick Phillips, is a 80 y.o. male, DOB - 1940/09/28, [id number]  Outpatient Primary MD for the patient is [name], MD   Admit date - 06/10/2020   LOS - 25  Chief Complaint  Patient presents with  . Altered Mental Status       Brief Narrative: Patient is a 80 y.o. male with PMHx of vascular dementia, prior CVA, DM-2, HTN-who presented with fever and confusion.  COVID-19 vaccinated status:  Unknown  Significant Events:  4/14>> Admit to [facility] for fever/confusion  Significant studies:  4/14>>Chest x-ray: No PNA 4/14>> CT head: No acute intracranial abnormality. 4/14>> CT C-spine: No fracture 4/14>> MRI brain: No acute infarct. 4/15>> chest x-ray: No PNA 4/15>> CSF: WBC 1 (tube #4), protein 46, glucose 54 4/16>> EEG: Mild to moderate diffuse encephalopathy-no seizures.  COVID-19 medications:  Remdesivir: 4/15>>  Antibiotics:  Vancomycin: 4/14 x1 Ceftriaxone: 4/14 x 1 Ampicillin: 4/14 x 1  Microbiology data:  4/14 >>blood culture: No growth 4/14>> urine culture: No growth 4/15>> blood culture: No growth 4/15>> urine culture: Multiple species 4/15>> CSF culture: No growth 4/15>> CSF PCR HSV 1/2: Negative. 4/15>> CSF HSV culture: Negative 4/15>> CSF arbovirus IgG: Negative 4/15>> CSF VDRL: Negative RMSF CSF serology IgM -ve  Procedures:  4/15>> lumbar puncture in the emergency room  Consults:  Neurology, psychiatry, Pall.Care  DVT prophylaxis:  enoxaparin (LOVENOX) injection 40 mg Start: 06/11/20 0600    Subjective:   Patient with significant events overnight, overall good appetite, himself denies any complaints today.     Assessment  & Plan  :   Acute metabolic encephalopathy-superimposed on underlying Dementia with delirium:  - Encephalopathy on presentation was likely related to febrile illness/COVID-19 infection-all culture data negative so far.  CT head, MRI brain and EEG unremarkable. He has had no fever since admission-his encephalopathy I suspect his confusion/agitation now is related to delirium  associated with dementia. Neuro and Psych consulted. -Hospital delirium remains significant problem, but his medication has been adjusted frequently, but overall he seems to be doing better on Seroquel 50 mg nightly, Depakote 125 mg twice daily. - Evaluated by psychiatry and was initially started on Saphris and Depakote-  Saphris was stopped on 06/23/20 due to somnolence and was replaced by Seroquel, have reduced Depakote dose due to sedation along with Seroquel on 06/25/2020 with less somnolence, his Depakote currently increased to 250 mg p.o. twice daily, will continue on current dose Seroquel 50 mg at bedtime.. -EEG with no evidence of seizure, just significant for encephalopathic. - Note mental status is waxing and waning now some bett.   ? Tick Bite: 2 ticks found by nursing staff on patient 06/17/20 - RMSF IgM - ve, DW ID Dr Tommy Medal 06/23/20 - not acute, no Rx.  AKI: Mild-likely hemodynamically mediated-resolved with IV fluids.  Dysphagia -Patient followed closely by SLP, had MBS done 5/8, he has been aspirating on thin liquids, he is currently asymptomatic, with no signs of pneumonia, I have discussed with family, at this point we will keep him on current diet will monitor closely for signs of infection, as he is high risk for dehydration if fluid consistency changed .  Borderline vitamin B12 deficiency: On supplementation.  Hypokalemia: Repleted-recheck periodically.  HTN: Currently on Coreg, Norvasc, added hydralazine for better control.  History of CVA-MRI brain negative x 2  - non acute-on aspirin/statin. Seen by Neuro, ?  Code stroke 06/28/20 PM, doubt a true event, MRI negative again, stable exam,   Dementia: On Namenda  Moderate protein calorie malnutrition - seen by nutritionist.  GERD: On PPI  SIRS-COVID-19 infection: Completed Remdesivir x3 days.  Not hypoxic-no pneumonia-does not require any further therapy.  Interestingly-patient apparently had COVID-19 infection in February as well.  Has finished his COVID isolation on 06/21/2020  No results for input(s): DDIMER, FERRITIN, LDH, CRP in the last 72 hours.  Lab Results  Component Value Date   [place] (A) 06/11/2020   [place] NEGATIVE 01/13/2020   DM-2: CBGs relatively stable-continue SSI-oral hypoglycemic agents on hold  Recent Labs    07/06/20 0733 07/06/20 1152 07/06/20 1601  GLUCAP 143* 216* 207*     Condition -   Guarded  Family Communication  : Discussed with daughter at bedside 5/7  Code Status :  DNR  Diet :  Diet Order            Diet regular Room service appropriate? No; Fluid consistency: Thin  Diet effective now                  Disposition Plan  :   Status is: Inpatient  Remains inpatient appropriate because:Inpatient level of care appropriate due to severity of illness   Dispo: The patient is from: Home              Anticipated d/c is to: SNF              Patient currently is medically stable to d/c. awaiting SNF bed availability   Difficult to place patient No   Barriers to discharge: Resolving encephalopathy-awaiting final culture data.  Antimicorbials  :    Anti-infectives (From admission, onward)   Start     Dose/Rate Route Frequency Ordered Stop   06/18/20 1745  doxycycline (VIBRAMYCIN) 100 mg in sodium chloride 0.9 % 250 mL IVPB  Status:  Discontinued        100 mg 125  mL/hr over 120 Minutes Intravenous Every 12 hours 06/18/20 1648 06/23/20 0847   06/12/20 1000  remdesivir 100 mg in sodium chloride 0.9 % 100 mL IVPB        100 mg 200 mL/hr over 30 Minutes Intravenous Daily  06/11/20 0435 06/13/20 0900   06/11/20 0445  remdesivir 200 mg in sodium chloride 0.9% 250 mL IVPB        200 mg 580 mL/hr over 30 Minutes Intravenous Once 06/11/20 0434 06/11/20 0929   06/11/20 0230  vancomycin (VANCOREADY) IVPB 1500 mg/300 mL        1,500 mg 150 mL/hr over 120 Minutes Intravenous  Once 06/11/20 0229 06/11/20 0812   06/11/20 0215  ampicillin (OMNIPEN) 2 g in sodium chloride 0.9 % 100 mL IVPB        2 g 300 mL/hr over 20 Minutes Intravenous  Once 06/11/20 0157 06/11/20 0550   06/11/20 0215  vancomycin (VANCOREADY) IVPB 1000 mg/200 mL  Status:  Discontinued        1,000 mg 200 mL/hr over 60 Minutes Intravenous  Once 06/11/20 0157 06/11/20 0229   06/11/20 0200  cefTRIAXone (ROCEPHIN) 2 g in sodium chloride 0.9 % 100 mL IVPB        2 g 200 mL/hr over 30 Minutes Intravenous  Once 06/11/20 0157 06/11/20 0248      Inpatient Medications  Scheduled Meds: . amLODipine  10 mg Oral Daily  . aspirin EC  81 mg Oral Daily  . atorvastatin  40 mg Oral Daily  . carvedilol  6.25 mg Oral BID WC  . divalproex  250 mg Oral Q12H  . enoxaparin (LOVENOX) injection  40 mg Subcutaneous Q24H  . feeding supplement  237 mL Oral BID BM  . fluticasone  2 spray Each Nare Daily  . hydrALAZINE  50 mg Oral Q8H  . insulin aspart  0-9 Units Subcutaneous TID WC  . LORazepam  0.5 mg Intravenous Once  . multivitamin with minerals  1 tablet Oral Daily  . naLOXone (NARCAN)  injection  0.4 mg Intravenous Once  . pantoprazole  40 mg Oral Daily  . QUEtiapine  50 mg Oral Q2000  . senna  2 tablet Oral Daily  . tamsulosin  0.4 mg Oral Daily  . thiamine  100 mg Oral Daily  . vitamin B-12  1,000 mcg Oral Daily   Continuous Infusions:  PRN Meds:.acetaminophen, haloperidol lactate, hydrALAZINE, [DISCONTINUED] ondansetron **OR** ondansetron (ZOFRAN) IV, sodium chloride   See all Orders from today for further details   Phillips Climes M.D on 07/06/2020 at 4:03 PM  To page go to [email or web address] - use  universal password  [facility] -  Office  [phone]    Objective:   Vitals:   07/05/20 1819 07/05/20 2046 07/06/20 0629 07/06/20 0821  BP: (!) 159/74 (!) 150/62 (!) 165/64 (!) 155/57  Pulse: 90 85 82 73  Resp:  17 19 16   Temp:  97.6 F (36.4 C) 98 F (36.7 C) 97.6 F (36.4 C)  TempSrc:  Axillary Axillary Oral  SpO2:  92% 99% 98%  Weight:      Height:        Wt Readings from Last 3 Encounters:  06/10/20 86.2 kg  06/10/20 86.2 kg  04/19/20 86.2 kg     Intake/Output Summary (Last 24 hours) at 07/06/2020 1603 Last data filed at 07/06/2020 1228 Gross per 24 hour  Intake 720 ml  Output 2 ml  Net 718 ml     Physical Exam  Awake Alert, frail, confused, pleasant Symmetrical Chest wall movement, Good air movement bilaterally, CTAB RRR,No Gallops,Rubs or new Murmurs, No Parasternal Heave +ve B.Sounds, Abd Soft, No tenderness, No rebound - guarding or rigidity. No Cyanosis, Clubbing or edema, No new Rash or bruise       Data Review:    CBC Recent Labs  Lab 07/02/20 0655  WBC 10.8*  HGB 12.5*  HCT 37.8*  PLT 254  MCV 85.1  MCH 28.2  MCHC 33.1  RDW 13.6    Chemistries  Recent Labs  Lab 07/02/20 0655  NA 137  K 3.9  CL 106  CO2 24  GLUCOSE 164*  BUN 14  CREATININE 0.93  CALCIUM 9.2   ------------------------------------------------------------------------------------------------------------------ No results for input(s): CHOL, HDL, LDLCALC, TRIG, CHOLHDL, LDLDIRECT in the last 72 hours.  Lab Results  Component Value Date   HGBA1C 6.6 (H) 06/11/2020   ------------------------------------------------------------------------------------------------------------------ No results for input(s): TSH, T4TOTAL, T3FREE, THYROIDAB in the last 72 hours.  Invalid input(s): FREET3 ------------------------------------------------------------------------------------------------------------------ No results for input(s): VITAMINB12, FOLATE, FERRITIN,  TIBC, IRON, RETICCTPCT in the last 72 hours.  Coagulation profile No results for input(s): INR, PROTIME in the last 168 hours.  No results for input(s): DDIMER in the last 72 hours.  Cardiac Enzymes No results for input(s): CKMB, TROPONINI, MYOGLOBIN in the last 168 hours.  Invalid input(s): CK ------------------------------------------------------------------------------------------------------------------    Component Value Date/Time   BNP 39.4 06/28/2020 0044    Micro Results No results found for this or any previous visit (from the past 240 hour(s)).  Radiology Reports CT Head Wo Contrast  Result Date: 06/10/2020 CLINICAL DATA:  Head and C-spine injury, fall, altered mental status EXAM: CT HEAD WITHOUT CONTRAST CT CERVICAL SPINE WITHOUT CONTRAST TECHNIQUE: Multidetector CT imaging of the head and cervical spine was performed following the standard protocol without intravenous contrast. Multiplanar CT image reconstructions of the cervical spine were also generated. COMPARISON:  05/04/2020 FINDINGS: CT HEAD FINDINGS Brain: Stable mild brain atrophy pattern. No acute intracranial hemorrhage, new mass lesion, acute infarction, midline shift, herniation, hydrocephalus, or extra-axial fluid collection. No focal mass effect or edema. Cisterns are patent. Cerebellar atrophy as well. Vascular: Intracranial atherosclerosis at the skull base. No hyperdense vessel. Skull: Normal. Negative for fracture or focal lesion. Sinuses/Orbits: Minor scattered sinus mucosal thickening as before. Orbits are symmetric. No acute finding. Other: None. CT CERVICAL SPINE FINDINGS Alignment: Normal. Skull base and vertebrae: No acute fracture. No primary bone lesion or focal pathologic process. Soft tissues and spinal canal: Normal prevertebral soft tissues. No hemorrhage or hematoma appreciated. Carotid atherosclerosis. Disc levels: Similar mild multilevel degenerative disc disease and facet arthropathy. Facets are  aligned. No subluxation or dislocation. Preserved vertebral body heights. Upper chest: Negative. Other: None. IMPRESSION: Stable brain atrophy pattern. No acute intracranial abnormality or interval change by noncontrast CT. Stable mild cervical degenerative changes as above. No acute osseous finding or malalignment by CT. Electronically Signed   By: Jerilynn Mages.  Shick M.D.   On: 06/10/2020 12:31   CT Cervical Spine Wo Contrast  Result Date: 06/10/2020 CLINICAL DATA:  Head and C-spine injury, fall, altered mental status EXAM: CT HEAD WITHOUT CONTRAST CT CERVICAL SPINE WITHOUT CONTRAST TECHNIQUE: Multidetector CT imaging of the head and cervical spine was performed following the standard protocol without intravenous contrast. Multiplanar CT image reconstructions of the cervical spine were also generated. COMPARISON:  05/04/2020 FINDINGS: CT HEAD FINDINGS Brain: Stable mild brain atrophy pattern. No acute intracranial hemorrhage, new mass lesion, acute infarction, midline shift, herniation,  hydrocephalus, or extra-axial fluid collection. No focal mass effect or edema. Cisterns are patent. Cerebellar atrophy as well. Vascular: Intracranial atherosclerosis at the skull base. No hyperdense vessel. Skull: Normal. Negative for fracture or focal lesion. Sinuses/Orbits: Minor scattered sinus mucosal thickening as before. Orbits are symmetric. No acute finding. Other: None. CT CERVICAL SPINE FINDINGS Alignment: Normal. Skull base and vertebrae: No acute fracture. No primary bone lesion or focal pathologic process. Soft tissues and spinal canal: Normal prevertebral soft tissues. No hemorrhage or hematoma appreciated. Carotid atherosclerosis. Disc levels: Similar mild multilevel degenerative disc disease and facet arthropathy. Facets are aligned. No subluxation or dislocation. Preserved vertebral body heights. Upper chest: Negative. Other: None. IMPRESSION: Stable brain atrophy pattern. No acute intracranial abnormality or interval  change by noncontrast CT. Stable mild cervical degenerative changes as above. No acute osseous finding or malalignment by CT. Electronically Signed   By: Jerilynn Mages.  Shick M.D.   On: 06/10/2020 12:31   MR BRAIN WO CONTRAST  Result Date: 06/29/2020 CLINICAL DATA:  Right-sided facial drooping, altered mental status. EXAM: MRI HEAD WITHOUT CONTRAST TECHNIQUE: Multiplanar, multiecho pulse sequences of the brain and surrounding structures were obtained without intravenous contrast. COMPARISON:  June 10, 2020. FINDINGS: Brain: No acute infarction, hemorrhage, hydrocephalus, extra-axial collection or mass lesion. Dilated perivascular space in inferior right basal ganglia. No substantial white matter disease for patient age. Vascular: Partial loss of flow void in the distal left vertebral artery, which could be due to stenosis. Normal flow void in the visualized proximal aICAs. Skull and upper cervical spine: Normal marrow signal. Sinuses/Orbits: Mild mucosal thickening of the frontal sinuses and moderate mucosal thickening of scattered ethmoid air cells. Inferior left maxillary sinus mucous retention cyst. Bilateral exopthalmos, similar to prior. Other: No mastoid effusions. IMPRESSION: 1. No evidence of acute abnormality. 2. Partial loss of flow void in the distal left vertebral artery, similar to prior and possibly due to stenosis. Consider CT angio head and neck to further evaluate. 3. Frontoethmoidal paranasal sinus mucosal thickening. Electronically Signed   By: Margaretha Sheffield MD   On: 06/29/2020 07:47   MR Brain Wo Contrast (neuro protocol)  Result Date: 06/10/2020 CLINICAL DATA:  Acute neuro deficit.  Mental status change. EXAM: MRI HEAD WITHOUT CONTRAST TECHNIQUE: Multiplanar, multiecho pulse sequences of the brain and surrounding structures were obtained without intravenous contrast. COMPARISON:  CT head 06/10/2020 FINDINGS: Brain: Ventricle size and cerebral volume normal for age. Negative for acute infarct.  No significant chronic ischemic change. Negative for hemorrhage or mass. Image quality degraded by mild motion. Vascular: Partial loss of flow void in the left internal carotid artery through the skull base and the distal left vertebral artery which may be due to slow flow or stenosis. Skull and upper cervical spine: Negative Sinuses/Orbits: Mild mucosal edema paranasal sinuses. No orbital mass. Bilateral exophthalmos. Other: None IMPRESSION: Negative for acute infarct. Partial loss of flow void in the left internal carotid artery distal left vertebral artery which could be due to proximal stenosis. Consider CT angio head and neck for further evaluation. Electronically Signed   By: Franchot Gallo M.D.   On: 06/10/2020 15:09   DG CHEST PORT 1 VIEW  Result Date: 07/04/2020 CLINICAL DATA:  Dyspnea EXAM: PORTABLE CHEST 1 VIEW COMPARISON:  06/25/2020 FINDINGS: Cardiac shadow is within normal limits. The lungs are clear bilaterally. Loop recorder is noted on the left. No bony abnormality is seen. IMPRESSION: No acute abnormality noted. Electronically Signed   By: Inez Catalina M.D.   On:  07/04/2020 21:41   DG Chest Port 1 View  Result Date: 06/25/2020 CLINICAL DATA:  Shortness of breath and lethargy. EXAM: PORTABLE CHEST 1 VIEW COMPARISON:  Chest x-ray 06/24/2020 FINDINGS: The cardiac silhouette, mediastinal and hilar contours are within limits. Stable mild tortuosity and calcification the thoracic aorta. The lungs are clear of an acute process. No pleural effusions or pulmonary lesions. The bony thorax is intact. IMPRESSION: No acute cardiopulmonary findings. Electronically Signed   By: Marijo Sanes M.D.   On: 06/25/2020 14:15   DG Chest Port 1 View  Result Date: 06/24/2020 CLINICAL DATA:  Shortness of breath. EXAM: PORTABLE CHEST 1 VIEW COMPARISON:  June 20, 2020. FINDINGS: The heart size and mediastinal contours are within normal limits. Both lungs are clear. The visualized skeletal structures are  unremarkable. IMPRESSION: No active disease. Electronically Signed   By: Marijo Conception M.D.   On: 06/24/2020 08:13   DG Chest Port 1 View  Result Date: 06/20/2020 CLINICAL DATA:  Shortness of breath EXAM: PORTABLE CHEST 1 VIEW COMPARISON:  06/11/2020 FINDINGS: Loop recorder is identified adjacent to the cardiac apex. Normal heart size. No pleural effusion or edema. No airspace densities identified. No acute osseous findings. IMPRESSION: No acute cardiopulmonary abnormalities. Electronically Signed   By: Kerby Moors M.D.   On: 06/20/2020 08:34   DG Chest Port 1 View  Result Date: 06/11/2020 CLINICAL DATA:  Questionable sepsis. EXAM: PORTABLE CHEST 1 VIEW COMPARISON:  June 10, 2020 FINDINGS: There is no evidence of acute infiltrate, pleural effusion or pneumothorax. The heart size and mediastinal contours are within normal limits. A radiopaque loop recorder device is seen. The visualized skeletal structures are unremarkable. IMPRESSION: No active disease. Electronically Signed   By: Virgina Norfolk M.D.   On: 06/11/2020 03:10   DG Chest Port 1 View  Result Date: 06/10/2020 CLINICAL DATA:  Altered mental status. EXAM: PORTABLE CHEST 1 VIEW COMPARISON:  May 25, 2020 FINDINGS: Loop recorder in place. Cardiomediastinal silhouette is normal. Mediastinal contours appear intact. Tortuosity and calcific atherosclerotic disease of the aorta. There is no evidence of focal airspace consolidation, pleural effusion or pneumothorax. Osseous structures are without acute abnormality. Soft tissues are grossly normal. IMPRESSION: No active disease. Electronically Signed   By: Fidela Salisbury M.D.   On: 06/10/2020 11:15   DG Swallowing Func-Speech Pathology  Result Date: 07/02/2020 Objective Swallowing Evaluation: Type of Study: Bedside Swallow Evaluation  Patient Details Name: Joshus Rogan MRN: [id number] Date of Birth: 23-Apr-1940 Today's Date: 07/02/2020 Time: SLP Start Time (ACUTE ONLY): 77 -SLP Stop Time  (ACUTE ONLY): 1249 SLP Time Calculation (min) (ACUTE ONLY): 19 min Past Medical History: Past Medical History: Diagnosis Date . Cancer ([place])   skin cancer . Dementia ([place])  . Diabetes mellitus without complication ([place])  . Hypertension  . Memory deficit  Past Surgical History: Past Surgical History: Procedure Laterality Date . BACK SURGERY   . HERNIA REPAIR   . LOOP RECORDER INSERTION N/A 07/26/2017  Procedure: LOOP RECORDER INSERTION;  Surgeon: Evans Lance, MD;  Location: [place] [facility];  Service: Cardiovascular;  Laterality: N/A; . TEE WITHOUT CARDIOVERSION N/A 07/26/2017  Procedure: TRANSESOPHAGEAL ECHOCARDIOGRAM (TEE);  Surgeon: Fay Records, MD;  Location: [facility] ENDOSCOPY;  Service: Cardiovascular;  Laterality: N/A; . TRIGGER FINGER RELEASE Right 12/02/2015  Procedure: RIGHT THUMB RELEASE TRIGGER FINGER/A-1 PULLEY;  Surgeon: Leanora Cover, MD;  Location: [facility];  Service: Orthopedics;  Laterality: Right; HPI: Pt is a 80 y.o. male with PMHx of vascular  dementia, CVA, DM-2, HTN. On 4/14, pt took his dog for a walk and the dog returned home without him. Pt was found lying on the side of road with no memory of what had happened but able to follow commands. Pt was taken to [facility] and d/c home with daughter following negative workup, but pt was brough to [facility] ED due to increased confusion. Pt found to be febrile and COVID+ and admitted for acute metabolic encephalopathy. MRI brain 4/14 negative for acute changes. CXR 4/15 negative for active disease. EEG 4/16: mild to moderate diffuse encephalopathy, nonspecific etiology. No seizures or epileptiform discharges. Psych consulted due to severe delirium.  No data recorded Assessment / Plan / Recommendation CHL IP CLINICAL IMPRESSIONS 07/02/2020 Clinical Impression Pt presented with oropharyngeal dysphagia characterized by impaired bolus control and a pharyngeal delay. He demonstrated premature spillage to the pyriform sinuses, penetration (PAS 5) and  aspiration (PAS 7, 8) of thin liquids via cup and straw. Pt was unable to demonstrate compensatory strategies despite verbal prompts and cues and aspiration persisted despite use of 5cc and 10cc boluses. Laryngeal invasion was improved to PAS 2 with nectar thick liquids which is considered WNL. Aspiration resulted in an ineffective cough on two occasions, but all other instances were silent. Per EMR, pt has required augmentative IV fluids every few days due to poor p.o. intake, and SLP anticipates that use of nectar thick liquids to eliminate aspiration would likely not improve this. It is anticipated that the pt has had intermittent aspiration of thin liquids throughout this 21-day admission. Pt's daughter, [name], was contacted via phone and she reported that the pt had also been inconsistently symptomic of aspiration prior to admission so his aspiration may be chronic issue. Pt is ambulatory, afebrile, and all five of his CXRs have been negative for active disease. Pt's case was discussed with Dr. Waldron Labs including potential options for dysphagia management considering dehydration risk vs pulmonary risk. Considering his tolerance of intermittent aspiration thus far, it was agreed that his current diet will be continued with strict observance of swallowing precautions, consistent pt mobilization, and consistent oral care. Pt's daughter, [name], was educated regarding the pt's performance, concerns regarding potential for dehydration due to avoidance of liquids, and the potential progression of dysphagia in the setting of dementia. All of her questions were answered and she verbalized agreement with plan of care. SLP Visit Diagnosis Dysphagia, oropharyngeal phase (R13.12) Attention and concentration deficit following -- Frontal lobe and executive function deficit following -- Impact on safety and function Mild aspiration risk;Moderate aspiration risk   CHL IP TREATMENT RECOMMENDATION 07/02/2020 Treatment  Recommendations Therapy as outlined in treatment plan below   Prognosis 07/02/2020 Prognosis for Safe Diet Advancement Fair Barriers to Reach Goals Cognitive deficits Barriers/Prognosis Comment -- CHL IP DIET RECOMMENDATION 07/02/2020 SLP Diet Recommendations Dysphagia 3 (Mech soft) solids;Thin liquid Liquid Administration via Cup;No straw Medication Administration Whole meds with puree Compensations Minimize environmental distractions;Slow rate;Small sips/bites Postural Changes Seated upright at 90 degrees   CHL IP OTHER RECOMMENDATIONS 07/02/2020 Recommended Consults -- Oral Care Recommendations Oral care BID;Staff/trained caregiver to provide oral care Other Recommendations --   CHL IP FOLLOW UP RECOMMENDATIONS 07/02/2020 Follow up Recommendations Skilled Nursing facility   [facility] IP FREQUENCY AND DURATION 07/02/2020 Speech Therapy Frequency (ACUTE ONLY) min 1 x/week Treatment Duration 2 weeks      CHL IP ORAL PHASE 07/02/2020 Oral Phase Impaired Oral - Pudding Teaspoon -- Oral - Pudding Cup -- Oral - Honey Teaspoon -- Oral -  Honey Cup -- Oral - Nectar Teaspoon -- Oral - Nectar Cup -- Oral - Nectar Straw Premature spillage;Decreased bolus cohesion Oral - Thin Teaspoon -- Oral - Thin Cup Premature spillage;Decreased bolus cohesion Oral - Thin Straw Premature spillage;Decreased bolus cohesion Oral - Puree Decreased bolus cohesion;Premature spillage Oral - Mech Soft -- Oral - Regular WFL Oral - Multi-Consistency -- Oral - Pill Decreased bolus cohesion;Premature spillage Oral Phase - Comment --  CHL IP PHARYNGEAL PHASE 07/02/2020 Pharyngeal Phase Impaired Pharyngeal- Pudding Teaspoon -- Pharyngeal -- Pharyngeal- Pudding Cup -- Pharyngeal -- Pharyngeal- Honey Teaspoon -- Pharyngeal -- Pharyngeal- Honey Cup -- Pharyngeal -- Pharyngeal- Nectar Teaspoon -- Pharyngeal -- Pharyngeal- Nectar Cup Delayed swallow initiation-vallecula Pharyngeal -- Pharyngeal- Nectar Straw Delayed swallow initiation-vallecula;Delayed swallow  initiation-pyriform sinuses;Penetration/Aspiration during swallow Pharyngeal Material enters airway, remains ABOVE vocal cords then ejected out Pharyngeal- Thin Teaspoon -- Pharyngeal -- Pharyngeal- Thin Cup Delayed swallow initiation-pyriform sinuses;Penetration/Aspiration during swallow;Penetration/Apiration after swallow Pharyngeal Material enters airway, CONTACTS cords and not ejected out;Material enters airway, passes BELOW cords and not ejected out despite cough attempt by patient;Material enters airway, passes BELOW cords without attempt by patient to eject out (silent aspiration) Pharyngeal- Thin Straw Delayed swallow initiation-pyriform sinuses;Penetration/Aspiration during swallow;Penetration/Apiration after swallow Pharyngeal Material enters airway, CONTACTS cords and not ejected out;Material enters airway, passes BELOW cords without attempt by patient to eject out (silent aspiration);Material enters airway, passes BELOW cords and not ejected out despite cough attempt by patient Pharyngeal- Puree Delayed swallow initiation-vallecula Pharyngeal -- Pharyngeal- Mechanical Soft -- Pharyngeal -- Pharyngeal- Regular Delayed swallow initiation-vallecula Pharyngeal -- Pharyngeal- Multi-consistency -- Pharyngeal -- Pharyngeal- Pill Delayed swallow initiation-vallecula Pharyngeal -- Pharyngeal Comment --  CHL IP CERVICAL ESOPHAGEAL PHASE 07/02/2020 Cervical Esophageal Phase WFL Pudding Teaspoon -- Pudding Cup -- Honey Teaspoon -- Honey Cup -- Nectar Teaspoon -- Nectar Cup -- Nectar Straw -- Thin Teaspoon -- Thin Cup -- Thin Straw -- Puree -- Mechanical Soft -- Regular -- Multi-consistency -- Pill -- Cervical Esophageal Comment -- Shanika I. Hardin Negus, [place], [place] Office number [phone] Pager [phone] Horton Marshall 07/02/2020, 3:23 PM              EEG adult  Result Date: 06/29/2020 Lora Havens, MD     06/29/2020  1:23 PM Patient Name: Patrick Phillips MRN: [id number] Epilepsy  Attending: Lora Havens Referring Physician/Provider: Dr Amie Portland Date: 06/29/2020 Duration: 21.43 mins Patient history: 80 year old male with waxing and waning mental status.  EEG to evaluate for seizures. Level of alertness: Awake AEDs during EEG study: Depakote Technical aspects: This EEG study was done with scalp electrodes positioned according to the 10-20 International system of electrode placement. Electrical activity was acquired at a sampling rate of 500Hz  and reviewed with a high frequency filter of 70Hz  and a low frequency filter of 1Hz . EEG data were recorded continuously and digitally stored. Description: The posterior dominant rhythm consists of 8-9 Hz activity of moderate voltage (25-35 uV) seen predominantly in posterior head regions, symmetric and reactive to eye opening and eye closing. EEG showed intermittent generalized 5 to 6 Hz theta slowing. Hyperventilation and photic stimulation were not performed.   ABNORMALITY - Intermittent slow, generalized IMPRESSION: This study is suggestive of mild diffuse encephalopathy, nonspecific etiology. No seizures or epileptiform discharges were seen throughout the recording. Lora Havens   EEG adult  Result Date: 06/12/2020 Lora Havens, MD     06/12/2020  3:07 PM Patient Name: Ramar Grass MRN: [id number] Epilepsy Attending: Lora Havens Referring  Physician/Provider: Dr Oren Binet Date: 06/12/2020 Duration: 25.42 mins Patient history: 80yo M with ams. EEG to evaluate for seizure Level of alertness: Awake AEDs during EEG study: None Technical aspects: This EEG study was done with scalp electrodes positioned according to the 10-20 International system of electrode placement. Electrical activity was acquired at a sampling rate of 500Hz  and reviewed with a high frequency filter of 70Hz  and a low frequency filter of 1Hz . EEG data were recorded continuously and digitally stored. Description: No posterior dominant rhythm was seen. EEG showed  continuous generalized 5 to 6 Hz theta slowing. Hyperventilation and photic stimulation were not performed.   ABNORMALITY - Continuous slow, generalized IMPRESSION: This study is suggestive of mild to moderate diffuse encephalopathy, nonspecific etiology. No seizures or epileptiform discharges were seen throughout the recording. Priyanka Barbra Sarks   DG Hips Bilat W or Wo Pelvis 3-4 Views  Result Date: 06/10/2020 CLINICAL DATA:  Found on side of road, last seen normal at 2130 hours last night, fall, dementia, tenderness of hips bilaterally EXAM: DG HIP (WITH OR WITHOUT PELVIS) 3-4V BILAT COMPARISON:  None FINDINGS: Osseous demineralization. Hip and SI joint spaces preserved. No acute fracture, dislocation, or bone destruction. Mild degenerative disc disease changes at visualized lower lumbar spine. IMPRESSION: No acute osseous abnormalities. Electronically Signed   By: Lavonia Dana M.D.   On: 06/10/2020 11:48   CT HEAD CODE STROKE WO CONTRAST`  Result Date: 06/28/2020 CLINICAL DATA:  Code stroke.  Confusion.  Altered mental status. EXAM: CT HEAD WITHOUT CONTRAST TECHNIQUE: Contiguous axial images were obtained from the base of the skull through the vertex without intravenous contrast. COMPARISON:  MRI 06/10/2020 FINDINGS: Brain: Age related volume loss. No evidence of old or acute small or large vessel infarction. No mass lesion, hemorrhage, hydrocephalus or extra-axial collection. Dilated perivascular space at the base of the brain. Vascular: There is atherosclerotic calcification of the major vessels at the base of the brain. Skull: Negative Sinuses/Orbits: Clear/normal Other: None ASPECTS ([place] Stroke Program Early CT Score) - Ganglionic level infarction (caudate, lentiform nuclei, internal capsule, insula, M1-M3 cortex): 7 - Supraganglionic infarction (M4-M6 cortex): 3 Total score (0-10 with 10 being normal): 10 IMPRESSION: 1. Normal head CT for age. Atherosclerotic calcification of the major vessels at  base of the brain. 2. ASPECTS is 10 3. These results were communicated to Dr. Rory Percy at 7:18 pmon 5/2/2022by text page via the [facility] messaging system. Electronically Signed   By: Nelson Chimes M.D.   On: 06/28/2020 19:19

## 2020-07-06 NOTE — Progress Notes (Addendum)
Occupational Therapy Treatment Patient Details Name: Patrick Phillips MRN: [id number] DOB: January 26, 1941 Today's Date: 07/06/2020    History of present illness 80 y.o. male with presented with fever and confusion. Pt with dementia however still highly functional, took dog for a walk and dog came back without him. Pt later found lying on the side of road with no memory of what had happened but able to follow commands, taken to [facility] where MRI was negative for stroke and x-ray negative for fx. D/c home with daughter but pt with increasing confusion pt then brought  to [facility] ED 06/10/20. Pt with increasing confusion and agitation. Pt found to be febrile and COVID+. Admitted for acute metabolic encephalopathy.    PMHx of vascular dementia, prior CVA, DM-2, HTN   OT comments  Pt with gradual progress towards OT goals with overall improved mobility abilities and improving cognition from admission (though deficits still evident). Pt able to demo multi-step ADLs standing at sink with Min A with cues to initiate and correct intermittently though demo emerging problem solving. Pt overall min guard with light handheld assist for hallway mobility to safely guide around obstacles. Pt with some increased confusion/anxious behaviors noted at end of session, reporting "I can't breathe". Assessed vitals (SpO2 96% on RA), encouraged slow breathing and placed 1 L O2 on pt to decrease anxiety. RN aware and present at end of session.    Follow Up Recommendations  SNF;Supervision/Assistance - 24 hour    Equipment Recommendations  None recommended by OT    Recommendations for Other Services      Precautions / Restrictions Precautions Precautions: Fall Precaution Comments: found down along side of road; h/o combativeness with dementia Restrictions Weight Bearing Restrictions: No       Mobility Bed Mobility               General bed mobility comments: received in chair    Transfers Overall transfer level:  Needs assistance Equipment used: None;1 person hand held assist Transfers: Sit to/from Stand Sit to Stand: Modified independent (Device/Increase time)         General transfer comment: Mod(I), no physical assist given    Balance Overall balance assessment: Needs assistance Sitting-balance support: No upper extremity supported;Feet supported Sitting balance-Leahy Scale: Good     Standing balance support: No upper extremity supported;Single extremity supported;During functional activity Standing balance-Leahy Scale: Good Standing balance comment: min guard for safety with light handheld assist but able to mobilize in room without assist                           ADL either performed or assessed with clinical judgement   ADL Overall ADL's : Needs assistance/impaired     Grooming: Minimal assistance;Standing;Oral care Grooming Details (indicate cue type and reason): Min A to brush teeth standing at sink. Cues to initiate with pt placing toothbrush in mouth. Min A for placement of toothpaste on toothbrush and reoriented to task. able to demo ability to recognize when to rinse/spit and wipe mouth         Upper Body Dressing : Minimal assistance;Sitting Upper Body Dressing Details (indicate cue type and reason): Min A with tactile cues to don gown around back. Min A to doff at end of session Lower Body Dressing: Moderate assistance;Sit to/from stand Lower Body Dressing Details (indicate cue type and reason): Mod A to don new socks. pt able to bend to R foot and don sock well, but  became distracted and perseverating on difficulty breathing so OT assist to don L sock             Functional mobility during ADLs: Cueing for safety;Cueing for sequencing;Min guard General ADL Comments: Demo ability of following of commands today, a bit more confused today and perseverating on difficulty breathing at end of session though vitals WFL. Able to mobilize in hallway with light  handheld assist     Vision   Vision Assessment?: No apparent visual deficits   Perception     Praxis      Cognition Arousal/Alertness: Awake/alert Behavior During Therapy: Flat affect;Anxious Overall Cognitive Status: History of cognitive impairments - at baseline Area of Impairment: Attention;Memory;Following commands;Safety/judgement;Awareness;Problem solving;Orientation                   Current Attention Level: Selective Memory: Decreased recall of precautions;Decreased short-term memory Following Commands: Follows one step commands with increased time Safety/Judgement: Decreased awareness of deficits;Decreased awareness of safety Awareness: Emergent Problem Solving: Requires verbal cues;Requires tactile cues;Difficulty sequencing;Slow processing General Comments: Pt with flat affect, able to follow one step commands consistently. Was slightly limited with anxious behavior reporting inability to breathe though vitals WFL. Generally able to redirect well today        Exercises     Shoulder Instructions       General Comments SpO2 96% after pt reporting difficulty breathing. HR 80s. Attempted to guide pt in slow breathing and relaxation strategies with application of 1 L O2. Safety sitter present and RN entering, rec increasing to 2 L and continue slow breathing attempts    Pertinent Vitals/ Pain       Pain Assessment: No/denies pain  Home Living                                          Prior Functioning/Environment              Frequency  Min 2X/week        Progress Toward Goals  OT Goals(current goals can now be found in the care plan section)  Progress towards OT goals: Progressing toward goals  Acute Rehab OT Goals Patient Stated Goal: go home today OT Goal Formulation: With patient Time For Goal Achievement: 07/14/20 Potential to Achieve Goals: Fair ADL Goals Pt Will Perform Grooming: with supervision;standing Pt Will  Perform Upper Body Bathing: with supervision;sitting;with caregiver independent in assisting Pt Will Perform Lower Body Bathing: with supervision;sitting/lateral leans;sit to/from stand;with caregiver independent in assisting Pt Will Perform Upper Body Dressing: with supervision;sitting Pt Will Perform Lower Body Dressing: with supervision;sitting/lateral leans;sit to/from stand;with caregiver independent in assisting Pt Will Transfer to Toilet: with supervision;ambulating Pt Will Perform Toileting - Clothing Manipulation and hygiene: with supervision;sitting/lateral leans;sit to/from stand;with caregiver independent in assisting Pt Will Perform Tub/Shower Transfer: with min guard assist;with supervision;ambulating;with caregiver independent in assisting;shower seat  Plan Discharge plan remains appropriate    Co-evaluation                 AM-PAC OT "6 Clicks" Daily Activity     Outcome Measure   Help from another person eating meals?: A Little Help from another person taking care of personal grooming?: A Little Help from another person toileting, which includes using toliet, bedpan, or urinal?: A Lot Help from another person bathing (including washing, rinsing, drying)?: A Lot Help from another person to put  on and taking off regular upper body clothing?: A Little Help from another person to put on and taking off regular lower body clothing?: A Lot 6 Click Score: 15    End of Session Equipment Utilized During Treatment: Gait belt  OT Visit Diagnosis: Unsteadiness on feet (R26.81);Other abnormalities of gait and mobility (R26.89);History of falling (Z91.81);Muscle weakness (generalized) (M62.81);Other symptoms and signs involving cognitive function   Activity Tolerance Patient tolerated treatment well   Patient Left in chair;with call bell/phone within reach;with nursing/sitter in room   Nurse Communication Mobility status;Other (comment) (O2)        Time: 1238-1300 OT  Time Calculation (min): 22 min  Charges: OT General Charges $OT Visit: 1 Visit OT Treatments $Self Care/Home Management : 8-22 mins  Malachy Chamber, OTR/L Acute Rehab Services Office: [phone]   Layla Maw 07/06/2020, 1:31 PM

## 2020-07-06 NOTE — Plan of Care (Signed)
  Problem: Clinical Measurements: Goal: Will remain free from infection Outcome: Progressing   Problem: Coping: Goal: Level of anxiety will decrease Outcome: Progressing   Problem: Elimination: Goal: Will not experience complications related to urinary retention Outcome: Progressing   Problem: Safety: Goal: Ability to remain free from injury will improve Outcome: Progressing   

## 2020-07-07 DIAGNOSIS — E440 Moderate protein-calorie malnutrition: Secondary | ICD-10-CM | POA: Diagnosis not present

## 2020-07-07 DIAGNOSIS — G9341 Metabolic encephalopathy: Secondary | ICD-10-CM | POA: Diagnosis not present

## 2020-07-07 DIAGNOSIS — U071 COVID-19: Secondary | ICD-10-CM | POA: Diagnosis not present

## 2020-07-07 DIAGNOSIS — I10 Essential (primary) hypertension: Secondary | ICD-10-CM | POA: Diagnosis not present

## 2020-07-07 LAB — GLUCOSE, CAPILLARY
Glucose-Capillary: 125 mg/dL — ABNORMAL HIGH (ref 70–99)
Glucose-Capillary: 201 mg/dL — ABNORMAL HIGH (ref 70–99)
Glucose-Capillary: 344 mg/dL — ABNORMAL HIGH (ref 70–99)
Glucose-Capillary: 319 mg/dL — ABNORMAL HIGH (ref 70–99)
Glucose-Capillary: 181 mg/dL — ABNORMAL HIGH (ref 70–99)

## 2020-07-07 NOTE — Progress Notes (Addendum)
PROGRESS NOTE                                                                                                                                                                                                             Patient Demographics:    Patrick Phillips, is a 80 y.o. male, DOB - 03/26/1940, [id number]  Outpatient Primary MD for the patient is [name], MD   Admit date - 06/10/2020   LOS - 36  Chief Complaint  Patient presents with  . Altered Mental Status       Brief Narrative: Patient is a 80 y.o. male with PMHx of vascular dementia, prior CVA, DM-2, HTN-who presented with fever and confusion.  He was thought to have COVID-19 infection-and acute metabolic encephalopathy related to COVID-19 infection.  Hospital course complicated by persistent delirium.  See below for further details.  COVID-19 vaccinated status: Unknown  Significant Events: 4/14>> Admit to [facility] for fever/confusion  Significant studies: 4/14>>Chest x-ray: No PNA 4/14>> CT head: No acute intracranial abnormality. 4/14>> CT C-spine: No fracture 4/14>> MRI brain: No acute infarct. 4/15>> chest x-ray: No PNA 4/15>> CSF: WBC 1 (tube #4), protein 46, glucose 54 4/16>> EEG: Mild to moderate diffuse encephalopathy-no seizures.  COVID-19 medications: Remdesivir: 4/15>>4/17  Antibiotics: Vancomycin: 4/14 x1 Ceftriaxone: 4/14 x 1 Ampicillin: 4/14 x 1 Doxy: 4/22>> 4/26   Microbiology data: 4/14 >>blood culture: No growth 4/14>> urine culture: No growth 4/15>> blood culture: No growth 4/15>> urine culture: Multiple species 4/15>> CSF culture: No growth 4/15>> CSF PCR HSV 1/2: Negative. 4/15>> CSF HSV culture: Negative 4/15>> CSF arbovirus IgG: Negative 4/15>> CSF VDRL: Negative RMSF CSF serology IgM -ve  Procedures:  4/15>> lumbar puncture in the emergency room  Consults:  Neurology, psychiatry, Pall.Care  DVT prophylaxis:   enoxaparin (LOVENOX) injection 40 mg Start: 06/11/20 0600    Subjective:   Remains pleasantly confused.  No major issues overnight.   Assessment  & Plan :   Acute metabolic encephalopathy-superimposed on underlying Dementia with delirium: Initially encephalopathy was due to COVID-19 infection-however lately his altered mental status is more consistent with delirium related to dementia.  Neurology/psychiatry consulted-however  he continues to have waxing and waning delirium.  Currently remains stable on Seroquel and Depakote.  Awaiting Geri psych/SNF placement.  ? Tick Bite: 2 ticks found by nursing staff on patient 06/17/20 - RMSF IgM - ve, DW ID Dr Tommy Medal 06/23/20- not acute, no Rx.  AKI: Mild-likely hemodynamically mediated-resolved with IV fluids.  Dysphagia:-Patient followed closely by SLP, had MBS done 5/8, previously on dysphagia 3 diet-prior MD discussed with family-has been upgraded to a regular diet.  Borderline vitamin B12 deficiency: On supplementation.  Hypokalemia: Repleted-recheck periodically.  HTN: BP stable on Coreg, Norvasc, and hydralazine.  History of CVA: MRI brain negative x2-on aspirin/statin.  Dementia with delirium: See above  Moderate protein calorie malnutrition  GERD: On PPI  SIRS-COVID-19 infection: Completed Remdesivir x3 days.  Not hypoxic-COVID-19 isolation completed on 4/25.   No results for input(s): DDIMER, FERRITIN, LDH, CRP in the last 72 hours.  Lab Results  Component Value Date   [place] (A) 06/11/2020   [place] NEGATIVE 01/13/2020   DM-2: CBGs relatively stable-continue SSI-oral hypoglycemic agents on hold  Recent Labs    07/06/20 2130 07/07/20 0738 07/07/20 1226  GLUCAP 225* 125* 201*     Condition -   Guarded  Family Communication  : None at bedside  Code Status :  DNR  Diet :  Diet Order            Diet regular Room service appropriate? No; Fluid consistency: Thin  Diet effective now                   Disposition Plan  :   Status is: Inpatient  Remains inpatient appropriate because:Inpatient level of care appropriate due to severity of illness   Dispo: The patient is from: Home              Anticipated d/c is to: SNF              Patient currently is medically stable to d/c. awaiting SNF bed availability   Difficult to place patient No   Barriers to discharge: Awaiting SNF/Geri psych bed.  Antimicorbials  :    Anti-infectives (From admission, onward)   Start     Dose/Rate Route Frequency Ordered Stop   06/18/20 1745  doxycycline (VIBRAMYCIN) 100 mg in sodium chloride 0.9 % 250 mL IVPB  Status:  Discontinued        100 mg 125 mL/hr over 120 Minutes Intravenous Every 12 hours 06/18/20 1648 06/23/20 0847   06/12/20 1000  remdesivir 100 mg in sodium chloride 0.9 % 100 mL IVPB        100 mg 200 mL/hr over 30 Minutes Intravenous Daily 06/11/20 0435 06/13/20 0900   06/11/20 0445  remdesivir 200 mg in sodium chloride 0.9% 250 mL IVPB        200 mg 580 mL/hr over 30 Minutes Intravenous Once 06/11/20 0434 06/11/20 0929   06/11/20 0230  vancomycin (VANCOREADY) IVPB 1500 mg/300 mL        1,500 mg 150 mL/hr over 120 Minutes Intravenous  Once 06/11/20 0229 06/11/20 0812   06/11/20 0215  ampicillin (OMNIPEN) 2 g in sodium chloride 0.9 % 100 mL IVPB        2 g 300 mL/hr over 20 Minutes Intravenous  Once 06/11/20 0157 06/11/20 0550   06/11/20 0215  vancomycin (VANCOREADY) IVPB 1000 mg/200 mL  Status:  Discontinued        1,000 mg 200 mL/hr over 60 Minutes Intravenous  Once  06/11/20 0157 06/11/20 0229   06/11/20 0200  cefTRIAXone (ROCEPHIN) 2 g in sodium chloride 0.9 % 100 mL IVPB        2 g 200 mL/hr over 30 Minutes Intravenous  Once 06/11/20 0157 06/11/20 0248      Inpatient Medications  Scheduled Meds: . amLODipine  10 mg Oral Daily  . aspirin EC  81 mg Oral Daily  . atorvastatin  40 mg Oral Daily  . carvedilol  6.25 mg Oral BID WC  . divalproex  250 mg Oral Q12H  .  enoxaparin (LOVENOX) injection  40 mg Subcutaneous Q24H  . feeding supplement  237 mL Oral BID BM  . fluticasone  2 spray Each Nare Daily  . hydrALAZINE  50 mg Oral Q8H  . insulin aspart  0-9 Units Subcutaneous TID WC  . LORazepam  0.5 mg Intravenous Once  . multivitamin with minerals  1 tablet Oral Daily  . naLOXone (NARCAN)  injection  0.4 mg Intravenous Once  . pantoprazole  40 mg Oral Daily  . QUEtiapine  50 mg Oral Q2000  . senna  2 tablet Oral Daily  . tamsulosin  0.4 mg Oral Daily  . thiamine  100 mg Oral Daily  . vitamin B-12  1,000 mcg Oral Daily   Continuous Infusions:  PRN Meds:.acetaminophen, haloperidol lactate, hydrALAZINE, [DISCONTINUED] ondansetron **OR** ondansetron (ZOFRAN) IV, sodium chloride   See all Orders from today for further details   Oren Binet M.D on 07/07/2020 at 3:14 PM  To page go to [email or web address] - use universal password  [facility] -  Office  [phone]    Objective:   Vitals:   07/07/20 0524 07/07/20 0527 07/07/20 1110 07/07/20 1402  BP: (!) 150/42 (!) 150/42  (!) 159/63  Pulse:  71  82  Resp:  18  20  Temp:  98.2 F (36.8 C)    TempSrc:  Oral    SpO2:  98% 99% 99%  Weight:      Height:        Wt Readings from Last 3 Encounters:  06/10/20 86.2 kg  06/10/20 86.2 kg  04/19/20 86.2 kg     Intake/Output Summary (Last 24 hours) at 07/07/2020 1514 Last data filed at 07/07/2020 1440 Gross per 24 hour  Intake 1700 ml  Output 951 ml  Net 749 ml     Physical Exam Gen Exam: Pleasantly confused HEENT:atraumatic, normocephalic Chest: B/L clear to auscultation anteriorly CVS:S1S2 regular Abdomen:soft non tender, non distended Extremities:no edema Neurology: Non focal Skin: no rash     Data Review:    CBC Recent Labs  Lab 07/02/20 0655  WBC 10.8*  HGB 12.5*  HCT 37.8*  PLT 254  MCV 85.1  MCH 28.2  MCHC 33.1  RDW 13.6    Chemistries  Recent Labs  Lab 07/02/20 0655  NA 137  K 3.9  CL 106  CO2  24  GLUCOSE 164*  BUN 14  CREATININE 0.93  CALCIUM 9.2   ------------------------------------------------------------------------------------------------------------------ No results for input(s): CHOL, HDL, LDLCALC, TRIG, CHOLHDL, LDLDIRECT in the last 72 hours.  Lab Results  Component Value Date   HGBA1C 6.6 (H) 06/11/2020   ------------------------------------------------------------------------------------------------------------------ No results for input(s): TSH, T4TOTAL, T3FREE, THYROIDAB in the last 72 hours.  Invalid input(s): FREET3 ------------------------------------------------------------------------------------------------------------------ No results for input(s): VITAMINB12, FOLATE, FERRITIN, TIBC, IRON, RETICCTPCT in the last 72 hours.  Coagulation profile No results for input(s): INR, PROTIME in the last 168 hours.  No results for input(s): DDIMER in the last  72 hours.  Cardiac Enzymes No results for input(s): CKMB, TROPONINI, MYOGLOBIN in the last 168 hours.  Invalid input(s): CK ------------------------------------------------------------------------------------------------------------------    Component Value Date/Time   BNP 39.4 06/28/2020 0044    Micro Results No results found for this or any previous visit (from the past 240 hour(s)).  Radiology Reports CT Head Wo Contrast  Result Date: 06/10/2020 CLINICAL DATA:  Head and C-spine injury, fall, altered mental status EXAM: CT HEAD WITHOUT CONTRAST CT CERVICAL SPINE WITHOUT CONTRAST TECHNIQUE: Multidetector CT imaging of the head and cervical spine was performed following the standard protocol without intravenous contrast. Multiplanar CT image reconstructions of the cervical spine were also generated. COMPARISON:  05/04/2020 FINDINGS: CT HEAD FINDINGS Brain: Stable mild brain atrophy pattern. No acute intracranial hemorrhage, new mass lesion, acute infarction, midline shift, herniation, hydrocephalus, or  extra-axial fluid collection. No focal mass effect or edema. Cisterns are patent. Cerebellar atrophy as well. Vascular: Intracranial atherosclerosis at the skull base. No hyperdense vessel. Skull: Normal. Negative for fracture or focal lesion. Sinuses/Orbits: Minor scattered sinus mucosal thickening as before. Orbits are symmetric. No acute finding. Other: None. CT CERVICAL SPINE FINDINGS Alignment: Normal. Skull base and vertebrae: No acute fracture. No primary bone lesion or focal pathologic process. Soft tissues and spinal canal: Normal prevertebral soft tissues. No hemorrhage or hematoma appreciated. Carotid atherosclerosis. Disc levels: Similar mild multilevel degenerative disc disease and facet arthropathy. Facets are aligned. No subluxation or dislocation. Preserved vertebral body heights. Upper chest: Negative. Other: None. IMPRESSION: Stable brain atrophy pattern. No acute intracranial abnormality or interval change by noncontrast CT. Stable mild cervical degenerative changes as above. No acute osseous finding or malalignment by CT. Electronically Signed   By: Jerilynn Mages.  Shick M.D.   On: 06/10/2020 12:31   CT Cervical Spine Wo Contrast  Result Date: 06/10/2020 CLINICAL DATA:  Head and C-spine injury, fall, altered mental status EXAM: CT HEAD WITHOUT CONTRAST CT CERVICAL SPINE WITHOUT CONTRAST TECHNIQUE: Multidetector CT imaging of the head and cervical spine was performed following the standard protocol without intravenous contrast. Multiplanar CT image reconstructions of the cervical spine were also generated. COMPARISON:  05/04/2020 FINDINGS: CT HEAD FINDINGS Brain: Stable mild brain atrophy pattern. No acute intracranial hemorrhage, new mass lesion, acute infarction, midline shift, herniation, hydrocephalus, or extra-axial fluid collection. No focal mass effect or edema. Cisterns are patent. Cerebellar atrophy as well. Vascular: Intracranial atherosclerosis at the skull base. No hyperdense vessel. Skull:  Normal. Negative for fracture or focal lesion. Sinuses/Orbits: Minor scattered sinus mucosal thickening as before. Orbits are symmetric. No acute finding. Other: None. CT CERVICAL SPINE FINDINGS Alignment: Normal. Skull base and vertebrae: No acute fracture. No primary bone lesion or focal pathologic process. Soft tissues and spinal canal: Normal prevertebral soft tissues. No hemorrhage or hematoma appreciated. Carotid atherosclerosis. Disc levels: Similar mild multilevel degenerative disc disease and facet arthropathy. Facets are aligned. No subluxation or dislocation. Preserved vertebral body heights. Upper chest: Negative. Other: None. IMPRESSION: Stable brain atrophy pattern. No acute intracranial abnormality or interval change by noncontrast CT. Stable mild cervical degenerative changes as above. No acute osseous finding or malalignment by CT. Electronically Signed   By: Jerilynn Mages.  Shick M.D.   On: 06/10/2020 12:31   MR BRAIN WO CONTRAST  Result Date: 06/29/2020 CLINICAL DATA:  Right-sided facial drooping, altered mental status. EXAM: MRI HEAD WITHOUT CONTRAST TECHNIQUE: Multiplanar, multiecho pulse sequences of the brain and surrounding structures were obtained without intravenous contrast. COMPARISON:  June 10, 2020. FINDINGS: Brain: No acute infarction, hemorrhage,  hydrocephalus, extra-axial collection or mass lesion. Dilated perivascular space in inferior right basal ganglia. No substantial white matter disease for patient age. Vascular: Partial loss of flow void in the distal left vertebral artery, which could be due to stenosis. Normal flow void in the visualized proximal aICAs. Skull and upper cervical spine: Normal marrow signal. Sinuses/Orbits: Mild mucosal thickening of the frontal sinuses and moderate mucosal thickening of scattered ethmoid air cells. Inferior left maxillary sinus mucous retention cyst. Bilateral exopthalmos, similar to prior. Other: No mastoid effusions. IMPRESSION: 1. No evidence of  acute abnormality. 2. Partial loss of flow void in the distal left vertebral artery, similar to prior and possibly due to stenosis. Consider CT angio head and neck to further evaluate. 3. Frontoethmoidal paranasal sinus mucosal thickening. Electronically Signed   By: Margaretha Sheffield MD   On: 06/29/2020 07:47   MR Brain Wo Contrast (neuro protocol)  Result Date: 06/10/2020 CLINICAL DATA:  Acute neuro deficit.  Mental status change. EXAM: MRI HEAD WITHOUT CONTRAST TECHNIQUE: Multiplanar, multiecho pulse sequences of the brain and surrounding structures were obtained without intravenous contrast. COMPARISON:  CT head 06/10/2020 FINDINGS: Brain: Ventricle size and cerebral volume normal for age. Negative for acute infarct. No significant chronic ischemic change. Negative for hemorrhage or mass. Image quality degraded by mild motion. Vascular: Partial loss of flow void in the left internal carotid artery through the skull base and the distal left vertebral artery which may be due to slow flow or stenosis. Skull and upper cervical spine: Negative Sinuses/Orbits: Mild mucosal edema paranasal sinuses. No orbital mass. Bilateral exophthalmos. Other: None IMPRESSION: Negative for acute infarct. Partial loss of flow void in the left internal carotid artery distal left vertebral artery which could be due to proximal stenosis. Consider CT angio head and neck for further evaluation. Electronically Signed   By: Franchot Gallo M.D.   On: 06/10/2020 15:09   DG CHEST PORT 1 VIEW  Result Date: 07/04/2020 CLINICAL DATA:  Dyspnea EXAM: PORTABLE CHEST 1 VIEW COMPARISON:  06/25/2020 FINDINGS: Cardiac shadow is within normal limits. The lungs are clear bilaterally. Loop recorder is noted on the left. No bony abnormality is seen. IMPRESSION: No acute abnormality noted. Electronically Signed   By: Inez Catalina M.D.   On: 07/04/2020 21:41   DG Chest Port 1 View  Result Date: 06/25/2020 CLINICAL DATA:  Shortness of breath and  lethargy. EXAM: PORTABLE CHEST 1 VIEW COMPARISON:  Chest x-ray 06/24/2020 FINDINGS: The cardiac silhouette, mediastinal and hilar contours are within limits. Stable mild tortuosity and calcification the thoracic aorta. The lungs are clear of an acute process. No pleural effusions or pulmonary lesions. The bony thorax is intact. IMPRESSION: No acute cardiopulmonary findings. Electronically Signed   By: Marijo Sanes M.D.   On: 06/25/2020 14:15   DG Chest Port 1 View  Result Date: 06/24/2020 CLINICAL DATA:  Shortness of breath. EXAM: PORTABLE CHEST 1 VIEW COMPARISON:  June 20, 2020. FINDINGS: The heart size and mediastinal contours are within normal limits. Both lungs are clear. The visualized skeletal structures are unremarkable. IMPRESSION: No active disease. Electronically Signed   By: Marijo Conception M.D.   On: 06/24/2020 08:13   DG Chest Port 1 View  Result Date: 06/20/2020 CLINICAL DATA:  Shortness of breath EXAM: PORTABLE CHEST 1 VIEW COMPARISON:  06/11/2020 FINDINGS: Loop recorder is identified adjacent to the cardiac apex. Normal heart size. No pleural effusion or edema. No airspace densities identified. No acute osseous findings. IMPRESSION: No acute cardiopulmonary abnormalities.  Electronically Signed   By: Kerby Moors M.D.   On: 06/20/2020 08:34   DG Chest Port 1 View  Result Date: 06/11/2020 CLINICAL DATA:  Questionable sepsis. EXAM: PORTABLE CHEST 1 VIEW COMPARISON:  June 10, 2020 FINDINGS: There is no evidence of acute infiltrate, pleural effusion or pneumothorax. The heart size and mediastinal contours are within normal limits. A radiopaque loop recorder device is seen. The visualized skeletal structures are unremarkable. IMPRESSION: No active disease. Electronically Signed   By: Virgina Norfolk M.D.   On: 06/11/2020 03:10   DG Chest Port 1 View  Result Date: 06/10/2020 CLINICAL DATA:  Altered mental status. EXAM: PORTABLE CHEST 1 VIEW COMPARISON:  May 25, 2020 FINDINGS: Loop  recorder in place. Cardiomediastinal silhouette is normal. Mediastinal contours appear intact. Tortuosity and calcific atherosclerotic disease of the aorta. There is no evidence of focal airspace consolidation, pleural effusion or pneumothorax. Osseous structures are without acute abnormality. Soft tissues are grossly normal. IMPRESSION: No active disease. Electronically Signed   By: Fidela Salisbury M.D.   On: 06/10/2020 11:15   DG Swallowing Func-Speech Pathology  Result Date: 07/02/2020 Objective Swallowing Evaluation: Type of Study: Bedside Swallow Evaluation  Patient Details Name: Hastin Nydam MRN: [id number] Date of Birth: June 02, 1940 Today's Date: 07/02/2020 Time: SLP Start Time (ACUTE ONLY): 73 -SLP Stop Time (ACUTE ONLY): 1249 SLP Time Calculation (min) (ACUTE ONLY): 19 min Past Medical History: Past Medical History: Diagnosis Date . Cancer ([place])   skin cancer . Dementia ([place])  . Diabetes mellitus without complication ([place])  . Hypertension  . Memory deficit  Past Surgical History: Past Surgical History: Procedure Laterality Date . BACK SURGERY   . HERNIA REPAIR   . LOOP RECORDER INSERTION N/A 07/26/2017  Procedure: LOOP RECORDER INSERTION;  Surgeon: Evans Lance, MD;  Location: [place] [facility] LAB;  Service: Cardiovascular;  Laterality: N/A; . TEE WITHOUT CARDIOVERSION N/A 07/26/2017  Procedure: TRANSESOPHAGEAL ECHOCARDIOGRAM (TEE);  Surgeon: Fay Records, MD;  Location: [facility] ENDOSCOPY;  Service: Cardiovascular;  Laterality: N/A; . TRIGGER FINGER RELEASE Right 12/02/2015  Procedure: RIGHT THUMB RELEASE TRIGGER FINGER/A-1 PULLEY;  Surgeon: Leanora Cover, MD;  Location: [place];  Service: Orthopedics;  Laterality: Right; HPI: Pt is a 80 y.o. male with PMHx of vascular dementia, CVA, DM-2, HTN. On 4/14, pt took his dog for a walk and the dog returned home without him. Pt was found lying on the side of road with no memory of what had happened but able to follow commands. Pt was taken to [facility] and  d/c home with daughter following negative workup, but pt was brough to [facility] ED due to increased confusion. Pt found to be febrile and COVID+ and admitted for acute metabolic encephalopathy. MRI brain 4/14 negative for acute changes. CXR 4/15 negative for active disease. EEG 4/16: mild to moderate diffuse encephalopathy, nonspecific etiology. No seizures or epileptiform discharges. Psych consulted due to severe delirium.  No data recorded Assessment / Plan / Recommendation CHL IP CLINICAL IMPRESSIONS 07/02/2020 Clinical Impression Pt presented with oropharyngeal dysphagia characterized by impaired bolus control and a pharyngeal delay. He demonstrated premature spillage to the pyriform sinuses, penetration (PAS 5) and aspiration (PAS 7, 8) of thin liquids via cup and straw. Pt was unable to demonstrate compensatory strategies despite verbal prompts and cues and aspiration persisted despite use of 5cc and 10cc boluses. Laryngeal invasion was improved to PAS 2 with nectar thick liquids which is considered WNL. Aspiration resulted in an ineffective cough on two occasions, but  all other instances were silent. Per EMR, pt has required augmentative IV fluids every few days due to poor p.o. intake, and SLP anticipates that use of nectar thick liquids to eliminate aspiration would likely not improve this. It is anticipated that the pt has had intermittent aspiration of thin liquids throughout this 21-day admission. Pt's daughter, [name], was contacted via phone and she reported that the pt had also been inconsistently symptomic of aspiration prior to admission so his aspiration may be chronic issue. Pt is ambulatory, afebrile, and all five of his CXRs have been negative for active disease. Pt's case was discussed with Dr. Waldron Labs including potential options for dysphagia management considering dehydration risk vs pulmonary risk. Considering his tolerance of intermittent aspiration thus far, it was agreed that his current diet  will be continued with strict observance of swallowing precautions, consistent pt mobilization, and consistent oral care. Pt's daughter, [name], was educated regarding the pt's performance, concerns regarding potential for dehydration due to avoidance of liquids, and the potential progression of dysphagia in the setting of dementia. All of her questions were answered and she verbalized agreement with plan of care. SLP Visit Diagnosis Dysphagia, oropharyngeal phase (R13.12) Attention and concentration deficit following -- Frontal lobe and executive function deficit following -- Impact on safety and function Mild aspiration risk;Moderate aspiration risk   CHL IP TREATMENT RECOMMENDATION 07/02/2020 Treatment Recommendations Therapy as outlined in treatment plan below   Prognosis 07/02/2020 Prognosis for Safe Diet Advancement Fair Barriers to Reach Goals Cognitive deficits Barriers/Prognosis Comment -- CHL IP DIET RECOMMENDATION 07/02/2020 SLP Diet Recommendations Dysphagia 3 (Mech soft) solids;Thin liquid Liquid Administration via Cup;No straw Medication Administration Whole meds with puree Compensations Minimize environmental distractions;Slow rate;Small sips/bites Postural Changes Seated upright at 90 degrees   CHL IP OTHER RECOMMENDATIONS 07/02/2020 Recommended Consults -- Oral Care Recommendations Oral care BID;Staff/trained caregiver to provide oral care Other Recommendations --   CHL IP FOLLOW UP RECOMMENDATIONS 07/02/2020 Follow up Recommendations Skilled Nursing facility   [facility] IP FREQUENCY AND DURATION 07/02/2020 Speech Therapy Frequency (ACUTE ONLY) min 1 x/week Treatment Duration 2 weeks      CHL IP ORAL PHASE 07/02/2020 Oral Phase Impaired Oral - Pudding Teaspoon -- Oral - Pudding Cup -- Oral - Honey Teaspoon -- Oral - Honey Cup -- Oral - Nectar Teaspoon -- Oral - Nectar Cup -- Oral - Nectar Straw Premature spillage;Decreased bolus cohesion Oral - Thin Teaspoon -- Oral - Thin Cup Premature spillage;Decreased bolus  cohesion Oral - Thin Straw Premature spillage;Decreased bolus cohesion Oral - Puree Decreased bolus cohesion;Premature spillage Oral - Mech Soft -- Oral - Regular WFL Oral - Multi-Consistency -- Oral - Pill Decreased bolus cohesion;Premature spillage Oral Phase - Comment --  CHL IP PHARYNGEAL PHASE 07/02/2020 Pharyngeal Phase Impaired Pharyngeal- Pudding Teaspoon -- Pharyngeal -- Pharyngeal- Pudding Cup -- Pharyngeal -- Pharyngeal- Honey Teaspoon -- Pharyngeal -- Pharyngeal- Honey Cup -- Pharyngeal -- Pharyngeal- Nectar Teaspoon -- Pharyngeal -- Pharyngeal- Nectar Cup Delayed swallow initiation-vallecula Pharyngeal -- Pharyngeal- Nectar Straw Delayed swallow initiation-vallecula;Delayed swallow initiation-pyriform sinuses;Penetration/Aspiration during swallow Pharyngeal Material enters airway, remains ABOVE vocal cords then ejected out Pharyngeal- Thin Teaspoon -- Pharyngeal -- Pharyngeal- Thin Cup Delayed swallow initiation-pyriform sinuses;Penetration/Aspiration during swallow;Penetration/Apiration after swallow Pharyngeal Material enters airway, CONTACTS cords and not ejected out;Material enters airway, passes BELOW cords and not ejected out despite cough attempt by patient;Material enters airway, passes BELOW cords without attempt by patient to eject out (silent aspiration) Pharyngeal- Thin Straw Delayed swallow initiation-pyriform sinuses;Penetration/Aspiration during swallow;Penetration/Apiration after swallow Pharyngeal Material  enters airway, CONTACTS cords and not ejected out;Material enters airway, passes BELOW cords without attempt by patient to eject out (silent aspiration);Material enters airway, passes BELOW cords and not ejected out despite cough attempt by patient Pharyngeal- Puree Delayed swallow initiation-vallecula Pharyngeal -- Pharyngeal- Mechanical Soft -- Pharyngeal -- Pharyngeal- Regular Delayed swallow initiation-vallecula Pharyngeal -- Pharyngeal- Multi-consistency -- Pharyngeal --  Pharyngeal- Pill Delayed swallow initiation-vallecula Pharyngeal -- Pharyngeal Comment --  CHL IP CERVICAL ESOPHAGEAL PHASE 07/02/2020 Cervical Esophageal Phase WFL Pudding Teaspoon -- Pudding Cup -- Honey Teaspoon -- Honey Cup -- Nectar Teaspoon -- Nectar Cup -- Nectar Straw -- Thin Teaspoon -- Thin Cup -- Thin Straw -- Puree -- Mechanical Soft -- Regular -- Multi-consistency -- Pill -- Cervical Esophageal Comment -- Shanika I. Hardin Negus, [place], [place] Office number [phone] Pager [phone] Horton Marshall 07/02/2020, 3:23 PM              EEG adult  Result Date: 06/29/2020 Lora Havens, MD     06/29/2020  1:23 PM Patient Name: Kagan Tibbitts MRN: [id number] Epilepsy Attending: Lora Havens Referring Physician/Provider: Dr Amie [place] Date: 06/29/2020 Duration: 21.43 mins Patient history: 80 year old male with waxing and waning mental status.  EEG to evaluate for seizures. Level of alertness: Awake AEDs during EEG study: Depakote Technical aspects: This EEG study was done with scalp electrodes positioned according to the 10-20 International system of electrode placement. Electrical activity was acquired at a sampling rate of 500Hz  and reviewed with a high frequency filter of 70Hz  and a low frequency filter of 1Hz . EEG data were recorded continuously and digitally stored. Description: The posterior dominant rhythm consists of 8-9 Hz activity of moderate voltage (25-35 uV) seen predominantly in posterior head regions, symmetric and reactive to eye opening and eye closing. EEG showed intermittent generalized 5 to 6 Hz theta slowing. Hyperventilation and photic stimulation were not performed.   ABNORMALITY - Intermittent slow, generalized IMPRESSION: This study is suggestive of mild diffuse encephalopathy, nonspecific etiology. No seizures or epileptiform discharges were seen throughout the recording. Lora Havens   EEG adult  Result Date: 06/12/2020 Lora Havens, MD      06/12/2020  3:07 PM Patient Name: Fraser Vlahos MRN: [id number] Epilepsy Attending: Lora Havens Referring Physician/Provider: Dr Oren Binet Date: 06/12/2020 Duration: 25.42 mins Patient history: 80yo M with ams. EEG to evaluate for seizure Level of alertness: Awake AEDs during EEG study: None Technical aspects: This EEG study was done with scalp electrodes positioned according to the 10-20 International system of electrode placement. Electrical activity was acquired at a sampling rate of 500Hz  and reviewed with a high frequency filter of 70Hz  and a low frequency filter of 1Hz . EEG data were recorded continuously and digitally stored. Description: No posterior dominant rhythm was seen. EEG showed continuous generalized 5 to 6 Hz theta slowing. Hyperventilation and photic stimulation were not performed.   ABNORMALITY - Continuous slow, generalized IMPRESSION: This study is suggestive of mild to moderate diffuse encephalopathy, nonspecific etiology. No seizures or epileptiform discharges were seen throughout the recording. Priyanka Barbra Sarks   DG Hips Bilat W or Wo Pelvis 3-4 Views  Result Date: 06/10/2020 CLINICAL DATA:  Found on side of road, last seen normal at 2130 hours last night, fall, dementia, tenderness of hips bilaterally EXAM: DG HIP (WITH OR WITHOUT PELVIS) 3-4V BILAT COMPARISON:  None FINDINGS: Osseous demineralization. Hip and SI joint spaces preserved. No acute fracture, dislocation, or bone destruction. Mild degenerative disc disease changes at  visualized lower lumbar spine. IMPRESSION: No acute osseous abnormalities. Electronically Signed   By: Lavonia Dana M.D.   On: 06/10/2020 11:48   CT HEAD CODE STROKE WO CONTRAST`  Result Date: 06/28/2020 CLINICAL DATA:  Code stroke.  Confusion.  Altered mental status. EXAM: CT HEAD WITHOUT CONTRAST TECHNIQUE: Contiguous axial images were obtained from the base of the skull through the vertex without intravenous contrast. COMPARISON:  MRI 06/10/2020  FINDINGS: Brain: Age related volume loss. No evidence of old or acute small or large vessel infarction. No mass lesion, hemorrhage, hydrocephalus or extra-axial collection. Dilated perivascular space at the base of the brain. Vascular: There is atherosclerotic calcification of the major vessels at the base of the brain. Skull: Negative Sinuses/Orbits: Clear/normal Other: None ASPECTS ([place] Stroke Program Early CT Score) - Ganglionic level infarction (caudate, lentiform nuclei, internal capsule, insula, M1-M3 cortex): 7 - Supraganglionic infarction (M4-M6 cortex): 3 Total score (0-10 with 10 being normal): 10 IMPRESSION: 1. Normal head CT for age. Atherosclerotic calcification of the major vessels at base of the brain. 2. ASPECTS is 10 3. These results were communicated to Dr. Rory Percy at 7:18 pmon 5/2/2022by text page via the [facility] messaging system. Electronically Signed   By: Nelson Chimes M.D.   On: 06/28/2020 19:19

## 2020-07-07 NOTE — Progress Notes (Signed)
Nutrition Follow-up  DOCUMENTATION CODES:   Non-severe (moderate) malnutrition in context of acute illness/injury  INTERVENTION:   -Continue Ensure Enlive po BID, each supplement provides 350 kcal and 20 grams of protein -Continue Magic cup BID with meals, each supplement provides 290 kcal and 9 grams of protein -Continue MVI with minerals daily  NUTRITION DIAGNOSIS:   Moderate Malnutrition related to acute illness (COVID) as evidenced by mild fat depletion,mild muscle depletion.  Ongoing  GOAL:   Patient will meet greater than or equal to 90% of their needs  Progressing   MONITOR:   PO intake,Labs,Weight trends,I & O's  REASON FOR ASSESSMENT:   Malnutrition Screening Tool    ASSESSMENT:   Pt admitted with acute metabolic encephalopathy, likely 2/2 febrile illness/COVID-19 infection. PMH includes HTN, DM, Ca, dementia.  5/6- s/p MBSS- advanced to dysphagia 3 diet with thin liquids  Reviewed I/O's: +747 ml x 24 hours and -3.4 L since 06/23/20  UOP: 952 ml x 24 hours  Case discussed with sitter, who reports pt has been restless and requests that this RD not awaken pt. Observed lunch tray- pt consumed about 50% of meal. Per sitter, pt consumed 100% of breakfast. Intake has improved; noted meal completions 80-100%. Per MAR, pt has taken last 3 doses of Ensure.   Per TOC notes, pt awaiting SNF placement.   Medications reviewed and include vitamin B-12.  Labs reviewed: CBGS: 125-225 (inpatient orders for glycemic control are 0-9 units insulin aspart TID with meals).   Diet Order:   Diet Order            Diet regular Room service appropriate? No; Fluid consistency: Thin  Diet effective now                 EDUCATION NEEDS:   No education needs have been identified at this time  Skin:  Skin Assessment: Skin Integrity Issues: Skin Integrity Issues:: Other (Comment) Other: MASD to anus, buttocks, scrotum; non pressure wound to L elbow  Last BM:   07/04/20  Height:   Ht Readings from Last 1 Encounters:  06/10/20 6' (1.829 m)    Weight:   Wt Readings from Last 1 Encounters:  06/10/20 86.2 kg   BMI:  Body mass index is 25.77 kg/m.  Estimated Nutritional Needs:   Kcal:  1950-2150  Protein:  100-110 grams  Fluid:  >1.95L/d    Loistine Chance, RD, LDN, [place] Registered Dietitian II Certified Diabetes Care and Education Specialist Please refer to [facility] for RD and/or RD on-call/weekend/after hours pager

## 2020-07-07 NOTE — Progress Notes (Signed)
Physical Therapy Treatment Patient Details Name: Patrick Phillips MRN: [id number] DOB: 16-Dec-1940 Today's Date: 07/07/2020    History of Present Illness 80 y.o. male with presented with fever and confusion. Pt with dementia however still highly functional, took dog for a walk and dog came back without him. Pt later found lying on the side of road with no memory of what had happened but able to follow commands, taken to [facility] where MRI was negative for stroke and x-ray negative for fx. D/c home with daughter but pt with increasing confusion pt then brought  to [facility] ED 06/10/20. Pt with increasing confusion and agitation. Pt found to be febrile and COVID+. Admitted for acute metabolic encephalopathy.    PMHx of vascular dementia, prior CVA, DM-2, HTN    PT Comments    Patient received in bed, pleasantly confused, unaware that it is his birthday and tells me he turns 43 today. Needs frequent VC to redirect to task at hand/encourage mobility. Tolerated gait distance about 119ft today then asked to return to his room, remains anxious about breathing even though vitals have always been WNL on room air with activity. Left sitting in chair with NT safety sitter present. As patient has been walking frequently in the hall with nursing staff, and cognition thus far is not improving to the point he could go home alone, reduced PT frequency. Continue to recommend SNF.     Follow Up Recommendations  SNF;Supervision/Assistance - 24 hour     Equipment Recommendations  None recommended by PT    Recommendations for Other Services       Precautions / Restrictions Precautions Precautions: Fall Precaution Comments: found down along side of road; h/o combativeness with dementia Restrictions Weight Bearing Restrictions: No    Mobility  Bed Mobility Overal bed mobility: Needs Assistance Bed Mobility: Supine to Sit;Sit to Supine     Supine to sit: Supervision Sit to supine: Min assist   General bed  mobility comments: verbal cues provided but able to perform supine to sit with min guard/increased time with HOB maximally elevated; did need MinA to return to bed for sequencing with BLEs    Transfers Overall transfer level: Needs assistance Equipment used: 1 person hand held assist Transfers: Sit to/from Stand Sit to Stand: Modified independent (Device/Increase time)         General transfer comment: Mod(I), no physical assist given  Ambulation/Gait Ambulation/Gait assistance: Min guard Gait Distance (Feet): 180 Feet Assistive device: 1 person hand held assist Gait Pattern/deviations: Step-through pattern;Decreased step length - right;Decreased step length - left;Shuffle;Trunk flexed Gait velocity: decreased   General Gait Details: flexed, forward posture with tendency to shuffle; at times needed light MinA for balance. Per NT sitter, he has been walking around unit already this morning with nursing staff   Stairs             Wheelchair Mobility    Modified Rankin (Stroke Patients Only)       Balance Overall balance assessment: Needs assistance Sitting-balance support: No upper extremity supported;Feet supported Sitting balance-Leahy Scale: Good     Standing balance support: No upper extremity supported;Single extremity supported;During functional activity Standing balance-Leahy Scale: Fair Standing balance comment: min guard for safety, occasional MinA                            Cognition Arousal/Alertness: Awake/alert Behavior During Therapy: Anxious;Flat affect Overall Cognitive Status: History of cognitive impairments - at baseline Area  of Impairment: Attention;Memory;Following commands;Safety/judgement;Awareness;Problem solving                   Current Attention Level: Sustained Memory: Decreased recall of precautions;Decreased short-term memory Following Commands: Follows one step commands with increased time;Follows one step  commands consistently Safety/Judgement: Decreased awareness of deficits;Decreased awareness of safety Awareness: Intellectual Problem Solving: Requires verbal cues;Requires tactile cues;Difficulty sequencing;Slow processing General Comments: flat affect, able to follow one step commands consistently; still remains anxious about breathing although vitals have always done well with activity on room air      Exercises      General Comments General comments (skin integrity, edema, etc.): continues to be anxious with breathing with mobility, but SPO2 has consistently been WNL with activity on room air in prior sessions; replaced O2 at EOS to help manage anxiety      Pertinent Vitals/Pain Pain Assessment: Faces Faces Pain Scale: No hurt Pain Intervention(s): Limited activity within patient's tolerance;Monitored during session    Home Living                      Prior Function            PT Goals (current goals can now be found in the care plan section) Acute Rehab PT Goals Patient Stated Goal: go home today PT Goal Formulation: With patient Time For Goal Achievement: 07/12/20 Potential to Achieve Goals: Good Progress towards PT goals: Progressing toward goals    Frequency    Min 2X/week      PT Plan Frequency needs to be updated;Discharge plan needs to be updated    Co-evaluation              AM-PAC PT "6 Clicks" Mobility   Outcome Measure  Help needed turning from your back to your side while in a flat bed without using bedrails?: None Help needed moving from lying on your back to sitting on the side of a flat bed without using bedrails?: None Help needed moving to and from a bed to a chair (including a wheelchair)?: None Help needed standing up from a chair using your arms (e.g., wheelchair or bedside chair)?: None Help needed to walk in hospital room?: A Little Help needed climbing 3-5 steps with a railing? : A Little 6 Click Score: 22    End of  Session Equipment Utilized During Treatment: Gait belt Activity Tolerance: Patient tolerated treatment well Patient left: in chair;with call bell/phone within reach;with nursing/sitter in room Nurse Communication: Mobility status PT Visit Diagnosis: Other abnormalities of gait and mobility (R26.89);Muscle weakness (generalized) (M62.81)     Time: 5027-7412 PT Time Calculation (min) (ACUTE ONLY): 13 min  Charges:  $Gait Training: 8-22 mins                     Windell Norfolk, DPT, PN1   Supplemental Physical Therapist [place]    Pager [phone] Acute Rehab Office [phone]

## 2020-07-07 NOTE — Care Management Important Message (Signed)
Important Message  Patient Details  Name: Patrick Phillips MRN: [id number] Date of Birth: 20-Mar-1940   Medicare Important Message Given:  Yes - Important Message mailed due to current National Emergency   Verbal consent obtained due to current National Emergency  Relationship to patient: Self Contact Name: [name] Date: 07/07/20  Time: 1159 Phone: [phone] Outcome: No Answer/Busy Important Message mailed to: Patient address on file    Delorse Lek 07/07/2020, 11:59 AM

## 2020-07-08 DIAGNOSIS — G9341 Metabolic encephalopathy: Secondary | ICD-10-CM | POA: Diagnosis not present

## 2020-07-08 LAB — GLUCOSE, CAPILLARY
Glucose-Capillary: 270 mg/dL — ABNORMAL HIGH (ref 70–99)
Glucose-Capillary: 176 mg/dL — ABNORMAL HIGH (ref 70–99)
Glucose-Capillary: 227 mg/dL — ABNORMAL HIGH (ref 70–99)

## 2020-07-08 NOTE — Progress Notes (Signed)
Occupational Therapy Treatment Patient Details Name: Patrick Phillips MRN: [id number] DOB: [birth year] Today's Date: 07/08/2020    History of present illness 80 y.o. male with presented with fever and confusion. Pt with dementia however still highly functional, took dog for a walk and dog came back without him. Pt later found lying on the side of road with no memory of what had happened but able to follow commands, taken to [facility] where MRI was negative for stroke and x-ray negative for fx. D/c home with daughter but pt with increasing confusion pt then brought  to [facility] ED 06/10/20. Pt with increasing confusion and agitation. Pt found to be febrile and COVID+. Admitted for acute metabolic encephalopathy.    PMHx of vascular dementia, prior CVA, DM-2, HTN   OT comments  Pt received sitting with staff at nurse's station and eager to complete hallway mobility. Pt able to complete multiple laps with light handheld assist and cues to attend to mobility to maintain balance. Pt able to demo transfers, bed mobility Independently but unable to focus attention enough to attend to ADL tasks today. DC recs remain appropriate, but will reduce OT frequency to 1x/wk due to slower progress secondary to cognitive deficits.    Follow Up Recommendations  SNF;Supervision/Assistance - 24 hour (vs memory care)    Equipment Recommendations  None recommended by OT    Recommendations for Other Services      Precautions / Restrictions Precautions Precautions: Fall Precaution Comments: found down along side of road; h/o combativeness with dementia Restrictions Weight Bearing Restrictions: No       Mobility Bed Mobility Overal bed mobility: Independent Bed Mobility: Supine to Sit;Sit to Supine           General bed mobility comments: Independently laid down in bed and then got right back up for more mobility    Transfers Overall transfer level: Independent Equipment used: 1 person hand held  assist;None Transfers: Sit to/from Stand Sit to Stand: Independent         General transfer comment: Independent with multiple sit to stands from various surfaces due to impulsivity and need for movement    Balance Overall balance assessment: Needs assistance Sitting-balance support: No upper extremity supported;Feet supported Sitting balance-Leahy Scale: Good     Standing balance support: No upper extremity supported;Single extremity supported;During functional activity Standing balance-Leahy Scale: Fair Standing balance comment: min guard for safety                           ADL either performed or assessed with clinical judgement   ADL Overall ADL's : Needs assistance/impaired                                     Functional mobility during ADLs: Min guard;Cueing for sequencing;Cueing for safety General ADL Comments: Unable to refocus attention to engage in ADL tasks. Pt perseverating on constantly moving - engaged in hallway mobility with light handheld assist. Cues needed to attend to mobility to maintain balance and cues for taking larger steps. Pt able to identify when loose pants falling off of waist, stop and pull them up     Vision   Vision Assessment?: No apparent visual deficits   Perception     Praxis      Cognition Arousal/Alertness: Awake/alert Behavior During Therapy: Flat affect Overall Cognitive Status: History of cognitive impairments - at  baseline Area of Impairment: Attention;Memory;Following commands;Safety/judgement;Awareness;Problem solving;Orientation                 Orientation Level: Disoriented to;Time;Place;Situation Current Attention Level: Sustained Memory: Decreased recall of precautions;Decreased short-term memory Following Commands: Follows one step commands with increased time;Follows one step commands consistently Safety/Judgement: Decreased awareness of deficits;Decreased awareness of  safety Awareness: Intellectual Problem Solving: Requires verbal cues;Requires tactile cues;Difficulty sequencing;Slow processing General Comments: flat affect, able to follow one step commands consistently. conversive with therapist though tangential and looking for family members with hallway mobility. decreased attention for tasks        Exercises     Shoulder Instructions       General Comments      Pertinent Vitals/ Pain       Pain Assessment: Faces Faces Pain Scale: Hurts a little bit Pain Location: back Pain Descriptors / Indicators: Sore Pain Intervention(s): Monitored during session;Limited activity within patient's tolerance  Home Living                                          Prior Functioning/Environment              Frequency  Min 1X/week        Progress Toward Goals  OT Goals(current goals can now be found in the care plan section)  Progress towards OT goals:  (variable progress due to cognition)  Acute Rehab OT Goals Patient Stated Goal: find my family OT Goal Formulation: With patient Time For Goal Achievement: 07/14/20 Potential to Achieve Goals: Fair ADL Goals Pt Will Perform Grooming: with supervision;standing Pt Will Perform Upper Body Bathing: with supervision;sitting;with caregiver independent in assisting Pt Will Perform Lower Body Bathing: with supervision;sitting/lateral leans;sit to/from stand;with caregiver independent in assisting Pt Will Perform Upper Body Dressing: with supervision;sitting Pt Will Perform Lower Body Dressing: with supervision;sitting/lateral leans;sit to/from stand;with caregiver independent in assisting Pt Will Transfer to Toilet: with supervision;ambulating Pt Will Perform Toileting - Clothing Manipulation and hygiene: with supervision;sitting/lateral leans;sit to/from stand;with caregiver independent in assisting Pt Will Perform Tub/Shower Transfer: with min guard assist;with  supervision;ambulating;with caregiver independent in assisting;shower seat  Plan Discharge plan remains appropriate;Frequency needs to be updated    Co-evaluation                 AM-PAC OT "6 Clicks" Daily Activity     Outcome Measure   Help from another person eating meals?: A Little Help from another person taking care of personal grooming?: A Little Help from another person toileting, which includes using toliet, bedpan, or urinal?: A Little Help from another person bathing (including washing, rinsing, drying)?: A Lot Help from another person to put on and taking off regular upper body clothing?: A Little Help from another person to put on and taking off regular lower body clothing?: A Lot 6 Click Score: 16    End of Session Equipment Utilized During Treatment: Gait belt  OT Visit Diagnosis: Unsteadiness on feet (R26.81);Other abnormalities of gait and mobility (R26.89);History of falling (Z91.81);Muscle weakness (generalized) (M62.81);Other symptoms and signs involving cognitive function   Activity Tolerance Patient tolerated treatment well   Patient Left Other (comment) (sitting in chair at nurse's station)   Nurse Communication Mobility status        Time: 6222-9798 OT Time Calculation (min): 33 min  Charges: OT General Charges $OT Visit: 1 Visit OT Treatments $Therapeutic Activity:  23-37 mins  Malachy Chamber, OTR/L Acute Rehab Services Office: [phone]   Patrick Phillips 07/08/2020, 2:34 PM

## 2020-07-08 NOTE — NC FL2 (Addendum)
[place] MEDICAID FL2 LEVEL OF CARE SCREENING TOOL     IDENTIFICATION  Patient Name: Patrick Phillips Birthdate: 10/21/1940 Sex: male Admission Date (Current Location): 06/10/2020  [facility] and [place] Number:  [employer] and Address:  [facility] [place], [place] [address], [place], [place] [address]      Provider Number: [id number]  Attending Physician Name and Address:  Jonetta Osgood, MD  Relative Name and Phone Number:  [name] (daughter) [phone]    Current Level of Care: Hospital Recommended Level of Care: Memory Care Prior Approval Number:    Date Approved/Denied:   PASRR Number:    Discharge Plan: Other (Comment) (Memory care)    Current Diagnoses:  Patient Active Problem List   Diagnosis Date Noted  .  Vascular dementia ([place])   06/30/2020  . Malnutrition of moderate degree   . Acute metabolic encephalopathy 85/27/7824  . COVID-19 virus infection 06/11/2020  . Acute ischemic stroke ([place]) 07/24/2017  . Chest pain 12/22/2015  . Chronic back pain 12/22/2015  . Syncope 10/17/2013  . Sinus bradycardia 10/17/2013  . SOB (shortness of breath) 10/17/2013  . Hypertension   . Diabetes mellitus without complication ([place])      Orientation RESPIRATION BLADDER Height & Weight     Self  Normal Continent,External catheter Weight: 190 lb 0.6 oz (86.2 kg) Height:  6' (182.9 cm)  BEHAVIORAL SYMPTOMS/MOOD NEUROLOGICAL BOWEL NUTRITION STATUS      Continent Diet (Mechanical soft)  AMBULATORY STATUS COMMUNICATION OF NEEDS Skin   Supervision, limited assistance Verbally Other (Comment) (MASD on anus and groin but no dressing)                       Personal Care Assistance Level of Assistance  Bathing,Feeding,Dressing Bathing Assistance: Limited assistance Feeding assistance: Independent Dressing Assistance: Limited assistance     Functional Limitations Info  Sight,Hearing,Speech Sight Info: Adequate Hearing Info: Adequate Speech  Info: Adequate    SPECIAL CARE FACTORS FREQUENCY  PT (By licensed PT),OT (By licensed OT)     PT Frequency: Home Health 2x/week OT Frequency: home health 2x/week            Contractures Contractures Info: Not present    Additional Factors Info  Code Status,Allergies,Psychotropic,Insulin Sliding Scale Code Status Info: FULL Allergies Info: Aricept (donepezil) Psychotropic Info: Depakote, Haldol Insulin Sliding Scale Info: See DC summary       Current Medications (07/08/2020):   Discharge Medications: STOP taking these medications       DRY EYES OP   escitalopram 20 MG tablet Commonly known as: LEXAPRO   glipiZIDE 5 MG tablet Commonly known as: GLUCOTROL   meloxicam 15 MG tablet Commonly known as: MOBIC   memantine 10 MG tablet Commonly known as: NAMENDA             TAKE these medications       amLODipine 5 MG tablet Commonly known as: NORVASC Take 2 tablets (10 mg total) by mouth daily. What changed:   how much to take  when to take this   aspirin EC 81 MG tablet Take 1 tablet (81 mg total) by mouth daily.   atorvastatin 40 MG tablet Commonly known as: LIPITOR Take 1 tablet (40 mg total) by mouth daily. What changed: Another medication with the same name was removed. Continue taking this medication, and follow the directions you see here.   carvedilol 6.25 MG tablet Commonly known as: COREG Take 1 tablet (6.25 mg  total) by mouth 2 (two) times daily with a meal.   cyanocobalamin 1000 MCG tablet Take 1 tablet (1,000 mcg total) by mouth daily.   divalproex 125 MG capsule Commonly known as: DEPAKOTE SPRINKLE Take 2 capsules (250 mg total) by mouth every 12 (twelve) hours.   esomeprazole 40 MG capsule Commonly known as: NEXIUM Take 1 capsule (40 mg total) by mouth daily.   feeding supplement Liqd Take 237 mLs by mouth 2 (two) times daily between meals.   fluticasone 50 MCG/ACT nasal spray Commonly known as: FLONASE Place 2  sprays into both nostrils daily.   hydrALAZINE 100 MG tablet Commonly known as: APRESOLINE Take 1 tablet (100 mg total) by mouth every 8 (eight) hours.   LORazepam 0.5 MG tablet Commonly known as: Ativan Take 1 tablet (0.5 mg total) by mouth every 8 (eight) hours as needed for anxiety or sedation.   metFORMIN 1000 MG tablet Commonly known as: GLUCOPHAGE Take 1 tablet (1,000 mg total) by mouth 2 (two) times daily.   multivitamin with minerals Tabs tablet Take 1 tablet by mouth daily.   polyethylene glycol 17 g packet Commonly known as: MIRALAX / GLYCOLAX Take 17 g by mouth daily as needed for mild constipation.   QUEtiapine 50 MG tablet Commonly known as: SEROQUEL Take 1 tablet (50 mg total) by mouth daily at 8 pm.   senna 8.6 MG Tabs tablet Commonly known as: SENOKOT Take 2 tablets (17.2 mg total) by mouth daily.   tamsulosin 0.4 MG Caps capsule Commonly known as: FLOMAX Take 1 capsule (0.4 mg total) by mouth daily.   thiamine 100 MG tablet Take 1 tablet (100 mg total) by mouth daily.      Relevant Imaging Results:  Relevant Lab Results:   Additional Information SSN: [id number]. COVID+ 4/15, out of isolation. Not fully vaccinated. Received Moderna vaccine on 07/14/20  Benard Halsted, LCSW

## 2020-07-08 NOTE — TOC Progression Note (Addendum)
Transition of Care [facility]) - Progression Note    Patient Details  Name: Patrick Phillips MRN: [id number] Date of Birth: 09/13/1940  Transition of Care [facility]) CM/SW [place], LCSW Phone Number: 07/08/2020, 9:34 AM  Clinical Narrative:    9:34am-CSW faxed to [facility] memory care and [facility]. CSW confirmed patient is still on [facility] waitlist.   10am-CSW received call from [place] at [facility]. She stated that she will review patient's financials and call CSW back.   Expected Discharge Plan: Memory Care Barriers to Discharge: Continued Medical Work up  Expected Discharge Plan and Services Expected Discharge Plan: Memory Care In-house Referral: Clinical Social Work Discharge Planning Services: CM Consult Post Acute Care Choice: Nursing Home Living arrangements for the past 2 months: Single Family Home Expected Discharge Date: 06/29/20                                     Social Determinants of Health (SDOH) Interventions    Readmission Risk Interventions No flowsheet data found.

## 2020-07-08 NOTE — Progress Notes (Signed)
PROGRESS NOTE                                                                                                                                                                                                             Patient Demographics:    Patrick Phillips, is a 80 y.o. male, DOB - [birth year], EYC:[id number]  Outpatient Primary MD for the patient is [name], MD   Admit date - 06/10/2020   LOS - 76  Chief Complaint  Patient presents with  . Altered Mental Status       Brief Narrative: Patient is a 80 y.o. male with PMHx of vascular dementia, prior CVA, DM-2, HTN-who presented with fever and confusion.  He was thought to have COVID-19 infection-and acute metabolic encephalopathy related to COVID-19 infection.  Hospital course complicated by persistent delirium.  See below for further details.  COVID-19 vaccinated status: Unknown  Significant Events: 4/14>> Admit to [facility] for fever/confusion  Significant studies: 4/14>>Chest x-ray: No PNA 4/14>> CT head: No acute intracranial abnormality. 4/14>> CT C-spine: No fracture 4/14>> MRI brain: No acute infarct. 4/15>> chest x-ray: No PNA 4/15>> CSF: WBC 1 (tube #4), protein 46, glucose 54 4/16>> EEG: Mild to moderate diffuse encephalopathy-no seizures.  COVID-19 medications: Remdesivir: 4/15>>4/17  Antibiotics: Vancomycin: 4/14 x1 Ceftriaxone: 4/14 x 1 Ampicillin: 4/14 x 1 Doxy: 4/22>> 4/26   Microbiology data: 4/14 >>blood culture: No growth 4/14>> urine culture: No growth 4/15>> blood culture: No growth 4/15>> urine culture: Multiple species 4/15>> CSF culture: No growth 4/15>> CSF PCR HSV 1/2: Negative. 4/15>> CSF HSV culture: Negative 4/15>> CSF arbovirus IgG: Negative 4/15>> CSF VDRL: Negative RMSF CSF serology IgM -ve  Procedures:  4/15>> lumbar puncture in the emergency room  Consults:  Neurology, psychiatry, Pall.Care  DVT prophylaxis:   enoxaparin (LOVENOX) injection 40 mg Start: 06/11/20 0600    Subjective:   Remains pleasantly confused.   Assessment  & Plan :   Acute metabolic encephalopathy-superimposed on underlying Dementia with delirium: Initially encephalopathy was due to COVID-19 infection-however lately his altered mental status is more consistent with delirium related to dementia.  Neurology/psychiatry consulted-however he continues to have waxing  and waning delirium.  Currently remains stable on Seroquel and Depakote.  Awaiting Geri psych/SNF placement.  ? Tick Bite: 2 ticks found by nursing staff on patient 06/17/20 - RMSF IgM - ve, DW ID Dr Tommy Medal 06/23/20- not acute, no Rx.  AKI: Mild-likely hemodynamically mediated-resolved with IV fluids.  Dysphagia:-Patient followed closely by SLP, had MBS done 5/8, previously on dysphagia 3 diet-prior MD discussed with family-has been upgraded to a regular diet.  Borderline vitamin B12 deficiency: On supplementation.  Hypokalemia: Repleted-recheck periodically.  HTN: BP stable on Coreg, Norvasc, and hydralazine.  History of CVA: MRI brain negative x2-on aspirin/statin.  Dementia with delirium: See above  Moderate protein calorie malnutrition  GERD: On PPI  SIRS-COVID-19 infection: Completed Remdesivir x3 days.  Not hypoxic-COVID-19 isolation completed on 4/25.   No results for input(s): DDIMER, FERRITIN, LDH, CRP in the last 72 hours.  Lab Results  Component Value Date   [place] (A) 06/11/2020   [place] NEGATIVE 01/13/2020   DM-2: CBGs relatively stable-continue SSI-oral hypoglycemic agents on hold  Recent Labs    07/07/20 2055 07/08/20 0753 07/08/20 1204  GLUCAP 181* 270* 176*     Condition -   Guarded  Family Communication  : None at bedside  Code Status :  DNR  Diet :  Diet Order            Diet regular Room service appropriate? No; Fluid consistency: Thin  Diet effective now                  Disposition Plan  :    Status is: Inpatient  Remains inpatient appropriate because:Inpatient level of care appropriate due to severity of illness   Dispo: The patient is from: Home              Anticipated d/c is to: SNF              Patient currently is medically stable to d/c. awaiting SNF bed availability   Difficult to place patient No   Barriers to discharge: Awaiting SNF/Geri psych bed.  Antimicorbials  :    Anti-infectives (From admission, onward)   Start     Dose/Rate Route Frequency Ordered Stop   06/18/20 1745  doxycycline (VIBRAMYCIN) 100 mg in sodium chloride 0.9 % 250 mL IVPB  Status:  Discontinued        100 mg 125 mL/hr over 120 Minutes Intravenous Every 12 hours 06/18/20 1648 06/23/20 0847   06/12/20 1000  remdesivir 100 mg in sodium chloride 0.9 % 100 mL IVPB        100 mg 200 mL/hr over 30 Minutes Intravenous Daily 06/11/20 0435 06/13/20 0900   06/11/20 0445  remdesivir 200 mg in sodium chloride 0.9% 250 mL IVPB        200 mg 580 mL/hr over 30 Minutes Intravenous Once 06/11/20 0434 06/11/20 0929   06/11/20 0230  vancomycin (VANCOREADY) IVPB 1500 mg/300 mL        1,500 mg 150 mL/hr over 120 Minutes Intravenous  Once 06/11/20 0229 06/11/20 0812   06/11/20 0215  ampicillin (OMNIPEN) 2 g in sodium chloride 0.9 % 100 mL IVPB        2 g 300 mL/hr over 20 Minutes Intravenous  Once 06/11/20 0157 06/11/20 0550   06/11/20 0215  vancomycin (VANCOREADY) IVPB 1000 mg/200 mL  Status:  Discontinued        1,000 mg 200 mL/hr over 60 Minutes Intravenous  Once 06/11/20 0157 06/11/20 0229  06/11/20 0200  cefTRIAXone (ROCEPHIN) 2 g in sodium chloride 0.9 % 100 mL IVPB        2 g 200 mL/hr over 30 Minutes Intravenous  Once 06/11/20 0157 06/11/20 0248      Inpatient Medications  Scheduled Meds: . amLODipine  10 mg Oral Daily  . aspirin EC  81 mg Oral Daily  . atorvastatin  40 mg Oral Daily  . carvedilol  6.25 mg Oral BID WC  . divalproex  250 mg Oral Q12H  . enoxaparin (LOVENOX) injection   40 mg Subcutaneous Q24H  . feeding supplement  237 mL Oral BID BM  . fluticasone  2 spray Each Nare Daily  . hydrALAZINE  50 mg Oral Q8H  . insulin aspart  0-9 Units Subcutaneous TID WC  . LORazepam  0.5 mg Intravenous Once  . multivitamin with minerals  1 tablet Oral Daily  . naLOXone (NARCAN)  injection  0.4 mg Intravenous Once  . pantoprazole  40 mg Oral Daily  . QUEtiapine  50 mg Oral Q2000  . senna  2 tablet Oral Daily  . tamsulosin  0.4 mg Oral Daily  . thiamine  100 mg Oral Daily  . vitamin B-12  1,000 mcg Oral Daily   Continuous Infusions:  PRN Meds:.acetaminophen, haloperidol lactate, hydrALAZINE, [DISCONTINUED] ondansetron **OR** ondansetron (ZOFRAN) IV, sodium chloride   See all Orders from today for further details   Oren Binet M.D on 07/08/2020 at 12:21 PM  To page go to [email or web address] - use universal password  [facility] -  Office  [phone]    Objective:   Vitals:   07/07/20 0527 07/07/20 1110 07/07/20 1402 07/07/20 2100  BP: (!) 150/42  (!) 159/63 (!) 152/60  Pulse: 71  82 87  Resp: 18  20 17   Temp: 98.2 F (36.8 C)   98 F (36.7 C)  TempSrc: Oral   Axillary  SpO2: 98% 99% 99% 99%  Weight:      Height:        Wt Readings from Last 3 Encounters:  06/10/20 86.2 kg  06/10/20 86.2 kg  04/19/20 86.2 kg     Intake/Output Summary (Last 24 hours) at 07/08/2020 1221 Last data filed at 07/08/2020 0900 Gross per 24 hour  Intake 240 ml  Output 770 ml  Net -530 ml     Physical Exam Gen Exam: Pleasantly confused HEENT:atraumatic, normocephalic Chest: B/L clear to auscultation anteriorly CVS:S1S2 regular Abdomen:soft non tender, non distended Extremities:no edema Neurology: Non focal Skin: no rash     Data Review:    CBC Recent Labs  Lab 07/02/20 0655  WBC 10.8*  HGB 12.5*  HCT 37.8*  PLT 254  MCV 85.1  MCH 28.2  MCHC 33.1  RDW 13.6    Chemistries  Recent Labs  Lab 07/02/20 0655  NA 137  K 3.9  CL 106  CO2  24  GLUCOSE 164*  BUN 14  CREATININE 0.93  CALCIUM 9.2   ------------------------------------------------------------------------------------------------------------------ No results for input(s): CHOL, HDL, LDLCALC, TRIG, CHOLHDL, LDLDIRECT in the last 72 hours.  Lab Results  Component Value Date   HGBA1C 6.6 (H) 06/11/2020   ------------------------------------------------------------------------------------------------------------------ No results for input(s): TSH, T4TOTAL, T3FREE, THYROIDAB in the last 72 hours.  Invalid input(s): FREET3 ------------------------------------------------------------------------------------------------------------------ No results for input(s): VITAMINB12, FOLATE, FERRITIN, TIBC, IRON, RETICCTPCT in the last 72 hours.  Coagulation profile No results for input(s): INR, PROTIME in the last 168 hours.  No results for input(s): DDIMER in the last 72 hours.  Cardiac Enzymes No results for input(s): CKMB, TROPONINI, MYOGLOBIN in the last 168 hours.  Invalid input(s): CK ------------------------------------------------------------------------------------------------------------------    Component Value Date/Time   BNP 39.4 06/28/2020 0044    Micro Results No results found for this or any previous visit (from the past 240 hour(s)).  Radiology Reports CT Head Wo Contrast  Result Date: 06/10/2020 CLINICAL DATA:  Head and C-spine injury, fall, altered mental status EXAM: CT HEAD WITHOUT CONTRAST CT CERVICAL SPINE WITHOUT CONTRAST TECHNIQUE: Multidetector CT imaging of the head and cervical spine was performed following the standard protocol without intravenous contrast. Multiplanar CT image reconstructions of the cervical spine were also generated. COMPARISON:  05/04/2020 FINDINGS: CT HEAD FINDINGS Brain: Stable mild brain atrophy pattern. No acute intracranial hemorrhage, new mass lesion, acute infarction, midline shift, herniation, hydrocephalus, or  extra-axial fluid collection. No focal mass effect or edema. Cisterns are patent. Cerebellar atrophy as well. Vascular: Intracranial atherosclerosis at the skull base. No hyperdense vessel. Skull: Normal. Negative for fracture or focal lesion. Sinuses/Orbits: Minor scattered sinus mucosal thickening as before. Orbits are symmetric. No acute finding. Other: None. CT CERVICAL SPINE FINDINGS Alignment: Normal. Skull base and vertebrae: No acute fracture. No primary bone lesion or focal pathologic process. Soft tissues and spinal canal: Normal prevertebral soft tissues. No hemorrhage or hematoma appreciated. Carotid atherosclerosis. Disc levels: Similar mild multilevel degenerative disc disease and facet arthropathy. Facets are aligned. No subluxation or dislocation. Preserved vertebral body heights. Upper chest: Negative. Other: None. IMPRESSION: Stable brain atrophy pattern. No acute intracranial abnormality or interval change by noncontrast CT. Stable mild cervical degenerative changes as above. No acute osseous finding or malalignment by CT. Electronically Signed   By: Jerilynn Mages.  Shick M.D.   On: 06/10/2020 12:31   CT Cervical Spine Wo Contrast  Result Date: 06/10/2020 CLINICAL DATA:  Head and C-spine injury, fall, altered mental status EXAM: CT HEAD WITHOUT CONTRAST CT CERVICAL SPINE WITHOUT CONTRAST TECHNIQUE: Multidetector CT imaging of the head and cervical spine was performed following the standard protocol without intravenous contrast. Multiplanar CT image reconstructions of the cervical spine were also generated. COMPARISON:  05/04/2020 FINDINGS: CT HEAD FINDINGS Brain: Stable mild brain atrophy pattern. No acute intracranial hemorrhage, new mass lesion, acute infarction, midline shift, herniation, hydrocephalus, or extra-axial fluid collection. No focal mass effect or edema. Cisterns are patent. Cerebellar atrophy as well. Vascular: Intracranial atherosclerosis at the skull base. No hyperdense vessel. Skull:  Normal. Negative for fracture or focal lesion. Sinuses/Orbits: Minor scattered sinus mucosal thickening as before. Orbits are symmetric. No acute finding. Other: None. CT CERVICAL SPINE FINDINGS Alignment: Normal. Skull base and vertebrae: No acute fracture. No primary bone lesion or focal pathologic process. Soft tissues and spinal canal: Normal prevertebral soft tissues. No hemorrhage or hematoma appreciated. Carotid atherosclerosis. Disc levels: Similar mild multilevel degenerative disc disease and facet arthropathy. Facets are aligned. No subluxation or dislocation. Preserved vertebral body heights. Upper chest: Negative. Other: None. IMPRESSION: Stable brain atrophy pattern. No acute intracranial abnormality or interval change by noncontrast CT. Stable mild cervical degenerative changes as above. No acute osseous finding or malalignment by CT. Electronically Signed   By: Jerilynn Mages.  Shick M.D.   On: 06/10/2020 12:31   MR BRAIN WO CONTRAST  Result Date: 06/29/2020 CLINICAL DATA:  Right-sided facial drooping, altered mental status. EXAM: MRI HEAD WITHOUT CONTRAST TECHNIQUE: Multiplanar, multiecho pulse sequences of the brain and surrounding structures were obtained without intravenous contrast. COMPARISON:  June 10, 2020. FINDINGS: Brain: No acute infarction, hemorrhage, hydrocephalus, extra-axial collection  or mass lesion. Dilated perivascular space in inferior right basal ganglia. No substantial white matter disease for patient age. Vascular: Partial loss of flow void in the distal left vertebral artery, which could be due to stenosis. Normal flow void in the visualized proximal aICAs. Skull and upper cervical spine: Normal marrow signal. Sinuses/Orbits: Mild mucosal thickening of the frontal sinuses and moderate mucosal thickening of scattered ethmoid air cells. Inferior left maxillary sinus mucous retention cyst. Bilateral exopthalmos, similar to prior. Other: No mastoid effusions. IMPRESSION: 1. No evidence of  acute abnormality. 2. Partial loss of flow void in the distal left vertebral artery, similar to prior and possibly due to stenosis. Consider CT angio head and neck to further evaluate. 3. Frontoethmoidal paranasal sinus mucosal thickening. Electronically Signed   By: Margaretha Sheffield MD   On: 06/29/2020 07:47   MR Brain Wo Contrast (neuro protocol)  Result Date: 06/10/2020 CLINICAL DATA:  Acute neuro deficit.  Mental status change. EXAM: MRI HEAD WITHOUT CONTRAST TECHNIQUE: Multiplanar, multiecho pulse sequences of the brain and surrounding structures were obtained without intravenous contrast. COMPARISON:  CT head 06/10/2020 FINDINGS: Brain: Ventricle size and cerebral volume normal for age. Negative for acute infarct. No significant chronic ischemic change. Negative for hemorrhage or mass. Image quality degraded by mild motion. Vascular: Partial loss of flow void in the left internal carotid artery through the skull base and the distal left vertebral artery which may be due to slow flow or stenosis. Skull and upper cervical spine: Negative Sinuses/Orbits: Mild mucosal edema paranasal sinuses. No orbital mass. Bilateral exophthalmos. Other: None IMPRESSION: Negative for acute infarct. Partial loss of flow void in the left internal carotid artery distal left vertebral artery which could be due to proximal stenosis. Consider CT angio head and neck for further evaluation. Electronically Signed   By: Franchot Gallo M.D.   On: 06/10/2020 15:09   DG CHEST PORT 1 VIEW  Result Date: 07/04/2020 CLINICAL DATA:  Dyspnea EXAM: PORTABLE CHEST 1 VIEW COMPARISON:  06/25/2020 FINDINGS: Cardiac shadow is within normal limits. The lungs are clear bilaterally. Loop recorder is noted on the left. No bony abnormality is seen. IMPRESSION: No acute abnormality noted. Electronically Signed   By: Inez Catalina M.D.   On: 07/04/2020 21:41   DG Chest Port 1 View  Result Date: 06/25/2020 CLINICAL DATA:  Shortness of breath and  lethargy. EXAM: PORTABLE CHEST 1 VIEW COMPARISON:  Chest x-ray 06/24/2020 FINDINGS: The cardiac silhouette, mediastinal and hilar contours are within limits. Stable mild tortuosity and calcification the thoracic aorta. The lungs are clear of an acute process. No pleural effusions or pulmonary lesions. The bony thorax is intact. IMPRESSION: No acute cardiopulmonary findings. Electronically Signed   By: Marijo Sanes M.D.   On: 06/25/2020 14:15   DG Chest Port 1 View  Result Date: 06/24/2020 CLINICAL DATA:  Shortness of breath. EXAM: PORTABLE CHEST 1 VIEW COMPARISON:  June 20, 2020. FINDINGS: The heart size and mediastinal contours are within normal limits. Both lungs are clear. The visualized skeletal structures are unremarkable. IMPRESSION: No active disease. Electronically Signed   By: Marijo Conception M.D.   On: 06/24/2020 08:13   DG Chest Port 1 View  Result Date: 06/20/2020 CLINICAL DATA:  Shortness of breath EXAM: PORTABLE CHEST 1 VIEW COMPARISON:  06/11/2020 FINDINGS: Loop recorder is identified adjacent to the cardiac apex. Normal heart size. No pleural effusion or edema. No airspace densities identified. No acute osseous findings. IMPRESSION: No acute cardiopulmonary abnormalities. Electronically Signed  By: Kerby Moors M.D.   On: 06/20/2020 08:34   DG Chest Port 1 View  Result Date: 06/11/2020 CLINICAL DATA:  Questionable sepsis. EXAM: PORTABLE CHEST 1 VIEW COMPARISON:  June 10, 2020 FINDINGS: There is no evidence of acute infiltrate, pleural effusion or pneumothorax. The heart size and mediastinal contours are within normal limits. A radiopaque loop recorder device is seen. The visualized skeletal structures are unremarkable. IMPRESSION: No active disease. Electronically Signed   By: Virgina Norfolk M.D.   On: 06/11/2020 03:10   DG Chest Port 1 View  Result Date: 06/10/2020 CLINICAL DATA:  Altered mental status. EXAM: PORTABLE CHEST 1 VIEW COMPARISON:  May 25, 2020 FINDINGS: Loop  recorder in place. Cardiomediastinal silhouette is normal. Mediastinal contours appear intact. Tortuosity and calcific atherosclerotic disease of the aorta. There is no evidence of focal airspace consolidation, pleural effusion or pneumothorax. Osseous structures are without acute abnormality. Soft tissues are grossly normal. IMPRESSION: No active disease. Electronically Signed   By: Fidela Salisbury M.D.   On: 06/10/2020 11:15   DG Swallowing Func-Speech Pathology  Result Date: 07/02/2020 Objective Swallowing Evaluation: Type of Study: Bedside Swallow Evaluation  Patient Details Name: Dhylan Wenning MRN: [id number] Date of Birth: [birth year] Today's Date: 07/02/2020 Time: SLP Start Time (ACUTE ONLY): 64 -SLP Stop Time (ACUTE ONLY): 1249 SLP Time Calculation (min) (ACUTE ONLY): 19 min Past Medical History: Past Medical History: Diagnosis Date . Cancer ([place])   skin cancer . Dementia ([place])  . Diabetes mellitus without complication ([place])  . Hypertension  . Memory deficit  Past Surgical History: Past Surgical History: Procedure Laterality Date . BACK SURGERY   . HERNIA REPAIR   . LOOP RECORDER INSERTION N/A 07/26/2017  Procedure: LOOP RECORDER INSERTION;  Surgeon: Evans Lance, MD;  Location: [place] [facility];  Service: Cardiovascular;  Laterality: N/A; . TEE WITHOUT CARDIOVERSION N/A 07/26/2017  Procedure: TRANSESOPHAGEAL ECHOCARDIOGRAM (TEE);  Surgeon: Fay Records, MD;  Location[facility] ENDOSCOPY;  Service: Cardiovascular;  Laterality: N/A; . TRIGGER FINGER RELEASE Right 12/02/2015  Procedure: RIGHT THUMB RELEASE TRIGGER FINGER/A-1 PULLEY;  Surgeon: Leanora Cover, MD;  Location: [place];  Service: Orthopedics;  Laterality: Right; HPI: Pt is a 80 y.o. male with PMHx of vascular dementia, CVA, DM-2, HTN. On 4/14, pt took his dog for a walk and the dog returned home without him. Pt was found lying on the side of road with no memory of what had happened but able to follow commands. Pt was taken to [facility] and  d/c home with daughter following negative workup, but pt was brough to [facility] ED due to increased confusion. Pt found to be febrile and COVID+ and admitted for acute metabolic encephalopathy. MRI brain 4/14 negative for acute changes. CXR 4/15 negative for active disease. EEG 4/16: mild to moderate diffuse encephalopathy, nonspecific etiology. No seizures or epileptiform discharges. Psych consulted due to severe delirium.  No data recorded Assessment / Plan / Recommendation CHL IP CLINICAL IMPRESSIONS 07/02/2020 Clinical Impression Pt presented with oropharyngeal dysphagia characterized by impaired bolus control and a pharyngeal delay. He demonstrated premature spillage to the pyriform sinuses, penetration (PAS 5) and aspiration (PAS 7, 8) of thin liquids via cup and straw. Pt was unable to demonstrate compensatory strategies despite verbal prompts and cues and aspiration persisted despite use of 5cc and 10cc boluses. Laryngeal invasion was improved to PAS 2 with nectar thick liquids which is considered WNL. Aspiration resulted in an ineffective cough on two occasions, but all other instances were  silent. Per EMR, pt has required augmentative IV fluids every few days due to poor p.o. intake, and SLP anticipates that use of nectar thick liquids to eliminate aspiration would likely not improve this. It is anticipated that the pt has had intermittent aspiration of thin liquids throughout this 21-day admission. Pt's daughter, [name], was contacted via phone and she reported that the pt had also been inconsistently symptomic of aspiration prior to admission so his aspiration may be chronic issue. Pt is ambulatory, afebrile, and all five of his CXRs have been negative for active disease. Pt's case was discussed with Dr. Waldron Labs including potential options for dysphagia management considering dehydration risk vs pulmonary risk. Considering his tolerance of intermittent aspiration thus far, it was agreed that his current diet  will be continued with strict observance of swallowing precautions, consistent pt mobilization, and consistent oral care. Pt's daughter, [name], was educated regarding the pt's performance, concerns regarding potential for dehydration due to avoidance of liquids, and the potential progression of dysphagia in the setting of dementia. All of her questions were answered and she verbalized agreement with plan of care. SLP Visit Diagnosis Dysphagia, oropharyngeal phase (R13.12) Attention and concentration deficit following -- Frontal lobe and executive function deficit following -- Impact on safety and function Mild aspiration risk;Moderate aspiration risk   CHL IP TREATMENT RECOMMENDATION 07/02/2020 Treatment Recommendations Therapy as outlined in treatment plan below   Prognosis 07/02/2020 Prognosis for Safe Diet Advancement Fair Barriers to Reach Goals Cognitive deficits Barriers/Prognosis Comment -- CHL IP DIET RECOMMENDATION 07/02/2020 SLP Diet Recommendations Dysphagia 3 (Mech soft) solids;Thin liquid Liquid Administration via Cup;No straw Medication Administration Whole meds with puree Compensations Minimize environmental distractions;Slow rate;Small sips/bites Postural Changes Seated upright at 90 degrees   CHL IP OTHER RECOMMENDATIONS 07/02/2020 Recommended Consults -- Oral Care Recommendations Oral care BID;Staff/trained caregiver to provide oral care Other Recommendations --   CHL IP FOLLOW UP RECOMMENDATIONS 07/02/2020 Follow up Recommendations Skilled Nursing facility   [facility] IP FREQUENCY AND DURATION 07/02/2020 Speech Therapy Frequency (ACUTE ONLY) min 1 x/week Treatment Duration 2 weeks      CHL IP ORAL PHASE 07/02/2020 Oral Phase Impaired Oral - Pudding Teaspoon -- Oral - Pudding Cup -- Oral - Honey Teaspoon -- Oral - Honey Cup -- Oral - Nectar Teaspoon -- Oral - Nectar Cup -- Oral - Nectar Straw Premature spillage;Decreased bolus cohesion Oral - Thin Teaspoon -- Oral - Thin Cup Premature spillage;Decreased bolus  cohesion Oral - Thin Straw Premature spillage;Decreased bolus cohesion Oral - Puree Decreased bolus cohesion;Premature spillage Oral - Mech Soft -- Oral - Regular WFL Oral - Multi-Consistency -- Oral - Pill Decreased bolus cohesion;Premature spillage Oral Phase - Comment --  CHL IP PHARYNGEAL PHASE 07/02/2020 Pharyngeal Phase Impaired Pharyngeal- Pudding Teaspoon -- Pharyngeal -- Pharyngeal- Pudding Cup -- Pharyngeal -- Pharyngeal- Honey Teaspoon -- Pharyngeal -- Pharyngeal- Honey Cup -- Pharyngeal -- Pharyngeal- Nectar Teaspoon -- Pharyngeal -- Pharyngeal- Nectar Cup Delayed swallow initiation-vallecula Pharyngeal -- Pharyngeal- Nectar Straw Delayed swallow initiation-vallecula;Delayed swallow initiation-pyriform sinuses;Penetration/Aspiration during swallow Pharyngeal Material enters airway, remains ABOVE vocal cords then ejected out Pharyngeal- Thin Teaspoon -- Pharyngeal -- Pharyngeal- Thin Cup Delayed swallow initiation-pyriform sinuses;Penetration/Aspiration during swallow;Penetration/Apiration after swallow Pharyngeal Material enters airway, CONTACTS cords and not ejected out;Material enters airway, passes BELOW cords and not ejected out despite cough attempt by patient;Material enters airway, passes BELOW cords without attempt by patient to eject out (silent aspiration) Pharyngeal- Thin Straw Delayed swallow initiation-pyriform sinuses;Penetration/Aspiration during swallow;Penetration/Apiration after swallow Pharyngeal Material enters airway, CONTACTS cords  and not ejected out;Material enters airway, passes BELOW cords without attempt by patient to eject out (silent aspiration);Material enters airway, passes BELOW cords and not ejected out despite cough attempt by patient Pharyngeal- Puree Delayed swallow initiation-vallecula Pharyngeal -- Pharyngeal- Mechanical Soft -- Pharyngeal -- Pharyngeal- Regular Delayed swallow initiation-vallecula Pharyngeal -- Pharyngeal- Multi-consistency -- Pharyngeal --  Pharyngeal- Pill Delayed swallow initiation-vallecula Pharyngeal -- Pharyngeal Comment --  CH[facility] IP CERVICAL ESOPHAGEAL PHASE 07/02/2020 Cervical Esophageal Phase WFL Pudding Teaspoon -- Pudding Cup -- Honey Teaspoon -- Honey Cup -- Nectar Teaspoon -- Nectar Cup -- Nectar Straw -- Thin Teaspoon -- Thin Cup -- Thin Straw -- Puree -- Mechanical Soft -- Regular -- Multi-consistency -- Pill -- Cervical Esophageal Comment -- Shanika I. Hardin Negus, [place], [place] Office number [phone] Pager [phone] Horton Marshall 07/02/2020, 3:23 PM              EEG adult  Result Date: 06/29/2020 Lora Havens, MD     06/29/2020  1:23 PM Patient Name: Patrick Phillips MRN: [id number] Epilepsy Attending: Lora Havens Referring Physician/Provider: Dr Amie [place] Date: 06/29/2020 Duration: 21.43 mins Patient history: 80 year old male with waxing and waning mental status.  EEG to evaluate for seizures. Level of alertness: Awake AEDs during EEG study: Depakote Technical aspects: This EEG study was done with scalp electrodes positioned according to the 10-20 International system of electrode placement. Electrical activity was acquired at a sampling rate of 500Hz  and reviewed with a high frequency filter of 70Hz  and a low frequency filter of 1Hz . EEG data were recorded continuously and digitally stored. Description: The posterior dominant rhythm consists of 8-9 Hz activity of moderate voltage (25-35 uV) seen predominantly in posterior head regions, symmetric and reactive to eye opening and eye closing. EEG showed intermittent generalized 5 to 6 Hz theta slowing. Hyperventilation and photic stimulation were not performed.   ABNORMALITY - Intermittent slow, generalized IMPRESSION: This study is suggestive of mild diffuse encephalopathy, nonspecific etiology. No seizures or epileptiform discharges were seen throughout the recording. Lora Havens   EEG adult  Result Date: 06/12/2020 Lora Havens, MD      06/12/2020  3:07 PM Patient Name: Trinity Huyler MRN: [id number] Epilepsy Attending: Lora Havens Referring Physician/Provider: Dr Oren Binet Date: 06/12/2020 Duration: 25.42 mins Patient history: 80yo M with ams. EEG to evaluate for seizure Level of alertness: Awake AEDs during EEG study: None Technical aspects: This EEG study was done with scalp electrodes positioned according to the 10-20 International system of electrode placement. Electrical activity was acquired at a sampling rate of 500Hz  and reviewed with a high frequency filter of 70Hz  and a low frequency filter of 1Hz . EEG data were recorded continuously and digitally stored. Description: No posterior dominant rhythm was seen. EEG showed continuous generalized 5 to 6 Hz theta slowing. Hyperventilation and photic stimulation were not performed.   ABNORMALITY - Continuous slow, generalized IMPRESSION: This study is suggestive of mild to moderate diffuse encephalopathy, nonspecific etiology. No seizures or epileptiform discharges were seen throughout the recording. Priyanka Barbra Sarks   DG Hips Bilat W or Wo Pelvis 3-4 Views  Result Date: 06/10/2020 CLINICAL DATA:  Found on side of road, last seen normal at 2130 hours last night, fall, dementia, tenderness of hips bilaterally EXAM: DG HIP (WITH OR WITHOUT PELVIS) 3-4V BILAT COMPARISON:  None FINDINGS: Osseous demineralization. Hip and SI joint spaces preserved. No acute fracture, dislocation, or bone destruction. Mild degenerative disc disease changes at visualized lower lumbar spine.  IMPRESSION: No acute osseous abnormalities. Electronically Signed   By: Lavonia Dana M.D.   On: 06/10/2020 11:48   CT HEAD CODE STROKE WO CONTRAST`  Result Date: 06/28/2020 CLINICAL DATA:  Code stroke.  Confusion.  Altered mental status. EXAM: CT HEAD WITHOUT CONTRAST TECHNIQUE: Contiguous axial images were obtained from the base of the skull through the vertex without intravenous contrast. COMPARISON:  MRI 06/10/2020  FINDINGS: Brain: Age related volume loss. No evidence of old or acute small or large vessel infarction. No mass lesion, hemorrhage, hydrocephalus or extra-axial collection. Dilated perivascular space at the base of the brain. Vascular: There is atherosclerotic calcification of the major vessels at the base of the brain. Skull: Negative Sinuses/Orbits: Clear/normal Other: None ASPECTS (Nixon Stroke Program Early CT Score) - Ganglionic level infarction (caudate, lentiform nuclei, internal capsule, insula, M1-M3 cortex): 7 - Supraganglionic infarction (M4-M6 cortex): 3 Total score (0-10 with 10 being normal): 10 IMPRESSION: 1. Normal head CT for age. Atherosclerotic calcification of the major vessels at base of the brain. 2. ASPECTS is 10 3. These results were communicated to Dr. Rory Percy at 7:18 pmon 5/2/2022by text page via the [facility] messaging system. Electronically Signed   By: Nelson Chimes M.D.   On: 06/28/2020 19:19

## 2020-07-08 NOTE — Progress Notes (Signed)
  Speech Language Pathology Treatment: Dysphagia  Patient Details Name: Patrick Phillips MRN: [id number] DOB: 1940/09/07 Today's Date: 07/08/2020 Time: 1000-1015 SLP Time Calculation (min) (ACUTE ONLY): 15 min  Assessment / Plan / Recommendation Clinical Impression  Pt was seen for dysphagia treatment. He was alert and cooperative during the session, but remains confused. Pt was unable to complete dysphagia exercises due to difficulty following commands. No s/sx of aspiration were observed noted with solids and a single cough was demonstrated with thin liquids via cup. Reduced laryngeal sensation was noted during the last swallow study and the absence of coughing is therefore not consistently indicative of adequate airway protection with this pt. Pt is currently on dysphagia 3 solids and thin liquids with known aspiration risk and strict observance of swallowing precautions to reduce risks. Further acute skilled SLP services are not clinically indicated at this time. However, SLP will be happy to see pt again if clinically indicated and dysphagia treatment is recommended at the next level of care if pt's mentation improves and he is able to participate.    HPI HPI: Pt is a 80 y.o. male with PMHx of vascular dementia, CVA, DM-2, HTN. On 4/14, pt took his dog for a walk and the dog returned home without him. Pt was found lying on the side of road with no memory of what had happened but able to follow commands. Pt was taken to [facility] and d/c home with daughter following negative workup, but pt was brough to [facility] ED due to increased confusion. Pt found to be febrile and COVID+ and admitted for acute metabolic encephalopathy. MRI brain 4/14 negative for acute changes. CXR 4/15 negative for active disease. EEG 4/16: mild to moderate diffuse encephalopathy, nonspecific etiology. No seizures or epileptiform discharges. Psych consulted due to severe delirium. CXR 5/8: No acute abnormality noted.      SLP Plan  All goals  met;Discharge SLP treatment due to (comment)       Recommendations  Diet recommendations: Dysphagia 3 (mechanical soft);Thin liquid Liquids provided via: Cup;No straw Medication Administration: Whole meds with puree Supervision: Staff to assist with self feeding Compensations: Minimize environmental distractions;Slow rate;Small sips/bites Postural Changes and/or Swallow Maneuvers: Seated upright 90 degrees                Oral Care Recommendations: Oral care BID Follow up Recommendations: Skilled Nursing facility SLP Visit Diagnosis: Dysphagia, unspecified (R13.10) Plan: All goals met;Discharge SLP treatment due to (comment)       Anisah Kuck I. Hardin Negus, [place], [place] Office number [phone] Pager [phone]                Horton Marshall 07/08/2020, 11:03 AM

## 2020-07-09 DIAGNOSIS — G9341 Metabolic encephalopathy: Secondary | ICD-10-CM | POA: Diagnosis not present

## 2020-07-09 LAB — GLUCOSE, CAPILLARY
Glucose-Capillary: 210 mg/dL — ABNORMAL HIGH (ref 70–99)
Glucose-Capillary: 196 mg/dL — ABNORMAL HIGH (ref 70–99)
Glucose-Capillary: 163 mg/dL — ABNORMAL HIGH (ref 70–99)
Glucose-Capillary: 201 mg/dL — ABNORMAL HIGH (ref 70–99)

## 2020-07-09 MED ORDER — QUETIAPINE FUMARATE 25 MG PO TABS
25.0000 mg | ORAL_TABLET | Freq: Once | ORAL | Status: AC
  Administered 2020-07-09: 25 mg via ORAL
  Filled 2020-07-09: qty 1

## 2020-07-09 NOTE — TOC Progression Note (Addendum)
Transition of Care [facility]) - Progression Note    Patient Details  Name: Patrick Phillips MRN: [id number] Date of Birth: [birth year]  [facility]) CM/SW [place], LCSW Phone Number: 07/09/2020, 9:15 AM  Clinical Narrative:    9am-Barbara from [facility] is requesting assistance to find out what kind of Medicaid patient has as she has not been able to get in touch with anyone at [facility].   CSW contacted [facility] and spoke with his caseworker, Alfonso Ellis. She transferred me to the long term care department and CSW left a voicemail for [employer].   9:30am-CSW received return call from [place] with [facility] [phone] She stated patient does have full Medicaid but he would need to apply for Special Assistance. She stated family can apply for him in person at [facility] but she is checking to see if they are allowed to do so without having POA or Guardianship paperwork and will call CSW back.   10am-Brooke called CSW back and confirmed family can complete application as a non-authorized representative and they will start the application.   1pm-CSW received call back from [place] with [facility]. She stated that they can accept patient on Tuesday. She is going to check and make sure patient can still come since he has not received COVID vaccines. Per MD, patient can get the vaccine this weekend. She also requested Fl2 with Dementia as primary diagnosis; CSW faxed. No COVID test needed since he was recently positive. She requested:  -TB test (MD ordered) -Printed prescriptions for All medications due to using a new pharmacy  CSW spoke with patient's daughter, [name], and provided bed offer. She reported understanding though hates that patient has to go to a facility, but family cannot care for him now. She stated he was lucid at times yesterday and wanted to know why he could not come home. CSW explained need for special assistance  application follow up with [facility] and requested she call Pamala Hurry to make arrangements. She is concerned that patient's money will go to the facility and reported being worried that if patient clears up he will be angry at his money being gone. CSW expressed understanding at the difficult decision being made. She will contact [facility] Hurry and speak with her sister.   Expected Discharge Plan: Memory Care Barriers to Discharge: Continued Medical Work up  Expected Discharge Plan and Services Expected Discharge Plan: Memory Care In-house Referral: Clinical Social Work Discharge Planning Services: CM Consult Post Acute Care Choice: Nursing Home Living arrangements for the past 2 months: Single Family Home Expected Discharge Date: 06/29/20                                     Social Determinants of Health (SDOH) Interventions    Readmission Risk Interventions No flowsheet data found.

## 2020-07-09 NOTE — Progress Notes (Signed)
PROGRESS NOTE                                                                                                                                                                                                             Patient Demographics:    Patrick Phillips, is a 80 y.o. male, DOB - March 20, 1940, [id number]  Outpatient Primary MD for the patient is [name], MD   Admit date - 06/10/2020   LOS - 59  Chief Complaint  Patient presents with  . Altered Mental Status       Brief Narrative: Patient is a 80 y.o. male with PMHx of vascular dementia, prior CVA, DM-2, HTN-who presented with fever and confusion.  He was thought to have COVID-19 infection-and acute metabolic encephalopathy related to COVID-19 infection.  Hospital course complicated by persistent delirium.  See below for further details.  COVID-19 vaccinated status: Unknown  Significant Events: 4/14>> Admit to [facility] for fever/confusion  Significant studies: 4/14>>Chest x-ray: No PNA 4/14>> CT head: No acute intracranial abnormality. 4/14>> CT C-spine: No fracture 4/14>> MRI brain: No acute infarct. 4/15>> chest x-ray: No PNA 4/15>> CSF: WBC 1 (tube #4), protein 46, glucose 54 4/16>> EEG: Mild to moderate diffuse encephalopathy-no seizures.  COVID-19 medications: Remdesivir: 4/15>>4/17  Antibiotics: Vancomycin: 4/14 x1 Ceftriaxone: 4/14 x 1 Ampicillin: 4/14 x 1 Doxy: 4/22>> 4/26   Microbiology data: 4/14 >>blood culture: No growth 4/14>> urine culture: No growth 4/15>> blood culture: No growth 4/15>> urine culture: Multiple species 4/15>> CSF culture: No growth 4/15>> CSF PCR HSV 1/2: Negative. 4/15>> CSF HSV culture: Negative 4/15>> CSF arbovirus IgG: Negative 4/15>> CSF VDRL: Negative RMSF CSF serology IgM -ve  Procedures:  4/15>> lumbar puncture in the emergency room  Consults:  Neurology, psychiatry, Pall.Care  DVT prophylaxis:   enoxaparin (LOVENOX) injection 40 mg Start: 06/11/20 0600    Subjective:   No major issues overnight-remains pleasantly confused.   Assessment  & Plan :   Acute metabolic encephalopathy-superimposed on underlying Dementia with delirium: Initially encephalopathy was due to COVID-19 infection-however lately his altered mental status is more consistent with delirium related to dementia.  Neurology/psychiatry consulted-however he continues  to have waxing and waning delirium.  Currently remains stable on Seroquel and Depakote.  Awaiting Geri psych/SNF placement.  ? Tick Bite: 2 ticks found by nursing staff on patient 06/17/20 - RMSF IgM - ve, DW ID Dr Tommy Medal 06/23/20- not acute, no Rx.  AKI: Mild-likely hemodynamically mediated-resolved with IV fluids.  Dysphagia:-Patient followed closely by SLP, had MBS done 5/8, previously on dysphagia 3 diet-prior MD discussed with family-has been upgraded to a regular diet.  Borderline vitamin B12 deficiency: On supplementation.  Hypokalemia: Repleted-recheck periodically.  HTN: BP stable on Coreg, Norvasc, and hydralazine.  History of CVA: MRI brain negative x2-on aspirin/statin.  Dementia with delirium: See above  Moderate protein calorie malnutrition  GERD: On PPI  SIRS-COVID-19 infection: Completed Remdesivir x3 days.  Not hypoxic-COVID-19 isolation completed on 4/25.   No results for input(s): DDIMER, FERRITIN, LDH, CRP in the last 72 hours.  Lab Results  Component Value Date   [place] (A) 06/11/2020   [place] NEGATIVE 01/13/2020   DM-2: CBGs relatively stable-continue SSI-oral hypoglycemic agents on hold  Recent Labs    07/08/20 2022 07/09/20 0748 07/09/20 1140  GLUCAP 210* 196* 163*     Condition -   Guarded  Family Communication  : None at bedside  Code Status :  DNR  Diet :  Diet Order            Diet regular Room service appropriate? No; Fluid consistency: Thin  Diet effective now                   Disposition Plan  :   Status is: Inpatient  Remains inpatient appropriate because:Inpatient level of care appropriate due to severity of illness   Dispo: The patient is from: Home              Anticipated d/c is to: SNF              Patient currently is medically stable to d/c. awaiting SNF bed availability   Difficult to place patient No   Barriers to discharge: Awaiting SNF/[facility] psych bed.  Antimicorbials  :    Anti-infectives (From admission, onward)   Start     Dose/Rate Route Frequency Ordered Stop   06/18/20 1745  doxycycline (VIBRAMYCIN) 100 mg in sodium chloride 0.9 % 250 mL IVPB  Status:  Discontinued        100 mg 125 mL/hr over 120 Minutes Intravenous Every 12 hours 06/18/20 1648 06/23/20 0847   06/12/20 1000  remdesivir 100 mg in sodium chloride 0.9 % 100 mL IVPB        100 mg 200 mL/hr over 30 Minutes Intravenous Daily 06/11/20 0435 06/13/20 0900   06/11/20 0445  remdesivir 200 mg in sodium chloride 0.9% 250 mL IVPB        200 mg 580 mL/hr over 30 Minutes Intravenous Once 06/11/20 0434 06/11/20 0929   06/11/20 0230  vancomycin (VANCOREADY) IVPB 1500 mg/300 mL        1,500 mg 150 mL/hr over 120 Minutes Intravenous  Once 06/11/20 0229 06/11/20 0812   06/11/20 0215  ampicillin (OMNIPEN) 2 g in sodium chloride 0.9 % 100 mL IVPB        2 g 300 mL/hr over 20 Minutes Intravenous  Once 06/11/20 0157 06/11/20 0550   06/11/20 0215  vancomycin (VANCOREADY) IVPB 1000 mg/200 mL  Status:  Discontinued        1,000 mg 200 mL/hr over 60 Minutes Intravenous  Once 06/11/20 0157  06/11/20 0229   06/11/20 0200  cefTRIAXone (ROCEPHIN) 2 g in sodium chloride 0.9 % 100 mL IVPB        2 g 200 mL/hr over 30 Minutes Intravenous  Once 06/11/20 0157 06/11/20 0248      Inpatient Medications  Scheduled Meds: . amLODipine  10 mg Oral Daily  . aspirin EC  81 mg Oral Daily  . atorvastatin  40 mg Oral Daily  . carvedilol  6.25 mg Oral BID WC  . divalproex  250 mg Oral Q12H  .  enoxaparin (LOVENOX) injection  40 mg Subcutaneous Q24H  . feeding supplement  237 mL Oral BID BM  . fluticasone  2 spray Each Nare Daily  . hydrALAZINE  50 mg Oral Q8H  . insulin aspart  0-9 Units Subcutaneous TID WC  . LORazepam  0.5 mg Intravenous Once  . multivitamin with minerals  1 tablet Oral Daily  . naLOXone (NARCAN)  injection  0.4 mg Intravenous Once  . pantoprazole  40 mg Oral Daily  . QUEtiapine  50 mg Oral Q2000  . senna  2 tablet Oral Daily  . tamsulosin  0.4 mg Oral Daily  . thiamine  100 mg Oral Daily  . vitamin B-12  1,000 mcg Oral Daily   Continuous Infusions:  PRN Meds:.acetaminophen, haloperidol lactate, hydrALAZINE, [DISCONTINUED] ondansetron **OR** ondansetron (ZOFRAN) IV, sodium chloride   See all Orders from today for further details   Oren Binet M.D on 07/09/2020 at 2:06 PM  To page go to [email or web address] - use universal password  [facility] -  Office  [phone]    Objective:   Vitals:   07/07/20 2100 07/08/20 1500 07/08/20 2024 07/09/20 0430  BP: (!) 152/60 132/62 (!) 150/75 (!) 127/53  Pulse: 87 84 86 68  Resp: 17 17 17 17   Temp: 98 F (36.7 C) 98.3 F (36.8 C) 97.9 F (36.6 C) 98.1 F (36.7 C)  TempSrc: Axillary Oral Axillary Oral  SpO2: 99% 98% 98% 94%  Weight:      Height:        Wt Readings from Last 3 Encounters:  06/10/20 86.2 kg  06/10/20 86.2 kg  04/19/20 86.2 kg     Intake/Output Summary (Last 24 hours) at 07/09/2020 1406 Last data filed at 07/09/2020 1321 Gross per 24 hour  Intake 448 ml  Output --  Net 448 ml     Physical Exam Gen Exam: Pleasantly confused HEENT:atraumatic, normocephalic Chest: B/L clear to auscultation anteriorly CVS:S1S2 regular Abdomen:soft non tender, non distended Extremities:no edema Neurology: Non focal Skin: no rash     Data Review:    CBC No results for input(s): WBC, HGB, HCT, PLT, MCV, MCH, MCHC, RDW, LYMPHSABS, MONOABS, EOSABS, BASOSABS, BANDABS in the last 168  hours.  Invalid input(s): NEUTRABS, BANDSABD  Chemistries  No results for input(s): NA, K, CL, CO2, GLUCOSE, BUN, CREATININE, CALCIUM, MG, AST, ALT, ALKPHOS, BILITOT in the last 168 hours.  Invalid input(s): GFRCGP ------------------------------------------------------------------------------------------------------------------ No results for input(s): CHOL, HDL, LDLCALC, TRIG, CHOLHDL, LDLDIRECT in the last 72 hours.  Lab Results  Component Value Date   HGBA1C 6.6 (H) 06/11/2020   ------------------------------------------------------------------------------------------------------------------ No results for input(s): TSH, T4TOTAL, T3FREE, THYROIDAB in the last 72 hours.  Invalid input(s): FREET3 ------------------------------------------------------------------------------------------------------------------ No results for input(s): VITAMINB12, FOLATE, FERRITIN, TIBC, IRON, RETICCTPCT in the last 72 hours.  Coagulation profile No results for input(s): INR, PROTIME in the last 168 hours.  No results for input(s): DDIMER in the last 72 hours.  Cardiac Enzymes  No results for input(s): CKMB, TROPONINI, MYOGLOBIN in the last 168 hours.  Invalid input(s): CK ------------------------------------------------------------------------------------------------------------------    Component Value Date/Time   BNP 39.4 06/28/2020 0044    Micro Results No results found for this or any previous visit (from the past 240 hour(s)).  Radiology Reports CT Head Wo Contrast  Result Date: 06/10/2020 CLINICAL DATA:  Head and C-spine injury, fall, altered mental status EXAM: CT HEAD WITHOUT CONTRAST CT CERVICAL SPINE WITHOUT CONTRAST TECHNIQUE: Multidetector CT imaging of the head and cervical spine was performed following the standard protocol without intravenous contrast. Multiplanar CT image reconstructions of the cervical spine were also generated. COMPARISON:  05/04/2020 FINDINGS: CT HEAD  FINDINGS Brain: Stable mild brain atrophy pattern. No acute intracranial hemorrhage, new mass lesion, acute infarction, midline shift, herniation, hydrocephalus, or extra-axial fluid collection. No focal mass effect or edema. Cisterns are patent. Cerebellar atrophy as well. Vascular: Intracranial atherosclerosis at the skull base. No hyperdense vessel. Skull: Normal. Negative for fracture or focal lesion. Sinuses/Orbits: Minor scattered sinus mucosal thickening as before. Orbits are symmetric. No acute finding. Other: None. CT CERVICAL SPINE FINDINGS Alignment: Normal. Skull base and vertebrae: No acute fracture. No primary bone lesion or focal pathologic process. Soft tissues and spinal canal: Normal prevertebral soft tissues. No hemorrhage or hematoma appreciated. Carotid atherosclerosis. Disc levels: Similar mild multilevel degenerative disc disease and facet arthropathy. Facets are aligned. No subluxation or dislocation. Preserved vertebral body heights. Upper chest: Negative. Other: None. IMPRESSION: Stable brain atrophy pattern. No acute intracranial abnormality or interval change by noncontrast CT. Stable mild cervical degenerative changes as above. No acute osseous finding or malalignment by CT. Electronically Signed   By: Jerilynn Mages.  Shick M.D.   On: 06/10/2020 12:31   CT Cervical Spine Wo Contrast  Result Date: 06/10/2020 CLINICAL DATA:  Head and C-spine injury, fall, altered mental status EXAM: CT HEAD WITHOUT CONTRAST CT CERVICAL SPINE WITHOUT CONTRAST TECHNIQUE: Multidetector CT imaging of the head and cervical spine was performed following the standard protocol without intravenous contrast. Multiplanar CT image reconstructions of the cervical spine were also generated. COMPARISON:  05/04/2020 FINDINGS: CT HEAD FINDINGS Brain: Stable mild brain atrophy pattern. No acute intracranial hemorrhage, new mass lesion, acute infarction, midline shift, herniation, hydrocephalus, or extra-axial fluid collection. No  focal mass effect or edema. Cisterns are patent. Cerebellar atrophy as well. Vascular: Intracranial atherosclerosis at the skull base. No hyperdense vessel. Skull: Normal. Negative for fracture or focal lesion. Sinuses/Orbits: Minor scattered sinus mucosal thickening as before. Orbits are symmetric. No acute finding. Other: None. CT CERVICAL SPINE FINDINGS Alignment: Normal. Skull base and vertebrae: No acute fracture. No primary bone lesion or focal pathologic process. Soft tissues and spinal canal: Normal prevertebral soft tissues. No hemorrhage or hematoma appreciated. Carotid atherosclerosis. Disc levels: Similar mild multilevel degenerative disc disease and facet arthropathy. Facets are aligned. No subluxation or dislocation. Preserved vertebral body heights. Upper chest: Negative. Other: None. IMPRESSION: Stable brain atrophy pattern. No acute intracranial abnormality or interval change by noncontrast CT. Stable mild cervical degenerative changes as above. No acute osseous finding or malalignment by CT. Electronically Signed   By: Jerilynn Mages.  Shick M.D.   On: 06/10/2020 12:31   MR BRAIN WO CONTRAST  Result Date: 06/29/2020 CLINICAL DATA:  Right-sided facial drooping, altered mental status. EXAM: MRI HEAD WITHOUT CONTRAST TECHNIQUE: Multiplanar, multiecho pulse sequences of the brain and surrounding structures were obtained without intravenous contrast. COMPARISON:  June 10, 2020. FINDINGS: Brain: No acute infarction, hemorrhage, hydrocephalus, extra-axial collection or mass  lesion. Dilated perivascular space in inferior right basal ganglia. No substantial white matter disease for patient age. Vascular: Partial loss of flow void in the distal left vertebral artery, which could be due to stenosis. Normal flow void in the visualized proximal aICAs. Skull and upper cervical spine: Normal marrow signal. Sinuses/Orbits: Mild mucosal thickening of the frontal sinuses and moderate mucosal thickening of scattered ethmoid  air cells. Inferior left maxillary sinus mucous retention cyst. Bilateral exopthalmos, similar to prior. Other: No mastoid effusions. IMPRESSION: 1. No evidence of acute abnormality. 2. Partial loss of flow void in the distal left vertebral artery, similar to prior and possibly due to stenosis. Consider CT angio head and neck to further evaluate. 3. Frontoethmoidal paranasal sinus mucosal thickening. Electronically Signed   By: Margaretha Sheffield MD   On: 06/29/2020 07:47   MR Brain Wo Contrast (neuro protocol)  Result Date: 06/10/2020 CLINICAL DATA:  Acute neuro deficit.  Mental status change. EXAM: MRI HEAD WITHOUT CONTRAST TECHNIQUE: Multiplanar, multiecho pulse sequences of the brain and surrounding structures were obtained without intravenous contrast. COMPARISON:  CT head 06/10/2020 FINDINGS: Brain: Ventricle size and cerebral volume normal for age. Negative for acute infarct. No significant chronic ischemic change. Negative for hemorrhage or mass. Image quality degraded by mild motion. Vascular: Partial loss of flow void in the left internal carotid artery through the skull base and the distal left vertebral artery which may be due to slow flow or stenosis. Skull and upper cervical spine: Negative Sinuses/Orbits: Mild mucosal edema paranasal sinuses. No orbital mass. Bilateral exophthalmos. Other: None IMPRESSION: Negative for acute infarct. Partial loss of flow void in the left internal carotid artery distal left vertebral artery which could be due to proximal stenosis. Consider CT angio head and neck for further evaluation. Electronically Signed   By: Franchot Gallo M.D.   On: 06/10/2020 15:09   DG CHEST PORT 1 VIEW  Result Date: 07/04/2020 CLINICAL DATA:  Dyspnea EXAM: PORTABLE CHEST 1 VIEW COMPARISON:  06/25/2020 FINDINGS: Cardiac shadow is within normal limits. The lungs are clear bilaterally. Loop recorder is noted on the left. No bony abnormality is seen. IMPRESSION: No acute abnormality noted.  Electronically Signed   By: Inez Catalina M.D.   On: 07/04/2020 21:41   DG Chest Port 1 View  Result Date: 06/25/2020 CLINICAL DATA:  Shortness of breath and lethargy. EXAM: PORTABLE CHEST 1 VIEW COMPARISON:  Chest x-ray 06/24/2020 FINDINGS: The cardiac silhouette, mediastinal and hilar contours are within limits. Stable mild tortuosity and calcification the thoracic aorta. The lungs are clear of an acute process. No pleural effusions or pulmonary lesions. The bony thorax is intact. IMPRESSION: No acute cardiopulmonary findings. Electronically Signed   By: Marijo Sanes M.D.   On: 06/25/2020 14:15   DG Chest Port 1 View  Result Date: 06/24/2020 CLINICAL DATA:  Shortness of breath. EXAM: PORTABLE CHEST 1 VIEW COMPARISON:  June 20, 2020. FINDINGS: The heart size and mediastinal contours are within normal limits. Both lungs are clear. The visualized skeletal structures are unremarkable. IMPRESSION: No active disease. Electronically Signed   By: Marijo Conception M.D.   On: 06/24/2020 08:13   DG Chest Port 1 View  Result Date: 06/20/2020 CLINICAL DATA:  Shortness of breath EXAM: PORTABLE CHEST 1 VIEW COMPARISON:  06/11/2020 FINDINGS: Loop recorder is identified adjacent to the cardiac apex. Normal heart size. No pleural effusion or edema. No airspace densities identified. No acute osseous findings. IMPRESSION: No acute cardiopulmonary abnormalities. Electronically Signed   By:  Kerby Moors M.D.   On: 06/20/2020 08:34   DG Chest Port 1 View  Result Date: 06/11/2020 CLINICAL DATA:  Questionable sepsis. EXAM: PORTABLE CHEST 1 VIEW COMPARISON:  June 10, 2020 FINDINGS: There is no evidence of acute infiltrate, pleural effusion or pneumothorax. The heart size and mediastinal contours are within normal limits. A radiopaque loop recorder device is seen. The visualized skeletal structures are unremarkable. IMPRESSION: No active disease. Electronically Signed   By: Virgina Norfolk M.D.   On: 06/11/2020 03:10    DG Chest Port 1 View  Result Date: 06/10/2020 CLINICAL DATA:  Altered mental status. EXAM: PORTABLE CHEST 1 VIEW COMPARISON:  May 25, 2020 FINDINGS: Loop recorder in place. Cardiomediastinal silhouette is normal. Mediastinal contours appear intact. Tortuosity and calcific atherosclerotic disease of the aorta. There is no evidence of focal airspace consolidation, pleural effusion or pneumothorax. Osseous structures are without acute abnormality. Soft tissues are grossly normal. IMPRESSION: No active disease. Electronically Signed   By: Fidela Salisbury M.D.   On: 06/10/2020 11:15   DG Swallowing Func-Speech Pathology  Result Date: 07/02/2020 Objective Swallowing Evaluation: Type of Study: Bedside Swallow Evaluation  Patient Details Name: Spike Ekis MRN: [id number] Date of Birth: 1940-03-17 Today's Date: 07/02/2020 Time: SLP Start Time (ACUTE ONLY): 63 -SLP Stop Time (ACUTE ONLY): 1249 SLP Time Calculation (min) (ACUTE ONLY): 19 min Past Medical History: Past Medical History: Diagnosis Date . Cancer ([place])   skin cancer . Dementia ([place])  . Diabetes mellitus without complication ([place])  . Hypertension  . Memory deficit  Past Surgical History: Past Surgical History: Procedure Laterality Date . BACK SURGERY   . HERNIA REPAIR   . LOOP RECORDER INSERTION N/A 07/26/2017  Procedure: LOOP RECORDER INSERTION;  Surgeon: Evans Lance, MD;  Location: [place] [facility];  Service: Cardiovascular;  Laterality: N/A; . TEE WITHOUT CARDIOVERSION N/A 07/26/2017  Procedure: TRANSESOPHAGEAL ECHOCARDIOGRAM (TEE);  Surgeon: Fay Records, MD;  Location: [facility] ENDOSCOPY;  Service: Cardiovascular;  Laterality: N/A; . TRIGGER FINGER RELEASE Right 12/02/2015  Procedure: RIGHT THUMB RELEASE TRIGGER FINGER/A-1 PULLEY;  Surgeon: Leanora Cover, MD;  Location: [place];  Service: Orthopedics;  Laterality: Right; HPI: Pt is a 80 y.o. male with PMHx of vascular dementia, CVA, DM-2, HTN. On 4/14, pt took his dog for a walk and  the dog returned home without him. Pt was found lying on the side of road with no memory of what had happened but able to follow commands. Pt was taken to [facility] and d/c home with daughter following negative workup, but pt was brough to [facility] ED due to increased confusion. Pt found to be febrile and COVID+ and admitted for acute metabolic encephalopathy. MRI brain 4/14 negative for acute changes. CXR 4/15 negative for active disease. EEG 4/16: mild to moderate diffuse encephalopathy, nonspecific etiology. No seizures or epileptiform discharges. Psych consulted due to severe delirium.  No data recorded Assessment / Plan / Recommendation CHL IP CLINICAL IMPRESSIONS 07/02/2020 Clinical Impression Pt presented with oropharyngeal dysphagia characterized by impaired bolus control and a pharyngeal delay. He demonstrated premature spillage to the pyriform sinuses, penetration (PAS 5) and aspiration (PAS 7, 8) of thin liquids via cup and straw. Pt was unable to demonstrate compensatory strategies despite verbal prompts and cues and aspiration persisted despite use of 5cc and 10cc boluses. Laryngeal invasion was improved to PAS 2 with nectar thick liquids which is considered WNL. Aspiration resulted in an ineffective cough on two occasions, but all other instances were silent.  Per EMR, pt has required augmentative IV fluids every few days due to poor p.o. intake, and SLP anticipates that use of nectar thick liquids to eliminate aspiration would likely not improve this. It is anticipated that the pt has had intermittent aspiration of thin liquids throughout this 21-day admission. Pt's daughter, [name], was contacted via phone and she reported that the pt had also been inconsistently symptomic of aspiration prior to admission so his aspiration may be chronic issue. Pt is ambulatory, afebrile, and all five of his CXRs have been negative for active disease. Pt's case was discussed with Dr. Waldron Labs including potential options for  dysphagia management considering dehydration risk vs pulmonary risk. Considering his tolerance of intermittent aspiration thus far, it was agreed that his current diet will be continued with strict observance of swallowing precautions, consistent pt mobilization, and consistent oral care. Pt's daughter, [name], was educated regarding the pt's performance, concerns regarding potential for dehydration due to avoidance of liquids, and the potential progression of dysphagia in the setting of dementia. All of her questions were answered and she verbalized agreement with plan of care. SLP Visit Diagnosis Dysphagia, oropharyngeal phase (R13.12) Attention and concentration deficit following -- Frontal lobe and executive function deficit following -- Impact on safety and function Mild aspiration risk;Moderate aspiration risk   CHL IP TREATMENT RECOMMENDATION 07/02/2020 Treatment Recommendations Therapy as outlined in treatment plan below   Prognosis 07/02/2020 Prognosis for Safe Diet Advancement Fair Barriers to Reach Goals Cognitive deficits Barriers/Prognosis Comment -- CHL IP DIET RECOMMENDATION 07/02/2020 SLP Diet Recommendations Dysphagia 3 (Mech soft) solids;Thin liquid Liquid Administration via Cup;No straw Medication Administration Whole meds with puree Compensations Minimize environmental distractions;Slow rate;Small sips/bites Postural Changes Seated upright at 90 degrees   CHL IP OTHER RECOMMENDATIONS 07/02/2020 Recommended Consults -- Oral Care Recommendations Oral care BID;Staff/trained caregiver to provide oral care Other Recommendations --   CHL IP FOLLOW UP RECOMMENDATIONS 07/02/2020 Follow up Recommendations Skilled Nursing facility   [facility] IP FREQUENCY AND DURATION 07/02/2020 Speech Therapy Frequency (ACUTE ONLY) min 1 x/week Treatment Duration 2 weeks      CHL IP ORAL PHASE 07/02/2020 Oral Phase Impaired Oral - Pudding Teaspoon -- Oral - Pudding Cup -- Oral - Honey Teaspoon -- Oral - Honey Cup -- Oral - Nectar Teaspoon --  Oral - Nectar Cup -- Oral - Nectar Straw Premature spillage;Decreased bolus cohesion Oral - Thin Teaspoon -- Oral - Thin Cup Premature spillage;Decreased bolus cohesion Oral - Thin Straw Premature spillage;Decreased bolus cohesion Oral - Puree Decreased bolus cohesion;Premature spillage Oral - Mech Soft -- Oral - Regular WFL Oral - Multi-Consistency -- Oral - Pill Decreased bolus cohesion;Premature spillage Oral Phase - Comment --  CHL IP PHARYNGEAL PHASE 07/02/2020 Pharyngeal Phase Impaired Pharyngeal- Pudding Teaspoon -- Pharyngeal -- Pharyngeal- Pudding Cup -- Pharyngeal -- Pharyngeal- Honey Teaspoon -- Pharyngeal -- Pharyngeal- Honey Cup -- Pharyngeal -- Pharyngeal- Nectar Teaspoon -- Pharyngeal -- Pharyngeal- Nectar Cup Delayed swallow initiation-vallecula Pharyngeal -- Pharyngeal- Nectar Straw Delayed swallow initiation-vallecula;Delayed swallow initiation-pyriform sinuses;Penetration/Aspiration during swallow Pharyngeal Material enters airway, remains ABOVE vocal cords then ejected out Pharyngeal- Thin Teaspoon -- Pharyngeal -- Pharyngeal- Thin Cup Delayed swallow initiation-pyriform sinuses;Penetration/Aspiration during swallow;Penetration/Apiration after swallow Pharyngeal Material enters airway, CONTACTS cords and not ejected out;Material enters airway, passes BELOW cords and not ejected out despite cough attempt by patient;Material enters airway, passes BELOW cords without attempt by patient to eject out (silent aspiration) Pharyngeal- Thin Straw Delayed swallow initiation-pyriform sinuses;Penetration/Aspiration during swallow;Penetration/Apiration after swallow Pharyngeal Material enters airway, CONTACTS cords and  not ejected out;Material enters airway, passes BELOW cords without attempt by patient to eject out (silent aspiration);Material enters airway, passes BELOW cords and not ejected out despite cough attempt by patient Pharyngeal- Puree Delayed swallow initiation-vallecula Pharyngeal -- Pharyngeal-  Mechanical Soft -- Pharyngeal -- Pharyngeal- Regular Delayed swallow initiation-vallecula Pharyngeal -- Pharyngeal- Multi-consistency -- Pharyngeal -- Pharyngeal- Pill Delayed swallow initiation-vallecula Pharyngeal -- Pharyngeal Comment --  [facility] IP CERVICAL ESOPHAGEAL PHASE 07/02/2020 Cervical Esophageal Phase WFL Pudding Teaspoon -- Pudding Cup -- Honey Teaspoon -- Honey Cup -- Nectar Teaspoon -- Nectar Cup -- Nectar Straw -- Thin Teaspoon -- Thin Cup -- Thin Straw -- Puree -- Mechanical Soft -- Regular -- Multi-consistency -- Pill -- Cervical Esophageal Comment -- Shanika I. Hardin Negus, [place], [place] Office number [phone] Pager [phone] Horton Marshall 07/02/2020, 3:23 PM              EEG adult  Result Date: 06/29/2020 Lora Havens, MD     06/29/2020  1:23 PM Patient Name: Patrick Phillips MRN: [id number] Epilepsy Attending: Lora Havens Referring Physician/Provider: Dr Amie [place] Date: 06/29/2020 Duration: 21.43 mins Patient history: 80 year old male with waxing and waning mental status.  EEG to evaluate for seizures. Level of alertness: Awake AEDs during EEG study: Depakote Technical aspects: This EEG study was done with scalp electrodes positioned according to the 10-20 International system of electrode placement. Electrical activity was acquired at a sampling rate of 500Hz  and reviewed with a high frequency filter of 70Hz  and a low frequency filter of 1Hz . EEG data were recorded continuously and digitally stored. Description: The posterior dominant rhythm consists of 8-9 Hz activity of moderate voltage (25-35 uV) seen predominantly in posterior head regions, symmetric and reactive to eye opening and eye closing. EEG showed intermittent generalized 5 to 6 Hz theta slowing. Hyperventilation and photic stimulation were not performed.   ABNORMALITY - Intermittent slow, generalized IMPRESSION: This study is suggestive of mild diffuse encephalopathy, nonspecific etiology. No  seizures or epileptiform discharges were seen throughout the recording. Lora Havens   EEG adult  Result Date: 06/12/2020 Lora Havens, MD     06/12/2020  3:07 PM Patient Name: Colbey Wirtanen MRN: [id number] Epilepsy Attending: Lora Havens Referring Physician/Provider: Dr Oren Binet Date: 06/12/2020 Duration: 25.42 mins Patient history: 80yo M with ams. EEG to evaluate for seizure Level of alertness: Awake AEDs during EEG study: None Technical aspects: This EEG study was done with scalp electrodes positioned according to the 10-20 International system of electrode placement. Electrical activity was acquired at a sampling rate of 500Hz  and reviewed with a high frequency filter of 70Hz  and a low frequency filter of 1Hz . EEG data were recorded continuously and digitally stored. Description: No posterior dominant rhythm was seen. EEG showed continuous generalized 5 to 6 Hz theta slowing. Hyperventilation and photic stimulation were not performed.   ABNORMALITY - Continuous slow, generalized IMPRESSION: This study is suggestive of mild to moderate diffuse encephalopathy, nonspecific etiology. No seizures or epileptiform discharges were seen throughout the recording. Priyanka Barbra Sarks   DG Hips Bilat W or Wo Pelvis 3-4 Views  Result Date: 06/10/2020 CLINICAL DATA:  Found on side of road, last seen normal at 2130 hours last night, fall, dementia, tenderness of hips bilaterally EXAM: DG HIP (WITH OR WITHOUT PELVIS) 3-4V BILAT COMPARISON:  None FINDINGS: Osseous demineralization. Hip and SI joint spaces preserved. No acute fracture, dislocation, or bone destruction. Mild degenerative disc disease changes at visualized lower lumbar spine. IMPRESSION:  No acute osseous abnormalities. Electronically Signed   By: Lavonia Dana M.D.   On: 06/10/2020 11:48   CT HEAD CODE STROKE WO CONTRAST`  Result Date: 06/28/2020 CLINICAL DATA:  Code stroke.  Confusion.  Altered mental status. EXAM: CT HEAD WITHOUT CONTRAST  TECHNIQUE: Contiguous axial images were obtained from the base of the skull through the vertex without intravenous contrast. COMPARISON:  MRI 06/10/2020 FINDINGS: Brain: Age related volume loss. No evidence of old or acute small or large vessel infarction. No mass lesion, hemorrhage, hydrocephalus or extra-axial collection. Dilated perivascular space at the base of the brain. Vascular: There is atherosclerotic calcification of the major vessels at the base of the brain. Skull: Negative Sinuses/Orbits: Clear/normal Other: None ASPECTS ([place] Stroke Program Early CT Score) - Ganglionic level infarction (caudate, lentiform nuclei, internal capsule, insula, M1-M3 cortex): 7 - Supraganglionic infarction (M4-M6 cortex): 3 Total score (0-10 with 10 being normal): 10 IMPRESSION: 1. Normal head CT for age. Atherosclerotic calcification of the major vessels at base of the brain. 2. ASPECTS is 10 3. These results were communicated to Dr. Rory Percy at 7:18 pmon 5/2/2022by text page via the [facility] messaging system. Electronically Signed   By: Nelson Chimes M.D.   On: 06/28/2020 19:19

## 2020-07-10 DIAGNOSIS — G9341 Metabolic encephalopathy: Secondary | ICD-10-CM | POA: Diagnosis not present

## 2020-07-10 LAB — GLUCOSE, CAPILLARY
Glucose-Capillary: 252 mg/dL — ABNORMAL HIGH (ref 70–99)
Glucose-Capillary: 170 mg/dL — ABNORMAL HIGH (ref 70–99)
Glucose-Capillary: 214 mg/dL — ABNORMAL HIGH (ref 70–99)
Glucose-Capillary: 231 mg/dL — ABNORMAL HIGH (ref 70–99)

## 2020-07-10 NOTE — Progress Notes (Signed)
PROGRESS NOTE                                                                                                                                                                                                             Patient Demographics:    Patrick Phillips, is a 80 y.o. male, DOB - 04-11-1940, [id number]  Outpatient Primary MD for the patient is [name], MD   Admit date - 06/10/2020   LOS - 70  Chief Complaint  Patient presents with  . Altered Mental Status       Brief Narrative: Patient is a 80 y.o. male with PMHx of vascular dementia, prior CVA, DM-2, HTN-who presented with fever and confusion.  He was thought to have COVID-19 infection-and acute metabolic encephalopathy related to COVID-19 infection.  Hospital course complicated by persistent delirium.  See below for further details.  COVID-19 vaccinated status: Unknown  Significant Events: 4/14>> Admit to [facility] for fever/confusion  Significant studies: 4/14>>Chest x-ray: No PNA 4/14>> CT head: No acute intracranial abnormality. 4/14>> CT C-spine: No fracture 4/14>> MRI brain: No acute infarct. 4/15>> chest x-ray: No PNA 4/15>> CSF: WBC 1 (tube #4), protein 46, glucose 54 4/16>> EEG: Mild to moderate diffuse encephalopathy-no seizures.  COVID-19 medications: Remdesivir: 4/15>>4/17  Antibiotics: Vancomycin: 4/14 x1 Ceftriaxone: 4/14 x 1 Ampicillin: 4/14 x 1 Doxy: 4/22>> 4/26   Microbiology data: 4/14 >>blood culture: No growth 4/14>> urine culture: No growth 4/15>> blood culture: No growth 4/15>> urine culture: Multiple species 4/15>> CSF culture: No growth 4/15>> CSF PCR HSV 1/2: Negative. 4/15>> CSF HSV culture: Negative 4/15>> CSF arbovirus IgG: Negative 4/15>> CSF VDRL: Negative RMSF CSF serology IgM -ve  Procedures:  4/15>> lumbar puncture in the emergency room  Consults:  Neurology, psychiatry, Pall.Care  DVT prophylaxis:   enoxaparin (LOVENOX) injection 40 mg Start: 06/11/20 0600    Subjective:   Walking around in the hallway-nursing staff at his side.   Assessment  & Plan :   Acute metabolic encephalopathy-superimposed on underlying Dementia with delirium: Initially encephalopathy was due to COVID-19 infection-however lately his altered mental status is more consistent with delirium related to dementia.  Neurology/psychiatry  consulted-however he continues to have waxing and waning delirium.  Currently remains stable on Seroquel and Depakote.  Awaiting Geri psych/SNF placement.  ? Tick Bite: 2 ticks found by nursing staff on patient 06/17/20 - RMSF IgM - ve, DW ID Dr Tommy Medal 06/23/20- not acute, no Rx.  AKI: Mild-likely hemodynamically mediated-resolved with IV fluids.  Dysphagia:-Patient followed closely by SLP, had MBS done 5/8, previously on dysphagia 3 diet-prior MD discussed with family-has been upgraded to a regular diet.  Borderline vitamin B12 deficiency: On supplementation.  Hypokalemia: Repleted-recheck periodically.  HTN: BP stable on Coreg, Norvasc, and hydralazine.  History of CVA: MRI brain negative x2-on aspirin/statin.  Dementia with delirium: See above  Moderate protein calorie malnutrition  GERD: On PPI  SIRS-COVID-19 infection: Completed Remdesivir x3 days.  Not hypoxic-COVID-19 isolation completed on 4/25.   No results for input(s): DDIMER, FERRITIN, LDH, CRP in the last 72 hours.  Lab Results  Component Value Date   SARSCOV2NAA POSITIVE (A) 06/11/2020   [place] NEGATIVE 01/13/2020   DM-2: CBGs relatively stable-continue SSI-oral hypoglycemic agents on hold  Recent Labs    07/09/20 1611 07/10/20 0740 07/10/20 1135  GLUCAP 201* 252* 170*     Condition -   Guarded  Family Communication  : None at bedside  Code Status :  DNR  Diet :  Diet Order            Diet regular Room service appropriate? No; Fluid consistency: Thin  Diet effective now                   Disposition Plan  :   Status is: Inpatient  Remains inpatient appropriate because:Inpatient level of care appropriate due to severity of illness   Dispo: The patient is from: Home              Anticipated d/c is to: SNF              Patient currently is medically stable to d/c. awaiting SNF bed availability   Difficult to place patient No   Barriers to discharge: Awaiting SNF/Geri psych bed.  Antimicorbials  :    Anti-infectives (From admission, onward)   Start     Dose/Rate Route Frequency Ordered Stop   06/18/20 1745  doxycycline (VIBRAMYCIN) 100 mg in sodium chloride 0.9 % 250 mL IVPB  Status:  Discontinued        100 mg 125 mL/hr over 120 Minutes Intravenous Every 12 hours 06/18/20 1648 06/23/20 0847   06/12/20 1000  remdesivir 100 mg in sodium chloride 0.9 % 100 mL IVPB        100 mg 200 mL/hr over 30 Minutes Intravenous Daily 06/11/20 0435 06/13/20 0900   06/11/20 0445  remdesivir 200 mg in sodium chloride 0.9% 250 mL IVPB        200 mg 580 mL/hr over 30 Minutes Intravenous Once 06/11/20 0434 06/11/20 0929   06/11/20 0230  vancomycin (VANCOREADY) IVPB 1500 mg/300 mL        1,500 mg 150 mL/hr over 120 Minutes Intravenous  Once 06/11/20 0229 06/11/20 0812   06/11/20 0215  ampicillin (OMNIPEN) 2 g in sodium chloride 0.9 % 100 mL IVPB        2 g 300 mL/hr over 20 Minutes Intravenous  Once 06/11/20 0157 06/11/20 0550   06/11/20 0215  vancomycin (VANCOREADY) IVPB 1000 mg/200 mL  Status:  Discontinued        1,000 mg 200 mL/hr over 60 Minutes Intravenous  Once 06/11/20 0157 06/11/20 0229   06/11/20 0200  cefTRIAXone (ROCEPHIN) 2 g in sodium chloride 0.9 % 100 mL IVPB        2 g 200 mL/hr over 30 Minutes Intravenous  Once 06/11/20 0157 06/11/20 0248      Inpatient Medications  Scheduled Meds: . amLODipine  10 mg Oral Daily  . aspirin EC  81 mg Oral Daily  . atorvastatin  40 mg Oral Daily  . carvedilol  6.25 mg Oral BID WC  . divalproex  250 mg Oral Q12H  .  enoxaparin (LOVENOX) injection  40 mg Subcutaneous Q24H  . feeding supplement  237 mL Oral BID BM  . fluticasone  2 spray Each Nare Daily  . hydrALAZINE  50 mg Oral Q8H  . insulin aspart  0-9 Units Subcutaneous TID WC  . LORazepam  0.5 mg Intravenous Once  . multivitamin with minerals  1 tablet Oral Daily  . naLOXone (NARCAN)  injection  0.4 mg Intravenous Once  . pantoprazole  40 mg Oral Daily  . QUEtiapine  50 mg Oral Q2000  . senna  2 tablet Oral Daily  . tamsulosin  0.4 mg Oral Daily  . thiamine  100 mg Oral Daily  . vitamin B-12  1,000 mcg Oral Daily   Continuous Infusions:  PRN Meds:.acetaminophen, haloperidol lactate, hydrALAZINE, [DISCONTINUED] ondansetron **OR** ondansetron (ZOFRAN) IV, sodium chloride   See all Orders from today for further details   Oren Binet M.D on 07/10/2020 at 1:31 PM  To page go to [email or web address] - use universal password  [facility] -  Office  [phone]    Objective:   Vitals:   07/09/20 0430 07/09/20 1418 07/10/20 0835 07/10/20 1300  BP: (!) 127/53 (!) 152/62 (!) 162/65 (!) 145/53  Pulse: 68 86  84  Resp: 17 20  19   Temp: 98.1 F (36.7 C) 99.1 F (37.3 C)  97.9 F (36.6 C)  TempSrc: Oral Oral  Oral  SpO2: 94% 98%  99%  Weight:      Height:        Wt Readings from Last 3 Encounters:  06/10/20 86.2 kg  06/10/20 86.2 kg  04/19/20 86.2 kg     Intake/Output Summary (Last 24 hours) at 07/10/2020 1331 Last data filed at 07/10/2020 1245 Gross per 24 hour  Intake 540 ml  Output 1150 ml  Net -610 ml     Physical Exam Gen Exam: Pleasantly confused HEENT:atraumatic, normocephalic Chest: B/L clear to auscultation anteriorly CVS:S1S2 regular Abdomen:soft non tender, non distended Extremities:no edema Neurology: Non focal Skin: no rash     Data Review:    CBC No results for input(s): WBC, HGB, HCT, PLT, MCV, MCH, MCHC, RDW, LYMPHSABS, MONOABS, EOSABS, BASOSABS, BANDABS in the last 168 hours.  Invalid  input(s): NEUTRABS, BANDSABD  Chemistries  No results for input(s): NA, K, CL, CO2, GLUCOSE, BUN, CREATININE, CALCIUM, MG, AST, ALT, ALKPHOS, BILITOT in the last 168 hours.  Invalid input(s): GFRCGP ------------------------------------------------------------------------------------------------------------------ No results for input(s): CHOL, HDL, LDLCALC, TRIG, CHOLHDL, LDLDIRECT in the last 72 hours.  Lab Results  Component Value Date   HGBA1C 6.6 (H) 06/11/2020   ------------------------------------------------------------------------------------------------------------------ No results for input(s): TSH, T4TOTAL, T3FREE, THYROIDAB in the last 72 hours.  Invalid input(s): FREET3 ------------------------------------------------------------------------------------------------------------------ No results for input(s): VITAMINB12, FOLATE, FERRITIN, TIBC, IRON, RETICCTPCT in the last 72 hours.  Coagulation profile No results for input(s): INR, PROTIME in the last 168 hours.  No results for input(s): DDIMER in the last 72 hours.  Cardiac Enzymes No results for input(s): CKMB, TROPONINI, MYOGLOBIN in the last 168 hours.  Invalid input(s): CK ------------------------------------------------------------------------------------------------------------------    Component Value Date/Time   BNP 39.4 06/28/2020 0044    Micro Results No results found for this or any previous visit (from the past 240 hour(s)).  Radiology Reports MR BRAIN WO CONTRAST  Result Date: 06/29/2020 CLINICAL DATA:  Right-sided facial drooping, altered mental status. EXAM: MRI HEAD WITHOUT CONTRAST TECHNIQUE: Multiplanar, multiecho pulse sequences of the brain and surrounding structures were obtained without intravenous contrast. COMPARISON:  June 10, 2020. FINDINGS: Brain: No acute infarction, hemorrhage, hydrocephalus, extra-axial collection or mass lesion. Dilated perivascular space in inferior right basal  ganglia. No substantial white matter disease for patient age. Vascular: Partial loss of flow void in the distal left vertebral artery, which could be due to stenosis. Normal flow void in the visualized proximal aICAs. Skull and upper cervical spine: Normal marrow signal. Sinuses/Orbits: Mild mucosal thickening of the frontal sinuses and moderate mucosal thickening of scattered ethmoid air cells. Inferior left maxillary sinus mucous retention cyst. Bilateral exopthalmos, similar to prior. Other: No mastoid effusions. IMPRESSION: 1. No evidence of acute abnormality. 2. Partial loss of flow void in the distal left vertebral artery, similar to prior and possibly due to stenosis. Consider CT angio head and neck to further evaluate. 3. Frontoethmoidal paranasal sinus mucosal thickening. Electronically Signed   By: Margaretha Sheffield MD   On: 06/29/2020 07:47   MR Brain Wo Contrast (neuro protocol)  Result Date: 06/10/2020 CLINICAL DATA:  Acute neuro deficit.  Mental status change. EXAM: MRI HEAD WITHOUT CONTRAST TECHNIQUE: Multiplanar, multiecho pulse sequences of the brain and surrounding structures were obtained without intravenous contrast. COMPARISON:  CT head 06/10/2020 FINDINGS: Brain: Ventricle size and cerebral volume normal for age. Negative for acute infarct. No significant chronic ischemic change. Negative for hemorrhage or mass. Image quality degraded by mild motion. Vascular: Partial loss of flow void in the left internal carotid artery through the skull base and the distal left vertebral artery which may be due to slow flow or stenosis. Skull and upper cervical spine: Negative Sinuses/Orbits: Mild mucosal edema paranasal sinuses. No orbital mass. Bilateral exophthalmos. Other: None IMPRESSION: Negative for acute infarct. Partial loss of flow void in the left internal carotid artery distal left vertebral artery which could be due to proximal stenosis. Consider CT angio head and neck for further evaluation.  Electronically Signed   By: Franchot Gallo M.D.   On: 06/10/2020 15:09   DG CHEST PORT 1 VIEW  Result Date: 07/04/2020 CLINICAL DATA:  Dyspnea EXAM: PORTABLE CHEST 1 VIEW COMPARISON:  06/25/2020 FINDINGS: Cardiac shadow is within normal limits. The lungs are clear bilaterally. Loop recorder is noted on the left. No bony abnormality is seen. IMPRESSION: No acute abnormality noted. Electronically Signed   By: Inez Catalina M.D.   On: 07/04/2020 21:41   DG Chest Port 1 View  Result Date: 06/25/2020 CLINICAL DATA:  Shortness of breath and lethargy. EXAM: PORTABLE CHEST 1 VIEW COMPARISON:  Chest x-ray 06/24/2020 FINDINGS: The cardiac silhouette, mediastinal and hilar contours are within limits. Stable mild tortuosity and calcification the thoracic aorta. The lungs are clear of an acute process. No pleural effusions or pulmonary lesions. The bony thorax is intact. IMPRESSION: No acute cardiopulmonary findings. Electronically Signed   By: Marijo Sanes M.D.   On: 06/25/2020 14:15   DG Chest Port 1 View  Result Date: 06/24/2020 CLINICAL DATA:  Shortness of breath. EXAM: PORTABLE CHEST 1  VIEW COMPARISON:  June 20, 2020. FINDINGS: The heart size and mediastinal contours are within normal limits. Both lungs are clear. The visualized skeletal structures are unremarkable. IMPRESSION: No active disease. Electronically Signed   By: Lupita Raider M.D.   On: 06/24/2020 08:13   DG Chest Port 1 View  Result Date: 06/20/2020 CLINICAL DATA:  Shortness of breath EXAM: PORTABLE CHEST 1 VIEW COMPARISON:  06/11/2020 FINDINGS: Loop recorder is identified adjacent to the cardiac apex. Normal heart size. No pleural effusion or edema. No airspace densities identified. No acute osseous findings. IMPRESSION: No acute cardiopulmonary abnormalities. Electronically Signed   By: Signa Kell M.D.   On: 06/20/2020 08:34   DG Chest Port 1 View  Result Date: 06/11/2020 CLINICAL DATA:  Questionable sepsis. EXAM: PORTABLE CHEST 1  VIEW COMPARISON:  June 10, 2020 FINDINGS: There is no evidence of acute infiltrate, pleural effusion or pneumothorax. The heart size and mediastinal contours are within normal limits. A radiopaque loop recorder device is seen. The visualized skeletal structures are unremarkable. IMPRESSION: No active disease. Electronically Signed   By: Aram Candela M.D.   On: 06/11/2020 03:10   DG Swallowing Func-Speech Pathology  Result Date: 07/02/2020 Objective Swallowing Evaluation: Type of Study: Bedside Swallow Evaluation  Patient Details Name: Patrick Phillips MRN: [id number] Date of Birth: 21-Jan-1941 Today's Date: 07/02/2020 Time: SLP Start Time (ACUTE ONLY): 1230 -SLP Stop Time (ACUTE ONLY): 1249 SLP Time Calculation (min) (ACUTE ONLY): 19 min Past Medical History: Past Medical History: Diagnosis Date . Cancer (HCC)   skin cancer . Dementia (HCC)  . Diabetes mellitus without complication (HCC)  . Hypertension  . Memory deficit  Past Surgical History: Past Surgical History: Procedure Laterality Date . BACK SURGERY   . HERNIA REPAIR   . LOOP RECORDER INSERTION N/A 07/26/2017  Procedure: LOOP RECORDER INSERTION;  Surgeon: Marinus Maw, MD;  Location: [facility] INVASIVE CV LAB;  Service: Cardiovascular;  Laterality: N/A; . TEE WITHOUT CARDIOVERSION N/A 07/26/2017  Procedure: TRANSESOPHAGEAL ECHOCARDIOGRAM (TEE);  Surgeon: Pricilla Riffle, MD;  Location: [facility] ENDOSCOPY;  Service: Cardiovascular;  Laterality: N/A; . TRIGGER FINGER RELEASE Right 12/02/2015  Procedure: RIGHT THUMB RELEASE TRIGGER FINGER/A-1 PULLEY;  Surgeon: Betha Loa, MD;  Location: [facility];  Service: Orthopedics;  Laterality: Right; HPI: Pt is a 80 y.o. male with PMHx of vascular dementia, CVA, DM-2, HTN. On 4/14, pt took his dog for a walk and the dog returned home without him. Pt was found lying on the side of road with no memory of what had happened but able to follow commands. Pt was taken to [facility] and d/c home with daughter following negative workup,  but pt was brough to [facility] ED due to increased confusion. Pt found to be febrile and COVID+ and admitted for acute metabolic encephalopathy. MRI brain 4/14 negative for acute changes. CXR 4/15 negative for active disease. EEG 4/16: mild to moderate diffuse encephalopathy, nonspecific etiology. No seizures or epileptiform discharges. Psych consulted due to severe delirium.  No data recorded Assessment / Plan / Recommendation CHL IP CLINICAL IMPRESSIONS 07/02/2020 Clinical Impression Pt presented with oropharyngeal dysphagia characterized by impaired bolus control and a pharyngeal delay. He demonstrated premature spillage to the pyriform sinuses, penetration (PAS 5) and aspiration (PAS 7, 8) of thin liquids via cup and straw. Pt was unable to demonstrate compensatory strategies despite verbal prompts and cues and aspiration persisted despite use of 5cc and 10cc boluses. Laryngeal invasion was improved to PAS 2 with nectar thick liquids which  is considered WNL. Aspiration resulted in an ineffective cough on two occasions, but all other instances were silent. Per EMR, pt has required augmentative IV fluids every few days due to poor p.o. intake, and SLP anticipates that use of nectar thick liquids to eliminate aspiration would likely not improve this. It is anticipated that the pt has had intermittent aspiration of thin liquids throughout this 21-day admission. Pt's daughter, [name], was contacted via phone and she reported that the pt had also been inconsistently symptomic of aspiration prior to admission so his aspiration may be chronic issue. Pt is ambulatory, afebrile, and all five of his CXRs have been negative for active disease. Pt's case was discussed with Dr. Waldron Labs including potential options for dysphagia management considering dehydration risk vs pulmonary risk. Considering his tolerance of intermittent aspiration thus far, it was agreed that his current diet will be continued with strict observance of  swallowing precautions, consistent pt mobilization, and consistent oral care. Pt's daughter, [name], was educated regarding the pt's performance, concerns regarding potential for dehydration due to avoidance of liquids, and the potential progression of dysphagia in the setting of dementia. All of her questions were answered and she verbalized agreement with plan of care. SLP Visit Diagnosis Dysphagia, oropharyngeal phase (R13.12) Attention and concentration deficit following -- Frontal lobe and executive function deficit following -- Impact on safety and function Mild aspiration risk;Moderate aspiration risk   CHL IP TREATMENT RECOMMENDATION 07/02/2020 Treatment Recommendations Therapy as outlined in treatment plan below   Prognosis 07/02/2020 Prognosis for Safe Diet Advancement Fair Barriers to Reach Goals Cognitive deficits Barriers/Prognosis Comment -- CHL IP DIET RECOMMENDATION 07/02/2020 SLP Diet Recommendations Dysphagia 3 (Mech soft) solids;Thin liquid Liquid Administration via Cup;No straw Medication Administration Whole meds with puree Compensations Minimize environmental distractions;Slow rate;Small sips/bites Postural Changes Seated upright at 90 degrees   CHL IP OTHER RECOMMENDATIONS 07/02/2020 Recommended Consults -- Oral Care Recommendations Oral care BID;Staff/trained caregiver to provide oral care Other Recommendations --   CHL IP FOLLOW UP RECOMMENDATIONS 07/02/2020 Follow up Recommendations Skilled Nursing facility   [facility] IP FREQUENCY AND DURATION 07/02/2020 Speech Therapy Frequency (ACUTE ONLY) min 1 x/week Treatment Duration 2 weeks      CHL IP ORAL PHASE 07/02/2020 Oral Phase Impaired Oral - Pudding Teaspoon -- Oral - Pudding Cup -- Oral - Honey Teaspoon -- Oral - Honey Cup -- Oral - Nectar Teaspoon -- Oral - Nectar Cup -- Oral - Nectar Straw Premature spillage;Decreased bolus cohesion Oral - Thin Teaspoon -- Oral - Thin Cup Premature spillage;Decreased bolus cohesion Oral - Thin Straw Premature  spillage;Decreased bolus cohesion Oral - Puree Decreased bolus cohesion;Premature spillage Oral - Mech Soft -- Oral - Regular WFL Oral - Multi-Consistency -- Oral - Pill Decreased bolus cohesion;Premature spillage Oral Phase - Comment --  CHL IP PHARYNGEAL PHASE 07/02/2020 Pharyngeal Phase Impaired Pharyngeal- Pudding Teaspoon -- Pharyngeal -- Pharyngeal- Pudding Cup -- Pharyngeal -- Pharyngeal- Honey Teaspoon -- Pharyngeal -- Pharyngeal- Honey Cup -- Pharyngeal -- Pharyngeal- Nectar Teaspoon -- Pharyngeal -- Pharyngeal- Nectar Cup Delayed swallow initiation-vallecula Pharyngeal -- Pharyngeal- Nectar Straw Delayed swallow initiation-vallecula;Delayed swallow initiation-pyriform sinuses;Penetration/Aspiration during swallow Pharyngeal Material enters airway, remains ABOVE vocal cords then ejected out Pharyngeal- Thin Teaspoon -- Pharyngeal -- Pharyngeal- Thin Cup Delayed swallow initiation-pyriform sinuses;Penetration/Aspiration during swallow;Penetration/Apiration after swallow Pharyngeal Material enters airway, CONTACTS cords and not ejected out;Material enters airway, passes BELOW cords and not ejected out despite cough attempt by patient;Material enters airway, passes BELOW cords without attempt by patient to eject out (silent aspiration)  Pharyngeal- Thin Straw Delayed swallow initiation-pyriform sinuses;Penetration/Aspiration during swallow;Penetration/Apiration after swallow Pharyngeal Material enters airway, CONTACTS cords and not ejected out;Material enters airway, passes BELOW cords without attempt by patient to eject out (silent aspiration);Material enters airway, passes BELOW cords and not ejected out despite cough attempt by patient Pharyngeal- Puree Delayed swallow initiation-vallecula Pharyngeal -- Pharyngeal- Mechanical Soft -- Pharyngeal -- Pharyngeal- Regular Delayed swallow initiation-vallecula Pharyngeal -- Pharyngeal- Multi-consistency -- Pharyngeal -- Pharyngeal- Pill Delayed swallow  initiation-vallecula Pharyngeal -- Pharyngeal Comment --  CHL IP CERVICAL ESOPHAGEAL PHASE 07/02/2020 Cervical Esophageal Phase WFL Pudding Teaspoon -- Pudding Cup -- Honey Teaspoon -- Honey Cup -- Nectar Teaspoon -- Nectar Cup -- Nectar Straw -- Thin Teaspoon -- Thin Cup -- Thin Straw -- Puree -- Mechanical Soft -- Regular -- Multi-consistency -- Pill -- Cervical Esophageal Comment -- Shanika I. Hardin Negus, [place], [place] Office number [phone] Pager [phone] Horton Marshall 07/02/2020, 3:23 PM              EEG adult  Result Date: 06/29/2020 Lora Havens, MD     06/29/2020  1:23 PM Patient Name: Patrick Phillips MRN: [id number] Epilepsy Attending: Lora Havens Referring Physician/Provider: Dr Amie [place] Date: 06/29/2020 Duration: 21.43 mins Patient history: 80 year old male with waxing and waning mental status.  EEG to evaluate for seizures. Level of alertness: Awake AEDs during EEG study: Depakote Technical aspects: This EEG study was done with scalp electrodes positioned according to the 10-20 International system of electrode placement. Electrical activity was acquired at a sampling rate of 500Hz  and reviewed with a high frequency filter of 70Hz  and a low frequency filter of 1Hz . EEG data were recorded continuously and digitally stored. Description: The posterior dominant rhythm consists of 8-9 Hz activity of moderate voltage (25-35 uV) seen predominantly in posterior head regions, symmetric and reactive to eye opening and eye closing. EEG showed intermittent generalized 5 to 6 Hz theta slowing. Hyperventilation and photic stimulation were not performed.   ABNORMALITY - Intermittent slow, generalized IMPRESSION: This study is suggestive of mild diffuse encephalopathy, nonspecific etiology. No seizures or epileptiform discharges were seen throughout the recording. Lora Havens   EEG adult  Result Date: 06/12/2020 Lora Havens, MD     06/12/2020  3:07 PM Patient  Name: Patrick Phillips MRN: [id number] Epilepsy Attending: Lora Havens Referring Physician/Provider: Dr Oren Binet Date: 06/12/2020 Duration: 25.42 mins Patient history: 80yo M with ams. EEG to evaluate for seizure Level of alertness: Awake AEDs during EEG study: None Technical aspects: This EEG study was done with scalp electrodes positioned according to the 10-20 International system of electrode placement. Electrical activity was acquired at a sampling rate of 500Hz  and reviewed with a high frequency filter of 70Hz  and a low frequency filter of 1Hz . EEG data were recorded continuously and digitally stored. Description: No posterior dominant rhythm was seen. EEG showed continuous generalized 5 to 6 Hz theta slowing. Hyperventilation and photic stimulation were not performed.   ABNORMALITY - Continuous slow, generalized IMPRESSION: This study is suggestive of mild to moderate diffuse encephalopathy, nonspecific etiology. No seizures or epileptiform discharges were seen throughout the recording. Priyanka Barbra Sarks   CT HEAD CODE STROKE WO CONTRAST`  Result Date: 06/28/2020 CLINICAL DATA:  Code stroke.  Confusion.  Altered mental status. EXAM: CT HEAD WITHOUT CONTRAST TECHNIQUE: Contiguous axial images were obtained from the base of the skull through the vertex without intravenous contrast. COMPARISON:  MRI 06/10/2020 FINDINGS: Brain: Age related volume loss. No evidence  of old or acute small or large vessel infarction. No mass lesion, hemorrhage, hydrocephalus or extra-axial collection. Dilated perivascular space at the base of the brain. Vascular: There is atherosclerotic calcification of the major vessels at the base of the brain. Skull: Negative Sinuses/Orbits: Clear/normal Other: None ASPECTS ([place] Stroke Program Early CT Score) - Ganglionic level infarction (caudate, lentiform nuclei, internal capsule, insula, M1-M3 cortex): 7 - Supraganglionic infarction (M4-M6 cortex): 3 Total score (0-10 with 10 being  normal): 10 IMPRESSION: 1. Normal head CT for age. Atherosclerotic calcification of the major vessels at base of the brain. 2. ASPECTS is 10 3. These results were communicated to Dr. Rory Percy at 7:18 pmon 5/2/2022by text page via the [facility] messaging system. Electronically Signed   By: Nelson Chimes M.D.   On: 06/28/2020 19:19

## 2020-07-11 DIAGNOSIS — G9341 Metabolic encephalopathy: Secondary | ICD-10-CM | POA: Diagnosis not present

## 2020-07-11 LAB — COMPREHENSIVE METABOLIC PANEL
ALT: 29 U/L (ref 0–44)
AST: 28 U/L (ref 15–41)
Albumin: 3.5 g/dL (ref 3.5–5.0)
Alkaline Phosphatase: 45 U/L (ref 38–126)
Anion gap: 8 (ref 5–15)
BUN: 16 mg/dL (ref 8–23)
CO2: 24 mmol/L (ref 22–32)
Calcium: 9.5 mg/dL (ref 8.9–10.3)
Chloride: 106 mmol/L (ref 98–111)
Creatinine, Ser: 0.98 mg/dL (ref 0.61–1.24)
GFR, Estimated: >60 mL/min (ref >60)
Glucose, Bld: 238 mg/dL — ABNORMAL HIGH (ref 70–99)
Potassium: 4 mmol/L (ref 3.5–5.1)
Sodium: 138 mmol/L (ref 135–145)
Total Bilirubin: 0.4 mg/dL (ref 0.3–1.2)
Total Protein: 6.1 g/dL — ABNORMAL LOW (ref 6.5–8.1)

## 2020-07-11 LAB — GLUCOSE, CAPILLARY
Glucose-Capillary: 191 mg/dL — ABNORMAL HIGH (ref 70–99)
Glucose-Capillary: 271 mg/dL — ABNORMAL HIGH (ref 70–99)
Glucose-Capillary: 228 mg/dL — ABNORMAL HIGH (ref 70–99)

## 2020-07-11 LAB — CBC
HCT: 38.7 % — ABNORMAL LOW (ref 39.0–52.0)
Hemoglobin: 12.8 g/dL — ABNORMAL LOW (ref 13.0–17.0)
MCH: 28.6 pg (ref 26.0–34.0)
MCHC: 33.1 g/dL (ref 30.0–36.0)
MCV: 86.6 fL (ref 80.0–100.0)
Platelets: 268 10*3/uL (ref 150–400)
RBC: 4.47 MIL/uL (ref 4.22–5.81)
RDW: 14.1 % (ref 11.5–15.5)
WBC: 13.9 10*3/uL — ABNORMAL HIGH (ref 4.0–10.5)
nRBC: 0 % (ref 0.0–0.2)

## 2020-07-11 MED ORDER — HYDRALAZINE HCL 50 MG PO TABS
100.0000 mg | ORAL_TABLET | Freq: Three times a day (TID) | ORAL | Status: DC
  Administered 2020-07-11 – 2020-07-14 (×10): 100 mg via ORAL
  Filled 2020-07-11 (×9): qty 2

## 2020-07-11 MED ORDER — HYDROCORTISONE (PERIANAL) 2.5 % EX CREA
TOPICAL_CREAM | Freq: Two times a day (BID) | CUTANEOUS | Status: AC
  Filled 2020-07-11: qty 28.35

## 2020-07-11 MED ORDER — POLYETHYLENE GLYCOL 3350 17 G PO PACK
17.0000 g | PACK | Freq: Every day | ORAL | Status: DC | PRN
  Administered 2020-07-11: 17 g via ORAL
  Filled 2020-07-11: qty 1

## 2020-07-11 NOTE — Progress Notes (Signed)
Pt. Walking in hall with sitter. Pt. Tolerating well. Taking direction well.

## 2020-07-11 NOTE — Progress Notes (Addendum)
PROGRESS NOTE                                                                                                                                                                                                             Patient Demographics:    Patrick Phillips, is a 80 y.o. male, DOB - [birth year], [id number]  Outpatient Primary MD for the patient is [name], MD   Admit date - 06/10/2020   LOS - 73  Chief Complaint  Patient presents with  . Altered Mental Status       Brief Narrative: Patient is a 79 y.o. male with PMHx of vascular dementia, prior CVA, DM-2, HTN-who presented with fever and confusion.  He was thought to have COVID-19 infection-and acute metabolic encephalopathy related to COVID-19 infection.  Hospital course complicated by persistent delirium.  See below for further details.  COVID-19 vaccinated status: Unknown  Significant Events: 4/14>> Admit to [facility] for fever/confusion  Significant studies: 4/14>>Chest x-ray: No PNA 4/14>> CT head: No acute intracranial abnormality. 4/14>> CT C-spine: No fracture 4/14>> MRI brain: No acute infarct. 4/15>> chest x-ray: No PNA 4/15>> CSF: WBC 1 (tube #4), protein 46, glucose 54 4/16>> EEG: Mild to moderate diffuse encephalopathy-no seizures.  COVID-19 medications: Remdesivir: 4/15>>4/17  Antibiotics: Vancomycin: 4/14 x1 Ceftriaxone: 4/14 x 1 Ampicillin: 4/14 x 1 Doxy: 4/22>> 4/26   Microbiology data: 4/14 >>blood culture: No growth 4/14>> urine culture: No growth 4/15>> blood culture: No growth 4/15>> urine culture: Multiple species 4/15>> CSF culture: No growth 4/15>> CSF PCR HSV 1/2: Negative. 4/15>> CSF HSV culture: Negative 4/15>> CSF arbovirus IgG: Negative 4/15>> CSF VDRL: Negative RMSF CSF serology IgM -ve  Procedures:  4/15>> lumbar puncture in the emergency room  Consults:  Neurology, psychiatry, Pall.Care  DVT prophylaxis:   enoxaparin (LOVENOX) injection 40 mg Start: 06/11/20 0600    Subjective:   Sitting a chair and eating breakfast.  Denies any chest pain/shortness of breath/blurry vision.   Assessment  & Plan :   Acute metabolic encephalopathy-superimposed on underlying Dementia with delirium: Initially encephalopathy was due to COVID-19 infection-however lately his altered mental status is more consistent with delirium  related to dementia.  Neurology/psychiatry consulted-however he continues to have waxing and waning delirium.  Currently remains stable on Seroquel and Depakote.  Awaiting Geri psych/SNF placement.  ? Tick Bite: 2 ticks found by nursing staff on patient 06/17/20 - RMSF IgM - ve, DW ID Dr Tommy Medal 06/23/20- not acute, no Rx.  AKI: Mild-likely hemodynamically mediated-resolved with IV fluids.  Dysphagia:-Patient followed closely by SLP, had MBS done 5/8, previously on dysphagia 3 diet-prior MD discussed with family-has been upgraded to a regular diet.  Borderline vitamin B12 deficiency: On supplementation.  Hypokalemia: Repleted-recheck periodically.  HTN: BP on the higher side-increase hydralazine 200 mg 3 times daily, continue Coreg/amlodipine at current dose.  Follow and adjust.   History of CVA: MRI brain negative x2-on aspirin/statin.  Dementia with delirium: See above  Moderate protein calorie malnutrition  GERD: On PPI  SIRS-COVID-19 infection: Completed Remdesivir x3 days.  Not hypoxic-COVID-19 isolation completed on 4/25.   No results for input(s): DDIMER, FERRITIN, LDH, CRP in the last 72 hours.  Lab Results  Component Value Date   [place] (A) 06/11/2020   [place] NEGATIVE 01/13/2020   DM-2: CBGs relatively stable-continue SSI-oral hypoglycemic agents on hold  Recent Labs    07/10/20 1721 07/10/20 2053 07/11/20 0726  GLUCAP 214* 231* 191*     Condition -   Guarded  Family Communication  : None at bedside  Code Status :  DNR  Diet :  Diet  Order            Diet regular Room service appropriate? No; Fluid consistency: Thin  Diet effective now                  Disposition Plan  :   Status is: Inpatient  Remains inpatient appropriate because:Inpatient level of care appropriate due to severity of illness   Dispo: The patient is from: Home              Anticipated d/c is to: SNF              Patient currently is medically stable to d/c. awaiting SNF bed availability   Difficult to place patient No   Barriers to discharge: Awaiting SNF/[facility] bed.  Antimicorbials  :    Anti-infectives (From admission, onward)   Start     Dose/Rate Route Frequency Ordered Stop   06/18/20 1745  doxycycline (VIBRAMYCIN) 100 mg in sodium chloride 0.9 % 250 mL IVPB  Status:  Discontinued        100 mg 125 mL/hr over 120 Minutes Intravenous Every 12 hours 06/18/20 1648 06/23/20 0847   06/12/20 1000  remdesivir 100 mg in sodium chloride 0.9 % 100 mL IVPB        100 mg 200 mL/hr over 30 Minutes Intravenous Daily 06/11/20 0435 06/13/20 0900   06/11/20 0445  remdesivir 200 mg in sodium chloride 0.9% 250 mL IVPB        200 mg 580 mL/hr over 30 Minutes Intravenous Once 06/11/20 0434 06/11/20 0929   06/11/20 0230  vancomycin (VANCOREADY) IVPB 1500 mg/300 mL        1,500 mg 150 mL/hr over 120 Minutes Intravenous  Once 06/11/20 0229 06/11/20 0812   06/11/20 0215  ampicillin (OMNIPEN) 2 g in sodium chloride 0.9 % 100 mL IVPB        2 g 300 mL/hr over 20 Minutes Intravenous  Once 06/11/20 0157 06/11/20 0550   06/11/20 0215  vancomycin (VANCOREADY) IVPB 1000 mg/200 mL  Status:  Discontinued        1,000 mg 200 mL/hr over 60 Minutes Intravenous  Once 06/11/20 0157 06/11/20 0229   06/11/20 0200  cefTRIAXone (ROCEPHIN) 2 g in sodium chloride 0.9 % 100 mL IVPB        2 g 200 mL/hr over 30 Minutes Intravenous  Once 06/11/20 0157 06/11/20 0248      Inpatient Medications  Scheduled Meds: . amLODipine  10 mg Oral Daily  . aspirin EC  81 mg  Oral Daily  . atorvastatin  40 mg Oral Daily  . carvedilol  6.25 mg Oral BID WC  . divalproex  250 mg Oral Q12H  . enoxaparin (LOVENOX) injection  40 mg Subcutaneous Q24H  . feeding supplement  237 mL Oral BID BM  . fluticasone  2 spray Each Nare Daily  . hydrALAZINE  100 mg Oral Q8H  . insulin aspart  0-9 Units Subcutaneous TID WC  . multivitamin with minerals  1 tablet Oral Daily  . naLOXone (NARCAN)  injection  0.4 mg Intravenous Once  . pantoprazole  40 mg Oral Daily  . QUEtiapine  50 mg Oral Q2000  . senna  2 tablet Oral Daily  . tamsulosin  0.4 mg Oral Daily  . thiamine  100 mg Oral Daily  . vitamin B-12  1,000 mcg Oral Daily   Continuous Infusions:  PRN Meds:.acetaminophen, haloperidol lactate, hydrALAZINE, [DISCONTINUED] ondansetron **OR** ondansetron (ZOFRAN) IV, sodium chloride   See all Orders from today for further details   Oren Binet M.D on 07/11/2020 at 10:52 AM  To page go to [email or web address] - use universal password  [facility] -  Office  [phone]    Objective:   Vitals:   07/10/20 2030 07/11/20 0500 07/11/20 0604 07/11/20 0733  BP: (!) 152/76 (!) 184/79  (!) 162/80  Pulse: 83 99  92  Resp: 20 19  20   Temp: 98.4 F (36.9 C) 98.4 F (36.9 C)  98.6 F (37 C)  TempSrc: Axillary Axillary  Axillary  SpO2: 97% 98%  97%  Weight:   86.4 kg   Height:        Wt Readings from Last 3 Encounters:  07/11/20 86.4 kg  06/10/20 86.2 kg  04/19/20 86.2 kg     Intake/Output Summary (Last 24 hours) at 07/11/2020 1052 Last data filed at 07/11/2020 0804 Gross per 24 hour  Intake 650 ml  Output 300 ml  Net 350 ml     Physical Exam Gen Exam: Pleasantly confused HEENT:atraumatic, normocephalic Chest: B/L clear to auscultation anteriorly CVS:S1S2 regular Abdomen:soft non tender, non distended Extremities:no edema Neurology: Non focal Skin: no rash     Data Review:    CBC Recent Labs  Lab 07/11/20 0804  WBC 13.9*  HGB 12.8*  HCT  38.7*  PLT 268  MCV 86.6  MCH 28.6  MCHC 33.1  RDW 14.1    Chemistries  Recent Labs  Lab 07/11/20 0804  NA 138  K 4.0  CL 106  CO2 24  GLUCOSE 238*  BUN 16  CREATININE 0.98  CALCIUM 9.5  AST 28  ALT 29  ALKPHOS 45  BILITOT 0.4   ------------------------------------------------------------------------------------------------------------------ No results for input(s): CHOL, HDL, LDLCALC, TRIG, CHOLHDL, LDLDIRECT in the last 72 hours.  Lab Results  Component Value Date   HGBA1C 6.6 (H) 06/11/2020   ------------------------------------------------------------------------------------------------------------------ No results for input(s): TSH, T4TOTAL, T3FREE, THYROIDAB in the last 72 hours.  Invalid input(s): FREET3 ------------------------------------------------------------------------------------------------------------------ No results for input(s): VITAMINB12, FOLATE, FERRITIN,  TIBC, IRON, RETICCTPCT in the last 72 hours.  Coagulation profile No results for input(s): INR, PROTIME in the last 168 hours.  No results for input(s): DDIMER in the last 72 hours.  Cardiac Enzymes No results for input(s): CKMB, TROPONINI, MYOGLOBIN in the last 168 hours.  Invalid input(s): CK ------------------------------------------------------------------------------------------------------------------    Component Value Date/Time   BNP 39.4 06/28/2020 0044    Micro Results No results found for this or any previous visit (from the past 240 hour(s)).  Radiology Reports MR BRAIN WO CONTRAST  Result Date: 06/29/2020 CLINICAL DATA:  Right-sided facial drooping, altered mental status. EXAM: MRI HEAD WITHOUT CONTRAST TECHNIQUE: Multiplanar, multiecho pulse sequences of the brain and surrounding structures were obtained without intravenous contrast. COMPARISON:  June 10, 2020. FINDINGS: Brain: No acute infarction, hemorrhage, hydrocephalus, extra-axial collection or mass lesion.  Dilated perivascular space in inferior right basal ganglia. No substantial white matter disease for patient age. Vascular: Partial loss of flow void in the distal left vertebral artery, which could be due to stenosis. Normal flow void in the visualized proximal aICAs. Skull and upper cervical spine: Normal marrow signal. Sinuses/Orbits: Mild mucosal thickening of the frontal sinuses and moderate mucosal thickening of scattered ethmoid air cells. Inferior left maxillary sinus mucous retention cyst. Bilateral exopthalmos, similar to prior. Other: No mastoid effusions. IMPRESSION: 1. No evidence of acute abnormality. 2. Partial loss of flow void in the distal left vertebral artery, similar to prior and possibly due to stenosis. Consider CT angio head and neck to further evaluate. 3. Frontoethmoidal paranasal sinus mucosal thickening. Electronically Signed   By: Margaretha Sheffield MD   On: 06/29/2020 07:47   DG CHEST PORT 1 VIEW  Result Date: 07/04/2020 CLINICAL DATA:  Dyspnea EXAM: PORTABLE CHEST 1 VIEW COMPARISON:  06/25/2020 FINDINGS: Cardiac shadow is within normal limits. The lungs are clear bilaterally. Loop recorder is noted on the left. No bony abnormality is seen. IMPRESSION: No acute abnormality noted. Electronically Signed   By: Inez Catalina M.D.   On: 07/04/2020 21:41   DG Chest Port 1 View  Result Date: 06/25/2020 CLINICAL DATA:  Shortness of breath and lethargy. EXAM: PORTABLE CHEST 1 VIEW COMPARISON:  Chest x-ray 06/24/2020 FINDINGS: The cardiac silhouette, mediastinal and hilar contours are within limits. Stable mild tortuosity and calcification the thoracic aorta. The lungs are clear of an acute process. No pleural effusions or pulmonary lesions. The bony thorax is intact. IMPRESSION: No acute cardiopulmonary findings. Electronically Signed   By: Marijo Sanes M.D.   On: 06/25/2020 14:15   DG Chest Port 1 View  Result Date: 06/24/2020 CLINICAL DATA:  Shortness of breath. EXAM: PORTABLE CHEST 1  VIEW COMPARISON:  June 20, 2020. FINDINGS: The heart size and mediastinal contours are within normal limits. Both lungs are clear. The visualized skeletal structures are unremarkable. IMPRESSION: No active disease. Electronically Signed   By: Marijo Conception M.D.   On: 06/24/2020 08:13   DG Chest Port 1 View  Result Date: 06/20/2020 CLINICAL DATA:  Shortness of breath EXAM: PORTABLE CHEST 1 VIEW COMPARISON:  06/11/2020 FINDINGS: Loop recorder is identified adjacent to the cardiac apex. Normal heart size. No pleural effusion or edema. No airspace densities identified. No acute osseous findings. IMPRESSION: No acute cardiopulmonary abnormalities. Electronically Signed   By: Kerby Moors M.D.   On: 06/20/2020 08:34   DG Swallowing Func-Speech Pathology  Result Date: 07/02/2020 Objective Swallowing Evaluation: Type of Study: Bedside Swallow Evaluation  Patient Details Name: Dax Murguia MRN: [id number] Date  of Birth: March 16, 1940 Today's Date: 07/02/2020 Time: SLP Start Time (ACUTE ONLY): 1230 -SLP Stop Time (ACUTE ONLY): 1249 SLP Time Calculation (min) (ACUTE ONLY): 19 min Past Medical History: Past Medical History: Diagnosis Date . Cancer ([place])   skin cancer . Dementia ([place])  . Diabetes mellitus without complication ([place])  . Hypertension  . Memory deficit  Past Surgical History: Past Surgical History: Procedure Laterality Date . BACK SURGERY   . HERNIA REPAIR   . LOOP RECORDER INSERTION N/A 07/26/2017  Procedure: LOOP RECORDER INSERTION;  Surgeon: Evans Lance, MD;  Location: [place] [facility];  Service: Cardiovascular;  Laterality: N/A; . TEE WITHOUT CARDIOVERSION N/A 07/26/2017  Procedure: TRANSESOPHAGEAL ECHOCARDIOGRAM (TEE);  Surgeon: Fay Records, MD;  Location: [facility] ENDOSCOPY;  Service: Cardiovascular;  Laterality: N/A; . TRIGGER FINGER RELEASE Right 12/02/2015  Procedure: RIGHT THUMB RELEASE TRIGGER FINGER/A-1 PULLEY;  Surgeon: Leanora Cover, MD;  Location: [place];  Service: Orthopedics;   Laterality: Right; HPI: Pt is a 80 y.o. male with PMHx of vascular dementia, CVA, DM-2, HTN. On 4/14, pt took his dog for a walk and the dog returned home without him. Pt was found lying on the side of road with no memory of what had happened but able to follow commands. Pt was taken to [facility] and d/c home with daughter following negative workup, but pt was brough to [facility] ED due to increased confusion. Pt found to be febrile and COVID+ and admitted for acute metabolic encephalopathy. MRI brain 4/14 negative for acute changes. CXR 4/15 negative for active disease. EEG 4/16: mild to moderate diffuse encephalopathy, nonspecific etiology. No seizures or epileptiform discharges. Psych consulted due to severe delirium.  No data recorded Assessment / Plan / Recommendation CHL IP CLINICAL IMPRESSIONS 07/02/2020 Clinical Impression Pt presented with oropharyngeal dysphagia characterized by impaired bolus control and a pharyngeal delay. He demonstrated premature spillage to the pyriform sinuses, penetration (PAS 5) and aspiration (PAS 7, 8) of thin liquids via cup and straw. Pt was unable to demonstrate compensatory strategies despite verbal prompts and cues and aspiration persisted despite use of 5cc and 10cc boluses. Laryngeal invasion was improved to PAS 2 with nectar thick liquids which is considered WNL. Aspiration resulted in an ineffective cough on two occasions, but all other instances were silent. Per EMR, pt has required augmentative IV fluids every few days due to poor p.o. intake, and SLP anticipates that use of nectar thick liquids to eliminate aspiration would likely not improve this. It is anticipated that the pt has had intermittent aspiration of thin liquids throughout this 21-day admission. Pt's daughter, [name], was contacted via phone and she reported that the pt had also been inconsistently symptomic of aspiration prior to admission so his aspiration may be chronic issue. Pt is ambulatory, afebrile, and all five  of his CXRs have been negative for active disease. Pt's case was discussed with Dr. Waldron Labs including potential options for dysphagia management considering dehydration risk vs pulmonary risk. Considering his tolerance of intermittent aspiration thus far, it was agreed that his current diet will be continued with strict observance of swallowing precautions, consistent pt mobilization, and consistent oral care. Pt's daughter, [name], was educated regarding the pt's performance, concerns regarding potential for dehydration due to avoidance of liquids, and the potential progression of dysphagia in the setting of dementia. All of her questions were answered and she verbalized agreement with plan of care. SLP Visit Diagnosis Dysphagia, oropharyngeal phase (R13.12) Attention and concentration deficit following -- Frontal lobe  and executive function deficit following -- Impact on safety and function Mild aspiration risk;Moderate aspiration risk   CHL IP TREATMENT RECOMMENDATION 07/02/2020 Treatment Recommendations Therapy as outlined in treatment plan below   Prognosis 07/02/2020 Prognosis for Safe Diet Advancement Fair Barriers to Reach Goals Cognitive deficits Barriers/Prognosis Comment -- CHL IP DIET RECOMMENDATION 07/02/2020 SLP Diet Recommendations Dysphagia 3 (Mech soft) solids;Thin liquid Liquid Administration via Cup;No straw Medication Administration Whole meds with puree Compensations Minimize environmental distractions;Slow rate;Small sips/bites Postural Changes Seated upright at 90 degrees   CHL IP OTHER RECOMMENDATIONS 07/02/2020 Recommended Consults -- Oral Care Recommendations Oral care BID;Staff/trained caregiver to provide oral care Other Recommendations --   CHL IP FOLLOW UP RECOMMENDATIONS 07/02/2020 Follow up Recommendations Skilled Nursing facility   [facility] IP FREQUENCY AND DURATION 07/02/2020 Speech Therapy Frequency (ACUTE ONLY) min 1 x/week Treatment Duration 2 weeks      CHL IP ORAL PHASE 07/02/2020 Oral Phase  Impaired Oral - Pudding Teaspoon -- Oral - Pudding Cup -- Oral - Honey Teaspoon -- Oral - Honey Cup -- Oral - Nectar Teaspoon -- Oral - Nectar Cup -- Oral - Nectar Straw Premature spillage;Decreased bolus cohesion Oral - Thin Teaspoon -- Oral - Thin Cup Premature spillage;Decreased bolus cohesion Oral - Thin Straw Premature spillage;Decreased bolus cohesion Oral - Puree Decreased bolus cohesion;Premature spillage Oral - Mech Soft -- Oral - Regular WFL Oral - Multi-Consistency -- Oral - Pill Decreased bolus cohesion;Premature spillage Oral Phase - Comment --  CHL IP PHARYNGEAL PHASE 07/02/2020 Pharyngeal Phase Impaired Pharyngeal- Pudding Teaspoon -- Pharyngeal -- Pharyngeal- Pudding Cup -- Pharyngeal -- Pharyngeal- Honey Teaspoon -- Pharyngeal -- Pharyngeal- Honey Cup -- Pharyngeal -- Pharyngeal- Nectar Teaspoon -- Pharyngeal -- Pharyngeal- Nectar Cup Delayed swallow initiation-vallecula Pharyngeal -- Pharyngeal- Nectar Straw Delayed swallow initiation-vallecula;Delayed swallow initiation-pyriform sinuses;Penetration/Aspiration during swallow Pharyngeal Material enters airway, remains ABOVE vocal cords then ejected out Pharyngeal- Thin Teaspoon -- Pharyngeal -- Pharyngeal- Thin Cup Delayed swallow initiation-pyriform sinuses;Penetration/Aspiration during swallow;Penetration/Apiration after swallow Pharyngeal Material enters airway, CONTACTS cords and not ejected out;Material enters airway, passes BELOW cords and not ejected out despite cough attempt by patient;Material enters airway, passes BELOW cords without attempt by patient to eject out (silent aspiration) Pharyngeal- Thin Straw Delayed swallow initiation-pyriform sinuses;Penetration/Aspiration during swallow;Penetration/Apiration after swallow Pharyngeal Material enters airway, CONTACTS cords and not ejected out;Material enters airway, passes BELOW cords without attempt by patient to eject out (silent aspiration);Material enters airway, passes BELOW cords and  not ejected out despite cough attempt by patient Pharyngeal- Puree Delayed swallow initiation-vallecula Pharyngeal -- Pharyngeal- Mechanical Soft -- Pharyngeal -- Pharyngeal- Regular Delayed swallow initiation-vallecula Pharyngeal -- Pharyngeal- Multi-consistency -- Pharyngeal -- Pharyngeal- Pill Delayed swallow initiation-vallecula Pharyngeal -- Pharyngeal Comment --  CHL IP CERVICAL ESOPHAGEAL PHASE 07/02/2020 Cervical Esophageal Phase WFL Pudding Teaspoon -- Pudding Cup -- Honey Teaspoon -- Honey Cup -- Nectar Teaspoon -- Nectar Cup -- Nectar Straw -- Thin Teaspoon -- Thin Cup -- Thin Straw -- Puree -- Mechanical Soft -- Regular -- Multi-consistency -- Pill -- Cervical Esophageal Comment -- Shanika I. Hardin Negus, [place], [place] Office number [phone] Pager [phone] Horton Marshall 07/02/2020, 3:23 PM              EEG adult  Result Date: 06/29/2020 Lora Havens, MD     06/29/2020  1:23 PM Patient Name: Mohsen Helmer MRN: [id number] Epilepsy Attending: Lora Havens Referring Physician/Provider: Dr Amie [place] Date: 06/29/2020 Duration: 21.43 mins Patient history: 80 year old male with waxing and waning mental status.  EEG  to evaluate for seizures. Level of alertness: Awake AEDs during EEG study: Depakote Technical aspects: This EEG study was done with scalp electrodes positioned according to the 10-20 International system of electrode placement. Electrical activity was acquired at a sampling rate of 500Hz  and reviewed with a high frequency filter of 70Hz  and a low frequency filter of 1Hz . EEG data were recorded continuously and digitally stored. Description: The posterior dominant rhythm consists of 8-9 Hz activity of moderate voltage (25-35 uV) seen predominantly in posterior head regions, symmetric and reactive to eye opening and eye closing. EEG showed intermittent generalized 5 to 6 Hz theta slowing. Hyperventilation and photic stimulation were not performed.   ABNORMALITY  - Intermittent slow, generalized IMPRESSION: This study is suggestive of mild diffuse encephalopathy, nonspecific etiology. No seizures or epileptiform discharges were seen throughout the recording. Lora Havens   EEG adult  Result Date: 06/12/2020 Lora Havens, MD     06/12/2020  3:07 PM Patient Name: Dravin Schellin MRN: [id number] Epilepsy Attending: Lora Havens Referring Physician/Provider: Dr Oren Binet Date: 06/12/2020 Duration: 25.42 mins Patient history: 80yo M with ams. EEG to evaluate for seizure Level of alertness: Awake AEDs during EEG study: None Technical aspects: This EEG study was done with scalp electrodes positioned according to the 10-20 International system of electrode placement. Electrical activity was acquired at a sampling rate of 500Hz  and reviewed with a high frequency filter of 70Hz  and a low frequency filter of 1Hz . EEG data were recorded continuously and digitally stored. Description: No posterior dominant rhythm was seen. EEG showed continuous generalized 5 to 6 Hz theta slowing. Hyperventilation and photic stimulation were not performed.   ABNORMALITY - Continuous slow, generalized IMPRESSION: This study is suggestive of mild to moderate diffuse encephalopathy, nonspecific etiology. No seizures or epileptiform discharges were seen throughout the recording. Priyanka Barbra Sarks   CT HEAD CODE STROKE WO CONTRAST`  Result Date: 06/28/2020 CLINICAL DATA:  Code stroke.  Confusion.  Altered mental status. EXAM: CT HEAD WITHOUT CONTRAST TECHNIQUE: Contiguous axial images were obtained from the base of the skull through the vertex without intravenous contrast. COMPARISON:  MRI 06/10/2020 FINDINGS: Brain: Age related volume loss. No evidence of old or acute small or large vessel infarction. No mass lesion, hemorrhage, hydrocephalus or extra-axial collection. Dilated perivascular space at the base of the brain. Vascular: There is atherosclerotic calcification of the major vessels  at the base of the brain. Skull: Negative Sinuses/Orbits: Clear/normal Other: None ASPECTS ([place] Stroke Program Early CT Score) - Ganglionic level infarction (caudate, lentiform nuclei, internal capsule, insula, M1-M3 cortex): 7 - Supraganglionic infarction (M4-M6 cortex): 3 Total score (0-10 with 10 being normal): 10 IMPRESSION: 1. Normal head CT for age. Atherosclerotic calcification of the major vessels at base of the brain. 2. ASPECTS is 10 3. These results were communicated to Dr. Rory Percy at 7:18 pmon 5/2/2022by text page via the [facility] messaging system. Electronically Signed   By: Nelson Chimes M.D.   On: 06/28/2020 19:19

## 2020-07-12 DIAGNOSIS — G9341 Metabolic encephalopathy: Secondary | ICD-10-CM | POA: Diagnosis not present

## 2020-07-12 LAB — GLUCOSE, CAPILLARY
Glucose-Capillary: 312 mg/dL — ABNORMAL HIGH (ref 70–99)
Glucose-Capillary: 152 mg/dL — ABNORMAL HIGH (ref 70–99)
Glucose-Capillary: 314 mg/dL — ABNORMAL HIGH (ref 70–99)
Glucose-Capillary: 151 mg/dL — ABNORMAL HIGH (ref 70–99)
Glucose-Capillary: 403 mg/dL — ABNORMAL HIGH (ref 70–99)

## 2020-07-12 MED ORDER — INSULIN ASPART 100 UNIT/ML IJ SOLN
0.0000 [IU] | Freq: Three times a day (TID) | INTRAMUSCULAR | Status: DC
Start: 2020-07-13 — End: 2020-07-12

## 2020-07-12 MED ORDER — INSULIN ASPART 100 UNIT/ML IJ SOLN
0.0000 [IU] | Freq: Three times a day (TID) | INTRAMUSCULAR | Status: DC
Start: 2020-07-13 — End: 2020-07-15
  Administered 2020-07-13: 4 [IU] via SUBCUTANEOUS
  Administered 2020-07-13: 3 [IU] via SUBCUTANEOUS
  Administered 2020-07-13: 15 [IU] via SUBCUTANEOUS
  Administered 2020-07-13 – 2020-07-14 (×2): 4 [IU] via SUBCUTANEOUS
  Administered 2020-07-14 (×2): 7 [IU] via SUBCUTANEOUS

## 2020-07-12 NOTE — Progress Notes (Signed)
Physical Therapy Treatment Patient Details Name: Patrick Phillips MRN: [id number] DOB: 05-04-1940 Today's Date: 07/12/2020    History of Present Illness 80 y.o. male with presented with fever and confusion. Pt with dementia however still highly functional, took dog for a walk and dog came back without him. Pt later found lying on the side of road with no memory of what had happened but able to follow commands, taken to [facility] where MRI was negative for stroke and x-ray negative for fx. D/c home with daughter but pt with increasing confusion pt then brought  to [facility] ED 06/10/20. Pt with increasing confusion and agitation. Pt found to be febrile and COVID+. Admitted for acute metabolic encephalopathy.    PMHx of vascular dementia, prior CVA, DM-2, HTN    PT Comments    Patient received in room with NT sitter present; sleepy today, and has been walking multiple times with nursing, but wanting to get up again with PT. Continues to present and perform at a similar level as past PT sessions, and cognition does not seem to be improving. Left up in chair in his room with NT safety sitter present and supervising.   At this point Patrick Phillips has been mobilizing at the same level of physical assist for quite some time, and has been up and walking with nursing staff multiple times per day, on a daily basis. Cognition does not seem to be significantly improving.  Do not feel that skilled PT services are necessary at this point as he will unfortunately require custodial care in SNF facility and/or potential memory unit. Signing off, thank you for the opportunity to participate in his care!   Follow Up Recommendations  SNF;Supervision/Assistance - 24 hour     Equipment Recommendations  None recommended by PT    Recommendations for Other Services       Precautions / Restrictions Precautions Precautions: Fall Precaution Comments: found down along side of road; h/o combativeness with dementia Restrictions Weight  Bearing Restrictions: No    Mobility  Bed Mobility               General bed mobility comments: up in chair    Transfers Overall transfer level: Modified independent Equipment used: None Transfers: Sit to/from Stand Sit to Stand: Modified independent (Device/Increase time)         General transfer comment: increased time/effort, sometimes will reach out for objects around him like end of bed to help him stand  Ambulation/Gait Ambulation/Gait assistance: Min guard Gait Distance (Feet): 200 Feet Assistive device: None Gait Pattern/deviations: Step-through pattern;Decreased step length - right;Decreased step length - left;Shuffle;Trunk flexed;Drifts right/left Gait velocity: decreased   General Gait Details: flexed posture with shuffling pattern and forward head, occasional MinA for balance. Has been walking around unit mulitple times with nursing staff already.   Stairs             Wheelchair Mobility    Modified Rankin (Stroke Patients Only)       Balance Overall balance assessment: Needs assistance Sitting-balance support: No upper extremity supported;Feet supported Sitting balance-Leahy Scale: Good     Standing balance support: No upper extremity supported;During functional activity Standing balance-Leahy Scale: Fair Standing balance comment: min guard, occasional MinA                            Cognition Arousal/Alertness: Awake/alert Behavior During Therapy: Flat affect Overall Cognitive Status: History of cognitive impairments - at baseline  General Comments: flat affect, follows one step commands well, occasionally stops to take a couple of dance steps in the hallway. Fatigued today. Needed max cues to find his room.      Exercises      General Comments        Pertinent Vitals/Pain Pain Assessment: Faces Faces Pain Scale: No hurt Pain Intervention(s): Limited activity within  patient's tolerance;Monitored during session    Home Living                      Prior Function            PT Goals (current goals can now be found in the care plan section) Acute Rehab PT Goals Patient Stated Goal: find my family PT Goal Formulation: With patient Time For Goal Achievement: 07/12/20 Potential to Achieve Goals: Good Progress towards PT goals: Not progressing toward goals - comment (at custodial care level)    Frequency    Other (Comment) (PT signing off)      PT Plan Current plan remains appropriate    Co-evaluation              AM-PAC PT "6 Clicks" Mobility   Outcome Measure  Help needed turning from your back to your side while in a flat bed without using bedrails?: None Help needed moving from lying on your back to sitting on the side of a flat bed without using bedrails?: None Help needed moving to and from a bed to a chair (including a wheelchair)?: None Help needed standing up from a chair using your arms (e.g., wheelchair or bedside chair)?: None Help needed to walk in hospital room?: A Little Help needed climbing 3-5 steps with a railing? : A Little 6 Click Score: 22    End of Session Equipment Utilized During Treatment: Gait belt Activity Tolerance: Patient tolerated treatment well Patient left: in chair;with call bell/phone within reach;with nursing/sitter in room Nurse Communication: Mobility status PT Visit Diagnosis: Other abnormalities of gait and mobility (R26.89);Muscle weakness (generalized) (M62.81)     Time: 1530-1540 PT Time Calculation (min) (ACUTE ONLY): 10 min  Charges:  $Gait Training: 8-22 mins                     Patrick Phillips, DPT, PN1   Supplemental Physical Therapist Patrick Phillips    Pager [phone] Acute Rehab Office [phone]

## 2020-07-12 NOTE — Progress Notes (Signed)
PROGRESS NOTE                                                                                                                                                                                                             Patient Demographics:    Patrick Phillips, is a 80 y.o. male, DOB - 11/22/1940, YIF:[id number]  Outpatient Primary MD for the patient is [name], MD   Admit date - 06/10/2020   LOS - 75  Chief Complaint  Patient presents with  . Altered Mental Status       Brief Narrative: Patient is a 80 y.o. male with PMHx of vascular dementia, prior CVA, DM-2, HTN-who presented with fever and confusion.  He was thought to have COVID-19 infection-and acute metabolic encephalopathy related to COVID-19 infection.  Hospital course complicated by persistent delirium.  See below for further details.  COVID-19 vaccinated status: Unknown  Significant Events: 4/14>> Admit to [facility] for fever/confusion  Significant studies: 4/14>>Chest x-ray: No PNA 4/14>> CT head: No acute intracranial abnormality. 4/14>> CT C-spine: No fracture 4/14>> MRI brain: No acute infarct. 4/15>> chest x-ray: No PNA 4/15>> CSF: WBC 1 (tube #4), protein 46, glucose 54 4/16>> EEG: Mild to moderate diffuse encephalopathy-no seizures.  COVID-19 medications: Remdesivir: 4/15>>4/17  Antibiotics: Vancomycin: 4/14 x1 Ceftriaxone: 4/14 x 1 Ampicillin: 4/14 x 1 Doxy: 4/22>> 4/26   Microbiology data: 4/14 >>blood culture: No growth 4/14>> urine culture: No growth 4/15>> blood culture: No growth 4/15>> urine culture: Multiple species 4/15>> CSF culture: No growth 4/15>> CSF PCR HSV 1/2: Negative. 4/15>> CSF HSV culture: Negative 4/15>> CSF arbovirus IgG: Negative 4/15>> CSF VDRL: Negative RMSF CSF serology IgM -ve  Procedures:  4/15>> lumbar puncture in the emergency room  Consults:  Neurology, psychiatry, Pall.Care  DVT prophylaxis:   enoxaparin (LOVENOX) injection 40 mg Start: 06/11/20 0600    Subjective:   No major issues overnight-unchanged-pleasantly confused-was ambulating in the hallway-when I saw him earlier this morning.  Subsequently he was sitting at bedside chair.  Answers simple questions appropriately.   Assessment  & Plan :   Acute metabolic encephalopathy-superimposed on underlying Dementia with delirium: Initially encephalopathy was  due to COVID-19 infection-however lately his altered mental status is more consistent with delirium related to dementia.  Neurology/psychiatry consulted-however he continues to have waxing and waning delirium.  Currently remains stable on Seroquel and Depakote.  Awaiting Geri psych/SNF placement.  ? Tick Bite: 2 ticks found by nursing staff on patient 06/17/20 - RMSF IgM - ve, DW ID Dr Tommy Medal 06/23/20- not acute, no Rx.  AKI: Mild-likely hemodynamically mediated-resolved with IV fluids.  Dysphagia:-Patient followed closely by SLP, had MBS done 5/8, previously on dysphagia 3 diet-prior MD discussed with family-has been upgraded to a regular diet.  Borderline vitamin B12 deficiency: On supplementation.  Hypokalemia: Repleted-recheck periodically.  HTN: BP better-continue hydralazine, Coreg and amlodipine.  We will follow and adjust.    History of CVA: MRI brain negative x2-on aspirin/statin.  Dementia with delirium: See above  Moderate protein calorie malnutrition  GERD: On PPI  SIRS-COVID-19 infection: Completed Remdesivir x3 days.  Not hypoxic-COVID-19 isolation completed on 4/25.   No results for input(s): DDIMER, FERRITIN, LDH, CRP in the last 72 hours.  Lab Results  Component Value Date   [place] (A) 06/11/2020   [place] NEGATIVE 01/13/2020   DM-2: CBGs relatively stable-continue SSI-oral hypoglycemic agents on hold  Recent Labs    07/11/20 1608 07/11/20 2119 07/12/20 0708  GLUCAP 228* 312* 152*     Condition -   Guarded  Family  Communication  : None at bedside  Code Status :  DNR  Diet :  Diet Order            Diet regular Room service appropriate? No; Fluid consistency: Thin  Diet effective now                  Disposition Plan  :   Status is: Inpatient  Remains inpatient appropriate because:Inpatient level of care appropriate due to severity of illness   Dispo: The patient is from: Home              Anticipated d/c is to: SNF              Patient currently is medically stable to d/c. awaiting SNF bed availability   Difficult to place patient No   Barriers to discharge: Awaiting SNF/Geri psych bed.  Antimicorbials  :    Anti-infectives (From admission, onward)   Start     Dose/Rate Route Frequency Ordered Stop   06/18/20 1745  doxycycline (VIBRAMYCIN) 100 mg in sodium chloride 0.9 % 250 mL IVPB  Status:  Discontinued        100 mg 125 mL/hr over 120 Minutes Intravenous Every 12 hours 06/18/20 1648 06/23/20 0847   06/12/20 1000  remdesivir 100 mg in sodium chloride 0.9 % 100 mL IVPB        100 mg 200 mL/hr over 30 Minutes Intravenous Daily 06/11/20 0435 06/13/20 0900   06/11/20 0445  remdesivir 200 mg in sodium chloride 0.9% 250 mL IVPB        200 mg 580 mL/hr over 30 Minutes Intravenous Once 06/11/20 0434 06/11/20 0929   06/11/20 0230  vancomycin (VANCOREADY) IVPB 1500 mg/300 mL        1,500 mg 150 mL/hr over 120 Minutes Intravenous  Once 06/11/20 0229 06/11/20 0812   06/11/20 0215  ampicillin (OMNIPEN) 2 g in sodium chloride 0.9 % 100 mL IVPB        2 g 300 mL/hr over 20 Minutes Intravenous  Once 06/11/20 0157 06/11/20 0550   06/11/20 0215  vancomycin (VANCOREADY) IVPB 1000 mg/200 mL  Status:  Discontinued        1,000 mg 200 mL/hr over 60 Minutes Intravenous  Once 06/11/20 0157 06/11/20 0229   06/11/20 0200  cefTRIAXone (ROCEPHIN) 2 g in sodium chloride 0.9 % 100 mL IVPB        2 g 200 mL/hr over 30 Minutes Intravenous  Once 06/11/20 0157 06/11/20 0248      Inpatient  Medications  Scheduled Meds: . amLODipine  10 mg Oral Daily  . aspirin EC  81 mg Oral Daily  . atorvastatin  40 mg Oral Daily  . carvedilol  6.25 mg Oral BID WC  . divalproex  250 mg Oral Q12H  . enoxaparin (LOVENOX) injection  40 mg Subcutaneous Q24H  . feeding supplement  237 mL Oral BID BM  . fluticasone  2 spray Each Nare Daily  . hydrALAZINE  100 mg Oral Q8H  . hydrocortisone   Rectal BID  . insulin aspart  0-9 Units Subcutaneous TID WC  . multivitamin with minerals  1 tablet Oral Daily  . naLOXone (NARCAN)  injection  0.4 mg Intravenous Once  . pantoprazole  40 mg Oral Daily  . QUEtiapine  50 mg Oral Q2000  . senna  2 tablet Oral Daily  . tamsulosin  0.4 mg Oral Daily  . thiamine  100 mg Oral Daily  . vitamin B-12  1,000 mcg Oral Daily   Continuous Infusions:  PRN Meds:.acetaminophen, haloperidol lactate, hydrALAZINE, [DISCONTINUED] ondansetron **OR** ondansetron (ZOFRAN) IV, polyethylene glycol, sodium chloride   See all Orders from today for further details   Oren Binet M.D on 07/12/2020 at 11:16 AM  To page go to [email or web address] - use universal password  [facility] -  Office  [phone]    Objective:   Vitals:   07/11/20 1611 07/11/20 2005 07/12/20 0032 07/12/20 0800  BP: (!) 158/63 (!) 137/57 (!) 153/52 (!) 150/63  Pulse: 84 80 74   Resp: 18 20 16    Temp: 97.8 F (36.6 C) (!) 97.5 F (36.4 C) 98.4 F (36.9 C)   TempSrc: Oral Axillary Oral   SpO2: 98% 97% 93%   Weight:      Height:        Wt Readings from Last 3 Encounters:  07/11/20 86.4 kg  06/10/20 86.2 kg  04/19/20 86.2 kg     Intake/Output Summary (Last 24 hours) at 07/12/2020 1116 Last data filed at 07/12/2020 0854 Gross per 24 hour  Intake 960 ml  Output --  Net 960 ml     Physical Exam Gen Exam: Pleasantly confused HEENT:atraumatic, normocephalic Chest: B/L clear to auscultation anteriorly CVS:S1S2 regular Abdomen:soft non tender, non distended Extremities:no  edema Neurology: Non focal Skin: no rash     Data Review:    CBC Recent Labs  Lab 07/11/20 0804  WBC 13.9*  HGB 12.8*  HCT 38.7*  PLT 268  MCV 86.6  MCH 28.6  MCHC 33.1  RDW 14.1    Chemistries  Recent Labs  Lab 07/11/20 0804  NA 138  K 4.0  CL 106  CO2 24  GLUCOSE 238*  BUN 16  CREATININE 0.98  CALCIUM 9.5  AST 28  ALT 29  ALKPHOS 45  BILITOT 0.4   ------------------------------------------------------------------------------------------------------------------ No results for input(s): CHOL, HDL, LDLCALC, TRIG, CHOLHDL, LDLDIRECT in the last 72 hours.  Lab Results  Component Value Date   HGBA1C 6.6 (H) 06/11/2020   ------------------------------------------------------------------------------------------------------------------ No results for input(s): TSH, T4TOTAL, T3FREE, THYROIDAB in  the last 72 hours.  Invalid input(s): FREET3 ------------------------------------------------------------------------------------------------------------------ No results for input(s): VITAMINB12, FOLATE, FERRITIN, TIBC, IRON, RETICCTPCT in the last 72 hours.  Coagulation profile No results for input(s): INR, PROTIME in the last 168 hours.  No results for input(s): DDIMER in the last 72 hours.  Cardiac Enzymes No results for input(s): CKMB, TROPONINI, MYOGLOBIN in the last 168 hours.  Invalid input(s): CK ------------------------------------------------------------------------------------------------------------------    Component Value Date/Time   BNP 39.4 06/28/2020 0044    Micro Results No results found for this or any previous visit (from the past 240 hour(s)).  Radiology Reports MR BRAIN WO CONTRAST  Result Date: 06/29/2020 CLINICAL DATA:  Right-sided facial drooping, altered mental status. EXAM: MRI HEAD WITHOUT CONTRAST TECHNIQUE: Multiplanar, multiecho pulse sequences of the brain and surrounding structures were obtained without intravenous contrast.  COMPARISON:  June 10, 2020. FINDINGS: Brain: No acute infarction, hemorrhage, hydrocephalus, extra-axial collection or mass lesion. Dilated perivascular space in inferior right basal ganglia. No substantial white matter disease for patient age. Vascular: Partial loss of flow void in the distal left vertebral artery, which could be due to stenosis. Normal flow void in the visualized proximal aICAs. Skull and upper cervical spine: Normal marrow signal. Sinuses/Orbits: Mild mucosal thickening of the frontal sinuses and moderate mucosal thickening of scattered ethmoid air cells. Inferior left maxillary sinus mucous retention cyst. Bilateral exopthalmos, similar to prior. Other: No mastoid effusions. IMPRESSION: 1. No evidence of acute abnormality. 2. Partial loss of flow void in the distal left vertebral artery, similar to prior and possibly due to stenosis. Consider CT angio head and neck to further evaluate. 3. Frontoethmoidal paranasal sinus mucosal thickening. Electronically Signed   By: Margaretha Sheffield MD   On: 06/29/2020 07:47   DG CHEST PORT 1 VIEW  Result Date: 07/04/2020 CLINICAL DATA:  Dyspnea EXAM: PORTABLE CHEST 1 VIEW COMPARISON:  06/25/2020 FINDINGS: Cardiac shadow is within normal limits. The lungs are clear bilaterally. Loop recorder is noted on the left. No bony abnormality is seen. IMPRESSION: No acute abnormality noted. Electronically Signed   By: Inez Catalina M.D.   On: 07/04/2020 21:41   DG Chest Port 1 View  Result Date: 06/25/2020 CLINICAL DATA:  Shortness of breath and lethargy. EXAM: PORTABLE CHEST 1 VIEW COMPARISON:  Chest x-ray 06/24/2020 FINDINGS: The cardiac silhouette, mediastinal and hilar contours are within limits. Stable mild tortuosity and calcification the thoracic aorta. The lungs are clear of an acute process. No pleural effusions or pulmonary lesions. The bony thorax is intact. IMPRESSION: No acute cardiopulmonary findings. Electronically Signed   By: Marijo Sanes M.D.    On: 06/25/2020 14:15   DG Chest Port 1 View  Result Date: 06/24/2020 CLINICAL DATA:  Shortness of breath. EXAM: PORTABLE CHEST 1 VIEW COMPARISON:  June 20, 2020. FINDINGS: The heart size and mediastinal contours are within normal limits. Both lungs are clear. The visualized skeletal structures are unremarkable. IMPRESSION: No active disease. Electronically Signed   By: Marijo Conception M.D.   On: 06/24/2020 08:13   DG Chest Port 1 View  Result Date: 06/20/2020 CLINICAL DATA:  Shortness of breath EXAM: PORTABLE CHEST 1 VIEW COMPARISON:  06/11/2020 FINDINGS: Loop recorder is identified adjacent to the cardiac apex. Normal heart size. No pleural effusion or edema. No airspace densities identified. No acute osseous findings. IMPRESSION: No acute cardiopulmonary abnormalities. Electronically Signed   By: Kerby Moors M.D.   On: 06/20/2020 08:34   DG Swallowing Func-Speech Pathology  Result Date: 07/02/2020 Objective Swallowing  Evaluation: Type of Study: Bedside Swallow Evaluation  Patient Details Name: Patrick Phillips MRN: [id number] Date of Birth: 09/12/1940 Today's Date: 07/02/2020 Time: SLP Start Time (ACUTE ONLY): 45 -SLP Stop Time (ACUTE ONLY): 1249 SLP Time Calculation (min) (ACUTE ONLY): 19 min Past Medical History: Past Medical History: Diagnosis Date . Cancer ([place])   skin cancer . Dementia ([place])  . Diabetes mellitus without complication ([place])  . Hypertension  . Memory deficit  Past Surgical History: Past Surgical History: Procedure Laterality Date . BACK SURGERY   . HERNIA REPAIR   . LOOP RECORDER INSERTION N/A 07/26/2017  Procedure: LOOP RECORDER INSERTION;  Surgeon: Evans Lance, MD;  Location: [place] [facility];  Service: Cardiovascular;  Laterality: N/A; . TEE WITHOUT CARDIOVERSION N/A 07/26/2017  Procedure: TRANSESOPHAGEAL ECHOCARDIOGRAM (TEE);  Surgeon: Fay Records, MD;  Location: [facility];  Service: Cardiovascular;  Laterality: N/A; . TRIGGER FINGER RELEASE Right 12/02/2015  Procedure: RIGHT  THUMB RELEASE TRIGGER FINGER/A-1 PULLEY;  Surgeon: Leanora Cover, MD;  Location: [place];  Service: Orthopedics;  Laterality: Right; HPI: Pt is a 80 y.o. male with PMHx of vascular dementia, CVA, DM-2, HTN. On 4/14, pt took his dog for a walk and the dog returned home without him. Pt was found lying on the side of road with no memory of what had happened but able to follow commands. Pt was taken to [facility] and d/c home with daughter following negative workup, but pt was brough to [facility] ED due to increased confusion. Pt found to be febrile and COVID+ and admitted for acute metabolic encephalopathy. MRI brain 4/14 negative for acute changes. CXR 4/15 negative for active disease. EEG 4/16: mild to moderate diffuse encephalopathy, nonspecific etiology. No seizures or epileptiform discharges. Psych consulted due to severe delirium.  No data recorded Assessment / Plan / Recommendation CHL IP CLINICAL IMPRESSIONS 07/02/2020 Clinical Impression Pt presented with oropharyngeal dysphagia characterized by impaired bolus control and a pharyngeal delay. He demonstrated premature spillage to the pyriform sinuses, penetration (PAS 5) and aspiration (PAS 7, 8) of thin liquids via cup and straw. Pt was unable to demonstrate compensatory strategies despite verbal prompts and cues and aspiration persisted despite use of 5cc and 10cc boluses. Laryngeal invasion was improved to PAS 2 with nectar thick liquids which is considered WNL. Aspiration resulted in an ineffective cough on two occasions, but all other instances were silent. Per EMR, pt has required augmentative IV fluids every few days due to poor p.o. intake, and SLP anticipates that use of nectar thick liquids to eliminate aspiration would likely not improve this. It is anticipated that the pt has had intermittent aspiration of thin liquids throughout this 21-day admission. Pt's daughter, [name], was contacted via phone and she reported that the pt had also been  inconsistently symptomic of aspiration prior to admission so his aspiration may be chronic issue. Pt is ambulatory, afebrile, and all five of his CXRs have been negative for active disease. Pt's case was discussed with Dr. Waldron Labs including potential options for dysphagia management considering dehydration risk vs pulmonary risk. Considering his tolerance of intermittent aspiration thus far, it was agreed that his current diet will be continued with strict observance of swallowing precautions, consistent pt mobilization, and consistent oral care. Pt's daughter, [name], was educated regarding the pt's performance, concerns regarding potential for dehydration due to avoidance of liquids, and the potential progression of dysphagia in the setting of dementia. All of her questions were answered and she verbalized agreement with plan of  care. SLP Visit Diagnosis Dysphagia, oropharyngeal phase (R13.12) Attention and concentration deficit following -- Frontal lobe and executive function deficit following -- Impact on safety and function Mild aspiration risk;Moderate aspiration risk   CHL IP TREATMENT RECOMMENDATION 07/02/2020 Treatment Recommendations Therapy as outlined in treatment plan below   Prognosis 07/02/2020 Prognosis for Safe Diet Advancement Fair Barriers to Reach Goals Cognitive deficits Barriers/Prognosis Comment -- CHL IP DIET RECOMMENDATION 07/02/2020 SLP Diet Recommendations Dysphagia 3 (Mech soft) solids;Thin liquid Liquid Administration via Cup;No straw Medication Administration Whole meds with puree Compensations Minimize environmental distractions;Slow rate;Small sips/bites Postural Changes Seated upright at 90 degrees   CHL IP OTHER RECOMMENDATIONS 07/02/2020 Recommended Consults -- Oral Care Recommendations Oral care BID;Staff/trained caregiver to provide oral care Other Recommendations --   CHL IP FOLLOW UP RECOMMENDATIONS 07/02/2020 Follow up Recommendations Skilled Nursing facility   [facility] IP FREQUENCY AND  DURATION 07/02/2020 Speech Therapy Frequency (ACUTE ONLY) min 1 x/week Treatment Duration 2 weeks      CHL IP ORAL PHASE 07/02/2020 Oral Phase Impaired Oral - Pudding Teaspoon -- Oral - Pudding Cup -- Oral - Honey Teaspoon -- Oral - Honey Cup -- Oral - Nectar Teaspoon -- Oral - Nectar Cup -- Oral - Nectar Straw Premature spillage;Decreased bolus cohesion Oral - Thin Teaspoon -- Oral - Thin Cup Premature spillage;Decreased bolus cohesion Oral - Thin Straw Premature spillage;Decreased bolus cohesion Oral - Puree Decreased bolus cohesion;Premature spillage Oral - Mech Soft -- Oral - Regular WFL Oral - Multi-Consistency -- Oral - Pill Decreased bolus cohesion;Premature spillage Oral Phase - Comment --  CHL IP PHARYNGEAL PHASE 07/02/2020 Pharyngeal Phase Impaired Pharyngeal- Pudding Teaspoon -- Pharyngeal -- Pharyngeal- Pudding Cup -- Pharyngeal -- Pharyngeal- Honey Teaspoon -- Pharyngeal -- Pharyngeal- Honey Cup -- Pharyngeal -- Pharyngeal- Nectar Teaspoon -- Pharyngeal -- Pharyngeal- Nectar Cup Delayed swallow initiation-vallecula Pharyngeal -- Pharyngeal- Nectar Straw Delayed swallow initiation-vallecula;Delayed swallow initiation-pyriform sinuses;Penetration/Aspiration during swallow Pharyngeal Material enters airway, remains ABOVE vocal cords then ejected out Pharyngeal- Thin Teaspoon -- Pharyngeal -- Pharyngeal- Thin Cup Delayed swallow initiation-pyriform sinuses;Penetration/Aspiration during swallow;Penetration/Apiration after swallow Pharyngeal Material enters airway, CONTACTS cords and not ejected out;Material enters airway, passes BELOW cords and not ejected out despite cough attempt by patient;Material enters airway, passes BELOW cords without attempt by patient to eject out (silent aspiration) Pharyngeal- Thin Straw Delayed swallow initiation-pyriform sinuses;Penetration/Aspiration during swallow;Penetration/Apiration after swallow Pharyngeal Material enters airway, CONTACTS cords and not ejected out;Material  enters airway, passes BELOW cords without attempt by patient to eject out (silent aspiration);Material enters airway, passes BELOW cords and not ejected out despite cough attempt by patient Pharyngeal- Puree Delayed swallow initiation-vallecula Pharyngeal -- Pharyngeal- Mechanical Soft -- Pharyngeal -- Pharyngeal- Regular Delayed swallow initiation-vallecula Pharyngeal -- Pharyngeal- Multi-consistency -- Pharyngeal -- Pharyngeal- Pill Delayed swallow initiation-vallecula Pharyngeal -- Pharyngeal Comment --  CHL IP CERVICAL ESOPHAGEAL PHASE 07/02/2020 Cervical Esophageal Phase WFL Pudding Teaspoon -- Pudding Cup -- Honey Teaspoon -- Honey Cup -- Nectar Teaspoon -- Nectar Cup -- Nectar Straw -- Thin Teaspoon -- Thin Cup -- Thin Straw -- Puree -- Mechanical Soft -- Regular -- Multi-consistency -- Pill -- Cervical Esophageal Comment -- Shanika I. Hardin Negus, [place], [place] Office number [phone] Pager [phone] Horton Marshall 07/02/2020, 3:23 PM              EEG adult  Result Date: 06/29/2020 Lora Havens, MD     06/29/2020  1:23 PM Patient Name: Patrick Phillips MRN: [id number] Epilepsy Attending: Lora Havens Referring Physician/Provider: Dr Amie [place] Date:  06/29/2020 Duration: 21.43 mins Patient history: 80 year old male with waxing and waning mental status.  EEG to evaluate for seizures. Level of alertness: Awake AEDs during EEG study: Depakote Technical aspects: This EEG study was done with scalp electrodes positioned according to the 10-20 International system of electrode placement. Electrical activity was acquired at a sampling rate of 500Hz  and reviewed with a high frequency filter of 70Hz  and a low frequency filter of 1Hz . EEG data were recorded continuously and digitally stored. Description: The posterior dominant rhythm consists of 8-9 Hz activity of moderate voltage (25-35 uV) seen predominantly in posterior head regions, symmetric and reactive to eye opening and eye  closing. EEG showed intermittent generalized 5 to 6 Hz theta slowing. Hyperventilation and photic stimulation were not performed.   ABNORMALITY - Intermittent slow, generalized IMPRESSION: This study is suggestive of mild diffuse encephalopathy, nonspecific etiology. No seizures or epileptiform discharges were seen throughout the recording. Lora Havens   EEG adult  Result Date: 06/12/2020 Lora Havens, MD     06/12/2020  3:07 PM Patient Name: Patrick Phillips MRN: [id number] Epilepsy Attending: Lora Havens Referring Physician/Provider: Dr Oren Binet Date: 06/12/2020 Duration: 25.42 mins Patient history: 80yo M with ams. EEG to evaluate for seizure Level of alertness: Awake AEDs during EEG study: None Technical aspects: This EEG study was done with scalp electrodes positioned according to the 10-20 International system of electrode placement. Electrical activity was acquired at a sampling rate of 500Hz  and reviewed with a high frequency filter of 70Hz  and a low frequency filter of 1Hz . EEG data were recorded continuously and digitally stored. Description: No posterior dominant rhythm was seen. EEG showed continuous generalized 5 to 6 Hz theta slowing. Hyperventilation and photic stimulation were not performed.   ABNORMALITY - Continuous slow, generalized IMPRESSION: This study is suggestive of mild to moderate diffuse encephalopathy, nonspecific etiology. No seizures or epileptiform discharges were seen throughout the recording. Priyanka Barbra Sarks   CT HEAD CODE STROKE WO CONTRAST`  Result Date: 06/28/2020 CLINICAL DATA:  Code stroke.  Confusion.  Altered mental status. EXAM: CT HEAD WITHOUT CONTRAST TECHNIQUE: Contiguous axial images were obtained from the base of the skull through the vertex without intravenous contrast. COMPARISON:  MRI 06/10/2020 FINDINGS: Brain: Age related volume loss. No evidence of old or acute small or large vessel infarction. No mass lesion, hemorrhage, hydrocephalus or  extra-axial collection. Dilated perivascular space at the base of the brain. Vascular: There is atherosclerotic calcification of the major vessels at the base of the brain. Skull: Negative Sinuses/Orbits: Clear/normal Other: None ASPECTS ([place] Stroke Program Early CT Score) - Ganglionic level infarction (caudate, lentiform nuclei, internal capsule, insula, M1-M3 cortex): 7 - Supraganglionic infarction (M4-M6 cortex): 3 Total score (0-10 with 10 being normal): 10 IMPRESSION: 1. Normal head CT for age. Atherosclerotic calcification of the major vessels at base of the brain. 2. ASPECTS is 10 3. These results were communicated to Dr. Rory Percy at 7:18 pmon 5/2/2022by text page via the [facility] messaging system. Electronically Signed   By: Nelson Chimes M.D.   On: 06/28/2020 19:19

## 2020-07-13 LAB — GLUCOSE, CAPILLARY
Glucose-Capillary: 162 mg/dL — ABNORMAL HIGH (ref 70–99)
Glucose-Capillary: 189 mg/dL — ABNORMAL HIGH (ref 70–99)
Glucose-Capillary: 305 mg/dL — ABNORMAL HIGH (ref 70–99)
Glucose-Capillary: 159 mg/dL — ABNORMAL HIGH (ref 70–99)
Glucose-Capillary: 165 mg/dL — ABNORMAL HIGH (ref 70–99)

## 2020-07-13 NOTE — Progress Notes (Signed)
Nutrition Follow-up  DOCUMENTATION CODES:   Non-severe (moderate) malnutrition in context of acute illness/injury  INTERVENTION:   -Continue Ensure Enlive po BID, each supplement provides 350 kcal and 20 grams of protein -Continue Magic cup BID with meals, each supplement provides 290 kcal and 9 grams of protein -Continue MVI with minerals daily  NUTRITION DIAGNOSIS:   Moderate Malnutrition related to acute illness (COVID) as evidenced by mild fat depletion,mild muscle depletion.  Ongoing  GOAL:   Patient will meet greater than or equal to 90% of their needs  Progressing   MONITOR:   PO intake,Labs,Weight trends,I & O's  REASON FOR ASSESSMENT:   Malnutrition Screening Tool    ASSESSMENT:   Pt admitted with acute metabolic encephalopathy, likely 2/2 febrile illness/COVID-19 infection. PMH includes HTN, DM, Ca, dementia.  Reviewed I/O's: +219 ml x 24 hours and +653 ml since 06/29/20  UOP: 501 ml x 24 hours  Pt continues with good appetite. Noted meal completion 75-100%. Pt continues to consume Ensure Enlive supplements.   Medications reviewed and include senokot, thiamine, and vitamin B-12.   Per TOC notes, pt continues to await geri psych/ SNF placement.   Labs reviewed: CBGS: 162-403 (inpatient orders for glycemic control are 0-20 units insulin aspart TID with meals and at bedtime).   Diet Order:   Diet Order            Diet Carb Modified Fluid consistency: Thin; Room service appropriate? Yes  Diet effective now                 EDUCATION NEEDS:   No education needs have been identified at this time  Skin:  Skin Assessment: Skin Integrity Issues: Skin Integrity Issues:: Other (Comment) Other: MASD to anus, buttocks, scrotum; non pressure wound to L elbow  Last BM:  07/12/20  Height:   Ht Readings from Last 1 Encounters:  06/10/20 6' (1.829 m)    Weight:   Wt Readings from Last 1 Encounters:  07/11/20 86.4 kg   BMI:  Body mass index is 25.83  kg/m.  Estimated Nutritional Needs:   Kcal:  1950-2150  Protein:  100-110 grams  Fluid:  >1.95L/d    Loistine Chance, RD, LDN, [place] Registered Dietitian II Certified Diabetes Care and Education Specialist Please refer to [facility] for RD and/or RD on-call/weekend/after hours pager

## 2020-07-13 NOTE — Progress Notes (Signed)
PROGRESS NOTE                                                                                                                                                                                                             Patient Demographics:    Patrick Phillips, is a 80 y.o. male, DOB - 03-Sep-1940, [id number]  Outpatient Primary MD for the patient is [name], MD   Admit date - 06/10/2020   LOS - 49  Chief Complaint  Patient presents with  . Altered Mental Status       Brief Narrative: Patient is a 80 y.o. male with PMHx of vascular dementia, prior CVA, DM-2, HTN-who presented with fever and confusion.  He was thought to have COVID-19 infection-and acute metabolic encephalopathy related to COVID-19 infection.  Hospital course complicated by persistent delirium.  See below for further details.  COVID-19 vaccinated status: Unknown  Significant Events: 4/14>> Admit to [facility] for fever/confusion  Significant studies: 4/14>>Chest x-ray: No PNA 4/14>> CT head: No acute intracranial abnormality. 4/14>> CT C-spine: No fracture 4/14>> MRI brain: No acute infarct. 4/15>> chest x-ray: No PNA 4/15>> CSF: WBC 1 (tube #4), protein 46, glucose 54 4/16>> EEG: Mild to moderate diffuse encephalopathy-no seizures.  COVID-19 medications: Remdesivir: 4/15>>4/17  Antibiotics: Vancomycin: 4/14 x1 Ceftriaxone: 4/14 x 1 Ampicillin: 4/14 x 1 Doxy: 4/22>> 4/26   Microbiology data: 4/14 >>blood culture: No growth 4/14>> urine culture: No growth 4/15>> blood culture: No growth 4/15>> urine culture: Multiple species 4/15>> CSF culture: No growth 4/15>> CSF PCR HSV 1/2: Negative. 4/15>> CSF HSV culture: Negative 4/15>> CSF arbovirus IgG: Negative 4/15>> CSF VDRL: Negative RMSF CSF serology IgM -ve  Procedures:  4/15>> lumbar puncture in the emergency room  Consults:  Neurology, psychiatry, Pall.Care  DVT prophylaxis:   enoxaparin (LOVENOX) injection 40 mg Start: 06/11/20 0600    Subjective:   Pleasantly confused-eating breakfast this morning when I saw him.   Assessment  & Plan :   Acute metabolic encephalopathy-superimposed on underlying Dementia with delirium: Initially encephalopathy was due to COVID-19 infection-however lately his altered mental status is more consistent with delirium related to dementia.  Neurology/psychiatry  consulted-however he continues to have waxing and waning delirium.  Currently remains stable on Seroquel and Depakote.  Awaiting Geri psych/SNF placement.  ? Tick Bite: 2 ticks found by nursing staff on patient 06/17/20 - RMSF IgM - ve, DW ID Dr Tommy Medal 06/23/20- not acute, no Rx.  AKI: Mild-likely hemodynamically mediated-resolved with IV fluids.  Dysphagia:-Patient followed closely by SLP, had MBS done 5/8, previously on dysphagia 3 diet-prior MD discussed with family-has been upgraded to a regular diet.  Borderline vitamin B12 deficiency: On supplementation.  Hypokalemia: Repleted-recheck periodically.  HTN: BP better-continue hydralazine, Coreg and amlodipine.  We will follow and adjust.    History of CVA: MRI brain negative x2-on aspirin/statin.  Dementia with delirium: See above  Moderate protein calorie malnutrition  GERD: On PPI  SIRS-COVID-19 infection: Completed Remdesivir x3 days.  Not hypoxic-COVID-19 isolation completed on 4/25.   No results for input(s): DDIMER, FERRITIN, LDH, CRP in the last 72 hours.  Lab Results  Component Value Date   [place] (A) 06/11/2020   [place] NEGATIVE 01/13/2020   DM-2: CBGs relatively stable-continue SSI-oral hypoglycemic agents on hold  Recent Labs    07/12/20 2058 07/13/20 0649 07/13/20 0817  GLUCAP 403* 162* 189*     Condition -   Guarded  Family Communication  : None at bedside  Code Status :  DNR  Diet :  Diet Order            Diet Carb Modified Fluid consistency: Thin; Room service  appropriate? Yes  Diet effective now                  Disposition Plan  :   Status is: Inpatient  Remains inpatient appropriate because:Inpatient level of care appropriate due to severity of illness   Dispo: The patient is from: Home              Anticipated d/c is to: SNF              Patient currently is medically stable to d/c. awaiting SNF bed availability   Difficult to place patient No   Barriers to discharge: Awaiting SNF/Geri psych bed.  Antimicorbials  :    Anti-infectives (From admission, onward)   Start     Dose/Rate Route Frequency Ordered Stop   06/18/20 1745  doxycycline (VIBRAMYCIN) 100 mg in sodium chloride 0.9 % 250 mL IVPB  Status:  Discontinued        100 mg 125 mL/hr over 120 Minutes Intravenous Every 12 hours 06/18/20 1648 06/23/20 0847   06/12/20 1000  remdesivir 100 mg in sodium chloride 0.9 % 100 mL IVPB        100 mg 200 mL/hr over 30 Minutes Intravenous Daily 06/11/20 0435 06/13/20 0900   06/11/20 0445  remdesivir 200 mg in sodium chloride 0.9% 250 mL IVPB        200 mg 580 mL/hr over 30 Minutes Intravenous Once 06/11/20 0434 06/11/20 0929   06/11/20 0230  vancomycin (VANCOREADY) IVPB 1500 mg/300 mL        1,500 mg 150 mL/hr over 120 Minutes Intravenous  Once 06/11/20 0229 06/11/20 0812   06/11/20 0215  ampicillin (OMNIPEN) 2 g in sodium chloride 0.9 % 100 mL IVPB        2 g 300 mL/hr over 20 Minutes Intravenous  Once 06/11/20 0157 06/11/20 0550   06/11/20 0215  vancomycin (VANCOREADY) IVPB 1000 mg/200 mL  Status:  Discontinued        1,000  mg 200 mL/hr over 60 Minutes Intravenous  Once 06/11/20 0157 06/11/20 0229   06/11/20 0200  cefTRIAXone (ROCEPHIN) 2 g in sodium chloride 0.9 % 100 mL IVPB        2 g 200 mL/hr over 30 Minutes Intravenous  Once 06/11/20 0157 06/11/20 0248      Inpatient Medications  Scheduled Meds: . amLODipine  10 mg Oral Daily  . aspirin EC  81 mg Oral Daily  . atorvastatin  40 mg Oral Daily  . carvedilol  6.25 mg  Oral BID WC  . divalproex  250 mg Oral Q12H  . enoxaparin (LOVENOX) injection  40 mg Subcutaneous Q24H  . feeding supplement  237 mL Oral BID BM  . fluticasone  2 spray Each Nare Daily  . hydrALAZINE  100 mg Oral Q8H  . hydrocortisone   Rectal BID  . insulin aspart  0-20 Units Subcutaneous TID AC & HS  . multivitamin with minerals  1 tablet Oral Daily  . naLOXone (NARCAN)  injection  0.4 mg Intravenous Once  . pantoprazole  40 mg Oral Daily  . QUEtiapine  50 mg Oral Q2000  . senna  2 tablet Oral Daily  . tamsulosin  0.4 mg Oral Daily  . thiamine  100 mg Oral Daily  . vitamin B-12  1,000 mcg Oral Daily   Continuous Infusions:  PRN Meds:.acetaminophen, haloperidol lactate, hydrALAZINE, [DISCONTINUED] ondansetron **OR** ondansetron (ZOFRAN) IV, polyethylene glycol, sodium chloride   See all Orders from today for further details   Oren Binet M.D on 07/13/2020 at 12:00 PM  To page go to [email or web address] - use universal password  [facility] -  Office  [phone]    Objective:   Vitals:   07/12/20 1623 07/12/20 1949 07/12/20 2101 07/13/20 0548  BP: 134/65 (!) 152/59 (!) 153/65 (!) 156/70  Pulse:  80 82 85  Resp:  18 18 18   Temp:  98.2 F (36.8 C) 97.9 F (36.6 C) 97.8 F (36.6 C)  TempSrc:  Oral Oral Oral  SpO2:  94% 98% 98%  Weight:      Height:        Wt Readings from Last 3 Encounters:  07/11/20 86.4 kg  06/10/20 86.2 kg  04/19/20 86.2 kg     Intake/Output Summary (Last 24 hours) at 07/13/2020 1200 Last data filed at 07/13/2020 0549 Gross per 24 hour  Intake 480 ml  Output 501 ml  Net -21 ml     Physical Exam Gen Exam: Pleasantly confused HEENT:atraumatic, normocephalic Chest: B/L clear to auscultation anteriorly CVS:S1S2 regular Abdomen:soft non tender, non distended Extremities:no edema Neurology: Non focal Skin: no rash     Data Review:    CBC Recent Labs  Lab 07/11/20 0804  WBC 13.9*  HGB 12.8*  HCT 38.7*  PLT 268  MCV  86.6  MCH 28.6  MCHC 33.1  RDW 14.1    Chemistries  Recent Labs  Lab 07/11/20 0804  NA 138  K 4.0  CL 106  CO2 24  GLUCOSE 238*  BUN 16  CREATININE 0.98  CALCIUM 9.5  AST 28  ALT 29  ALKPHOS 45  BILITOT 0.4   ------------------------------------------------------------------------------------------------------------------ No results for input(s): CHOL, HDL, LDLCALC, TRIG, CHOLHDL, LDLDIRECT in the last 72 hours.  Lab Results  Component Value Date   HGBA1C 6.6 (H) 06/11/2020   ------------------------------------------------------------------------------------------------------------------ No results for input(s): TSH, T4TOTAL, T3FREE, THYROIDAB in the last 72 hours.  Invalid input(s): FREET3 ------------------------------------------------------------------------------------------------------------------ No results for input(s): VITAMINB12, FOLATE, FERRITIN, TIBC,  IRON, RETICCTPCT in the last 72 hours.  Coagulation profile No results for input(s): INR, PROTIME in the last 168 hours.  No results for input(s): DDIMER in the last 72 hours.  Cardiac Enzymes No results for input(s): CKMB, TROPONINI, MYOGLOBIN in the last 168 hours.  Invalid input(s): CK ------------------------------------------------------------------------------------------------------------------    Component Value Date/Time   BNP 39.4 06/28/2020 0044    Micro Results No results found for this or any previous visit (from the past 240 hour(s)).  Radiology Reports MR BRAIN WO CONTRAST  Result Date: 06/29/2020 CLINICAL DATA:  Right-sided facial drooping, altered mental status. EXAM: MRI HEAD WITHOUT CONTRAST TECHNIQUE: Multiplanar, multiecho pulse sequences of the brain and surrounding structures were obtained without intravenous contrast. COMPARISON:  June 10, 2020. FINDINGS: Brain: No acute infarction, hemorrhage, hydrocephalus, extra-axial collection or mass lesion. Dilated perivascular space  in inferior right basal ganglia. No substantial white matter disease for patient age. Vascular: Partial loss of flow void in the distal left vertebral artery, which could be due to stenosis. Normal flow void in the visualized proximal aICAs. Skull and upper cervical spine: Normal marrow signal. Sinuses/Orbits: Mild mucosal thickening of the frontal sinuses and moderate mucosal thickening of scattered ethmoid air cells. Inferior left maxillary sinus mucous retention cyst. Bilateral exopthalmos, similar to prior. Other: No mastoid effusions. IMPRESSION: 1. No evidence of acute abnormality. 2. Partial loss of flow void in the distal left vertebral artery, similar to prior and possibly due to stenosis. Consider CT angio head and neck to further evaluate. 3. Frontoethmoidal paranasal sinus mucosal thickening. Electronically Signed   By: Margaretha Sheffield MD   On: 06/29/2020 07:47   DG CHEST PORT 1 VIEW  Result Date: 07/04/2020 CLINICAL DATA:  Dyspnea EXAM: PORTABLE CHEST 1 VIEW COMPARISON:  06/25/2020 FINDINGS: Cardiac shadow is within normal limits. The lungs are clear bilaterally. Loop recorder is noted on the left. No bony abnormality is seen. IMPRESSION: No acute abnormality noted. Electronically Signed   By: Inez Catalina M.D.   On: 07/04/2020 21:41   DG Chest Port 1 View  Result Date: 06/25/2020 CLINICAL DATA:  Shortness of breath and lethargy. EXAM: PORTABLE CHEST 1 VIEW COMPARISON:  Chest x-ray 06/24/2020 FINDINGS: The cardiac silhouette, mediastinal and hilar contours are within limits. Stable mild tortuosity and calcification the thoracic aorta. The lungs are clear of an acute process. No pleural effusions or pulmonary lesions. The bony thorax is intact. IMPRESSION: No acute cardiopulmonary findings. Electronically Signed   By: Marijo Sanes M.D.   On: 06/25/2020 14:15   DG Chest Port 1 View  Result Date: 06/24/2020 CLINICAL DATA:  Shortness of breath. EXAM: PORTABLE CHEST 1 VIEW COMPARISON:  June 20, 2020. FINDINGS: The heart size and mediastinal contours are within normal limits. Both lungs are clear. The visualized skeletal structures are unremarkable. IMPRESSION: No active disease. Electronically Signed   By: Marijo Conception M.D.   On: 06/24/2020 08:13   DG Chest Port 1 View  Result Date: 06/20/2020 CLINICAL DATA:  Shortness of breath EXAM: PORTABLE CHEST 1 VIEW COMPARISON:  06/11/2020 FINDINGS: Loop recorder is identified adjacent to the cardiac apex. Normal heart size. No pleural effusion or edema. No airspace densities identified. No acute osseous findings. IMPRESSION: No acute cardiopulmonary abnormalities. Electronically Signed   By: Kerby Moors M.D.   On: 06/20/2020 08:34   DG Swallowing Func-Speech Pathology  Result Date: 07/02/2020 Objective Swallowing Evaluation: Type of Study: Bedside Swallow Evaluation  Patient Details Name: Nox Talent MRN: [id number] Date of  Birth: 1940-07-02 Today's Date: 07/02/2020 Time: SLP Start Time (ACUTE ONLY): 1230 -SLP Stop Time (ACUTE ONLY): 1249 SLP Time Calculation (min) (ACUTE ONLY): 19 min Past Medical History: Past Medical History: Diagnosis Date . Cancer ([place])   skin cancer . Dementia ([place])  . Diabetes mellitus without complication ([place])  . Hypertension  . Memory deficit  Past Surgical History: Past Surgical History: Procedure Laterality Date . BACK SURGERY   . HERNIA REPAIR   . LOOP RECORDER INSERTION N/A 07/26/2017  Procedure: LOOP RECORDER INSERTION;  Surgeon: Evans Lance, MD;  Location: [place] [facility];  Service: Cardiovascular;  Laterality: N/A; . TEE WITHOUT CARDIOVERSION N/A 07/26/2017  Procedure: TRANSESOPHAGEAL ECHOCARDIOGRAM (TEE);  Surgeon: Fay Records, MD;  Location[facility];  Service: Cardiovascular;  Laterality: N/A; . TRIGGER FINGER RELEASE Right 12/02/2015  Procedure: RIGHT THUMB RELEASE TRIGGER FINGER/A-1 PULLEY;  Surgeon: Leanora Cover, MD;  Location: [place];  Service: Orthopedics;  Laterality: Right; HPI: Pt  is a 80 y.o. male with PMHx of vascular dementia, CVA, DM-2, HTN. On 4/14, pt took his dog for a walk and the dog returned home without him. Pt was found lying on the side of road with no memory of what had happened but able to follow commands. Pt was taken to [facility] and d/c home with daughter following negative workup, but pt was brough to [facility] ED due to increased confusion. Pt found to be febrile and COVID+ and admitted for acute metabolic encephalopathy. MRI brain 4/14 negative for acute changes. CXR 4/15 negative for active disease. EEG 4/16: mild to moderate diffuse encephalopathy, nonspecific etiology. No seizures or epileptiform discharges. Psych consulted due to severe delirium.  No data recorded Assessment / Plan / Recommendation CHL IP CLINICAL IMPRESSIONS 07/02/2020 Clinical Impression Pt presented with oropharyngeal dysphagia characterized by impaired bolus control and a pharyngeal delay. He demonstrated premature spillage to the pyriform sinuses, penetration (PAS 5) and aspiration (PAS 7, 8) of thin liquids via cup and straw. Pt was unable to demonstrate compensatory strategies despite verbal prompts and cues and aspiration persisted despite use of 5cc and 10cc boluses. Laryngeal invasion was improved to PAS 2 with nectar thick liquids which is considered WNL. Aspiration resulted in an ineffective cough on two occasions, but all other instances were silent. Per EMR, pt has required augmentative IV fluids every few days due to poor p.o. intake, and SLP anticipates that use of nectar thick liquids to eliminate aspiration would likely not improve this. It is anticipated that the pt has had intermittent aspiration of thin liquids throughout this 21-day admission. Pt's daughter, [name], was contacted via phone and she reported that the pt had also been inconsistently symptomic of aspiration prior to admission so his aspiration may be chronic issue. Pt is ambulatory, afebrile, and all five of his CXRs have been  negative for active disease. Pt's case was discussed with Dr. Waldron Labs including potential options for dysphagia management considering dehydration risk vs pulmonary risk. Considering his tolerance of intermittent aspiration thus far, it was agreed that his current diet will be continued with strict observance of swallowing precautions, consistent pt mobilization, and consistent oral care. Pt's daughter, [name], was educated regarding the pt's performance, concerns regarding potential for dehydration due to avoidance of liquids, and the potential progression of dysphagia in the setting of dementia. All of her questions were answered and she verbalized agreement with plan of care. SLP Visit Diagnosis Dysphagia, oropharyngeal phase (R13.12) Attention and concentration deficit following -- Frontal lobe and  executive function deficit following -- Impact on safety and function Mild aspiration risk;Moderate aspiration risk   CHL IP TREATMENT RECOMMENDATION 07/02/2020 Treatment Recommendations Therapy as outlined in treatment plan below   Prognosis 07/02/2020 Prognosis for Safe Diet Advancement Fair Barriers to Reach Goals Cognitive deficits Barriers/Prognosis Comment -- CHL IP DIET RECOMMENDATION 07/02/2020 SLP Diet Recommendations Dysphagia 3 (Mech soft) solids;Thin liquid Liquid Administration via Cup;No straw Medication Administration Whole meds with puree Compensations Minimize environmental distractions;Slow rate;Small sips/bites Postural Changes Seated upright at 90 degrees   CHL IP OTHER RECOMMENDATIONS 07/02/2020 Recommended Consults -- Oral Care Recommendations Oral care BID;Staff/trained caregiver to provide oral care Other Recommendations --   CHL IP FOLLOW UP RECOMMENDATIONS 07/02/2020 Follow up Recommendations Skilled Nursing facility   [facility] IP FREQUENCY AND DURATION 07/02/2020 Speech Therapy Frequency (ACUTE ONLY) min 1 x/week Treatment Duration 2 weeks      CHL IP ORAL PHASE 07/02/2020 Oral Phase Impaired Oral - Pudding  Teaspoon -- Oral - Pudding Cup -- Oral - Honey Teaspoon -- Oral - Honey Cup -- Oral - Nectar Teaspoon -- Oral - Nectar Cup -- Oral - Nectar Straw Premature spillage;Decreased bolus cohesion Oral - Thin Teaspoon -- Oral - Thin Cup Premature spillage;Decreased bolus cohesion Oral - Thin Straw Premature spillage;Decreased bolus cohesion Oral - Puree Decreased bolus cohesion;Premature spillage Oral - Mech Soft -- Oral - Regular WFL Oral - Multi-Consistency -- Oral - Pill Decreased bolus cohesion;Premature spillage Oral Phase - Comment --  CHL IP PHARYNGEAL PHASE 07/02/2020 Pharyngeal Phase Impaired Pharyngeal- Pudding Teaspoon -- Pharyngeal -- Pharyngeal- Pudding Cup -- Pharyngeal -- Pharyngeal- Honey Teaspoon -- Pharyngeal -- Pharyngeal- Honey Cup -- Pharyngeal -- Pharyngeal- Nectar Teaspoon -- Pharyngeal -- Pharyngeal- Nectar Cup Delayed swallow initiation-vallecula Pharyngeal -- Pharyngeal- Nectar Straw Delayed swallow initiation-vallecula;Delayed swallow initiation-pyriform sinuses;Penetration/Aspiration during swallow Pharyngeal Material enters airway, remains ABOVE vocal cords then ejected out Pharyngeal- Thin Teaspoon -- Pharyngeal -- Pharyngeal- Thin Cup Delayed swallow initiation-pyriform sinuses;Penetration/Aspiration during swallow;Penetration/Apiration after swallow Pharyngeal Material enters airway, CONTACTS cords and not ejected out;Material enters airway, passes BELOW cords and not ejected out despite cough attempt by patient;Material enters airway, passes BELOW cords without attempt by patient to eject out (silent aspiration) Pharyngeal- Thin Straw Delayed swallow initiation-pyriform sinuses;Penetration/Aspiration during swallow;Penetration/Apiration after swallow Pharyngeal Material enters airway, CONTACTS cords and not ejected out;Material enters airway, passes BELOW cords without attempt by patient to eject out (silent aspiration);Material enters airway, passes BELOW cords and not ejected out despite  cough attempt by patient Pharyngeal- Puree Delayed swallow initiation-vallecula Pharyngeal -- Pharyngeal- Mechanical Soft -- Pharyngeal -- Pharyngeal- Regular Delayed swallow initiation-vallecula Pharyngeal -- Pharyngeal- Multi-consistency -- Pharyngeal -- Pharyngeal- Pill Delayed swallow initiation-vallecula Pharyngeal -- Pharyngeal Comment --  CHL IP CERVICAL ESOPHAGEAL PHASE 07/02/2020 Cervical Esophageal Phase WFL Pudding Teaspoon -- Pudding Cup -- Honey Teaspoon -- Honey Cup -- Nectar Teaspoon -- Nectar Cup -- Nectar Straw -- Thin Teaspoon -- Thin Cup -- Thin Straw -- Puree -- Mechanical Soft -- Regular -- Multi-consistency -- Pill -- Cervical Esophageal Comment -- Shanika I. Hardin Negus, [place], Home Office number [phone] Pager [phone] Horton Marshall 07/02/2020, 3:23 PM              EEG adult  Result Date: 06/29/2020 Lora Havens, MD     06/29/2020  1:23 PM Patient Name: Liston Thum MRN: [id number] Epilepsy Attending: Lora Havens Referring Physician/Provider: Dr Amie [place] Date: 06/29/2020 Duration: 21.43 mins Patient history: 80 year old male with waxing and waning mental status.  EEG to  evaluate for seizures. Level of alertness: Awake AEDs during EEG study: Depakote Technical aspects: This EEG study was done with scalp electrodes positioned according to the 10-20 International system of electrode placement. Electrical activity was acquired at a sampling rate of 500Hz  and reviewed with a high frequency filter of 70Hz  and a low frequency filter of 1Hz . EEG data were recorded continuously and digitally stored. Description: The posterior dominant rhythm consists of 8-9 Hz activity of moderate voltage (25-35 uV) seen predominantly in posterior head regions, symmetric and reactive to eye opening and eye closing. EEG showed intermittent generalized 5 to 6 Hz theta slowing. Hyperventilation and photic stimulation were not performed.   ABNORMALITY - Intermittent slow,  generalized IMPRESSION: This study is suggestive of mild diffuse encephalopathy, nonspecific etiology. No seizures or epileptiform discharges were seen throughout the recording. Priyanka Barbra Sarks   CT HEAD CODE STROKE WO CONTRAST`  Result Date: 06/28/2020 CLINICAL DATA:  Code stroke.  Confusion.  Altered mental status. EXAM: CT HEAD WITHOUT CONTRAST TECHNIQUE: Contiguous axial images were obtained from the base of the skull through the vertex without intravenous contrast. COMPARISON:  MRI 06/10/2020 FINDINGS: Brain: Age related volume loss. No evidence of old or acute small or large vessel infarction. No mass lesion, hemorrhage, hydrocephalus or extra-axial collection. Dilated perivascular space at the base of the brain. Vascular: There is atherosclerotic calcification of the major vessels at the base of the brain. Skull: Negative Sinuses/Orbits: Clear/normal Other: None ASPECTS ([place] Stroke Program Early CT Score) - Ganglionic level infarction (caudate, lentiform nuclei, internal capsule, insula, M1-M3 cortex): 7 - Supraganglionic infarction (M4-M6 cortex): 3 Total score (0-10 with 10 being normal): 10 IMPRESSION: 1. Normal head CT for age. Atherosclerotic calcification of the major vessels at base of the brain. 2. ASPECTS is 10 3. These results were communicated to Dr. Rory Percy at 7:18 pmon 5/2/2022by text page via the [facility] messaging system. Electronically Signed   By: Nelson Chimes M.D.   On: 06/28/2020 19:19

## 2020-07-13 NOTE — TOC Progression Note (Signed)
Transition of Care [facility]) - Progression Note    Patient Details  Name: Patrick Phillips MRN: [id number] Date of Birth: [birth year]  [facility] CM/SW [place], LCSW Phone Number: 07/13/2020, 12:58 PM  Clinical Narrative:    CSW spoke with patient's daughter Patrick Phillips and provided an update. MD to order COVID vaccine (please contact Misty for consent) per [facility] request. [facility] able to accept patient once TB test results.   Expected Discharge Plan: Memory Care Barriers to Discharge: Continued Medical Work up  Expected Discharge Plan and Services Expected Discharge Plan: Memory Care In-house Referral: Clinical Social Work Discharge Planning Services: CM Consult Post Acute Care Choice: Nursing Home Living arrangements for the past 2 months: Single Family Home Expected Discharge Date: 06/29/20                                     Social Determinants of Health (SDOH) Interventions    Readmission Risk Interventions No flowsheet data found.

## 2020-07-14 LAB — QUANTIFERON-TB GOLD PLUS: QuantiFERON-TB Gold Plus: NEGATIVE

## 2020-07-14 LAB — GLUCOSE, CAPILLARY
Glucose-Capillary: 227 mg/dL — ABNORMAL HIGH (ref 70–99)
Glucose-Capillary: 225 mg/dL — ABNORMAL HIGH (ref 70–99)
Glucose-Capillary: 219 mg/dL — ABNORMAL HIGH (ref 70–99)
Glucose-Capillary: 163 mg/dL — ABNORMAL HIGH (ref 70–99)

## 2020-07-14 LAB — QUANTIFERON-TB GOLD PLUS (RQFGPL)
QuantiFERON Mitogen Value: >10 IU/mL
QuantiFERON Nil Value: 0.03 IU/mL
QuantiFERON TB1 Ag Value: 0.01 IU/mL
QuantiFERON TB2 Ag Value: 0.01 IU/mL

## 2020-07-14 MED ORDER — TAMSULOSIN HCL 0.4 MG PO CAPS
0.4000 mg | ORAL_CAPSULE | Freq: Every day | ORAL | 0 refills | Status: AC
Start: 2020-07-14 — End: ?

## 2020-07-14 MED ORDER — SENNA 8.6 MG PO TABS: 2.0000 | ORAL_TABLET | Freq: Every day | ORAL | 0 refills | Status: DC

## 2020-07-14 MED ORDER — POLYETHYLENE GLYCOL 3350 17 G PO PACK: 17.0000 g | PACK | Freq: Every day | ORAL | 0 refills | Status: DC | PRN

## 2020-07-14 MED ORDER — METFORMIN HCL 1000 MG PO TABS: 1000.0000 mg | ORAL_TABLET | Freq: Two times a day (BID) | ORAL | 0 refills | Status: AC

## 2020-07-14 MED ORDER — AMLODIPINE BESYLATE 5 MG PO TABS: 10.0000 mg | ORAL_TABLET | Freq: Every day | ORAL | 0 refills | Status: DC

## 2020-07-14 MED ORDER — ATORVASTATIN CALCIUM 40 MG PO TABS: 40.0000 mg | ORAL_TABLET | Freq: Every day | ORAL | 0 refills | Status: DC

## 2020-07-14 MED ORDER — ESOMEPRAZOLE MAGNESIUM 40 MG PO CPDR: 40.0000 mg | DELAYED_RELEASE_CAPSULE | Freq: Every day | ORAL | 0 refills | Status: DC

## 2020-07-14 MED ORDER — HYDRALAZINE HCL 100 MG PO TABS: 100.0000 mg | ORAL_TABLET | Freq: Three times a day (TID) | ORAL | 0 refills | Status: AC

## 2020-07-14 MED ORDER — QUETIAPINE FUMARATE 50 MG PO TABS: 50.0000 mg | ORAL_TABLET | Freq: Every day | ORAL | 0 refills | Status: AC

## 2020-07-14 MED ORDER — CARVEDILOL 6.25 MG PO TABS: 6.2500 mg | ORAL_TABLET | Freq: Two times a day (BID) | ORAL | 0 refills | Status: AC

## 2020-07-14 MED ORDER — THIAMINE HCL 100 MG PO TABS: 100.0000 mg | ORAL_TABLET | Freq: Every day | ORAL | 0 refills | Status: AC

## 2020-07-14 MED ORDER — CYANOCOBALAMIN 1000 MCG PO TABS: 1000.0000 ug | ORAL_TABLET | Freq: Every day | ORAL | 0 refills | Status: DC

## 2020-07-14 MED ORDER — ENSURE ENLIVE PO LIQD: 237.0000 mL | Freq: Two times a day (BID) | ORAL | 0 refills | Status: AC

## 2020-07-14 MED ORDER — FLUTICASONE PROPIONATE 50 MCG/ACT NA SUSP: 2.0000 | Freq: Every day | NASAL | 0 refills | Status: DC

## 2020-07-14 MED ORDER — COVID-19 MRNA VACC (MODERNA) 100 MCG/0.5ML IM SUSP
0.5000 mL | Freq: Once | INTRAMUSCULAR | Status: AC
  Administered 2020-07-14: 0.5 mL via INTRAMUSCULAR
  Filled 2020-07-14: qty 0.5

## 2020-07-14 MED ORDER — LORAZEPAM 0.5 MG PO TABS: 0.5000 mg | ORAL_TABLET | Freq: Three times a day (TID) | ORAL | 0 refills | Status: DC | PRN

## 2020-07-14 MED ORDER — ASPIRIN EC 81 MG PO TBEC: 81.0000 mg | DELAYED_RELEASE_TABLET | Freq: Every day | ORAL | 0 refills | Status: AC

## 2020-07-14 MED ORDER — ADULT MULTIVITAMIN W/MINERALS CH: 1.0000 | ORAL_TABLET | Freq: Every day | ORAL | 0 refills | Status: AC

## 2020-07-14 MED ORDER — DIVALPROEX SODIUM 125 MG PO CSDR: 250.0000 mg | DELAYED_RELEASE_CAPSULE | Freq: Two times a day (BID) | ORAL | 0 refills | Status: DC

## 2020-07-14 NOTE — TOC Transition Note (Signed)
Transition of Care [facility]) - CM/SW Discharge Note   Patient Details  Name: Patrick Phillips MRN: [id number] Date of Birth: [birth year]  [facility] of [facility]) CM/SW Contact:  Benard Halsted, LCSW Phone Number: 07/14/2020, 1:00 PM   Clinical Narrative:    Patient will DC to: [place] date: 07/14/20 Family notified: Daughter, Engineer, manufacturing systems by: Corey Harold   Per MD patient ready for DC to [facility]. RN to call report prior to discharge ([phone]). RN, patient, patient's family, and facility notified of DC. Discharge Summary and FL2 sent to facility. DC packet on chart. Ambulance transport requested for patient.   CSW will sign off for now as social work intervention is no longer needed. Please consult [place] again if new needs arise.      Final next level of care: Memory Care Barriers to Discharge: Barriers Resolved   Patient Goals and CMS Choice Patient states their goals for this hospitalization and ongoing recovery are:: To feel better CMS Medicare.gov Compare Post Acute Care list provided to:: Patient Represenative (must comment) (Daughters) Choice offered to / list presented to : Adult Children  Discharge Placement              Patient chooses bed at: [facility] Patient to be transferred to facility by: [place] Name of family member notified: [name], daughter Patient and family notified of of transfer: 07/14/20  Discharge Plan and Services In-house Referral: Clinical Social Work Discharge Planning Services: CM Consult Post Acute Care Choice: Nursing Home                               Social Determinants of Health (SDOH) Interventions     Readmission Risk Interventions No flowsheet data found.

## 2020-07-14 NOTE — Discharge Summary (Addendum)
PATIENT DETAILS Name: Patrick Phillips Age: 80 y.o. Sex: male Date of Birth: [birth year] MRN: [id number]. Admitting Physician: Patrick Quill, DO [id number], Patrick Kindred, MD  Admit Date: 06/10/2020 Discharge date: 07/14/2020  Recommendations for Outpatient Follow-up:  1. Follow up with PCP in 1-2 weeks 2. Please obtain CMP/CBC in one week 3. Given the first dose of Moderna COVID-19 vaccine on 5/18-please dose second/booster dose accordingly.  Admitted From:  Home  Disposition: ALF   Home Health: No  Equipment/Devices: None  Discharge Condition: Stable  CODE STATUS: DNR  Diet recommendation:  Diet Order            Diet - low sodium heart healthy           Diet Carb Modified Fluid consistency: Thin; Room service appropriate? Yes  Diet effective now                  Brief Narrative: Patient is a 80 y.o. male with PMHx of vascular dementia, prior CVA, DM-2, HTN-who presented with fever and confusion.  He was thought to have COVID-19 infection-and acute metabolic encephalopathy related to COVID-19 infection.  Hospital course complicated by persistent delirium.  See below for further details.  COVID-19 vaccinated status: Unknown  Significant Events: 4/14>> Admit to [facility] for fever/confusion  Significant studies: 4/14>>Chest x-ray: No PNA 4/14>> CT head: No acute intracranial abnormality. 4/14>> CT C-spine: No fracture 4/14>> MRI brain: No acute infarct. 4/15>> chest x-ray: No PNA 4/15>> CSF: WBC 1 (tube #4), protein 46, glucose 54 4/16>> EEG: Mild to moderate diffuse encephalopathy-no seizures. 5/13>> TB Gold QuantiFERON: Negative  COVID-19 medications: Remdesivir: 4/15>>4/17  Antibiotics: Vancomycin: 4/14 x1 Ceftriaxone: 4/14 x 1 Ampicillin: 4/14 x 1 Doxy: 4/22>> 4/26  Microbiology data: 4/14 >>blood culture: No growth 4/14>> urine culture: No growth 4/15>> blood culture: No growth 4/15>> urine culture: Multiple species 4/15>> CSF culture: No  growth 4/15>> CSF PCR HSV 1/2: Negative. 4/15>> CSF HSV culture: Negative 4/15>> CSF arbovirus IgG: Negative 4/15>> CSF VDRL: Negative 4/22 >>RMSF CSF serology IgM -ve  Procedures:   4/15>> lumbar puncture in the emergency room  Consults:   Neurology, psychiatry, Pall.Care  Brief Hospital Course: Acute metabolic encephalopathy-superimposed on underlying Dementia with delirium: Initially encephalopathy was due to COVID-19 infection-however lately his altered mental status is more consistent with delirium related to dementia.  Neurology/psychiatry consulted-however he continues to have waxing and waning delirium.  Currently remains stable on Seroquel and Depakote.   ? Tick Bite: 2 ticks found by nursing staff on patient 06/17/20 - RMSF IgM - ve, DW ID Dr Patrick Phillips 06/23/20- not acute, no Rx.  AKI: Mild-likely hemodynamically mediated-resolved with IV fluids.  Dysphagia:-Patient followed closely by SLP, had MBS done 5/8, previously on dysphagia 3 diet-prior MD discussed with family-has been upgraded to a regular diet.  Borderline vitamin B12 deficiency: On supplementation.  Hypokalemia: Repleted-recheck periodically.  HTN: BP better-continue hydralazine, Coreg and amlodipine.  Please follow and adjust accordingly.  History of CVA: MRI brain negative x2-on aspirin/statin.  Dementia with delirium: See above  Moderate protein calorie malnutrition  GERD: On PPI  SIRS-COVID-19 infection: Completed Remdesivir x3 days.  Not hypoxic-COVID-19 isolation completed on 4/25.  After discussion with patient's daughter Patrick Phillips on 5/18-patient was given first dose of Moderna vaccine-please dose second and booster dose accordingly.     Discharge Diagnoses:  Principal Problem:   Acute metabolic encephalopathy Active Problems:   Hypertension   Diabetes mellitus without complication ([place])   [id number] virus infection  Vascular dementia ([place])   Malnutrition of moderate  degree   Discharge Instructions:  Activity:  As tolerated with Full fall precautions use walker/cane & assistance as needed   Discharge Instructions    Diet - low sodium heart healthy   Complete by: As directed    Discharge instructions   Complete by: As directed    Follow with Primary MD  Patrick Rio, MD in 1-2 weeks  Please get a complete blood count and chemistry panel checked by your Primary MD at your next visit, and again as instructed by your Primary MD.  Get Medicines reviewed and adjusted: Please take all your medications with you for your next visit with your Primary MD  Laboratory/radiological data: Please request your Primary MD to go over all hospital tests and procedure/radiological results at the follow up, please ask your Primary MD to get all Hospital records sent to his/her office.  In some cases, they will be blood work, cultures and biopsy results pending at the time of your discharge. Please request that your primary care M.D. follows up on these results.  Also Note the following: If you experience worsening of your admission symptoms, develop shortness of breath, life threatening emergency, suicidal or homicidal thoughts you must seek medical attention immediately by calling 911 or calling your MD immediately  if symptoms less severe.  You must read complete instructions/literature along with all the possible adverse reactions/side effects for all the Medicines you take and that have been prescribed to you. Take any new Medicines after you have completely understood and accpet all the possible adverse reactions/side effects.   Do not drive when taking Pain medications or sleeping medications (Benzodaizepines)  Do not take more than prescribed Pain, Sleep and Anxiety Medications. It is not advisable to combine anxiety,sleep and pain medications without talking with your primary care practitioner  Special Instructions: If you have smoked or chewed Tobacco   in the last 2 yrs please stop smoking, stop any regular Alcohol  and or any Recreational drug use.  Wear Seat belts while driving.  Please note: You were cared for by a hospitalist during your hospital stay. Once you are discharged, your primary care physician will handle any further medical issues. Please note that NO REFILLS for any discharge medications will be authorized once you are discharged, as it is imperative that you return to your primary care physician (or establish a relationship with a primary care physician if you do not have one) for your post hospital discharge needs so that they can reassess your need for medications and monitor your lab values.   Please check CBGs before meals and at bedtime.   Increase activity slowly   Complete by: As directed    No dressing needed   Complete by: As directed      Allergies as of 07/14/2020      Reactions   Aricept [donepezil]    Per daughter, increased mood swings and angry      Medication List    STOP taking these medications   DRY EYES OP   escitalopram 20 MG tablet Commonly known as: LEXAPRO   glipiZIDE 5 MG tablet Commonly known as: GLUCOTROL   meloxicam 15 MG tablet Commonly known as: MOBIC   memantine 10 MG tablet Commonly known as: NAMENDA     TAKE these medications   amLODipine 5 MG tablet Commonly known as: NORVASC Take 2 tablets (10 mg total) by mouth daily. What changed:   how much to  take  when to take this   aspirin EC 81 MG tablet Take 1 tablet (81 mg total) by mouth daily.   atorvastatin 40 MG tablet Commonly known as: LIPITOR Take 1 tablet (40 mg total) by mouth daily. What changed: Another medication with the same name was removed. Continue taking this medication, and follow the directions you see here.   carvedilol 6.25 MG tablet Commonly known as: COREG Take 1 tablet (6.25 mg total) by mouth 2 (two) times daily with a meal.   cyanocobalamin 1000 MCG tablet Take 1 tablet (1,000 mcg  total) by mouth daily.   divalproex 125 MG capsule Commonly known as: DEPAKOTE SPRINKLE Take 2 capsules (250 mg total) by mouth every 12 (twelve) hours.   esomeprazole 40 MG capsule Commonly known as: NEXIUM Take 1 capsule (40 mg total) by mouth daily.   feeding supplement Liqd Take 237 mLs by mouth 2 (two) times daily between meals.   fluticasone 50 MCG/ACT nasal spray Commonly known as: FLONASE Place 2 sprays into both nostrils daily.   hydrALAZINE 100 MG tablet Commonly known as: APRESOLINE Take 1 tablet (100 mg total) by mouth every 8 (eight) hours.   LORazepam 0.5 MG tablet Commonly known as: Ativan Take 1 tablet (0.5 mg total) by mouth every 8 (eight) hours as needed for anxiety or sedation.   metFORMIN 1000 MG tablet Commonly known as: GLUCOPHAGE Take 1 tablet (1,000 mg total) by mouth 2 (two) times daily.   multivitamin with minerals Tabs tablet Take 1 tablet by mouth daily.   polyethylene glycol 17 g packet Commonly known as: MIRALAX / GLYCOLAX Take 17 g by mouth daily as needed for mild constipation.   QUEtiapine 50 MG tablet Commonly known as: SEROQUEL Take 1 tablet (50 mg total) by mouth daily at 8 pm.   senna 8.6 MG Tabs tablet Commonly known as: SENOKOT Take 2 tablets (17.2 mg total) by mouth daily.   tamsulosin 0.4 MG Caps capsule Commonly known as: FLOMAX Take 1 capsule (0.4 mg total) by mouth daily.   thiamine 100 MG tablet Take 1 tablet (100 mg total) by mouth daily.            Discharge Care Instructions  (From admission, onward)         Start     Ordered   07/14/20 0000  No dressing needed        07/14/20 6578          Follow-up Information    Patrick Rio, MD. Schedule an appointment as soon as possible for a visit in 1 week(s).   Specialty: Family Medicine Contact information: [place] [id number] [phone]              Allergies  Allergen Reactions  . Aricept [Donepezil]     Per  daughter, increased mood swings and angry     Other Procedures/Studies: MR BRAIN WO CONTRAST  Result Date: 06/29/2020 CLINICAL DATA:  Right-sided facial drooping, altered mental status. EXAM: MRI HEAD WITHOUT CONTRAST TECHNIQUE: Multiplanar, multiecho pulse sequences of the brain and surrounding structures were obtained without intravenous contrast. COMPARISON:  June 10, 2020. FINDINGS: Brain: No acute infarction, hemorrhage, hydrocephalus, extra-axial collection or mass lesion. Dilated perivascular space in inferior right basal ganglia. No substantial white matter disease for patient age. Vascular: Partial loss of flow void in the distal left vertebral artery, which could be due to stenosis. Normal flow void in the visualized proximal aICAs. Skull and upper  cervical spine: Normal marrow signal. Sinuses/Orbits: Mild mucosal thickening of the frontal sinuses and moderate mucosal thickening of scattered ethmoid air cells. Inferior left maxillary sinus mucous retention cyst. Bilateral exopthalmos, similar to prior. Other: No mastoid effusions. IMPRESSION: 1. No evidence of acute abnormality. 2. Partial loss of flow void in the distal left vertebral artery, similar to prior and possibly due to stenosis. Consider CT angio head and neck to further evaluate. 3. Frontoethmoidal paranasal sinus mucosal thickening. Electronically Signed   By: Margaretha Sheffield MD   On: 06/29/2020 07:47   DG CHEST PORT 1 VIEW  Result Date: 07/04/2020 CLINICAL DATA:  Dyspnea EXAM: PORTABLE CHEST 1 VIEW COMPARISON:  06/25/2020 FINDINGS: Cardiac shadow is within normal limits. The lungs are clear bilaterally. Loop recorder is noted on the left. No bony abnormality is seen. IMPRESSION: No acute abnormality noted. Electronically Signed   By: Inez Catalina M.D.   On: 07/04/2020 21:41   DG Chest Port 1 View  Result Date: 06/25/2020 CLINICAL DATA:  Shortness of breath and lethargy. EXAM: PORTABLE CHEST 1 VIEW COMPARISON:  Chest x-ray  06/24/2020 FINDINGS: The cardiac silhouette, mediastinal and hilar contours are within limits. Stable mild tortuosity and calcification the thoracic aorta. The lungs are clear of an acute process. No pleural effusions or pulmonary lesions. The bony thorax is intact. IMPRESSION: No acute cardiopulmonary findings. Electronically Signed   By: Marijo Sanes M.D.   On: 06/25/2020 14:15   DG Chest Port 1 View  Result Date: 06/24/2020 CLINICAL DATA:  Shortness of breath. EXAM: PORTABLE CHEST 1 VIEW COMPARISON:  June 20, 2020. FINDINGS: The heart size and mediastinal contours are within normal limits. Both lungs are clear. The visualized skeletal structures are unremarkable. IMPRESSION: No active disease. Electronically Signed   By: Marijo Conception M.D.   On: 06/24/2020 08:13   DG Chest Port 1 View  Result Date: 06/20/2020 CLINICAL DATA:  Shortness of breath EXAM: PORTABLE CHEST 1 VIEW COMPARISON:  06/11/2020 FINDINGS: Loop recorder is identified adjacent to the cardiac apex. Normal heart size. No pleural effusion or edema. No airspace densities identified. No acute osseous findings. IMPRESSION: No acute cardiopulmonary abnormalities. Electronically Signed   By: Kerby Moors M.D.   On: 06/20/2020 08:34   DG Swallowing Func-Speech Pathology  Result Date: 07/02/2020 Objective Swallowing Evaluation: Type of Study: Bedside Swallow Evaluation  Patient Details Name: Jc Gindhart MRN: [id number] Date of Birth: 25-Mar-1940 Today's Date: 07/02/2020 Time: SLP Start Time (ACUTE ONLY): 58 -SLP Stop Time (ACUTE ONLY): 1249 SLP Time Calculation (min) (ACUTE ONLY): 19 min Past Medical History: Past Medical History: Diagnosis Date . Cancer ([place])   skin cancer . Dementia ([place])  . Diabetes mellitus without complication ([place])  . Hypertension  . Memory deficit  Past Surgical History: Past Surgical History: Procedure Laterality Date . BACK SURGERY   . HERNIA REPAIR   . LOOP RECORDER INSERTION N/A 07/26/2017  Procedure: LOOP RECORDER  INSERTION;  Surgeon: Evans Lance, MD;  Location: [facility];  Service: Cardiovascular;  Laterality: N/A; . TEE WITHOUT CARDIOVERSION N/A 07/26/2017  Procedure: TRANSESOPHAGEAL ECHOCARDIOGRAM (TEE);  Surgeon: Fay Records, MD;  Location: [facility];  Service: Cardiovascular;  Laterality: N/A; . TRIGGER FINGER RELEASE Right 12/02/2015  Procedure: RIGHT THUMB RELEASE TRIGGER FINGER/A-1 PULLEY;  Surgeon: Leanora Cover, MD;  Location: [facility];  Service: Orthopedics;  Laterality: Right; HPI: Pt is a 80 y.o. male with PMHx of vascular dementia, CVA, DM-2, HTN. On 4/14, pt took his dog  for a walk and the dog returned home without him. Pt was found lying on the side of road with no memory of what had happened but able to follow commands. Pt was taken to [facility] and d/c home with daughter following negative workup, but pt was brough to [facility] ED due to increased confusion. Pt found to be febrile and COVID+ and admitted for acute metabolic encephalopathy. MRI brain 4/14 negative for acute changes. CXR 4/15 negative for active disease. EEG 4/16: mild to moderate diffuse encephalopathy, nonspecific etiology. No seizures or epileptiform discharges. Psych consulted due to severe delirium.  No data recorded Assessment / Plan / Recommendation CHL IP CLINICAL IMPRESSIONS 07/02/2020 Clinical Impression Pt presented with oropharyngeal dysphagia characterized by impaired bolus control and a pharyngeal delay. He demonstrated premature spillage to the pyriform sinuses, penetration (PAS 5) and aspiration (PAS 7, 8) of thin liquids via cup and straw. Pt was unable to demonstrate compensatory strategies despite verbal prompts and cues and aspiration persisted despite use of 5cc and 10cc boluses. Laryngeal invasion was improved to PAS 2 with nectar thick liquids which is considered WNL. Aspiration resulted in an ineffective cough on two occasions, but all other instances were silent. Per EMR, pt has required augmentative IV  fluids every few days due to poor p.o. intake, and SLP anticipates that use of nectar thick liquids to eliminate aspiration would likely not improve this. It is anticipated that the pt has had intermittent aspiration of thin liquids throughout this 21-day admission. Pt's daughter, [name], was contacted via phone and she reported that the pt had also been inconsistently symptomic of aspiration prior to admission so his aspiration may be chronic issue. Pt is ambulatory, afebrile, and all five of his CXRs have been negative for active disease. Pt's case was discussed with Dr. Waldron Labs including potential options for dysphagia management considering dehydration risk vs pulmonary risk. Considering his tolerance of intermittent aspiration thus far, it was agreed that his current diet will be continued with strict observance of swallowing precautions, consistent pt mobilization, and consistent oral care. Pt's daughter, [name], was educated regarding the pt's performance, concerns regarding potential for dehydration due to avoidance of liquids, and the potential progression of dysphagia in the setting of dementia. All of her questions were answered and she verbalized agreement with plan of care. SLP Visit Diagnosis Dysphagia, oropharyngeal phase (R13.12) Attention and concentration deficit following -- Frontal lobe and executive function deficit following -- Impact on safety and function Mild aspiration risk;Moderate aspiration risk   CHL IP TREATMENT RECOMMENDATION 07/02/2020 Treatment Recommendations Therapy as outlined in treatment plan below   Prognosis 07/02/2020 Prognosis for Safe Diet Advancement Fair Barriers to Reach Goals Cognitive deficits Barriers/Prognosis Comment -- CHL IP DIET RECOMMENDATION 07/02/2020 SLP Diet Recommendations Dysphagia 3 (Mech soft) solids;Thin liquid Liquid Administration via Cup;No straw Medication Administration Whole meds with puree Compensations Minimize environmental distractions;Slow  rate;Small sips/bites Postural Changes Seated upright at 90 degrees   CHL IP OTHER RECOMMENDATIONS 07/02/2020 Recommended Consults -- Oral Care Recommendations Oral care BID;Staff/trained caregiver to provide oral care Other Recommendations --   CHL IP FOLLOW UP RECOMMENDATIONS 07/02/2020 Follow up Recommendations Skilled Nursing facility   [facility] IP FREQUENCY AND DURATION 07/02/2020 Speech Therapy Frequency (ACUTE ONLY) min 1 x/week Treatment Duration 2 weeks      CHL IP ORAL PHASE 07/02/2020 Oral Phase Impaired Oral - Pudding Teaspoon -- Oral - Pudding Cup -- Oral - Honey Teaspoon -- Oral - Honey Cup -- Oral - Nectar Teaspoon -- Oral -  Nectar Cup -- Oral - Nectar Straw Premature spillage;Decreased bolus cohesion Oral - Thin Teaspoon -- Oral - Thin Cup Premature spillage;Decreased bolus cohesion Oral - Thin Straw Premature spillage;Decreased bolus cohesion Oral - Puree Decreased bolus cohesion;Premature spillage Oral - Mech Soft -- Oral - Regular WFL Oral - Multi-Consistency -- Oral - Pill Decreased bolus cohesion;Premature spillage Oral Phase - Comment --  CHL IP PHARYNGEAL PHASE 07/02/2020 Pharyngeal Phase Impaired Pharyngeal- Pudding Teaspoon -- Pharyngeal -- Pharyngeal- Pudding Cup -- Pharyngeal -- Pharyngeal- Honey Teaspoon -- Pharyngeal -- Pharyngeal- Honey Cup -- Pharyngeal -- Pharyngeal- Nectar Teaspoon -- Pharyngeal -- Pharyngeal- Nectar Cup Delayed swallow initiation-vallecula Pharyngeal -- Pharyngeal- Nectar Straw Delayed swallow initiation-vallecula;Delayed swallow initiation-pyriform sinuses;Penetration/Aspiration during swallow Pharyngeal Material enters airway, remains ABOVE vocal cords then ejected out Pharyngeal- Thin Teaspoon -- Pharyngeal -- Pharyngeal- Thin Cup Delayed swallow initiation-pyriform sinuses;Penetration/Aspiration during swallow;Penetration/Apiration after swallow Pharyngeal Material enters airway, CONTACTS cords and not ejected out;Material enters airway, passes BELOW cords and not ejected out  despite cough attempt by patient;Material enters airway, passes BELOW cords without attempt by patient to eject out (silent aspiration) Pharyngeal- Thin Straw Delayed swallow initiation-pyriform sinuses;Penetration/Aspiration during swallow;Penetration/Apiration after swallow Pharyngeal Material enters airway, CONTACTS cords and not ejected out;Material enters airway, passes BELOW cords without attempt by patient to eject out (silent aspiration);Material enters airway, passes BELOW cords and not ejected out despite cough attempt by patient Pharyngeal- Puree Delayed swallow initiation-vallecula Pharyngeal -- Pharyngeal- Mechanical Soft -- Pharyngeal -- Pharyngeal- Regular Delayed swallow initiation-vallecula Pharyngeal -- Pharyngeal- Multi-consistency -- Pharyngeal -- Pharyngeal- Pill Delayed swallow initiation-vallecula Pharyngeal -- Pharyngeal Comment --  CHL IP CERVICAL ESOPHAGEAL PHASE 07/02/2020 Cervical Esophageal Phase WFL Pudding Teaspoon -- Pudding Cup -- Honey Teaspoon -- Honey Cup -- Nectar Teaspoon -- Nectar Cup -- Nectar Straw -- Thin Teaspoon -- Thin Cup -- Thin Straw -- Puree -- Mechanical Soft -- Regular -- Multi-consistency -- Pill -- Cervical Esophageal Comment -- Shanika I. Hardin Negus, [place], [place] Office number [phone] Pager [phone] Horton Marshall 07/02/2020, 3:23 PM              EEG adult  Result Date: 06/29/2020 Lora Havens, MD     06/29/2020  1:23 PM Patient Name: Nymir Kruchten MRN: [id number] Epilepsy Attending: Lora Havens Referring Physician/Provider: Dr Amie [place] Date: 06/29/2020 Duration: 21.43 mins Patient history: 80 year old male with waxing and waning mental status.  EEG to evaluate for seizures. Level of alertness: Awake AEDs during EEG study: Depakote Technical aspects: This EEG study was done with scalp electrodes positioned according to the 10-20 International system of electrode placement. Electrical activity was acquired at a  sampling rate of 500Hz  and reviewed with a high frequency filter of 70Hz  and a low frequency filter of 1Hz . EEG data were recorded continuously and digitally stored. Description: The posterior dominant rhythm consists of 8-9 Hz activity of moderate voltage (25-35 uV) seen predominantly in posterior head regions, symmetric and reactive to eye opening and eye closing. EEG showed intermittent generalized 5 to 6 Hz theta slowing. Hyperventilation and photic stimulation were not performed.   ABNORMALITY - Intermittent slow, generalized IMPRESSION: This study is suggestive of mild diffuse encephalopathy, nonspecific etiology. No seizures or epileptiform discharges were seen throughout the recording. Priyanka Barbra Sarks   CT HEAD CODE STROKE WO CONTRAST`  Result Date: 06/28/2020 CLINICAL DATA:  Code stroke.  Confusion.  Altered mental status. EXAM: CT HEAD WITHOUT CONTRAST TECHNIQUE: Contiguous axial images were obtained from the base of the skull through the  vertex without intravenous contrast. COMPARISON:  MRI 06/10/2020 FINDINGS: Brain: Age related volume loss. No evidence of old or acute small or large vessel infarction. No mass lesion, hemorrhage, hydrocephalus or extra-axial collection. Dilated perivascular space at the base of the brain. Vascular: There is atherosclerotic calcification of the major vessels at the base of the brain. Skull: Negative Sinuses/Orbits: Clear/normal Other: None ASPECTS ([place] Stroke Program Early CT Score) - Ganglionic level infarction (caudate, lentiform nuclei, internal capsule, insula, M1-M3 cortex): 7 - Supraganglionic infarction (M4-M6 cortex): 3 Total score (0-10 with 10 being normal): 10 IMPRESSION: 1. Normal head CT for age. Atherosclerotic calcification of the major vessels at base of the brain. 2. ASPECTS is 10 3. These results were communicated to Dr. Rory Percy at 7:18 pmon 5/2/2022by text page via the [facility] messaging system. Electronically Signed   By: Nelson Chimes M.D.   On:  06/28/2020 19:19     TODAY-DAY OF DISCHARGE:  Subjective:   [place] today has no headache,no chest abdominal pain,no new weakness tingling or numbness, feels much better wants to go home today.   Objective:   Blood pressure 135/63, pulse 73, temperature 98.6 F (37 C), temperature source Oral, resp. rate 18, height 6' (1.829 m), weight 86.4 kg, SpO2 98 %.  Intake/Output Summary (Last 24 hours) at 07/14/2020 1143 Last data filed at 07/14/2020 0654 Gross per 24 hour  Intake 360 ml  Output 1100 ml  Net -740 ml   Filed Weights   06/10/20 2312 07/11/20 0604  Weight: 86.2 kg 86.4 kg    Exam: Awake Alert, Oriented *3, No new F.N deficits, Normal affect [place].AT,PERRAL Supple Neck,No JVD, No cervical lymphadenopathy appriciated.  Symmetrical Chest wall movement, Good air movement bilaterally, CTAB RRR,No Gallops,Rubs or new Murmurs, No Parasternal Heave +ve B.Sounds, Abd Soft, Non tender, No organomegaly appriciated, No rebound -guarding or rigidity. No Cyanosis, Clubbing or edema, No new Rash or bruise   PERTINENT RADIOLOGIC STUDIES: No results found.   PERTINENT LAB RESULTS: CBC: No results for input(s): WBC, HGB, HCT, PLT in the last 72 hours. CMET CMP     Component Value Date/Time   NA 138 07/11/2020 0804   K 4.0 07/11/2020 0804   CL 106 07/11/2020 0804   CO2 24 07/11/2020 0804   GLUCOSE 238 (H) 07/11/2020 0804   BUN 16 07/11/2020 0804   CREATININE 0.98 07/11/2020 0804   CALCIUM 9.5 07/11/2020 0804   PROT 6.1 (L) 07/11/2020 0804   ALBUMIN 3.5 07/11/2020 0804   AST 28 07/11/2020 0804   ALT 29 07/11/2020 0804   ALKPHOS 45 07/11/2020 0804   BILITOT 0.4 07/11/2020 0804   GFRNONAA >60 07/11/2020 0804   GFRAA >60 05/20/2018 1419    GFR Estimated Creatinine Clearance: 67.1 mL/min (by C-G formula based on SCr of 0.98 mg/dL). No results for input(s): LIPASE, AMYLASE in the last 72 hours. No results for input(s): CKTOTAL, CKMB, CKMBINDEX, TROPONINI in the last 72  hours. Invalid input(s): POCBNP No results for input(s): DDIMER in the last 72 hours. No results for input(s): HGBA1C in the last 72 hours. No results for input(s): CHOL, HDL, LDLCALC, TRIG, CHOLHDL, LDLDIRECT in the last 72 hours. No results for input(s): TSH, T4TOTAL, T3FREE, THYROIDAB in the last 72 hours.  Invalid input(s): FREET3 No results for input(s): VITAMINB12, FOLATE, FERRITIN, TIBC, IRON, RETICCTPCT in the last 72 hours. Coags: No results for input(s): INR in the last 72 hours.  Invalid input(s): PT Microbiology: No results found for this or any previous visit (  from the past 240 hour(s)).  FURTHER DISCHARGE INSTRUCTIONS:  Get Medicines reviewed and adjusted: Please take all your medications with you for your next visit with your Primary MD  Laboratory/radiological data: Please request your Primary MD to go over all hospital tests and procedure/radiological results at the follow up, please ask your Primary MD to get all Hospital records sent to his/her office.  In some cases, they will be blood work, cultures and biopsy results pending at the time of your discharge. Please request that your primary care M.D. goes through all the records of your hospital data and follows up on these results.  Also Note the following: If you experience worsening of your admission symptoms, develop shortness of breath, life threatening emergency, suicidal or homicidal thoughts you must seek medical attention immediately by calling 911 or calling your MD immediately  if symptoms less severe.  You must read complete instructions/literature along with all the possible adverse reactions/side effects for all the Medicines you take and that have been prescribed to you. Take any new Medicines after you have completely understood and accpet all the possible adverse reactions/side effects.   Do not drive when taking Pain medications or sleeping medications (Benzodaizepines)  Do not take more than  prescribed Pain, Sleep and Anxiety Medications. It is not advisable to combine anxiety,sleep and pain medications without talking with your primary care practitioner  Special Instructions: If you have smoked or chewed Tobacco  in the last 2 yrs please stop smoking, stop any regular Alcohol  and or any Recreational drug use.  Wear Seat belts while driving.  Please note: You were cared for by a hospitalist during your hospital stay. Once you are discharged, your primary care physician will handle any further medical issues. Please note that NO REFILLS for any discharge medications will be authorized once you are discharged, as it is imperative that you return to your primary care physician (or establish a relationship with a primary care physician if you do not have one) for your post hospital discharge needs so that they can reassess your need for medications and monitor your lab values.  Total Time spent coordinating discharge including counseling, education and face to face time equals 35 minutes.  SignedOren Binet 07/14/2020 11:43 AM

## 2020-07-14 NOTE — TOC Progression Note (Signed)
Transition of Care [facility]) - Progression Note    Patient Details  Name: Patrick Phillips MRN: [id number] Date of Birth: [birth year]  Transition of Care [facility]) CM/SW [place], LCSW Phone Number: 07/14/2020, 12:49 PM  Clinical Narrative:    CSW received confirmation from [place] at [facility] that she received updated paperwork and CSW is able to schedule transportation.  CSW updated patient's daughter, [name], that patient is able to discharge there today. Misty reported being very upset that she was not given more notice of the discharge since she needs to buy a comforter and things for the patient. CSW reminded her that in our conversation yesterday CSW told her the discharge was only pending the TB test which would likely be back on Wednesday. She stated she still needed more notice and that patient would be alone for the transition. CSW offered to schedule the transport later in the day so she could get off work but she stated to "go on and do it" and hung up on CSW.    Expected Discharge Plan: Memory Care Barriers to Discharge: Continued Medical Work up  Expected Discharge Plan and Services Expected Discharge Plan: Memory Care In-house Referral: Clinical Social Work Discharge Planning Services: CM Consult Post Acute Care Choice: Nursing Home Living arrangements for the past 2 months: Single Family Home Expected Discharge Date: 07/14/20                                     Social Determinants of Health (SDOH) Interventions    Readmission Risk Interventions No flowsheet data found.

## 2020-07-14 NOTE — Progress Notes (Signed)
Occupational Therapy Treatment/Discharge Patient Details Name: Patrick Phillips MRN: [id number] DOB: 14-May-1940 Today's Date: 07/14/2020    History of present illness 80 y.o. male with presented with fever and confusion. Pt with dementia however still highly functional, took dog for a walk and dog came back without him. Pt later found lying on the side of road with no memory of what had happened but able to follow commands, taken to [facility] where MRI was negative for stroke and x-ray negative for fx. D/c home with daughter but pt with increasing confusion pt then brought  to [facility] ED 06/10/20. Pt with increasing confusion and agitation. Pt found to be febrile and COVID+. Admitted for acute metabolic encephalopathy.    PMHx of vascular dementia, prior CVA, DM-2, HTN   OT comments  Pt has made significant progress with functional goals. Pt continues to be confused and without much improvement with cognition. Pt has been mobilizing with no AD with nursing staff and is able to dress, groom and toilet himself with min guard - Sup. Pt has likely reached maximum level of function and this time with OT services and would benefit  From a LTC/memory care 24/7. OT will sign off   Follow Up Recommendations  SNF;Supervision/Assistance - 24 hour;Other (comment) (memory care)    Equipment Recommendations  None recommended by OT    Recommendations for Other Services      Precautions / Restrictions Precautions Precautions: Fall Precaution Comments: found down along side of road; h/o combativeness with dementia Restrictions Weight Bearing Restrictions: No       Mobility Bed Mobility Overal bed mobility: Independent Bed Mobility: Supine to Sit;Sit to Supine     Supine to sit: Supervision Sit to supine: Supervision        Transfers Overall transfer level: Modified independent Equipment used: None Transfers: Sit to/from Stand Sit to Stand: Modified independent (Device/Increase time)          General transfer comment: will reach out for wall, counter tops    Balance Overall balance assessment: Needs assistance Sitting-balance support: No upper extremity supported;Feet supported Sitting balance-Leahy Scale: Good     Standing balance support: No upper extremity supported;During functional activity Standing balance-Leahy Scale: Fair                             ADL either performed or assessed with clinical judgement   ADL Overall ADL's : Needs assistance/impaired     Grooming: Wash/dry hands;Wash/dry face;Min guard;Standing           Upper Body Dressing : Supervision/safety;Set up;Sitting   Lower Body Dressing: Min guard;Sit to/from stand   Toilet Transfer: Supervision/safety;Ambulation;Regular Toilet;Grab bars;Cueing for safety   Toileting- Clothing Manipulation and Hygiene: Min guard;Sit to/from stand       Functional mobility during ADLs: Min guard;Cueing for sequencing;Cueing for safety General ADL Comments: pt fixated on getting his clothes, dry britches and dry socks (none of pt;s clothing wet at the time)     Vision Patient Visual Report: No change from baseline Vision Assessment?: No apparent visual deficits   Perception     Praxis      Cognition Arousal/Alertness: Awake/alert Behavior During Therapy: Flat affect Overall Cognitive Status: History of cognitive impairments - at baseline Area of Impairment: Attention;Memory;Following commands;Safety/judgement;Awareness;Problem solving;Orientation                 Orientation Level: Disoriented to;Time;Place;Situation   Memory: Decreased recall of precautions;Decreased short-term memory Following  Commands: Follows one step commands with increased time;Follows one step commands consistently Safety/Judgement: Decreased awareness of deficits;Decreased awareness of safety   Problem Solving: Requires verbal cues;Requires tactile cues;Difficulty sequencing;Slow processing General  Comments: pt fixated on getting his clothes and some "dry britches to put on" (pt's clothing not wet)        Exercises     Shoulder Instructions       General Comments      Pertinent Vitals/ Pain       Pain Assessment: No/denies pain Pain Score: 0-No pain Faces Pain Scale: No hurt Pain Intervention(s): Monitored during session  Home Living                                          Prior Functioning/Environment              Frequency           Progress Toward Goals  OT Goals(current goals can now be found in the care plan section)  Progress towards OT goals: Progressing toward goals  Acute Rehab OT Goals Patient Stated Goal: find my pants  Plan Discharge plan remains appropriate    Co-evaluation                 AM-PAC OT "6 Clicks" Daily Activity     Outcome Measure   Help from another person eating meals?: None Help from another person taking care of personal grooming?: A Little Help from another person toileting, which includes using toliet, bedpan, or urinal?: A Little Help from another person bathing (including washing, rinsing, drying)?: A Lot Help from another person to put on and taking off regular upper body clothing?: A Little Help from another person to put on and taking off regular lower body clothing?: A Little 6 Click Score: 18    End of Session Equipment Utilized During Treatment: Gait belt  OT Visit Diagnosis: Unsteadiness on feet (R26.81);Other abnormalities of gait and mobility (R26.89);History of falling (Z91.81);Muscle weakness (generalized) (M62.81);Other symptoms and signs involving cognitive function   Activity Tolerance Patient tolerated treatment well;Treatment limited secondary to agitation   Patient Left in bed;with bed alarm set;with call bell/phone within reach   Nurse Communication Mobility status        Time: 1696-7893 OT Time Calculation (min): 16 min  Charges: OT General Charges $OT  Visit: 1 Visit OT Treatments $Self Care/Home Management : 8-22 mins     Emmit Alexanders [facility] 07/14/2020, 11:59 AM

## 2020-07-14 NOTE — Progress Notes (Signed)
Nsg Discharge Note  Admit Date:  06/10/2020 Discharge date: 07/14/2020   Kaeo Jacome to be D/C'd to [facility] per MD order.  AVS completed.  Copy in chart. Patient/caregiver able to verbalize understanding.  Discharge Medication: Allergies as of 07/14/2020      Reactions   Aricept [donepezil]    Per daughter, increased mood swings and angry      Medication List    STOP taking these medications   DRY EYES OP   escitalopram 20 MG tablet Commonly known as: LEXAPRO   glipiZIDE 5 MG tablet Commonly known as: GLUCOTROL   meloxicam 15 MG tablet Commonly known as: MOBIC   memantine 10 MG tablet Commonly known as: NAMENDA     TAKE these medications   amLODipine 5 MG tablet Commonly known as: NORVASC Take 2 tablets (10 mg total) by mouth daily. What changed:   how much to take  when to take this   aspirin EC 81 MG tablet Take 1 tablet (81 mg total) by mouth daily.   atorvastatin 40 MG tablet Commonly known as: LIPITOR Take 1 tablet (40 mg total) by mouth daily. What changed: Another medication with the same name was removed. Continue taking this medication, and follow the directions you see here.   carvedilol 6.25 MG tablet Commonly known as: COREG Take 1 tablet (6.25 mg total) by mouth 2 (two) times daily with a meal.   cyanocobalamin 1000 MCG tablet Take 1 tablet (1,000 mcg total) by mouth daily.   divalproex 125 MG capsule Commonly known as: DEPAKOTE SPRINKLE Take 2 capsules (250 mg total) by mouth every 12 (twelve) hours.   esomeprazole 40 MG capsule Commonly known as: NEXIUM Take 1 capsule (40 mg total) by mouth daily.   feeding supplement Liqd Take 237 mLs by mouth 2 (two) times daily between meals.   fluticasone 50 MCG/ACT nasal spray Commonly known as: FLONASE Place 2 sprays into both nostrils daily.   hydrALAZINE 100 MG tablet Commonly known as: APRESOLINE Take 1 tablet (100 mg total) by mouth every 8 (eight) hours.   LORazepam  0.5 MG tablet Commonly known as: Ativan Take 1 tablet (0.5 mg total) by mouth every 8 (eight) hours as needed for anxiety or sedation.   metFORMIN 1000 MG tablet Commonly known as: GLUCOPHAGE Take 1 tablet (1,000 mg total) by mouth 2 (two) times daily.   multivitamin with minerals Tabs tablet Take 1 tablet by mouth daily.   polyethylene glycol 17 g packet Commonly known as: MIRALAX / GLYCOLAX Take 17 g by mouth daily as needed for mild constipation.   QUEtiapine 50 MG tablet Commonly known as: SEROQUEL Take 1 tablet (50 mg total) by mouth daily at 8 pm.   senna 8.6 MG Tabs tablet Commonly known as: SENOKOT Take 2 tablets (17.2 mg total) by mouth daily.   tamsulosin 0.4 MG Caps capsule Commonly known as: FLOMAX Take 1 capsule (0.4 mg total) by mouth daily.   thiamine 100 MG tablet Take 1 tablet (100 mg total) by mouth daily.            Discharge Care Instructions  (From admission, onward)         Start     Ordered   07/14/20 0000  No dressing needed        07/14/20 0941          Discharge Assessment: Vitals:   07/13/20 2005 07/14/20 0507  BP: (!) 166/67 135/63  Pulse: 80 73  Resp:  18 18  Temp: 98.1 F (36.7 C) 98.6 F (37 C)  SpO2: 93% 98%   Skin clean, dry and intact. IV catheter discontinued intact. Site without signs and symptoms of complications - no redness or edema noted at insertion site, patient denies c/o pain - only slight tenderness at site.  Dressing with slight pressure applied.  D/c Instructions-Education: Discharge instructions given to patient/family with verbalized understanding. D/c education completed with patient/family including follow up instructions, medication list, d/c activities limitations if indicated, with other d/c instructions as indicated by MD - patient able to verbalize understanding, all questions fully answered. Patient instructed to return to ED, call 911, or call MD for any changes in condition.  Patient escorted in  bed via PTAR.  Eliezer Champagne, RN 07/14/2020 8:52 PM

## 2020-07-21 ENCOUNTER — Inpatient Hospital Stay (HOSPITAL_COMMUNITY)
Admission: EM | Admit: 2020-07-21 | Discharge: 2020-07-30 | DRG: 057 | Disposition: A | Discharge status: Skilled Nursing Facility | Payer: Medicare Other | Source: Skilled Nursing Facility | Attending: Internal Medicine | Admitting: Internal Medicine

## 2020-07-21 ENCOUNTER — Encounter (HOSPITAL_COMMUNITY): Payer: Self-pay

## 2020-07-21 ENCOUNTER — Emergency Department (HOSPITAL_COMMUNITY): Payer: Medicare Other

## 2020-07-21 ENCOUNTER — Other Ambulatory Visit: Payer: Self-pay

## 2020-07-21 DIAGNOSIS — U071 COVID-19: Secondary | ICD-10-CM | POA: Diagnosis present

## 2020-07-21 DIAGNOSIS — D649 Anemia, unspecified: Secondary | ICD-10-CM | POA: Diagnosis present

## 2020-07-21 DIAGNOSIS — I69898 Other sequelae of other cerebrovascular disease: Principal | ICD-10-CM

## 2020-07-21 DIAGNOSIS — I10 Essential (primary) hypertension: Secondary | ICD-10-CM | POA: Diagnosis present

## 2020-07-21 DIAGNOSIS — K529 Noninfective gastroenteritis and colitis, unspecified: Secondary | ICD-10-CM

## 2020-07-21 DIAGNOSIS — F0151 Vascular dementia with behavioral disturbance: Secondary | ICD-10-CM | POA: Diagnosis present

## 2020-07-21 DIAGNOSIS — Z7982 Long term (current) use of aspirin: Secondary | ICD-10-CM

## 2020-07-21 DIAGNOSIS — F0391 Unspecified dementia with behavioral disturbance: Secondary | ICD-10-CM | POA: Diagnosis present

## 2020-07-21 DIAGNOSIS — E869 Volume depletion, unspecified: Secondary | ICD-10-CM | POA: Diagnosis present

## 2020-07-21 DIAGNOSIS — I480 Paroxysmal atrial fibrillation: Secondary | ICD-10-CM | POA: Diagnosis present

## 2020-07-21 DIAGNOSIS — R338 Other retention of urine: Secondary | ICD-10-CM | POA: Diagnosis present

## 2020-07-21 DIAGNOSIS — I69318 Other symptoms and signs involving cognitive functions following cerebral infarction: Secondary | ICD-10-CM

## 2020-07-21 DIAGNOSIS — Z888 Allergy status to other drugs, medicaments and biological substances status: Secondary | ICD-10-CM

## 2020-07-21 DIAGNOSIS — R0602 Shortness of breath: Secondary | ICD-10-CM

## 2020-07-21 DIAGNOSIS — Z781 Physical restraint status: Secondary | ICD-10-CM

## 2020-07-21 DIAGNOSIS — Z85828 Personal history of other malignant neoplasm of skin: Secondary | ICD-10-CM

## 2020-07-21 DIAGNOSIS — N1330 Unspecified hydronephrosis: Secondary | ICD-10-CM | POA: Diagnosis present

## 2020-07-21 DIAGNOSIS — E119 Type 2 diabetes mellitus without complications: Secondary | ICD-10-CM | POA: Diagnosis present

## 2020-07-21 DIAGNOSIS — R451 Restlessness and agitation: Secondary | ICD-10-CM | POA: Diagnosis not present

## 2020-07-21 DIAGNOSIS — Z7984 Long term (current) use of oral hypoglycemic drugs: Secondary | ICD-10-CM

## 2020-07-21 DIAGNOSIS — F03918 Unspecified dementia, unspecified severity, with other behavioral disturbance: Secondary | ICD-10-CM | POA: Diagnosis present

## 2020-07-21 DIAGNOSIS — Z79899 Other long term (current) drug therapy: Secondary | ICD-10-CM

## 2020-07-21 DIAGNOSIS — F05 Delirium due to known physiological condition: Secondary | ICD-10-CM | POA: Diagnosis present

## 2020-07-21 DIAGNOSIS — F0150 Vascular dementia without behavioral disturbance: Secondary | ICD-10-CM | POA: Diagnosis present

## 2020-07-21 DIAGNOSIS — Z8616 Personal history of COVID-19: Secondary | ICD-10-CM

## 2020-07-21 DIAGNOSIS — N401 Enlarged prostate with lower urinary tract symptoms: Secondary | ICD-10-CM | POA: Diagnosis present

## 2020-07-21 DIAGNOSIS — Z7901 Long term (current) use of anticoagulants: Secondary | ICD-10-CM

## 2020-07-21 LAB — COMPREHENSIVE METABOLIC PANEL
ALT: 52 U/L — ABNORMAL HIGH (ref 0–44)
AST: 62 U/L — ABNORMAL HIGH (ref 15–41)
Albumin: 3.4 g/dL — ABNORMAL LOW (ref 3.5–5.0)
Alkaline Phosphatase: 48 U/L (ref 38–126)
Anion gap: 11 (ref 5–15)
BUN: 24 mg/dL — ABNORMAL HIGH (ref 8–23)
CO2: 23 mmol/L (ref 22–32)
Calcium: 9.2 mg/dL (ref 8.9–10.3)
Chloride: 105 mmol/L (ref 98–111)
Creatinine, Ser: 1.04 mg/dL (ref 0.61–1.24)
GFR, Estimated: >60 mL/min (ref >60)
Glucose, Bld: 121 mg/dL — ABNORMAL HIGH (ref 70–99)
Potassium: 4 mmol/L (ref 3.5–5.1)
Sodium: 139 mmol/L (ref 135–145)
Total Bilirubin: 0.8 mg/dL (ref 0.3–1.2)
Total Protein: 6 g/dL — ABNORMAL LOW (ref 6.5–8.1)

## 2020-07-21 LAB — CBC WITH DIFFERENTIAL/PLATELET
Abs Immature Granulocytes: 0.02 10*3/uL (ref 0.00–0.07)
Basophils Absolute: 0 10*3/uL (ref 0.0–0.1)
Basophils Relative: 0 %
Eosinophils Absolute: 0.1 10*3/uL (ref 0.0–0.5)
Eosinophils Relative: 1 %
HCT: 35.4 % — ABNORMAL LOW (ref 39.0–52.0)
Hemoglobin: 11.5 g/dL — ABNORMAL LOW (ref 13.0–17.0)
Immature Granulocytes: 0 %
Lymphocytes Relative: 26 %
Lymphs Abs: 2.6 10*3/uL (ref 0.7–4.0)
MCH: 28.8 pg (ref 26.0–34.0)
MCHC: 32.5 g/dL (ref 30.0–36.0)
MCV: 88.7 fL (ref 80.0–100.0)
Monocytes Absolute: 0.6 10*3/uL (ref 0.1–1.0)
Monocytes Relative: 6 %
Neutro Abs: 6.7 10*3/uL (ref 1.7–7.7)
Neutrophils Relative %: 67 %
Platelets: 293 10*3/uL (ref 150–400)
RBC: 3.99 MIL/uL — ABNORMAL LOW (ref 4.22–5.81)
RDW: 14.2 % (ref 11.5–15.5)
WBC: 10.1 10*3/uL (ref 4.0–10.5)
nRBC: 0 % (ref 0.0–0.2)

## 2020-07-21 LAB — TYPE AND SCREEN
ABO/RH(D): A NEG
Antibody Screen: NEGATIVE

## 2020-07-21 LAB — APTT: aPTT: 26 seconds (ref 24–36)

## 2020-07-21 LAB — PROTIME-INR
INR: 1 (ref 0.8–1.2)
Prothrombin Time: 13 seconds (ref 11.4–15.2)

## 2020-07-21 MED ORDER — LORAZEPAM 2 MG/ML IJ SOLN
1.0000 mg | Freq: Once | INTRAMUSCULAR | Status: AC
  Administered 2020-07-21: 1 mg via INTRAMUSCULAR
  Filled 2020-07-21: qty 1

## 2020-07-21 MED ORDER — DROPERIDOL 2.5 MG/ML IJ SOLN
5.0000 mg | Freq: Once | INTRAMUSCULAR | Status: AC
  Administered 2020-07-21: 5 mg via INTRAMUSCULAR
  Filled 2020-07-21: qty 2

## 2020-07-21 MED ORDER — SODIUM CHLORIDE 0.9 % IV BOLUS
500.0000 mL | Freq: Once | INTRAVENOUS | Status: AC
  Administered 2020-07-21: 500 mL via INTRAVENOUS

## 2020-07-21 MED ORDER — ZIPRASIDONE MESYLATE 20 MG IM SOLR
10.0000 mg | Freq: Once | INTRAMUSCULAR | Status: AC
  Administered 2020-07-21: 10 mg via INTRAMUSCULAR

## 2020-07-21 MED ORDER — CIPROFLOXACIN IN D5W 400 MG/200ML IV SOLN
400.0000 mg | Freq: Once | INTRAVENOUS | Status: AC
  Administered 2020-07-21: 400 mg via INTRAVENOUS
  Filled 2020-07-21: qty 200

## 2020-07-21 MED ORDER — STERILE WATER FOR INJECTION IJ SOLN
INTRAMUSCULAR | Status: AC
  Filled 2020-07-21: qty 10

## 2020-07-21 MED ORDER — METRONIDAZOLE 500 MG/100ML IV SOLN
500.0000 mg | Freq: Once | INTRAVENOUS | Status: AC
  Administered 2020-07-21: 500 mg via INTRAVENOUS
  Filled 2020-07-21: qty 100

## 2020-07-21 MED ORDER — ZIPRASIDONE MESYLATE 20 MG IM SOLR
10.0000 mg | Freq: Once | INTRAMUSCULAR | Status: AC
  Administered 2020-07-21: 10 mg via INTRAMUSCULAR
  Filled 2020-07-21: qty 20

## 2020-07-21 NOTE — ED Triage Notes (Signed)
Patient from [facility] with daughter. Patient reportedly had blood in stool and they have sent him here for evaluation of C. Diff. Unsure if patient having loose stools. Patient now in memory care unit and alert to baseline

## 2020-07-21 NOTE — ED Provider Notes (Signed)
[facility] EMERGENCY DEPARTMENT Provider Note   CSN: [id number] Arrival date & time: 07/21/20  1611  LEVEL 5 CAVEAT - DEMENTIA   History No chief complaint on file.   Patrick Phillips is a 80 y.o. male.  HPI 80 year old male presents from [facility] via his daughter for testing for C. difficile.  The history is unable to be obtained from the patient as he is demented and currently agitated.  He has been having significantly change mental status over the past month since he got admitted.  Now lives in the memory care unit.  According to the staff member I talked to at [facility] he has had 3-4 loose stools/diarrhea for the last 2 days.  The staff I was talking to was not sure if there was blood.  They want him tested for C. difficile prior to him coming back.  The daughter is concerned about his abdomen.  She states that over the last month or so his abdomen has been swollen but no one ever imaged it despite him going to the ER and then being admitted.  He has had some feet and hand swelling recently as well.  Past Medical History:  Diagnosis Date  . Cancer ([place])    skin cancer  . Dementia ([place])   . Diabetes mellitus without complication ([place])   . Hypertension   . Memory deficit     Patient Active Problem List   Diagnosis Date Noted  . Malnutrition of moderate degree 06/30/2020  . Acute metabolic encephalopathy 97/35/3299  . COVID-19 virus infection 06/11/2020  . Vascular dementia ([place]) 06/11/2020  . Acute ischemic stroke ([place]) 07/24/2017  . Chest pain 12/22/2015  . Chronic back pain 12/22/2015  . Syncope 10/17/2013  . Sinus bradycardia 10/17/2013  . SOB (shortness of breath) 10/17/2013  . Hypertension   . Diabetes mellitus without complication [facility])     Past Surgical History:  Procedure Laterality Date  . BACK SURGERY    . HERNIA REPAIR    . LOOP RECORDER INSERTION N/A 07/26/2017   Procedure: LOOP RECORDER INSERTION;  Surgeon: Evans Lance,  MD;  Location: [facility] CV LAB;  Service: Cardiovascular;  Laterality: N/A;  . TEE WITHOUT CARDIOVERSION N/A 07/26/2017   Procedure: TRANSESOPHAGEAL ECHOCARDIOGRAM (TEE);  Surgeon: Fay Records, MD;  Location: [facility] ENDOSCOPY;  Service: Cardiovascular;  Laterality: N/A;  . TRIGGER FINGER RELEASE Right 12/02/2015   Procedure: RIGHT THUMB RELEASE TRIGGER FINGER/A-1 PULLEY;  Surgeon: Leanora Cover, MD;  Location: [facility];  Service: Orthopedics;  Laterality: Right;       Family History  Problem Relation Age of Onset  . Cancer Mother   . Cancer Father   . Cancer Brother   . Cancer Other     Social History   Tobacco Use  . Smoking status: Never Smoker  . Smokeless tobacco: Never Used  Vaping Use  . Vaping Use: Never used  Substance Use Topics  . Alcohol use: Yes    Comment: occ  . Drug use: No    Home Medications Prior to Admission medications   Medication Sig Start Date End Date Taking? Authorizing Provider  amLODipine (NORVASC) 5 MG tablet Take 2 tablets (10 mg total) by mouth daily. 07/14/20   Ghimire, Henreitta Leber, MD  aspirin EC 81 MG tablet Take 1 tablet (81 mg total) by mouth daily. 07/14/20   Ghimire, Henreitta Leber, MD  atorvastatin (LIPITOR) 40 MG tablet Take 1 tablet (40 mg total) by mouth  daily. 07/14/20   Ghimire, Henreitta Leber, MD  carvedilol (COREG) 6.25 MG tablet Take 1 tablet (6.25 mg total) by mouth 2 (two) times daily with a meal. 07/14/20   Ghimire, Henreitta Leber, MD  divalproex (DEPAKOTE SPRINKLE) 125 MG capsule Take 2 capsules (250 mg total) by mouth every 12 (twelve) hours. 07/14/20   Ghimire, Henreitta Leber, MD  esomeprazole (NEXIUM) 40 MG capsule Take 1 capsule (40 mg total) by mouth daily. 07/14/20   Ghimire, Henreitta Leber, MD  feeding supplement (ENSURE ENLIVE / ENSURE PLUS) LIQD Take 237 mLs by mouth 2 (two) times daily between meals. 07/14/20 08/13/20  Ghimire, Henreitta Leber, MD  fluticasone (FLONASE) 50 MCG/ACT nasal spray Place 2 sprays into both nostrils daily. 07/14/20    Ghimire, Henreitta Leber, MD  hydrALAZINE (APRESOLINE) 100 MG tablet Take 1 tablet (100 mg total) by mouth every 8 (eight) hours. 07/14/20   Ghimire, Henreitta Leber, MD  LORazepam (ATIVAN) 0.5 MG tablet Take 1 tablet (0.5 mg total) by mouth every 8 (eight) hours as needed for anxiety or sedation. 07/14/20 07/14/21  Ghimire, Henreitta Leber, MD  metFORMIN (GLUCOPHAGE) 1000 MG tablet Take 1 tablet (1,000 mg total) by mouth 2 (two) times daily. 07/14/20   Ghimire, Henreitta Leber, MD  Multiple Vitamin (MULTIVITAMIN WITH MINERALS) TABS tablet Take 1 tablet by mouth daily. 07/14/20   Ghimire, Henreitta Leber, MD  polyethylene glycol (MIRALAX / GLYCOLAX) 17 g packet Take 17 g by mouth daily as needed for mild constipation. 07/14/20   Ghimire, Henreitta Leber, MD  QUEtiapine (SEROQUEL) 50 MG tablet Take 1 tablet (50 mg total) by mouth daily at 8 pm. 07/14/20   Ghimire, Henreitta Leber, MD  senna (SENOKOT) 8.6 MG TABS tablet Take 2 tablets (17.2 mg total) by mouth daily. 07/14/20   Ghimire, Henreitta Leber, MD  tamsulosin (FLOMAX) 0.4 MG CAPS capsule Take 1 capsule (0.4 mg total) by mouth daily. 07/14/20   Ghimire, Henreitta Leber, MD  thiamine 100 MG tablet Take 1 tablet (100 mg total) by mouth daily. 07/14/20   Ghimire, Henreitta Leber, MD  vitamin B-12 1000 MCG tablet Take 1 tablet (1,000 mcg total) by mouth daily. 07/14/20   Ghimire, Henreitta Leber, MD    Allergies    Aricept [donepezil]  Review of Systems   Review of Systems  Unable to perform ROS: Dementia    Physical Exam Updated Vital Signs BP (!) 161/82 (BP Location: Right Arm)   Pulse 86   Temp 99.4 F (37.4 C)   Resp 18   SpO2 99%   Physical Exam Vitals and nursing note reviewed.  Constitutional:      Appearance: He is well-developed.  HENT:     Head: Normocephalic and atraumatic.     Right Ear: External ear normal.     Left Ear: External ear normal.     Nose: Nose normal.  Eyes:     General:        Right eye: No discharge.        Left eye: No discharge.  Cardiovascular:     Rate and Rhythm:  Normal rate and regular rhythm.     Heart sounds: Normal heart sounds.  Pulmonary:     Effort: Pulmonary effort is normal.     Breath sounds: Normal breath sounds.  Abdominal:     Palpations: Abdomen is soft.     Tenderness: There is no abdominal tenderness.  Musculoskeletal:     Cervical back: Neck supple.  Skin:    General: Skin is  warm and dry.  Neurological:     Mental Status: He is alert.  Psychiatric:        Behavior: Behavior is agitated.     ED Results / Procedures / Treatments   Labs (all labs ordered are listed, but only abnormal results are displayed) Labs Reviewed  CBC WITH DIFFERENTIAL/PLATELET - Abnormal; Notable for the following components:      Result Value   RBC 3.99 (*)    Hemoglobin 11.5 (*)    HCT 35.4 (*)    All other components within normal limits  COMPREHENSIVE METABOLIC PANEL - Abnormal; Notable for the following components:   Glucose, Bld 121 (*)    BUN 24 (*)    Total Protein 6.0 (*)    Albumin 3.4 (*)    AST 62 (*)    ALT 52 (*)    All other components within normal limits  C DIFFICILE QUICK SCREEN W PCR REFLEX  PROTIME-INR  APTT  URINALYSIS, ROUTINE W REFLEX MICROSCOPIC  TYPE AND SCREEN  ABO/RH    EKG EKG Interpretation  Date/Time:  Wednesday Jul 21 2020 16:21:52 EDT Ventricular Rate:  88 PR Interval:  166 QRS Duration: 86 QT Interval:  370 QTC Calculation: 447 R Axis:   12 Text Interpretation: Sinus rhythm with occasional Premature ventricular complexes no acute ST/T changes no significant change since Jul 09 2020 Confirmed by Sherwood Gambler [phone]) on 07/21/2020 6:58:00 PM   Radiology CT ABDOMEN PELVIS WO CONTRAST  Result Date: 07/21/2020 CLINICAL DATA:  Abdominal distension.  Blood in stool. EXAM: CT ABDOMEN AND PELVIS WITHOUT CONTRAST TECHNIQUE: Multidetector CT imaging of the abdomen and pelvis was performed following the standard protocol without IV contrast. COMPARISON:  CT 10/30/2017 FINDINGS: Lower chest: Small  right lower lobe pulmonary nodules are unchanged from prior exam, benign. No acute airspace disease or pleural effusion. Stable heart size with coronary artery calcifications. Hepatobiliary: No focal liver abnormality is seen. No gallstones, gallbladder wall thickening, or biliary dilatation. Pancreas: No ductal dilatation or inflammation. Spleen: Normal in size without focal abnormality. Adrenals/Urinary Tract: Normal adrenal glands. Prominence of both renal collecting systems is felt to be due to degree of bladder distension. No evidence of renal or ureteral calculi. Bladder is distended with volume of 13.2 x 9.5 x 16 cm (volume = 1100 cm^3). Minimal wall thickening about the bladder dome. Possible bladder dome diverticulum. Cysts arising from the mid lower right kidney. Small hyperdense lesion in the upper left kidney is incompletely characterized. Stomach/Bowel: Small hiatal hernia. No small bowel obstruction or inflammation. Small volume of formed stool in the ascending and proximal transverse colon. More distal colon is decompressed which limits assessment for wall thickening. There may be mild sigmoid wall thickening and edema. Appendix not well visualized on the current exam, no appendicitis. Vascular/Lymphatic: Aortic atherosclerosis without aneurysm. Prominent retroperitoneal lymph nodes are similar to prior exam. Prominent central mesenteric nodes measuring up to 15 mm, series 3, image 36, also similar to prior exam. Mild chronic misty mesentery in the left upper quadrant. Reproductive: Enlarged prostate gland causing mass effect on the bladder base. Prostate spans 5.3 cm transverse. Other: No free air or ascites. Previous left inguinal hernia is no longer seen. Musculoskeletal: Degenerative change throughout the lumbar spine. There are no acute or suspicious osseous abnormalities. IMPRESSION: 1. Possible sigmoid colitis with mild wall thickening and edema. 2. Enlarged prostate gland causing mass effect  on the bladder base. 3. Distended urinary bladder with mild wall thickening. Prominence of both renal  collecting systems is felt to be due to degree of bladder distension. 4. 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 Aortic Atherosclerosis (ICD10-I70.0). Electronically Signed   By: Keith Rake M.D.   On: 07/21/2020 22:47    Procedures .Critical Care Performed by: Sherwood Gambler, MD Authorized by: Sherwood Gambler, MD   Critical care provider statement:    Critical care time (minutes):  40   Critical care time was exclusive of:  Separately billable procedures and treating other patients   Critical care was necessary to treat or prevent imminent or life-threatening deterioration of the following conditions: deliriuum.   Critical care was time spent personally by me on the following activities:  Discussions with consultants, evaluation of patient's response to treatment, examination of patient, ordering and performing treatments and interventions, ordering and review of laboratory studies, ordering and review of radiographic studies, pulse oximetry, re-evaluation of patient's condition, obtaining history from patient or surrogate and review of old charts     Medications Ordered in ED Medications  metroNIDAZOLE (FLAGYL) IVPB 500 mg (has no administration in time range)  ciprofloxacin (CIPRO) IVPB 400 mg (has no administration in time range)  sodium chloride 0.9 % bolus 500 mL (has no administration in time range)  ziprasidone (GEODON) injection 10 mg (10 mg Intramuscular Given 07/21/20 1948)  sterile water (preservative free) injection (  Given 07/21/20 1948)  ziprasidone (GEODON) injection 10 mg (10 mg Intramuscular Given 07/21/20 2021)  LORazepam (ATIVAN) injection 1 mg (1 mg Intramuscular Given 07/21/20 2103)  droperidol (INAPSINE) 2.5 MG/ML injection 5 mg (5 mg Intramuscular Given 07/21/20 2219)    ED Course  I have reviewed the triage vital signs and the  nursing notes.  Pertinent labs & imaging results that were available during my care of the patient were reviewed by me and considered in my medical decision making (see chart for details).  Clinical Course as of 07/21/20 2327  Wed Jul 21, 2020  2059 We have tried to give patient IM sedation to help get CT scan.  Prior to this I discussed with daughter and discussed the potential risks of doing this.  Despite 2 doses of 10 mg IM Geodon he is still awake though he does appear a little sleepier.  I discussed with pharmacy, we will also try a small doses of IM Ativan and will try and get an IV.  Given his age and vascular dementia do not want to go overboard. [SG]    Clinical Course User Index [SG] Sherwood Gambler, MD   MDM Rules/Calculators/A&P                          Patient had receive significant amount of antipsychotics to help get him sedated enough for CT of his abdomen and pelvis.  This does show some colitis in addition to some hydronephrosis and probable bladder distention.  He had urinated just prior to CT and then urinated again in the bed in the emergency department.  Bedside bladder scan is having a hard time picking up a true amount though it does not look to be overtly large now.  At this point, he is difficult to care for and recently has been throwing feces and urine at the facility.  I am not sure how well he will be able to take antibiotics and I think he probably needs to come back into the hospital for admission. Discussed with Dr. Olevia Bowens. Final Clinical Impression(s) / ED Diagnoses  Final diagnoses:  Colitis, acute    Rx / DC Orders ED Discharge Orders    None       Sherwood Gambler, MD 07/21/20 2337

## 2020-07-21 NOTE — ED Notes (Signed)
Patient agitated , combative with staff , verbally abusive , refused to stay on bed , attempted multiple times to redirect patient to room but failed , EDP notified .

## 2020-07-21 NOTE — ED Provider Notes (Signed)
Emergency Medicine Provider Triage Evaluation Note  Orvill Coulthard , a 80 y.o. male  was evaluated in triage.  Pt complains of bloody stool that was noted at his facility at [place]. Daughter is at bedside and states pt has also had ams since he had covid a month ago.   Review of Systems  Positive: Blood in stool, ams Negative: fever  Physical Exam  There were no vitals taken for this visit. Gen:   Awake, no distress   Resp:  Normal effort  MSK:   Moves extremities without difficulty  Other:  Pleasantly demented  Medical Decision Making  Medically screening exam initiated at 4:19 PM.  Appropriate orders placed.  Deondre Marinaro was informed that the remainder of the evaluation will be completed by another provider, this initial triage assessment does not replace that evaluation, and the importance of remaining in the ED until their evaluation is complete.     Bishop Dublin 07/21/20 1629    Lacretia Leigh, MD 07/23/20 [phone]

## 2020-07-21 NOTE — ED Notes (Signed)
Patient wet the bed and new chucks+briefs were placed on the patient

## 2020-07-21 NOTE — ED Notes (Signed)
Attempted to change pt into gown, pt was aggressive with staff and family member present. Patient refuses to sit in bed or be put on cardiac monitoring. RN aware.

## 2020-07-21 NOTE — H&P (Signed)
History and Physical    [name] UXN:[id number] DOB: Apr 17, 1940 DOA: 07/21/2020  PCP: [name], MD  Patient coming from: [facility].  I have personally briefly reviewed patient's old medical records in [place]  Chief Complaint: "Blood in stools".  HPI: Patrick Phillips is a 80 y.o. male with medical history significant of unspecified skin cancer, vascular dementia with behavioral disturbance, type II DM, hypertension who is brought to the emergency department due to having blood in stools.  The patient is currently sedated and unable to provide further information.  According to the [facility] staff he was having diarrhea episodes for the last 2 days so they sent him to the ED for further evaluation and would like him to be tested for C. difficile before he returns to the facility.  The patient's daughter has stated that the patient has had a distended abdomen for the past month or so.  ED Course: Initial vital signs were temperature 99.4 F, pulse 84, respiration 15, BP 170/71 mmHg O2 sat 98% on room air.  Lab work: CBC showed a white count of 10.1, hemoglobin 11.5 g/dL platelets 293.  PT was 13.0, INR 1.0 and PTT 26.  CMP showed normal electrolytes, creatinine and bilirubin.  Glucose 120 and BUN 24 mg/dL.  Total protein 6.0 and albumin 3.4 g/dL.  AST was 62 and ALT was 52 units/L.  COVID-19 PCR was incidentally positive.  Imaging: Showing signs of sigmoid diverticulitis and enlarged prostate causing mass-effect on the bladder base.  There is a distended urinary bladder with mild wall thickening.  Prominence of both renal collecting system which is likely due to bladder distention.  There is chronic misty mesentery with stable left mesenteric adenopathy.  There was aortic atherosclerosis.  Review of Systems: As per HPI otherwise all other systems reviewed and are negative.  Past Medical History:  Diagnosis Date  . Cancer ([place])    skin cancer  . Dementia ([place])   .  Diabetes mellitus without complication ([place])   . Hypertension   . Memory deficit    Past Surgical History:  Procedure Laterality Date  . BACK SURGERY    . HERNIA REPAIR    . LOOP RECORDER INSERTION N/A 07/26/2017   Procedure: LOOP RECORDER INSERTION;  Surgeon: Evans Lance, MD;  Location: [facility];  Service: Cardiovascular;  Laterality: N/A;  . TEE WITHOUT CARDIOVERSION N/A 07/26/2017   Procedure: TRANSESOPHAGEAL ECHOCARDIOGRAM (TEE);  Surgeon: Fay Records, MD;  Location: [facility] ENDOSCOPY;  Service: Cardiovascular;  Laterality: N/A;  . TRIGGER FINGER RELEASE Right 12/02/2015   Procedure: RIGHT THUMB RELEASE TRIGGER FINGER/A-1 PULLEY;  Surgeon: Leanora Cover, MD;  Location: [facility];  Service: Orthopedics;  Laterality: Right;   Social History  reports that he has never smoked. He has never used smokeless tobacco. He reports current alcohol use. He reports that he does not use drugs.  Allergies  Allergen Reactions  . Aricept [Donepezil]     Per daughter, increased mood swings and angry   Family History  Problem Relation Age of Onset  . Cancer Mother   . Cancer Father   . Cancer Brother   . Cancer Other    Prior to Admission medications   Medication Sig Start Date End Date Taking? Authorizing Provider  amLODipine (NORVASC) 5 MG tablet Take 2 tablets (10 mg total) by mouth daily. 07/14/20   Ghimire, Henreitta Leber, MD  aspirin EC 81 MG tablet Take 1 tablet (81 mg total)  by mouth daily. 07/14/20   Ghimire, Henreitta Leber, MD  atorvastatin (LIPITOR) 40 MG tablet Take 1 tablet (40 mg total) by mouth daily. 07/14/20   Ghimire, Henreitta Leber, MD  carvedilol (COREG) 6.25 MG tablet Take 1 tablet (6.25 mg total) by mouth 2 (two) times daily with a meal. 07/14/20   Ghimire, Henreitta Leber, MD  divalproex (DEPAKOTE SPRINKLE) 125 MG capsule Take 2 capsules (250 mg total) by mouth every 12 (twelve) hours. 07/14/20   Ghimire, Henreitta Leber, MD  esomeprazole (NEXIUM) 40 MG capsule Take 1 capsule (40 mg  total) by mouth daily. 07/14/20   Ghimire, Henreitta Leber, MD  feeding supplement (ENSURE ENLIVE / ENSURE PLUS) LIQD Take 237 mLs by mouth 2 (two) times daily between meals. 07/14/20 08/13/20  Ghimire, Henreitta Leber, MD  fluticasone (FLONASE) 50 MCG/ACT nasal spray Place 2 sprays into both nostrils daily. 07/14/20   Ghimire, Henreitta Leber, MD  hydrALAZINE (APRESOLINE) 100 MG tablet Take 1 tablet (100 mg total) by mouth every 8 (eight) hours. 07/14/20   Ghimire, Henreitta Leber, MD  LORazepam (ATIVAN) 0.5 MG tablet Take 1 tablet (0.5 mg total) by mouth every 8 (eight) hours as needed for anxiety or sedation. 07/14/20 07/14/21  Ghimire, Henreitta Leber, MD  metFORMIN (GLUCOPHAGE) 1000 MG tablet Take 1 tablet (1,000 mg total) by mouth 2 (two) times daily. 07/14/20   Ghimire, Henreitta Leber, MD  Multiple Vitamin (MULTIVITAMIN WITH MINERALS) TABS tablet Take 1 tablet by mouth daily. 07/14/20   Ghimire, Henreitta Leber, MD  polyethylene glycol (MIRALAX / GLYCOLAX) 17 g packet Take 17 g by mouth daily as needed for mild constipation. 07/14/20   Ghimire, Henreitta Leber, MD  QUEtiapine (SEROQUEL) 50 MG tablet Take 1 tablet (50 mg total) by mouth daily at 8 pm. 07/14/20   Ghimire, Henreitta Leber, MD  senna (SENOKOT) 8.6 MG TABS tablet Take 2 tablets (17.2 mg total) by mouth daily. 07/14/20   Ghimire, Henreitta Leber, MD  tamsulosin (FLOMAX) 0.4 MG CAPS capsule Take 1 capsule (0.4 mg total) by mouth daily. 07/14/20   Ghimire, Henreitta Leber, MD  thiamine 100 MG tablet Take 1 tablet (100 mg total) by mouth daily. 07/14/20   Ghimire, Henreitta Leber, MD  vitamin B-12 1000 MCG tablet Take 1 tablet (1,000 mcg total) by mouth daily. 07/14/20   Jonetta Osgood, MD    Physical Exam: Vitals:   07/21/20 1621 07/21/20 1757 07/21/20 2110 07/21/20 2341  BP: (!) 170/71 (!) 163/60 (!) 161/82 (!) 141/60  Pulse: 84 78 86 78  Resp: 15 18 18 18   Temp: 99.4 F (37.4 C)     SpO2: 98% 100% 99% 99%   Constitutional: NAD, calm, comfortable Eyes: PERRL, lids and conjunctivae normal ENMT: Mucous  membranes are dry.  Posterior pharynx clear of any exudate or lesions. Neck: normal, supple, no masses, no thyromegaly Respiratory: clear to auscultation bilaterally, no wheezing, no crackles. Normal respiratory effort. No accessory muscle use.  Cardiovascular: Regular rate and rhythm, no murmurs / rubs / gallops. No extremity edema. 2+ pedal pulses. No carotid bruits.  Abdomen: Mild distention.  Bowel sounds positive.  Soft, mild LLQ tenderness, no guarding or rebound, no masses palpated. No hepatosplenomegaly. Musculoskeletal: no clubbing / cyanosis.  Good ROM, no contractures. Normal muscle tone.  Skin: Few small excoriations on extremities. Neurologic: Sedated.  Unable to fully evaluate. Psychiatric: Sedated.  Unable to fully evaluate.  Labs on Admission: I have personally reviewed following labs and imaging studies  CBC: Recent Labs  Lab 07/21/20  1626  WBC 10.1  NEUTROABS 6.7  HGB 11.5*  HCT 35.4*  MCV 88.7  PLT 993    Basic Metabolic Panel: Recent Labs  Lab 07/21/20 1626  NA 139  K 4.0  CL 105  CO2 23  GLUCOSE 121*  BUN 24*  CREATININE 1.04  CALCIUM 9.2    GFR: Estimated Creatinine Clearance: 63.2 mL/min (by C-G formula based on SCr of 1.04 mg/dL).  Liver Function Tests: Recent Labs  Lab 07/21/20 1626  AST 62*  ALT 52*  ALKPHOS 48  BILITOT 0.8  PROT 6.0*  ALBUMIN 3.4*   Radiological Exams on Admission: CT ABDOMEN PELVIS WO CONTRAST  Result Date: 07/21/2020 CLINICAL DATA:  Abdominal distension.  Blood in stool. EXAM: CT ABDOMEN AND PELVIS WITHOUT CONTRAST TECHNIQUE: Multidetector CT imaging of the abdomen and pelvis was performed following the standard protocol without IV contrast. COMPARISON:  CT 10/30/2017 FINDINGS: Lower chest: Small right lower lobe pulmonary nodules are unchanged from prior exam, benign. No acute airspace disease or pleural effusion. Stable heart size with coronary artery calcifications. Hepatobiliary: No focal liver abnormality is  seen. No gallstones, gallbladder wall thickening, or biliary dilatation. Pancreas: No ductal dilatation or inflammation. Spleen: Normal in size without focal abnormality. Adrenals/Urinary Tract: Normal adrenal glands. Prominence of both renal collecting systems is felt to be due to degree of bladder distension. No evidence of renal or ureteral calculi. Bladder is distended with volume of 13.2 x 9.5 x 16 cm (volume = 1100 cm^3). Minimal wall thickening about the bladder dome. Possible bladder dome diverticulum. Cysts arising from the mid lower right kidney. Small hyperdense lesion in the upper left kidney is incompletely characterized. Stomach/Bowel: Small hiatal hernia. No small bowel obstruction or inflammation. Small volume of formed stool in the ascending and proximal transverse colon. More distal colon is decompressed which limits assessment for wall thickening. There may be mild sigmoid wall thickening and edema. Appendix not well visualized on the current exam, no appendicitis. Vascular/Lymphatic: Aortic atherosclerosis without aneurysm. Prominent retroperitoneal lymph nodes are similar to prior exam. Prominent central mesenteric nodes measuring up to 15 mm, series 3, image 36, also similar to prior exam. Mild chronic misty mesentery in the left upper quadrant. Reproductive: Enlarged prostate gland causing mass effect on the bladder base. Prostate spans 5.3 cm transverse. Other: No free air or ascites. Previous left inguinal hernia is no longer seen. Musculoskeletal: Degenerative change throughout the lumbar spine. There are no acute or suspicious osseous abnormalities. IMPRESSION: 1. Possible sigmoid colitis with mild wall thickening and edema. 2. 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 3. Distended urinary bladder with mild wall thickening. Prominence of both renal collecting systems is felt to be due to degree of bladder distension. 4. Chronic misty mesentery with stable left  mesenteric adenopathy from 2019. Aortic Atherosclerosis (ICD10-I70.0). Electronically Signed   By: Keith Rake M.D.   On: 07/21/2020 22:47    EKG: Independently reviewed.  Vent. rate 88 BPM PR interval 166 ms QRS duration 86 ms QT/QTcB 370/447 ms P-R-T axes 90 12 63 Sinus rhythm with occasional Premature ventricular complexes  Assessment/Plan Principal Problem:   Acute colitis Observation/progressive unit. Clear liquids diet. Continue gentle IVF. Continue ciprofloxacin 400 mg IVPB q 12 hours. Continue metronidazole 500 mg IVPB q 8 hours.  Active Problems:   Volume depletion Likely the reason for mild transaminitis. Continue IV fluids. Follow-up hepatic function.    COVID-19 virus infection No respiratory distress with dyspnea. Received 1 dose of remdesivir.  However, had 3 doses on recent admission as well. Has received a recent dose of the Moderna vaccine. Continue airborne and droplet isolation.    Hypertension Continue amlodipine 10 mg p.o. daily. Continue carvedilol 6.25 mg p.o. twice daily. Continue hydralazine 100 mg p.o. 3 times daily. Monitor blood pressure and heart rate.    Type 2 diabetes mellitus (HCC) Carbohydrate modified diet. Continue metformin 1000 mg p.o. twice daily. CBG monitoring with RI SS.    Vascular dementia (HCC) Continue Seroquel 50 mg p.o. daily. Anxiolytics as needed.    Normocytic anemia Monitor hematocrit and hemoglobin.    DVT prophylaxis: SCDs. Code Status:   Full code. Family Communication:   Disposition Plan:   Patient is from:  Home.  Anticipated DC to:  Home.  Anticipated DC date:  07/23/2020 or 07/24/2020.  Anticipated DC barriers: Clinical status.  Consults called:   Admission status:  Observation/progressive unit.   Severity of Illness: High severity due to presenting with diarrhea in the setting of sigmoid colitis.  The patient will need to remain for IV antibiotic therapy for 2 to 3 days.  Reubin Milan MD [facility]  How to contact the [facility] Attending or Consulting provider [place] or covering provider during after hours [place], for this patient?   1. Check the care team in [facility] and look for a) attending/consulting TRH provider listed and b) the [facility] team listed 2. Log into [email or web address] and use [place]'s universal password to access. If you do not have the password, please contact the hospital operator. 3. Locate the [facility] provider you are looking for under [facility] and page to a number that you can be directly reached. 4. If you still have difficulty reaching the provider, please page the [facility] (Director on Call) for the Hospitalists listed on amion for assistance.  07/21/2020, 11:58 PM   This document was prepared using Dragon voice recognition software and may contain some unintended transcription errors.

## 2020-07-22 ENCOUNTER — Encounter (HOSPITAL_COMMUNITY): Payer: Self-pay | Admitting: Internal Medicine

## 2020-07-22 ENCOUNTER — Observation Stay (HOSPITAL_COMMUNITY): Payer: Medicare Other

## 2020-07-22 DIAGNOSIS — Z7984 Long term (current) use of oral hypoglycemic drugs: Secondary | ICD-10-CM | POA: Diagnosis not present

## 2020-07-22 DIAGNOSIS — Z888 Allergy status to other drugs, medicaments and biological substances status: Secondary | ICD-10-CM | POA: Diagnosis not present

## 2020-07-22 DIAGNOSIS — N401 Enlarged prostate with lower urinary tract symptoms: Secondary | ICD-10-CM | POA: Diagnosis present

## 2020-07-22 DIAGNOSIS — I480 Paroxysmal atrial fibrillation: Secondary | ICD-10-CM | POA: Diagnosis present

## 2020-07-22 DIAGNOSIS — F05 Delirium due to known physiological condition: Secondary | ICD-10-CM | POA: Diagnosis present

## 2020-07-22 DIAGNOSIS — N1330 Unspecified hydronephrosis: Secondary | ICD-10-CM | POA: Diagnosis present

## 2020-07-22 DIAGNOSIS — I69898 Other sequelae of other cerebrovascular disease: Secondary | ICD-10-CM | POA: Diagnosis not present

## 2020-07-22 DIAGNOSIS — D649 Anemia, unspecified: Secondary | ICD-10-CM | POA: Diagnosis present

## 2020-07-22 DIAGNOSIS — Z7901 Long term (current) use of anticoagulants: Secondary | ICD-10-CM | POA: Diagnosis not present

## 2020-07-22 DIAGNOSIS — Z79899 Other long term (current) drug therapy: Secondary | ICD-10-CM | POA: Diagnosis not present

## 2020-07-22 DIAGNOSIS — Z85828 Personal history of other malignant neoplasm of skin: Secondary | ICD-10-CM | POA: Diagnosis not present

## 2020-07-22 DIAGNOSIS — E869 Volume depletion, unspecified: Secondary | ICD-10-CM | POA: Diagnosis present

## 2020-07-22 DIAGNOSIS — R451 Restlessness and agitation: Secondary | ICD-10-CM | POA: Diagnosis present

## 2020-07-22 DIAGNOSIS — F0151 Vascular dementia with behavioral disturbance: Secondary | ICD-10-CM | POA: Diagnosis present

## 2020-07-22 DIAGNOSIS — I69318 Other symptoms and signs involving cognitive functions following cerebral infarction: Secondary | ICD-10-CM | POA: Diagnosis not present

## 2020-07-22 DIAGNOSIS — Z8616 Personal history of COVID-19: Secondary | ICD-10-CM | POA: Diagnosis not present

## 2020-07-22 DIAGNOSIS — R338 Other retention of urine: Secondary | ICD-10-CM | POA: Diagnosis present

## 2020-07-22 DIAGNOSIS — I10 Essential (primary) hypertension: Secondary | ICD-10-CM | POA: Diagnosis present

## 2020-07-22 DIAGNOSIS — K529 Noninfective gastroenteritis and colitis, unspecified: Secondary | ICD-10-CM | POA: Diagnosis present

## 2020-07-22 DIAGNOSIS — Z781 Physical restraint status: Secondary | ICD-10-CM | POA: Diagnosis not present

## 2020-07-22 DIAGNOSIS — Z7982 Long term (current) use of aspirin: Secondary | ICD-10-CM | POA: Diagnosis not present

## 2020-07-22 DIAGNOSIS — E119 Type 2 diabetes mellitus without complications: Secondary | ICD-10-CM | POA: Diagnosis present

## 2020-07-22 LAB — GLUCOSE, CAPILLARY
Glucose-Capillary: 125 mg/dL — ABNORMAL HIGH (ref 70–99)
Glucose-Capillary: 121 mg/dL — ABNORMAL HIGH (ref 70–99)
Glucose-Capillary: 126 mg/dL — ABNORMAL HIGH (ref 70–99)

## 2020-07-22 LAB — FERRITIN: Ferritin: 86 ng/mL (ref 24–336)

## 2020-07-22 LAB — RESP PANEL BY RT-PCR (FLU A&B, COVID) ARPGX2
Influenza A by PCR: NEGATIVE
Influenza B by PCR: NEGATIVE
SARS Coronavirus 2 by RT PCR: POSITIVE — AB

## 2020-07-22 LAB — ABO/RH: ABO/RH(D): A NEG

## 2020-07-22 LAB — C-REACTIVE PROTEIN: CRP: 0.6 mg/dL (ref <1.0)

## 2020-07-22 LAB — FIBRINOGEN: Fibrinogen: 377 mg/dL (ref 210–475)

## 2020-07-22 LAB — BRAIN NATRIURETIC PEPTIDE: B Natriuretic Peptide: 54.2 pg/mL (ref 0.0–100.0)

## 2020-07-22 LAB — D-DIMER, QUANTITATIVE: D-Dimer, Quant: 1.94 ug/mL-FEU — ABNORMAL HIGH (ref 0.00–0.50)

## 2020-07-22 LAB — PROCALCITONIN: Procalcitonin: <0.1 ng/mL

## 2020-07-22 MED ORDER — HALOPERIDOL LACTATE 5 MG/ML IJ SOLN
1.0000 mg | Freq: Four times a day (QID) | INTRAMUSCULAR | Status: DC | PRN
  Administered 2020-07-22 – 2020-07-29 (×10): 1 mg via INTRAVENOUS
  Filled 2020-07-22 (×10): qty 1

## 2020-07-22 MED ORDER — VALPROATE SODIUM 100 MG/ML IV SOLN
250.0000 mg | Freq: Two times a day (BID) | INTRAVENOUS | Status: DC
  Administered 2020-07-22 (×2): 250 mg via INTRAVENOUS
  Filled 2020-07-22 (×5): qty 2.5

## 2020-07-22 MED ORDER — FLUTICASONE PROPIONATE 50 MCG/ACT NA SUSP
2.0000 | Freq: Every day | NASAL | Status: DC
  Administered 2020-07-23 – 2020-07-30 (×6): 2 via NASAL
  Filled 2020-07-22: qty 16

## 2020-07-22 MED ORDER — HALOPERIDOL LACTATE 5 MG/ML IJ SOLN
5.0000 mg | Freq: Four times a day (QID) | INTRAMUSCULAR | Status: AC | PRN
Start: 2020-07-22 — End: 2020-07-22
  Administered 2020-07-22 (×2): 5 mg via INTRAVENOUS
  Filled 2020-07-22 (×3): qty 1

## 2020-07-22 MED ORDER — ALUM & MAG HYDROXIDE-SIMETH 200-200-20 MG/5ML PO SUSP: 30.0000 mL | Freq: Four times a day (QID) | ORAL | Status: DC | PRN

## 2020-07-22 MED ORDER — ONDANSETRON HCL 4 MG PO TABS: 4.0000 mg | ORAL_TABLET | Freq: Four times a day (QID) | ORAL | Status: DC | PRN

## 2020-07-22 MED ORDER — AMLODIPINE BESYLATE 10 MG PO TABS
10.0000 mg | ORAL_TABLET | Freq: Every day | ORAL | Status: DC
  Filled 2020-07-22: qty 1

## 2020-07-22 MED ORDER — GUAIFENESIN-DM 100-10 MG/5ML PO SYRP: 10.0000 mL | ORAL_SOLUTION | ORAL | Status: DC | PRN

## 2020-07-22 MED ORDER — VITAMIN B-12 1000 MCG PO TABS
1000.0000 ug | ORAL_TABLET | Freq: Every day | ORAL | Status: DC
  Filled 2020-07-22: qty 1

## 2020-07-22 MED ORDER — TAMSULOSIN HCL 0.4 MG PO CAPS
0.4000 mg | ORAL_CAPSULE | Freq: Every day | ORAL | Status: DC
  Administered 2020-07-23 – 2020-07-30 (×8): 0.4 mg via ORAL
  Filled 2020-07-22 (×9): qty 1

## 2020-07-22 MED ORDER — SODIUM CHLORIDE 0.9 % IV SOLN
200.0000 mg | Freq: Once | INTRAVENOUS | Status: AC
  Administered 2020-07-22: 200 mg via INTRAVENOUS
  Filled 2020-07-22: qty 40

## 2020-07-22 MED ORDER — ZINC SULFATE 220 (50 ZN) MG PO CAPS
220.0000 mg | ORAL_CAPSULE | Freq: Every day | ORAL | Status: DC
  Filled 2020-07-22: qty 1

## 2020-07-22 MED ORDER — QUETIAPINE FUMARATE 50 MG PO TABS
50.0000 mg | ORAL_TABLET | Freq: Every day | ORAL | Status: DC
  Administered 2020-07-22 – 2020-07-29 (×9): 50 mg via ORAL
  Filled 2020-07-22 (×9): qty 1

## 2020-07-22 MED ORDER — ONDANSETRON HCL 4 MG/2ML IJ SOLN: 4.0000 mg | Freq: Four times a day (QID) | INTRAMUSCULAR | Status: DC | PRN

## 2020-07-22 MED ORDER — ATORVASTATIN CALCIUM 40 MG PO TABS: 40.0000 mg | ORAL_TABLET | Freq: Every day | ORAL | Status: DC

## 2020-07-22 MED ORDER — ACETAMINOPHEN 325 MG PO TABS
650.0000 mg | ORAL_TABLET | Freq: Four times a day (QID) | ORAL | Status: DC | PRN
  Filled 2020-07-22: qty 2

## 2020-07-22 MED ORDER — CIPROFLOXACIN IN D5W 400 MG/200ML IV SOLN
400.0000 mg | Freq: Two times a day (BID) | INTRAVENOUS | Status: DC
  Administered 2020-07-22: 400 mg via INTRAVENOUS
  Filled 2020-07-22: qty 200

## 2020-07-22 MED ORDER — ASCORBIC ACID 500 MG PO TABS
500.0000 mg | ORAL_TABLET | Freq: Every day | ORAL | Status: DC
  Filled 2020-07-22: qty 1

## 2020-07-22 MED ORDER — HYDRALAZINE HCL 50 MG PO TABS
100.0000 mg | ORAL_TABLET | Freq: Three times a day (TID) | ORAL | Status: DC
  Filled 2020-07-22: qty 2

## 2020-07-22 MED ORDER — METRONIDAZOLE 500 MG/100ML IV SOLN
500.0000 mg | Freq: Three times a day (TID) | INTRAVENOUS | Status: DC
  Administered 2020-07-22: 500 mg via INTRAVENOUS
  Filled 2020-07-22: qty 100

## 2020-07-22 MED ORDER — SODIUM CHLORIDE 0.45 % IV SOLN: INTRAVENOUS | Status: DC

## 2020-07-22 MED ORDER — THIAMINE HCL 100 MG PO TABS
100.0000 mg | ORAL_TABLET | Freq: Every day | ORAL | Status: DC
  Filled 2020-07-22: qty 1

## 2020-07-22 MED ORDER — ASPIRIN EC 81 MG PO TBEC
81.0000 mg | DELAYED_RELEASE_TABLET | Freq: Every day | ORAL | Status: DC
  Administered 2020-07-23 – 2020-07-30 (×8): 81 mg via ORAL
  Filled 2020-07-22 (×9): qty 1

## 2020-07-22 MED ORDER — ALBUTEROL SULFATE (2.5 MG/3ML) 0.083% IN NEBU: 2.5000 mg | INHALATION_SOLUTION | RESPIRATORY_TRACT | Status: DC | PRN

## 2020-07-22 MED ORDER — CARVEDILOL 6.25 MG PO TABS
6.2500 mg | ORAL_TABLET | Freq: Two times a day (BID) | ORAL | Status: DC
  Filled 2020-07-22: qty 1

## 2020-07-22 MED ORDER — SODIUM CHLORIDE 0.9 % IV SOLN: 100.0000 mg | Freq: Every day | INTRAVENOUS | Status: DC

## 2020-07-22 MED ORDER — SENNA 8.6 MG PO TABS
2.0000 | ORAL_TABLET | Freq: Every day | ORAL | Status: DC
  Filled 2020-07-22: qty 2

## 2020-07-22 MED ORDER — ACETAMINOPHEN 650 MG RE SUPP: 650.0000 mg | Freq: Four times a day (QID) | RECTAL | Status: DC | PRN

## 2020-07-22 MED ORDER — DIPHENHYDRAMINE HCL 50 MG/ML IJ SOLN
25.0000 mg | Freq: Once | INTRAMUSCULAR | Status: AC
  Administered 2020-07-22: 25 mg via INTRAVENOUS
  Filled 2020-07-22: qty 1

## 2020-07-22 MED ORDER — PANTOPRAZOLE SODIUM 40 MG PO TBEC
40.0000 mg | DELAYED_RELEASE_TABLET | Freq: Every day | ORAL | Status: DC
  Filled 2020-07-22: qty 1

## 2020-07-22 MED ORDER — CHLORHEXIDINE GLUCONATE CLOTH 2 % EX PADS
6.0000 | MEDICATED_PAD | Freq: Every day | CUTANEOUS | Status: DC
  Administered 2020-07-23 – 2020-07-30 (×8): 6 via TOPICAL

## 2020-07-22 MED ORDER — DIVALPROEX SODIUM 125 MG PO CSDR
250.0000 mg | DELAYED_RELEASE_CAPSULE | Freq: Two times a day (BID) | ORAL | Status: DC
  Administered 2020-07-22: 250 mg via ORAL
  Filled 2020-07-22 (×2): qty 2

## 2020-07-22 MED ORDER — HYDROCOD POLST-CPM POLST ER 10-8 MG/5ML PO SUER: 5.0000 mL | Freq: Two times a day (BID) | ORAL | Status: DC | PRN

## 2020-07-22 MED ORDER — AMOXICILLIN-POT CLAVULANATE 875-125 MG PO TABS
1.0000 | ORAL_TABLET | Freq: Two times a day (BID) | ORAL | Status: DC
  Administered 2020-07-23 – 2020-07-24 (×3): 1 via ORAL
  Filled 2020-07-22 (×4): qty 1

## 2020-07-22 MED ORDER — LORAZEPAM 0.5 MG PO TABS
0.5000 mg | ORAL_TABLET | Freq: Three times a day (TID) | ORAL | Status: DC | PRN
  Administered 2020-07-22 – 2020-07-30 (×9): 0.5 mg via ORAL
  Filled 2020-07-22 (×10): qty 1

## 2020-07-22 MED ORDER — MAGNESIUM HYDROXIDE 400 MG/5ML PO SUSP: 30.0000 mL | Freq: Every evening | ORAL | Status: DC | PRN

## 2020-07-22 MED ORDER — METFORMIN HCL 500 MG PO TABS
1000.0000 mg | ORAL_TABLET | Freq: Two times a day (BID) | ORAL | Status: DC
  Filled 2020-07-22: qty 2

## 2020-07-22 MED ORDER — POLYETHYLENE GLYCOL 3350 17 G PO PACK: 17.0000 g | PACK | Freq: Every day | ORAL | Status: DC | PRN

## 2020-07-22 MED ORDER — ENSURE ENLIVE PO LIQD: 237.0000 mL | Freq: Two times a day (BID) | ORAL | Status: DC

## 2020-07-22 MED ORDER — ALBUTEROL SULFATE HFA 108 (90 BASE) MCG/ACT IN AERS
2.0000 | INHALATION_SPRAY | Freq: Four times a day (QID) | RESPIRATORY_TRACT | Status: DC
  Administered 2020-07-22 – 2020-07-23 (×2): 2 via RESPIRATORY_TRACT
  Filled 2020-07-22: qty 6.7

## 2020-07-22 NOTE — Hospital Course (Signed)
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 (prior MMSE 04/18/2020 at neurology office = 12/30) On dysphagia 3 diet at baseline DM TY 2 HTN Recent admission 4/14 through 07/14/2020-at that time was COVID-positive not hypoxic received remdesivir X2-work-up was done including work-up for meningitis and this was negative-palliative care psychiatry and neurology were involved in his care and he was discharged on Seroquel and Depakote to [facility] memory care unit  Returns to emergency room 5/26 with reported blood in stool-patient became quite agitated Patient received multiple doses of antipsychotics and anxiolytics including Geodon-CT scan eventually was performed showing?  Colitis hydronephrosis  Patient was admitted with presumed colitis Psychiatry was consulted for assistance with behavioral management  BUN/creatinine 16/0.9-->24/1.0 AST/ALT 62/52 WBC 10.1 hemoglobin 11.5 platelet 293 CT abdomen pelvis IMPRESSION: 1. Possible sigmoid colitis with mild wall thickening and edema. 2. 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 3. Distended urinary bladder with mild wall thickening. Prominence of both renal collecting systems is felt to be due to degree of bladder distension. 4. 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

## 2020-07-22 NOTE — Consult Note (Signed)
Reason for Consult: Agitated Delirium in patient with Vascular Dementia   HPI: Mr. Burich is a 80 yr old male who presented with blood in stools. PMH is significant for unspecified skin cancer, vascular dementia with behavioral disturbance, type II DM, hypertension who is brought to the emergency department due to having blood in stools.  The patient is currently sedated and unable to provide further information.  According to the [facility] staff he was having diarrhea episodes for the last 2 days so they sent him to the ED for further evaluation and would like him to be tested for C. difficile before he returns to the facility.  The patient's daughter has stated that the patient has had a distended abdomen for the past month or so.   Patient was restless in bed and unaware of his surroundings. He repeatedly mumbled about needing his boxing gloves off (patient had gloves over both hands). Attempted to redirect multiple times but he would not follow a conversation.   Nurse Tech reports that he has taken off his condom cath multiple times and urinated on the bed requiring multiple changes. His nurse reports that he has refused his medications and to eat anything.    Plan: Discussed case with Dr. Dwyane Dee  Will switch his Depakote from sprinkles to IV. Consulted pharmacy for conversion which is 1 to 1. Will start IV Depakote. Will benefit from continued delirium precautions. Will continue to monitor.   -Stop Depakote Sprinkles -Start IV Depakote 250 mg BID -Continue Seroquel 50 mg daily - If patient remains agitated and trying to remove medical monitoring devices or IVs, may need to consider discontinuing hiding unnecessary devices to further decrease delirium and agitation.   Fatima Sanger MD Resident

## 2020-07-22 NOTE — Progress Notes (Addendum)
Pt once again combative and trying to get out of bed. COVID PCR resulted positive after pt was admitted to this floor from the ED. Geodon and Ativan were given in the ED. Primary RN in pt's room trying to keep pt safe. MD paged by this RN for sitter order and PRN meds.

## 2020-07-22 NOTE — Progress Notes (Signed)
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 (prior MMSE 04/18/2020 at neurology office = 12/30) On dysphagia 3 diet at baseline DM TY 2 HTN Recent admission 4/14 through 07/14/2020-at that time was COVID-positive not hypoxic received remdesivir X2-work-up was done including work-up for meningitis and this was negative-palliative care psychiatry and neurology were involved in his care and he was discharged on Seroquel and Depakote to [facility] memory care unit  Returns to emergency room 5/26 with reported blood in stool-patient became quite agitated Patient received multiple doses of antipsychotics and anxiolytics including Geodon-CT scan eventually was performed showing?  Colitis hydronephrosis  Patient was admitted with presumed colitis Psychiatry was consulted for assistance with behavioral management  Hospital-Problem based course  ?  Colitis No stool since admission De-escalate IV antibiotics to Augmentin X2 more days and observe Stage VII dementia with superimposed delirium, vascular dementia prior MMSE 12/30 in 03/2020 Patient not really redirectable Appreciate psychiatry input-started Depakote, continue Seroquel As patient was still agitated I gave the patient 50 of Benadryl IV in addition to of Haldol and patient will remain on restraints I will talk to the family regarding goals of care Urinary retention Might need a foley catheter P afib in the past  DVT prophylaxis: scd Code Status: Full Family Communication: called Vladimir Creeks [phone] and updated fully Disposition:  Status is: Observation  The patient will require care spanning > 2 midnights and should be moved to inpatient because: Hemodynamically unstable, Persistent severe electrolyte disturbances, Altered mental status,  Unsafe d/c plan and IV treatments appropriate due to intensity of illness or inability to take PO  Dispo: The patient is from: Home              Anticipated d/c is to: ALF              Patient currently is not medically stable to d/c.   Difficult to place patient No  Consultants:   Pscyhiatry  Procedures:   Antimicrobials:  Changed to Augmentin    Subjective:  Combative and disoriented  Objective: Vitals:   07/22/20 0055 07/22/20 0400 07/22/20 0800 07/22/20 1135  BP: (!) 159/75 (!) 147/67 (!) 178/59 (!) 155/62  Pulse: 66 65 (!) 51 (!) 50  Resp: 18   16  Temp: (!) 97.5 F (36.4 C)   98.4 F (36.9 C)  TempSrc: Oral   Axillary  SpO2:  99% 98% 95%  Weight: 78.6 kg 78.9 kg      Intake/Output Summary (Last 24 hours) at 07/22/2020 1335 Last data filed at 07/22/2020 0824 Gross per 24 hour  Intake --  Output 1180 ml  Net -1180 ml   Filed Weights   07/22/20 0055 07/22/20 0400  Weight: 78.6 kg 78.9 kg    Examination:  Cannot obtain ROS Talking gibberish cta b abd soft No Le edema  Data Reviewed: personally reviewed   BUN/creatinine 16/0.9-->24/1.0 AST/ALT 62/52 WBC 10.1 hemoglobin 11.5 platelet 293 CT abdomen pelvis IMPRESSION: 1. Possible sigmoid colitis with mild wall thickening and edema. 2. 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 3. Distended urinary bladder with mild wall thickening. Prominence of both renal collecting systems is felt to be due to degree of bladder distension. 4. 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  Radiology Studies: CT ABDOMEN PELVIS WO CONTRAST  Result Date: 07/21/2020 CLINICAL DATA:  Abdominal distension.  Blood in stool. EXAM: CT ABDOMEN AND PELVIS WITHOUT CONTRAST TECHNIQUE: Multidetector CT imaging of the abdomen and pelvis was performed following the standard protocol without IV contrast. COMPARISON:  CT 10/30/2017 FINDINGS: Lower chest: Small right lower lobe  pulmonary nodules are unchanged from prior exam, benign. No acute airspace disease or pleural effusion. Stable heart size with coronary artery calcifications. Hepatobiliary: No focal liver abnormality is seen. No gallstones, gallbladder wall thickening, or biliary dilatation. Pancreas: No ductal dilatation or inflammation. Spleen: Normal in size without focal abnormality. Adrenals/Urinary Tract: Normal adrenal glands. Prominence of both renal collecting systems is felt to be due to degree of bladder distension. No evidence of renal or ureteral calculi. Bladder is distended with volume of 13.2 x 9.5 x 16 cm (volume = 1100 cm^3). Minimal wall thickening about the bladder dome. Possible bladder dome diverticulum. Cysts arising from the mid lower right kidney. Small hyperdense lesion in the upper left kidney is incompletely characterized. Stomach/Bowel: Small hiatal hernia. No small bowel obstruction or inflammation. Small volume of formed stool in the ascending and proximal transverse colon. More distal colon is decompressed which limits assessment for wall thickening. There may be mild sigmoid wall thickening and edema. Appendix not well visualized on the current exam, no appendicitis. Vascular/Lymphatic: Aortic atherosclerosis without aneurysm. Prominent retroperitoneal lymph nodes are similar to prior exam. Prominent central mesenteric nodes measuring up to 15 mm, series 3, image 36, also similar to prior exam. Mild chronic misty mesentery in the left upper quadrant. Reproductive: Enlarged prostate gland causing mass effect on the bladder base. Prostate spans 5.3 cm transverse. Other: No free air or ascites. Previous left inguinal hernia is no longer seen. Musculoskeletal: Degenerative change throughout the lumbar spine. There are no acute or suspicious osseous abnormalities. IMPRESSION: 1. Possible sigmoid colitis with mild wall thickening and edema. 2. Enlarged prostate gland causing mass effect on the bladder  base. 3. Distended urinary bladder with mild wall thickening. Prominence of both renal collecting systems is felt to be due to degree of bladder distension. 4. 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 Aortic Atherosclerosis (ICD10-I70.0). Electronically Signed   By: Keith Rake M.D.   On: 07/21/2020 22:47   Portable chest 1 View  Result Date: 07/22/2020 CLINICAL DATA:  Shortness of breath and COVID-19 EXAM: PORTABLE CHEST 1 VIEW COMPARISON:  Chest x-ray 07/04/2020, CT chest 05/01/2018 FINDINGS: The heart size and mediastinal contours are unchanged. Aortic calcifications. Wireless cardiac device overlies the left chest. No focal consolidation. No pulmonary edema. No pleural effusion. No pneumothorax. No acute osseous abnormality. IMPRESSION: No active disease. Electronically Signed   By: Iven Finn M.D.   On: 07/22/2020 02:48     Scheduled Meds: . albuterol  2 puff Inhalation Q6H  . amLODipine  10 mg Oral Daily  . vitamin C  500 mg Oral Daily  . aspirin EC  81 mg Oral Daily  . atorvastatin  40 mg Oral QHS  . carvedilol  6.25 mg Oral BID WC  . divalproex  250 mg Oral Q12H  . feeding supplement  237 mL Oral BID BM  . fluticasone  2 spray Each Nare Daily  . hydrALAZINE  100 mg Oral Q8H  . metFORMIN  1,000 mg Oral BID WC  . pantoprazole  40 mg Oral Daily  . QUEtiapine  50 mg Oral Q2000  . senna  2 tablet Oral Daily  . tamsulosin  0.4 mg Oral Daily  .  thiamine  100 mg Oral Daily  . cyanocobalamin  1,000 mcg Oral Daily  . zinc sulfate  220 mg Oral Daily   Continuous Infusions: . sodium chloride 88 mL/hr at 07/22/20 1308  . ciprofloxacin 400 mg (07/22/20 1148)  . metronidazole 500 mg (07/22/20 1150)     LOS: 0 days   Time spent: [place], MD [facility] To contact the attending provider between 7A-7P or the covering provider during after hours 7P-7A, please log into the web site [email or web address] and access using universal  [place] password for that web site. If you do not have the password, please call the hospital operator.  07/22/2020, 1:35 PM

## 2020-07-23 DIAGNOSIS — K529 Noninfective gastroenteritis and colitis, unspecified: Secondary | ICD-10-CM | POA: Diagnosis not present

## 2020-07-23 LAB — URINALYSIS, ROUTINE W REFLEX MICROSCOPIC
Bacteria, UA: NONE SEEN
Bilirubin Urine: NEGATIVE
Glucose, UA: NEGATIVE mg/dL
Ketones, ur: NEGATIVE mg/dL
Leukocytes,Ua: NEGATIVE
Nitrite: NEGATIVE
Protein, ur: NEGATIVE mg/dL
Specific Gravity, Urine: 1.01 (ref 1.005–1.030)
pH: 8 (ref 5.0–8.0)

## 2020-07-23 LAB — CBC WITH DIFFERENTIAL/PLATELET
Abs Immature Granulocytes: 0.02 10*3/uL (ref 0.00–0.07)
Basophils Absolute: 0 10*3/uL (ref 0.0–0.1)
Basophils Relative: 0 %
Eosinophils Absolute: 0.2 10*3/uL (ref 0.0–0.5)
Eosinophils Relative: 2 %
HCT: 33.4 % — ABNORMAL LOW (ref 39.0–52.0)
Hemoglobin: 11 g/dL — ABNORMAL LOW (ref 13.0–17.0)
Immature Granulocytes: 0 %
Lymphocytes Relative: 31 %
Lymphs Abs: 2.7 10*3/uL (ref 0.7–4.0)
MCH: 28.9 pg (ref 26.0–34.0)
MCHC: 32.9 g/dL (ref 30.0–36.0)
MCV: 87.7 fL (ref 80.0–100.0)
Monocytes Absolute: 0.6 10*3/uL (ref 0.1–1.0)
Monocytes Relative: 7 %
Neutro Abs: 5.4 10*3/uL (ref 1.7–7.7)
Neutrophils Relative %: 60 %
Platelets: 208 10*3/uL (ref 150–400)
RBC: 3.81 MIL/uL — ABNORMAL LOW (ref 4.22–5.81)
RDW: 14.4 % (ref 11.5–15.5)
WBC: 9 10*3/uL (ref 4.0–10.5)
nRBC: 0 % (ref 0.0–0.2)

## 2020-07-23 LAB — VALPROIC ACID LEVEL: Valproic Acid Lvl: 19 ug/mL — ABNORMAL LOW (ref 50.0–100.0)

## 2020-07-23 LAB — COMPREHENSIVE METABOLIC PANEL
ALT: 38 U/L (ref 0–44)
AST: 33 U/L (ref 15–41)
Albumin: 2.6 g/dL — ABNORMAL LOW (ref 3.5–5.0)
Alkaline Phosphatase: 34 U/L — ABNORMAL LOW (ref 38–126)
Anion gap: 6 (ref 5–15)
BUN: 11 mg/dL (ref 8–23)
CO2: 27 mmol/L (ref 22–32)
Calcium: 8.6 mg/dL — ABNORMAL LOW (ref 8.9–10.3)
Chloride: 107 mmol/L (ref 98–111)
Creatinine, Ser: 0.98 mg/dL (ref 0.61–1.24)
GFR, Estimated: >60 mL/min (ref >60)
Glucose, Bld: 115 mg/dL — ABNORMAL HIGH (ref 70–99)
Potassium: 3.3 mmol/L — ABNORMAL LOW (ref 3.5–5.1)
Sodium: 140 mmol/L (ref 135–145)
Total Bilirubin: 0.7 mg/dL (ref 0.3–1.2)
Total Protein: 4.7 g/dL — ABNORMAL LOW (ref 6.5–8.1)

## 2020-07-23 MED ORDER — ENSURE ENLIVE PO LIQD
237.0000 mL | Freq: Three times a day (TID) | ORAL | Status: DC
  Administered 2020-07-23 – 2020-07-30 (×18): 237 mL via ORAL

## 2020-07-23 MED ORDER — ADULT MULTIVITAMIN W/MINERALS CH
1.0000 | ORAL_TABLET | Freq: Every day | ORAL | Status: DC
  Administered 2020-07-23 – 2020-07-30 (×8): 1 via ORAL
  Filled 2020-07-23 (×9): qty 1

## 2020-07-23 MED ORDER — VALPROATE SODIUM 100 MG/ML IV SOLN
250.0000 mg | Freq: Two times a day (BID) | INTRAVENOUS | Status: DC
  Administered 2020-07-23 – 2020-07-25 (×4): 250 mg via INTRAVENOUS
  Filled 2020-07-23 (×5): qty 2.5

## 2020-07-23 MED ORDER — CARVEDILOL 3.125 MG PO TABS
3.1250 mg | ORAL_TABLET | Freq: Two times a day (BID) | ORAL | Status: DC
  Administered 2020-07-23 – 2020-07-30 (×14): 3.125 mg via ORAL
  Filled 2020-07-23 (×14): qty 1

## 2020-07-23 MED ORDER — POTASSIUM CHLORIDE CRYS ER 20 MEQ PO TBCR
40.0000 meq | EXTENDED_RELEASE_TABLET | Freq: Every day | ORAL | Status: DC
  Administered 2020-07-23 – 2020-07-30 (×8): 40 meq via ORAL
  Filled 2020-07-23 (×8): qty 2

## 2020-07-23 MED ORDER — DIVALPROEX SODIUM 125 MG PO CSDR
250.0000 mg | DELAYED_RELEASE_CAPSULE | Freq: Two times a day (BID) | ORAL | Status: DC
  Administered 2020-07-23: 250 mg via ORAL
  Filled 2020-07-23: qty 2

## 2020-07-23 NOTE — Progress Notes (Signed)
Misty, daughter mentioned to RN that depakote makes the patient angry and that lexapro helps him better in the past.

## 2020-07-23 NOTE — Progress Notes (Signed)
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 (prior MMSE 04/18/2020 at neurology office = 12/30) On dysphagia 3 diet at baseline DM TY 2 HTN Recent admission 4/14 through 07/14/2020-at that time was COVID-positive not hypoxic received remdesivir X2-work-up was done including work-up for meningitis and this was negative-palliative care psychiatry and neurology were involved in his care and he was discharged on Seroquel and Depakote to [place] memory care unit  Returns to emergency room 5/26 with reported blood in stool-patient became quite agitated Patient received multiple doses of antipsychotics and anxiolytics including Geodon-CT scan eventually was performed showing?  Colitis hydronephrosis  Patient was admitted with presumed colitis Psychiatry was consulted for assistance with behavioral management  Hospital-Problem based course  ?  Colitis No stool since admission\ No evidence of Colitis Augmentin X1 more day then stopStage VII dementia with superimposed delirium, vascular dementia prior MMSE 12/30 in 03/2020 Patient not really redirectable Appreciate psychiatry input-started Depakote, continue Seroquel May need adjustment of psychotropic meds-appreciate psychiatry follow-up-restarted IV Depakote-hopeful for redirectability-try to avoid mechanical restraints but if patient safety she will need to use  have placed closer to front of unit Acute urinary retention 5/26 Probably contributing to patient's delirium superimposed on dementia 700 cc retained indwelling Foley catheter placed P afib in the past  DVT prophylaxis: scd Code Status: Full Family Communication: called Vladimir Creeks [phone] 5/26 and updated fully about goals of care-patient has delirium superimposed on probable severe  dementia and given recent hospitalization and being outside of his usual environment, expect this will take several days to improve We will try to avoid further psychotropics however may be forced to use Haldol and Benadryl again if becomes agitated Disposition:  Status is: Observation  The patient will require care spanning > 2 midnights and should be moved to inpatient because: Hemodynamically unstable, Persistent severe electrolyte disturbances, Altered mental status, Unsafe d/c plan and IV treatments appropriate due to intensity of illness or inability to take PO  Dispo: The patient is from: Home              Anticipated d/c is to: ALF              Patient currently is not medically stable to d/c.   Difficult to place patient No  Consultants:   Pscyhiatry  Procedures:   Antimicrobials:  Changed to Augmentin    Subjective:  Disoriented Much more awake than this morning-multiple nurses at bedside Patient somewhat redirectable  Objective: Vitals:   07/23/20 0444 07/23/20 0802 07/23/20 0803 07/23/20 1302  BP: 137/63 (!) 142/64  (!) 152/83  Pulse: (!) 46 (!) 59 (!) 54 66  Resp: 18 19 16 16   Temp: 97.8 F (36.6 C) 98.4 F (36.9 C)  98.4 F (36.9 C)  TempSrc: Oral Axillary  Oral  SpO2: 98% 97%    Weight:        Intake/Output Summary (Last 24 hours) at 07/23/2020 1329 Last data filed at 07/23/2020 1249 Gross per 24 hour  Intake 1641.75 ml  Output 2850 ml  Net -1208.25 ml   Filed Weights   07/22/20 0055 07/22/20 0400 07/23/20 0247  Weight: 78.6 kg 78.9 kg 76.7 kg    Examination:  Cannot obtain ROS Confused Chest clear no added sound Patient is stark naked in the bed Foley catheter is in place he  has mittens on  Data Reviewed: personally reviewed   BUN/creatinine 16/0.9-->24/1.0-->11/0.9 AST/ALT 62/52 Potassium 3.3 WBC 10.1 hemoglobin 11.5 platelet 293   CT abdomen pelvis IMPRESSION: 1. Possible sigmoid colitis with mild wall thickening and edema. 2.  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 3. Distended urinary bladder with mild wall thickening. Prominence of both renal collecting systems is felt to be due to degree of bladder distension. 4. 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  Radiology Studies: CT ABDOMEN PELVIS WO CONTRAST  Result Date: 07/21/2020 CLINICAL DATA:  Abdominal distension.  Blood in stool. EXAM: CT ABDOMEN AND PELVIS WITHOUT CONTRAST TECHNIQUE: Multidetector CT imaging of the abdomen and pelvis was performed following the standard protocol without IV contrast. COMPARISON:  CT 10/30/2017 FINDINGS: Lower chest: Small right lower lobe pulmonary nodules are unchanged from prior exam, benign. No acute airspace disease or pleural effusion. Stable heart size with coronary artery calcifications. Hepatobiliary: No focal liver abnormality is seen. No gallstones, gallbladder wall thickening, or biliary dilatation. Pancreas: No ductal dilatation or inflammation. Spleen: Normal in size without focal abnormality. Adrenals/Urinary Tract: Normal adrenal glands. Prominence of both renal collecting systems is felt to be due to degree of bladder distension. No evidence of renal or ureteral calculi. Bladder is distended with volume of 13.2 x 9.5 x 16 cm (volume = 1100 cm^3). Minimal wall thickening about the bladder dome. Possible bladder dome diverticulum. Cysts arising from the mid lower right kidney. Small hyperdense lesion in the upper left kidney is incompletely characterized. Stomach/Bowel: Small hiatal hernia. No small bowel obstruction or inflammation. Small volume of formed stool in the ascending and proximal transverse colon. More distal colon is decompressed which limits assessment for wall thickening. There may be mild sigmoid wall thickening and edema. Appendix not well visualized on the current exam, no appendicitis. Vascular/Lymphatic: Aortic atherosclerosis without aneurysm.  Prominent retroperitoneal lymph nodes are similar to prior exam. Prominent central mesenteric nodes measuring up to 15 mm, series 3, image 36, also similar to prior exam. Mild chronic misty mesentery in the left upper quadrant. Reproductive: Enlarged prostate gland causing mass effect on the bladder base. Prostate spans 5.3 cm transverse. Other: No free air or ascites. Previous left inguinal hernia is no longer seen. Musculoskeletal: Degenerative change throughout the lumbar spine. There are no acute or suspicious osseous abnormalities. IMPRESSION: 1. Possible sigmoid colitis with mild wall thickening and edema. 2. 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 3. Distended urinary bladder with mild wall thickening. Prominence of both renal collecting systems is felt to be due to degree of bladder distension. 4. 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 Aortic Atherosclerosis (ICD10-I70.0). Electronically Signed   By: Keith Rake M.D.   On: 07/21/2020 22:47   Portable chest 1 View  Result Date: 07/22/2020 CLINICAL DATA:  Shortness of breath and COVID-19 EXAM: PORTABLE CHEST 1 VIEW COMPARISON:  Chest x-ray 07/04/2020, CT chest 05/01/2018 FINDINGS: The heart size and mediastinal contours are unchanged. Aortic calcifications. Wireless cardiac device overlies the left chest. No focal consolidation. No pulmonary edema. No pleural effusion. No pneumothorax. No acute osseous abnormality. IMPRESSION: No active disease. Electronically Signed   By: Iven Finn M.D.   On: 07/22/2020 02:48     Scheduled Meds: . amoxicillin-clavulanate  1 tablet Oral Q12H  . aspirin EC  81 mg Oral Daily  . carvedilol  6.25 mg Oral BID WC  . Chlorhexidine Gluconate Cloth  6 each  Topical Daily  . divalproex  250 mg Oral Q12H  . fluticasone  2 spray Each Nare Daily  . potassium chloride  40 mEq Oral Daily  . QUEtiapine  50 mg Oral Q2000  . tamsulosin  0.4 mg Oral Daily    Continuous Infusions: . sodium chloride 88 mL/hr at 07/23/20 1239     LOS: 1 day   Time spent: Gordon, MD [facility] To contact the attending provider between 7A-7P or the covering provider during after hours 7P-7A, please log into the web site [email or web address] and access using universal [place] password for that web site. If you do not have the password, please call the hospital operator.  07/23/2020, 1:29 PM

## 2020-07-23 NOTE — Consult Note (Signed)
Reason for Consult: Agitated Delirium in patient with Vascular Dementia   HPI: Patient was sleeping soundly when attempting to interview. Spoke with nurse who reports that patient has been much calmer with no incidents reported overnight per night nurse. Discussed with nurse plan to switch to Depakote Sprinkles to ensure he is eating and able to get his medications.    Plan: Case discussed with Dr. Dwyane Dee  As he has responded well to the IV Depakote will switch him back to Depakote Sprinkles to ensure he is able to take his medications. Discussed with Hospitalist that there is a chance for discharge today. As long as he is able to eat and take his medications then he is cleared from a Psych Perspective.   -Stop IV Depakote -Restart Depakote Sprinkles 250 mg BID -Continue Seroquel 50 mg daily   At this time Psychiatry will sign off. Thank you allowing [place] to participate in the care of this patient. If any further questions arise please reach out.  Fatima Sanger MD Resident

## 2020-07-23 NOTE — Progress Notes (Signed)
Spoke with daughter at bedside, [name], about concerns that memory care was not accepting pt back. Discussed TOC plans and informed daughter that Thedore Mins, LCSW had been notified of denial and we were awaiting information on refusal. TOC will continue to follow.

## 2020-07-23 NOTE — TOC Initial Note (Addendum)
Transition of Care [facility]) - Initial/Assessment Note    Patient Details  Name: Patrick Phillips MRN: [id number] Date of Birth: 1940-08-03  Transition of [facility]) CM/SW Contact:    Tresa Endo Phone Number: 07/23/2020, 12:25 PM  Clinical Narrative:                 6440: CSW contacted [place] to inquire about pt returning with foley to their memory care unit. There was no answer CSW left a VM and will follow up.  1320: CSW spoke with [facility] facility is not willing to accept pt back. They are not able to meet the needs of the pt.        Patient Goals and CMS Choice        Expected Discharge Plan and Services                                                Prior Living Arrangements/Services                       Activities of Daily Living   ADL Screening (condition at time of admission) Patient's cognitive ability adequate to safely complete daily activities?: No Is the patient deaf or have difficulty hearing?: No Does the patient have difficulty seeing, even when wearing glasses/contacts?: No Does the patient have difficulty concentrating, remembering, or making decisions?: Yes Patient able to express need for assistance with ADLs?: No Does the patient have difficulty dressing or bathing?: Yes Independently performs ADLs?: No Communication: Needs assistance Is this a change from baseline?: Pre-admission baseline Dressing (OT): Needs assistance Is this a change from baseline?: Pre-admission baseline Grooming: Needs assistance Is this a change from baseline?: Pre-admission baseline Feeding: Needs assistance Is this a change from baseline?: Pre-admission baseline Bathing: Needs assistance Is this a change from baseline?: Pre-admission baseline Toileting: Needs assistance Is this a change from baseline?: Pre-admission baseline In/Out Bed: Needs assistance Is this a change from baseline?: Pre-admission baseline Walks in Home:  Needs assistance Weakness of Legs: Both  Permission Sought/Granted                  Emotional Assessment              Admission diagnosis:  Acute colitis [K52.9] Colitis, acute [K52.9] Patient Active Problem List   Diagnosis Date Noted  . Acute colitis 07/21/2020  . Volume depletion 07/21/2020  . Normocytic anemia 07/21/2020  . Malnutrition of moderate degree 06/30/2020  . Acute metabolic encephalopathy 34/74/2595  . COVID-19 virus infection 06/11/2020  . Vascular dementia ([place]) 06/11/2020  . Acute ischemic stroke ([place]) 07/24/2017  . Chest pain 12/22/2015  . Chronic back pain 12/22/2015  . Syncope 10/17/2013  . Sinus bradycardia 10/17/2013  . SOB (shortness of breath) 10/17/2013  . Hypertension   . Type 2 diabetes mellitus ([place])    PCP:  [name], MD Pharmacy:   [facility], [place] [address] Phone: [phone] Fax: [phone]     Social Determinants of Health (SDOH) Interventions    Readmission Risk Interventions No flowsheet data found.

## 2020-07-23 NOTE — Progress Notes (Addendum)
Patient is awake and has gotten out of the bed. Patient refuses to go back to bed and would like his mittens off. Nursing staff was able to remind the patient his whereabouts and eventually got the patient back into bed. 1mg  IV haldol prn given, Samtani MD at the bedside. Pashayan MD has been paged and made aware.

## 2020-07-23 NOTE — Progress Notes (Signed)
Informed by Hospitalist that patient will not be able to return to [facility] unit. Informed by nurse that patient has not eaten any of his breakfast and not had any of his morning dose of Depakote.   Plan: -Stop Depakote Sprinkle -Start IV Depakote 250 mg BID -Continue Seroquel 50 mg daily -Hospitalist will replace Consult order so patient can continue to be managed while disposition is confirmed.

## 2020-07-23 NOTE — Progress Notes (Signed)
Initial Nutrition Assessment  DOCUMENTATION CODES:   Not applicable  INTERVENTION:   -Ensure Enlive po TID, each supplement provides 350 kcal and 20 grams of protein -Magic cup TID with meals, each supplement provides 290 kcal and 9 grams of protein -MVI with minerals daily -Feeding assistance with meals  NUTRITION DIAGNOSIS:   Inadequate oral intake related to lethargy/confusion as evidenced by meal completion < 25%.  GOAL:   Patient will meet greater than or equal to 90% of their needs  MONITOR:   PO intake,Supplement acceptance,Labs,Weight trends,Skin,I & O's  REASON FOR ASSESSMENT:   Malnutrition Screening Tool    ASSESSMENT:   Patrick Phillips is a 80 y.o. male with medical history significant of unspecified skin cancer, vascular dementia with behavioral disturbance, type II DM, hypertension who is brought to the emergency department due to having blood in stools.  The patient is currently sedated and unable to provide further information.  According to the [place] staff he was having diarrhea episodes for the last 2 days so they sent him to the ED for further evaluation and would like him to be tested for C. difficile before he returns to the facility.  The patient's daughter has stated that the patient has had a distended abdomen for the past month or so.  Pt admitted with acute colitis.   Reviewed I.O's: -248 ml x 24 hours and -748 ml since admission  UOP: 2.2 L x 24 hours  Pt unavailable at time of visit. Pt in with MD and multiple RNs. Per RN notes, pt was agitated, pulling at tubes and lines yesterday.   Per MD notes, pt has not been eating or taking medications. Meal completions 0%.  Reviewed wt hx; pt has experienced a 11% wt loss over the past 3 months, which is significant for time frame.   Pt with history of malnutrition. RD suspects this is ongoing.   Medications reviewed and include 0.45% sodium chloride infusion @ 88 ml/hr.   Labs reviewed: K:  3.3, CBGS: 121-126.  Diet Order:   Diet Order            DIET SOFT Room service appropriate? Yes; Fluid consistency: Thin  Diet effective now                 EDUCATION NEEDS:   No education needs have been identified at this time  Skin:  Skin Assessment: Skin Integrity Issues: Skin Integrity Issues:: Other (Comment) Other: MASD anus, bil;ateral buttocks, scrotum; non-pressure wound to lt elbow  Last BM:  Unknown  Height:   Ht Readings from Last 1 Encounters:  06/10/20 6' (1.829 m)    Weight:   Wt Readings from Last 1 Encounters:  07/23/20 76.7 kg    Ideal Body Weight:  80.9 kg  BMI:  Body mass index is 22.93 kg/m.  Estimated Nutritional Needs:   Kcal:  2300-2500  Protein:  115-130 grams  Fluid:  > 2 L    Loistine Chance, RD, LDN, [place] Registered Dietitian II Certified Diabetes Care and Education Specialist Please refer to [facility] for RD and/or RD on-call/weekend/after hours pager

## 2020-07-23 NOTE — Progress Notes (Signed)
MD, pt was very agitated and combative at staff and pulling on tubings at the beginning of the night.  Pt did receive Haldol IV around 5pm, and it did not start working until around 9pm, which pt finally took a nap for about an 1-2 hrs, then woke up agitated again.  RN was able to give his Ativan and Seroquel and shortly after this he went back to sleep and stayed asleep the whole night and continued to sleep throughout the morning, will continue to monitor, Thanks Arvella Nigh RN.

## 2020-07-24 DIAGNOSIS — F0151 Vascular dementia with behavioral disturbance: Secondary | ICD-10-CM | POA: Diagnosis not present

## 2020-07-24 DIAGNOSIS — F0391 Unspecified dementia with behavioral disturbance: Secondary | ICD-10-CM | POA: Diagnosis present

## 2020-07-24 DIAGNOSIS — K529 Noninfective gastroenteritis and colitis, unspecified: Secondary | ICD-10-CM | POA: Diagnosis not present

## 2020-07-24 DIAGNOSIS — F03918 Unspecified dementia, unspecified severity, with other behavioral disturbance: Secondary | ICD-10-CM | POA: Diagnosis present

## 2020-07-24 LAB — COMPREHENSIVE METABOLIC PANEL
ALT: 38 U/L (ref 0–44)
AST: 27 U/L (ref 15–41)
Albumin: 2.6 g/dL — ABNORMAL LOW (ref 3.5–5.0)
Alkaline Phosphatase: 37 U/L — ABNORMAL LOW (ref 38–126)
Anion gap: 5 (ref 5–15)
BUN: 11 mg/dL (ref 8–23)
CO2: 26 mmol/L (ref 22–32)
Calcium: 8.8 mg/dL — ABNORMAL LOW (ref 8.9–10.3)
Chloride: 107 mmol/L (ref 98–111)
Creatinine, Ser: 0.82 mg/dL (ref 0.61–1.24)
GFR, Estimated: >60 mL/min (ref >60)
Glucose, Bld: 127 mg/dL — ABNORMAL HIGH (ref 70–99)
Potassium: 3.7 mmol/L (ref 3.5–5.1)
Sodium: 138 mmol/L (ref 135–145)
Total Bilirubin: 0.6 mg/dL (ref 0.3–1.2)
Total Protein: 4.8 g/dL — ABNORMAL LOW (ref 6.5–8.1)

## 2020-07-24 LAB — CBC WITH DIFFERENTIAL/PLATELET
Abs Immature Granulocytes: 0.02 10*3/uL (ref 0.00–0.07)
Basophils Absolute: 0 10*3/uL (ref 0.0–0.1)
Basophils Relative: 0 %
Eosinophils Absolute: 0.3 10*3/uL (ref 0.0–0.5)
Eosinophils Relative: 4 %
HCT: 35.3 % — ABNORMAL LOW (ref 39.0–52.0)
Hemoglobin: 11.6 g/dL — ABNORMAL LOW (ref 13.0–17.0)
Immature Granulocytes: 0 %
Lymphocytes Relative: 32 %
Lymphs Abs: 2.8 10*3/uL (ref 0.7–4.0)
MCH: 28.8 pg (ref 26.0–34.0)
MCHC: 32.9 g/dL (ref 30.0–36.0)
MCV: 87.6 fL (ref 80.0–100.0)
Monocytes Absolute: 0.6 10*3/uL (ref 0.1–1.0)
Monocytes Relative: 6 %
Neutro Abs: 5.1 10*3/uL (ref 1.7–7.7)
Neutrophils Relative %: 58 %
Platelets: 218 10*3/uL (ref 150–400)
RBC: 4.03 MIL/uL — ABNORMAL LOW (ref 4.22–5.81)
RDW: 14.2 % (ref 11.5–15.5)
WBC: 8.9 10*3/uL (ref 4.0–10.5)
nRBC: 0 % (ref 0.0–0.2)

## 2020-07-24 LAB — GLUCOSE, CAPILLARY: Glucose-Capillary: 185 mg/dL — ABNORMAL HIGH (ref 70–99)

## 2020-07-24 MED ORDER — ESCITALOPRAM OXALATE 10 MG PO TABS
10.0000 mg | ORAL_TABLET | Freq: Every day | ORAL | Status: DC
  Administered 2020-07-24 – 2020-07-30 (×7): 10 mg via ORAL
  Filled 2020-07-24 (×7): qty 1

## 2020-07-24 MED ORDER — ESCITALOPRAM OXALATE 10 MG PO TABS: 5.0000 mg | ORAL_TABLET | Freq: Every day | ORAL | Status: DC

## 2020-07-24 NOTE — Consult Note (Signed)
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 male patient admitted with blood in the stool.  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 Patient was recently discharged from [facility] where he was admitted from 4/14 through 07/14/2020. He was  discharged  to [facility] memory care unit, where he was living before he was brought to the hospital for current admission. Patient is a poor historian, collateral information was obtained from his daughter(Misty Megan Salon) who reports that patient became agitated due full bladder to which she called the attention of patient's care giver but nothing was done. She also reports that patient did well on Lexapro and Seroquel in the past. But became more irritable and agitated when he had a trial of Depakote in the past. Today, patient is alert but only oriented to person, not to place and time. He is calm, cooperative, denies psychosis, delusions and self harming thoughts.  Past Psychiatric History: as above  Risk to Self:  denies Risk to Others:  denies Prior Inpatient Therapy:   not applicable Prior Outpatient Therapy:    Past Medical History:  Past Medical History:  Diagnosis Date  . Cancer ([place])    skin cancer  . Dementia ([place])    . Diabetes mellitus without complication ([place])   . Hypertension   . Memory deficit     Past Surgical History:  Procedure Laterality Date  . BACK SURGERY    . HERNIA REPAIR    . LOOP RECORDER INSERTION N/A 07/26/2017   Procedure: LOOP RECORDER INSERTION;  Surgeon: Evans Lance, MD;  Location: [facility];  Service: Cardiovascular;  Laterality: N/A;  . TEE WITHOUT CARDIOVERSION N/A 07/26/2017   Procedure: TRANSESOPHAGEAL ECHOCARDIOGRAM (TEE);  Surgeon: Fay Records, MD;  Location: [facility] ENDOSCOPY;  Service: Cardiovascular;  Laterality: N/A;  . TRIGGER FINGER RELEASE Right 12/02/2015   Procedure: RIGHT THUMB RELEASE TRIGGER FINGER/A-1 PULLEY;  Surgeon: Leanora Cover, MD;  Location: [facility];  Service: Orthopedics;  Laterality: Right;   Family History:  Family History  Problem Relation Age of Onset  . Cancer Mother   . Cancer Father   . Cancer Brother   . Cancer Other    Family Psychiatric  History: Social History:  Social History   Substance and Sexual Activity  Alcohol Use Yes   Comment: occ     Social History   Substance and Sexual Activity  Drug Use No    Social History   Socioeconomic History  . Marital status: Married    Spouse name: Not on file  . Number of children: Not on file  . Years of education: Not on file  . Highest education level: Not on file  Occupational History  . Not on file  Tobacco Use  . Smoking status: Never Smoker  . Smokeless tobacco: Never Used  Vaping Use  . Vaping Use: Never used  Substance and Sexual Activity  . Alcohol use: Yes    Comment: occ  . Drug use: No  . Sexual activity: Not on file  Other Topics Concern  . Not on file  Social History Narrative  . Not on file   Social Determinants of Health   Financial Resource Strain: Not on file  Food Insecurity: Not on file  Transportation Needs: Not on file  Physical Activity: Not on file  Stress: Not on file  Social Connections: Not on file    Additional Social History:    Allergies:   Allergies  Allergen Reactions  . Aricept [Donepezil]     Per daughter, increased mood swings and angry    Labs:  Results for orders placed or performed during the hospital encounter of 07/21/20 (from the past 48 hour(s))  Glucose, capillary     Status: Abnormal   Collection Time: 07/22/20  6:11 PM  Result Value Ref Range   Glucose-Capillary 126 (H) 70 - 99 mg/dL    Comment: Glucose reference range applies only to samples taken after fasting for at least 8 hours.  Urinalysis, Routine w reflex microscopic Urine, Catheterized     Status: Abnormal   Collection Time: 07/22/20 11:00 PM  Result Value Ref Range   Color, Urine YELLOW YELLOW   APPearance CLEAR CLEAR   Specific Gravity, Urine 1.010 1.005 - 1.030   pH 8.0 5.0 - 8.0   Glucose, UA NEGATIVE NEGATIVE mg/dL   Hgb urine dipstick SMALL (A) NEGATIVE   Bilirubin Urine NEGATIVE NEGATIVE   Ketones, ur NEGATIVE NEGATIVE mg/dL   Protein, ur NEGATIVE NEGATIVE mg/dL   Nitrite NEGATIVE NEGATIVE   Leukocytes,Ua NEGATIVE NEGATIVE   RBC / HPF 21-50 0 - 5 RBC/hpf   WBC, UA 0-5 0 - 5 WBC/hpf   Bacteria, UA NONE SEEN NONE SEEN   Squamous Epithelial / LPF 0-5 0 - 5   Mucus PRESENT     Comment: Performed at [facility], [place] [address], [place], [place] [address]  Comprehensive metabolic panel     Status: Abnormal   Collection Time: 07/23/20  2:56 AM  Result Value Ref Range   Sodium 140 135 - 145 mmol/L   Potassium 3.3 (L) 3.5 - 5.1 mmol/L   Chloride 107 98 - 111 mmol/L   CO2 27 22 - 32 mmol/L   Glucose, Bld 115 (H) 70 - 99 mg/dL    Comment: Glucose reference range applies only to samples taken after fasting for at least 8 hours.   BUN 11 8 - 23 mg/dL   Creatinine, Ser 0.98 0.61 - 1.24 mg/dL   Calcium 8.6 (L) 8.9 - 10.3 mg/dL   Total Protein 4.7 (L) 6.5 - 8.1 g/dL   Albumin 2.6 (L) 3.5 - 5.0 g/dL   AST 33 15 - 41 U/L   ALT 38 0 - 44 U/L   Alkaline Phosphatase 34 (L) 38 - 126 U/L    Total Bilirubin 0.7 0.3 - 1.2 mg/dL   GFR, Estimated >60 >60 mL/min    Comment: (NOTE) Calculated using the CKD-EPI Creatinine Equation (2021)    Anion gap 6 5 - 15    Comment: Performed at [facility], [place] [address], [place], [place] [address]  CBC with Differential/Platelet     Status: Abnormal   Collection Time: 07/23/20  2:56 AM  Result Value Ref Range   WBC 9.0 4.0 - 10.5 K/uL  RBC 3.81 (L) 4.22 - 5.81 MIL/uL   Hemoglobin 11.0 (L) 13.0 - 17.0 g/dL   HCT 33.4 (L) 39.0 - 52.0 %   MCV 87.7 80.0 - 100.0 fL   MCH 28.9 26.0 - 34.0 pg   MCHC 32.9 30.0 - 36.0 g/dL   RDW 14.4 11.5 - 15.5 %   Platelets 208 150 - 400 K/uL   nRBC 0.0 0.0 - 0.2 %   Neutrophils Relative % 60 %   Neutro Abs 5.4 1.7 - 7.7 K/uL   Lymphocytes Relative 31 %   Lymphs Abs 2.7 0.7 - 4.0 K/uL   Monocytes Relative 7 %   Monocytes Absolute 0.6 0.1 - 1.0 K/uL   Eosinophils Relative 2 %   Eosinophils Absolute 0.2 0.0 - 0.5 K/uL   Basophils Relative 0 %   Basophils Absolute 0.0 0.0 - 0.1 K/uL   Immature Granulocytes 0 %   Abs Immature Granulocytes 0.02 0.00 - 0.07 K/uL    Comment: Performed at [facility] [address], [place], [place] [address]  Valproic acid level     Status: Abnormal   Collection Time: 07/23/20  2:56 AM  Result Value Ref Range   Valproic Acid Lvl 19 (L) 50.0 - 100.0 ug/mL    Comment: Performed at [facility] [address] [place], [place] [address]  CBC with Differential/Platelet     Status: Abnormal   Collection Time: 07/24/20  2:20 AM  Result Value Ref Range   WBC 8.9 4.0 - 10.5 K/uL   RBC 4.03 (L) 4.22 - 5.81 MIL/uL   Hemoglobin 11.6 (L) 13.0 - 17.0 g/dL   HCT 35.3 (L) 39.0 - 52.0 %   MCV 87.6 80.0 - 100.0 fL   MCH 28.8 26.0 - 34.0 pg   MCHC 32.9 30.0 - 36.0 g/dL   RDW 14.2 11.5 - 15.5 %   Platelets 218 150 - 400 K/uL   nRBC 0.0 0.0 - 0.2 %   Neutrophils Relative % 58 %   Neutro Abs 5.1 1.7 - 7.7 K/uL   Lymphocytes Relative 32 %   Lymphs Abs 2.8  0.7 - 4.0 K/uL   Monocytes Relative 6 %   Monocytes Absolute 0.6 0.1 - 1.0 K/uL   Eosinophils Relative 4 %   Eosinophils Absolute 0.3 0.0 - 0.5 K/uL   Basophils Relative 0 %   Basophils Absolute 0.0 0.0 - 0.1 K/uL   Immature Granulocytes 0 %   Abs Immature Granulocytes 0.02 0.00 - 0.07 K/uL    Comment: Performed at [facility], [address], [place], [place] [address]  Comprehensive metabolic panel     Status: Abnormal   Collection Time: 07/24/20  2:20 AM  Result Value Ref Range   Sodium 138 135 - 145 mmol/L   Potassium 3.7 3.5 - 5.1 mmol/L   Chloride 107 98 - 111 mmol/L   CO2 26 22 - 32 mmol/L   Glucose, Bld 127 (H) 70 - 99 mg/dL    Comment: Glucose reference range applies only to samples taken after fasting for at least 8 hours.   BUN 11 8 - 23 mg/dL   Creatinine, Ser 0.82 0.61 - 1.24 mg/dL   Calcium 8.8 (L) 8.9 - 10.3 mg/dL   Total Protein 4.8 (L) 6.5 - 8.1 g/dL   Albumin 2.6 (L) 3.5 - 5.0 g/dL   AST 27 15 - 41 U/L   ALT 38 0 - 44 U/L   Alkaline Phosphatase 37 (L)  38 - 126 U/L   Total Bilirubin 0.6 0.3 - 1.2 mg/dL   GFR, Estimated >60 >60 mL/min    Comment: (NOTE) Calculated using the CKD-EPI Creatinine Equation (2021)    Anion gap 5 5 - 15    Comment: Performed at [facility] [address], [place], [place] [address]    Current Facility-Administered Medications  Medication Dose Route Frequency Provider Last Rate Last Admin  . acetaminophen (TYLENOL) tablet 650 mg  650 mg Oral Q6H PRN Reubin Milan, MD       Or  . acetaminophen (TYLENOL) suppository 650 mg  650 mg Rectal Q6H PRN Reubin Milan, MD      . albuterol (PROVENTIL) (2.5 MG/3ML) 0.083% nebulizer solution 2.5 mg  2.5 mg Nebulization Q4H PRN Reubin Milan, MD      . aspirin EC tablet 81 mg  81 mg Oral Daily Reubin Milan, MD   81 mg at 07/24/20 0946  . carvedilol (COREG) tablet 3.125 mg  3.125 mg Oral BID WC Nita Sells, MD   3.125 mg at 07/24/20 0946  .  Chlorhexidine Gluconate Cloth 2 % PADS 6 each  6 each Topical Daily Nita Sells, MD   6 each at 07/24/20 0947  . escitalopram (LEXAPRO) tablet 10 mg  10 mg Oral Daily Danessa Mensch, MD      . feeding supplement (ENSURE ENLIVE / ENSURE PLUS) liquid 237 mL  237 mL Oral TID BM Nita Sells, MD   237 mL at 07/24/20 1245  . fluticasone (FLONASE) 50 MCG/ACT nasal spray 2 spray  2 spray Each Nare Daily Reubin Milan, MD   2 spray at 07/23/20 1305  . guaiFENesin-dextromethorphan (ROBITUSSIN DM) 100-10 MG/5ML syrup 10 mL  10 mL Oral Q4H PRN Reubin Milan, MD      . haloperidol lactate (HALDOL) injection 1 mg  1 mg Intravenous Q6H PRN Nita Sells, MD   1 mg at 07/24/20 1351  . LORazepam (ATIVAN) tablet 0.5 mg  0.5 mg Oral Q8H PRN Reubin Milan, MD   0.5 mg at 07/22/20 2252  . multivitamin with minerals tablet 1 tablet  1 tablet Oral Daily Nita Sells, MD   1 tablet at 07/24/20 0946  . ondansetron (ZOFRAN) injection 4 mg  4 mg Intravenous Q6H PRN Reubin Milan, MD      . potassium chloride SA (KLOR-CON) CR tablet 40 mEq  40 mEq Oral Daily Nita Sells, MD   40 mEq at 07/24/20 0946  . QUEtiapine (SEROQUEL) tablet 50 mg  50 mg Oral Q2000 Lalanya Rufener, MD   50 mg at 07/23/20 2023  . tamsulosin (FLOMAX) capsule 0.4 mg  0.4 mg Oral Daily Reubin Milan, MD   0.4 mg at 07/24/20 0946  . valproate (DEPACON) 250 mg in dextrose 5 % 50 mL IVPB  250 mg Intravenous BID Briant Cedar, MD 52.5 mL/hr at 07/24/20 1000 250 mg at 07/24/20 1000    Musculoskeletal: Strength & Muscle Tone: not tested Gait & Station: unsteady Patient leans: N/A     Psychiatric Specialty Exam:  Presentation  General Appearance: Appropriate for Environment  Eye Contact:Fleeting  Speech:Clear and Coherent  Speech Volume:Decreased  Handedness:Right   Mood and Affect  Mood:Anxious  Affect:Restricted   Thought Process  Thought  Processes:Disorganized  Descriptions of Associations:Intact  Orientation:Partial  Thought Content:Illogical  History of Schizophrenia/Schizoaffective disorder:No data recorded Duration of Psychotic Symptoms:No data recorded Hallucinations:Hallucinations: None  Ideas of Reference:None  Suicidal Thoughts:Suicidal  Thoughts: No  Homicidal Thoughts:Homicidal Thoughts: No   Sensorium  Memory:Immediate Fair; Recent Poor; Remote Poor  Judgment:Impaired  Insight:Lacking   Executive Functions  Concentration:Poor  Attention Span:Poor  Recall:Poor  Fund of Knowledge:Poor  Language:Good   Psychomotor Activity  Psychomotor Activity:Psychomotor Activity: Normal; Restlessness   Assets  Assets:Social Support   Sleep  Sleep:Sleep: Fair   Physical Exam: Physical Exam Psychiatric:        Attention and Perception: Perception normal. He is inattentive.        Mood and Affect: Mood is anxious.        Speech: Speech normal.        Behavior: Behavior is slowed.        Thought Content: Thought content normal.        Cognition and Memory: Cognition normal. Memory is impaired.        Judgment: Judgment is impulsive.    Review of Systems  Psychiatric/Behavioral: The patient is nervous/anxious.    Blood pressure (!) 154/64, pulse 68, temperature 98.1 F (36.7 C), temperature source Oral, resp. rate 20, weight 77.1 kg, SpO2 93 %. Body mass index is 23.05 kg/m.  Treatment Plan Summary: 80 year old male with history of Dementia with behavioral disturbance who was admitted with agitation and blood in the stool. Today, he is calm, cooperative, denies psychosis and delusions. His daughter suggested a trial of Lexapro and Seroquel which helped patient in the past.  Recommendations: -Continue 1:1 sitter for safety -D/C Depakote -Do not place patient on Depakote-caused agitation in the past -Consider Lexapro 10 mg daily and Seroquel 50 mg at bedtime for  mood/sleep   Disposition: Patient does not meet criteria for psychiatric inpatient admission. Supportive therapy provided about ongoing stressors. Psychiatric service signing out. Re-consult as needed   Corena Pilgrim, MD 07/24/2020 3:16 PM

## 2020-07-24 NOTE — Progress Notes (Signed)
PROGRESS NOTE   Patrick Phillips  KZS:[id number] DOB: 09-08-1940 DOA: 07/21/2020 PCP: [name], MD  Brief Narrative:   80 year old male resident [facility] 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 (prior MMSE 04/18/2020 at neurology office = 12/30) On dysphagia 3 diet at baseline DM TY 2 HTN Recent admission 4/14 through 07/14/2020-at that time was COVID-positive not hypoxic received remdesivir X2-work-up was done including work-up for meningitis and this was negative-palliative care psychiatry and neurology were involved in his care and he was discharged on Seroquel and Depakote to [facility] memory care unit  Returns to emergency room 5/26 with reported blood in stool-patient became quite agitated Patient received multiple doses of antipsychotics and anxiolytics including Geodon-CT scan eventually was performed showing?  Colitis hydronephrosis  Patient was admitted with presumed colitis Psychiatry was consulted for assistance with behavioral management  Hospital-Problem based course  ?  Colitis felt presenton admit No Colitis  Stop Augmentin  vascular dementia prior MMSE 12/30 in 03/2020 Patient not really redirectable Appreciate psychiatry input==stop depakote-start lexapro 5, seroquel 50 hs Prn ativan ok if not redirectable/aggressive  have placed closer to front of unit Acute urinary retention 5/26 Probably contributing to patient's delirium superimposed on dementia logn discussion with family multiple days re: need for foley and need for OP Urology follow-up P afib in the past  DVT prophylaxis: scd Code Status: Full Family Communication: called Vladimir Creeks [phone] 5/26 and updated fully about goals of care on 5/27 Discussed at bedside with daughter [facility] 5/28 Disposition:  Status is: Observation  The patient will require care spanning > 2 midnights and should be moved to inpatient because: Hemodynamically unstable, Persistent  severe electrolyte disturbances, Altered mental status, Unsafe d/c plan and IV treatments appropriate due to intensity of illness or inability to take PO  Dispo: The patient is from: Home              Anticipated d/c is to: ALF              Patient currently is not medically stable to d/c.   Difficult to place patient No  Consultants:   Pscyhiatry  Procedures:   Antimicrobials:  Changed to Augmentin    Subjective:  Disoriented Redirectable   Objective: Vitals:   07/23/20 2300 07/24/20 0211 07/24/20 0509 07/24/20 0854  BP: (!) 146/58  140/80 (!) 154/64  Pulse: (!) 58  (!) 56 68  Resp: 16  16 20   Temp: 97.8 F (36.6 C)  98 F (36.7 C) 98.1 F (36.7 C)  TempSrc: Axillary  Oral Oral  SpO2: 97%  93%   Weight:  77.1 kg      Intake/Output Summary (Last 24 hours) at 07/24/2020 1454 Last data filed at 07/24/2020 0513 Gross per 24 hour  Intake 2290.9 ml  Output 2450 ml  Net -159.1 ml   Filed Weights   07/22/20 0400 07/23/20 0247 07/24/20 0211  Weight: 78.9 kg 76.7 kg 77.1 kg    Examination:  Cannot obtain ROS Confused Chest clear no added sound Patient is stark naked in the bed Foley catheter is in place he has mittens on  Data Reviewed: personally reviewed   BUN/creatinine 16/0.9-->24/1.0-->11/0.8 AST/ALT 62/52 Potassium 3.7 Wbc 8.9   CT abdomen pelvis IMPRESSION: 1. Possible sigmoid colitis with mild wall thickening and edema. 2. 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 3. Distended urinary bladder with mild wall thickening. Prominence of both renal collecting systems is felt to be due  to degree of bladder distension. 4. 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  Radiology Studies: No results found.   Scheduled Meds: . amoxicillin-clavulanate  1 tablet Oral Q12H  . aspirin EC  81 mg Oral Daily  . carvedilol  3.125 mg Oral BID WC  . Chlorhexidine Gluconate Cloth  6 each Topical Daily  .  feeding supplement  237 mL Oral TID BM  . fluticasone  2 spray Each Nare Daily  . multivitamin with minerals  1 tablet Oral Daily  . potassium chloride  40 mEq Oral Daily  . QUEtiapine  50 mg Oral Q2000  . tamsulosin  0.4 mg Oral Daily   Continuous Infusions: . sodium chloride 50 mL/hr at 07/24/20 0255  . valproate sodium 250 mg (07/24/20 1000)     LOS: 2 days   Time spent: [place], MD [facility] To contact the attending provider between 7A-7P or the covering provider during after hours 7P-7A, please log into the web site [email or web address] and access using universal [place] password for that web site. If you do not have the password, please call the hospital operator.  07/24/2020, 2:54 PM

## 2020-07-24 NOTE — Progress Notes (Addendum)
Patient became very agitated and combative. Nurse gave Ativan 0.5mg  and Lexapro per order.   1800 Patient is calm and asleep.

## 2020-07-25 DIAGNOSIS — F0151 Vascular dementia with behavioral disturbance: Secondary | ICD-10-CM | POA: Diagnosis not present

## 2020-07-25 LAB — CBC WITH DIFFERENTIAL/PLATELET
Abs Immature Granulocytes: 0.03 10*3/uL (ref 0.00–0.07)
Basophils Absolute: 0 10*3/uL (ref 0.0–0.1)
Basophils Relative: 0 %
Eosinophils Absolute: 0.3 10*3/uL (ref 0.0–0.5)
Eosinophils Relative: 3 %
HCT: 35.9 % — ABNORMAL LOW (ref 39.0–52.0)
Hemoglobin: 11.8 g/dL — ABNORMAL LOW (ref 13.0–17.0)
Immature Granulocytes: 0 %
Lymphocytes Relative: 20 %
Lymphs Abs: 2.3 10*3/uL (ref 0.7–4.0)
MCH: 28.9 pg (ref 26.0–34.0)
MCHC: 32.9 g/dL (ref 30.0–36.0)
MCV: 88 fL (ref 80.0–100.0)
Monocytes Absolute: 0.7 10*3/uL (ref 0.1–1.0)
Monocytes Relative: 6 %
Neutro Abs: 8.1 10*3/uL — ABNORMAL HIGH (ref 1.7–7.7)
Neutrophils Relative %: 71 %
Platelets: 228 10*3/uL (ref 150–400)
RBC: 4.08 MIL/uL — ABNORMAL LOW (ref 4.22–5.81)
RDW: 14.2 % (ref 11.5–15.5)
WBC: 11.4 10*3/uL — ABNORMAL HIGH (ref 4.0–10.5)
nRBC: 0 % (ref 0.0–0.2)

## 2020-07-25 MED ORDER — HYDRALAZINE HCL 20 MG/ML IJ SOLN
10.0000 mg | INTRAMUSCULAR | Status: DC | PRN
  Administered 2020-07-26 (×3): 10 mg via INTRAVENOUS
  Filled 2020-07-25 (×3): qty 1

## 2020-07-25 NOTE — Progress Notes (Signed)
PROGRESS NOTE   Patrick Phillips  BJY:[id number] DOB: [birth year] DOA: 07/21/2020 PCP: [name], MD  Brief Narrative:   80 year old male resident [facility] 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 (prior MMSE 04/18/2020 at neurology office = 12/30) On dysphagia 3 diet at baseline DM TY 2 HTN Recent admission 4/14 through 07/14/2020-at that time was COVID-positive not hypoxic received remdesivir X2-work-up was done including work-up for meningitis and this was negative-palliative care psychiatry and neurology were involved in his care and he was discharged on Seroquel and Depakote to [facility] care unit  Returns to emergency room 5/26 with reported blood in stool-patient became quite agitated Patient received multiple doses of antipsychotics and anxiolytics including Geodon-CT scan eventually was performed showing?  Colitis hydronephrosis  Patient was admitted with presumed colitis Psychiatry was consulted for assistance with behavioral management  Hospital-Problem based course  ?  Colitis felt presenton admit No Colitis  Stop Augmentin  vascular dementia prior MMSE 12/30 in 03/2020 More redirectable Appreciate psychiatry input==stop depakote-start lexapro 10, seroquel 50 hs Prn ativan ok if not redirectable/aggressive  have placed closer to front of unit Will discuss palliative care with family more in next 1-2 days.  Likely patient will not recover his Prior LOF and need to discuss advanced care plan.  Family is contemplating this Acute urinary retention 5/26 logn discussion with family multiple days re: need for foley and need for OP Urology follow-up P afib in the past  DVT prophylaxis: scd Code Status: Full Family Communication: discussed at bedside with Vladimir Creeks [phone] on 5/29 Disposition:  Status is: Observation  The patient will require care spanning > 2 midnights and should be moved to inpatient because:  Hemodynamically unstable, Persistent severe electrolyte disturbances, Altered mental status, Unsafe d/c plan and IV treatments appropriate due to intensity of illness or inability to take PO  Dispo: The patient is from: Home              Anticipated d/c is to: ALF              Patient currently is not medically stable to d/c.   Difficult to place patient No  Consultants:   Pscyhiatry  Procedures:   Antimicrobials:  Changed to Augmentin    Subjective:  More coherent , still confused   Objective: Vitals:   07/25/20 0750 07/25/20 0803 07/25/20 0903 07/25/20 1144  BP:  (!) 180/77 (!) 153/59 (!) 162/73  Pulse: 84 66 61 60  Resp: 18 16  16   Temp:  97.9 F (36.6 C)  (!) 97.5 F (36.4 C)  TempSrc:  Oral  Oral  SpO2:  92%  95%  Weight:        Intake/Output Summary (Last 24 hours) at 07/25/2020 1546 Last data filed at 07/25/2020 1324 Gross per 24 hour  Intake 990 ml  Output 2225 ml  Net -1235 ml   Filed Weights   07/23/20 0247 07/24/20 0211 07/25/20 0400  Weight: 76.7 kg 77.1 kg 75.9 kg    Examination:  Awake sitting up Foley in place No distress Confused cta b Foley in place  Data Reviewed: personally reviewed   Wbc 8.9--11.4 Radiology Studies: No results found.   Scheduled Meds: . aspirin EC  81 mg Oral Daily  . carvedilol  3.125 mg Oral BID WC  . Chlorhexidine Gluconate Cloth  6 each Topical Daily  . escitalopram  10 mg Oral Daily  . feeding supplement  237 mL Oral TID BM  .  fluticasone  2 spray Each Nare Daily  . multivitamin with minerals  1 tablet Oral Daily  . potassium chloride  40 mEq Oral Daily  . QUEtiapine  50 mg Oral Q2000  . tamsulosin  0.4 mg Oral Daily   Continuous Infusions:    LOS: 3 days   Time spent: 84  Nita Sells, MD [facility] To contact the attending provider between 7A-7P or the covering provider during after hours 7P-7A, please log into the web site [email or web address] and access using universal [place]  password for that web site. If you do not have the password, please call the hospital operator.  07/25/2020, 3:46 PM

## 2020-07-25 NOTE — Plan of Care (Signed)
  Problem: Pain Managment: Goal: General experience of comfort will improve Outcome: Completed/Met

## 2020-07-26 DIAGNOSIS — F0151 Vascular dementia with behavioral disturbance: Secondary | ICD-10-CM | POA: Diagnosis not present

## 2020-07-26 LAB — CBC WITH DIFFERENTIAL/PLATELET
Abs Immature Granulocytes: 0.02 10*3/uL (ref 0.00–0.07)
Basophils Absolute: 0 10*3/uL (ref 0.0–0.1)
Basophils Relative: 0 %
Eosinophils Absolute: 0.3 10*3/uL (ref 0.0–0.5)
Eosinophils Relative: 3 %
HCT: 35.3 % — ABNORMAL LOW (ref 39.0–52.0)
Hemoglobin: 11.5 g/dL — ABNORMAL LOW (ref 13.0–17.0)
Immature Granulocytes: 0 %
Lymphocytes Relative: 28 %
Lymphs Abs: 2.5 10*3/uL (ref 0.7–4.0)
MCH: 28.8 pg (ref 26.0–34.0)
MCHC: 32.6 g/dL (ref 30.0–36.0)
MCV: 88.5 fL (ref 80.0–100.0)
Monocytes Absolute: 0.5 10*3/uL (ref 0.1–1.0)
Monocytes Relative: 6 %
Neutro Abs: 5.6 10*3/uL (ref 1.7–7.7)
Neutrophils Relative %: 63 %
Platelets: 218 10*3/uL (ref 150–400)
RBC: 3.99 MIL/uL — ABNORMAL LOW (ref 4.22–5.81)
RDW: 14.4 % (ref 11.5–15.5)
WBC: 8.9 10*3/uL (ref 4.0–10.5)
nRBC: 0 % (ref 0.0–0.2)

## 2020-07-26 MED ORDER — ESCITALOPRAM OXALATE 10 MG PO TABS: 10.0000 mg | ORAL_TABLET | Freq: Every day | ORAL | 0 refills | Status: AC

## 2020-07-26 NOTE — TOC Progression Note (Signed)
Transition of Care [facility]) - Progression Note    Patient Details  Name: Patrick Phillips MRN: [id number] Date of Birth: 08-Feb-1941  [facility] CM/SW Contact  Reece Agar, [place] Phone Number: 07/26/2020, 1:51 PM  Clinical Narrative:    3128: CSW spoke with [facility] about pt DC, he is now medically stable. [facility] may be able to DC tomorrow bc the daughter came in Saturday and took all pt belongings not knowing [facility] was able to take pt back. CSW will follow up for DC tomorrow, the facility and family are now all aware that pt will return.        Expected Discharge Plan and Services                                                 Social Determinants of Health (SDOH) Interventions    Readmission Risk Interventions No flowsheet data found.

## 2020-07-26 NOTE — Progress Notes (Signed)
See separate charting for planned discharge on 5/31  Please see summary I have reviewed the patient this morning he is calm and comfortable and is in no distress He has been pulling at times at his Foley catheter he is eating well He seems calmer than he has been and does well when he is able to ambulate out of bed with supervision  See separate notes from transition care management  Verneita Griffes, MD [facility] 2:09 PM

## 2020-07-26 NOTE — Progress Notes (Signed)
Cannot d/c to ILF with Foley per Thedore Mins, Supervisor TOC--d/w Dr. Jeffie Pollock who recommends OP follow up [Retention is acute--needs ~ 2 weeks for bladder to recover and voiding trial]--additionally Patients urologist connected to [place] [dr jon hudson] Will need SNF on d/c  Verneita Griffes, MD [facility] 5:50 PM

## 2020-07-26 NOTE — Plan of Care (Signed)
  Problem: Clinical Measurements: Goal: Ability to maintain clinical measurements within normal limits will improve Outcome: Progressing Goal: Will remain free from infection Outcome: Progressing Goal: Diagnostic test results will improve Outcome: Progressing Goal: Respiratory complications will improve Outcome: Progressing Goal: Cardiovascular complication will be avoided Outcome: Progressing   Problem: Activity: Goal: Risk for activity intolerance will decrease Outcome: Progressing   Problem: Nutrition: Goal: Adequate nutrition will be maintained Outcome: Progressing   Problem: Coping: Goal: Level of anxiety will decrease Outcome: Progressing   Problem: Elimination: Goal: Will not experience complications related to bowel motility Outcome: Progressing Goal: Will not experience complications related to urinary retention Outcome: Not Progressing   Problem: Safety: Goal: Ability to remain free from injury will improve Outcome: Progressing   Problem: Skin Integrity: Goal: Risk for impaired skin integrity will decrease Outcome: Progressing  Patient exhibiting increased confusion and is unable to comprehend instructions.  Can follow a one to two step command, but is easily confused.  Can become restless and irritable, but is not out of control.  Requires prn Haldol until Seroquel level has normalized.

## 2020-07-26 NOTE — Progress Notes (Signed)
Patient ambulated in room with much cueing.  Tends to bend while walking and needs frequent reminders to stand up.  Ambulated approximately 20 feet in total before declaring his desire to sit and eat.  Requires assistance to eat.

## 2020-07-26 NOTE — Progress Notes (Signed)
Attempted to trial patient out of mitts while offering distractions.  This was unsuccessful.  While he did not attempt to remove PIVs as they were wrapped with Kerlex, he did attempt to pull out foley catheter and could not be re-directed.  Haldol given IVP x 1 and mitts re-applied.  Sitter remains in close contact.  Site of foley insertion has sanguinous drainage due to constant manipulation by patient.

## 2020-07-26 NOTE — Discharge Summary (Signed)
Physician Discharge Summary  Jamieson Hetland YME:[id number] DOB: 1941/02/06 DOA: 07/21/2020  PCP: [name], MD  Admit date: 07/21/2020 Discharge date: 07/26/2020  Time spent: 26 minutes  Recommendations for Outpatient Follow-up:  1. Recommend goals of care in the outpatient setting given advanced dementia 2. Would also recommend periodic labs in the outpatient setting 3. Will need chronic indwelling Foley on discharge given tendency to retention and risks and benefits discussed in detail with family with regards to intermittent catheterization versus chronic indwelling catheter-if goals are still aligned with full scope care-please refer the patient to urology in the outpatient setting  Discharge Diagnoses:  MAIN problem for hospitalization   Delirium  Please see below for itemized issues addressed in [place]- refer to other progress notes for clarity if needed  Discharge Condition: Guarded  Diet recommendation: Would advocate for comfort feeds  Filed Weights   07/24/20 0211 07/25/20 0400 07/26/20 0445  Weight: 77.1 kg 75.9 kg 74.8 kg    History of present illness:  80 year old male resident [facility] 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 (prior MMSE 04/18/2020 at neurology office = 12/30) On dysphagia 3 diet at baseline DM TY 2 HTN Recent admission 4/14 through 07/14/2020-at that time was COVID-positive not hypoxic received remdesivir X2-work-up was done including work-up for meningitis and this was negative  -palliative care psychiatry and neurology were involved in his care and he was discharged on Seroquel and Depakote to [facility] care unit  Returns to emergency room 5/26 with reported blood in stool-patient became quite agitated Patient received multiple doses of antipsychotics and anxiolytics including Geodon-CT scan eventually was performed showing?  Colitis hydronephrosis  Patient was admitted with presumed  colitis Psychiatry was consulted for assistance with behavioral management  Hospital Course:  No colitis CT scan over called colitis Patient was briefly on antibiotics which were discontinued No reports of diarrhea or other issues Vascular dementia prior MMSE 12 Psychiatry consulted multiple meds adjusted Can discharge on Lexapro 10 Seroquel at bedtime Recommend outpatient palliative and hospice discussion with the family LUTS, BPH Multiple discussions with multiple family members and risks and benefits of indwelling Foley discussed Foley catheter to continue on discharge Outpatient urology follow-up if felt still full scope of care History of paroxysmal A. fib Has been predominantly in sinus rhythm Monitor in the outpatient setting  Discharge Exam: Vitals:   07/26/20 0829 07/26/20 1122  BP: (!) 158/64 (!) 140/58  Pulse: 63 61  Resp: 16 18  Temp: 98 F (36.7 C) 98.2 F (36.8 C)  SpO2: 98% 96%    Subj on day of d/c   Calm although at times can be tangential He has Foley catheter I cannot get a review of systems  General Exam on discharge  EOMI NCAT no focal deficit Chest clear S1-S2 no murmur Abdomen soft Neurologically intact to power Psych very quite confused at times  Discharge Instructions   Discharge Instructions    Diet - low sodium heart healthy   Complete by: As directed    Increase activity slowly   Complete by: As directed    No wound care   Complete by: As directed      Allergies as of 07/26/2020      Reactions   Aricept [donepezil]    Per daughter, increased mood swings and angry      Medication List    STOP taking these medications   amLODipine 5 MG tablet Commonly known as: NORVASC   cyanocobalamin  1000 MCG tablet   divalproex 125 MG capsule Commonly known as: DEPAKOTE SPRINKLE   loperamide 2 MG capsule Commonly known as: IMODIUM   LORazepam 0.5 MG tablet Commonly known as: Ativan   magnesium hydroxide 400 MG/5ML  suspension Commonly known as: MILK OF MAGNESIA   polyethylene glycol 17 g packet Commonly known as: MIRALAX / GLYCOLAX   senna 8.6 MG Tabs tablet Commonly known as: SENOKOT     TAKE these medications   acetaminophen 500 MG tablet Commonly known as: TYLENOL Take 500 mg by mouth every 6 (six) hours as needed for headache, fever or mild pain.   alum & mag hydroxide-simeth 200-200-20 MG/5ML suspension Commonly known as: MAALOX/MYLANTA Take 30 mLs by mouth every 6 (six) hours as needed for indigestion or heartburn.   aspirin EC 81 MG tablet Take 1 tablet (81 mg total) by mouth daily.   atorvastatin 40 MG tablet Commonly known as: LIPITOR Take 1 tablet (40 mg total) by mouth daily. What changed: when to take this   carvedilol 6.25 MG tablet Commonly known as: COREG Take 1 tablet (6.25 mg total) by mouth 2 (two) times daily with a meal.   escitalopram 10 MG tablet Commonly known as: LEXAPRO Take 1 tablet (10 mg total) by mouth daily. Start taking on: Jul 27, 2020   esomeprazole 40 MG capsule Commonly known as: NEXIUM Take 1 capsule (40 mg total) by mouth daily.   feeding supplement Liqd Take 237 mLs by mouth 2 (two) times daily between meals.   fluticasone 50 MCG/ACT nasal spray Commonly known as: FLONASE Place 2 sprays into both nostrils daily.   guaifenesin 100 MG/5ML syrup Commonly known as: ROBITUSSIN Take 200 mg by mouth every 6 (six) hours as needed for cough.   hydrALAZINE 100 MG tablet Commonly known as: APRESOLINE Take 1 tablet (100 mg total) by mouth every 8 (eight) hours.   metFORMIN 1000 MG tablet Commonly known as: GLUCOPHAGE Take 1 tablet (1,000 mg total) by mouth 2 (two) times daily.   multivitamin with minerals Tabs tablet Take 1 tablet by mouth daily.   neomycin-bacitracin-polymyxin [phone] ointment Apply 1 application topically daily as needed (minor skin tears/abrasions).   QUEtiapine 50 MG tablet Commonly known as: SEROQUEL Take 1  tablet (50 mg total) by mouth daily at 8 pm.   tamsulosin 0.4 MG Caps capsule Commonly known as: FLOMAX Take 1 capsule (0.4 mg total) by mouth daily.   thiamine 100 MG tablet Take 1 tablet (100 mg total) by mouth daily.      Allergies  Allergen Reactions  . Aricept [Donepezil]     Per daughter, increased mood swings and angry      The results of significant diagnostics from this hospitalization (including imaging, microbiology, ancillary and laboratory) are listed below for reference.    Significant Diagnostic Studies: CT ABDOMEN PELVIS WO CONTRAST  Result Date: 07/21/2020 CLINICAL DATA:  Abdominal distension.  Blood in stool. EXAM: CT ABDOMEN AND PELVIS WITHOUT CONTRAST TECHNIQUE: Multidetector CT imaging of the abdomen and pelvis was performed following the standard protocol without IV contrast. COMPARISON:  CT 10/30/2017 FINDINGS: Lower chest: Small right lower lobe pulmonary nodules are unchanged from prior exam, benign. No acute airspace disease or pleural effusion. Stable heart size with coronary artery calcifications. Hepatobiliary: No focal liver abnormality is seen. No gallstones, gallbladder wall thickening, or biliary dilatation. Pancreas: No ductal dilatation or inflammation. Spleen: Normal in size without focal abnormality. Adrenals/Urinary Tract: Normal adrenal glands. Prominence of both renal collecting systems  is felt to be due to degree of bladder distension. No evidence of renal or ureteral calculi. Bladder is distended with volume of 13.2 x 9.5 x 16 cm (volume = 1100 cm^3). Minimal wall thickening about the bladder dome. Possible bladder dome diverticulum. Cysts arising from the mid lower right kidney. Small hyperdense lesion in the upper left kidney is incompletely characterized. Stomach/Bowel: Small hiatal hernia. No small bowel obstruction or inflammation. Small volume of formed stool in the ascending and proximal transverse colon. More distal colon is decompressed which  limits assessment for wall thickening. There may be mild sigmoid wall thickening and edema. Appendix not well visualized on the current exam, no appendicitis. Vascular/Lymphatic: Aortic atherosclerosis without aneurysm. Prominent retroperitoneal lymph nodes are similar to prior exam. Prominent central mesenteric nodes measuring up to 15 mm, series 3, image 36, also similar to prior exam. Mild chronic misty mesentery in the left upper quadrant. Reproductive: Enlarged prostate gland causing mass effect on the bladder base. Prostate spans 5.3 cm transverse. Other: No free air or ascites. Previous left inguinal hernia is no longer seen. Musculoskeletal: Degenerative change throughout the lumbar spine. There are no acute or suspicious osseous abnormalities. IMPRESSION: 1. Possible sigmoid colitis with mild wall thickening and edema. 2. 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 3. Distended urinary bladder with mild wall thickening. Prominence of both renal collecting systems is felt to be due to degree of bladder distension. 4. 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 Aortic Atherosclerosis (ICD10-I70.0). Electronically Signed   By: Keith Rake M.D.   On: 07/21/2020 22:47   MR BRAIN WO CONTRAST  Result Date: 06/29/2020 CLINICAL DATA:  Right-sided facial drooping, altered mental status. EXAM: MRI HEAD WITHOUT CONTRAST TECHNIQUE: Multiplanar, multiecho pulse sequences of the brain and surrounding structures were obtained without intravenous contrast. COMPARISON:  June 10, 2020. FINDINGS: Brain: No acute infarction, hemorrhage, hydrocephalus, extra-axial collection or mass lesion. Dilated perivascular space in inferior right basal ganglia. No substantial white matter disease for patient age. Vascular: Partial loss of flow void in the distal left vertebral artery, which could be due to stenosis. Normal flow void in the visualized proximal aICAs. Skull and upper  cervical spine: Normal marrow signal. Sinuses/Orbits: Mild mucosal thickening of the frontal sinuses and moderate mucosal thickening of scattered ethmoid air cells. Inferior left maxillary sinus mucous retention cyst. Bilateral exopthalmos, similar to prior. Other: No mastoid effusions. IMPRESSION: 1. No evidence of acute abnormality. 2. Partial loss of flow void in the distal left vertebral artery, similar to prior and possibly due to stenosis. Consider CT angio head and neck to further evaluate. 3. Frontoethmoidal paranasal sinus mucosal thickening. Electronically Signed   By: Margaretha Sheffield MD   On: 06/29/2020 07:47   Portable chest 1 View  Result Date: 07/22/2020 CLINICAL DATA:  Shortness of breath and COVID-19 EXAM: PORTABLE CHEST 1 VIEW COMPARISON:  Chest x-ray 07/04/2020, CT chest 05/01/2018 FINDINGS: The heart size and mediastinal contours are unchanged. Aortic calcifications. Wireless cardiac device overlies the left chest. No focal consolidation. No pulmonary edema. No pleural effusion. No pneumothorax. No acute osseous abnormality. IMPRESSION: No active disease. Electronically Signed   By: Iven Finn M.D.   On: 07/22/2020 02:48   DG CHEST PORT 1 VIEW  Result Date: 07/04/2020 CLINICAL DATA:  Dyspnea EXAM: PORTABLE CHEST 1 VIEW COMPARISON:  06/25/2020 FINDINGS: Cardiac shadow is within normal limits. The lungs are clear bilaterally. Loop recorder is noted on the left. No bony abnormality  is seen. IMPRESSION: No acute abnormality noted. Electronically Signed   By: Inez Catalina M.D.   On: 07/04/2020 21:41   DG Swallowing Func-Speech Pathology  Result Date: 07/02/2020 Objective Swallowing Evaluation: Type of Study: Bedside Swallow Evaluation  Patient Details Name: Tamarcus Condie MRN: [id number] Date of Birth: 11/20/1940 Today's Date: 07/02/2020 Time: SLP Start Time (ACUTE ONLY): 40 -SLP Stop Time (ACUTE ONLY): 1249 SLP Time Calculation (min) (ACUTE ONLY): 19 min Past Medical History: Past Medical  History: Diagnosis Date . Cancer ([facility])   skin cancer . Dementia ([place])  . Diabetes mellitus without complication ([place])  . Hypertension  . Memory deficit  Past Surgical History: Past Surgical History: Procedure Laterality Date . BACK SURGERY   . HERNIA REPAIR   . LOOP RECORDER INSERTION N/A 07/26/2017  Procedure: LOOP RECORDER INSERTION;  Surgeon: Evans Lance, MD;  Location: [facility];  Service: Cardiovascular;  Laterality: N/A; . TEE WITHOUT CARDIOVERSION N/A 07/26/2017  Procedure: TRANSESOPHAGEAL ECHOCARDIOGRAM (TEE);  Surgeon: Fay Records, MD;  Location: [facility] ENDOSCOPY;  Service: Cardiovascular;  Laterality: N/A; . TRIGGER FINGER RELEASE Right 12/02/2015  Procedure: RIGHT THUMB RELEASE TRIGGER FINGER/A-1 PULLEY;  Surgeon: Leanora Cover, MD;  Location: [facility];  Service: Orthopedics;  Laterality: Right; HPI: Pt is a 79 y.o. male with PMHx of vascular dementia, CVA, DM-2, HTN. On 4/14, pt took his dog for a walk and the dog returned home without him. Pt was found lying on the side of road with no memory of what had happened but able to follow commands. Pt was taken to [facility] and d/c home with daughter following negative workup, but pt was brough to [facility] ED due to increased confusion. Pt found to be febrile and COVID+ and admitted for acute metabolic encephalopathy. MRI brain 4/14 negative for acute changes. CXR 4/15 negative for active disease. EEG 4/16: mild to moderate diffuse encephalopathy, nonspecific etiology. No seizures or epileptiform discharges. Psych consulted due to severe delirium.  No data recorded Assessment / Plan / Recommendation CHL IP CLINICAL IMPRESSIONS 07/02/2020 Clinical Impression Pt presented with oropharyngeal dysphagia characterized by impaired bolus control and a pharyngeal delay. He demonstrated premature spillage to the pyriform sinuses, penetration (PAS 5) and aspiration (PAS 7, 8) of thin liquids via cup and straw. Pt was unable to demonstrate compensatory strategies  despite verbal prompts and cues and aspiration persisted despite use of 5cc and 10cc boluses. Laryngeal invasion was improved to PAS 2 with nectar thick liquids which is considered WNL. Aspiration resulted in an ineffective cough on two occasions, but all other instances were silent. Per EMR, pt has required augmentative IV fluids every few days due to poor p.o. intake, and SLP anticipates that use of nectar thick liquids to eliminate aspiration would likely not improve this. It is anticipated that the pt has had intermittent aspiration of thin liquids throughout this 21-day admission. Pt's daughter, [name], was contacted via phone and she reported that the pt had also been inconsistently symptomic of aspiration prior to admission so his aspiration may be chronic issue. Pt is ambulatory, afebrile, and all five of his CXRs have been negative for active disease. Pt's case was discussed with Dr. Waldron Labs including potential options for dysphagia management considering dehydration risk vs pulmonary risk. Considering his tolerance of intermittent aspiration thus far, it was agreed that his current diet will be continued with strict observance of swallowing precautions, consistent pt mobilization, and consistent oral care. Pt's daughter, [name], was educated regarding the pt's performance, concerns  regarding potential for dehydration due to avoidance of liquids, and the potential progression of dysphagia in the setting of dementia. All of her questions were answered and she verbalized agreement with plan of care. SLP Visit Diagnosis Dysphagia, oropharyngeal phase (R13.12) Attention and concentration deficit following -- Frontal lobe and executive function deficit following -- Impact on safety and function Mild aspiration risk;Moderate aspiration risk   CHL IP TREATMENT RECOMMENDATION 07/02/2020 Treatment Recommendations Therapy as outlined in treatment plan below   Prognosis 07/02/2020 Prognosis for Safe Diet Advancement  Fair Barriers to Reach Goals Cognitive deficits Barriers/Prognosis Comment -- CHL IP DIET RECOMMENDATION 07/02/2020 SLP Diet Recommendations Dysphagia 3 (Mech soft) solids;Thin liquid Liquid Administration via Cup;No straw Medication Administration Whole meds with puree Compensations Minimize environmental distractions;Slow rate;Small sips/bites Postural Changes Seated upright at 90 degrees   CHL IP OTHER RECOMMENDATIONS 07/02/2020 Recommended Consults -- Oral Care Recommendations Oral care BID;Staff/trained caregiver to provide oral care Other Recommendations --   CHL IP FOLLOW UP RECOMMENDATIONS 07/02/2020 Follow up Recommendations Skilled Nursing facility   [facility] IP FREQUENCY AND DURATION 07/02/2020 Speech Therapy Frequency (ACUTE ONLY) min 1 x/week Treatment Duration 2 weeks      CHL IP ORAL PHASE 07/02/2020 Oral Phase Impaired Oral - Pudding Teaspoon -- Oral - Pudding Cup -- Oral - Honey Teaspoon -- Oral - Honey Cup -- Oral - Nectar Teaspoon -- Oral - Nectar Cup -- Oral - Nectar Straw Premature spillage;Decreased bolus cohesion Oral - Thin Teaspoon -- Oral - Thin Cup Premature spillage;Decreased bolus cohesion Oral - Thin Straw Premature spillage;Decreased bolus cohesion Oral - Puree Decreased bolus cohesion;Premature spillage Oral - Mech Soft -- Oral - Regular WFL Oral - Multi-Consistency -- Oral - Pill Decreased bolus cohesion;Premature spillage Oral Phase - Comment --  CHL IP PHARYNGEAL PHASE 07/02/2020 Pharyngeal Phase Impaired Pharyngeal- Pudding Teaspoon -- Pharyngeal -- Pharyngeal- Pudding Cup -- Pharyngeal -- Pharyngeal- Honey Teaspoon -- Pharyngeal -- Pharyngeal- Honey Cup -- Pharyngeal -- Pharyngeal- Nectar Teaspoon -- Pharyngeal -- Pharyngeal- Nectar Cup Delayed swallow initiation-vallecula Pharyngeal -- Pharyngeal- Nectar Straw Delayed swallow initiation-vallecula;Delayed swallow initiation-pyriform sinuses;Penetration/Aspiration during swallow Pharyngeal Material enters airway, remains ABOVE vocal cords then  ejected out Pharyngeal- Thin Teaspoon -- Pharyngeal -- Pharyngeal- Thin Cup Delayed swallow initiation-pyriform sinuses;Penetration/Aspiration during swallow;Penetration/Apiration after swallow Pharyngeal Material enters airway, CONTACTS cords and not ejected out;Material enters airway, passes BELOW cords and not ejected out despite cough attempt by patient;Material enters airway, passes BELOW cords without attempt by patient to eject out (silent aspiration) Pharyngeal- Thin Straw Delayed swallow initiation-pyriform sinuses;Penetration/Aspiration during swallow;Penetration/Apiration after swallow Pharyngeal Material enters airway, CONTACTS cords and not ejected out;Material enters airway, passes BELOW cords without attempt by patient to eject out (silent aspiration);Material enters airway, passes BELOW cords and not ejected out despite cough attempt by patient Pharyngeal- Puree Delayed swallow initiation-vallecula Pharyngeal -- Pharyngeal- Mechanical Soft -- Pharyngeal -- Pharyngeal- Regular Delayed swallow initiation-vallecula Pharyngeal -- Pharyngeal- Multi-consistency -- Pharyngeal -- Pharyngeal- Pill Delayed swallow initiation-vallecula Pharyngeal -- Pharyngeal Comment --  CHL IP CERVICAL ESOPHAGEAL PHASE 07/02/2020 Cervical Esophageal Phase WFL Pudding Teaspoon -- Pudding Cup -- Honey Teaspoon -- Honey Cup -- Nectar Teaspoon -- Nectar Cup -- Nectar Straw -- Thin Teaspoon -- Thin Cup -- Thin Straw -- Puree -- Mechanical Soft -- Regular -- Multi-consistency -- Pill -- Cervical Esophageal Comment -- Shanika I. Hardin Negus, [place], [place] Office number [phone] Pager [phone] Horton Marshall 07/02/2020, 3:23 PM              EEG adult  Result Date: 06/29/2020 Lora Havens, MD     06/29/2020  1:23 PM Patient Name: Costantino Kohlbeck MRN: [id number] Epilepsy Attending: Lora Havens Referring Physician/Provider: Dr Amie [place] Date: 06/29/2020 Duration: 21.43 mins Patient history:  80 year old male with waxing and waning mental status.  EEG to evaluate for seizures. Level of alertness: Awake AEDs during EEG study: Depakote Technical aspects: This EEG study was done with scalp electrodes positioned according to the 10-20 International system of electrode placement. Electrical activity was acquired at a sampling rate of 500Hz  and reviewed with a high frequency filter of 70Hz  and a low frequency filter of 1Hz . EEG data were recorded continuously and digitally stored. Description: The posterior dominant rhythm consists of 8-9 Hz activity of moderate voltage (25-35 uV) seen predominantly in posterior head regions, symmetric and reactive to eye opening and eye closing. EEG showed intermittent generalized 5 to 6 Hz theta slowing. Hyperventilation and photic stimulation were not performed.   ABNORMALITY - Intermittent slow, generalized IMPRESSION: This study is suggestive of mild diffuse encephalopathy, nonspecific etiology. No seizures or epileptiform discharges were seen throughout the recording. Priyanka Barbra Sarks   CT HEAD CODE STROKE WO CONTRAST`  Result Date: 06/28/2020 CLINICAL DATA:  Code stroke.  Confusion.  Altered mental status. EXAM: CT HEAD WITHOUT CONTRAST TECHNIQUE: Contiguous axial images were obtained from the base of the skull through the vertex without intravenous contrast. COMPARISON:  MRI 06/10/2020 FINDINGS: Brain: Age related volume loss. No evidence of old or acute small or large vessel infarction. No mass lesion, hemorrhage, hydrocephalus or extra-axial collection. Dilated perivascular space at the base of the brain. Vascular: There is atherosclerotic calcification of the major vessels at the base of the brain. Skull: Negative Sinuses/Orbits: Clear/normal Other: None ASPECTS ([place] Stroke Program Early CT Score) - Ganglionic level infarction (caudate, lentiform nuclei, internal capsule, insula, M1-M3 cortex): 7 - Supraganglionic infarction (M4-M6 cortex): 3 Total score  (0-10 with 10 being normal): 10 IMPRESSION: 1. Normal head CT for age. Atherosclerotic calcification of the major vessels at base of the brain. 2. ASPECTS is 10 3. These results were communicated to Dr. Rory Percy at 7:18 pmon 5/2/2022by text page via the [facility] messaging system. Electronically Signed   By: Nelson Chimes M.D.   On: 06/28/2020 19:19    Microbiology: Recent Results (from the past 240 hour(s))  Resp Panel by RT-PCR (Flu A&B, Covid) Nasopharyngeal Swab     Status: Abnormal   Collection Time: 07/21/20 11:48 PM   Specimen: Nasopharyngeal Swab; Nasopharyngeal(NP) swabs in vial transport medium  Result Value Ref Range Status   SARS Coronavirus 2 by RT PCR POSITIVE (A) NEGATIVE Final    Comment: RESULT CALLED TO, READ BACK BY AND VERIFIED WITH: RN GEORGE N. [phone] FCP (NOTE) SARS-CoV-2 target nucleic acids are DETECTED.  The SARS-CoV-2 RNA is generally detectable in upper respiratory specimens during the acute phase of infection. Positive results are indicative of the presence of the identified virus, but do not rule out bacterial infection or co-infection with other pathogens not detected by the test. Clinical correlation with patient history and other diagnostic information is necessary to determine patient infection status. The expected result is Negative.  Fact Sheet for Patients: EntrepreneurPulse.com.au  Fact Sheet for Healthcare Providers: IncredibleEmployment.be  This test is not yet approved or cleared by the [place] FDA and  has been authorized for detection and/or diagnosis of SARS-CoV-2 by FDA under an Emergency Use Authorization (EUA).  This EUA will remain in effect (meaning this test  can be used ) for the duration of  the COVID-19 declaration under Section 564(b)(1) of the Act, 21 U.S.C. section 360bbb-3(b)(1), unless the authorization is terminated or revoked sooner.     Influenza A by PCR NEGATIVE NEGATIVE Final    Influenza B by PCR NEGATIVE NEGATIVE Final    Comment: (NOTE) The Xpert Xpress SARS-CoV-2/FLU/RSV plus assay is intended as an aid in the diagnosis of influenza from Nasopharyngeal swab specimens and should not be used as a sole basis for treatment. Nasal washings and aspirates are unacceptable for Xpert Xpress SARS-CoV-2/FLU/RSV testing.  Fact Sheet for Patients: EntrepreneurPulse.com.au  Fact Sheet for Healthcare Providers: IncredibleEmployment.be  This test is not yet approved or cleared by the [place] FDA and has been authorized for detection and/or diagnosis of SARS-CoV-2 by FDA under an Emergency Use Authorization (EUA). This EUA will remain in effect (meaning this test can be used) for the duration of the COVID-19 declaration under Section 564(b)(1) of the Act, 21 U.S.C. section 360bbb-3(b)(1), unless the authorization is terminated or revoked.  Performed at [facility], [place] [address] [place], [place] [address]      Labs: Basic Metabolic Panel: Recent Labs  Lab 07/21/20 1626 07/23/20 0256 07/24/20 0220  NA 139 140 138  K 4.0 3.3* 3.7  CL 105 107 107  CO2 23 27 26   GLUCOSE 121* 115* 127*  BUN 24* 11 11  CREATININE 1.04 0.98 0.82  CALCIUM 9.2 8.6* 8.8*   Liver Function Tests: Recent Labs  Lab 07/21/20 1626 07/23/20 0256 07/24/20 0220  AST 62* 33 27  ALT 52* 38 38  ALKPHOS 48 34* 37*  BILITOT 0.8 0.7 0.6  PROT 6.0* 4.7* 4.8*  ALBUMIN 3.4* 2.6* 2.6*   No results for input(s): LIPASE, AMYLASE in the last 168 hours. No results for input(s): AMMONIA in the last 168 hours. CBC: Recent Labs  Lab 07/21/20 1626 07/23/20 0256 07/24/20 0220 07/25/20 0612 07/26/20 0205  WBC 10.1 9.0 8.9 11.4* 8.9  NEUTROABS 6.7 5.4 5.1 8.1* 5.6  HGB 11.5* 11.0* 11.6* 11.8* 11.5*  HCT 35.4* 33.4* 35.3* 35.9* 35.3*  MCV 88.7 87.7 87.6 88.0 88.5  PLT 293 208 218 228 218   Cardiac Enzymes: No results for input(s):  CKTOTAL, CKMB, CKMBINDEX, TROPONINI in the last 168 hours. BNP: BNP (last 3 results) Recent Labs    06/27/20 0052 06/28/20 0044 07/22/20 0220  BNP 48.0 39.4 54.2    ProBNP (last 3 results) No results for input(s): PROBNP in the last 8760 hours.  CBG: Recent Labs  Lab 07/22/20 0534 07/22/20 1130 07/22/20 1811 07/24/20 1642  GLUCAP 125* 121* 126* 185*       Signed:  Nita Sells MD   [facility] 07/26/2020, 2:09 PM

## 2020-07-27 LAB — CBC WITH DIFFERENTIAL/PLATELET
Abs Immature Granulocytes: 0.04 10*3/uL (ref 0.00–0.07)
Basophils Absolute: 0 10*3/uL (ref 0.0–0.1)
Basophils Relative: 0 %
Eosinophils Absolute: 0.3 10*3/uL (ref 0.0–0.5)
Eosinophils Relative: 3 %
HCT: 36.5 % — ABNORMAL LOW (ref 39.0–52.0)
Hemoglobin: 12 g/dL — ABNORMAL LOW (ref 13.0–17.0)
Immature Granulocytes: 0 %
Lymphocytes Relative: 24 %
Lymphs Abs: 2.6 10*3/uL (ref 0.7–4.0)
MCH: 28.8 pg (ref 26.0–34.0)
MCHC: 32.9 g/dL (ref 30.0–36.0)
MCV: 87.5 fL (ref 80.0–100.0)
Monocytes Absolute: 0.7 10*3/uL (ref 0.1–1.0)
Monocytes Relative: 7 %
Neutro Abs: 7 10*3/uL (ref 1.7–7.7)
Neutrophils Relative %: 66 %
Platelets: 230 10*3/uL (ref 150–400)
RBC: 4.17 MIL/uL — ABNORMAL LOW (ref 4.22–5.81)
RDW: 14.4 % (ref 11.5–15.5)
WBC: 10.7 10*3/uL — ABNORMAL HIGH (ref 4.0–10.5)
nRBC: 0 % (ref 0.0–0.2)

## 2020-07-27 LAB — GLUCOSE, CAPILLARY: Glucose-Capillary: 305 mg/dL — ABNORMAL HIGH (ref 70–99)

## 2020-07-27 MED ORDER — INSULIN ASPART 100 UNIT/ML IJ SOLN
0.0000 [IU] | Freq: Three times a day (TID) | INTRAMUSCULAR | Status: DC
  Administered 2020-07-27: 11 [IU] via SUBCUTANEOUS
  Administered 2020-07-28: 8 [IU] via SUBCUTANEOUS
  Administered 2020-07-28: 2 [IU] via SUBCUTANEOUS
  Administered 2020-07-28 (×2): 3 [IU] via SUBCUTANEOUS
  Administered 2020-07-29: 5 [IU] via SUBCUTANEOUS
  Administered 2020-07-29: 3 [IU] via SUBCUTANEOUS
  Administered 2020-07-29: 8 [IU] via SUBCUTANEOUS
  Administered 2020-07-30: 3 [IU] via SUBCUTANEOUS

## 2020-07-27 NOTE — TOC Progression Note (Addendum)
Transition of Care [facility]) - Progression Note    Patient Details  Name: Patrick Phillips MRN: [id number] Date of Birth: [birth year]  Transition of Care [facility]) CM/SW Contact  Reece Agar, [place] Phone Number: 07/27/2020, 9:32 AM  Clinical Narrative:    0930: CSW spoke with pt daughter [name] to update her about her father not going back to [facility] bc of the foley. [name] was aware and is agreeable to finding other placement. CSW has faxed pt out and will follow up with each facility for offers.  0941: CSW contacted [place], there was no answer CSW will follow up.  1550: CSW spoke with Lenna Sciara at [facility] she stated they are reviewing pt information and will follow up after decision is made.  1615: CSW spoke with [facility], they suggested [place] since they have memory care unit and that this pt will be hard to place with the foley, suggested in and out foley. CSW will follow back up with [facility] once they have made an offer.        Expected Discharge Plan and Services           Expected Discharge Date: 07/27/20                                     Social Determinants of Health (SDOH) Interventions    Readmission Risk Interventions No flowsheet data found.

## 2020-07-27 NOTE — NC FL2 (Signed)
[place] LEVEL OF CARE SCREENING TOOL     IDENTIFICATION  Patient Name: Patrick Phillips Birthdate: Nov 20, 1940 Sex: male Admission Date (Current Location): 07/21/2020  [facility] and [place] Number:  Herbalist and Address:         Provider Number:    Attending Physician Name and Address:  Nita Sells, MD  Relative Name and Phone Number:       Current Level of Care:   Recommended Level of Care: [place] Prior Approval Number:    Date Approved/Denied:   PASRR Number:    Discharge Plan: SNF    Current Diagnoses: Patient Active Problem List   Diagnosis Date Noted  . 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[place]) 07/24/2020  . Acute colitis 07/21/2020  . Volume depletion 07/21/2020  . Normocytic anemia 07/21/2020  . Malnutrition of moderate degree 06/30/2020  . Acute metabolic encephalopathy 93/57/0177  . COVID-19 virus infection 06/11/2020  . Vascular dementia ([place]) 06/11/2020  . Acute ischemic stroke ([place]) 07/24/2017  . Chest pain 12/22/2015  . Chronic back pain 12/22/2015  . Syncope 10/17/2013  . Sinus bradycardia 10/17/2013  . SOB (shortness of breath) 10/17/2013  . Hypertension   . Type 2 diabetes mellitus (H[facility])     Orientation RESPIRATION BLADDER Height & Weight            Weight: 167 lb 5.3 oz (75.9 kg) Height:     BEHAVIORAL SYMPTOMS/MOOD NEUROLOGICAL BOWEL NUTRITION STATUS           AMBULATORY STATUS COMMUNICATION OF NEEDS Skin                               Personal Care Assistance Level of Assistance              Functional Limitations Info             SPECIAL CARE FACTORS FREQUENCY        PT Frequency: 2x/week OT Frequency: 2x/week            Contractures      Additional Factors Info                  Current Medications (07/27/2020):  This is the current hospital active medication list Current Facility-Administered Medications  Medication Dose Route Frequency  Provider Last Rate Last Admin  . acetaminophen (TYLENOL) tablet 650 mg  650 mg Oral Q6H PRN Reubin Milan, MD       Or  . acetaminophen (TYLENOL) suppository 650 mg  650 mg Rectal Q6H PRN Reubin Milan, MD      . albuterol (PROVENTIL) (2.5 MG/3ML) 0.083% nebulizer solution 2.5 mg  2.5 mg Nebulization Q4H PRN Reubin Milan, MD      . aspirin EC tablet 81 mg  81 mg Oral Daily Reubin Milan, MD   81 mg at 07/26/20 1055  . carvedilol (COREG) tablet 3.125 mg  3.125 mg Oral BID WC Nita Sells, MD   3.125 mg at 07/27/20 0748  . Chlorhexidine Gluconate Cloth 2 % PADS 6 each  6 each Topical Daily Nita Sells, MD   6 each at 07/26/20 1055  . escitalopram (LEXAPRO) tablet 10 mg  10 mg Oral Daily Akintayo, Mojeed, MD   10 mg at 07/26/20 1054  . feeding supplement (ENSURE ENLIVE / ENSURE PLUS) liquid 237 mL  237 mL Oral TID BM Samtani, Jai-Gurmukh, MD   237 mL at  07/26/20 2018  . fluticasone (FLONASE) 50 MCG/ACT nasal spray 2 spray  2 spray Each Nare Daily Reubin Milan, MD   2 spray at 07/26/20 1056  . guaiFENesin-dextromethorphan (ROBITUSSIN DM) 100-10 MG/5ML syrup 10 mL  10 mL Oral Q4H PRN Reubin Milan, MD      . haloperidol lactate (HALDOL) injection 1 mg  1 mg Intravenous Q6H PRN Nita Sells, MD   1 mg at 07/26/20 1725  . hydrALAZINE (APRESOLINE) injection 10 mg  10 mg Intravenous Q4H PRN Rise Patience, MD   10 mg at 07/26/20 2018  . LORazepam (ATIVAN) tablet 0.5 mg  0.5 mg Oral Q8H PRN Reubin Milan, MD   0.5 mg at 07/26/20 0029  . multivitamin with minerals tablet 1 tablet  1 tablet Oral Daily Nita Sells, MD   1 tablet at 07/26/20 1054  . ondansetron (ZOFRAN) injection 4 mg  4 mg Intravenous Q6H PRN Reubin Milan, MD      . potassium chloride SA (KLOR-CON) CR tablet 40 mEq  40 mEq Oral Daily Nita Sells, MD   40 mEq at 07/26/20 1053  . QUEtiapine (SEROQUEL) tablet 50 mg  50 mg Oral Q2000 Akintayo,  Mojeed, MD   50 mg at 07/26/20 2018  . tamsulosin (FLOMAX) capsule 0.4 mg  0.4 mg Oral Daily Reubin Milan, MD   0.4 mg at 07/26/20 1054     Discharge Medications: Please see discharge summary for a list of discharge medications.  Relevant Imaging Results:  Relevant Lab Results:   Additional Information    Samari Gorby Renold Don, LCSWA

## 2020-07-27 NOTE — Progress Notes (Signed)
Patient reviewed and examined today He has been discharged but will need his Foley in place on discharge Social worker has been working diligently to try and ensure that he has a place to go-there have been no major changes since last note  I spoke to the sitter today and she states that he has been stable and redirectable and although he has been pulling at his Foley he has not pulled it out   I have discussed with the charge nurse that the patient usually responds well to frequent walks and this helps to calm him down and have advocated that he ambulate after supper  This is a no charge note Discharge summary will be updated when patient is stable to leave the hospital  Verneita Griffes, MD [facility] 5:44 PM

## 2020-07-28 DIAGNOSIS — I10 Essential (primary) hypertension: Secondary | ICD-10-CM

## 2020-07-28 DIAGNOSIS — K529 Noninfective gastroenteritis and colitis, unspecified: Secondary | ICD-10-CM | POA: Diagnosis not present

## 2020-07-28 DIAGNOSIS — D649 Anemia, unspecified: Secondary | ICD-10-CM | POA: Diagnosis not present

## 2020-07-28 DIAGNOSIS — F0151 Vascular dementia with behavioral disturbance: Secondary | ICD-10-CM | POA: Diagnosis not present

## 2020-07-28 LAB — GLUCOSE, CAPILLARY
Glucose-Capillary: 189 mg/dL — ABNORMAL HIGH (ref 70–99)
Glucose-Capillary: 287 mg/dL — ABNORMAL HIGH (ref 70–99)
Glucose-Capillary: 137 mg/dL — ABNORMAL HIGH (ref 70–99)
Glucose-Capillary: 160 mg/dL — ABNORMAL HIGH (ref 70–99)

## 2020-07-28 NOTE — Care Management Important Message (Signed)
Important Message  Patient Details  Name: Patrick Phillips MRN: [id number] Date of Birth: 1940-05-19   Medicare Important Message Given:  Yes     Shelda Altes 07/28/2020, 8:39 AM

## 2020-07-28 NOTE — TOC Progression Note (Signed)
Transition of Care [facility]) - Progression Note    Patient Details  Name: Patrick Phillips MRN: [id number] Date of Birth: 1940/11/16  Transition of Care [facility]) CM/SW Contact  Reece Agar, [place] Phone Number: 07/28/2020, 5:19 PM  Clinical Narrative:    1640: CSW spoke with Admin at [facility], they are willing to accept pt. CSW started auth/faxed clinicals and will follow up once insurance is back.         Expected Discharge Plan and Services           Expected Discharge Date: 07/27/20                                     Social Determinants of Health (SDOH) Interventions    Readmission Risk Interventions No flowsheet data found.

## 2020-07-28 NOTE — Progress Notes (Signed)
[facility] PROGRESS NOTE    Progress Note  Patrick Phillips  INO:[id number] DOB: November 04, 1940 DOA: 07/21/2020 PCP: [name], MD     Brief Narrative:   Patrick Phillips is an 80 y.o. male who resides in a memory care unit due to vascular advanced dementia diabetes and hypertension recently discharged on 07/14/2020 on Seroquel and Depakote to the memory unit return to the emergency room 07/22/2020 for blood in stool and agitation  Awaiting placement to skilled nursing facility.  Assessment/Plan:   Concern about blood in stool: No infectious colitis no further bloody bowel movement antibiotic stopped.  Vascular dementia: Psychiatry was consulted they recommended start Lexapro and Seroquel. Will need a memory unit.  Acute urinary retention on 07/22/2020: Foley in place urology was consulted started on Flomax we will need to follow-up with urology in 2 weeks to remove Foley, will need a voiding trial.  Paroxysmal atrial fibrillation:  Volume depletion: Was started on IV fluids now resolved.  Essential hypertension: Continue amlodipine Coreg and hydralazine. Blood pressure is fairly controlled.  Disposition: Memory care unit will not take him with a Foley in place he will need his Foley for acute urinary retention, he will need this for at least 2 weeks then he will follow-up with urology as an outpatient and have a voiding trial then.  Awaiting placement to skilled nursing facility.   DVT prophylaxis: lovenox Family Communication:none Status is: Inpatient  Remains inpatient appropriate because:Hemodynamically unstable   Dispo: The patient is from: Group home              Anticipated d/c is to: Memory care unit              Patient currently is medically stable to d/c.   Difficult to place patient Yes        Code Status:     Code Status Orders  (From admission, onward)         Start     Ordered   07/22/20 0140  Full code  Continuous        07/22/20 0141         Code Status History    Date Active Date Inactive Code Status Order ID Comments User Context   06/26/2020 1200 07/15/2020 0324 DNR [id number]  Rosezella Rumpf, NP Inpatient   06/11/2020 0542 06/26/2020 1200 Full Code [id number]  Etta Quill, DO ED   07/24/2017 2013 07/26/2017 2026 Full Code [id number]  Etta Quill, DO ED   10/17/2013 0319 10/17/2013 1343 Full Code [id number]  Phillips Grout, MD Inpatient   Advance Care Planning Activity        IV Access:    Peripheral IV   Procedures and diagnostic studies:   No results found.   Medical Consultants:    None.   Subjective:    [employer] no new complaints moving around the room.  Objective:    Vitals:   07/28/20 0026 07/28/20 0541 07/28/20 0700 07/28/20 0714  BP: (!) 148/92 129/63  132/62  Pulse: 72 62  62  Resp: 18 18  18   Temp: 98.7 F (37.1 C) 98 F (36.7 C) 98 F (36.7 C) 98.7 F (37.1 C)  TempSrc: Oral Oral  Oral  SpO2:  95%  96%  Weight:  73.7 kg     SpO2: 96 % O2 Flow Rate (L/min): 0 L/min   Intake/Output Summary (Last 24 hours) at 07/28/2020 0934 Last data filed at 07/28/2020 0718 Gross per 24 hour  Intake 1400 ml  Output 2775 ml  Net -1375 ml   Filed Weights   07/26/20 0445 07/27/20 0500 07/28/20 0541  Weight: 74.8 kg 75.9 kg 73.7 kg    Exam: General exam: In no acute distress. Respiratory system: Good air movement and clear to auscultation. Cardiovascular system: S1 & S2 heard, RRR. No JVD. Gastrointestinal system: Abdomen is nondistended, soft and nontender.  Extremities: No pedal edema. Skin: No rashes, lesions or ulcers  Data Reviewed:    Labs: Basic Metabolic Panel: Recent Labs  Lab 07/21/20 1626 07/23/20 0256 07/24/20 0220  NA 139 140 138  K 4.0 3.3* 3.7  CL 105 107 107  CO2 23 27 26   GLUCOSE 121* 115* 127*  BUN 24* 11 11  CREATININE 1.04 0.98 0.82  CALCIUM 9.2 8.6* 8.8*   GFR Estimated Creatinine Clearance: 76.1 mL/min (by C-G formula based on SCr  of 0.82 mg/dL). Liver Function Tests: Recent Labs  Lab 07/21/20 1626 07/23/20 0256 07/24/20 0220  AST 62* 33 27  ALT 52* 38 38  ALKPHOS 48 34* 37*  BILITOT 0.8 0.7 0.6  PROT 6.0* 4.7* 4.8*  ALBUMIN 3.4* 2.6* 2.6*   No results for input(s): LIPASE, AMYLASE in the last 168 hours. No results for input(s): AMMONIA in the last 168 hours. Coagulation profile Recent Labs  Lab 07/21/20 1626  INR 1.0   COVID-19 Labs  No results for input(s): DDIMER, FERRITIN, LDH, CRP in the last 72 hours.  Lab Results  Component Value Date   SARSCOV2NAA POSITIVE (A) 07/21/2020   SARSCOV2NAA POSITIVE (A) 06/11/2020   [place] NEGATIVE 01/13/2020    CBC: Recent Labs  Lab 07/23/20 0256 07/24/20 0220 07/25/20 0612 07/26/20 0205 07/27/20 0340  WBC 9.0 8.9 11.4* 8.9 10.7*  NEUTROABS 5.4 5.1 8.1* 5.6 7.0  HGB 11.0* 11.6* 11.8* 11.5* 12.0*  HCT 33.4* 35.3* 35.9* 35.3* 36.5*  MCV 87.7 87.6 88.0 88.5 87.5  PLT 208 218 228 218 230   Cardiac Enzymes: No results for input(s): CKTOTAL, CKMB, CKMBINDEX, TROPONINI in the last 168 hours. BNP (last 3 results) No results for input(s): PROBNP in the last 8760 hours. CBG: Recent Labs  Lab 07/22/20 1130 07/22/20 1811 07/24/20 1642 07/27/20 2002 07/28/20 0540  GLUCAP 121* 126* 185* 305* 189*   D-Dimer: No results for input(s): DDIMER in the last 72 hours. Hgb A1c: No results for input(s): HGBA1C in the last 72 hours. Lipid Profile: No results for input(s): CHOL, HDL, LDLCALC, TRIG, CHOLHDL, LDLDIRECT in the last 72 hours. Thyroid function studies: No results for input(s): TSH, T4TOTAL, T3FREE, THYROIDAB in the last 72 hours.  Invalid input(s): FREET3 Anemia work up: No results for input(s): VITAMINB12, FOLATE, FERRITIN, TIBC, IRON, RETICCTPCT in the last 72 hours. Sepsis Labs: Recent Labs  Lab 07/22/20 0220 07/23/20 0256 07/24/20 0220 07/25/20 0612 07/26/20 0205 07/27/20 0340  PROCALCITON <0.10  --   --   --   --   --   WBC  --     < > 8.9 11.4* 8.9 10.7*   < > = values in this interval not displayed.   Microbiology Recent Results (from the past 240 hour(s))  Resp Panel by RT-PCR (Flu A&B, Covid) Nasopharyngeal Swab     Status: Abnormal   Collection Time: 07/21/20 11:48 PM   Specimen: Nasopharyngeal Swab; Nasopharyngeal(NP) swabs in vial transport medium  Result Value Ref Range Status   SARS Coronavirus 2 by RT PCR POSITIVE (A) NEGATIVE Final    Comment: RESULT CALLED TO, READ BACK  BY AND VERIFIED WITH: RN GEORGE N. [phone] FCP (NOTE) SARS-CoV-2 target nucleic acids are DETECTED.  The SARS-CoV-2 RNA is generally detectable in upper respiratory specimens during the acute phase of infection. Positive results are indicative of the presence of the identified virus, but do not rule out bacterial infection or co-infection with other pathogens not detected by the test. Clinical correlation with patient history and other diagnostic information is necessary to determine patient infection status. The expected result is Negative.  Fact Sheet for Patients: EntrepreneurPulse.com.au  Fact Sheet for Healthcare Providers: IncredibleEmployment.be  This test is not yet approved or cleared by the [place] FDA and  has been authorized for detection and/or diagnosis of SARS-CoV-2 by FDA under an Emergency Use Authorization (EUA).  This EUA will remain in effect (meaning this test can be used ) for the duration of  the COVID-19 declaration under Section 564(b)(1) of the Act, 21 U.S.C. section 360bbb-3(b)(1), unless the authorization is terminated or revoked sooner.     Influenza A by PCR NEGATIVE NEGATIVE Final   Influenza B by PCR NEGATIVE NEGATIVE Final    Comment: (NOTE) The Xpert Xpress SARS-CoV-2/FLU/RSV plus assay is intended as an aid in the diagnosis of influenza from Nasopharyngeal swab specimens and should not be used as a sole basis for treatment. Nasal washings  and aspirates are unacceptable for Xpert Xpress SARS-CoV-2/FLU/RSV testing.  Fact Sheet for Patients: EntrepreneurPulse.com.au  Fact Sheet for Healthcare Providers: IncredibleEmployment.be  This test is not yet approved or cleared by the [place] FDA and has been authorized for detection and/or diagnosis of SARS-CoV-2 by FDA under an Emergency Use Authorization (EUA). This EUA will remain in effect (meaning this test can be used) for the duration of the COVID-19 declaration under Section 564(b)(1) of the Act, 21 U.S.C. section 360bbb-3(b)(1), unless the authorization is terminated or revoked.  Performed at [facility], [place] [address], [place], [place] [address]      Medications:   . aspirin EC  81 mg Oral Daily  . carvedilol  3.125 mg Oral BID WC  . Chlorhexidine Gluconate Cloth  6 each Topical Daily  . escitalopram  10 mg Oral Daily  . feeding supplement  237 mL Oral TID BM  . fluticasone  2 spray Each Nare Daily  . insulin aspart  0-15 Units Subcutaneous TID AC & HS  . multivitamin with minerals  1 tablet Oral Daily  . potassium chloride  40 mEq Oral Daily  . QUEtiapine  50 mg Oral Q2000  . tamsulosin  0.4 mg Oral Daily   Continuous Infusions:    LOS: 6 days   Charlynne Cousins  [facility]  07/28/2020, 9:34 AM

## 2020-07-29 DIAGNOSIS — D649 Anemia, unspecified: Secondary | ICD-10-CM | POA: Diagnosis not present

## 2020-07-29 DIAGNOSIS — K529 Noninfective gastroenteritis and colitis, unspecified: Secondary | ICD-10-CM | POA: Diagnosis not present

## 2020-07-29 DIAGNOSIS — F0151 Vascular dementia with behavioral disturbance: Secondary | ICD-10-CM | POA: Diagnosis not present

## 2020-07-29 DIAGNOSIS — I10 Essential (primary) hypertension: Secondary | ICD-10-CM | POA: Diagnosis not present

## 2020-07-29 LAB — GLUCOSE, CAPILLARY
Glucose-Capillary: 253 mg/dL — ABNORMAL HIGH (ref 70–99)
Glucose-Capillary: 246 mg/dL — ABNORMAL HIGH (ref 70–99)
Glucose-Capillary: 194 mg/dL — ABNORMAL HIGH (ref 70–99)

## 2020-07-29 MED ORDER — MELATONIN 3 MG PO TABS
3.0000 mg | ORAL_TABLET | Freq: Every day | ORAL | Status: DC
  Administered 2020-07-29: 3 mg via ORAL
  Filled 2020-07-29: qty 1

## 2020-07-29 NOTE — Progress Notes (Signed)
Nutrition Follow-up  DOCUMENTATION CODES:   Non-severe (moderate) malnutrition in context of chronic illness  INTERVENTION:   -Continue Ensure Enlive po TID, each supplement provides 350 kcal and 20 grams of protein -Continue Magic cup TID with meals, each supplement provides 290 kcal and 9 grams of protein -Continue MVI with minerals daily -Continue feeding assistance with meals  NUTRITION DIAGNOSIS:   Moderate Malnutrition related to chronic illness (dementia) as evidenced by mild fat depletion,mild muscle depletion.  Ongoing  GOAL:   Patient will meet greater than or equal to 90% of their needs  Progressing   MONITOR:   PO intake,Supplement acceptance,Labs,Weight trends,Skin,I & O's  REASON FOR ASSESSMENT:   Malnutrition Screening Tool    ASSESSMENT:   Patrick Phillips is a 80 y.o. male with medical history significant of unspecified skin cancer, vascular dementia with behavioral disturbance, type II DM, hypertension who is brought to the emergency department due to having blood in stools.  The patient is currently sedated and unable to provide further information.  According to the [place] staff he was having diarrhea episodes for the last 2 days so they sent him to the ED for further evaluation and would like him to be tested for C. difficile before he returns to the facility.  The patient's daughter has stated that the patient has had a distended abdomen for the past month or so.  Reviewed I/O's: -483 ml x 24 hours and -9.5 L since admission  UOP: 1.8 L x 24 hours  Pt somnolent at time of visit. He just received ativan.   Spoke with sitter at bedside. She reports pt consumed about 50% of breakfast with assistance. Noted meal completion 50-100%. Pt also consumed Ensure Enlive supplement.   Per TOC notes, plan to discharge back to SNF once insurance authorization is obtained.   Medications reviewed and include melatonin.  Labs reviewed: CBGS: 137-287 (inpatient  orders for glycemic control are 0-15 units insulin aspart TID before meals and at bedtime).   NUTRITION - FOCUSED PHYSICAL EXAM:  Flowsheet Row Most Recent Value  Orbital Region Mild depletion  Upper Arm Region Mild depletion  Thoracic and Lumbar Region No depletion  Buccal Region Mild depletion  Temple Region Mild depletion  Clavicle Bone Region Mild depletion  Clavicle and Acromion Bone Region Mild depletion  Scapular Bone Region Mild depletion  Dorsal Hand No depletion  Patellar Region Mild depletion  Anterior Thigh Region Mild depletion  Posterior Calf Region Mild depletion  Edema (RD Assessment) None  Hair Reviewed  Eyes Reviewed  Mouth Reviewed  Skin Reviewed  Nails Reviewed       Diet Order:   Diet Order            Diet Carb Modified Fluid consistency: Thin; Room service appropriate? Yes  Diet effective now           Diet - low sodium heart healthy                 EDUCATION NEEDS:   No education needs have been identified at this time  Skin:  Skin Assessment: Skin Integrity Issues: Skin Integrity Issues:: Other (Comment) Other: MASD anus, bil;ateral buttocks, scrotum; non-pressure wound to lt elbow  Last BM:  07/28/20  Height:   Ht Readings from Last 1 Encounters:  06/10/20 6' (1.829 m)    Weight:   Wt Readings from Last 1 Encounters:  07/29/20 73.1 kg    Ideal Body Weight:  80.9 kg  BMI:  Body mass  index is 21.86 kg/m.  Estimated Nutritional Needs:   Kcal:  2300-2500  Protein:  115-130 grams  Fluid:  > 2 L    Loistine Chance, RD, LDN, [place] Registered Dietitian II Certified Diabetes Care and Education Specialist Please refer to [facility] for RD and/or RD on-call/weekend/after hours pager

## 2020-07-29 NOTE — TOC Progression Note (Signed)
Transition of Care [facility]) - Progression Note    Patient Details  Name: Patrick Phillips MRN: [id number] Date of Birth: [birth year]  Transition of Care [facility]) CM/SW Contact  Reece Agar, [place] Phone Number: 07/29/2020, 12:22 PM  Clinical Narrative:    1220: CSW following awaiting auth, sitter was given 24 hour notice.         Expected Discharge Plan and Services           Expected Discharge Date: 07/27/20                                     Social Determinants of Health (SDOH) Interventions    Readmission Risk Interventions No flowsheet data found.

## 2020-07-29 NOTE — Progress Notes (Signed)
[facility] PROGRESS NOTE    Progress Note  Wray Goehring  JHE:[id number] DOB: 02-Feb-1941 DOA: 07/21/2020 PCP: [name], MD     Brief Narrative:   Patrick Patrick is an 80 y.o. male who resides in a memory care unit due to vascular advanced dementia diabetes and hypertension recently discharged on 07/14/2020 on Seroquel and Depakote to the memory unit return to the emergency room 07/22/2020 for blood in stool and agitation  Awaiting placement to skilled nursing facility.  Assessment/Plan:   Concern about blood in stool: No infectious colitis no further bloody bowel movement, antibiotic stopped.  Vascular dementia: Psychiatry was consulted they recommended start Lexapro and Seroquel. Will need a memory unit.  Acute urinary retention on 07/22/2020: Foley in place urology was consulted started on Flomax we will need to follow-up with urology in 2 weeks to remove Foley, will need a voiding trial as an outpatient at the urologist office.Marland Kitchen  History of Paroxysmal atrial fibrillation: Noted.  Volume depletion: Was started on IV fluids now resolved.  Essential hypertension: Continue amlodipine Coreg and hydralazine. Blood pressure is fairly controlled.  Disposition: Memory care unit will not take him with a Foley in place he will need his Foley for acute urinary retention, he will need this for at least 2 weeks then he will follow-up with urology as an outpatient and have a voiding trial then.  Awaiting placement to skilled nursing facility.   DVT prophylaxis: lovenox Family Communication:none Status is: Inpatient  Remains inpatient appropriate because:Hemodynamically unstable   Dispo: The patient is from: Group home              Anticipated d/c is to: Memory care unit              Patient currently is medically stable to d/c.   Difficult to place patient Yes        Code Status:     Code Status Orders  (From admission, onward)         Start     Ordered    07/22/20 0140  Full code  Continuous        07/22/20 0141        Code Status History    Date Active Date Inactive Code Status Order ID Comments User Context   06/26/2020 1200 07/15/2020 0324 DNR [id number]  Patrick Rumpf, NP Inpatient   06/11/2020 0542 06/26/2020 1200 Full Code [id number]  Patrick Quill, DO ED   07/24/2017 2013 07/26/2017 2026 Full Code [id number]  Patrick Quill, DO ED   10/17/2013 0319 10/17/2013 1343 Full Code [id number]  Patrick Grout, MD Inpatient   Advance Care Planning Activity        IV Access:    Peripheral IV   Procedures and diagnostic studies:   No results found.   Medical Consultants:    None.   Subjective:    Patrick Patrick has no complaints.  Objective:    Vitals:   07/28/20 1556 07/28/20 1911 07/29/20 0419 07/29/20 0423  BP: 128/70 (!) 166/61  (!) 151/57  Pulse: 65 73  (!) 51  Resp: 20 20  18   Temp: 98.7 F (37.1 C) 98.5 F (36.9 C)  98.6 F (37 C)  TempSrc: Axillary Axillary  Axillary  SpO2: 98% 97%  99%  Weight:   73.1 kg    SpO2: 99 % O2 Flow Rate (L/min): 0 L/min   Intake/Output Summary (Last 24 hours) at 07/29/2020 4709 Last data filed at 07/29/2020 0830  Gross per 24 hour  Intake 1257 ml  Output 1975 ml  Net -718 ml   Filed Weights   07/27/20 0500 07/28/20 0541 07/29/20 0419  Weight: 75.9 kg 73.7 kg 73.1 kg    Exam: General exam: In no acute distress. Respiratory system: Good air movement and clear to auscultation. Cardiovascular system: S1 & S2 heard, RRR. No JVD. Gastrointestinal system: Abdomen is nondistended, soft and nontender.  Extremities: No pedal edema. Skin: No rashes, lesions or ulcers  Data Reviewed:    Labs: Basic Metabolic Panel: Recent Labs  Lab 07/23/20 0256 07/24/20 0220  NA 140 138  K 3.3* 3.7  CL 107 107  CO2 27 26  GLUCOSE 115* 127*  BUN 11 11  CREATININE 0.98 0.82  CALCIUM 8.6* 8.8*   GFR Estimated Creatinine Clearance: 75.5 mL/min (by C-G formula based on SCr of  0.82 mg/dL). Liver Function Tests: Recent Labs  Lab 07/23/20 0256 07/24/20 0220  AST 33 27  ALT 38 38  ALKPHOS 34* 37*  BILITOT 0.7 0.6  PROT 4.7* 4.8*  ALBUMIN 2.6* 2.6*   No results for input(s): LIPASE, AMYLASE in the last 168 hours. No results for input(s): AMMONIA in the last 168 hours. Coagulation profile No results for input(s): INR, PROTIME in the last 168 hours. COVID-19 Labs  No results for input(s): DDIMER, FERRITIN, LDH, CRP in the last 72 hours.  Lab Results  Component Value Date   SARSCOV2NAA POSITIVE (A) 07/21/2020   SARSCOV2NAA POSITIVE (A) 06/11/2020   [place] NEGATIVE 01/13/2020    CBC: Recent Labs  Lab 07/23/20 0256 07/24/20 0220 07/25/20 0612 07/26/20 0205 07/27/20 0340  WBC 9.0 8.9 11.4* 8.9 10.7*  NEUTROABS 5.4 5.1 8.1* 5.6 7.0  HGB 11.0* 11.6* 11.8* 11.5* 12.0*  HCT 33.4* 35.3* 35.9* 35.3* 36.5*  MCV 87.7 87.6 88.0 88.5 87.5  PLT 208 218 228 218 230   Cardiac Enzymes: No results for input(s): CKTOTAL, CKMB, CKMBINDEX, TROPONINI in the last 168 hours. BNP (last 3 results) No results for input(s): PROBNP in the last 8760 hours. CBG: Recent Labs  Lab 07/28/20 0540 07/28/20 1118 07/28/20 1639 07/28/20 2014 07/29/20 0612  GLUCAP 189* 287* 137* 160* 253*   D-Dimer: No results for input(s): DDIMER in the last 72 hours. Hgb A1c: No results for input(s): HGBA1C in the last 72 hours. Lipid Profile: No results for input(s): CHOL, HDL, LDLCALC, TRIG, CHOLHDL, LDLDIRECT in the last 72 hours. Thyroid function studies: No results for input(s): TSH, T4TOTAL, T3FREE, THYROIDAB in the last 72 hours.  Invalid input(s): FREET3 Anemia work up: No results for input(s): VITAMINB12, FOLATE, FERRITIN, TIBC, IRON, RETICCTPCT in the last 72 hours. Sepsis Labs: Recent Labs  Lab 07/24/20 0220 07/25/20 0612 07/26/20 0205 07/27/20 0340  WBC 8.9 11.4* 8.9 10.7*   Microbiology Recent Results (from the past 240 hour(s))  Resp Panel by RT-PCR  (Flu A&B, Covid) Nasopharyngeal Swab     Status: Abnormal   Collection Time: 07/21/20 11:48 PM   Specimen: Nasopharyngeal Swab; Nasopharyngeal(NP) swabs in vial transport medium  Result Value Ref Range Status   SARS Coronavirus 2 by RT PCR POSITIVE (A) NEGATIVE Final    Comment: RESULT CALLED TO, READ BACK BY AND VERIFIED WITH: RN GEORGE N. 0124 [phone] FCP (NOTE) SARS-CoV-2 target nucleic acids are DETECTED.  The SARS-CoV-2 RNA is generally detectable in upper respiratory specimens during the acute phase of infection. Positive results are indicative of the presence of the identified virus, but do not rule out bacterial infection or  co-infection with other pathogens not detected by the test. Clinical correlation with patient history and other diagnostic information is necessary to determine patient infection status. The expected result is Negative.  Fact Sheet for Patients: EntrepreneurPulse.com.au  Fact Sheet for Healthcare Providers: IncredibleEmployment.be  This test is not yet approved or cleared by the [place] FDA and  has been authorized for detection and/or diagnosis of SARS-CoV-2 by FDA under an Emergency Use Authorization (EUA).  This EUA will remain in effect (meaning this test can be used ) for the duration of  the COVID-19 declaration under Section 564(b)(1) of the Act, 21 U.S.C. section 360bbb-3(b)(1), unless the authorization is terminated or revoked sooner.     Influenza A by PCR NEGATIVE NEGATIVE Final   Influenza B by PCR NEGATIVE NEGATIVE Final    Comment: (NOTE) The Xpert Xpress SARS-CoV-2/FLU/RSV plus assay is intended as an aid in the diagnosis of influenza from Nasopharyngeal swab specimens and should not be used as a sole basis for treatment. Nasal washings and aspirates are unacceptable for Xpert Xpress SARS-CoV-2/FLU/RSV testing.  Fact Sheet for Patients: EntrepreneurPulse.com.au  Fact Sheet  for Healthcare Providers: IncredibleEmployment.be  This test is not yet approved or cleared by the [place] FDA and has been authorized for detection and/or diagnosis of SARS-CoV-2 by FDA under an Emergency Use Authorization (EUA). This EUA will remain in effect (meaning this test can be used) for the duration of the COVID-19 declaration under Section 564(b)(1) of the Act, 21 U.S.C. section 360bbb-3(b)(1), unless the authorization is terminated or revoked.  Performed at [facility], [place] [address] [place], [place] [address]      Medications:   . aspirin EC  81 mg Oral Daily  . carvedilol  3.125 mg Oral BID WC  . Chlorhexidine Gluconate Cloth  6 each Topical Daily  . escitalopram  10 mg Oral Daily  . feeding supplement  237 mL Oral TID BM  . fluticasone  2 spray Each Nare Daily  . insulin aspart  0-15 Units Subcutaneous TID AC & HS  . multivitamin with minerals  1 tablet Oral Daily  . potassium chloride  40 mEq Oral Daily  . QUEtiapine  50 mg Oral Q2000  . tamsulosin  0.4 mg Oral Daily   Continuous Infusions:    LOS: 7 days   Charlynne Cousins  [facility]  07/29/2020, 9:17 AM

## 2020-07-29 NOTE — TOC Progression Note (Signed)
Transition of Care [facility]) - Progression Note    Patient Details  Name: Patrick Phillips MRN: [id number] Date of Birth: Jul 01, 1940  Transition of Care [facility]) CM/SW [place], [place] Phone Number: 07/29/2020, 4:30 PM  Clinical Narrative:     CSW uploaded addition clinicals (PT eval) to Candace Cruise IOE#[id number]       Expected Discharge Plan and Services           Expected Discharge Date: 07/27/20                                     Social Determinants of Health (SDOH) Interventions    Readmission Risk Interventions No flowsheet data found.

## 2020-07-29 NOTE — Evaluation (Signed)
Physical Therapy Evaluation Patient Details Name: Patrick Phillips MRN: [id number] DOB: [birth year] Today's Date: 07/29/2020   History of Present Illness  Pt is a 80 y/o male admitted secondary to agitation and blood in stool. Also found to have urinary retention and had foley placed. PMH includes HTN, DM, a fib, and vascular dementia.  Clinical Impression  Pt admitted secondary to problem above with deficits below. Pt with cognitive deficits at baseline and required safety cues throughout mobility. Requiring min to min guard A for mobility using RW. Recommending SNF level therapies at d/c to increase independence and safety with mobility. Will continue to follow acutely.     Follow Up Recommendations SNF;Supervision/Assistance - 24 hour    Equipment Recommendations  None recommended by PT    Recommendations for Other Services       Precautions / Restrictions Precautions Precautions: Fall Restrictions Weight Bearing Restrictions: No      Mobility  Bed Mobility Overal bed mobility: Needs Assistance Bed Mobility: Supine to Sit;Sit to Supine     Supine to sit: Supervision Sit to supine: Min assist   General bed mobility comments: Supervision for safety. Min A for LE for returning to supine.    Transfers Overall transfer level: Needs assistance Equipment used: Rolling walker (2 wheeled) Transfers: Sit to/from Stand Sit to Stand: Min assist         General transfer comment: Min A for steadying assist to stand. Pt pulling up on RW.  Ambulation/Gait Ambulation/Gait assistance: Min guard Gait Distance (Feet): 20 Feet Assistive device: Rolling walker (2 wheeled) Gait Pattern/deviations: Step-through pattern;Decreased stride length Gait velocity: Decreased   General Gait Details: Pt requiring directional cues for mobility. Required manual assist for RW management as pt tended to run into objects with RW. Easily distracted during mobility tasks.  Stairs             Wheelchair Mobility    Modified Rankin (Stroke Patients Only)       Balance Overall balance assessment: Needs assistance Sitting-balance support: No upper extremity supported;Feet supported Sitting balance-Leahy Scale: Good     Standing balance support: Bilateral upper extremity supported;During functional activity Standing balance-Leahy Scale: Poor Standing balance comment: Reliant on UE support                             Pertinent Vitals/Pain Pain Assessment: Faces Faces Pain Scale: No hurt    Home Living Family/patient expects to be discharged to:: Skilled nursing facility                 Additional Comments: From Memory care    Prior Function           Comments: Pt unable to report prior level of function. Anticipate pt likely required assist for mobility tasks.     Hand Dominance        Extremity/Trunk Assessment   Upper Extremity Assessment Upper Extremity Assessment: Generalized weakness    Lower Extremity Assessment Lower Extremity Assessment: Generalized weakness    Cervical / Trunk Assessment Cervical / Trunk Assessment: Kyphotic  Communication   Communication: No difficulties  Cognition Arousal/Alertness: Awake/alert Behavior During Therapy: Flat affect Overall Cognitive Status: History of cognitive impairments - at baseline                                 General Comments: Dementia at baseline. Slightly impulsive and required safety  cues for mobility. Required directional cues as well for RW use.      General Comments      Exercises     Assessment/Plan    PT Assessment Patient needs continued PT services  PT Problem List Decreased cognition;Decreased activity tolerance;Decreased safety awareness;Decreased balance;Decreased mobility;Decreased strength;Decreased knowledge of precautions;Decreased knowledge of use of DME       PT Treatment Interventions Gait training;Functional mobility  training;Therapeutic exercise;Balance training;Patient/family education;Cognitive remediation;DME instruction;Therapeutic activities    PT Goals (Current goals can be found in the Care Plan section)  Acute Rehab PT Goals PT Goal Formulation: Patient unable to participate in goal setting Time For Goal Achievement: 08/12/20 Potential to Achieve Goals: Good    Frequency Min 2X/week   Barriers to discharge        Co-evaluation               AM-PAC PT "6 Clicks" Mobility  Outcome Measure Help needed turning from your back to your side while in a flat bed without using bedrails?: None Help needed moving from lying on your back to sitting on the side of a flat bed without using bedrails?: A Little Help needed moving to and from a bed to a chair (including a wheelchair)?: A Little Help needed standing up from a chair using your arms (e.g., wheelchair or bedside chair)?: A Little Help needed to walk in hospital room?: A Little Help needed climbing 3-5 steps with a railing? : A Little 6 Click Score: 19    End of Session Equipment Utilized During Treatment: Gait belt Activity Tolerance: Patient tolerated treatment well Patient left: in bed;with call bell/phone within reach;with bed alarm set;with nursing/sitter in room Nurse Communication: Mobility status PT Visit Diagnosis: Unsteadiness on feet (R26.81);Muscle weakness (generalized) (M62.81)    Time: 7829-5621 PT Time Calculation (min) (ACUTE ONLY): 12 min   Charges:   PT Evaluation $PT Eval Moderate Complexity: 1 Mod          Reuel Derby, PT, DPT  Acute Rehabilitation Services  Pager: [phone] Office: [phone]   Rudean Hitt 07/29/2020, 3:18 PM

## 2020-07-30 DIAGNOSIS — K529 Noninfective gastroenteritis and colitis, unspecified: Secondary | ICD-10-CM | POA: Diagnosis not present

## 2020-07-30 DIAGNOSIS — I10 Essential (primary) hypertension: Secondary | ICD-10-CM | POA: Diagnosis not present

## 2020-07-30 DIAGNOSIS — D649 Anemia, unspecified: Secondary | ICD-10-CM | POA: Diagnosis not present

## 2020-07-30 DIAGNOSIS — F0151 Vascular dementia with behavioral disturbance: Secondary | ICD-10-CM | POA: Diagnosis not present

## 2020-07-30 LAB — GLUCOSE, CAPILLARY: Glucose-Capillary: 157 mg/dL — ABNORMAL HIGH (ref 70–99)

## 2020-07-30 LAB — RESP PANEL BY RT-PCR (FLU A&B, COVID) ARPGX2
Influenza A by PCR: NEGATIVE
Influenza B by PCR: NEGATIVE
SARS Coronavirus 2 by RT PCR: NEGATIVE

## 2020-07-30 NOTE — Progress Notes (Signed)
Pt ate dinner at nurses station. Pt ate 100%

## 2020-07-30 NOTE — Progress Notes (Signed)
Pt at nurses' station. Pt stable.

## 2020-07-30 NOTE — TOC Transition Note (Signed)
Transition of Care [facility]) - CM/SW Discharge Note   Patient Details  Name: Patrick Phillips MRN: [id number] Date of Birth: 1940/05/12  [facility] of [facility]) CM/SW Contact:  Tresa Endo Phone Number: 07/30/2020, 11:39 AM   Clinical Narrative:    Patient will DC to: [facility] Anticipated DC date: 07/30/2020 Family notified: Pt Daughter Transport by: Corey Harold   Per MD patient ready for DC to [facility]. RN to call report prior to discharge [phone]). RN, patient, patient's family, and facility notified of DC. Discharge Summary and FL2 sent to facility. DC packet on chart. Ambulance transport requested for patient.   CSW will sign off for now as social work intervention is no longer needed. Please consult [place] again if new needs arise.           Patient Goals and CMS Choice        Discharge Placement                       Discharge Plan and Services                                     Social Determinants of Health (SDOH) Interventions     Readmission Risk Interventions No flowsheet data found.

## 2020-07-30 NOTE — Discharge Summary (Addendum)
Physician Discharge Summary  Shermaine Brigham EHU:[id number] DOB: Dec 22, 1940 DOA: 07/21/2020  PCP: [name], MD  Admit date: 07/21/2020 Discharge date: 07/30/2020  Admitted From: Memory care unit Disposition: Memory care unit   Recommendations for Outpatient Follow-up:  Follow up with PCP in 1-2 weeks Please obtain BMP/CBC in one week Palliative Care to meet with family at facility to discuss goals of care  Home Health:no Equipment/Devices:None  Discharge Condition:Stable CODE STATUS:Full Diet recommendation: Heart Healthy   Brief/Interim Summary: 80 y.o. male who resides in a memory care unit due to vascular advanced dementia diabetes and hypertension recently discharged on 07/14/2020 on Seroquel and Depakote to the memory unit return to the emergency room 07/22/2020 for blood in stool and agitation Discharge Diagnoses:  Principal Problem:   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[place]) Active Problems:   Hypertension   Type 2 diabetes mellitus ([place])   COVID-19 virus infection   Vascular dementia ([place])   Acute colitis   Volume depletion   Normocytic anemia  Concern about blood in stools: No signs of infectious colitis imaging was unremarkable no bloody bowel movements in the hospital antibiotics were stopped.  Vascular dementia: Psychiatry was consulted recommended to be started on Lexapro and Seroquel if you continue as an out patient he will be sent to a memory care unit.  Acute urinary retention: Foley was placed by urology he was started on Flomax urology recommended to continue the Foley and in 2 weeks and follow-up with them as an outpatient for outpatient voiding trial.  History of paroxysmal A. fib: On anticoagulation noted.  Essential hypertension: No changes made to his medication.  Covid -8 is no longer an active infection despite testing positive  Discharge Instructions  Discharge Instructions     Diet - low sodium heart healthy   Complete by: As  directed    Diet - low sodium heart healthy   Complete by: As directed    Discharge wound care:   Complete by: As directed    Per wound care instructions   Increase activity slowly   Complete by: As directed    Increase activity slowly   Complete by: As directed    No wound care   Complete by: As directed       Allergies as of 07/30/2020       Reactions   Aricept [donepezil]    Per daughter, increased mood swings and angry        Medication List     STOP taking these medications    amLODipine 5 MG tablet Commonly known as: NORVASC   atorvastatin 40 MG tablet Commonly known as: LIPITOR   cyanocobalamin 1000 MCG tablet   divalproex 125 MG capsule Commonly known as: DEPAKOTE SPRINKLE   fluticasone 50 MCG/ACT nasal spray Commonly known as: FLONASE   guaifenesin 100 MG/5ML syrup Commonly known as: ROBITUSSIN   loperamide 2 MG capsule Commonly known as: IMODIUM   LORazepam 0.5 MG tablet Commonly known as: Ativan   magnesium hydroxide 400 MG/5ML suspension Commonly known as: MILK OF MAGNESIA   polyethylene glycol 17 g packet Commonly known as: MIRALAX / GLYCOLAX   senna 8.6 MG Tabs tablet Commonly known as: SENOKOT       TAKE these medications    acetaminophen 500 MG tablet Commonly known as: TYLENOL Take 500 mg by mouth every 6 (six) hours as needed for headache, fever or mild pain.   alum & mag hydroxide-simeth 200-200-20 MG/5ML suspension Commonly known as: MAALOX/MYLANTA Take 30 mLs  by mouth every 6 (six) hours as needed for indigestion or heartburn.   aspirin EC 81 MG tablet Take 1 tablet (81 mg total) by mouth daily.   carvedilol 6.25 MG tablet Commonly known as: COREG Take 1 tablet (6.25 mg total) by mouth 2 (two) times daily with a meal.   escitalopram 10 MG tablet Commonly known as: LEXAPRO Take 1 tablet (10 mg total) by mouth daily.   esomeprazole 40 MG capsule Commonly known as: NEXIUM Take 1 capsule (40 mg total) by mouth  daily.   feeding supplement Liqd Take 237 mLs by mouth 2 (two) times daily between meals.   hydrALAZINE 100 MG tablet Commonly known as: APRESOLINE Take 1 tablet (100 mg total) by mouth every 8 (eight) hours.   metFORMIN 1000 MG tablet Commonly known as: GLUCOPHAGE Take 1 tablet (1,000 mg total) by mouth 2 (two) times daily.   multivitamin with minerals Tabs tablet Take 1 tablet by mouth daily.   neomycin-bacitracin-polymyxin [phone] ointment Apply 1 application topically daily as needed (minor skin tears/abrasions).   QUEtiapine 50 MG tablet Commonly known as: SEROQUEL Take 1 tablet (50 mg total) by mouth daily at 8 pm.   tamsulosin 0.4 MG Caps capsule Commonly known as: FLOMAX Take 1 capsule (0.4 mg total) by mouth daily.   thiamine 100 MG tablet Take 1 tablet (100 mg total) by mouth daily.               Discharge Care Instructions  (From admission, onward)           Start     Ordered   07/30/20 0000  Discharge wound care:       Comments: Per wound care instructions   07/30/20 8416            Allergies  Allergen Reactions   Aricept [Donepezil]     Per daughter, increased mood swings and angry    Consultations: Urology Psychiatry   Procedures/Studies: CT ABDOMEN PELVIS WO CONTRAST  Result Date: 07/21/2020 CLINICAL DATA:  Abdominal distension.  Blood in stool. EXAM: CT ABDOMEN AND PELVIS WITHOUT CONTRAST TECHNIQUE: Multidetector CT imaging of the abdomen and pelvis was performed following the standard protocol without IV contrast. COMPARISON:  CT 10/30/2017 FINDINGS: Lower chest: Small right lower lobe pulmonary nodules are unchanged from prior exam, benign. No acute airspace disease or pleural effusion. Stable heart size with coronary artery calcifications. Hepatobiliary: No focal liver abnormality is seen. No gallstones, gallbladder wall thickening, or biliary dilatation. Pancreas: No ductal dilatation or inflammation. Spleen: Normal in size  without focal abnormality. Adrenals/Urinary Tract: Normal adrenal glands. Prominence of both renal collecting systems is felt to be due to degree of bladder distension. No evidence of renal or ureteral calculi. Bladder is distended with volume of 13.2 x 9.5 x 16 cm (volume = 1100 cm^3). Minimal wall thickening about the bladder dome. Possible bladder dome diverticulum. Cysts arising from the mid lower right kidney. Small hyperdense lesion in the upper left kidney is incompletely characterized. Stomach/Bowel: Small hiatal hernia. No small bowel obstruction or inflammation. Small volume of formed stool in the ascending and proximal transverse colon. More distal colon is decompressed which limits assessment for wall thickening. There may be mild sigmoid wall thickening and edema. Appendix not well visualized on the current exam, no appendicitis. Vascular/Lymphatic: Aortic atherosclerosis without aneurysm. Prominent retroperitoneal lymph nodes are similar to prior exam. Prominent central mesenteric nodes measuring up to 15 mm, series 3, image 36, also similar to prior exam. Mild  chronic misty mesentery in the left upper quadrant. Reproductive: Enlarged prostate gland causing mass effect on the bladder base. Prostate spans 5.3 cm transverse. Other: No free air or ascites. Previous left inguinal hernia is no longer seen. Musculoskeletal: Degenerative change throughout the lumbar spine. There are no acute or suspicious osseous abnormalities. IMPRESSION: 1. Possible sigmoid colitis with mild wall thickening and edema. 2. 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 3. Distended urinary bladder with mild wall thickening. Prominence of both renal collecting systems is felt to be due to degree of bladder distension. 4. 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 Aortic Atherosclerosis (ICD10-I70.0). Electronically Signed   By: Keith Rake M.D.   On: 07/21/2020 22:47    Portable chest 1 View  Result Date: 07/22/2020 CLINICAL DATA:  Shortness of breath and COVID-19 EXAM: PORTABLE CHEST 1 VIEW COMPARISON:  Chest x-ray 07/04/2020, CT chest 05/01/2018 FINDINGS: The heart size and mediastinal contours are unchanged. Aortic calcifications. Wireless cardiac device overlies the left chest. No focal consolidation. No pulmonary edema. No pleural effusion. No pneumothorax. No acute osseous abnormality. IMPRESSION: No active disease. Electronically Signed   By: Iven Finn M.D.   On: 07/22/2020 02:48   DG CHEST PORT 1 VIEW  Result Date: 07/04/2020 CLINICAL DATA:  Dyspnea EXAM: PORTABLE CHEST 1 VIEW COMPARISON:  06/25/2020 FINDINGS: Cardiac shadow is within normal limits. The lungs are clear bilaterally. Loop recorder is noted on the left. No bony abnormality is seen. IMPRESSION: No acute abnormality noted. Electronically Signed   By: Inez Catalina M.D.   On: 07/04/2020 21:41   DG Swallowing Func-Speech Pathology  Result Date: 07/02/2020 Objective Swallowing Evaluation: Type of Study: Bedside Swallow Evaluation  Patient Details Name: Zavior Thomason MRN: [id number] Date of Birth: 1940-07-11 Today's Date: 07/02/2020 Time: SLP Start Time (ACUTE ONLY): 26 -SLP Stop Time (ACUTE ONLY): 1249 SLP Time Calculation (min) (ACUTE ONLY): 19 min Past Medical History: Past Medical History: Diagnosis Date  Cancer ([place])   skin cancer  Dementia ([place])   Diabetes mellitus without complication ([place])   Hypertension   Memory deficit  Past Surgical History: Past Surgical History: Procedure Laterality Date  BACK SURGERY    HERNIA REPAIR    LOOP RECORDER INSERTION N/A 07/26/2017  Procedure: LOOP RECORDER INSERTION;  Surgeon: Evans Lance, MD;  Location: [facility];  Service: Cardiovascular;  Laterality: N/A;  TEE WITHOUT CARDIOVERSION N/A 07/26/2017  Procedure: TRANSESOPHAGEAL ECHOCARDIOGRAM (TEE);  Surgeon: Fay Records, MD;  Location: [facility] ENDOS[facility];  Service: Cardiovascular;  Laterality: N/A;   TRIGGER FINGER RELEASE Right 12/02/2015  Procedure: RIGHT THUMB RELEASE TRIGGER FINGER/A-1 PULLEY;  Surgeon: Leanora Cover, MD;  Location: [facility];  Service: Orthopedics;  Laterality: Right; HPI: Pt is a 80 y.o. male with PMHx of vascular dementia, CVA, DM-2, HTN. On 4/14, pt took his dog for a walk and the dog returned home without him. Pt was found lying on the side of road with no memory of what had happened but able to follow commands. Pt was taken to [facility] and d/c home with daughter following negative workup, but pt was brough to [facility] ED due to increased confusion. Pt found to be febrile and COVID+ and admitted for acute metabolic encephalopathy. MRI brain 4/14 negative for acute changes. CXR 4/15 negative for active disease. EEG 4/16: mild to moderate diffuse encephalopathy, nonspecific etiology. No seizures or epileptiform discharges. Psych consulted due to severe delirium.  No data recorded Assessment / Plan /  Recommendation CHL IP CLINICAL IMPRESSIONS 07/02/2020 Clinical Impression Pt presented with oropharyngeal dysphagia characterized by impaired bolus control and a pharyngeal delay. He demonstrated premature spillage to the pyriform sinuses, penetration (PAS 5) and aspiration (PAS 7, 8) of thin liquids via cup and straw. Pt was unable to demonstrate compensatory strategies despite verbal prompts and cues and aspiration persisted despite use of 5cc and 10cc boluses. Laryngeal invasion was improved to PAS 2 with nectar thick liquids which is considered WNL. Aspiration resulted in an ineffective cough on two occasions, but all other instances were silent. Per EMR, pt has required augmentative IV fluids every few days due to poor p.o. intake, and SLP anticipates that use of nectar thick liquids to eliminate aspiration would likely not improve this. It is anticipated that the pt has had intermittent aspiration of thin liquids throughout this 21-day admission. Pt's daughter, [name], was contacted via  phone and she reported that the pt had also been inconsistently symptomic of aspiration prior to admission so his aspiration may be chronic issue. Pt is ambulatory, afebrile, and all five of his CXRs have been negative for active disease. Pt's case was discussed with Dr. Waldron Labs including potential options for dysphagia management considering dehydration risk vs pulmonary risk. Considering his tolerance of intermittent aspiration thus far, it was agreed that his current diet will be continued with strict observance of swallowing precautions, consistent pt mobilization, and consistent oral care. Pt's daughter, [name], was educated regarding the pt's performance, concerns regarding potential for dehydration due to avoidance of liquids, and the potential progression of dysphagia in the setting of dementia. All of her questions were answered and she verbalized agreement with plan of care. SLP Visit Diagnosis Dysphagia, oropharyngeal phase (R13.12) Attention and concentration deficit following -- Frontal lobe and executive function deficit following -- Impact on safety and function Mild aspiration risk;Moderate aspiration risk   CHL IP TREATMENT RECOMMENDATION 07/02/2020 Treatment Recommendations Therapy as outlined in treatment plan below   Prognosis 07/02/2020 Prognosis for Safe Diet Advancement Fair Barriers to Reach Goals Cognitive deficits Barriers/Prognosis Comment -- CHL IP DIET RECOMMENDATION 07/02/2020 SLP Diet Recommendations Dysphagia 3 (Mech soft) solids;Thin liquid Liquid Administration via Cup;No straw Medication Administration Whole meds with puree Compensations Minimize environmental distractions;Slow rate;Small sips/bites Postural Changes Seated upright at 90 degrees   CHL IP OTHER RECOMMENDATIONS 07/02/2020 Recommended Consults -- Oral Care Recommendations Oral care BID;Staff/trained caregiver to provide oral care Other Recommendations --   CHL IP FOLLOW UP RECOMMENDATIONS 07/02/2020 Follow up Recommendations  Skilled Nursing facility   [facility] IP FREQUENCY AND DURATION 07/02/2020 Speech Therapy Frequency (ACUTE ONLY) min 1 x/week Treatment Duration 2 weeks      CHL IP ORAL PHASE 07/02/2020 Oral Phase Impaired Oral - Pudding Teaspoon -- Oral - Pudding Cup -- Oral - Honey Teaspoon -- Oral - Honey Cup -- Oral - Nectar Teaspoon -- Oral - Nectar Cup -- Oral - Nectar Straw Premature spillage;Decreased bolus cohesion Oral - Thin Teaspoon -- Oral - Thin Cup Premature spillage;Decreased bolus cohesion Oral - Thin Straw Premature spillage;Decreased bolus cohesion Oral - Puree Decreased bolus cohesion;Premature spillage Oral - Mech Soft -- Oral - Regular WFL Oral - Multi-Consistency -- Oral - Pill Decreased bolus cohesion;Premature spillage Oral Phase - Comment --  CHL IP PHARYNGEAL PHASE 07/02/2020 Pharyngeal Phase Impaired Pharyngeal- Pudding Teaspoon -- Pharyngeal -- Pharyngeal- Pudding Cup -- Pharyngeal -- Pharyngeal- Honey Teaspoon -- Pharyngeal -- Pharyngeal- Honey Cup -- Pharyngeal -- Pharyngeal- Nectar Teaspoon -- Pharyngeal -- Pharyngeal- Nectar Cup Delayed swallow initiation-vallecula  Pharyngeal -- Pharyngeal- Nectar Straw Delayed swallow initiation-vallecula;Delayed swallow initiation-pyriform sinuses;Penetration/Aspiration during swallow Pharyngeal Material enters airway, remains ABOVE vocal cords then ejected out Pharyngeal- Thin Teaspoon -- Pharyngeal -- Pharyngeal- Thin Cup Delayed swallow initiation-pyriform sinuses;Penetration/Aspiration during swallow;Penetration/Apiration after swallow Pharyngeal Material enters airway, CONTACTS cords and not ejected out;Material enters airway, passes BELOW cords and not ejected out despite cough attempt by patient;Material enters airway, passes BELOW cords without attempt by patient to eject out (silent aspiration) Pharyngeal- Thin Straw Delayed swallow initiation-pyriform sinuses;Penetration/Aspiration during swallow;Penetration/Apiration after swallow Pharyngeal Material enters airway,  CONTACTS cords and not ejected out;Material enters airway, passes BELOW cords without attempt by patient to eject out (silent aspiration);Material enters airway, passes BELOW cords and not ejected out despite cough attempt by patient Pharyngeal- Puree Delayed swallow initiation-vallecula Pharyngeal -- Pharyngeal- Mechanical Soft -- Pharyngeal -- Pharyngeal- Regular Delayed swallow initiation-vallecula Pharyngeal -- Pharyngeal- Multi-consistency -- Pharyngeal -- Pharyngeal- Pill Delayed swallow initiation-vallecula Pharyngeal -- Pharyngeal Comment --  CHL IP CERVICAL ESOPHAGEAL PHASE 07/02/2020 Cervical Esophageal Phase WFL Pudding Teaspoon -- Pudding Cup -- Honey Teaspoon -- Honey Cup -- Nectar Teaspoon -- Nectar Cup -- Nectar Straw -- Thin Teaspoon -- Thin Cup -- Thin Straw -- Puree -- Mechanical Soft -- Regular -- Multi-consistency -- Pill -- Cervical Esophageal Comment -- Shanika I. Hardin Negus, [place], [place] Office number [phone] Pager [phone] Horton Marshall 07/02/2020, 3:23 PM               Subjective: No complaints  Discharge Exam: Vitals:   07/29/20 1127 07/29/20 1906  BP: (!) 150/67 (!) 158/65  Pulse: 62 64  Resp: 18 18  Temp: 98.2 F (36.8 C) 98.5 F (36.9 C)  SpO2: 96% 96%   Vitals:   07/29/20 0423 07/29/20 1127 07/29/20 1906 07/30/20 0654  BP: (!) 151/57 (!) 150/67 (!) 158/65   Pulse: (!) 51 62 64   Resp: 18 18 18    Temp: 98.6 F (37 C) 98.2 F (36.8 C) 98.5 F (36.9 C)   TempSrc: Axillary Oral Oral   SpO2: 99% 96% 96%   Weight:    72 kg    General: Pt is alert, awake, not in acute distress Cardiovascular: RRR, S1/S2 +, no rubs, no gallops Respiratory: CTA bilaterally, no wheezing, no rhonchi Abdominal: Soft, NT, ND, bowel sounds + Extremities: no edema, no cyanosis    The results of significant diagnostics from this hospitalization (including imaging, microbiology, ancillary and laboratory) are listed below for reference.      Microbiology: Recent Results (from the past 240 hour(s))  Resp Panel by RT-PCR (Flu A&B, Covid) Nasopharyngeal Swab     Status: Abnormal   Collection Time: 07/21/20 11:48 PM   Specimen: Nasopharyngeal Swab; Nasopharyngeal(NP) swabs in vial transport medium  Result Value Ref Range Status   SARS Coronavirus 2 by RT PCR POSITIVE (A) NEGATIVE Final    Comment: RESULT CALLED TO, READ BACK BY AND VERIFIED WITH: RN GEORGE N. [phone] FCP (NOTE) SARS-CoV-2 target nucleic acids are DETECTED.  The SARS-CoV-2 RNA is generally detectable in upper respiratory specimens during the acute phase of infection. Positive results are indicative of the presence of the identified virus, but do not rule out bacterial infection or co-infection with other pathogens not detected by the test. Clinical correlation with patient history and other diagnostic information is necessary to determine patient infection status. The expected result is Negative.  Fact Sheet for Patients: EntrepreneurPulse.com.au  Fact Sheet for Healthcare Providers: IncredibleEmployment.be  This test is not yet  approved or cleared by the [place] and  has been authorized for detection and/or diagnosis of SARS-CoV-2 by FDA under an Emergency Use Authorization (EUA).  This EUA will remain in effect (meaning this test can be used ) for the duration of  the COVID-19 declaration under Section 564(b)(1) of the Act, 21 U.S.C. section 360bbb-3(b)(1), unless the authorization is terminated or revoked sooner.     Influenza A by PCR NEGATIVE NEGATIVE Final   Influenza B by PCR NEGATIVE NEGATIVE Final    Comment: (NOTE) The Xpert Xpress SARS-CoV-2/FLU/RSV plus assay is intended as an aid in the diagnosis of influenza from Nasopharyngeal swab specimens and should not be used as a sole basis for treatment. Nasal washings and aspirates are unacceptable for Xpert Xpress  SARS-CoV-2/FLU/RSV testing.  Fact Sheet for Patients: EntrepreneurPulse.com.au  Fact Sheet for Healthcare Providers: IncredibleEmployment.be  This test is not yet approved or cleared by the [place] FDA and has been authorized for detection and/or diagnosis of SARS-CoV-2 by FDA under an Emergency Use Authorization (EUA). This EUA will remain in effect (meaning this test can be used) for the duration of the COVID-19 declaration under Section 564(b)(1) of the Act, 21 U.S.C. section 360bbb-3(b)(1), unless the authorization is terminated or revoked.  Performed at [facility], [place] [address], [place], [place] [address]      Labs: BNP (last 3 results) Recent Labs    06/27/20 0052 06/28/20 0044 07/22/20 0220  BNP 48.0 39.4 24.0   Basic Metabolic Panel: Recent Labs  Lab 07/24/20 0220  NA 138  K 3.7  CL 107  CO2 26  GLUCOSE 127*  BUN 11  CREATININE 0.82  CALCIUM 8.8*   Liver Function Tests: Recent Labs  Lab 07/24/20 0220  AST 27  ALT 38  ALKPHOS 37*  BILITOT 0.6  PROT 4.8*  ALBUMIN 2.6*   No results for input(s): LIPASE, AMYLASE in the last 168 hours. No results for input(s): AMMONIA in the last 168 hours. CBC: Recent Labs  Lab 07/24/20 0220 07/25/20 0612 07/26/20 0205 07/27/20 0340  WBC 8.9 11.4* 8.9 10.7*  NEUTROABS 5.1 8.1* 5.6 7.0  HGB 11.6* 11.8* 11.5* 12.0*  HCT 35.3* 35.9* 35.3* 36.5*  MCV 87.6 88.0 88.5 87.5  PLT 218 228 218 230   Cardiac Enzymes: No results for input(s): CKTOTAL, CKMB, CKMBINDEX, TROPONINI in the last 168 hours. BNP: Invalid input(s): POCBNP CBG: Recent Labs  Lab 07/28/20 2014 07/29/20 0612 07/29/20 1122 07/29/20 2042 07/30/20 0651  GLUCAP 160* 253* 246* 194* 157*   D-Dimer No results for input(s): DDIMER in the last 72 hours. Hgb A1c No results for input(s): HGBA1C in the last 72 hours. Lipid Profile No results for input(s): CHOL, HDL, LDLCALC, TRIG, CHOLHDL,  LDLDIRECT in the last 72 hours. Thyroid function studies No results for input(s): TSH, T4TOTAL, T3FREE, THYROIDAB in the last 72 hours.  Invalid input(s): FREET3 Anemia work up No results for input(s): VITAMINB12, FOLATE, FERRITIN, TIBC, IRON, RETICCTPCT in the last 72 hours. Urinalysis    Component Value Date/Time   COLORURINE YELLOW 07/22/2020 2300   APPEARANCEUR CLEAR 07/22/2020 2300   LABSPEC 1.010 07/22/2020 2300   PHURINE 8.0 07/22/2020 2300   GLUCOSEU NEGATIVE 07/22/2020 2300   HGBUR SMALL (A) 07/22/2020 2300   BILIRUBINUR NEGATIVE 07/22/2020 2300   KETONESUR NEGATIVE 07/22/2020 2300   PROTEINUR NEGATIVE 07/22/2020 2300   NITRITE NEGATIVE 07/22/2020 2300   LEUKOCYTESUR NEGATIVE 07/22/2020 2300   Sepsis Labs Invalid input(s): PROCALCITONIN,  WBC,  [place] Microbiology Recent Results (from the past 240 hour(s))  Resp Panel by RT-PCR (Flu A&B, Covid) Nasopharyngeal Swab     Status: Abnormal   Collection Time: 07/21/20 11:48 PM   Specimen: Nasopharyngeal Swab; Nasopharyngeal(NP) swabs in vial transport medium  Result Value Ref Range Status   SARS Coronavirus 2 by RT PCR POSITIVE (A) NEGATIVE Final    Comment: RESULT CALLED TO, READ BACK BY AND VERIFIED WITH: RN GEORGE N. [phone] FCP (NOTE) SARS-CoV-2 target nucleic acids are DETECTED.  The SARS-CoV-2 RNA is generally detectable in upper respiratory specimens during the acute phase of infection. Positive results are indicative of the presence of the identified virus, but do not rule out bacterial infection or co-infection with other pathogens not detected by the test. Clinical correlation with patient history and other diagnostic information is necessary to determine patient infection status. The expected result is Negative.  Fact Sheet for Patients: EntrepreneurPulse.com.au  Fact Sheet for Healthcare Providers: IncredibleEmployment.be  This test is not yet approved or  cleared by the [place] FDA and  has been authorized for detection and/or diagnosis of SARS-CoV-2 by FDA under an Emergency Use Authorization (EUA).  This EUA will remain in effect (meaning this test can be used ) for the duration of  the COVID-19 declaration under Section 564(b)(1) of the Act, 21 U.S.C. section 360bbb-3(b)(1), unless the authorization is terminated or revoked sooner.     Influenza A by PCR NEGATIVE NEGATIVE Final   Influenza B by PCR NEGATIVE NEGATIVE Final    Comment: (NOTE) The Xpert Xpress SARS-CoV-2/FLU/RSV plus assay is intended as an aid in the diagnosis of influenza from Nasopharyngeal swab specimens and should not be used as a sole basis for treatment. Nasal washings and aspirates are unacceptable for Xpert Xpress SARS-CoV-2/FLU/RSV testing.  Fact Sheet for Patients: EntrepreneurPulse.com.au  Fact Sheet for Healthcare Providers: IncredibleEmployment.be  This test is not yet approved or cleared by the [place] FDA and has been authorized for detection and/or diagnosis of SARS-CoV-2 by FDA under an Emergency Use Authorization (EUA). This EUA will remain in effect (meaning this test can be used) for the duration of the COVID-19 declaration under Section 564(b)(1) of the Act, 21 U.S.C. section 360bbb-3(b)(1), unless the authorization is terminated or revoked.  Performed at [facility], [place] [address] [place], [place] [address]      Time coordinating discharge: Over 30 minutes  SIGNED:   Charlynne Cousins, MD  [facility] 07/30/2020, 9:28 AM Pager   If 7PM-7AM, please contact night-coverage [email or web address] Password [email or web address]

## 2020-07-31 ENCOUNTER — Emergency Department (HOSPITAL_COMMUNITY): Payer: Medicare Other

## 2020-07-31 ENCOUNTER — Encounter (HOSPITAL_COMMUNITY): Payer: Self-pay

## 2020-07-31 ENCOUNTER — Emergency Department (HOSPITAL_COMMUNITY)
Admission: EM | Admit: 2020-07-31 | Discharge: 2020-08-01 | Disposition: A | Discharge status: Skilled Nursing Facility | Payer: Medicare Other | Attending: Emergency Medicine | Admitting: Emergency Medicine

## 2020-07-31 ENCOUNTER — Other Ambulatory Visit: Payer: Self-pay

## 2020-07-31 DIAGNOSIS — Z7982 Long term (current) use of aspirin: Secondary | ICD-10-CM | POA: Insufficient documentation

## 2020-07-31 DIAGNOSIS — S161XXA Strain of muscle, fascia and tendon at neck level, initial encounter: Secondary | ICD-10-CM | POA: Insufficient documentation

## 2020-07-31 DIAGNOSIS — S0219XA Other fracture of base of skull, initial encounter for closed fracture: Secondary | ICD-10-CM | POA: Diagnosis not present

## 2020-07-31 DIAGNOSIS — Z79899 Other long term (current) drug therapy: Secondary | ICD-10-CM | POA: Insufficient documentation

## 2020-07-31 DIAGNOSIS — Z8616 Personal history of COVID-19: Secondary | ICD-10-CM | POA: Diagnosis not present

## 2020-07-31 DIAGNOSIS — W1830XA Fall on same level, unspecified, initial encounter: Secondary | ICD-10-CM | POA: Insufficient documentation

## 2020-07-31 DIAGNOSIS — F0390 Unspecified dementia without behavioral disturbance: Secondary | ICD-10-CM | POA: Diagnosis not present

## 2020-07-31 DIAGNOSIS — Z85828 Personal history of other malignant neoplasm of skin: Secondary | ICD-10-CM | POA: Diagnosis not present

## 2020-07-31 DIAGNOSIS — Y92129 Unspecified place in nursing home as the place of occurrence of the external cause: Secondary | ICD-10-CM | POA: Diagnosis not present

## 2020-07-31 DIAGNOSIS — Z7984 Long term (current) use of oral hypoglycemic drugs: Secondary | ICD-10-CM | POA: Diagnosis not present

## 2020-07-31 DIAGNOSIS — I10 Essential (primary) hypertension: Secondary | ICD-10-CM | POA: Insufficient documentation

## 2020-07-31 DIAGNOSIS — S0990XA Unspecified injury of head, initial encounter: Secondary | ICD-10-CM | POA: Diagnosis present

## 2020-07-31 DIAGNOSIS — E119 Type 2 diabetes mellitus without complications: Secondary | ICD-10-CM | POA: Diagnosis not present

## 2020-07-31 NOTE — ED Triage Notes (Signed)
Pt brought in by EMS from [facility] for a fall. Pt was found lying in the floor by staff. Hematoma noted to forehead. Pt has dementia and is alert to his baseline per EMS crew via staff. Pt responsive to painful stimuli, will not answer questions. C-collar in place.

## 2020-08-01 NOTE — ED Notes (Signed)
[facility] called for transport.  

## 2020-08-01 NOTE — ED Provider Notes (Signed)
[place] EMERGENCY DEPARTMENT Provider Note   CSN: [id number] Arrival date & time: 07/31/20  2244     History Chief Complaint  Patient presents with  . Fall  . Head Injury  Level 5 caveat due to dementia  Patrick Phillips is a 80 y.o. male.  The history is provided by the EMS personnel. The history is limited by the condition of the patient.  Fall This is a new problem. Nothing aggravates the symptoms. Nothing relieves the symptoms. He has tried nothing for the symptoms.  Head Injury Patient with history of dementia presents from nursing home.  Patient was found on the floor with a hematoma to his head.  Patient has history of dementia and is unable to provide any history      Past Medical History:  Diagnosis Date  . Cancer ([place])    skin cancer  . Dementia ([place])   . Diabetes mellitus without complication ([place])   . Hypertension   . Memory deficit     Patient Active Problem List   Diagnosis Date Noted  . 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[place]) 07/24/2020  . Acute colitis 07/21/2020  . Volume depletion 07/21/2020  . Normocytic anemia 07/21/2020  . Malnutrition of moderate degree 06/30/2020  . Acute metabolic encephalopathy 83/38/2505  . COVID-19 virus infection 06/11/2020  . 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 Acute ischemic stroke ([place]) 07/24/2017  . Chest pain 12/22/2015  . Chronic back pain 12/22/2015  . Syncope 10/17/2013  . Sinus bradycardia 10/17/2013  . SOB (shortness of breath) 10/17/2013  . Hypertension   . Type 2 diabetes mellitus ([place])     Past Surgical History:  Procedure Laterality Date  . BACK SURGERY    . HERNIA REPAIR    . LOOP RECORDER INSERTION N/A 07/26/2017   Procedure: LOOP RECORDER INSERTION;  Surgeon: Evans Lance, MD;  Location: [place] CV LAB;  Service: Cardiovascular;  Laterality: N/A;  . TEE WITHOUT CARDIOVERSION N/A 07/26/2017   Procedure: TRANSESOPHAGEAL ECHOCARDIOGRAM (TEE);  Surgeon: Fay Records, MD;   Location: [facility] ENDOSCOPY;  Service: Cardiovascular;  Laterality: N/A;  . TRIGGER FINGER RELEASE Right 12/02/2015   Procedure: RIGHT THUMB RELEASE TRIGGER FINGER/A-1 PULLEY;  Surgeon: Leanora Cover, MD;  Location: [place];  Service: Orthopedics;  Laterality: Right;       Family History  Problem Relation Age of Onset  . Cancer Mother   . Cancer Father   . Cancer Brother   . Cancer Other     Social History   Tobacco Use  . Smoking status: Never Smoker  . Smokeless tobacco: Never Used  Vaping Use  . Vaping Use: Never used  Substance Use Topics  . Alcohol use: Yes    Comment: occ  . Drug use: No    Home Medications Prior to Admission medications   Medication Sig Start Date End Date Taking? Authorizing Provider  acetaminophen (TYLENOL) 500 MG tablet Take 500 mg by mouth every 6 (six) hours as needed for headache, fever or mild pain.    [provider]  alum & mag hydroxide-simeth (MAALOX/MYLANTA) 200-200-20 MG/5ML suspension Take 30 mLs by mouth every 6 (six) hours as needed for indigestion or heartburn.    [provider]  aspirin EC 81 MG tablet Take 1 tablet (81 mg total) by mouth daily. 07/14/20   Ghimire, Henreitta Leber, MD  carvedilol (COREG) 6.25 MG tablet Take 1 tablet (6.25 mg total) by mouth 2 (two) times daily with a meal.  07/14/20   Ghimire, Henreitta Leber, MD  escitalopram (LEXAPRO) 10 MG tablet Take 1 tablet (10 mg total) by mouth daily. 07/27/20   Nita Sells, MD  esomeprazole (NEXIUM) 40 MG capsule Take 1 capsule (40 mg total) by mouth daily. 07/14/20   Ghimire, Henreitta Leber, MD  feeding supplement (ENSURE ENLIVE / ENSURE PLUS) LIQD Take 237 mLs by mouth 2 (two) times daily between meals. Patient not taking: Reported on 07/22/2020 07/14/20 08/13/20  Jonetta Osgood, MD  hydrALAZINE (APRESOLINE) 100 MG tablet Take 1 tablet (100 mg total) by mouth every 8 (eight) hours. 07/14/20   Ghimire, Henreitta Leber, MD  metFORMIN (GLUCOPHAGE) 1000 MG tablet Take  1 tablet (1,000 mg total) by mouth 2 (two) times daily. 07/14/20   Ghimire, Henreitta Leber, MD  Multiple Vitamin (MULTIVITAMIN WITH MINERALS) TABS tablet Take 1 tablet by mouth daily. 07/14/20   Ghimire, Henreitta Leber, MD  neomycin-bacitracin-polymyxin (NEOSPORIN) [phone] ointment Apply 1 application topically daily as needed (minor skin tears/abrasions).    [provider]  QUEtiapine (SEROQUEL) 50 MG tablet Take 1 tablet (50 mg total) by mouth daily at 8 pm. 07/14/20   Ghimire, Henreitta Leber, MD  tamsulosin (FLOMAX) 0.4 MG CAPS capsule Take 1 capsule (0.4 mg total) by mouth daily. 07/14/20   Ghimire, Henreitta Leber, MD  thiamine 100 MG tablet Take 1 tablet (100 mg total) by mouth daily. 07/14/20   Ghimire, Henreitta Leber, MD    Allergies    Aricept [donepezil]  Review of Systems   Review of Systems  Unable to perform ROS: Dementia    Physical Exam Updated Vital Signs BP (!) 158/76   Pulse 99   Temp 97.9 F (36.6 C) (Oral)   Resp 20   Ht 1.829 m (6')   Wt 72 kg   SpO2 99%   BMI 21.53 kg/m   Physical Exam CONSTITUTIONAL: Elderly, disheveled HEAD: Abrasion and hematoma to forehead EYES: EOMI, mild chemosis noted to left eye.  No other signs of orbital trauma ENMT: Mucous membranes moist, no visible oral/nasal trauma NECK: Cervical collar in place SPINE/BACK: No bruising/crepitance/stepoffs noted to spine CV: S1/S2 noted Chest - no crepitus or obvious bruising LUNGS: Lungs are clear to auscultation bilaterally, no apparent distress ABDOMEN: soft, no bruising NEURO: Pt is somnolent but arousable.  He is nonverbal EXTREMITIES: pulses normal/equal, bruising noted to left shoulder.  Pelvis stable.  All other extremities/joints palpated/ranged and nontender SKIN: warm, color normal PSYCH: Unable to assess  ED Results / Procedures / Treatments   Labs (all labs ordered are listed, but only abnormal results are displayed) Labs Reviewed - No data to display  EKG None  Radiology CT Head Wo  Contrast  Result Date: 08/01/2020 CLINICAL DATA:  Fall EXAM: CT HEAD WITHOUT CONTRAST CT MAXILLOFACIAL WITHOUT CONTRAST CT CERVICAL SPINE WITHOUT CONTRAST TECHNIQUE: Multidetector CT imaging of the head, cervical spine, and maxillofacial structures were performed using the standard protocol without intravenous contrast. Multiplanar CT image reconstructions of the cervical spine and maxillofacial structures were also generated. COMPARISON:  None. FINDINGS: CT HEAD FINDINGS Brain: There is no mass, hemorrhage or extra-axial collection. The size and configuration of the ventricles and extra-axial CSF spaces are normal. The brain parenchyma is normal, without evidence of acute or chronic infarction. Vascular: No abnormal hyperdensity of the major intracranial arteries or dural venous sinuses. No intracranial atherosclerosis. Skull: Left frontal scalp hematoma with fracture of the anterior table of the left frontal sinus, minimally depressed. CT MAXILLOFACIAL FINDINGS Osseous: Mildly depressed fracture  of the anterior table of the left frontal sinus. Orbits: The globes are intact. Normal appearance of the intra- and extraconal fat. Symmetric extraocular muscles and optic nerves. Sinuses: No fluid levels or advanced mucosal thickening. Soft tissues: Normal visualized extracranial soft tissues. CT CERVICAL SPINE FINDINGS Alignment: No static subluxation. Facets are aligned. Occipital condyles and the lateral masses of C1-C2 are aligned. Skull base and vertebrae: No acute fracture. Soft tissues and spinal canal: No prevertebral fluid or swelling. No visible canal hematoma. Disc levels: No advanced spinal canal or neural foraminal stenosis. Upper chest: No pneumothorax, pulmonary nodule or pleural effusion. Other: Normal visualized paraspinal cervical soft tissues. IMPRESSION: 1. No acute intracranial abnormality. 2. Mildly depressed fracture of the anterior table of the left frontal sinus with associated left frontal scalp  hematoma. 3. No acute fracture or static subluxation of the cervical spine. Electronically Signed   By: Ulyses Jarred M.D.   On: 08/01/2020 00:10   CT Cervical Spine Wo Contrast  Result Date: 08/01/2020 CLINICAL DATA:  Fall EXAM: CT HEAD WITHOUT CONTRAST CT MAXILLOFACIAL WITHOUT CONTRAST CT CERVICAL SPINE WITHOUT CONTRAST TECHNIQUE: Multidetector CT imaging of the head, cervical spine, and maxillofacial structures were performed using the standard protocol without intravenous contrast. Multiplanar CT image reconstructions of the cervical spine and maxillofacial structures were also generated. COMPARISON:  None. FINDINGS: CT HEAD FINDINGS Brain: There is no mass, hemorrhage or extra-axial collection. The size and configuration of the ventricles and extra-axial CSF spaces are normal. The brain parenchyma is normal, without evidence of acute or chronic infarction. Vascular: No abnormal hyperdensity of the major intracranial arteries or dural venous sinuses. No intracranial atherosclerosis. Skull: Left frontal scalp hematoma with fracture of the anterior table of the left frontal sinus, minimally depressed. CT MAXILLOFACIAL FINDINGS Osseous: Mildly depressed fracture of the anterior table of the left frontal sinus. Orbits: The globes are intact. Normal appearance of the intra- and extraconal fat. Symmetric extraocular muscles and optic nerves. Sinuses: No fluid levels or advanced mucosal thickening. Soft tissues: Normal visualized extracranial soft tissues. CT CERVICAL SPINE FINDINGS Alignment: No static subluxation. Facets are aligned. Occipital condyles and the lateral masses of C1-C2 are aligned. Skull base and vertebrae: No acute fracture. Soft tissues and spinal canal: No prevertebral fluid or swelling. No visible canal hematoma. Disc levels: No advanced spinal canal or neural foraminal stenosis. Upper chest: No pneumothorax, pulmonary nodule or pleural effusion. Other: Normal visualized paraspinal cervical soft  tissues. IMPRESSION: 1. No acute intracranial abnormality. 2. Mildly depressed fracture of the anterior table of the left frontal sinus with associated left frontal scalp hematoma. 3. No acute fracture or static subluxation of the cervical spine. Electronically Signed   By: Ulyses Jarred M.D.   On: 08/01/2020 00:10   DG Chest Port 1 View  Result Date: 08/01/2020 CLINICAL DATA:  Chest pain EXAM: PORTABLE CHEST 1 VIEW COMPARISON:  07/22/2020 FINDINGS: The heart size and mediastinal contours are within normal limits. Both lungs are clear. The visualized skeletal structures are unremarkable. IMPRESSION: No active disease. Electronically Signed   By: Ulyses Jarred M.D.   On: 08/01/2020 00:41   DG Shoulder Left Port  Result Date: 08/01/2020 CLINICAL DATA:  Shoulder pain EXAM: LEFT SHOULDER COMPARISON:  None. FINDINGS: There is no evidence of fracture or dislocation. Mild osteoarthrosis of the glenohumeral and acromioclavicular joints. Soft tissues are unremarkable. IMPRESSION: No fracture or dislocation of the left shoulder. Mild osteoarthrosis. Electronically Signed   By: Ulyses Jarred M.D.   On: 08/01/2020 00:44  CT Maxillofacial Wo Contrast  Result Date: 08/01/2020 CLINICAL DATA:  Fall EXAM: CT HEAD WITHOUT CONTRAST CT MAXILLOFACIAL WITHOUT CONTRAST CT CERVICAL SPINE WITHOUT CONTRAST TECHNIQUE: Multidetector CT imaging of the head, cervical spine, and maxillofacial structures were performed using the standard protocol without intravenous contrast. Multiplanar CT image reconstructions of the cervical spine and maxillofacial structures were also generated. COMPARISON:  None. FINDINGS: CT HEAD FINDINGS Brain: There is no mass, hemorrhage or extra-axial collection. The size and configuration of the ventricles and extra-axial CSF spaces are normal. The brain parenchyma is normal, without evidence of acute or chronic infarction. Vascular: No abnormal hyperdensity of the major intracranial arteries or dural venous  sinuses. No intracranial atherosclerosis. Skull: Left frontal scalp hematoma with fracture of the anterior table of the left frontal sinus, minimally depressed. CT MAXILLOFACIAL FINDINGS Osseous: Mildly depressed fracture of the anterior table of the left frontal sinus. Orbits: The globes are intact. Normal appearance of the intra- and extraconal fat. Symmetric extraocular muscles and optic nerves. Sinuses: No fluid levels or advanced mucosal thickening. Soft tissues: Normal visualized extracranial soft tissues. CT CERVICAL SPINE FINDINGS Alignment: No static subluxation. Facets are aligned. Occipital condyles and the lateral masses of C1-C2 are aligned. Skull base and vertebrae: No acute fracture. Soft tissues and spinal canal: No prevertebral fluid or swelling. No visible canal hematoma. Disc levels: No advanced spinal canal or neural foraminal stenosis. Upper chest: No pneumothorax, pulmonary nodule or pleural effusion. Other: Normal visualized paraspinal cervical soft tissues. IMPRESSION: 1. No acute intracranial abnormality. 2. Mildly depressed fracture of the anterior table of the left frontal sinus with associated left frontal scalp hematoma. 3. No acute fracture or static subluxation of the cervical spine. Electronically Signed   By: Ulyses Jarred M.D.   On: 08/01/2020 00:10    Procedures Procedures   Medications Ordered in ED Medications - No data to display  ED Course  I have reviewed the triage vital signs and the nursing notes.  Pertinent labs & imaging results that were available during my care of the patient were reviewed by me and considered in my medical decision making (see chart for details).    MDM Rules/Calculators/A&P                          12:04 AM Patient presents for nursing facility after reportedly falling. Patient is unable to provide any history.  I attempted to call his nursing home but was unable to contact a nurse CT head and C-spine have been ordered.  Patient  also has bruising over his left shoulder, x-rays to be ordered. Reviewed his chart, it does not appear that he is on anticoagulation 1:20 AM CT imaging reveals mildly depressed sinus fracture, but no other acute findings.  Patient with history of advanced dementia and I suspect he is at his baseline.  Secondary survey does not reveal any signs of pelvic or lower extremity trauma.  No signs of any abdominal trauma.  No signs of any thoracic or lumbar trauma. Patient resting comfortably no acute distress.  He will mumble at times during exam but is not able to get any appreciable history. He was just discharged from the hospital.  I feel he is appropriate for discharge back to his nursing home  Final Clinical Impression(s) / ED Diagnoses Final diagnoses:  Injury of head, initial encounter  Closed fracture of frontal sinus, initial encounter ([place])  Strain of neck muscle, initial encounter    Rx /  DC Orders ED Discharge Orders    None       Ripley Fraise, MD 08/01/20 [phone]

## 2020-08-01 NOTE — ED Notes (Signed)
Pt discharged and transported back to [facility] with [facility].

## 2020-08-02 ENCOUNTER — Emergency Department (HOSPITAL_COMMUNITY)
Admission: EM | Admit: 2020-08-02 | Discharge: 2020-08-03 | Disposition: A | Discharge status: Home / Self Care | Payer: Medicare Other | Attending: Emergency Medicine | Admitting: Emergency Medicine

## 2020-08-02 ENCOUNTER — Encounter (HOSPITAL_COMMUNITY): Payer: Self-pay

## 2020-08-02 ENCOUNTER — Other Ambulatory Visit: Payer: Self-pay

## 2020-08-02 DIAGNOSIS — I10 Essential (primary) hypertension: Secondary | ICD-10-CM | POA: Insufficient documentation

## 2020-08-02 DIAGNOSIS — Z8616 Personal history of COVID-19: Secondary | ICD-10-CM | POA: Insufficient documentation

## 2020-08-02 DIAGNOSIS — Z85828 Personal history of other malignant neoplasm of skin: Secondary | ICD-10-CM | POA: Insufficient documentation

## 2020-08-02 DIAGNOSIS — Z515 Encounter for palliative care: Secondary | ICD-10-CM | POA: Diagnosis not present

## 2020-08-02 DIAGNOSIS — Z79899 Other long term (current) drug therapy: Secondary | ICD-10-CM | POA: Insufficient documentation

## 2020-08-02 DIAGNOSIS — E119 Type 2 diabetes mellitus without complications: Secondary | ICD-10-CM | POA: Insufficient documentation

## 2020-08-02 DIAGNOSIS — N3001 Acute cystitis with hematuria: Secondary | ICD-10-CM | POA: Insufficient documentation

## 2020-08-02 DIAGNOSIS — F0391 Unspecified dementia with behavioral disturbance: Secondary | ICD-10-CM | POA: Insufficient documentation

## 2020-08-02 DIAGNOSIS — R456 Violent behavior: Secondary | ICD-10-CM | POA: Insufficient documentation

## 2020-08-02 DIAGNOSIS — Z046 Encounter for general psychiatric examination, requested by authority: Secondary | ICD-10-CM | POA: Diagnosis present

## 2020-08-02 DIAGNOSIS — Z7982 Long term (current) use of aspirin: Secondary | ICD-10-CM | POA: Insufficient documentation

## 2020-08-02 DIAGNOSIS — Z20822 Contact with and (suspected) exposure to covid-19: Secondary | ICD-10-CM | POA: Insufficient documentation

## 2020-08-02 DIAGNOSIS — Z7984 Long term (current) use of oral hypoglycemic drugs: Secondary | ICD-10-CM | POA: Insufficient documentation

## 2020-08-02 DIAGNOSIS — D72829 Elevated white blood cell count, unspecified: Secondary | ICD-10-CM | POA: Insufficient documentation

## 2020-08-02 LAB — CBC WITH DIFFERENTIAL/PLATELET
Abs Immature Granulocytes: 0.06 10*3/uL (ref 0.00–0.07)
Basophils Absolute: 0 10*3/uL (ref 0.0–0.1)
Basophils Relative: 0 %
Eosinophils Absolute: 0.1 10*3/uL (ref 0.0–0.5)
Eosinophils Relative: 0 %
HCT: 37.9 % — ABNORMAL LOW (ref 39.0–52.0)
Hemoglobin: 11.8 g/dL — ABNORMAL LOW (ref 13.0–17.0)
Immature Granulocytes: 0 %
Lymphocytes Relative: 19 %
Lymphs Abs: 2.9 10*3/uL (ref 0.7–4.0)
MCH: 28.6 pg (ref 26.0–34.0)
MCHC: 31.1 g/dL (ref 30.0–36.0)
MCV: 91.8 fL (ref 80.0–100.0)
Monocytes Absolute: 1.2 10*3/uL — ABNORMAL HIGH (ref 0.1–1.0)
Monocytes Relative: 8 %
Neutro Abs: 11 10*3/uL — ABNORMAL HIGH (ref 1.7–7.7)
Neutrophils Relative %: 73 %
Platelets: 316 10*3/uL (ref 150–400)
RBC: 4.13 MIL/uL — ABNORMAL LOW (ref 4.22–5.81)
RDW: 14.3 % (ref 11.5–15.5)
WBC: 15.2 10*3/uL — ABNORMAL HIGH (ref 4.0–10.5)
nRBC: 0 % (ref 0.0–0.2)

## 2020-08-02 LAB — COMPREHENSIVE METABOLIC PANEL
ALT: 41 U/L (ref 0–44)
AST: 49 U/L — ABNORMAL HIGH (ref 15–41)
Albumin: 3.3 g/dL — ABNORMAL LOW (ref 3.5–5.0)
Alkaline Phosphatase: 51 U/L (ref 38–126)
Anion gap: 9 (ref 5–15)
BUN: 37 mg/dL — ABNORMAL HIGH (ref 8–23)
CO2: 23 mmol/L (ref 22–32)
Calcium: 9.3 mg/dL (ref 8.9–10.3)
Chloride: 105 mmol/L (ref 98–111)
Creatinine, Ser: 1.49 mg/dL — ABNORMAL HIGH (ref 0.61–1.24)
GFR, Estimated: 47 mL/min — ABNORMAL LOW (ref >60)
Glucose, Bld: 210 mg/dL — ABNORMAL HIGH (ref 70–99)
Potassium: 4.4 mmol/L (ref 3.5–5.1)
Sodium: 137 mmol/L (ref 135–145)
Total Bilirubin: 0.6 mg/dL (ref 0.3–1.2)
Total Protein: 6 g/dL — ABNORMAL LOW (ref 6.5–8.1)

## 2020-08-02 LAB — ETHANOL: Alcohol, Ethyl (B): <10 mg/dL (ref <10)

## 2020-08-02 LAB — URINALYSIS, ROUTINE W REFLEX MICROSCOPIC
Bilirubin Urine: NEGATIVE
Glucose, UA: 150 mg/dL — AB
Ketones, ur: NEGATIVE mg/dL
Nitrite: NEGATIVE
Protein, ur: 30 mg/dL — AB
RBC / HPF: >50 RBC/hpf — ABNORMAL HIGH (ref 0–5)
Specific Gravity, Urine: 1.027 (ref 1.005–1.030)
pH: 5 (ref 5.0–8.0)

## 2020-08-02 LAB — RAPID URINE DRUG SCREEN, HOSP PERFORMED
Amphetamines: NOT DETECTED
Barbiturates: NOT DETECTED
Benzodiazepines: POSITIVE — AB
Cocaine: NOT DETECTED
Opiates: NOT DETECTED
Tetrahydrocannabinol: NOT DETECTED

## 2020-08-02 LAB — RESP PANEL BY RT-PCR (FLU A&B, COVID) ARPGX2
Influenza A by PCR: NEGATIVE
Influenza B by PCR: NEGATIVE
SARS Coronavirus 2 by RT PCR: NEGATIVE

## 2020-08-02 LAB — AMMONIA: Ammonia: 19 umol/L (ref 9–35)

## 2020-08-02 MED ORDER — QUETIAPINE FUMARATE 50 MG PO TABS
50.0000 mg | ORAL_TABLET | Freq: Every day | ORAL | Status: DC
  Administered 2020-08-02 – 2020-08-03 (×2): 50 mg via ORAL
  Filled 2020-08-02 (×2): qty 1

## 2020-08-02 MED ORDER — PANTOPRAZOLE SODIUM 40 MG PO TBEC
40.0000 mg | DELAYED_RELEASE_TABLET | Freq: Every day | ORAL | Status: DC
  Administered 2020-08-02 – 2020-08-03 (×2): 40 mg via ORAL
  Filled 2020-08-02 (×2): qty 1

## 2020-08-02 MED ORDER — METFORMIN HCL 500 MG PO TABS
1000.0000 mg | ORAL_TABLET | Freq: Two times a day (BID) | ORAL | Status: DC
  Administered 2020-08-02: 1000 mg via ORAL
  Filled 2020-08-02: qty 2

## 2020-08-02 MED ORDER — HYDRALAZINE HCL 50 MG PO TABS
100.0000 mg | ORAL_TABLET | Freq: Three times a day (TID) | ORAL | Status: DC
  Administered 2020-08-02 – 2020-08-03 (×4): 100 mg via ORAL
  Filled 2020-08-02 (×5): qty 2

## 2020-08-02 MED ORDER — SODIUM CHLORIDE 0.9 % IV BOLUS: 1000.0000 mL | Freq: Once | INTRAVENOUS | Status: DC

## 2020-08-02 MED ORDER — CEFTRIAXONE SODIUM 1 G IJ SOLR
1.0000 g | Freq: Once | INTRAMUSCULAR | Status: AC
  Administered 2020-08-02: 1 g via INTRAMUSCULAR
  Filled 2020-08-02: qty 10

## 2020-08-02 MED ORDER — LIDOCAINE HCL (PF) 1 % IJ SOLN
INTRAMUSCULAR | Status: AC
  Administered 2020-08-02: 2.1 mL
  Filled 2020-08-02: qty 5

## 2020-08-02 MED ORDER — ESCITALOPRAM OXALATE 10 MG PO TABS
10.0000 mg | ORAL_TABLET | Freq: Every day | ORAL | Status: DC
  Administered 2020-08-02 – 2020-08-03 (×2): 10 mg via ORAL
  Filled 2020-08-02 (×2): qty 1

## 2020-08-02 MED ORDER — STERILE WATER FOR INJECTION IJ SOLN
INTRAMUSCULAR | Status: AC
  Filled 2020-08-02: qty 10

## 2020-08-02 MED ORDER — CARVEDILOL 3.125 MG PO TABS
6.2500 mg | ORAL_TABLET | Freq: Two times a day (BID) | ORAL | Status: DC
  Administered 2020-08-03 (×2): 6.25 mg via ORAL
  Filled 2020-08-02 (×2): qty 1

## 2020-08-02 MED ORDER — SODIUM CHLORIDE 0.9 % IV SOLN: 1.0000 g | Freq: Once | INTRAVENOUS | Status: DC

## 2020-08-02 MED ORDER — ZIPRASIDONE MESYLATE 20 MG IM SOLR
10.0000 mg | Freq: Once | INTRAMUSCULAR | Status: AC
  Administered 2020-08-02: 10 mg via INTRAMUSCULAR
  Filled 2020-08-02: qty 20

## 2020-08-02 MED ORDER — CEPHALEXIN 250 MG PO CAPS
500.0000 mg | ORAL_CAPSULE | Freq: Three times a day (TID) | ORAL | Status: DC
  Administered 2020-08-02 – 2020-08-03 (×4): 500 mg via ORAL
  Filled 2020-08-02 (×4): qty 2

## 2020-08-02 MED ORDER — METFORMIN HCL 500 MG PO TABS
1000.0000 mg | ORAL_TABLET | Freq: Two times a day (BID) | ORAL | Status: DC
  Administered 2020-08-03 (×2): 1000 mg via ORAL
  Filled 2020-08-02 (×2): qty 2

## 2020-08-02 NOTE — ED Notes (Signed)
1 bag to locker 3
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
## 2020-08-02 NOTE — ED Notes (Signed)
Pt agitated, keeps getting out of bed, putting feces on floor and stretcher, no redirectable; dr Almyra Free notified

## 2020-08-02 NOTE — Care Management (Signed)
ED RNCM received consult for Palliative Care Services, patient is presently IVC'd due to behavior disturbances. Patient is currently at [facility]. TTS pending.

## 2020-08-02 NOTE — BH Assessment (Signed)
TTS clinician attempted to complete assessment. Monique, RN, patient not oriented to complete assessment. TTS will attempt at later time.
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
## 2020-08-03 DIAGNOSIS — Z515 Encounter for palliative care: Secondary | ICD-10-CM | POA: Diagnosis not present

## 2020-08-03 LAB — CBG MONITORING, ED
Glucose-Capillary: 149 mg/dL — ABNORMAL HIGH (ref 70–99)
Glucose-Capillary: 212 mg/dL — ABNORMAL HIGH (ref 70–99)

## 2020-08-03 MED ORDER — CEPHALEXIN 500 MG PO CAPS: 1000.0000 mg | ORAL_CAPSULE | Freq: Two times a day (BID) | ORAL | 0 refills | Status: DC

## 2020-08-03 NOTE — ED Notes (Signed)
Patient returned to ED via [place].  Ceasar Mons, [facility] and ED director notified of patient's return.

## 2020-08-03 NOTE — ED Notes (Signed)
Patrick Phillips pt's daughter notified as requested about pt leaving the unit.

## 2020-08-03 NOTE — ED Notes (Signed)
AC notified of patient's return to ED.  AC to speak with [facility] staff to speak with DON.

## 2020-08-03 NOTE — BHH Counselor (Signed)
This counselor attempted to complete CCA with patient, however, he was sleeping and unable to be woken up to participate. Per chart, patient has not been able to give significant history.  This counselor contacted patient's daughter, [name], for collateral information at [phone]. She states that her father has dementia and delirium related to a UTI. She states he does not have any history of mental illness, psychiatric hospitalizations, or suicide attempts. She acknowledges patient becomes combative at times but believes this is related to worsening dementia. This counselor discussed the option of geri-psych with her and she does not feel that is needed at this time. Plan currently for patient to return to [facility].  Per [name], NP patient is psych cleared. RN notified.

## 2020-08-03 NOTE — Social Work (Signed)
This CSW reached CSW [place] who states it was DON Miss Inez Catalina with whom she spoke as charted at 10:38 and discussed the discharge of Pt.

## 2020-08-03 NOTE — ED Notes (Signed)
Attempted to call [facility], call answered and hung up immediately.

## 2020-08-03 NOTE — BHH Counselor (Signed)
TTS attempted to see patient. Per RN patient is "very confused and sleepy." Cart placed in room and NT attempted to wake up patient without success. This counselor will contact patient's family to gather information for assessment.

## 2020-08-03 NOTE — ED Notes (Signed)
Patient returning from [facility] via [place].  [facility] staff refusing to take patient after accepting patient back this afternoon.

## 2020-08-03 NOTE — ED Notes (Signed)
Report given to Tawni Pummel, RN.

## 2020-08-03 NOTE — Discharge Planning (Signed)
Daughter, Patrick Phillips called regarding palliative care vs hospice vs returning to [facility] for memory care.  Patrick Phillips highly upset as she is getting mixed signals and is not sure which way to go with her dad.  Patrick Phillips and family are very involved and want what is best for dad.  RNCM apologized for any confusion caused in referral for palliative  care and explained that the family has final say and we will honor their wishes.  Patrick Phillips requests that she be called when dad is returning to [facility] so that she can meet him there.  Patrick Phillips looks forward to conversation with Hospice and [place] so she and her family can make an informed decision.

## 2020-08-03 NOTE — BH Assessment (Signed)
TTS secure chatted Nurse to check patient's status to completed consult no response

## 2020-08-03 NOTE — Discharge Planning (Signed)
RNCM received call from [facility] of [place] me that the family is not interested in hospice or palliative care services at this time.

## 2020-08-03 NOTE — Progress Notes (Signed)
Per Roseanne Kaufman, Admissions Director, at [facility], [place] will be returning to facility this evening.  RN on duty is Printmaker. # for report is [phone].  CSW called daughter [name] and updated her to the plan.

## 2020-08-03 NOTE — Progress Notes (Addendum)
CSW called daughter [name] @ [phone] to inform her of the issue with discharge and [facility]'s refusal to accept Pt for return.  CSW informed daughter that Pt is currently safe and secure in the [facility]. CSW informed daughter that [facility] continues to work towards resolution of issue with the hope that Pt will be transferred out to [facility] and will update as more is known. Misty will update the rest of the family.

## 2020-08-03 NOTE — Discharge Instructions (Addendum)
1.  Continue Keflex for urinary tract infection. 2.  Recheck by facility physician for ongoing medical care for comfort care.

## 2020-08-03 NOTE — Discharge Planning (Signed)
RNCM spoke with daughter, [name] regarding Hospice and Palliative Care for her dad.  Daughter admits that she and the family are confused as to the differences and what stage her dad is in.  She is interested in learning more from [place] before committing.  RNCM reached out to [facility] with [facility] with referral.

## 2020-08-03 NOTE — Progress Notes (Signed)
CSW spoke with [facility] and let them know when patient is ready for discharge that [facility] will transport him back. CSW was asked if that would be today. CSW stated it is a possibility.

## 2020-08-03 NOTE — ED Provider Notes (Signed)
Emergency Medicine Observation Re-evaluation Note  Holston Oyama is a 80 y.o. male, seen on rounds today.  Pt initially presented to the ED for complaints of IVC Currently, the patient is sleeping quietly.  He has been cleared to return to nursing home care.  Physical Exam  BP 140/72   Pulse 70   Temp 98.3 F (36.8 C)   Resp 18   SpO2 100%  Physical Exam General: Sleeping no respiratory distress Cardiac: Regular Lungs: No respiratory distress Psych: [place]  ED Course / MDM  EKG:   I have reviewed the labs performed to date as well as medications administered while in observation.  Recent changes in the last 24 hours include none.  Plan  Current plan is for return to nursing home.  IVC is rescinded.  Patient has findings consistent with UTI and behavioral disturbance due to combination of dementia, delirium and medical illness.  Patient's family members have expressed agreement with returning patient to nursing home facility for care geared towards comfort and palliation.  Plan for return to SNF.  Prescription for Keflex for 5 more days written.    Charlesetta Shanks, MD 08/03/20 1505

## 2020-08-03 NOTE — ED Notes (Signed)
[facility] notifying CNO and ED director at this time.

## 2020-08-03 NOTE — BHH Counselor (Signed)
Per [name], NP patient is psych cleared. RN notified.
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
## 2020-08-03 NOTE — ED Notes (Addendum)
Daughter called for update

## 2020-08-03 NOTE — ED Notes (Signed)
[facility] facility notified  By case Manager about pt returning back with prescription for abx for UTI.

## 2020-08-03 NOTE — ED Notes (Signed)
Placed call to [facility], asked to speak with DON at [facility].  Was placed on hold and then call was disconnected.

## 2020-08-09 ENCOUNTER — Emergency Department (HOSPITAL_COMMUNITY): Payer: Medicare Other

## 2020-08-09 ENCOUNTER — Encounter (HOSPITAL_COMMUNITY): Payer: Self-pay

## 2020-08-09 ENCOUNTER — Inpatient Hospital Stay (HOSPITAL_COMMUNITY)
Admission: EM | Admit: 2020-08-09 | Discharge: 2020-08-12 | DRG: 083 | Disposition: A | Discharge status: Hospice | Payer: Medicare Other | Source: Skilled Nursing Facility | Attending: Surgery | Admitting: Surgery

## 2020-08-09 ENCOUNTER — Inpatient Hospital Stay (HOSPITAL_COMMUNITY): Payer: Medicare Other

## 2020-08-09 DIAGNOSIS — W19XXXA Unspecified fall, initial encounter: Secondary | ICD-10-CM | POA: Diagnosis not present

## 2020-08-09 DIAGNOSIS — T83511A Infection and inflammatory reaction due to indwelling urethral catheter, initial encounter: Secondary | ICD-10-CM

## 2020-08-09 DIAGNOSIS — Z7982 Long term (current) use of aspirin: Secondary | ICD-10-CM

## 2020-08-09 DIAGNOSIS — Z66 Do not resuscitate: Secondary | ICD-10-CM | POA: Diagnosis present

## 2020-08-09 DIAGNOSIS — Z515 Encounter for palliative care: Secondary | ICD-10-CM | POA: Diagnosis not present

## 2020-08-09 DIAGNOSIS — S12300A Unspecified displaced fracture of fourth cervical vertebra, initial encounter for closed fracture: Secondary | ICD-10-CM | POA: Diagnosis present

## 2020-08-09 DIAGNOSIS — S14109A Unspecified injury at unspecified level of cervical spinal cord, initial encounter: Secondary | ICD-10-CM | POA: Diagnosis not present

## 2020-08-09 DIAGNOSIS — F0150 Vascular dementia without behavioral disturbance: Secondary | ICD-10-CM | POA: Diagnosis present

## 2020-08-09 DIAGNOSIS — Z8616 Personal history of COVID-19: Secondary | ICD-10-CM | POA: Diagnosis not present

## 2020-08-09 DIAGNOSIS — W1839XA Other fall on same level, initial encounter: Secondary | ICD-10-CM | POA: Diagnosis present

## 2020-08-09 DIAGNOSIS — F0151 Vascular dementia with behavioral disturbance: Secondary | ICD-10-CM

## 2020-08-09 DIAGNOSIS — S0285XD Fracture of orbit, unspecified, subsequent encounter for fracture with routine healing: Secondary | ICD-10-CM | POA: Diagnosis not present

## 2020-08-09 DIAGNOSIS — Z7189 Other specified counseling: Secondary | ICD-10-CM

## 2020-08-09 DIAGNOSIS — Y846 Urinary catheterization as the cause of abnormal reaction of the patient, or of later complication, without mention of misadventure at the time of the procedure: Secondary | ICD-10-CM | POA: Diagnosis present

## 2020-08-09 DIAGNOSIS — S065X9A Traumatic subdural hemorrhage with loss of consciousness of unspecified duration, initial encounter: Principal | ICD-10-CM

## 2020-08-09 DIAGNOSIS — N390 Urinary tract infection, site not specified: Secondary | ICD-10-CM | POA: Diagnosis not present

## 2020-08-09 DIAGNOSIS — R338 Other retention of urine: Secondary | ICD-10-CM | POA: Diagnosis present

## 2020-08-09 DIAGNOSIS — G9341 Metabolic encephalopathy: Secondary | ICD-10-CM | POA: Diagnosis not present

## 2020-08-09 DIAGNOSIS — I10 Essential (primary) hypertension: Secondary | ICD-10-CM | POA: Diagnosis present

## 2020-08-09 DIAGNOSIS — S0231XA Fracture of orbital floor, right side, initial encounter for closed fracture: Secondary | ICD-10-CM | POA: Diagnosis present

## 2020-08-09 DIAGNOSIS — S065XAA Traumatic subdural hemorrhage with loss of consciousness status unknown, initial encounter: Secondary | ICD-10-CM | POA: Diagnosis present

## 2020-08-09 DIAGNOSIS — Y92129 Unspecified place in nursing home as the place of occurrence of the external cause: Secondary | ICD-10-CM

## 2020-08-09 DIAGNOSIS — Z7984 Long term (current) use of oral hypoglycemic drugs: Secondary | ICD-10-CM | POA: Diagnosis not present

## 2020-08-09 DIAGNOSIS — Z79899 Other long term (current) drug therapy: Secondary | ICD-10-CM

## 2020-08-09 DIAGNOSIS — S0081XA Abrasion of other part of head, initial encounter: Secondary | ICD-10-CM | POA: Diagnosis present

## 2020-08-09 DIAGNOSIS — S0285XA Fracture of orbit, unspecified, initial encounter for closed fracture: Secondary | ICD-10-CM | POA: Diagnosis not present

## 2020-08-09 DIAGNOSIS — E119 Type 2 diabetes mellitus without complications: Secondary | ICD-10-CM

## 2020-08-09 DIAGNOSIS — T83518A Infection and inflammatory reaction due to other urinary catheter, initial encounter: Secondary | ICD-10-CM | POA: Diagnosis present

## 2020-08-09 DIAGNOSIS — Z85828 Personal history of other malignant neoplasm of skin: Secondary | ICD-10-CM

## 2020-08-09 DIAGNOSIS — S0240CA Maxillary fracture, right side, initial encounter for closed fracture: Secondary | ICD-10-CM | POA: Diagnosis present

## 2020-08-09 DIAGNOSIS — L8990 Pressure ulcer of unspecified site, unspecified stage: Secondary | ICD-10-CM | POA: Insufficient documentation

## 2020-08-09 DIAGNOSIS — F03918 Unspecified dementia, unspecified severity, with other behavioral disturbance: Secondary | ICD-10-CM | POA: Diagnosis present

## 2020-08-09 DIAGNOSIS — N3000 Acute cystitis without hematuria: Secondary | ICD-10-CM

## 2020-08-09 DIAGNOSIS — S0292XA Unspecified fracture of facial bones, initial encounter for closed fracture: Secondary | ICD-10-CM

## 2020-08-09 LAB — COMPREHENSIVE METABOLIC PANEL
ALT: 28 U/L (ref 0–44)
AST: 28 U/L (ref 15–41)
Albumin: 2.9 g/dL — ABNORMAL LOW (ref 3.5–5.0)
Alkaline Phosphatase: 43 U/L (ref 38–126)
Anion gap: 9 (ref 5–15)
BUN: 25 mg/dL — ABNORMAL HIGH (ref 8–23)
CO2: 26 mmol/L (ref 22–32)
Calcium: 9 mg/dL (ref 8.9–10.3)
Chloride: 99 mmol/L (ref 98–111)
Creatinine, Ser: 0.96 mg/dL (ref 0.61–1.24)
GFR, Estimated: >60 mL/min (ref >60)
Glucose, Bld: 169 mg/dL — ABNORMAL HIGH (ref 70–99)
Potassium: 4 mmol/L (ref 3.5–5.1)
Sodium: 134 mmol/L — ABNORMAL LOW (ref 135–145)
Total Bilirubin: 0.8 mg/dL (ref 0.3–1.2)
Total Protein: 5.5 g/dL — ABNORMAL LOW (ref 6.5–8.1)

## 2020-08-09 LAB — PROTIME-INR
INR: 1 (ref 0.8–1.2)
Prothrombin Time: 13.7 seconds (ref 11.4–15.2)

## 2020-08-09 LAB — CBC WITH DIFFERENTIAL/PLATELET
Abs Immature Granulocytes: 0.05 10*3/uL (ref 0.00–0.07)
Basophils Absolute: 0 10*3/uL (ref 0.0–0.1)
Basophils Relative: 0 %
Eosinophils Absolute: 0 10*3/uL (ref 0.0–0.5)
Eosinophils Relative: 0 %
HCT: 33.4 % — ABNORMAL LOW (ref 39.0–52.0)
Hemoglobin: 10.9 g/dL — ABNORMAL LOW (ref 13.0–17.0)
Immature Granulocytes: 0 %
Lymphocytes Relative: 11 %
Lymphs Abs: 1.3 10*3/uL (ref 0.7–4.0)
MCH: 29 pg (ref 26.0–34.0)
MCHC: 32.6 g/dL (ref 30.0–36.0)
MCV: 88.8 fL (ref 80.0–100.0)
Monocytes Absolute: 0.8 10*3/uL (ref 0.1–1.0)
Monocytes Relative: 7 %
Neutro Abs: 9.5 10*3/uL — ABNORMAL HIGH (ref 1.7–7.7)
Neutrophils Relative %: 82 %
Platelets: 302 10*3/uL (ref 150–400)
RBC: 3.76 MIL/uL — ABNORMAL LOW (ref 4.22–5.81)
RDW: 13.6 % (ref 11.5–15.5)
WBC: 11.7 10*3/uL — ABNORMAL HIGH (ref 4.0–10.5)
nRBC: 0 % (ref 0.0–0.2)

## 2020-08-09 LAB — AMMONIA: Ammonia: 24 umol/L (ref 9–35)

## 2020-08-09 LAB — URINALYSIS, ROUTINE W REFLEX MICROSCOPIC
Bilirubin Urine: NEGATIVE
Glucose, UA: NEGATIVE mg/dL
Ketones, ur: 5 mg/dL — AB
Nitrite: NEGATIVE
Protein, ur: 30 mg/dL — AB
Specific Gravity, Urine: 1.018 (ref 1.005–1.030)
WBC, UA: >50 WBC/hpf — ABNORMAL HIGH (ref 0–5)
pH: 5 (ref 5.0–8.0)

## 2020-08-09 MED ORDER — LORAZEPAM 2 MG/ML IJ SOLN
1.0000 mg | Freq: Once | INTRAMUSCULAR | Status: AC
  Administered 2020-08-09: 1 mg via INTRAVENOUS
  Filled 2020-08-09: qty 1

## 2020-08-09 MED ORDER — SODIUM CHLORIDE 0.9 % IV SOLN
1.0000 g | Freq: Once | INTRAVENOUS | Status: AC
  Administered 2020-08-09: 1 g via INTRAVENOUS
  Filled 2020-08-09: qty 10

## 2020-08-09 MED ORDER — ONDANSETRON HCL 4 MG/2ML IJ SOLN: 4.0000 mg | Freq: Four times a day (QID) | INTRAMUSCULAR | Status: DC | PRN

## 2020-08-09 MED ORDER — INSULIN ASPART 100 UNIT/ML IJ SOLN
0.0000 [IU] | INTRAMUSCULAR | Status: DC
  Administered 2020-08-10: 1 [IU] via SUBCUTANEOUS

## 2020-08-09 MED ORDER — ZIPRASIDONE MESYLATE 20 MG IM SOLR
10.0000 mg | Freq: Once | INTRAMUSCULAR | Status: AC
  Administered 2020-08-09: 10 mg via INTRAMUSCULAR
  Filled 2020-08-09: qty 20

## 2020-08-09 MED ORDER — STERILE WATER FOR INJECTION IJ SOLN
INTRAMUSCULAR | Status: AC
  Administered 2020-08-09: 10 mL
  Filled 2020-08-09: qty 10

## 2020-08-09 MED ORDER — QUETIAPINE FUMARATE 25 MG PO TABS
50.0000 mg | ORAL_TABLET | Freq: Every day | ORAL | Status: DC
Start: 2020-08-09 — End: 2020-08-10
  Filled 2020-08-09: qty 1

## 2020-08-09 MED ORDER — SODIUM CHLORIDE 0.9 % IV SOLN: 1.0000 g | INTRAVENOUS | Status: DC

## 2020-08-09 MED ORDER — HALOPERIDOL LACTATE 5 MG/ML IJ SOLN
2.0000 mg | Freq: Four times a day (QID) | INTRAMUSCULAR | Status: DC | PRN
  Administered 2020-08-10: 2 mg via INTRAVENOUS
  Filled 2020-08-09: qty 1

## 2020-08-09 MED ORDER — IOHEXOL 300 MG/ML  SOLN
100.0000 mL | Freq: Once | INTRAMUSCULAR | Status: AC | PRN
  Administered 2020-08-09: 100 mL via INTRAVENOUS

## 2020-08-09 MED ORDER — TRAMADOL HCL 50 MG PO TABS: 50.0000 mg | ORAL_TABLET | Freq: Four times a day (QID) | ORAL | Status: DC | PRN

## 2020-08-09 MED ORDER — SODIUM CHLORIDE 0.9 % IV SOLN: INTRAVENOUS | Status: DC

## 2020-08-09 MED ORDER — TAMSULOSIN HCL 0.4 MG PO CAPS: 0.4000 mg | ORAL_CAPSULE | Freq: Every day | ORAL | Status: DC

## 2020-08-09 MED ORDER — DOCUSATE SODIUM 100 MG PO CAPS: 100.0000 mg | ORAL_CAPSULE | Freq: Two times a day (BID) | ORAL | Status: DC

## 2020-08-09 MED ORDER — ESCITALOPRAM OXALATE 10 MG PO TABS: 10.0000 mg | ORAL_TABLET | Freq: Every day | ORAL | Status: DC

## 2020-08-09 MED ORDER — SODIUM CHLORIDE 0.9 % IV SOLN
2.0000 g | Freq: Three times a day (TID) | INTRAVENOUS | Status: DC
  Administered 2020-08-10 – 2020-08-11 (×4): 2 g via INTRAVENOUS
  Filled 2020-08-09 (×5): qty 2

## 2020-08-09 MED ORDER — THIAMINE HCL 100 MG PO TABS: 100.0000 mg | ORAL_TABLET | Freq: Every day | ORAL | Status: DC

## 2020-08-09 MED ORDER — HYDRALAZINE HCL 25 MG PO TABS: 100.0000 mg | ORAL_TABLET | Freq: Three times a day (TID) | ORAL | Status: DC

## 2020-08-09 MED ORDER — PANTOPRAZOLE SODIUM 40 MG PO TBEC: 40.0000 mg | DELAYED_RELEASE_TABLET | Freq: Every day | ORAL | Status: DC

## 2020-08-09 MED ORDER — ALUM & MAG HYDROXIDE-SIMETH 200-200-20 MG/5ML PO SUSP: 30.0000 mL | Freq: Four times a day (QID) | ORAL | Status: DC | PRN

## 2020-08-09 MED ORDER — LORAZEPAM 2 MG/ML IJ SOLN
1.0000 mg | INTRAMUSCULAR | Status: DC | PRN
  Administered 2020-08-10 (×2): 1 mg via INTRAMUSCULAR
  Filled 2020-08-09 (×2): qty 1

## 2020-08-09 MED ORDER — CARVEDILOL 3.125 MG PO TABS: 6.2500 mg | ORAL_TABLET | Freq: Two times a day (BID) | ORAL | Status: DC

## 2020-08-09 MED ORDER — ONDANSETRON 4 MG PO TBDP: 4.0000 mg | ORAL_TABLET | Freq: Four times a day (QID) | ORAL | Status: DC | PRN

## 2020-08-09 MED ORDER — HYDRALAZINE HCL 20 MG/ML IJ SOLN
10.0000 mg | Freq: Once | INTRAMUSCULAR | Status: AC
  Administered 2020-08-09: 10 mg via INTRAVENOUS
  Filled 2020-08-09: qty 1

## 2020-08-09 NOTE — Social Work (Signed)
At request of family, CSW met with Pt and daughter, [name], at bedside.   Per [place], Pt's current placement, [facility] will be giving a 30 day discharge.  Family would like to schedule a face-to-face meeting with [facility] team who will be working with Pt on placement once Pt is admitted to hospital. Family wishes to be a proactive as possible with placement process.

## 2020-08-09 NOTE — ED Provider Notes (Signed)
4:09 PM Care assumed from Dr. Rogene Houston.  At time of transfer care, patient is waiting for MRI of the neck and CT of the face to look for further injuries after a recurrent fall today.  According to previous team and family, patient has had multiple falls recently and had another 1 today in the setting of altered mental status.  Daughter does report that he has had recent urinary tract infections as well and was concerned that something else could be going on because of mental status changes seem to happen when he has infections.  Previous team found that he does have an acute subdural, some  Positioning changes in his C-spine that neurosurgery wants MRI to further assess, a C4 fracture, and possible facial fractures.  CT of the face is also ordered to further evaluate.  Urinalysis returned showing evidence of urinary tract infection with leukocytes and bacteria.  Will treat with antibiotics and due to his acute delirium likely from the UTI leading to multiple falls and intracranial bleeding, do not feel patient is appropriate for discharge home.  Other labs ordered including TSH and INR.  Anticipate admission after work-up is completed.   9:08 PM MRI returned showing possible ligamentous injury and possible hemorrhage in the neck as well.  Neurosurgery will look at the images and leave official recommendations with they agree with admission for acute delirium in the setting of fall, subdural, and UTI.  They request to remain in the c-collar until they can see him and make an official plan.  9:36 PM Spoke with Dr. Alcario Drought with hospitalist.  Initially plan was to admit to hospitalist however he is officially informally declining this patient for admission.  He will write a consult note in regards to the UTI management and delirium management but due to traumatic injuries in the head with the subdural, neck injury, and now facial injury on facial CT, he feels that trauma would be the most appropriate  place for this patient.  Will call trauma for admission.  10:08 PM Spoke with will see the patient in the morning.   Dr. Conley Simmonds with ENT facial trauma team who will see the patient in the morning.  He had no further recommendations at this time.  He will see the patient in the morning.  Patient will be admitted to trauma.   Clinical Impression: 1. Fall, initial encounter   2. Subdural hematoma (HCC)   3. Acute cystitis without hematuria   4. Multiple facial fractures, closed, initial encounter [facility])     Disposition: Admit  This note was prepared with assistance of Dragon voice recognition software. Occasional wrong-word or sound-a-like substitutions may have occurred due to the inherent limitations of voice recognition software.    CRITICAL CARE Performed by: Gwenyth Allegra Havah Ammon Total critical care time: 35 minutes Critical care time was exclusive of separately billable procedures and treating other patients. Critical care was necessary to treat or prevent imminent or life-threatening deterioration. Critical care was time spent personally by me on the following activities: development of treatment plan with patient and/or surrogate as well as nursing, discussions with consultants, evaluation of patient's response to treatment, examination of patient, obtaining history from patient or surrogate, ordering and performing treatments and interventions, ordering and review of laboratory studies, ordering and review of radiographic studies, pulse oximetry and re-evaluation of patient's condition.            Demita Tobia, Gwenyth Allegra, MD 08/09/20 2217

## 2020-08-09 NOTE — ED Provider Notes (Signed)
[facility] EMERGENCY DEPARTMENT Provider Note   CSN: [id number] Arrival date & time: 08/09/20  6503     History Chief Complaint  Patient presents with   Patrick Phillips    Patrick Phillips is a 80 y.o. male.  Patient brought in from [facility].  Patient apparently got up and fell and hit his head.  Patient not on blood thinners.  They reported no loss of consciousness.  Blood pressure was 164/90.  Heart rate 94.  Sats were 94% on room air.  Blood sugar was 216 patient has a history of dementia diabetes multiple falls hypertension and memory deficit problems.  Patient was seen June 4 for fall and head injury.  With finding of facial fractures.  He was also evaluated June 6 for combative aggressive behavior.  There was some question about urinary tract infection at that time.  Patient was treated with Rocephin IM.  Patient very somnolent here this morning.  Apparently was having aggressive behavior during the night.  Do not know if he was oversedated.  But patient not very responsive breathing okay.  Will open eyes.      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[place]) 06/11/2020   Acute ischemic stroke ([place]) 07/24/2017   Chest pain 12/22/2015   Chronic back pain 12/22/2015   Syncope 10/17/2013   Sinus bradycardia 10/17/2013   SOB (shortness of breath) 10/17/2013   Hypertension    Type 2 diabetes mellitus ([place])     Past Surgical History:  Procedure Laterality Date   BACK SURGERY      HERNIA REPAIR     LOOP RECORDER INSERTION N/A 07/26/2017   Procedure: LOOP RECORDER INSERTION;  Surgeon: Evans Lance, MD;  Location: [facility];  Service: Cardiovascular;  Laterality: N/A;   TEE WITHOUT CARDIOVERSION N/A 07/26/2017   Procedure: TRANSESOPHAGEAL ECHOCARDIOGRAM (TEE);  Surgeon: Fay Records, MD;  Location: [facility] ENDOSCOPY;  Service: Cardiovascular;  Laterality: N/A;   TRIGGER FINGER RELEASE Right 12/02/2015   Procedure: RIGHT THUMB RELEASE TRIGGER FINGER/A-1 PULLEY;  Surgeon: Leanora Cover, MD;  Location: [facility];  Service: Orthopedics;  Laterality: Right;       Family History  Problem Relation Age of Onset   Cancer Mother    Cancer Father    Cancer Brother    Cancer Other     Social History   Tobacco Use   Smoking status: Never   Smokeless tobacco: Never  Vaping Use   Vaping Use: Never used  Substance Use Topics   Alcohol use: Yes    Comment: occ   Drug use: No    Home Medications Prior to Admission medications   Medication Sig Start Date End Date Taking? Authorizing Provider  acetaminophen (TYLENOL) 500 MG tablet Take 500 mg by mouth every 6 (six) hours as needed for headache, fever or mild pain.   Yes [provider]  Alum & Mag Hydroxide-Simeth (MAALOX PLUS EXTRA STRENGTH PO) Take  30 mLs by mouth every 6 (six) hours as needed (for heartburn or indigestion).   Yes [provider]  alum & mag hydroxide-simeth (MAALOX/MYLANTA) 200-200-20 MG/5ML suspension Take 30 mLs by mouth every 6 (six) hours as needed for heartburn.   Yes [provider]  aspirin EC 81 MG tablet Take 1 tablet (81 mg total) by mouth daily. 07/14/20  Yes Ghimire, Henreitta Leber, MD  carvedilol (COREG) 6.25 MG tablet Take 1 tablet (6.25 mg total) by mouth 2 (two) times daily with a meal. 07/14/20  Yes Ghimire, Henreitta Leber, MD  cephALEXin (KEFLEX) 500 MG capsule Take 2 capsules (1,000 mg total) by mouth 2 (two) times daily. Patient taking differently:  Take 500 mg by mouth 2 (two) times daily. 08/03/20  Yes Charlesetta Shanks, MD  escitalopram (LEXAPRO) 10 MG tablet Take 1 tablet (10 mg total) by mouth daily. 07/27/20  Yes Nita Sells, MD  hydrALAZINE (APRESOLINE) 100 MG tablet Take 1 tablet (100 mg total) by mouth every 8 (eight) hours. 07/14/20  Yes Ghimire, Henreitta Leber, MD  LORazepam (ATIVAN) 2 MG/ML injection Inject 1 mg into the muscle every 4 (four) hours as needed for anxiety.   Yes [provider]  metFORMIN (GLUCOPHAGE) 1000 MG tablet Take 1 tablet (1,000 mg total) by mouth 2 (two) times daily. 07/14/20  Yes Ghimire, Henreitta Leber, MD  Multiple Vitamin (MULTIVITAMIN WITH MINERALS) TABS tablet Take 1 tablet by mouth daily. 07/14/20  Yes Ghimire, Henreitta Leber, MD  neomycin-bacitracin-polymyxin (NEOSPORIN) [id number] ointment Apply 1 application topically daily as needed (minor skin tears/abrasions).   Yes [provider]  omeprazole (PRILOSEC) 20 MG capsule Take 20 mg by mouth at bedtime.   Yes [provider]  QUEtiapine (SEROQUEL) 50 MG tablet Take 1 tablet (50 mg total) by mouth daily at 8 pm. 07/14/20  Yes Ghimire, Henreitta Leber, MD  tamsulosin (FLOMAX) 0.4 MG CAPS capsule Take 1 capsule (0.4 mg total) by mouth daily. 07/14/20  Yes Ghimire, Henreitta Leber, MD  thiamine 100 MG tablet Take 1 tablet (100 mg total) by mouth daily. 07/14/20  Yes Ghimire, Henreitta Leber, MD  esomeprazole (NEXIUM) 40 MG capsule Take 1 capsule (40 mg total) by mouth daily. Patient not taking: Reported on 08/09/2020 07/14/20   Jonetta Osgood, MD  feeding supplement (ENSURE ENLIVE / ENSURE PLUS) LIQD Take 237 mLs by mouth 2 (two) times daily between meals. Patient not taking: Reported on 08/09/2020 07/14/20 08/13/20  Jonetta Osgood, MD    Allergies    Aricept [donepezil]  Review of Systems   Review of Systems  Unable to perform ROS: Mental status change  Skin:  Positive for wound.  All other systems reviewed and are negative.  Physical Exam Updated  Vital Signs BP (!) 184/69 (BP Location: Right Arm)   Pulse 65   Temp 97.8 F (36.6 C) (Oral)   Resp 20   SpO2 96%   Physical Exam Vitals and nursing note reviewed.  Constitutional:      Appearance: He is well-developed.     Comments: Very somnolent.  HENT:     Head: Normocephalic.     Comments: Hematoma abrasions to right forehead area.  Bruising around both eyes.  Which appear little bit old.    Mouth/Throat:     Mouth: Mucous membranes are dry.  Eyes:     Conjunctiva/sclera: Conjunctivae normal.     Comments: No evidence of hyphema.  Bruising around both orbits.  Cardiovascular:     Rate and Rhythm: Normal  rate and regular rhythm.     Heart sounds: No murmur heard. Pulmonary:     Effort: Pulmonary effort is normal. No respiratory distress.     Breath sounds: Normal breath sounds.  Abdominal:     Palpations: Abdomen is soft.     Tenderness: There is no abdominal tenderness.  Musculoskeletal:        General: No swelling, tenderness or deformity.     Cervical back: Neck supple.     Comments: No pain with movement of upper extremities or lower extremities.  Scattered bruising to upper extremities seem to be old.  Skin:    General: Skin is warm and dry.  Neurological:     Comments: Patient will open eyes breathing okay but very somnolent.  Seems to spontaneously move all 4 extremities a little bit.  But will not follow commands    ED Results / Procedures / Treatments   Labs (all labs ordered are listed, but only abnormal results are displayed) Labs Reviewed  CBC WITH DIFFERENTIAL/PLATELET - Abnormal; Notable for the following components:      Result Value   WBC 11.7 (*)    RBC 3.76 (*)    Hemoglobin 10.9 (*)    HCT 33.4 (*)    Neutro Abs 9.5 (*)    All other components within normal limits  URINALYSIS, ROUTINE W REFLEX MICROSCOPIC - Abnormal; Notable for the following components:   APPearance TURBID (*)    Hgb urine dipstick MODERATE (*)    Ketones, ur 5 (*)     Protein, ur 30 (*)    Leukocytes,Ua LARGE (*)    WBC, UA >50 (*)    Bacteria, UA RARE (*)    All other components within normal limits  COMPREHENSIVE METABOLIC PANEL - Abnormal; Notable for the following components:   Sodium 134 (*)    Glucose, Bld 169 (*)    BUN 25 (*)    Total Protein 5.5 (*)    Albumin 2.9 (*)    All other components within normal limits  URINE CULTURE  TSH  AMMONIA  PROTIME-INR    EKG EKG Interpretation  Date/Time:  Monday August 09 2020 10:20:50 EDT Ventricular Rate:  61 PR Interval:  176 QRS Duration: 90 QT Interval:  434 QTC Calculation: 436 R Axis:   -21 Text Interpretation: Normal sinus rhythm Septal infarct , age undetermined Abnormal ECG No significant change since last tracing Confirmed by Fredia Sorrow [phone]) on 08/09/2020 11:18:24 AM  Radiology CT Head Wo Contrast  Result Date: 08/09/2020 CLINICAL DATA:  Head trauma, minor. Poly trauma, critical, head/cervical spine injury suspected. Additional history provided: Dizziness, fall, contusion to right forehead. EXAM: CT HEAD WITHOUT CONTRAST CT CERVICAL SPINE WITHOUT CONTRAST TECHNIQUE: Multidetector CT imaging of the head and cervical spine was performed following the standard protocol without intravenous contrast. Multiplanar CT image reconstructions of the cervical spine were also generated. COMPARISON:  CT head/maxillofacial and cervical spine 07/31/2020. FINDINGS: CT HEAD FINDINGS Brain: Mild generalized parenchymal atrophy. Thin acute subdural hematoma overlying the lateral aspect of the right frontal lobe measuring up to 2-3 mm in thickness (for instance as seen on series 5, image 32). No demarcated cortical infarct. No evidence of intracranial mass. No midline shift. Vascular: No hyperdense vessel.  Atherosclerotic calcifications. Skull: Redemonstrated subacute comminuted fracture involving the outer table of the left frontal sinus with extension into the left orbital roof. Sinuses/Orbits: There is  gas within the extraconal right orbit and within the right periorbital soft tissues. The  globes are normal in size and contour. No definite retro bulbar hematoma is identified. Moderate mucosal thickening within the left frontal sinus. Mild mucosal thickening within the right frontal sinus. Mucosal thickening and fluid within the bilateral ethmoid air cells. Mild mucosal thickening and small mucous retention cysts within the bilateral maxillary sinuses. Other: Acute medially displaced fracture of the right lamina papyracea. A minimally depressed acute fracture of the right orbital floor may also be present. Soft tissue gas is present along the posterolateral wall the right maxillary sinus and within the right facial soft tissues. Right anterior scalp/forehead hematoma. CT CERVICAL SPINE FINDINGS Alignment: 2 mm C3-C4 grade 1 retrolisthesis, slightly increased from the prior cervical spine CT of 07/31/2020. Additionally, there is subtle widening at the anterior aspect of the C3-C4 and C4-C5 disc spaces as compared to the prior cervical spine CT. Partially imaged upper thoracic dextrocurvature. Skull base and vertebrae: The basion-dental and atlanto-dental intervals are maintained.Acute, mildly displaced fracture of the C4 spinous process (series 10, image 49). Soft tissues and spinal canal: No prevertebral fluid or swelling. No visible canal hematoma. Disc levels: Cervical spondylosis with multilevel disc space narrowing, central disc protrusions, disc bulges, uncovertebral hypertrophy and facet arthrosis. Multilevel spinal canal stenosis. Most notably, a central disc protrusion contributes to apparent moderate spinal canal stenosis at C3-C4. Multilevel bony neural foraminal narrowing. There is a small amount of gas within the C3 disc space. Upper chest: No consolidation within the imaged lung apices. No visible pneumothorax. These results were called by telephone at the time of interpretation on 08/09/2020 at 11:14  am to provider Fredia Sorrow , who verbally acknowledged these results. IMPRESSION: CT head: 1. Thin acute subdural hematoma overlying the lateral right frontal lobe. 2. Right anterior scalp/forehead hematoma. 3. Acute medially displaced fracture of the right lamina papyracea. A minimally depressed acute fracture of the right orbital floor may also be present. A dedicated maxillofacial CT is recommended for further evaluation. 4. Gas within the extraconal right orbit and within the right periorbital soft tissues. 5. Additional gas along the posterolateral wall the right maxillary sinus and within the right face soft tissues. 6. Redemonstrated subacute comminuted and depressed fracture through the outer table of the left frontal calvarium with extension to the left orbital roof. 7. Paranasal sinus disease, as described. CT cervical spine: 1. Acute, mildly displaced fracture of the C4 spinous process. 2. There is 2 mm C3-C4 grade 1 retrolisthesis, progressed from the recent prior cervical spine CT of 07/31/2020. A small amount of gas is now present within the C3-C4 disc. Additionally, there is subtle widening at the anterior aspect of the C3-C4 and C4-C5 disc spaces as compared to the prior CT. An MRI of the cervical spine is recommended to evaluate for possible ligamentous injury. 3. Cervical spondylosis, as described. Electronically Signed   By: Kellie Simmering DO   On: 08/09/2020 11:21   CT Cervical Spine Wo Contrast  Result Date: 08/09/2020 CLINICAL DATA:  Head trauma, minor. Poly trauma, critical, head/cervical spine injury suspected. Additional history provided: Dizziness, fall, contusion to right forehead. EXAM: CT HEAD WITHOUT CONTRAST CT CERVICAL SPINE WITHOUT CONTRAST TECHNIQUE: Multidetector CT imaging of the head and cervical spine was performed following the standard protocol without intravenous contrast. Multiplanar CT image reconstructions of the cervical spine were also generated. COMPARISON:  CT  head/maxillofacial and cervical spine 07/31/2020. FINDINGS: CT HEAD FINDINGS Brain: Mild generalized parenchymal atrophy. Thin acute subdural hematoma overlying the lateral aspect of the right frontal lobe measuring up  to 2-3 mm in thickness (for instance as seen on series 5, image 32). No demarcated cortical infarct. No evidence of intracranial mass. No midline shift. Vascular: No hyperdense vessel.  Atherosclerotic calcifications. Skull: Redemonstrated subacute comminuted fracture involving the outer table of the left frontal sinus with extension into the left orbital roof. Sinuses/Orbits: There is gas within the extraconal right orbit and within the right periorbital soft tissues. The globes are normal in size and contour. No definite retro bulbar hematoma is identified. Moderate mucosal thickening within the left frontal sinus. Mild mucosal thickening within the right frontal sinus. Mucosal thickening and fluid within the bilateral ethmoid air cells. Mild mucosal thickening and small mucous retention cysts within the bilateral maxillary sinuses. Other: Acute medially displaced fracture of the right lamina papyracea. A minimally depressed acute fracture of the right orbital floor may also be present. Soft tissue gas is present along the posterolateral wall the right maxillary sinus and within the right facial soft tissues. Right anterior scalp/forehead hematoma. CT CERVICAL SPINE FINDINGS Alignment: 2 mm C3-C4 grade 1 retrolisthesis, slightly increased from the prior cervical spine CT of 07/31/2020. Additionally, there is subtle widening at the anterior aspect of the C3-C4 and C4-C5 disc spaces as compared to the prior cervical spine CT. Partially imaged upper thoracic dextrocurvature. Skull base and vertebrae: The basion-dental and atlanto-dental intervals are maintained.Acute, mildly displaced fracture of the C4 spinous process (series 10, image 49). Soft tissues and spinal canal: No prevertebral fluid or  swelling. No visible canal hematoma. Disc levels: Cervical spondylosis with multilevel disc space narrowing, central disc protrusions, disc bulges, uncovertebral hypertrophy and facet arthrosis. Multilevel spinal canal stenosis. Most notably, a central disc protrusion contributes to apparent moderate spinal canal stenosis at C3-C4. Multilevel bony neural foraminal narrowing. There is a small amount of gas within the C3 disc space. Upper chest: No consolidation within the imaged lung apices. No visible pneumothorax. These results were called by telephone at the time of interpretation on 08/09/2020 at 11:14 am to provider Fredia Sorrow , who verbally acknowledged these results. IMPRESSION: CT head: 1. Thin acute subdural hematoma overlying the lateral right frontal lobe. 2. Right anterior scalp/forehead hematoma. 3. Acute medially displaced fracture of the right lamina papyracea. A minimally depressed acute fracture of the right orbital floor may also be present. A dedicated maxillofacial CT is recommended for further evaluation. 4. Gas within the extraconal right orbit and within the right periorbital soft tissues. 5. Additional gas along the posterolateral wall the right maxillary sinus and within the right face soft tissues. 6. Redemonstrated subacute comminuted and depressed fracture through the outer table of the left frontal calvarium with extension to the left orbital roof. 7. Paranasal sinus disease, as described. CT cervical spine: 1. Acute, mildly displaced fracture of the C4 spinous process. 2. There is 2 mm C3-C4 grade 1 retrolisthesis, progressed from the recent prior cervical spine CT of 07/31/2020. A small amount of gas is now present within the C3-C4 disc. Additionally, there is subtle widening at the anterior aspect of the C3-C4 and C4-C5 disc spaces as compared to the prior CT. An MRI of the cervical spine is recommended to evaluate for possible ligamentous injury. 3. Cervical spondylosis, as  described. Electronically Signed   By: Kellie Simmering DO   On: 08/09/2020 11:21    Procedures Procedures   CRITICAL CARE Performed by: Fredia Sorrow Total critical care time: 45 minutes Critical care time was exclusive of separately billable procedures and treating other patients. Critical care  was necessary to treat or prevent imminent or life-threatening deterioration. Critical care was time spent personally by me on the following activities: development of treatment plan with patient and/or surrogate as well as nursing, discussions with consultants, evaluation of patient's response to treatment, examination of patient, obtaining history from patient or surrogate, ordering and performing treatments and interventions, ordering and review of laboratory studies, ordering and review of radiographic studies, pulse oximetry and re-evaluation of patient's condition.   Medications Ordered in ED Medications  0.9 %  sodium chloride infusion ( Intravenous New Bag/Given 08/09/20 1119)  ziprasidone (GEODON) injection 10 mg (10 mg Intramuscular Given 08/09/20 1243)  sterile water (preservative free) injection (10 mLs  Given 08/09/20 1243)  ziprasidone (GEODON) injection 10 mg (10 mg Intramuscular Given 08/09/20 1423)  sterile water (preservative free) injection (10 mLs  Given 08/09/20 1424)    ED Course  I have reviewed the triage vital signs and the nursing notes.  Pertinent labs & imaging results that were available during my care of the patient were reviewed by me and considered in my medical decision making (see chart for details).    MDM Rules/Calculators/A&P                          Patient's mental status is not baseline here this morning.  Raising concerns for significant head injury.  Vital signs are stable.  Breathing is fine.  Seems to be maintaining his secretions.  CT head shows evidence of a right-sided subdural dural hematoma.  About 3 mm.  No shift.  CT also of the head raises  concerns about new acute fractures to the orbits.  And they are recommending dedicated CT maxillofacial.  CT cervical spine raise questions about a ligamental injury.  There was a definite C4 spinous process fracture which would normally be stable.  And they recommended MRI cervical spine.  Discussed with Dr. Ellene Route neurosurgery who reviewed the CT head and CT cervical spine agreed with getting MRI cervical spine.  He will see the patient  Overall per tickly patient's urine here looks like there is a persistent urinary tract infection.  On the sixth he was treated with Rocephin for UTI.  Seems that to be the persistent.  That along with his change in mental status.  And then he woke up and became more aggressive again seems to be significantly confused probably all warrants admission.  Patient when he woke up MRI had not been completed he got very combative so he did have to receive Geodon try to calm him down so we get the study done of the MRI and the CT maxillofacial.  Final Clinical Impression(s) / ED Diagnoses Final diagnoses:  Fall, initial encounter  Subdural hematoma ([place])  Acute cystitis without hematuria  Multiple facial fractures, closed, initial encounter [facility])    Rx / DC Orders ED Discharge Orders     None        Fredia Sorrow, MD 08/09/20 1608

## 2020-08-09 NOTE — ED Triage Notes (Signed)
From [place]. Got up from [facility], dizzy and fell with contusion Right forehead noted. Bleeding controlled. Not on blood thinners, No LOC. BP 164 90, Hr 94, sat 94% RA, 98.8 temp, CBG 216. Hx Dementia and diabetes.

## 2020-08-09 NOTE — Consult Note (Signed)
Reason for Consult: Closed head injury with small subdural hematoma, C4 spinous process fracture Referring Physician: Fredia Sorrow MD  Patrick Phillips is an 80 y.o. male.  HPI: Patrick Phillips is a 80 year old individual who has had significant mention [facility] nursing home.  He apparently had a fall.  A CT scan of the head was performed as the patient was noted to be somewhat somnolent.  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  Is primarily on the right side.  There is no shift or mass-effect.  There is a considerable amount of cerebral atrophy.  Patient also had an incidental finding of a C4 spinous process fracture which appears nondisplaced.  There is a slight retrolisthesis of C3 on C4.  I was contacted around noon time and saw the patient in the emergency department approximately 1: 15 this afternoon.  At that time the patient was awake and arousable but somewhat agitated trying to get out of restraints.  He was moving all 4 extremities briskly.  His speech was coherent but he was confused and confabulating.  He had some abrasions about the right side of his face and head.  His pupils were 3 mm and equally reactive.  Extraocular movements were full and intact.  The face appeared symmetric.  Past Medical History:  Diagnosis Date   Cancer ([place])    skin cancer   Dementia ([place])    Diabetes mellitus without complication ([place])    Hypertension    Memory deficit     Past Surgical History:  Procedure Laterality Date   BACK SURGERY     HERNIA REPAIR     LOOP RECORDER INSERTION N/A 07/26/2017   Procedure: LOOP RECORDER INSERTION;  Surgeon: Evans Lance, MD;  Location: [facility];  Service: Cardiovascular;  Laterality: N/A;   TEE WITHOUT CARDIOVERSION N/A 07/26/2017   Procedure: TRANSESOPHAGEAL ECHOCARDIOGRAM (TEE);  Surgeon: Fay Records, MD;  Location: [facility];  Service: Cardiovascular;  Laterality: N/A;   TRIGGER FINGER RELEASE Right 12/02/2015   Procedure:  RIGHT THUMB RELEASE TRIGGER FINGER/A-1 PULLEY;  Surgeon: Leanora Cover, MD;  Location: [facility];  Service: Orthopedics;  Laterality: Right;    Family History  Problem Relation Age of Onset   Cancer Mother    Cancer Father    Cancer Brother    Cancer Other     Social History:  reports that he has never smoked. He has never used smokeless tobacco. He reports current alcohol use. He reports that he does not use drugs.  Allergies:  Allergies  Allergen Reactions   Aricept [Donepezil] Other (See Comments)    Per daughter, increased mood swings and anger- "Allergic," per MAR    Medications: I have reviewed the patient's current medications.  Results for orders placed or performed during the hospital encounter of 08/09/20 (from the past 48 hour(s))  CBC with Differential/Platelet     Status: Abnormal   Collection Time: 08/09/20 11:05 AM  Result Value Ref Range   WBC 11.7 (H) 4.0 - 10.5 K/uL   RBC 3.76 (L) 4.22 - 5.81 MIL/uL   Hemoglobin 10.9 (L) 13.0 - 17.0 g/dL   HCT 33.4 (L) 39.0 - 52.0 %   MCV 88.8 80.0 - 100.0 fL   MCH 29.0 26.0 - 34.0 pg   MCHC 32.6 30.0 - 36.0 g/dL   RDW 13.6 11.5 - 15.5 %   Platelets 302 150 - 400 K/uL   nRBC 0.0 0.0 - 0.2 %  Neutrophils Relative % 82 %   Neutro Abs 9.5 (H) 1.7 - 7.7 K/uL   Lymphocytes Relative 11 %   Lymphs Abs 1.3 0.7 - 4.0 K/uL   Monocytes Relative 7 %   Monocytes Absolute 0.8 0.1 - 1.0 K/uL   Eosinophils Relative 0 %   Eosinophils Absolute 0.0 0.0 - 0.5 K/uL   Basophils Relative 0 %   Basophils Absolute 0.0 0.0 - 0.1 K/uL   Immature Granulocytes 0 %   Abs Immature Granulocytes 0.05 0.00 - 0.07 K/uL    Comment: Performed at [facility] [address], [place], [place] [address]  Comprehensive metabolic panel     Status: Abnormal   Collection Time: 08/09/20 11:05 AM  Result Value Ref Range   Sodium 134 (L) 135 - 145 mmol/L   Potassium 4.0 3.5 - 5.1 mmol/L   Chloride 99 98 - 111 mmol/L   CO2 26 22 - 32  mmol/L   Glucose, Bld 169 (H) 70 - 99 mg/dL    Comment: Glucose reference range applies only to samples taken after fasting for at least 8 hours.   BUN 25 (H) 8 - 23 mg/dL   Creatinine, Ser 0.96 0.61 - 1.24 mg/dL   Calcium 9.0 8.9 - 10.3 mg/dL   Total Protein 5.5 (L) 6.5 - 8.1 g/dL   Albumin 2.9 (L) 3.5 - 5.0 g/dL   AST 28 15 - 41 U/L   ALT 28 0 - 44 U/L   Alkaline Phosphatase 43 38 - 126 U/L   Total Bilirubin 0.8 0.3 - 1.2 mg/dL   GFR, Estimated >60 >60 mL/min    Comment: (NOTE) Calculated using the CKD-EPI Creatinine Equation (2021)    Anion gap 9 5 - 15    Comment: Performed at [facility], [place] [address] [place], [place] [address]  Urinalysis, Routine w reflex microscopic Urine, Bag (ped)     Status: Abnormal   Collection Time: 08/09/20  3:00 PM  Result Value Ref Range   Color, Urine YELLOW YELLOW   APPearance TURBID (A) CLEAR   Specific Gravity, Urine 1.018 1.005 - 1.030   pH 5.0 5.0 - 8.0   Glucose, UA NEGATIVE NEGATIVE mg/dL   Hgb urine dipstick MODERATE (A) NEGATIVE   Bilirubin Urine NEGATIVE NEGATIVE   Ketones, ur 5 (A) NEGATIVE mg/dL   Protein, ur 30 (A) NEGATIVE mg/dL   Nitrite NEGATIVE NEGATIVE   Leukocytes,Ua LARGE (A) NEGATIVE   RBC / HPF 21-50 0 - 5 RBC/hpf   WBC, UA >50 (H) 0 - 5 WBC/hpf   Bacteria, UA RARE (A) NONE SEEN   Mucus PRESENT    Budding Yeast PRESENT    Hyaline Casts, UA PRESENT     Comment: Performed at [facility], [place] [address], [place], [place] [address]    CT Head Wo Contrast  Result Date: 08/09/2020 CLINICAL DATA:  Head trauma, minor. Poly trauma, critical, head/cervical spine injury suspected. Additional history provided: Dizziness, fall, contusion to right forehead. EXAM: CT HEAD WITHOUT CONTRAST CT CERVICAL SPINE WITHOUT CONTRAST TECHNIQUE: Multidetector CT imaging of the head and cervical spine was performed following the standard protocol without intravenous contrast. Multiplanar CT image reconstructions of the cervical  spine were also generated. COMPARISON:  CT head/maxillofacial and cervical spine 07/31/2020. FINDINGS: CT HEAD FINDINGS Brain: Mild generalized parenchymal atrophy. Thin acute subdural hematoma overlying the lateral aspect of the right frontal lobe measuring up to 2-3 mm in thickness (for instance as seen on  series 5, image 32). No demarcated cortical infarct. No evidence of intracranial mass. No midline shift. Vascular: No hyperdense vessel.  Atherosclerotic calcifications. Skull: Redemonstrated subacute comminuted fracture involving the outer table of the left frontal sinus with extension into the left orbital roof. Sinuses/Orbits: There is gas within the extraconal right orbit and within the right periorbital soft tissues. The globes are normal in size and contour. No definite retro bulbar hematoma is identified. Moderate mucosal thickening within the left frontal sinus. Mild mucosal thickening within the right frontal sinus. Mucosal thickening and fluid within the bilateral ethmoid air cells. Mild mucosal thickening and small mucous retention cysts within the bilateral maxillary sinuses. Other: Acute medially displaced fracture of the right lamina papyracea. A minimally depressed acute fracture of the right orbital floor may also be present. Soft tissue gas is present along the posterolateral wall the right maxillary sinus and within the right facial soft tissues. Right anterior scalp/forehead hematoma. CT CERVICAL SPINE FINDINGS Alignment: 2 mm C3-C4 grade 1 retrolisthesis, slightly increased from the prior cervical spine CT of 07/31/2020. Additionally, there is subtle widening at the anterior aspect of the C3-C4 and C4-C5 disc spaces as compared to the prior cervical spine CT. Partially imaged upper thoracic dextrocurvature. Skull base and vertebrae: The basion-dental and atlanto-dental intervals are maintained.Acute, mildly displaced fracture of the C4 spinous process (series 10, image 49). Soft tissues and  spinal canal: No prevertebral fluid or swelling. No visible canal hematoma. Disc levels: Cervical spondylosis with multilevel disc space narrowing, central disc protrusions, disc bulges, uncovertebral hypertrophy and facet arthrosis. Multilevel spinal canal stenosis. Most notably, a central disc protrusion contributes to apparent moderate spinal canal stenosis at C3-C4. Multilevel bony neural foraminal narrowing. There is a small amount of gas within the C3 disc space. Upper chest: No consolidation within the imaged lung apices. No visible pneumothorax. These results were called by telephone at the time of interpretation on 08/09/2020 at 11:14 am to provider Fredia Sorrow , who verbally acknowledged these results. IMPRESSION: CT head: 1. Thin acute subdural hematoma overlying the lateral right frontal lobe. 2. Right anterior scalp/forehead hematoma. 3. Acute medially displaced fracture of the right lamina papyracea. A minimally depressed acute fracture of the right orbital floor may also be present. A dedicated maxillofacial CT is recommended for further evaluation. 4. Gas within the extraconal right orbit and within the right periorbital soft tissues. 5. Additional gas along the posterolateral wall the right maxillary sinus and within the right face soft tissues. 6. Redemonstrated subacute comminuted and depressed fracture through the outer table of the left frontal calvarium with extension to the left orbital roof. 7. Paranasal sinus disease, as described. CT cervical spine: 1. Acute, mildly displaced fracture of the C4 spinous process. 2. There is 2 mm C3-C4 grade 1 retrolisthesis, progressed from the recent prior cervical spine CT of 07/31/2020. A small amount of gas is now present within the C3-C4 disc. Additionally, there is subtle widening at the anterior aspect of the C3-C4 and C4-C5 disc spaces as compared to the prior CT. An MRI of the cervical spine is recommended to evaluate for possible ligamentous  injury. 3. Cervical spondylosis, as described. Electronically Signed   By: Kellie Simmering DO   On: 08/09/2020 11:21   CT Cervical Spine Wo Contrast  Result Date: 08/09/2020 CLINICAL DATA:  Head trauma, minor. Poly trauma, critical, head/cervical spine injury suspected. Additional history provided: Dizziness, fall, contusion to right forehead. EXAM: CT HEAD WITHOUT CONTRAST CT CERVICAL SPINE WITHOUT CONTRAST TECHNIQUE:  Multidetector CT imaging of the head and cervical spine was performed following the standard protocol without intravenous contrast. Multiplanar CT image reconstructions of the cervical spine were also generated. COMPARISON:  CT head/maxillofacial and cervical spine 07/31/2020. FINDINGS: CT HEAD FINDINGS Brain: Mild generalized parenchymal atrophy. Thin acute subdural hematoma overlying the lateral aspect of the right frontal lobe measuring up to 2-3 mm in thickness (for instance as seen on series 5, image 32). No demarcated cortical infarct. No evidence of intracranial mass. No midline shift. Vascular: No hyperdense vessel.  Atherosclerotic calcifications. Skull: Redemonstrated subacute comminuted fracture involving the outer table of the left frontal sinus with extension into the left orbital roof. Sinuses/Orbits: There is gas within the extraconal right orbit and within the right periorbital soft tissues. The globes are normal in size and contour. No definite retro bulbar hematoma is identified. Moderate mucosal thickening within the left frontal sinus. Mild mucosal thickening within the right frontal sinus. Mucosal thickening and fluid within the bilateral ethmoid air cells. Mild mucosal thickening and small mucous retention cysts within the bilateral maxillary sinuses. Other: Acute medially displaced fracture of the right lamina papyracea. A minimally depressed acute fracture of the right orbital floor may also be present. Soft tissue gas is present along the posterolateral wall the right  maxillary sinus and within the right facial soft tissues. Right anterior scalp/forehead hematoma. CT CERVICAL SPINE FINDINGS Alignment: 2 mm C3-C4 grade 1 retrolisthesis, slightly increased from the prior cervical spine CT of 07/31/2020. Additionally, there is subtle widening at the anterior aspect of the C3-C4 and C4-C5 disc spaces as compared to the prior cervical spine CT. Partially imaged upper thoracic dextrocurvature. Skull base and vertebrae: The basion-dental and atlanto-dental intervals are maintained.Acute, mildly displaced fracture of the C4 spinous process (series 10, image 49). Soft tissues and spinal canal: No prevertebral fluid or swelling. No visible canal hematoma. Disc levels: Cervical spondylosis with multilevel disc space narrowing, central disc protrusions, disc bulges, uncovertebral hypertrophy and facet arthrosis. Multilevel spinal canal stenosis. Most notably, a central disc protrusion contributes to apparent moderate spinal canal stenosis at C3-C4. Multilevel bony neural foraminal narrowing. There is a small amount of gas within the C3 disc space. Upper chest: No consolidation within the imaged lung apices. No visible pneumothorax. These results were called by telephone at the time of interpretation on 08/09/2020 at 11:14 am to provider Fredia Sorrow , who verbally acknowledged these results. IMPRESSION: CT head: 1. Thin acute subdural hematoma overlying the lateral right frontal lobe. 2. Right anterior scalp/forehead hematoma. 3. Acute medially displaced fracture of the right lamina papyracea. A minimally depressed acute fracture of the right orbital floor may also be present. A dedicated maxillofacial CT is recommended for further evaluation. 4. Gas within the extraconal right orbit and within the right periorbital soft tissues. 5. Additional gas along the posterolateral wall the right maxillary sinus and within the right face soft tissues. 6. Redemonstrated subacute comminuted and  depressed fracture through the outer table of the left frontal calvarium with extension to the left orbital roof. 7. Paranasal sinus disease, as described. CT cervical spine: 1. Acute, mildly displaced fracture of the C4 spinous process. 2. There is 2 mm C3-C4 grade 1 retrolisthesis, progressed from the recent prior cervical spine CT of 07/31/2020. A small amount of gas is now present within the C3-C4 disc. Additionally, there is subtle widening at the anterior aspect of the C3-C4 and C4-C5 disc spaces as compared to the prior CT. An MRI of the cervical spine  is recommended to evaluate for possible ligamentous injury. 3. Cervical spondylosis, as described. Electronically Signed   By: Kellie Simmering DO   On: 08/09/2020 11:21    Review of Systems  Unable to perform ROS: Dementia  Blood pressure (!) 184/69, pulse 65, temperature 97.8 F (36.6 C), resp. rate 20, SpO2 96 %. Physical Exam Constitutional:      Appearance: Normal appearance.  HENT:     Head: Normocephalic.     Comments: Abrasions about the right side of the scalp    Right Ear: Tympanic membrane normal.     Left Ear: Tympanic membrane normal.     Nose: Nose normal.     Mouth/Throat:     Mouth: Mucous membranes are moist.  Eyes:     Extraocular Movements: Extraocular movements intact.     Conjunctiva/sclera: Conjunctivae normal.     Pupils: Pupils are equal, round, and reactive to light.  Neck:     Comments: In hard cervical collar but appears to be writhing around in bed without any complaints of pain Cardiovascular:     Rate and Rhythm: Normal rate and regular rhythm.  Pulmonary:     Effort: Pulmonary effort is normal.     Breath sounds: Normal breath sounds.  Abdominal:     General: Abdomen is flat.  Skin:    General: Skin is warm and dry.     Capillary Refill: Capillary refill takes less than 2 seconds.  Neurological:     Mental Status: He is alert.     Comments: Pupils 3 mm briskly reactive light accommodation extraocular  movements are full abrasions on right side of head noted. Motor function is intact in the upper and lower extremities.  Patient speech is intact but he is confused and confabulating  Psychiatric:     Comments: Disoriented and confused.  Trying to arrive out of restraints even while being talked to to be calmed.    Assessment/Plan: Closed head injury with small subdural hematoma in a patient with chronic dementia.  The CT of the cervical spine reveals a small nondisplaced spinous process fracture of C4.  I note that an MRI is ordered of the cervical spine.  The subdural is of no consequence and I do not feel follow-up scan needs to be considered.  The C-spine injury also is of little consequence and seeing how uncomfortable he is with a hard cervical collar I believe that the collar would be optional.  We will await the results of his MRI of the cervical spine but no intervention is likely.  Blanchie Dessert Shellyann Wandrey 08/09/2020, 4:41 PM

## 2020-08-09 NOTE — Consult Note (Signed)
Medical Consultation   Patrick Phillips  TIW:[id number]  DOB: 02/05/1941  DOA: 08/09/2020  PCP: [name], MD   Requesting physician: Tegler MD  Reason for consultation: Trauma, UTI, delirium   History of Present Illness: Patrick Phillips is an 80 y.o. male with h/o Vascular dementia, HTN, DM2.  Pt has h/o severe delirium in past.  Pt admitted in April for Delirium determined to be secondary to COVID-19.  Admitted again in May for delirium secondary to colitis.  During that admit had urinary retention, foley catheter placed.  Fall and facial trauma (L orbital fx) on 6/4.  Delirium due to UTI on 6/6, given IM rocephin in ED.  Pt presents to ED today [facility] fall at [facility].  Apparently got up, fell, hit head.  Pt with delirium and agitation.  Pt unable to provide any additional history.  Daughter at bedside: pt gets very agitated like this when ever he has an infection of any sort.  ED course: Work up in ED reveals: 1) UTI 2) small traumatic SDH 3) old L orbital fx unchanged from earlier this month 4) new R orbital fx 5) cervical spine bony and ligamentous injury, cant rule out cord edema.  Pt has required multiple rounds of Geodon for sedation thus far (thrashing around bed when not sedated).  Is now starting to wake up again as 2nd round of Geodon is wearing off.  Review of Systems: Unable to perform due to AMS.    Past Medical History: Past Medical History:  Diagnosis Date   Cancer ([place])    skin cancer   Dementia ([place])    Diabetes mellitus without complication ([place])    Hypertension    Memory deficit     Past Surgical History: Past Surgical History:  Procedure Laterality Date   BACK SURGERY     HERNIA REPAIR     LOOP RECORDER INSERTION N/A 07/26/2017   Procedure: LOOP RECORDER INSERTION;  Surgeon: Evans Lance, MD;  Location: [facility];  Service: Cardiovascular;  Laterality: N/A;   TEE WITHOUT CARDIOVERSION N/A 07/26/2017   Procedure:  TRANSESOPHAGEAL ECHOCARDIOGRAM (TEE);  Surgeon: Fay Records, MD;  Location: [facility];  Service: Cardiovascular;  Laterality: N/A;   TRIGGER FINGER RELEASE Right 12/02/2015   Procedure: RIGHT THUMB RELEASE TRIGGER FINGER/A-1 PULLEY;  Surgeon: Leanora Cover, MD;  Location: [facility];  Service: Orthopedics;  Laterality: Right;     Allergies:   Allergies  Allergen Reactions   Aricept [Donepezil] Other (See Comments)    Per daughter, increased mood swings and anger- "Allergic," per [facility]     Social History:  reports that he has never smoked. He has never used smokeless tobacco. He reports current alcohol use. He reports that he does not use drugs.   Family History: Family History  Problem Relation Age of Onset   Cancer Mother    Cancer Father    Cancer Brother    Cancer Other        Physical Exam: Vitals:   08/09/20 1800 08/09/20 1819 08/09/20 1857 08/09/20 2120  BP: (!) 172/114 (!) 197/103 (!) 177/91 (!) 154/59  Pulse: 93 87 82 62  Resp:  (!) 22 (!) 22 16  Temp:      TempSrc:      SpO2: 99% 98% 97% 100%    Constitutional: Elderly, sedated, starting to wake up and become delirious again. Eyes: PERLA, EOMI, irises  appear normal, anicteric sclera,  ENMT: Bruising about both eyes. Neck: Hard cervical collar in place CVS: S1-S2 clear, no murmur rubs or gallops, no LE edema, normal pedal pulses  Respiratory:  clear to auscultation bilaterally, no wheezing, rales or rhonchi. Respiratory effort normal. No accessory muscle use.  Abdomen: soft nontender, nondistended, normal bowel sounds, no hepatosplenomegaly, no hernias  Musculoskeletal: : no cyanosis, clubbing or edema noted bilaterally Neuro: MAE, purposeful movements in all 4 extremities Psych: Sedated, starting to wake up. Skin: no rashes or lesions or ulcers, no induration or nodules    Data reviewed:  I have personally reviewed following labs and imaging studies Labs:  CBC: Recent Labs  Lab  08/09/20 1105  WBC 11.7*  NEUTROABS 9.5*  HGB 10.9*  HCT 33.4*  MCV 88.8  PLT 063    Basic Metabolic Panel: Recent Labs  Lab 08/09/20 1105  NA 134*  K 4.0  CL 99  CO2 26  GLUCOSE 169*  BUN 25*  CREATININE 0.96  CALCIUM 9.0   GFR Estimated Creatinine Clearance: 63.5 mL/min (by C-G formula based on SCr of 0.96 mg/dL). Liver Function Tests: Recent Labs  Lab 08/09/20 1105  AST 28  ALT 28  ALKPHOS 43  BILITOT 0.8  PROT 5.5*  ALBUMIN 2.9*   No results for input(s): LIPASE, AMYLASE in the last 168 hours. Recent Labs  Lab 08/09/20 1645  AMMONIA 24   Coagulation profile Recent Labs  Lab 08/09/20 1645  INR 1.0    Cardiac Enzymes: No results for input(s): CKTOTAL, CKMB, CKMBINDEX, TROPONINI in the last 168 hours. BNP: Invalid input(s): POCBNP CBG: Recent Labs  Lab 08/03/20 0738 08/03/20 1234  GLUCAP 149* 212*   D-Dimer No results for input(s): DDIMER in the last 72 hours. Hgb A1c No results for input(s): HGBA1C in the last 72 hours. Lipid Profile No results for input(s): CHOL, HDL, LDLCALC, TRIG, CHOLHDL, LDLDIRECT in the last 72 hours. Thyroid function studies No results for input(s): TSH, T4TOTAL, T3FREE, THYROIDAB in the last 72 hours.  Invalid input(s): FREET3 Anemia work up No results for input(s): VITAMINB12, FOLATE, FERRITIN, TIBC, IRON, RETICCTPCT in the last 72 hours. Urinalysis    Component Value Date/Time   COLORURINE YELLOW 08/09/2020 1500   APPEARANCEUR TURBID (A) 08/09/2020 1500   LABSPEC 1.018 08/09/2020 1500   PHURINE 5.0 08/09/2020 1500   GLUCOSEU NEGATIVE 08/09/2020 1500   HGBUR MODERATE (A) 08/09/2020 1500   BILIRUBINUR NEGATIVE 08/09/2020 1500   KETONESUR 5 (A) 08/09/2020 1500   PROTEINUR 30 (A) 08/09/2020 1500   NITRITE NEGATIVE 08/09/2020 1500   LEUKOCYTESUR LARGE (A) 08/09/2020 1500     Sepsis Labs Invalid input(s): PROCALCITONIN,  WBC,  LACTICIDVEN Microbiology Recent Results (from the past 240 hour(s))  Resp  Panel by RT-PCR (Flu A&B, Covid) Nasopharyngeal Swab     Status: None   Collection Time: 08/02/20  4:23 PM   Specimen: Nasopharyngeal Swab; Nasopharyngeal(NP) swabs in vial transport medium  Result Value Ref Range Status   SARS Coronavirus 2 by RT PCR NEGATIVE NEGATIVE Final    Comment: (NOTE) SARS-CoV-2 target nucleic acids are NOT DETECTED.  The SARS-CoV-2 RNA is generally detectable in upper respiratory specimens during the acute phase of infection. The lowest concentration of SARS-CoV-2 viral copies this assay can detect is 138 copies/mL. A negative result does not preclude SARS-Cov-2 infection and should not be used as the sole basis for treatment or other patient management decisions. A negative result may occur with  improper specimen collection/handling, submission of  specimen other than nasopharyngeal swab, presence of viral mutation(s) within the areas targeted by this assay, and inadequate number of viral copies(<138 copies/mL). A negative result must be combined with clinical observations, patient history, and epidemiological information. The expected result is Negative.  Fact Sheet for Patients:  EntrepreneurPulse.com.au  Fact Sheet for Healthcare Providers:  IncredibleEmployment.be  This test is no t yet approved or cleared by the [place] FDA and  has been authorized for detection and/or diagnosis of SARS-CoV-2 by FDA under an Emergency Use Authorization (EUA). This EUA will remain  in effect (meaning this test can be used) for the duration of the COVID-19 declaration under Section 564(b)(1) of the Act, 21 U.S.C.section 360bbb-3(b)(1), unless the authorization is terminated  or revoked sooner.       Influenza A by PCR NEGATIVE NEGATIVE Final   Influenza B by PCR NEGATIVE NEGATIVE Final    Comment: (NOTE) The Xpert Xpress SARS-CoV-2/FLU/RSV plus assay is intended as an aid in the diagnosis of influenza from Nasopharyngeal  swab specimens and should not be used as a sole basis for treatment. Nasal washings and aspirates are unacceptable for Xpert Xpress SARS-CoV-2/FLU/RSV testing.  Fact Sheet for Patients: EntrepreneurPulse.com.au  Fact Sheet for Healthcare Providers: IncredibleEmployment.be  This test is not yet approved or cleared by the [place] FDA and has been authorized for detection and/or diagnosis of SARS-CoV-2 by FDA under an Emergency Use Authorization (EUA). This EUA will remain in effect (meaning this test can be used) for the duration of the COVID-19 declaration under Section 564(b)(1) of the Act, 21 U.S.C. section 360bbb-3(b)(1), unless the authorization is terminated or revoked.  Performed at [facility], [place] [address] [place], [place] [address]        Inpatient Medications:   Scheduled Meds: Continuous Infusions:  sodium chloride 75 mL/hr at 08/09/20 1119   [START ON 08/10/2020] cefTRIAXone (ROCEPHIN)  IV       Radiological Exams on Admission: CT Head Wo Contrast  Result Date: 08/09/2020 CLINICAL DATA:  Head trauma, minor. Poly trauma, critical, head/cervical spine injury suspected. Additional history provided: Dizziness, fall, contusion to right forehead. EXAM: CT HEAD WITHOUT CONTRAST CT CERVICAL SPINE WITHOUT CONTRAST TECHNIQUE: Multidetector CT imaging of the head and cervical spine was performed following the standard protocol without intravenous contrast. Multiplanar CT image reconstructions of the cervical spine were also generated. COMPARISON:  CT head/maxillofacial and cervical spine 07/31/2020. FINDINGS: CT HEAD FINDINGS Brain: Mild generalized parenchymal atrophy. Thin acute subdural hematoma overlying the lateral aspect of the right frontal lobe measuring up to 2-3 mm in thickness (for instance as seen on series 5, image 32). No demarcated cortical infarct. No evidence of intracranial mass. No midline shift. Vascular: No  hyperdense vessel.  Atherosclerotic calcifications. Skull: Redemonstrated subacute comminuted fracture involving the outer table of the left frontal sinus with extension into the left orbital roof. Sinuses/Orbits: There is gas within the extraconal right orbit and within the right periorbital soft tissues. The globes are normal in size and contour. No definite retro bulbar hematoma is identified. Moderate mucosal thickening within the left frontal sinus. Mild mucosal thickening within the right frontal sinus. Mucosal thickening and fluid within the bilateral ethmoid air cells. Mild mucosal thickening and small mucous retention cysts within the bilateral maxillary sinuses. Other: Acute medially displaced fracture of the right lamina papyracea. A minimally depressed acute fracture of the right orbital floor may also be present. Soft tissue gas is present along the posterolateral wall the right maxillary sinus  and within the right facial soft tissues. Right anterior scalp/forehead hematoma. CT CERVICAL SPINE FINDINGS Alignment: 2 mm C3-C4 grade 1 retrolisthesis, slightly increased from the prior cervical spine CT of 07/31/2020. Additionally, there is subtle widening at the anterior aspect of the C3-C4 and C4-C5 disc spaces as compared to the prior cervical spine CT. Partially imaged upper thoracic dextrocurvature. Skull base and vertebrae: The basion-dental and atlanto-dental intervals are maintained.Acute, mildly displaced fracture of the C4 spinous process (series 10, image 49). Soft tissues and spinal canal: No prevertebral fluid or swelling. No visible canal hematoma. Disc levels: Cervical spondylosis with multilevel disc space narrowing, central disc protrusions, disc bulges, uncovertebral hypertrophy and facet arthrosis. Multilevel spinal canal stenosis. Most notably, a central disc protrusion contributes to apparent moderate spinal canal stenosis at C3-C4. Multilevel bony neural foraminal narrowing. There is a  small amount of gas within the C3 disc space. Upper chest: No consolidation within the imaged lung apices. No visible pneumothorax. These results were called by telephone at the time of interpretation on 08/09/2020 at 11:14 am to provider Fredia Sorrow , who verbally acknowledged these results. IMPRESSION: CT head: 1. Thin acute subdural hematoma overlying the lateral right frontal lobe. 2. Right anterior scalp/forehead hematoma. 3. Acute medially displaced fracture of the right lamina papyracea. A minimally depressed acute fracture of the right orbital floor may also be present. A dedicated maxillofacial CT is recommended for further evaluation. 4. Gas within the extraconal right orbit and within the right periorbital soft tissues. 5. Additional gas along the posterolateral wall the right maxillary sinus and within the right face soft tissues. 6. Redemonstrated subacute comminuted and depressed fracture through the outer table of the left frontal calvarium with extension to the left orbital roof. 7. Paranasal sinus disease, as described. CT cervical spine: 1. Acute, mildly displaced fracture of the C4 spinous process. 2. There is 2 mm C3-C4 grade 1 retrolisthesis, progressed from the recent prior cervical spine CT of 07/31/2020. A small amount of gas is now present within the C3-C4 disc. Additionally, there is subtle widening at the anterior aspect of the C3-C4 and C4-C5 disc spaces as compared to the prior CT. An MRI of the cervical spine is recommended to evaluate for possible ligamentous injury. 3. Cervical spondylosis, as described. Electronically Signed   By: Kellie Simmering DO   On: 08/09/2020 11:21   CT Cervical Spine Wo Contrast  Result Date: 08/09/2020 CLINICAL DATA:  Head trauma, minor. Poly trauma, critical, head/cervical spine injury suspected. Additional history provided: Dizziness, fall, contusion to right forehead. EXAM: CT HEAD WITHOUT CONTRAST CT CERVICAL SPINE WITHOUT CONTRAST TECHNIQUE:  Multidetector CT imaging of the head and cervical spine was performed following the standard protocol without intravenous contrast. Multiplanar CT image reconstructions of the cervical spine were also generated. COMPARISON:  CT head/maxillofacial and cervical spine 07/31/2020. FINDINGS: CT HEAD FINDINGS Brain: Mild generalized parenchymal atrophy. Thin acute subdural hematoma overlying the lateral aspect of the right frontal lobe measuring up to 2-3 mm in thickness (for instance as seen on series 5, image 32). No demarcated cortical infarct. No evidence of intracranial mass. No midline shift. Vascular: No hyperdense vessel.  Atherosclerotic calcifications. Skull: Redemonstrated subacute comminuted fracture involving the outer table of the left frontal sinus with extension into the left orbital roof. Sinuses/Orbits: There is gas within the extraconal right orbit and within the right periorbital soft tissues. The globes are normal in size and contour. No definite retro bulbar hematoma is identified. Moderate mucosal thickening within the  left frontal sinus. Mild mucosal thickening within the right frontal sinus. Mucosal thickening and fluid within the bilateral ethmoid air cells. Mild mucosal thickening and small mucous retention cysts within the bilateral maxillary sinuses. Other: Acute medially displaced fracture of the right lamina papyracea. A minimally depressed acute fracture of the right orbital floor may also be present. Soft tissue gas is present along the posterolateral wall the right maxillary sinus and within the right facial soft tissues. Right anterior scalp/forehead hematoma. CT CERVICAL SPINE FINDINGS Alignment: 2 mm C3-C4 grade 1 retrolisthesis, slightly increased from the prior cervical spine CT of 07/31/2020. Additionally, there is subtle widening at the anterior aspect of the C3-C4 and C4-C5 disc spaces as compared to the prior cervical spine CT. Partially imaged upper thoracic dextrocurvature.  Skull base and vertebrae: The basion-dental and atlanto-dental intervals are maintained.Acute, mildly displaced fracture of the C4 spinous process (series 10, image 49). Soft tissues and spinal canal: No prevertebral fluid or swelling. No visible canal hematoma. Disc levels: Cervical spondylosis with multilevel disc space narrowing, central disc protrusions, disc bulges, uncovertebral hypertrophy and facet arthrosis. Multilevel spinal canal stenosis. Most notably, a central disc protrusion contributes to apparent moderate spinal canal stenosis at C3-C4. Multilevel bony neural foraminal narrowing. There is a small amount of gas within the C3 disc space. Upper chest: No consolidation within the imaged lung apices. No visible pneumothorax. These results were called by telephone at the time of interpretation on 08/09/2020 at 11:14 am to provider Fredia Sorrow , who verbally acknowledged these results. IMPRESSION: CT head: 1. Thin acute subdural hematoma overlying the lateral right frontal lobe. 2. Right anterior scalp/forehead hematoma. 3. Acute medially displaced fracture of the right lamina papyracea. A minimally depressed acute fracture of the right orbital floor may also be present. A dedicated maxillofacial CT is recommended for further evaluation. 4. Gas within the extraconal right orbit and within the right periorbital soft tissues. 5. Additional gas along the posterolateral wall the right maxillary sinus and within the right face soft tissues. 6. Redemonstrated subacute comminuted and depressed fracture through the outer table of the left frontal calvarium with extension to the left orbital roof. 7. Paranasal sinus disease, as described. CT cervical spine: 1. Acute, mildly displaced fracture of the C4 spinous process. 2. There is 2 mm C3-C4 grade 1 retrolisthesis, progressed from the recent prior cervical spine CT of 07/31/2020. A small amount of gas is now present within the C3-C4 disc. Additionally, there is  subtle widening at the anterior aspect of the C3-C4 and C4-C5 disc spaces as compared to the prior CT. An MRI of the cervical spine is recommended to evaluate for possible ligamentous injury. 3. Cervical spondylosis, as described. Electronically Signed   By: Kellie Simmering DO   On: 08/09/2020 11:21   MR Cervical Spine Wo Contrast  Result Date: 08/09/2020 CLINICAL DATA:  Fall. Abnormal cervical spine CT. C4 spinous process fracture. EXAM: MRI CERVICAL SPINE WITHOUT CONTRAST TECHNIQUE: Multiplanar, multisequence MR imaging of the cervical spine was performed. No intravenous contrast was administered. COMPARISON:  CT 08/09/2020 FINDINGS: Technical note: Despite efforts by the technologist and patient, significant motion artifact is present on today's exam and could not be eliminated. This reduces exam sensitivity and specificity. Alignment: Trace retrolisthesis of C3 on C4. Mild asymmetric widening of the C3-4 disc space anteriorly with high T1/T2 signal within the central and posterior aspects of the disc, suggestive of blood product. Exaggerated cervical lordosis. Vertebrae: Known C4 spinous process fracture is not well seen  on motion degraded MR images. No additional fracture is identified on motion degraded images. No vertebral body bone marrow edema. Vertebral body heights are maintained. Cord: Motion degraded. Increased cord signal at the C3-4 and C4-5 levels cannot be excluded. Posterior Fossa, vertebral arteries, paraspinal tissues: There is disruption of the anterior longitudinal ligament between the C3-4 and C4-5 levels (series 5, image 8) with a small amount of prevertebral fluid extending from the C1-2 to C4-5 levels measuring approximately 5 mm in AP thickness. Interspinous ligament edema at C3-4 and C4-5. Disc levels: C2-C3: Motion degraded. No evidence of disc protrusion. No appreciable foraminal or canal stenosis. C3-C4: Motion degraded. Slight retrolisthesis and posterior disc osteophyte complex  result and at least moderate canal stenosis. Right greater than left foraminal stenosis. C4-C5: Motion degraded. Disc osteophyte complex and uncovertebral spurring result in at least moderate canal stenosis and at least mild right foraminal stenosis. C5-C6: Motion degraded. Suspect small disc osteophyte complex. There may be mild foraminal and canal stenosis at this level. C6-C7: Motion degraded. No evidence of high-grade foraminal or canal stenosis. C7-T1: Motion degraded. No evidence of high-grade canal or foraminal stenosis. IMPRESSION: 1. Significantly motion degraded study. 2. Findings of cervical hyperextension injury with disruption of the C3-4 disc space including a small amount of hemorrhage within the C3-4 disc. There is disruption of the anterior longitudinal ligament between the C3-4 and C4-5 levels with small amount of prevertebral fluid/hemorrhage. Interspinous ligament injury at C3-4 and C4-5. 3. Cord edema/contusion cannot be excluded on motion degraded images. 4. C4 spinous process fracture, better seen on recent CT. No additional fractures are identified on this study. 5. Degenerative changes of the cervical spine appear most pronounced at the C3-4 and C4-5 levels where there is at least moderate canal stenosis. These results will be called to the ordering clinician or representative by the Radiologist Assistant, and communication documented in the PACS or [employer]. Electronically Signed   By: Davina Poke D.O.   On: 08/09/2020 20:27   CT Maxillofacial WO CM  Result Date: 08/09/2020 CLINICAL DATA:  Facial trauma. Dizziness in fall with contusion to the right forehead. EXAM: CT MAXILLOFACIAL WITHOUT CONTRAST TECHNIQUE: Multidetector CT imaging of the maxillofacial structures was performed. Multiplanar CT image reconstructions were also generated. COMPARISON:  07/31/2020 FINDINGS: Osseous: Mildly depressed fractures of the left anterior frontal bone involving the outer table of the  left frontal sinus. Fracture lines extend to the superior left orbital rim. Mildly depressed right medial, lateral, and inferior orbital rim fractures. Cortical irregularity in the anterior and lateral right maxillary antral wall likely represents nondisplaced fractures. The left maxillary antral walls, nasal bones, zygomatic arches, pterygoid plates, mandibles, and temporomandibular joints appear intact. Multiple prior dental extractions. Orbits: Moderate right periorbital soft tissue hematoma with soft tissue gas inferior to the right orbit and extraconal gas in the right orbit. Globes and extraocular muscles appear intact and symmetrical. No intraconal infiltration or gas. Sinuses: Mucosal thickening in the paranasal sinuses. No acute air-fluid levels. Mastoid air cells are clear. Soft tissues: Periorbital and right facial soft tissue swelling and gas as discussed above. Gas extends along the right face inferiorly to the maxillary and upper mandibular region. Small amount of gas in the right masseter space. Limited intracranial: Atrophic changes are suggested. IMPRESSION: 1. Depressed left frontal and superior orbital rim fractures are unchanged since previous study of 07/31/2020. Left periorbital soft tissue swelling is decreased. 2. New fractures of the right orbit and maxillary sinus walls with new right  periorbital hematoma, right periorbital and infraorbital soft tissue gas, and extraconal right orbital gas. Electronically Signed   By: Lucienne Capers M.D.   On: 08/09/2020 21:31    Impression/Recommendations Principal Problem:   Cervical spinal cord injury, initial encounter [facility]) Active Problems:   Hypertension   Type 2 diabetes mellitus ([facility])   Acute metabolic encephalopathy   Vascular dementia ([place])   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[facility])   SDH (subdural hematoma) ([facility])   Right orbital fracture, closed, initial encounter ([place])   Fracture of orbit, left, closed, with routine healing,  subsequent encounter   Urinary tract infection associated with catheterization of urinary tract, initial encounter ([place])  Cervical spinal cord injury - In hard cervical collar See NS notes, management per NS and trauma surgery Fracture of R orbit - Trauma ENT to see in AM Fracture of L orbit - No new changes on CT, management per ENT SDH - See NS note, very small traumatic SDH, NS doesn't think this will be a surgical issue Delirium on top of vascular dementia - Likely to need ongoing sedation to prevent thrashing around and potential worsening of injury.  Unfortunately I am not allowed to order IV/IM Geodon on floor.  Haldol and ativan dont seem to work very well historically for patient. Consider precedex given sedation needs and hypertension (does require ICU admit though). Will therefore have to defer sedation decision to trauma surgery. Cont seroquel if able to take POs Secondary to any / all of above traumatic injuries and/or UTI SW consult for SNF placement as it sounds like he is being dismissed from [facility] per daughter. 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   Thank you for this consultation.  Our [facility] hospitalist team will follow the patient with you.   Time Spent: 120 min  Ilay Capshaw M. D.O. [facility] 08/09/2020, 10:18 PM

## 2020-08-09 NOTE — ED Notes (Signed)
ED Provider at bedside. 

## 2020-08-09 NOTE — ED Notes (Signed)
Dr Gustavus Messing notified of confusion/agitation and BP 197/103.

## 2020-08-09 NOTE — ED Notes (Signed)
c-collar placed per MD instruction.

## 2020-08-09 NOTE — ED Notes (Signed)
Dr Gustavus Messing notified of BP >244 systolic. Daughter remains at bedside.

## 2020-08-09 NOTE — Progress Notes (Signed)
Patient ID: Patrick Phillips, male   DOB: 05-05-1940, 80 y.o.   MRN: [id number] I have reviewed th mri of the cervical spine and note anterior ligamentous disruption and disc disruption at C3-4 . For now collar immobilization is what I would recommend.  A better quality mri would be helpful and perhaps this will be possible if his agitation can be controlled.

## 2020-08-09 NOTE — Progress Notes (Signed)
Pharmacy Antibiotic Note  Patrick Phillips is a 80 y.o. male admitted on 08/09/2020 with UTI.  Pharmacy has been consulted for cefepime dosing.  Plan: Cefepime 2gm IV q8 hours F/u renal function, cultures and clinical course    Temp (24hrs), Avg:97.8 F (36.6 C), Min:97.8 F (36.6 C), Max:97.8 F (36.6 C)  Recent Labs  Lab 08/09/20 1105  WBC 11.7*  CREATININE 0.96    Estimated Creatinine Clearance: 63.5 mL/min (by C-G formula based on SCr of 0.96 mg/dL).    Allergies  Allergen Reactions   Aricept [Donepezil] Other (See Comments)    Per daughter, increased mood swings and anger- "Allergic," per [facility]    Thank you for allowing pharmacy to be a part of this patient's care.  Beverlee Nims 08/09/2020 10:49 PM

## 2020-08-09 NOTE — H&P (Signed)
[employer] 16-Feb-1941  [id number].    Requesting MD: Dr. Sherry Ruffing Chief Complaint/Reason for Consult: trauma  HPI:  [name] is a 80 yo male with vascular dementia who presented to the ED today after a fall at a SNF. His daughter reports that for the last 2 months he has had multiple admissions and a recent UTI, and currently has a foley in place for urinary retention. She says he often has agitation when he has an infection. The last few days he was more lucid, but today had a fall from standing. He had a fall earlier this month during which sustained left orbital fractures. His workup in the ED today showed new right orbital fractures, as well as an acute right frontal SDH, and C spine fractures with possible ligamentous injury. His UA was concerning for a UTI and antibiotics have been started. Trauma has been consulted for admission due to multisystem injuries. Neurosurgery has already evaluated the patient and is recommending continued C spine immobilization for now.  The patient has been agitated in the ED requiring 2 doses of Geodon. He is on ativan as an outpatient, and per other notes haldol has not been very effective for him in the past. At the time of my exam he was disoriented and mildly agitated but easily redirected. His daughter says that earlier he reported lower abdominal pain and back pain.  ROS: Review of Systems  Unable to perform ROS: Mental status change   Family History  Problem Relation Age of Onset   Cancer Mother    Cancer Father    Cancer Brother    Cancer Other     Past Medical History:  Diagnosis Date   Cancer ([place])    skin cancer   Dementia ([place])    Diabetes mellitus without complication ([place])    Hypertension    Memory deficit     Past Surgical History:  Procedure Laterality Date   BACK SURGERY     HERNIA REPAIR     LOOP RECORDER INSERTION N/A 07/26/2017   Procedure: LOOP RECORDER INSERTION;  Surgeon: Evans Lance, MD;  Location: [facility];  Service: Cardiovascular;  Laterality: N/A;   TEE WITHOUT CARDIOVERSION N/A 07/26/2017   Procedure: TRANSESOPHAGEAL ECHOCARDIOGRAM (TEE);  Surgeon: Fay Records, MD;  Location: [facility] ENDOSCOPY;  Service: Cardiovascular;  Laterality: N/A;   TRIGGER FINGER RELEASE Right 12/02/2015   Procedure: RIGHT THUMB RELEASE TRIGGER FINGER/A-1 PULLEY;  Surgeon: Leanora Cover, MD;  Location: [facility];  Service: Orthopedics;  Laterality: Right;    Social History:  reports that he has never smoked. He has never used smokeless tobacco. He reports current alcohol use. He reports that he does not use drugs.  Allergies:  Allergies  Allergen Reactions   Aricept [Donepezil] Other (See Comments)    Per daughter, increased mood swings and anger- "Allergic," per [facility]    (Not in a hospital admission)    Physical Exam: Blood pressure (!) 154/59, pulse 62, temperature 97.8 F (36.6 C), resp. rate 16, SpO2 100 %. General: resting comfortably, NAD Neurological: alert but disoriented, moves all extremities spontaneously, no focal deficits HEENT: normocephalic, superficial skin tears on the scalp with no active bleeding or deep lacerations. Bilateral periorbital ecchymoses and edema, with scleral injection. CV: regular rate and rhythm, extremities warm and well-perfused. No extremity deformities. Respiratory: normal work of breathing, lungs clear to auscultation bilaterally, symmetric chest wall expansion. No chest wall ecchymoses or deformities. Abdomen: soft, nondistended,  nontender to deep palpation. No masses or organomegaly. No abdominal wall ecchymoses. Extremities: warm and well-perfused, no deformities, moving all extremities spontaneously Psychiatric: mildly agitated, disoriented Skin: warm and dry, no jaundice   Results for orders placed or performed during the hospital encounter of 08/09/20 (from the past 48 hour(s))  CBC with Differential/Platelet     Status: Abnormal    Collection Time: 08/09/20 11:05 AM  Result Value Ref Range   WBC 11.7 (H) 4.0 - 10.5 K/uL   RBC 3.76 (L) 4.22 - 5.81 MIL/uL   Hemoglobin 10.9 (L) 13.0 - 17.0 g/dL   HCT 33.4 (L) 39.0 - 52.0 %   MCV 88.8 80.0 - 100.0 fL   MCH 29.0 26.0 - 34.0 pg   MCHC 32.6 30.0 - 36.0 g/dL   RDW 13.6 11.5 - 15.5 %   Platelets 302 150 - 400 K/uL   nRBC 0.0 0.0 - 0.2 %   Neutrophils Relative % 82 %   Neutro Abs 9.5 (H) 1.7 - 7.7 K/uL   Lymphocytes Relative 11 %   Lymphs Abs 1.3 0.7 - 4.0 K/uL   Monocytes Relative 7 %   Monocytes Absolute 0.8 0.1 - 1.0 K/uL   Eosinophils Relative 0 %   Eosinophils Absolute 0.0 0.0 - 0.5 K/uL   Basophils Relative 0 %   Basophils Absolute 0.0 0.0 - 0.1 K/uL   Immature Granulocytes 0 %   Abs Immature Granulocytes 0.05 0.00 - 0.07 K/uL    Comment: Performed at [facility], [address] [place], [place] [address]  Comprehensive metabolic panel     Status: Abnormal   Collection Time: 08/09/20 11:05 AM  Result Value Ref Range   Sodium 134 (L) 135 - 145 mmol/L   Potassium 4.0 3.5 - 5.1 mmol/L   Chloride 99 98 - 111 mmol/L   CO2 26 22 - 32 mmol/L   Glucose, Bld 169 (H) 70 - 99 mg/dL    Comment: Glucose reference range applies only to samples taken after fasting for at least 8 hours.   BUN 25 (H) 8 - 23 mg/dL   Creatinine, Ser 0.96 0.61 - 1.24 mg/dL   Calcium 9.0 8.9 - 10.3 mg/dL   Total Protein 5.5 (L) 6.5 - 8.1 g/dL   Albumin 2.9 (L) 3.5 - 5.0 g/dL   AST 28 15 - 41 U/L   ALT 28 0 - 44 U/L   Alkaline Phosphatase 43 38 - 126 U/L   Total Bilirubin 0.8 0.3 - 1.2 mg/dL   GFR, Estimated >60 >60 mL/min    Comment: (NOTE) Calculated using the CKD-EPI Creatinine Equation (2021)    Anion gap 9 5 - 15    Comment: Performed at [facility], [place] [address], [place], [place] [address]  Urinalysis, Routine w reflex microscopic Urine, Bag (ped)     Status: Abnormal   Collection Time: 08/09/20  3:00 PM  Result Value Ref Range   Color, Urine YELLOW YELLOW    APPearance TURBID (A) CLEAR   Specific Gravity, Urine 1.018 1.005 - 1.030   pH 5.0 5.0 - 8.0   Glucose, UA NEGATIVE NEGATIVE mg/dL   Hgb urine dipstick MODERATE (A) NEGATIVE   Bilirubin Urine NEGATIVE NEGATIVE   Ketones, ur 5 (A) NEGATIVE mg/dL   Protein, ur 30 (A) NEGATIVE mg/dL   Nitrite NEGATIVE NEGATIVE   Leukocytes,Ua LARGE (A) NEGATIVE   RBC / HPF 21-50 0 - 5 RBC/hpf   WBC, UA >50 (H) 0 - 5  WBC/hpf   Bacteria, UA RARE (A) NONE SEEN   Mucus PRESENT    Budding Yeast PRESENT    Hyaline Casts, UA PRESENT     Comment: Performed at [facility], [place] [address] [place], [place] [address]  Ammonia     Status: None   Collection Time: 08/09/20  4:45 PM  Result Value Ref Range   Ammonia 24 9 - 35 umol/L    Comment: Performed at [facility], [place] [address] [place], [place] [address]  Protime-INR     Status: None   Collection Time: 08/09/20  4:45 PM  Result Value Ref Range   Prothrombin Time 13.7 11.4 - 15.2 seconds   INR 1.0 0.8 - 1.2    Comment: (NOTE) INR goal varies based on device and disease states. Performed at [facility], [place] [address], [place], [place] [address]    CT Head Wo Contrast  Result Date: 08/09/2020 CLINICAL DATA:  Head trauma, minor. Poly trauma, critical, head/cervical spine injury suspected. Additional history provided: Dizziness, fall, contusion to right forehead. EXAM: CT HEAD WITHOUT CONTRAST CT CERVICAL SPINE WITHOUT CONTRAST TECHNIQUE: Multidetector CT imaging of the head and cervical spine was performed following the standard protocol without intravenous contrast. Multiplanar CT image reconstructions of the cervical spine were also generated. COMPARISON:  CT head/maxillofacial and cervical spine 07/31/2020. FINDINGS: CT HEAD FINDINGS Brain: Mild generalized parenchymal atrophy. Thin acute subdural hematoma overlying the lateral aspect of the right frontal lobe measuring up to 2-3 mm in thickness (for instance as seen on series 5, image  32). No demarcated cortical infarct. No evidence of intracranial mass. No midline shift. Vascular: No hyperdense vessel.  Atherosclerotic calcifications. Skull: Redemonstrated subacute comminuted fracture involving the outer table of the left frontal sinus with extension into the left orbital roof. Sinuses/Orbits: There is gas within the extraconal right orbit and within the right periorbital soft tissues. The globes are normal in size and contour. No definite retro bulbar hematoma is identified. Moderate mucosal thickening within the left frontal sinus. Mild mucosal thickening within the right frontal sinus. Mucosal thickening and fluid within the bilateral ethmoid air cells. Mild mucosal thickening and small mucous retention cysts within the bilateral maxillary sinuses. Other: Acute medially displaced fracture of the right lamina papyracea. A minimally depressed acute fracture of the right orbital floor may also be present. Soft tissue gas is present along the posterolateral wall the right maxillary sinus and within the right facial soft tissues. Right anterior scalp/forehead hematoma. CT CERVICAL SPINE FINDINGS Alignment: 2 mm C3-C4 grade 1 retrolisthesis, slightly increased from the prior cervical spine CT of 07/31/2020. Additionally, there is subtle widening at the anterior aspect of the C3-C4 and C4-C5 disc spaces as compared to the prior cervical spine CT. Partially imaged upper thoracic dextrocurvature. Skull base and vertebrae: The basion-dental and atlanto-dental intervals are maintained.Acute, mildly displaced fracture of the C4 spinous process (series 10, image 49). Soft tissues and spinal canal: No prevertebral fluid or swelling. No visible canal hematoma. Disc levels: Cervical spondylosis with multilevel disc space narrowing, central disc protrusions, disc bulges, uncovertebral hypertrophy and facet arthrosis. Multilevel spinal canal stenosis. Most notably, a central disc protrusion contributes to  apparent moderate spinal canal stenosis at C3-C4. Multilevel bony neural foraminal narrowing. There is a small amount of gas within the C3 disc space. Upper chest: No consolidation within the imaged lung apices. No visible pneumothorax. These results were called by telephone at the time of interpretation on 08/09/2020  at 11:14 am to provider Fredia Sorrow , who verbally acknowledged these results. IMPRESSION: CT head: 1. Thin acute subdural hematoma overlying the lateral right frontal lobe. 2. Right anterior scalp/forehead hematoma. 3. Acute medially displaced fracture of the right lamina papyracea. A minimally depressed acute fracture of the right orbital floor may also be present. A dedicated maxillofacial CT is recommended for further evaluation. 4. Gas within the extraconal right orbit and within the right periorbital soft tissues. 5. Additional gas along the posterolateral wall the right maxillary sinus and within the right face soft tissues. 6. Redemonstrated subacute comminuted and depressed fracture through the outer table of the left frontal calvarium with extension to the left orbital roof. 7. Paranasal sinus disease, as described. CT cervical spine: 1. Acute, mildly displaced fracture of the C4 spinous process. 2. There is 2 mm C3-C4 grade 1 retrolisthesis, progressed from the recent prior cervical spine CT of 07/31/2020. A small amount of gas is now present within the C3-C4 disc. Additionally, there is subtle widening at the anterior aspect of the C3-C4 and C4-C5 disc spaces as compared to the prior CT. An MRI of the cervical spine is recommended to evaluate for possible ligamentous injury. 3. Cervical spondylosis, as described. Electronically Signed   By: Kellie Simmering DO   On: 08/09/2020 11:21   CT Cervical Spine Wo Contrast  Result Date: 08/09/2020 CLINICAL DATA:  Head trauma, minor. Poly trauma, critical, head/cervical spine injury suspected. Additional history provided: Dizziness, fall,  contusion to right forehead. EXAM: CT HEAD WITHOUT CONTRAST CT CERVICAL SPINE WITHOUT CONTRAST TECHNIQUE: Multidetector CT imaging of the head and cervical spine was performed following the standard protocol without intravenous contrast. Multiplanar CT image reconstructions of the cervical spine were also generated. COMPARISON:  CT head/maxillofacial and cervical spine 07/31/2020. FINDINGS: CT HEAD FINDINGS Brain: Mild generalized parenchymal atrophy. Thin acute subdural hematoma overlying the lateral aspect of the right frontal lobe measuring up to 2-3 mm in thickness (for instance as seen on series 5, image 32). No demarcated cortical infarct. No evidence of intracranial mass. No midline shift. Vascular: No hyperdense vessel.  Atherosclerotic calcifications. Skull: Redemonstrated subacute comminuted fracture involving the outer table of the left frontal sinus with extension into the left orbital roof. Sinuses/Orbits: There is gas within the extraconal right orbit and within the right periorbital soft tissues. The globes are normal in size and contour. No definite retro bulbar hematoma is identified. Moderate mucosal thickening within the left frontal sinus. Mild mucosal thickening within the right frontal sinus. Mucosal thickening and fluid within the bilateral ethmoid air cells. Mild mucosal thickening and small mucous retention cysts within the bilateral maxillary sinuses. Other: Acute medially displaced fracture of the right lamina papyracea. A minimally depressed acute fracture of the right orbital floor may also be present. Soft tissue gas is present along the posterolateral wall the right maxillary sinus and within the right facial soft tissues. Right anterior scalp/forehead hematoma. CT CERVICAL SPINE FINDINGS Alignment: 2 mm C3-C4 grade 1 retrolisthesis, slightly increased from the prior cervical spine CT of 07/31/2020. Additionally, there is subtle widening at the anterior aspect of the C3-C4 and C4-C5  disc spaces as compared to the prior cervical spine CT. Partially imaged upper thoracic dextrocurvature. Skull base and vertebrae: The basion-dental and atlanto-dental intervals are maintained.Acute, mildly displaced fracture of the C4 spinous process (series 10, image 49). Soft tissues and spinal canal: No prevertebral fluid or swelling. No visible canal hematoma. Disc levels: Cervical spondylosis with multilevel disc space narrowing,  central disc protrusions, disc bulges, uncovertebral hypertrophy and facet arthrosis. Multilevel spinal canal stenosis. Most notably, a central disc protrusion contributes to apparent moderate spinal canal stenosis at C3-C4. Multilevel bony neural foraminal narrowing. There is a small amount of gas within the C3 disc space. Upper chest: No consolidation within the imaged lung apices. No visible pneumothorax. These results were called by telephone at the time of interpretation on 08/09/2020 at 11:14 am to provider Fredia Sorrow , who verbally acknowledged these results. IMPRESSION: CT head: 1. Thin acute subdural hematoma overlying the lateral right frontal lobe. 2. Right anterior scalp/forehead hematoma. 3. Acute medially displaced fracture of the right lamina papyracea. A minimally depressed acute fracture of the right orbital floor may also be present. A dedicated maxillofacial CT is recommended for further evaluation. 4. Gas within the extraconal right orbit and within the right periorbital soft tissues. 5. Additional gas along the posterolateral wall the right maxillary sinus and within the right face soft tissues. 6. Redemonstrated subacute comminuted and depressed fracture through the outer table of the left frontal calvarium with extension to the left orbital roof. 7. Paranasal sinus disease, as described. CT cervical spine: 1. Acute, mildly displaced fracture of the C4 spinous process. 2. There is 2 mm C3-C4 grade 1 retrolisthesis, progressed from the recent prior cervical  spine CT of 07/31/2020. A small amount of gas is now present within the C3-C4 disc. Additionally, there is subtle widening at the anterior aspect of the C3-C4 and C4-C5 disc spaces as compared to the prior CT. An MRI of the cervical spine is recommended to evaluate for possible ligamentous injury. 3. Cervical spondylosis, as described. Electronically Signed   By: Kellie Simmering DO   On: 08/09/2020 11:21   MR Cervical Spine Wo Contrast  Result Date: 08/09/2020 CLINICAL DATA:  Fall. Abnormal cervical spine CT. C4 spinous process fracture. EXAM: MRI CERVICAL SPINE WITHOUT CONTRAST TECHNIQUE: Multiplanar, multisequence MR imaging of the cervical spine was performed. No intravenous contrast was administered. COMPARISON:  CT 08/09/2020 FINDINGS: Technical note: Despite efforts by the technologist and patient, significant motion artifact is present on today's exam and could not be eliminated. This reduces exam sensitivity and specificity. Alignment: Trace retrolisthesis of C3 on C4. Mild asymmetric widening of the C3-4 disc space anteriorly with high T1/T2 signal within the central and posterior aspects of the disc, suggestive of blood product. Exaggerated cervical lordosis. Vertebrae: Known C4 spinous process fracture is not well seen on motion degraded MR images. No additional fracture is identified on motion degraded images. No vertebral body bone marrow edema. Vertebral body heights are maintained. Cord: Motion degraded. Increased cord signal at the C3-4 and C4-5 levels cannot be excluded. Posterior Fossa, vertebral arteries, paraspinal tissues: There is disruption of the anterior longitudinal ligament between the C3-4 and C4-5 levels (series 5, image 8) with a small amount of prevertebral fluid extending from the C1-2 to C4-5 levels measuring approximately 5 mm in AP thickness. Interspinous ligament edema at C3-4 and C4-5. Disc levels: C2-C3: Motion degraded. No evidence of disc protrusion. No appreciable foraminal  or canal stenosis. C3-C4: Motion degraded. Slight retrolisthesis and posterior disc osteophyte complex result and at least moderate canal stenosis. Right greater than left foraminal stenosis. C4-C5: Motion degraded. Disc osteophyte complex and uncovertebral spurring result in at least moderate canal stenosis and at least mild right foraminal stenosis. C5-C6: Motion degraded. Suspect small disc osteophyte complex. There may be mild foraminal and canal stenosis at this level. C6-C7: Motion degraded. No evidence  of high-grade foraminal or canal stenosis. C7-T1: Motion degraded. No evidence of high-grade canal or foraminal stenosis. IMPRESSION: 1. Significantly motion degraded study. 2. Findings of cervical hyperextension injury with disruption of the C3-4 disc space including a small amount of hemorrhage within the C3-4 disc. There is disruption of the anterior longitudinal ligament between the C3-4 and C4-5 levels with small amount of prevertebral fluid/hemorrhage. Interspinous ligament injury at C3-4 and C4-5. 3. Cord edema/contusion cannot be excluded on motion degraded images. 4. C4 spinous process fracture, better seen on recent CT. No additional fractures are identified on this study. 5. Degenerative changes of the cervical spine appear most pronounced at the C3-4 and C4-5 levels where there is at least moderate canal stenosis. These results will be called to the ordering clinician or representative by the Radiologist Assistant, and communication documented in the PACS or [employer]. Electronically Signed   By: Davina Poke D.O.   On: 08/09/2020 20:27   CT Maxillofacial WO CM  Result Date: 08/09/2020 CLINICAL DATA:  Facial trauma. Dizziness in fall with contusion to the right forehead. EXAM: CT MAXILLOFACIAL WITHOUT CONTRAST TECHNIQUE: Multidetector CT imaging of the maxillofacial structures was performed. Multiplanar CT image reconstructions were also generated. COMPARISON:  07/31/2020 FINDINGS:  Osseous: Mildly depressed fractures of the left anterior frontal bone involving the outer table of the left frontal sinus. Fracture lines extend to the superior left orbital rim. Mildly depressed right medial, lateral, and inferior orbital rim fractures. Cortical irregularity in the anterior and lateral right maxillary antral wall likely represents nondisplaced fractures. The left maxillary antral walls, nasal bones, zygomatic arches, pterygoid plates, mandibles, and temporomandibular joints appear intact. Multiple prior dental extractions. Orbits: Moderate right periorbital soft tissue hematoma with soft tissue gas inferior to the right orbit and extraconal gas in the right orbit. Globes and extraocular muscles appear intact and symmetrical. No intraconal infiltration or gas. Sinuses: Mucosal thickening in the paranasal sinuses. No acute air-fluid levels. Mastoid air cells are clear. Soft tissues: Periorbital and right facial soft tissue swelling and gas as discussed above. Gas extends along the right face inferiorly to the maxillary and upper mandibular region. Small amount of gas in the right masseter space. Limited intracranial: Atrophic changes are suggested. IMPRESSION: 1. Depressed left frontal and superior orbital rim fractures are unchanged since previous study of 07/31/2020. Left periorbital soft tissue swelling is decreased. 2. New fractures of the right orbit and maxillary sinus walls with new right periorbital hematoma, right periorbital and infraorbital soft tissue gas, and extraconal right orbital gas. Electronically Signed   By: Lucienne Capers M.D.   On: 08/09/2020 21:31      Assessment/Plan 80 yo male presenting with suspected UTI after a fall from standing. - SDH, C spine injuries: Neurosurgery following. Continue C collar. MRI poor quality, may need to repeat if agitation improves and patient is able to tolerate a repeat study. Frequent neuro checks. - Facial fractures: ENT consulted, to  see in am - Agitation: 2 doses of Geodon given in ED. Will continue home Seroquel dosing, with prn low dose haldol. EKG in am to monitor QT c. May need to use precedex if agitation persists. - UTI: Empiric cefepime. Appreciate hospitalist recommendations. Continue foley for urinary retention. Home flomax. - HTN: home antihypertensives ordered - Given reported abdominal and back pain with recent fall, will obtain CT chest/abd/pelvis to rule out intraabdominal/intrathoracic injuries. - VTE: SCDs. Chemical DVT ppx on hold in setting of SDH. - Dispo: admit to trauma service, ICU  Michaelle Birks, [facility], Hepatobiliary and Pancreatic Surgery 08/09/20 11:01 PM

## 2020-08-10 ENCOUNTER — Encounter (HOSPITAL_COMMUNITY): Payer: Self-pay

## 2020-08-10 ENCOUNTER — Other Ambulatory Visit: Payer: Self-pay

## 2020-08-10 DIAGNOSIS — F0151 Vascular dementia with behavioral disturbance: Secondary | ICD-10-CM | POA: Diagnosis not present

## 2020-08-10 DIAGNOSIS — S14109A Unspecified injury at unspecified level of cervical spinal cord, initial encounter: Secondary | ICD-10-CM | POA: Diagnosis not present

## 2020-08-10 DIAGNOSIS — L8990 Pressure ulcer of unspecified site, unspecified stage: Secondary | ICD-10-CM | POA: Insufficient documentation

## 2020-08-10 DIAGNOSIS — N390 Urinary tract infection, site not specified: Secondary | ICD-10-CM | POA: Diagnosis not present

## 2020-08-10 DIAGNOSIS — G9341 Metabolic encephalopathy: Secondary | ICD-10-CM | POA: Diagnosis not present

## 2020-08-10 LAB — BASIC METABOLIC PANEL
Anion gap: 8 (ref 5–15)
BUN: 15 mg/dL (ref 8–23)
CO2: 26 mmol/L (ref 22–32)
Calcium: 8.9 mg/dL (ref 8.9–10.3)
Chloride: 102 mmol/L (ref 98–111)
Creatinine, Ser: 0.89 mg/dL (ref 0.61–1.24)
GFR, Estimated: >60 mL/min (ref >60)
Glucose, Bld: 97 mg/dL (ref 70–99)
Potassium: 3.5 mmol/L (ref 3.5–5.1)
Sodium: 136 mmol/L (ref 135–145)

## 2020-08-10 LAB — CBC
HCT: 35.7 % — ABNORMAL LOW (ref 39.0–52.0)
Hemoglobin: 11.7 g/dL — ABNORMAL LOW (ref 13.0–17.0)
MCH: 28.7 pg (ref 26.0–34.0)
MCHC: 32.8 g/dL (ref 30.0–36.0)
MCV: 87.5 fL (ref 80.0–100.0)
Platelets: 297 10*3/uL (ref 150–400)
RBC: 4.08 MIL/uL — ABNORMAL LOW (ref 4.22–5.81)
RDW: 13.7 % (ref 11.5–15.5)
WBC: 11.1 10*3/uL — ABNORMAL HIGH (ref 4.0–10.5)
nRBC: 0 % (ref 0.0–0.2)

## 2020-08-10 LAB — TSH: TSH: 2.019 u[IU]/mL (ref 0.350–4.500)

## 2020-08-10 LAB — GLUCOSE, CAPILLARY
Glucose-Capillary: 107 mg/dL — ABNORMAL HIGH (ref 70–99)
Glucose-Capillary: 112 mg/dL — ABNORMAL HIGH (ref 70–99)
Glucose-Capillary: 127 mg/dL — ABNORMAL HIGH (ref 70–99)
Glucose-Capillary: 128 mg/dL — ABNORMAL HIGH (ref 70–99)
Glucose-Capillary: 98 mg/dL (ref 70–99)

## 2020-08-10 LAB — MRSA NEXT GEN BY PCR, NASAL: MRSA by PCR Next Gen: NOT DETECTED

## 2020-08-10 LAB — URINE CULTURE: Culture: 10000 — AB

## 2020-08-10 MED ORDER — CHLORHEXIDINE GLUCONATE CLOTH 2 % EX PADS
6.0000 | MEDICATED_PAD | Freq: Every day | CUTANEOUS | Status: DC
  Administered 2020-08-10: 6 via TOPICAL

## 2020-08-10 MED ORDER — PANTOPRAZOLE SODIUM 40 MG IV SOLR: 40.0000 mg | INTRAVENOUS | Status: DC

## 2020-08-10 MED ORDER — LORAZEPAM 2 MG/ML IJ SOLN
0.5000 mg | INTRAMUSCULAR | Status: DC | PRN
  Administered 2020-08-10 (×3): 1 mg via INTRAMUSCULAR
  Filled 2020-08-10 (×3): qty 1

## 2020-08-10 MED ORDER — PANTOPRAZOLE SODIUM 40 MG PO TBEC: 40.0000 mg | DELAYED_RELEASE_TABLET | Freq: Every day | ORAL | Status: DC

## 2020-08-10 MED ORDER — CHLORHEXIDINE GLUCONATE CLOTH 2 % EX PADS
6.0000 | MEDICATED_PAD | Freq: Every day | CUTANEOUS | Status: DC
  Administered 2020-08-11: 6 via TOPICAL

## 2020-08-10 MED ORDER — MORPHINE SULFATE (PF) 2 MG/ML IV SOLN
2.0000 mg | INTRAVENOUS | Status: DC | PRN
  Administered 2020-08-11: 2 mg via INTRAVENOUS
  Administered 2020-08-11: 4 mg via INTRAVENOUS
  Administered 2020-08-11 (×2): 2 mg via INTRAVENOUS
  Administered 2020-08-12: 4 mg via INTRAVENOUS
  Administered 2020-08-12 (×2): 2 mg via INTRAVENOUS
  Filled 2020-08-10 (×3): qty 1
  Filled 2020-08-10: qty 2
  Filled 2020-08-10: qty 1
  Filled 2020-08-10: qty 2
  Filled 2020-08-10: qty 1

## 2020-08-10 MED ORDER — ZIPRASIDONE MESYLATE 20 MG IM SOLR
5.0000 mg | Freq: Four times a day (QID) | INTRAMUSCULAR | Status: DC | PRN
  Filled 2020-08-10: qty 20

## 2020-08-10 MED ORDER — METOPROLOL TARTRATE 5 MG/5ML IV SOLN: 5.0000 mg | Freq: Four times a day (QID) | INTRAVENOUS | Status: DC | PRN

## 2020-08-10 MED ORDER — ZIPRASIDONE MESYLATE 20 MG IM SOLR
10.0000 mg | Freq: Once | INTRAMUSCULAR | Status: AC
  Administered 2020-08-10: 10 mg via INTRAMUSCULAR
  Filled 2020-08-10: qty 20

## 2020-08-10 MED ORDER — HYDRALAZINE HCL 20 MG/ML IJ SOLN: 10.0000 mg | Freq: Three times a day (TID) | INTRAMUSCULAR | Status: DC | PRN

## 2020-08-10 MED ORDER — LEVETIRACETAM IN NACL 500 MG/100ML IV SOLN
500.0000 mg | Freq: Two times a day (BID) | INTRAVENOUS | Status: DC
  Administered 2020-08-10 – 2020-08-12 (×3): 500 mg via INTRAVENOUS
  Filled 2020-08-10 (×5): qty 100

## 2020-08-10 NOTE — Progress Notes (Signed)
CT chest/abd/pelvis reviewed. No acute traumatic injuries. Incidentally the foley balloon appears to be in the urethra. Bladder is full. Spoke with bedside RN and requested removal of the foley and replacement with a new one.

## 2020-08-10 NOTE — Progress Notes (Signed)
Occupational Therapy Discharge Patient Details Name: Kasai Beltran MRN: [id number] DOB: Oct 11, 1940 Today's Date: 08/10/2020 Time:  -     Patient discharged from OT services secondary to medical decline - will need to re-order OT to resume therapy services.  Please see latest therapy progress note for current level of functioning and progress toward goals.    Progress and discharge plan discussed with patient and/or caregiver:  MD discharged OT orders so after reviewing chart will sign off.  GO     Billey Chang, OTR/L  Acute Rehabilitation Services Pager: [phone] Office: [phone] .  08/10/2020, 10:56 AM

## 2020-08-10 NOTE — Progress Notes (Signed)
ICU Transitions of Care Pharmacist Intervention Transitions of care were discussed with  and the following interventions were made.   Antipsychotic / Anticonvulsant was initiated during ICU admission and not intended to be continued at discharge. Con't PRN   Opiate(s) were initiated during ICU admission and not intended to be continued at discharge.  N/A  Benzodiazepine(s) were initiated during ICU admission and not intended to be continued at discharge.  N/A  Stress Ulcer Prophylaxis was initiated during ICU admission and not intended to be continued at discharge. IV>po PPI.  Antibiotic(s) were initiated during ICU admission and not intended to be continued at discharge.  Continue abx pending urine cultures.  Steroids were initiated during ICU admission and not intended to be continued at discharge.  No orders  Patrick Phillips S. Alford Highland, PharmD, BCPS Clinical Staff Pharmacist [email or web address]

## 2020-08-10 NOTE — Progress Notes (Signed)
SLP Cancellation Note  Patient Details Name: Patrick Phillips MRN: [id number] DOB: 11/10/1940   Cancelled treatment:       Reason Eval/Treat Not Completed: Orders discontinued due to decline in pt status. Please reconsult if needs arise.   Mackson Botz B. Quentin Ore, [facility], [place] Speech Language Pathologist Office: [phone] Pager: [phone]  Shonna Chock 08/10/2020, 1:15 PM

## 2020-08-10 NOTE — Consult Note (Signed)
H&P Facial Trauma  Exam Date: 08/09/2020  ID:  80 y.o. patient referred by ED or evaluation of facial trauma.  Chief Complaint:  Facial trauma.  History of Present Illness:  This is a 80 y.o. patient who was in a fall on 08/09/20 -- he resides in a SNF and this is second fall within the past month. He sustained left orbital fractures from his previous fall and again hit his face and on workup he was found to have right orbital fractures and peiorbital soft tissue edema/eccymosis.  He also has sustained a SDH and c-spine injuries and is admitted to trauma surgery.    Patient was seen in ICU however he has been delerious/disoriented and agitated/combative and had been given sedation prior to exam this morning. He was not able to participate in a thorough exam. Per ED physician last night his extraocular movements were intact with no visual deficits.  Clinical Exam:          Patient is sedated, AAO x 0, c-collar in place          Edema and ecchymosis of the bilateral periorbital regions with multiple abrasions.          No facial lacerations           Extraocular movements unable to be assessed          Pupils equal and reactive          Sub conjunctival hemorrhage present          Orbital rims intact, no proptosis          Intercanthal distance wnl          No otorrhea          Battle's sign not present          Nose without crepitus          Nose midline          Septal hematoma not present.  No septal deviation          Maxilla stable          Partially edentulous, no fractured teeth          Bony step-offs were note noted on the mandible          Active bleeding not present          CN II-XII unable to be completely assessed    Radiographic Exam:          Maxillofacial CT reveals minimally displaced fractures of the right orbital rim/floor and non-displaced fracture of the right maxillary sinus. Previous left anterior table frontal sinus fractures also evident.            Assessment/Plan:  59 yoM s\p trauma with facial fractures including right minimally-displaced orbital floor and right maxillary sinus fractures.  No facial lacerations. Operative treatment of these fractures is not indicated.  -Recommend sinus precautions x 2 weeks -Ophthalmic ointment prn -Follow up with PCP, please call/text with any questions or concerns [place], DDS

## 2020-08-10 NOTE — Progress Notes (Signed)
Trauma/Critical Care Follow Up Note  Subjective:    Overnight Issues:   Objective:  Vital signs for last 24 hours: Temp:  [97.7 F (36.5 C)-97.9 F (36.6 C)] 97.9 F (36.6 C) (06/14 0300) Pulse Rate:  [60-93] 69 (06/14 0700) Resp:  [16-22] 16 (06/14 0700) BP: (145-210)/(45-114) 153/70 (06/14 0700) SpO2:  [92 %-100 %] 96 % (06/14 0700) Weight:  [71.5 kg] 71.5 kg (06/14 0014)  Hemodynamic parameters for last 24 hours:    Intake/Output from previous day: 06/13 0701 - 06/14 0700 In: 1196 [I.V.:996.2; IV Piggyback:199.9] Out: 2925 [Urine:2925]  Intake/Output this shift: No intake/output data recorded.  Vent settings for last 24 hours:    Physical Exam:  Gen: comfortable, no distress Neuro: not following commands, incoherent speech HEENT: PERRL Neck: supple CV: RRR Pulm: unlabored breathing Abd: soft, NT GU: clear yellow urine, foley Extr: wwp, no edema   Results for orders placed or performed during the hospital encounter of 08/09/20 (from the past 24 hour(s))  CBC with Differential/Platelet     Status: Abnormal   Collection Time: 08/09/20 11:05 AM  Result Value Ref Range   WBC 11.7 (H) 4.0 - 10.5 K/uL   RBC 3.76 (L) 4.22 - 5.81 MIL/uL   Hemoglobin 10.9 (L) 13.0 - 17.0 g/dL   HCT 33.4 (L) 39.0 - 52.0 %   MCV 88.8 80.0 - 100.0 fL   MCH 29.0 26.0 - 34.0 pg   MCHC 32.6 30.0 - 36.0 g/dL   RDW 13.6 11.5 - 15.5 %   Platelets 302 150 - 400 K/uL   nRBC 0.0 0.0 - 0.2 %   Neutrophils Relative % 82 %   Neutro Abs 9.5 (H) 1.7 - 7.7 K/uL   Lymphocytes Relative 11 %   Lymphs Abs 1.3 0.7 - 4.0 K/uL   Monocytes Relative 7 %   Monocytes Absolute 0.8 0.1 - 1.0 K/uL   Eosinophils Relative 0 %   Eosinophils Absolute 0.0 0.0 - 0.5 K/uL   Basophils Relative 0 %   Basophils Absolute 0.0 0.0 - 0.1 K/uL   Immature Granulocytes 0 %   Abs Immature Granulocytes 0.05 0.00 - 0.07 K/uL  Comprehensive metabolic panel     Status: Abnormal   Collection Time: 08/09/20 11:05 AM   Result Value Ref Range   Sodium 134 (L) 135 - 145 mmol/L   Potassium 4.0 3.5 - 5.1 mmol/L   Chloride 99 98 - 111 mmol/L   CO2 26 22 - 32 mmol/L   Glucose, Bld 169 (H) 70 - 99 mg/dL   BUN 25 (H) 8 - 23 mg/dL   Creatinine, Ser 0.96 0.61 - 1.24 mg/dL   Calcium 9.0 8.9 - 10.3 mg/dL   Total Protein 5.5 (L) 6.5 - 8.1 g/dL   Albumin 2.9 (L) 3.5 - 5.0 g/dL   AST 28 15 - 41 U/L   ALT 28 0 - 44 U/L   Alkaline Phosphatase 43 38 - 126 U/L   Total Bilirubin 0.8 0.3 - 1.2 mg/dL   GFR, Estimated >60 >60 mL/min   Anion gap 9 5 - 15  Urinalysis, Routine w reflex microscopic Urine, Bag (ped)     Status: Abnormal   Collection Time: 08/09/20  3:00 PM  Result Value Ref Range   Color, Urine YELLOW YELLOW   APPearance TURBID (A) CLEAR   Specific Gravity, Urine 1.018 1.005 - 1.030   pH 5.0 5.0 - 8.0   Glucose, UA NEGATIVE NEGATIVE mg/dL   Hgb urine dipstick MODERATE (A)  NEGATIVE   Bilirubin Urine NEGATIVE NEGATIVE   Ketones, ur 5 (A) NEGATIVE mg/dL   Protein, ur 30 (A) NEGATIVE mg/dL   Nitrite NEGATIVE NEGATIVE   Leukocytes,Ua LARGE (A) NEGATIVE   RBC / HPF 21-50 0 - 5 RBC/hpf   WBC, UA >50 (H) 0 - 5 WBC/hpf   Bacteria, UA RARE (A) NONE SEEN   Mucus PRESENT    Budding Yeast PRESENT    Hyaline Casts, UA PRESENT   Ammonia     Status: None   Collection Time: 08/09/20  4:45 PM  Result Value Ref Range   Ammonia 24 9 - 35 umol/L  Protime-INR     Status: None   Collection Time: 08/09/20  4:45 PM  Result Value Ref Range   Prothrombin Time 13.7 11.4 - 15.2 seconds   INR 1.0 0.8 - 1.2  MRSA Next Gen by PCR, Nasal     Status: None   Collection Time: 08/10/20 12:13 AM  Result Value Ref Range   MRSA by PCR Next Gen NOT DETECTED NOT DETECTED  Glucose, capillary     Status: Abnormal   Collection Time: 08/10/20 12:47 AM  Result Value Ref Range   Glucose-Capillary 128 (H) 70 - 99 mg/dL  TSH     Status: None   Collection Time: 08/10/20  1:53 AM  Result Value Ref Range   TSH 2.019 0.350 - 4.500  uIU/mL  CBC     Status: Abnormal   Collection Time: 08/10/20  1:53 AM  Result Value Ref Range   WBC 11.1 (H) 4.0 - 10.5 K/uL   RBC 4.08 (L) 4.22 - 5.81 MIL/uL   Hemoglobin 11.7 (L) 13.0 - 17.0 g/dL   HCT 35.7 (L) 39.0 - 52.0 %   MCV 87.5 80.0 - 100.0 fL   MCH 28.7 26.0 - 34.0 pg   MCHC 32.8 30.0 - 36.0 g/dL   RDW 13.7 11.5 - 15.5 %   Platelets 297 150 - 400 K/uL   nRBC 0.0 0.0 - 0.2 %  Basic metabolic panel     Status: None   Collection Time: 08/10/20  1:53 AM  Result Value Ref Range   Sodium 136 135 - 145 mmol/L   Potassium 3.5 3.5 - 5.1 mmol/L   Chloride 102 98 - 111 mmol/L   CO2 26 22 - 32 mmol/L   Glucose, Bld 97 70 - 99 mg/dL   BUN 15 8 - 23 mg/dL   Creatinine, Ser 0.89 0.61 - 1.24 mg/dL   Calcium 8.9 8.9 - 10.3 mg/dL   GFR, Estimated >60 >60 mL/min   Anion gap 8 5 - 15  Glucose, capillary     Status: None   Collection Time: 08/10/20  4:12 AM  Result Value Ref Range   Glucose-Capillary 98 70 - 99 mg/dL    Assessment & Plan:  Present on Admission:  Acute metabolic encephalopathy  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HCC)  Vascular dementia (HCC)  SDH (subdural hematoma) (HCC)  Cervical spinal cord injury, initial encounter (HCC)  Hypertension  (Resolved) Acute lower UTI  Right orbital fracture, closed, initial encounter ([place])  Urinary tract infection associated with catheterization of urinary tract, initial encounter ([place])  Subdural hematoma ([place])    LOS: 1 day   Additional comments:I reviewed the patient's new clinical lab test results.   and I reviewed the patients new imaging test results.    FFS  SDH - NSGY c/s, Dr. Ellene Route, keppra x7d for sz ppx, no f/u CT recommended C4  SP fx - NSGY c/s, Dr. Ellene Route, okay to remove c-collar, MRI  poor quality Facial fractures - ENT c/s, Dr. Conley Simmonds, recs for sinus precautions x2w Agitation - good response to geodon x2, will add prn UTI - empiric cefepime. Continue foley for urinary retention.  HTN - prn  antihypertensives ordered FEN - CLD and okay to have ice cream DVT - SCDs, hold LMWH Dispo - transfer to 6N, comfort care  Lengthy conversation with daughter, [name], and son, TW, regarding patient's clinical condition and high risk of aspiration. We discussed cortrak placement for feeds/meds and this was vehemently declined. Discussed transitioning to comfort care and both are in agreement. Daughter requests to allow him to have ice cream and water for comfort. I clearly expressed that allowing PO intake puts him at extremely high risk for aspiration and worsening of his clinical condition, but also that I am in agreement with allowing PO intake for comfort. They both verbalize understanding and wish to proceed. Palliative care has been consulted and I discussed that the conversation with palliative will include a discussion of the option to transition to hospice. They both verbalized understanding.  Critical Care Total Time: 85 minutes  Jesusita Oka, MD Trauma & General Surgery Please use [email or web address] to contact on call provider  08/10/2020  *Care during the described time interval was provided by me. I have reviewed this patient's available data, including medical history, events of note, physical examination and test results as part of my evaluation.

## 2020-08-10 NOTE — Progress Notes (Signed)
PROGRESS NOTE  Patrick Phillips DGU:[id number] DOB: 23-Jun-1940 DOA: 08/09/2020 PCP: [name], MD  HPI/Recap of past 24 hours: HPI from Dr [name] is an 80 y.o. male with h/o Vascular dementia, HTN, DM2. Pt has h/o severe multiple episodes of delirium in past, with multiple admissions. Pt admitted in April for Delirium determined to be secondary to COVID-19 admitted again in May for delirium secondary to colitis.  During that admit had urinary retention, foley catheter placed. Fall and facial trauma (L orbital fx) on 6/4. Delirium due to UTI on 6/6, given IM rocephin in ED. Pt now presents to ED after fall at SNF.  Apparently got up, fell, hit head.  Pt with delirium and agitation.  Pt unable to provide any additional history. Daughter at bedside: pt gets very agitated like this whenever he has an infection of any sort. In the ED, work up reveals possible UTI, small traumatic SDH, new R orbital Fx, old L orbital fx unchanged, cervical spine bony and ligamentous injury, cant rule out cord edema. Pt required multiple rounds of Geodon for sedation in the ED. Hospitalist consulted to assist in further management.     Today, met patient sleeping in bed, appears to be sedated.  Looked comfortable. Noted discussion with attending and patient's daughter, both in agreement for comfort care measures/hospice.  Palliative/hospice care consulted.  Assessment/Plan: Principal Problem:   Cervical spinal cord injury, initial encounter [facility]) Active Problems:   Hypertension   Type 2 diabetes mellitus (HCC)   Acute metabolic encephalopathy   Vascular dementia ([place])   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HCC)   SDH (subdural hematoma) (HCC)   Right orbital fracture, closed, initial encounter ([place])   Fracture of orbit, left, closed, with routine healing, subsequent encounter   Urinary tract infection associated with catheterization of urinary tract, initial encounter ([place])   Subdural hematoma  (HCC)   Pressure injury of skin   Delirium with history of vascular dementia Multifactorial, likely exacerbated due to possible bladder distention with malpositioned Foley (hx of urinary retention) Vs possible UTI Vs multiple fracture Foley catheter replaced Delirium precautions Continue Geodon, Ativan prn IVF  UTI UA with large leukocytes, >50 UC pending Continue cefepime  Diabetes mellitus type 2 SSI, Accu-Cheks, hypoglycemic protocol  Hypertension BP stable Continue to hold p.o. meds for now, until more awake,  as needed IV meds  Cervical spinal cord injury Continue cervical hard collar Further management per NS  Right orbital fracture History of left orbital fracture-no new changes on CT For management per trauma ENT  Small traumatic SDH Further management per NS  Goals of care discussion Conversations between daughter and attending MD were held, family agreeable to comfort care measures Patient is currently DNR Hospice/palliative care team consulted     Estimated body mass index is 21.38 kg/m as calculated from the following:   Height as of this encounter: 6' (1.829 m).   Weight as of this encounter: 71.5 kg.     Code Status: DNR  Family Communication: None at bedside  Disposition Plan: Status is: Inpatient  Remains inpatient appropriate because:Inpatient level of care appropriate due to severity of illness  Dispo: The patient is from: SNF              Anticipated d/c is to: SNF              Patient currently is not medically stable to d/c.   Difficult to place patient No   Consultants: [facility] Neurosurgery  Trauma ENT  Procedures: None  Antimicrobials: Cefepime  DVT prophylaxis: SCD, due to SDH   Objective: Vitals:   08/10/20 0900 08/10/20 1000 08/10/20 1100 08/10/20 1200  BP: (!) 159/61 132/66 (!) 147/60   Pulse: 80 77 73 (!) 105  Resp: '16 17 16 20  ' Temp:      TempSrc:      SpO2: 91% 95% 98% 96%  Weight:       Height:        Intake/Output Summary (Last 24 hours) at 08/10/2020 1432 Last data filed at 08/10/2020 1200 Gross per 24 hour  Intake 1711.76 ml  Output 3450 ml  Net -1738.24 ml   Filed Weights   08/10/20 0014  Weight: 71.5 kg    Exam: General: NAD, sedated, bruising noted on face Cardiovascular: S1, S2 present Respiratory: CTAB Abdomen: Soft, nontender, nondistended, bowel sounds present Musculoskeletal: No bilateral pedal edema noted Skin: Normal Psychiatry: Unable to assess   Data Reviewed: CBC: Recent Labs  Lab 08/09/20 1105 08/10/20 0153  WBC 11.7* 11.1*  NEUTROABS 9.5*  --   HGB 10.9* 11.7*  HCT 33.4* 35.7*  MCV 88.8 87.5  PLT 302 518   Basic Metabolic Panel: Recent Labs  Lab 08/09/20 1105 08/10/20 0153  NA 134* 136  K 4.0 3.5  CL 99 102  CO2 26 26  GLUCOSE 169* 97  BUN 25* 15  CREATININE 0.96 0.89  CALCIUM 9.0 8.9   GFR: Estimated Creatinine Clearance: 68.1 mL/min (by C-G formula based on SCr of 0.89 mg/dL). Liver Function Tests: Recent Labs  Lab 08/09/20 1105  AST 28  ALT 28  ALKPHOS 43  BILITOT 0.8  PROT 5.5*  ALBUMIN 2.9*   No results for input(s): LIPASE, AMYLASE in the last 168 hours. Recent Labs  Lab 08/09/20 1645  AMMONIA 24   Coagulation Profile: Recent Labs  Lab 08/09/20 1645  INR 1.0   Cardiac Enzymes: No results for input(s): CKTOTAL, CKMB, CKMBINDEX, TROPONINI in the last 168 hours. BNP (last 3 results) No results for input(s): PROBNP in the last 8760 hours. HbA1C: No results for input(s): HGBA1C in the last 72 hours. CBG: Recent Labs  Lab 08/10/20 0047 08/10/20 0412 08/10/20 0759 08/10/20 1238  GLUCAP 128* 98 112* 127*   Lipid Profile: No results for input(s): CHOL, HDL, LDLCALC, TRIG, CHOLHDL, LDLDIRECT in the last 72 hours. Thyroid Function Tests: Recent Labs    08/10/20 0153  TSH 2.019   Anemia Panel: No results for input(s): VITAMINB12, FOLATE, FERRITIN, TIBC, IRON, RETICCTPCT in the last 72  hours. Urine analysis:    Component Value Date/Time   COLORURINE YELLOW 08/09/2020 1500   APPEARANCEUR TURBID (A) 08/09/2020 1500   LABSPEC 1.018 08/09/2020 1500   PHURINE 5.0 08/09/2020 1500   GLUCOSEU NEGATIVE 08/09/2020 1500   HGBUR MODERATE (A) 08/09/2020 1500   BILIRUBINUR NEGATIVE 08/09/2020 1500   KETONESUR 5 (A) 08/09/2020 1500   PROTEINUR 30 (A) 08/09/2020 1500   NITRITE NEGATIVE 08/09/2020 1500   LEUKOCYTESUR LARGE (A) 08/09/2020 1500   Sepsis Labs: '@LABRCNTIP' (procalcitonin:4,lacticidven:4)  ) Recent Results (from the past 240 hour(s))  Resp Panel by RT-PCR (Flu A&B, Covid) Nasopharyngeal Swab     Status: None   Collection Time: 08/02/20  4:23 PM   Specimen: Nasopharyngeal Swab; Nasopharyngeal(NP) swabs in vial transport medium  Result Value Ref Range Status   SARS Coronavirus 2 by RT PCR NEGATIVE NEGATIVE Final    Comment: (NOTE) SARS-CoV-2 target nucleic acids are NOT DETECTED.  The SARS-CoV-2 RNA is  generally detectable in upper respiratory specimens during the acute phase of infection. The lowest concentration of SARS-CoV-2 viral copies this assay can detect is 138 copies/mL. A negative result does not preclude SARS-Cov-2 infection and should not be used as the sole basis for treatment or other patient management decisions. A negative result may occur with  improper specimen collection/handling, submission of specimen other than nasopharyngeal swab, presence of viral mutation(s) within the areas targeted by this assay, and inadequate number of viral copies(<138 copies/mL). A negative result must be combined with clinical observations, patient history, and epidemiological information. The expected result is Negative.  Fact Sheet for Patients:  EntrepreneurPulse.com.au  Fact Sheet for Healthcare Providers:  IncredibleEmployment.be  This test is no t yet approved or cleared by the [place] FDA and  has been authorized  for detection and/or diagnosis of SARS-CoV-2 by FDA under an Emergency Use Authorization (EUA). This EUA will remain  in effect (meaning this test can be used) for the duration of the COVID-19 declaration under Section 564(b)(1) of the Act, 21 U.S.C.section 360bbb-3(b)(1), unless the authorization is terminated  or revoked sooner.       Influenza A by PCR NEGATIVE NEGATIVE Final   Influenza B by PCR NEGATIVE NEGATIVE Final    Comment: (NOTE) The Xpert Xpress SARS-CoV-2/FLU/RSV plus assay is intended as an aid in the diagnosis of influenza from Nasopharyngeal swab specimens and should not be used as a sole basis for treatment. Nasal washings and aspirates are unacceptable for Xpert Xpress SARS-CoV-2/FLU/RSV testing.  Fact Sheet for Patients: EntrepreneurPulse.com.au  Fact Sheet for Healthcare Providers: IncredibleEmployment.be  This test is not yet approved or cleared by the [place] FDA and has been authorized for detection and/or diagnosis of SARS-CoV-2 by FDA under an Emergency Use Authorization (EUA). This EUA will remain in effect (meaning this test can be used) for the duration of the COVID-19 declaration under Section 564(b)(1) of the Act, 21 U.S.C. section 360bbb-3(b)(1), unless the authorization is terminated or revoked.  Performed at [facility], [place] [address] [place], [place] [address]   Urine culture     Status: Abnormal   Collection Time: 08/09/20  3:55 PM   Specimen: Urine, Random  Result Value Ref Range Status   Specimen Description URINE, RANDOM  Final   Special Requests   Final    NONE Performed at [facility], [place] [address] [place], [place] [address]    Culture 10,000 COLONIES/mL YEAST (A)  Final   Report Status 08/10/2020 FINAL  Final  MRSA Next Gen by PCR, Nasal     Status: None   Collection Time: 08/10/20 12:13 AM  Result Value Ref Range Status   MRSA by PCR Next Gen NOT DETECTED NOT  DETECTED Final    Comment: (NOTE) The GeneXpert MRSA Assay (FDA approved for NASAL specimens only), is one component of a comprehensive MRSA colonization surveillance program. It is not intended to diagnose MRSA infection nor to guide or monitor treatment for MRSA infections. Test performance is not FDA approved in patients less than 33 years old. Performed at [facility], [place] [address], [place],  [address]       Studies: MR Cervical Spine Wo Contrast  Result Date: 08/09/2020 CLINICAL DATA:  Fall. Abnormal cervical spine CT. C4 spinous process fracture. EXAM: MRI CERVICAL SPINE WITHOUT CONTRAST TECHNIQUE: Multiplanar, multisequence MR imaging of the cervical spine was performed. No intravenous contrast was administered. COMPARISON:  CT 08/09/2020 FINDINGS: Technical note: Despite  efforts by the technologist and patient, significant motion artifact is present on today's exam and could not be eliminated. This reduces exam sensitivity and specificity. Alignment: Trace retrolisthesis of C3 on C4. Mild asymmetric widening of the C3-4 disc space anteriorly with high T1/T2 signal within the central and posterior aspects of the disc, suggestive of blood product. Exaggerated cervical lordosis. Vertebrae: Known C4 spinous process fracture is not well seen on motion degraded MR images. No additional fracture is identified on motion degraded images. No vertebral body bone marrow edema. Vertebral body heights are maintained. Cord: Motion degraded. Increased cord signal at the C3-4 and C4-5 levels cannot be excluded. Posterior Fossa, vertebral arteries, paraspinal tissues: There is disruption of the anterior longitudinal ligament between the C3-4 and C4-5 levels (series 5, image 8) with a small amount of prevertebral fluid extending from the C1-2 to C4-5 levels measuring approximately 5 mm in AP thickness. Interspinous ligament edema at C3-4 and C4-5. Disc levels: C2-C3: Motion degraded. No evidence  of disc protrusion. No appreciable foraminal or canal stenosis. C3-C4: Motion degraded. Slight retrolisthesis and posterior disc osteophyte complex result and at least moderate canal stenosis. Right greater than left foraminal stenosis. C4-C5: Motion degraded. Disc osteophyte complex and uncovertebral spurring result in at least moderate canal stenosis and at least mild right foraminal stenosis. C5-C6: Motion degraded. Suspect small disc osteophyte complex. There may be mild foraminal and canal stenosis at this level. C6-C7: Motion degraded. No evidence of high-grade foraminal or canal stenosis. C7-T1: Motion degraded. No evidence of high-grade canal or foraminal stenosis. IMPRESSION: 1. Significantly motion degraded study. 2. Findings of cervical hyperextension injury with disruption of the C3-4 disc space including a small amount of hemorrhage within the C3-4 disc. There is disruption of the anterior longitudinal ligament between the C3-4 and C4-5 levels with small amount of prevertebral fluid/hemorrhage. Interspinous ligament injury at C3-4 and C4-5. 3. Cord edema/contusion cannot be excluded on motion degraded images. 4. C4 spinous process fracture, better seen on recent CT. No additional fractures are identified on this study. 5. Degenerative changes of the cervical spine appear most pronounced at the C3-4 and C4-5 levels where there is at least moderate canal stenosis. These results will be called to the ordering clinician or representative by the Radiologist Assistant, and communication documented in the PACS or [employer]. Electronically Signed   By: Davina Poke D.O.   On: 08/09/2020 20:27   CT CHEST ABDOMEN PELVIS W CONTRAST  Result Date: 08/09/2020 CLINICAL DATA:  Fall, abdomen and back pain. EXAM: CT CHEST, ABDOMEN, AND PELVIS WITH CONTRAST TECHNIQUE: Multidetector CT imaging of the chest, abdomen and pelvis was performed following the standard protocol during bolus administration of  intravenous contrast. CONTRAST:  161m OMNIPAQUE IOHEXOL 300 MG/ML  SOLN COMPARISON:  07/21/2020 FINDINGS: CT CHEST FINDINGS Cardiovascular: Coronary artery and aortic calcifications. Heart is normal size. Aorta is normal caliber. Mediastinum/Nodes: No mediastinal, hilar, or axillary adenopathy. Trachea and esophagus are unremarkable. Thyroid unremarkable. Lungs/Pleura: Dependent atelectasis. No confluent opacities or pneumothorax. Musculoskeletal: Chest wall soft tissues are unremarkable. No acute bony abnormality. CT ABDOMEN PELVIS FINDINGS Hepatobiliary: No hepatic injury or perihepatic hematoma. Gallbladder is unremarkable Pancreas: No focal abnormality or ductal dilatation. Spleen: No splenic injury or perisplenic hematoma. Adrenals/Urinary Tract: No adrenal hemorrhage or renal injury identified. Bladder is unremarkable. Bilateral renal cysts, the largest in the midpole of the right kidney measuring 4.1 cm. No hydronephrosis. Stomach/Bowel: Stomach, large and small bowel grossly unremarkable. Vascular/Lymphatic: Aortic atherosclerosis. No evidence of aneurysm or  adenopathy. Reproductive: Prostate enlargement. Other: No free fluid or free air. Musculoskeletal: No acute bony abnormality. IMPRESSION: No evidence of significant traumatic injury in the chest, abdomen or pelvis. Coronary artery disease, aortic atherosclerosis. No acute findings in the chest, abdomen or pelvis. Electronically Signed   By: Rolm Baptise M.D.   On: 08/09/2020 23:47   CT Maxillofacial WO CM  Result Date: 08/09/2020 CLINICAL DATA:  Facial trauma. Dizziness in fall with contusion to the right forehead. EXAM: CT MAXILLOFACIAL WITHOUT CONTRAST TECHNIQUE: Multidetector CT imaging of the maxillofacial structures was performed. Multiplanar CT image reconstructions were also generated. COMPARISON:  07/31/2020 FINDINGS: Osseous: Mildly depressed fractures of the left anterior frontal bone involving the outer table of the left frontal sinus.  Fracture lines extend to the superior left orbital rim. Mildly depressed right medial, lateral, and inferior orbital rim fractures. Cortical irregularity in the anterior and lateral right maxillary antral wall likely represents nondisplaced fractures. The left maxillary antral walls, nasal bones, zygomatic arches, pterygoid plates, mandibles, and temporomandibular joints appear intact. Multiple prior dental extractions. Orbits: Moderate right periorbital soft tissue hematoma with soft tissue gas inferior to the right orbit and extraconal gas in the right orbit. Globes and extraocular muscles appear intact and symmetrical. No intraconal infiltration or gas. Sinuses: Mucosal thickening in the paranasal sinuses. No acute air-fluid levels. Mastoid air cells are clear. Soft tissues: Periorbital and right facial soft tissue swelling and gas as discussed above. Gas extends along the right face inferiorly to the maxillary and upper mandibular region. Small amount of gas in the right masseter space. Limited intracranial: Atrophic changes are suggested. IMPRESSION: 1. Depressed left frontal and superior orbital rim fractures are unchanged since previous study of 07/31/2020. Left periorbital soft tissue swelling is decreased. 2. New fractures of the right orbit and maxillary sinus walls with new right periorbital hematoma, right periorbital and infraorbital soft tissue gas, and extraconal right orbital gas. Electronically Signed   By: Lucienne Capers M.D.   On: 08/09/2020 21:31    Scheduled Meds:  [START ON 08/11/2020] Chlorhexidine Gluconate Cloth  6 each Topical Daily   insulin aspart  0-9 Units Subcutaneous Q4H   pantoprazole  40 mg Oral Q1200    Continuous Infusions:  sodium chloride 75 mL/hr at 08/10/20 1200   ceFEPime (MAXIPIME) IV Stopped (08/10/20 8315)   levETIRAcetam Stopped (08/10/20 1026)     LOS: 1 day     Alma Friendly, MD [facility]  If 7PM-7AM, please contact  night-coverage [email or web address] 08/10/2020, 2:32 PM

## 2020-08-10 NOTE — Plan of Care (Signed)
  Problem: Safety: Goal: Non-violent Restraint(s) Outcome: Progressing   Problem: Education: Goal: Knowledge of General Education information will improve Description: Including pain rating scale, medication(s)/side effects and non-pharmacologic comfort measures Outcome: Progressing   Problem: Health Behavior/Discharge Planning: Goal: Ability to manage health-related needs will improve Outcome: Progressing   Problem: Clinical Measurements: Goal: Ability to maintain clinical measurements within normal limits will improve Outcome: Progressing Goal: Will remain free from infection Outcome: Progressing Goal: Diagnostic test results will improve Outcome: Progressing Goal: Respiratory complications will improve Outcome: Progressing Goal: Cardiovascular complication will be avoided Outcome: Progressing   Problem: Activity: Goal: Risk for activity intolerance will decrease Outcome: Progressing   Problem: Nutrition: Goal: Adequate nutrition will be maintained Outcome: Progressing   Problem: Coping: Goal: Level of anxiety will decrease Outcome: Progressing   Problem: Elimination: Goal: Will not experience complications related to bowel motility Outcome: Progressing Goal: Will not experience complications related to urinary retention Outcome: Progressing   Problem: Pain Managment: Goal: General experience of comfort will improve Outcome: Progressing   Problem: Safety: Goal: Ability to remain free from injury will improve Outcome: Progressing   Problem: Skin Integrity: Goal: Risk for impaired skin integrity will decrease Outcome: Progressing   Problem: Education: Goal: Knowledge of the prescribed therapeutic regimen Outcome: Progressing Goal: Knowledge of disease or condition will improve Outcome: Progressing   Problem: Clinical Measurements: Goal: Neurologic status will improve Outcome: Progressing   Problem: Tissue Perfusion: Goal: Ability to maintain  intracranial pressure will improve Outcome: Progressing   Problem: Respiratory: Goal: Will regain and/or maintain adequate ventilation Outcome: Progressing   Problem: Skin Integrity: Goal: Risk for impaired skin integrity will decrease Outcome: Progressing Goal: Demonstration of wound healing without infection will improve Outcome: Progressing   Problem: Psychosocial: Goal: Ability to verbalize positive feelings about self will improve Outcome: Progressing Goal: Ability to participate in self-care as condition permits will improve Outcome: Progressing Goal: Ability to identify appropriate support needs will improve Outcome: Progressing   Problem: Health Behavior/Discharge Planning: Goal: Ability to manage health-related needs will improve Outcome: Progressing   Problem: Nutritional: Goal: Risk of aspiration will decrease Outcome: Progressing Goal: Dietary intake will improve Outcome: Progressing   Problem: Communication: Goal: Ability to communicate needs accurately will improve Outcome: Progressing

## 2020-08-10 NOTE — Progress Notes (Signed)
Patient ID: Patrick Phillips, male   DOB: January 21, 1941, 80 y.o.   MRN: [id number] Dr. Craig Guess note reviewed today.  Agree with conservative approach for this process.  Patient does have ligamentous injury to the cervical spine.  Collar immobilization is reasonable for the time being but if further work-up was desired he would require an MRI and this would likely require heavy sedation or intubation.  In light of changing plans to comfort measures I believe that a soft collar can be used or collar can be dismissed altogether.

## 2020-08-11 DIAGNOSIS — S14109A Unspecified injury at unspecified level of cervical spinal cord, initial encounter: Secondary | ICD-10-CM | POA: Diagnosis not present

## 2020-08-11 DIAGNOSIS — F0151 Vascular dementia with behavioral disturbance: Secondary | ICD-10-CM | POA: Diagnosis not present

## 2020-08-11 DIAGNOSIS — Z515 Encounter for palliative care: Secondary | ICD-10-CM

## 2020-08-11 DIAGNOSIS — Z7189 Other specified counseling: Secondary | ICD-10-CM

## 2020-08-11 DIAGNOSIS — N390 Urinary tract infection, site not specified: Secondary | ICD-10-CM | POA: Diagnosis not present

## 2020-08-11 DIAGNOSIS — Z66 Do not resuscitate: Secondary | ICD-10-CM

## 2020-08-11 DIAGNOSIS — G9341 Metabolic encephalopathy: Secondary | ICD-10-CM | POA: Diagnosis not present

## 2020-08-11 DIAGNOSIS — S065X9A Traumatic subdural hemorrhage with loss of consciousness of unspecified duration, initial encounter: Secondary | ICD-10-CM | POA: Diagnosis not present

## 2020-08-11 LAB — URINE CULTURE: Culture: 40000 — AB

## 2020-08-11 LAB — GLUCOSE, CAPILLARY
Glucose-Capillary: 96 mg/dL (ref 70–99)
Glucose-Capillary: 99 mg/dL (ref 70–99)

## 2020-08-11 MED ORDER — LORAZEPAM 2 MG/ML IJ SOLN
1.0000 mg | INTRAMUSCULAR | Status: DC | PRN
  Administered 2020-08-11 – 2020-08-12 (×4): 1 mg via INTRAVENOUS
  Filled 2020-08-11 (×4): qty 1

## 2020-08-11 NOTE — Plan of Care (Signed)
  Problem: Safety: Goal: Non-violent Restraint(s) Outcome: Progressing   Problem: Education: Goal: Knowledge of General Education information will improve Description: Including pain rating scale, medication(s)/side effects and non-pharmacologic comfort measures Outcome: Progressing

## 2020-08-11 NOTE — Progress Notes (Addendum)
Subjective: CC: Patient transitioned to comfort care yesterday. No acute events noted overnight. He is resting comfortably this morning.   Objective: Vital signs in last 24 hours: Temp:  [98.5 F (36.9 C)] 98.5 F (36.9 C) (06/15 0516) Pulse Rate:  [61-123] 68 (06/15 0516) Resp:  [8-26] 19 (06/15 0516) BP: (116-171)/(56-95) 167/56 (06/15 0516) SpO2:  [88 %-99 %] 95 % (06/15 0516) Last BM Date:  (PTA)  Intake/Output from previous day: 06/14 0701 - 06/15 0700 In: 1166.3 [I.V.:866.1; IV Piggyback:300.3] Out: 1225 [Urine:1225] Intake/Output this shift: No intake/output data recorded.  PE: Gen: comfortable, no distress Neck: c-collar Neuro: not following commands HEENT: PERRL CV: RRR Pulm: CTA b/l, normal rate and effort unlabored breathing Abd: soft, NT, ND, +BS GU: clear yellow urine, foley Extr: wwp, no edema  Lab Results:  Recent Labs    08/09/20 1105 08/10/20 0153  WBC 11.7* 11.1*  HGB 10.9* 11.7*  HCT 33.4* 35.7*  PLT 302 297   BMET Recent Labs    08/09/20 1105 08/10/20 0153  NA 134* 136  K 4.0 3.5  CL 99 102  CO2 26 26  GLUCOSE 169* 97  BUN 25* 15  CREATININE 0.96 0.89  CALCIUM 9.0 8.9   PT/INR Recent Labs    08/09/20 1645  LABPROT 13.7  INR 1.0   CMP     Component Value Date/Time   NA 136 08/10/2020 0153   K 3.5 08/10/2020 0153   CL 102 08/10/2020 0153   CO2 26 08/10/2020 0153   GLUCOSE 97 08/10/2020 0153   BUN 15 08/10/2020 0153   CREATININE 0.89 08/10/2020 0153   CALCIUM 8.9 08/10/2020 0153   PROT 5.5 (L) 08/09/2020 1105   ALBUMIN 2.9 (L) 08/09/2020 1105   AST 28 08/09/2020 1105   ALT 28 08/09/2020 1105   ALKPHOS 43 08/09/2020 1105   BILITOT 0.8 08/09/2020 1105   GFRNONAA >60 08/10/2020 0153   GFRAA >60 05/20/2018 1419   Lipase     Component Value Date/Time   LIPASE 39 06/10/2020 1055       Studies/Results: CT Head Wo Contrast  Result Date: 08/09/2020 CLINICAL DATA:  Head trauma, minor. Poly trauma,  critical, head/cervical spine injury suspected. Additional history provided: Dizziness, fall, contusion to right forehead. EXAM: CT HEAD WITHOUT CONTRAST CT CERVICAL SPINE WITHOUT CONTRAST TECHNIQUE: Multidetector CT imaging of the head and cervical spine was performed following the standard protocol without intravenous contrast. Multiplanar CT image reconstructions of the cervical spine were also generated. COMPARISON:  CT head/maxillofacial and cervical spine 07/31/2020. FINDINGS: CT HEAD FINDINGS Brain: Mild generalized parenchymal atrophy. Thin acute subdural hematoma overlying the lateral aspect of the right frontal lobe measuring up to 2-3 mm in thickness (for instance as seen on series 5, image 32). No demarcated cortical infarct. No evidence of intracranial mass. No midline shift. Vascular: No hyperdense vessel.  Atherosclerotic calcifications. Skull: Redemonstrated subacute comminuted fracture involving the outer table of the left frontal sinus with extension into the left orbital roof. Sinuses/Orbits: There is gas within the extraconal right orbit and within the right periorbital soft tissues. The globes are normal in size and contour. No definite retro bulbar hematoma is identified. Moderate mucosal thickening within the left frontal sinus. Mild mucosal thickening within the right frontal sinus. Mucosal thickening and fluid within the bilateral ethmoid air cells. Mild mucosal thickening and small mucous retention cysts within the bilateral maxillary sinuses. Other: Acute medially displaced fracture of the right lamina papyracea. A minimally  depressed acute fracture of the right orbital floor may also be present. Soft tissue gas is present along the posterolateral wall the right maxillary sinus and within the right facial soft tissues. Right anterior scalp/forehead hematoma. CT CERVICAL SPINE FINDINGS Alignment: 2 mm C3-C4 grade 1 retrolisthesis, slightly increased from the prior cervical spine CT of  07/31/2020. Additionally, there is subtle widening at the anterior aspect of the C3-C4 and C4-C5 disc spaces as compared to the prior cervical spine CT. Partially imaged upper thoracic dextrocurvature. Skull base and vertebrae: The basion-dental and atlanto-dental intervals are maintained.Acute, mildly displaced fracture of the C4 spinous process (series 10, image 49). Soft tissues and spinal canal: No prevertebral fluid or swelling. No visible canal hematoma. Disc levels: Cervical spondylosis with multilevel disc space narrowing, central disc protrusions, disc bulges, uncovertebral hypertrophy and facet arthrosis. Multilevel spinal canal stenosis. Most notably, a central disc protrusion contributes to apparent moderate spinal canal stenosis at C3-C4. Multilevel bony neural foraminal narrowing. There is a small amount of gas within the C3 disc space. Upper chest: No consolidation within the imaged lung apices. No visible pneumothorax. These results were called by telephone at the time of interpretation on 08/09/2020 at 11:14 am to provider Fredia Sorrow , who verbally acknowledged these results. IMPRESSION: CT head: 1. Thin acute subdural hematoma overlying the lateral right frontal lobe. 2. Right anterior scalp/forehead hematoma. 3. Acute medially displaced fracture of the right lamina papyracea. A minimally depressed acute fracture of the right orbital floor may also be present. A dedicated maxillofacial CT is recommended for further evaluation. 4. Gas within the extraconal right orbit and within the right periorbital soft tissues. 5. Additional gas along the posterolateral wall the right maxillary sinus and within the right face soft tissues. 6. Redemonstrated subacute comminuted and depressed fracture through the outer table of the left frontal calvarium with extension to the left orbital roof. 7. Paranasal sinus disease, as described. CT cervical spine: 1. Acute, mildly displaced fracture of the C4 spinous  process. 2. There is 2 mm C3-C4 grade 1 retrolisthesis, progressed from the recent prior cervical spine CT of 07/31/2020. A small amount of gas is now present within the C3-C4 disc. Additionally, there is subtle widening at the anterior aspect of the C3-C4 and C4-C5 disc spaces as compared to the prior CT. An MRI of the cervical spine is recommended to evaluate for possible ligamentous injury. 3. Cervical spondylosis, as described. Electronically Signed   By: Kellie Simmering DO   On: 08/09/2020 11:21   CT Cervical Spine Wo Contrast  Result Date: 08/09/2020 CLINICAL DATA:  Head trauma, minor. Poly trauma, critical, head/cervical spine injury suspected. Additional history provided: Dizziness, fall, contusion to right forehead. EXAM: CT HEAD WITHOUT CONTRAST CT CERVICAL SPINE WITHOUT CONTRAST TECHNIQUE: Multidetector CT imaging of the head and cervical spine was performed following the standard protocol without intravenous contrast. Multiplanar CT image reconstructions of the cervical spine were also generated. COMPARISON:  CT head/maxillofacial and cervical spine 07/31/2020. FINDINGS: CT HEAD FINDINGS Brain: Mild generalized parenchymal atrophy. Thin acute subdural hematoma overlying the lateral aspect of the right frontal lobe measuring up to 2-3 mm in thickness (for instance as seen on series 5, image 32). No demarcated cortical infarct. No evidence of intracranial mass. No midline shift. Vascular: No hyperdense vessel.  Atherosclerotic calcifications. Skull: Redemonstrated subacute comminuted fracture involving the outer table of the left frontal sinus with extension into the left orbital roof. Sinuses/Orbits: There is gas within the extraconal right orbit and within  the right periorbital soft tissues. The globes are normal in size and contour. No definite retro bulbar hematoma is identified. Moderate mucosal thickening within the left frontal sinus. Mild mucosal thickening within the right frontal sinus. Mucosal  thickening and fluid within the bilateral ethmoid air cells. Mild mucosal thickening and small mucous retention cysts within the bilateral maxillary sinuses. Other: Acute medially displaced fracture of the right lamina papyracea. A minimally depressed acute fracture of the right orbital floor may also be present. Soft tissue gas is present along the posterolateral wall the right maxillary sinus and within the right facial soft tissues. Right anterior scalp/forehead hematoma. CT CERVICAL SPINE FINDINGS Alignment: 2 mm C3-C4 grade 1 retrolisthesis, slightly increased from the prior cervical spine CT of 07/31/2020. Additionally, there is subtle widening at the anterior aspect of the C3-C4 and C4-C5 disc spaces as compared to the prior cervical spine CT. Partially imaged upper thoracic dextrocurvature. Skull base and vertebrae: The basion-dental and atlanto-dental intervals are maintained.Acute, mildly displaced fracture of the C4 spinous process (series 10, image 49). Soft tissues and spinal canal: No prevertebral fluid or swelling. No visible canal hematoma. Disc levels: Cervical spondylosis with multilevel disc space narrowing, central disc protrusions, disc bulges, uncovertebral hypertrophy and facet arthrosis. Multilevel spinal canal stenosis. Most notably, a central disc protrusion contributes to apparent moderate spinal canal stenosis at C3-C4. Multilevel bony neural foraminal narrowing. There is a small amount of gas within the C3 disc space. Upper chest: No consolidation within the imaged lung apices. No visible pneumothorax. These results were called by telephone at the time of interpretation on 08/09/2020 at 11:14 am to provider Fredia Sorrow , who verbally acknowledged these results. IMPRESSION: CT head: 1. Thin acute subdural hematoma overlying the lateral right frontal lobe. 2. Right anterior scalp/forehead hematoma. 3. Acute medially displaced fracture of the right lamina papyracea. A minimally depressed  acute fracture of the right orbital floor may also be present. A dedicated maxillofacial CT is recommended for further evaluation. 4. Gas within the extraconal right orbit and within the right periorbital soft tissues. 5. Additional gas along the posterolateral wall the right maxillary sinus and within the right face soft tissues. 6. Redemonstrated subacute comminuted and depressed fracture through the outer table of the left frontal calvarium with extension to the left orbital roof. 7. Paranasal sinus disease, as described. CT cervical spine: 1. Acute, mildly displaced fracture of the C4 spinous process. 2. There is 2 mm C3-C4 grade 1 retrolisthesis, progressed from the recent prior cervical spine CT of 07/31/2020. A small amount of gas is now present within the C3-C4 disc. Additionally, there is subtle widening at the anterior aspect of the C3-C4 and C4-C5 disc spaces as compared to the prior CT. An MRI of the cervical spine is recommended to evaluate for possible ligamentous injury. 3. Cervical spondylosis, as described. Electronically Signed   By: Kellie Simmering DO   On: 08/09/2020 11:21   MR Cervical Spine Wo Contrast  Result Date: 08/09/2020 CLINICAL DATA:  Fall. Abnormal cervical spine CT. C4 spinous process fracture. EXAM: MRI CERVICAL SPINE WITHOUT CONTRAST TECHNIQUE: Multiplanar, multisequence MR imaging of the cervical spine was performed. No intravenous contrast was administered. COMPARISON:  CT 08/09/2020 FINDINGS: Technical note: Despite efforts by the technologist and patient, significant motion artifact is present on today's exam and could not be eliminated. This reduces exam sensitivity and specificity. Alignment: Trace retrolisthesis of C3 on C4. Mild asymmetric widening of the C3-4 disc space anteriorly with high T1/T2 signal within  the central and posterior aspects of the disc, suggestive of blood product. Exaggerated cervical lordosis. Vertebrae: Known C4 spinous process fracture is not well  seen on motion degraded MR images. No additional fracture is identified on motion degraded images. No vertebral body bone marrow edema. Vertebral body heights are maintained. Cord: Motion degraded. Increased cord signal at the C3-4 and C4-5 levels cannot be excluded. Posterior Fossa, vertebral arteries, paraspinal tissues: There is disruption of the anterior longitudinal ligament between the C3-4 and C4-5 levels (series 5, image 8) with a small amount of prevertebral fluid extending from the C1-2 to C4-5 levels measuring approximately 5 mm in AP thickness. Interspinous ligament edema at C3-4 and C4-5. Disc levels: C2-C3: Motion degraded. No evidence of disc protrusion. No appreciable foraminal or canal stenosis. C3-C4: Motion degraded. Slight retrolisthesis and posterior disc osteophyte complex result and at least moderate canal stenosis. Right greater than left foraminal stenosis. C4-C5: Motion degraded. Disc osteophyte complex and uncovertebral spurring result in at least moderate canal stenosis and at least mild right foraminal stenosis. C5-C6: Motion degraded. Suspect small disc osteophyte complex. There may be mild foraminal and canal stenosis at this level. C6-C7: Motion degraded. No evidence of high-grade foraminal or canal stenosis. C7-T1: Motion degraded. No evidence of high-grade canal or foraminal stenosis. IMPRESSION: 1. Significantly motion degraded study. 2. Findings of cervical hyperextension injury with disruption of the C3-4 disc space including a small amount of hemorrhage within the C3-4 disc. There is disruption of the anterior longitudinal ligament between the C3-4 and C4-5 levels with small amount of prevertebral fluid/hemorrhage. Interspinous ligament injury at C3-4 and C4-5. 3. Cord edema/contusion cannot be excluded on motion degraded images. 4. C4 spinous process fracture, better seen on recent CT. No additional fractures are identified on this study. 5. Degenerative changes of the cervical  spine appear most pronounced at the C3-4 and C4-5 levels where there is at least moderate canal stenosis. These results will be called to the ordering clinician or representative by the Radiologist Assistant, and communication documented in the PACS or [employer]. Electronically Signed   By: Davina Poke D.O.   On: 08/09/2020 20:27   CT CHEST ABDOMEN PELVIS W CONTRAST  Result Date: 08/09/2020 CLINICAL DATA:  Fall, abdomen and back pain. EXAM: CT CHEST, ABDOMEN, AND PELVIS WITH CONTRAST TECHNIQUE: Multidetector CT imaging of the chest, abdomen and pelvis was performed following the standard protocol during bolus administration of intravenous contrast. CONTRAST:  143mL OMNIPAQUE IOHEXOL 300 MG/ML  SOLN COMPARISON:  07/21/2020 FINDINGS: CT CHEST FINDINGS Cardiovascular: Coronary artery and aortic calcifications. Heart is normal size. Aorta is normal caliber. Mediastinum/Nodes: No mediastinal, hilar, or axillary adenopathy. Trachea and esophagus are unremarkable. Thyroid unremarkable. Lungs/Pleura: Dependent atelectasis. No confluent opacities or pneumothorax. Musculoskeletal: Chest wall soft tissues are unremarkable. No acute bony abnormality. CT ABDOMEN PELVIS FINDINGS Hepatobiliary: No hepatic injury or perihepatic hematoma. Gallbladder is unremarkable Pancreas: No focal abnormality or ductal dilatation. Spleen: No splenic injury or perisplenic hematoma. Adrenals/Urinary Tract: No adrenal hemorrhage or renal injury identified. Bladder is unremarkable. Bilateral renal cysts, the largest in the midpole of the right kidney measuring 4.1 cm. No hydronephrosis. Stomach/Bowel: Stomach, large and small bowel grossly unremarkable. Vascular/Lymphatic: Aortic atherosclerosis. No evidence of aneurysm or adenopathy. Reproductive: Prostate enlargement. Other: No free fluid or free air. Musculoskeletal: No acute bony abnormality. IMPRESSION: No evidence of significant traumatic injury in the chest, abdomen or pelvis.  Coronary artery disease, aortic atherosclerosis. No acute findings in the chest, abdomen or pelvis. Electronically Signed  By: Rolm Baptise M.D.   On: 08/09/2020 23:47   CT Maxillofacial WO CM  Result Date: 08/09/2020 CLINICAL DATA:  Facial trauma. Dizziness in fall with contusion to the right forehead. EXAM: CT MAXILLOFACIAL WITHOUT CONTRAST TECHNIQUE: Multidetector CT imaging of the maxillofacial structures was performed. Multiplanar CT image reconstructions were also generated. COMPARISON:  07/31/2020 FINDINGS: Osseous: Mildly depressed fractures of the left anterior frontal bone involving the outer table of the left frontal sinus. Fracture lines extend to the superior left orbital rim. Mildly depressed right medial, lateral, and inferior orbital rim fractures. Cortical irregularity in the anterior and lateral right maxillary antral wall likely represents nondisplaced fractures. The left maxillary antral walls, nasal bones, zygomatic arches, pterygoid plates, mandibles, and temporomandibular joints appear intact. Multiple prior dental extractions. Orbits: Moderate right periorbital soft tissue hematoma with soft tissue gas inferior to the right orbit and extraconal gas in the right orbit. Globes and extraocular muscles appear intact and symmetrical. No intraconal infiltration or gas. Sinuses: Mucosal thickening in the paranasal sinuses. No acute air-fluid levels. Mastoid air cells are clear. Soft tissues: Periorbital and right facial soft tissue swelling and gas as discussed above. Gas extends along the right face inferiorly to the maxillary and upper mandibular region. Small amount of gas in the right masseter space. Limited intracranial: Atrophic changes are suggested. IMPRESSION: 1. Depressed left frontal and superior orbital rim fractures are unchanged since previous study of 07/31/2020. Left periorbital soft tissue swelling is decreased. 2. New fractures of the right orbit and maxillary sinus walls with  new right periorbital hematoma, right periorbital and infraorbital soft tissue gas, and extraconal right orbital gas. Electronically Signed   By: Lucienne Capers M.D.   On: 08/09/2020 21:31    Anti-infectives: Anti-infectives (From admission, onward)    Start     Dose/Rate Route Frequency Ordered Stop   08/10/20 1600  cefTRIAXone (ROCEPHIN) 1 g in sodium chloride 0.9 % 100 mL IVPB  Status:  Discontinued        1 g 200 mL/hr over 30 Minutes Intravenous Every 24 hours 08/09/20 2140 08/09/20 2229   08/10/20 0000  ceFEPIme (MAXIPIME) 2 g in sodium chloride 0.9 % 100 mL IVPB        2 g 200 mL/hr over 30 Minutes Intravenous Every 8 hours 08/09/20 2249     08/09/20 1615  cefTRIAXone (ROCEPHIN) 1 g in sodium chloride 0.9 % 100 mL IVPB        1 g 200 mL/hr over 30 Minutes Intravenous  Once 08/09/20 1610 08/09/20 1725        Assessment/Plan FFS SDH - NSGY c/s, Dr. Ellene Route, keppra x7d for sz ppx, no f/u CT recommended C4 SP fx - NSGY c/s, Dr. Ellene Route, per note on 6/14 in light of transitioning to comfort care - okay for soft collar or d/c collar  Facial fractures - ENT c/s, Dr. Conley Simmonds, recs for sinus precautions x2w Agitation - good response to geodon x2, on prn Hx DM2 - CBG's 90-120's  UTI - empiric cefepime. Continue foley for urinary retention.  HTN - prn antihypertensives  DNR FEN - CLD and okay to have ice cream DVT - SCDs Dispo - Comfort care. Palliative consult for further discussions including the option of transitioning to hospice.    LOS: 2 days    Jillyn Ledger , [facility] Surgery 08/11/2020, 8:33 AM Please see Amion for pager number during day hours 7:00am-4:30pm

## 2020-08-11 NOTE — TOC Initial Note (Signed)
Transition of Care [facility]) - Initial/Assessment Note    Patient Details  Name: Patrick Phillips MRN: [id number] Date of Birth: [birth year]  Transition of Care [facility]) CM/SW Contact:    Ella Bodo, RN Phone Number: 08/11/2020, 1:33 PM  Clinical Narrative: Patient admitted on 08/09/2020 after falling at his skilled nursing facility.  Patient from [facility] skilled nursing facility in [place].  Patient has history of vascular dementia; his work-up in the ED showed new right orbital fractures as well as an acute right frontal SDH and C-spine fractures with possible ligamentous injury.               Family has chosen comfort measures for patient; palliative medicine team met with patient's family today.  Family desires patient be transferred to residential hospice in [place].  Referral to Cassandra at [facility]; she states there are currently no beds available today.  TOC/trauma case manager to follow up with hospice facility daily for bed updates.  Spoke with patient's daughter [name] and updated her on current status of bed at hospice facility.  Will follow with updates as they are available.  Expected Discharge Plan: [place] Barriers to Discharge: Continued Medical Work up   Patient Goals and CMS Choice   CMS Medicare.gov Compare Post Acute Care list provided to:: Patient Represenative (must comment) Choice offered to / list presented to : Adult Children  Expected Discharge Plan and Services Expected Discharge Plan: [place]   Discharge Planning Services: CM Consult Post Acute Care Choice: Hospice Living arrangements for the past 2 months: [place]                                      Prior Living Arrangements/Services Living arrangements for the past 2 months: [place] Lives with:: Facility Resident Patient language and need for interpreter reviewed:: Yes Do you feel safe going  back to the place where you live?: Yes      Need for Family Participation in Patient Care: Yes (Comment) Care giver support system in place?: Yes (comment)   Criminal Activity/Legal Involvement Pertinent to Current Situation/Hospitalization: No - Comment as needed  Activities of Daily Living Home Assistive Devices/Equipment: Wheelchair ADL Screening (condition at time of admission) Patient's cognitive ability adequate to safely complete daily activities?: No Is the patient deaf or have difficulty hearing?: No Does the patient have difficulty seeing, even when wearing glasses/contacts?: No Does the patient have difficulty concentrating, remembering, or making decisions?: Yes Patient able to express need for assistance with ADLs?: No Does the patient have difficulty dressing or bathing?: Yes Independently performs ADLs?: No Communication: Needs assistance Is this a change from baseline?: Pre-admission baseline Dressing (OT): Needs assistance Is this a change from baseline?: Pre-admission baseline Grooming: Needs assistance Is this a change from baseline?: Pre-admission baseline Feeding: Needs assistance Is this a change from baseline?: Pre-admission baseline Bathing: Needs assistance Is this a change from baseline?: Pre-admission baseline Toileting: Dependent Is this a change from baseline?: Pre-admission baseline In/Out Bed: Needs assistance Is this a change from baseline?: Pre-admission baseline Walks in Home: Needs assistance Is this a change from baseline?: Pre-admission baseline Does the patient have difficulty walking or climbing stairs?: Yes Weakness of Legs: None Weakness of Arms/Hands: None  Permission Sought/Granted         Permission granted to share info w AGENCY: [facility]  Emotional Assessment Appearance:: Appears stated age Attitude/Demeanor/Rapport: Lethargic Affect (typically observed): Appropriate        Admission diagnosis:   Subdural hematoma ([place]) [K18.2E8F] Fall [W19.XXXA] Acute cystitis without hematuria [N30.00] Fall, initial encounter [W19.XXXA] Multiple facial fractures, closed, initial encounter [facility]) [S02.92XA] Patient Active Problem List   Diagnosis Date Noted   Pressure injury of skin 08/10/2020   SDH (subdural hematoma) ([place]) 08/09/2020   Cervical spinal cord injury, initial encounter ([place]) 08/09/2020   Right orbital fracture, closed, initial encounter ([place]) 08/09/2020   Fracture of orbit, left, closed, with routine healing, subsequent encounter 08/09/2020   Urinary tract infection associated with catheterization of urinary tract, initial encounter ([place]) 08/09/2020   Subdural hematoma ([place]) 08/09/2020   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[place]) 07/24/2020   Acute colitis 07/21/2020   Volume depletion 07/21/2020   Normocytic anemia 07/21/2020   Malnutrition of moderate degree 37/44/5146   Acute metabolic encephalopathy 04/79/9872   COVID-19 virus infection 06/11/2020   Vascular dementia ([place]) 06/11/2020   Acute ischemic stroke ([place]) 07/24/2017   Chest pain 12/22/2015   Chronic back pain 12/22/2015   Syncope 10/17/2013   Sinus bradycardia 10/17/2013   SOB (shortness of breath) 10/17/2013   Hypertension    Type 2 diabetes mellitus ([place])    PCP:  [name], MD Pharmacy:   [facility], [place] - [address] [place] [address] Phone: [phone] Fax: [phone]     Social Determinants of Health (SDOH) Interventions    Readmission Risk Interventions No flowsheet data found.  Reinaldo Raddle, RN, BSN  Trauma/Neuro ICU Case Manager [phone]

## 2020-08-11 NOTE — Progress Notes (Signed)
PROGRESS NOTE  Patrick Phillips ZOX:[id number] DOB: 29-Nov-1940 DOA: 08/09/2020 PCP: [name], MD  HPI/Recap of past 24 hours: HPI from Dr [name] is an 80 y.o. male with h/o Vascular dementia, HTN, DM2. Pt has h/o severe multiple episodes of delirium in past, with multiple admissions. Pt admitted in April for Delirium determined to be secondary to COVID-19 admitted again in May for delirium secondary to colitis.  During that admit had urinary retention, foley catheter placed. Fall and facial trauma (L orbital fx) on 6/4. Delirium due to UTI on 6/6, given IM rocephin in ED. Pt now presents to ED after fall at SNF.  Apparently got up, fell, hit head.  Pt with delirium and agitation.  Pt unable to provide any additional history. Daughter at bedside: pt gets very agitated like this whenever he has an infection of any sort. In the ED, work up reveals possible UTI, small traumatic SDH, new R orbital Fx, old L orbital fx unchanged, cervical spine bony and ligamentous injury, cant rule out cord edema. Pt required multiple rounds of Geodon for sedation in the ED. Hospitalist consulted to assist in further management.    Early today, pt sleeping, appears lightly sedated, appeared comfortable. Patient transitioned to comfort care, awaiting residential hospice placement.   Assessment/Plan: Principal Problem:   Cervical spinal cord injury, initial encounter [facility]) Active Problems:   Hypertension   Type 2 diabetes mellitus (HCC)   Acute metabolic encephalopathy   Vascular dementia ([place])   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HCC)   SDH (subdural hematoma) (HCC)   Right orbital fracture, closed, initial encounter ([place])   Fracture of orbit, left, closed, with routine healing, subsequent encounter   Urinary tract infection associated with catheterization of urinary tract, initial encounter ([place])   Subdural hematoma (HCC)   Pressure injury of skin   Delirium with history of vascular  dementia Multifactorial, likely exacerbated due to possible bladder distention with malpositioned Foley (hx of urinary retention) Vs possible UTI Vs multiple fracture Foley catheter replaced Delirium precautions Continue Geodon, Ativan prn IVF Comfort care  UTI UA with large leukocytes, >50 UC growing yeast S/P cefepime, currently comfort care  Diabetes mellitus type 2 SSI  Hypertension IV Hydralazine prn  Cervical spinal cord injury Continue cervical hard collar Further management per NS  Right orbital fracture History of left orbital fracture-no new changes on CT For management per trauma ENT  Small traumatic SDH Further management per NS  Goals of care discussion Conversations between daughter and attending MD were held, family agreeable to comfort care measures Patient is currently DNR Hospice/palliative care team consulted- residential hospice placement pending    Discussed with Trauma MD, Dr Reather Laurence, [facility] will be signing off     Estimated body mass index is 21.38 kg/m as calculated from the following:   Height as of this encounter: 6' (1.829 m).   Weight as of this encounter: 71.5 kg.     Code Status: DNR  Family Communication: None at bedside  Disposition Plan: Status is: Inpatient  Remains inpatient appropriate because:Inpatient level of care appropriate due to severity of illness  Dispo: The patient is from: SNF              Anticipated d/c is to: SNF              Patient currently is not medically stable to d/c.   Difficult to place patient No   Consultants: [facility] hospitalist Neurosurgery Trauma ENT  Procedures: None  Antimicrobials: None  DVT prophylaxis: SCD, due to SDH   Objective: Vitals:   08/10/20 1900 08/10/20 2000 08/10/20 2100 08/11/20 0516  BP: 132/82 (!) 171/95 (!) 156/60 (!) 167/56  Pulse: 71 (!) 101 80 68  Resp: 15 (!) 24 20 19   Temp:    98.5 F (36.9 C)  TempSrc:    Oral  SpO2: 97% 94% 94% 95%  Weight:       Height:        Intake/Output Summary (Last 24 hours) at 08/11/2020 1541 Last data filed at 08/11/2020 1300 Gross per 24 hour  Intake 650.55 ml  Output 1080 ml  Net -429.45 ml   Filed Weights   08/10/20 0014  Weight: 71.5 kg    Exam: General: NAD, sedated, bruising noted on face Cardiovascular: S1, S2 present Respiratory: CTAB Abdomen: Soft, nontender, nondistended, bowel sounds present Musculoskeletal: No bilateral pedal edema noted Skin: Normal Psychiatry: Unable to assess   Data Reviewed: CBC: Recent Labs  Lab 08/09/20 1105 08/10/20 0153  WBC 11.7* 11.1*  NEUTROABS 9.5*  --   HGB 10.9* 11.7*  HCT 33.4* 35.7*  MCV 88.8 87.5  PLT 302 161   Basic Metabolic Panel: Recent Labs  Lab 08/09/20 1105 08/10/20 0153  NA 134* 136  K 4.0 3.5  CL 99 102  CO2 26 26  GLUCOSE 169* 97  BUN 25* 15  CREATININE 0.96 0.89  CALCIUM 9.0 8.9   GFR: Estimated Creatinine Clearance: 68.1 mL/min (by C-G formula based on SCr of 0.89 mg/dL). Liver Function Tests: Recent Labs  Lab 08/09/20 1105  AST 28  ALT 28  ALKPHOS 43  BILITOT 0.8  PROT 5.5*  ALBUMIN 2.9*   No results for input(s): LIPASE, AMYLASE in the last 168 hours. Recent Labs  Lab 08/09/20 1645  AMMONIA 24   Coagulation Profile: Recent Labs  Lab 08/09/20 1645  INR 1.0   Cardiac Enzymes: No results for input(s): CKTOTAL, CKMB, CKMBINDEX, TROPONINI in the last 168 hours. BNP (last 3 results) No results for input(s): PROBNP in the last 8760 hours. HbA1C: No results for input(s): HGBA1C in the last 72 hours. CBG: Recent Labs  Lab 08/10/20 0759 08/10/20 1238 08/10/20 1606 08/11/20 0016 08/11/20 0430  GLUCAP 112* 127* 107* 96 99   Lipid Profile: No results for input(s): CHOL, HDL, LDLCALC, TRIG, CHOLHDL, LDLDIRECT in the last 72 hours. Thyroid Function Tests: Recent Labs    08/10/20 0153  TSH 2.019   Anemia Panel: No results for input(s): VITAMINB12, FOLATE, FERRITIN, TIBC, IRON,  RETICCTPCT in the last 72 hours. Urine analysis:    Component Value Date/Time   COLORURINE YELLOW 08/09/2020 1500   APPEARANCEUR TURBID (A) 08/09/2020 1500   LABSPEC 1.018 08/09/2020 1500   PHURINE 5.0 08/09/2020 1500   GLUCOSEU NEGATIVE 08/09/2020 1500   HGBUR MODERATE (A) 08/09/2020 1500   BILIRUBINUR NEGATIVE 08/09/2020 1500   KETONESUR 5 (A) 08/09/2020 1500   PROTEINUR 30 (A) 08/09/2020 1500   NITRITE NEGATIVE 08/09/2020 1500   LEUKOCYTESUR LARGE (A) 08/09/2020 1500   Sepsis Labs: @LABRCNTIP (procalcitonin:4,lacticidven:4)  ) Recent Results (from the past 240 hour(s))  Resp Panel by RT-PCR (Flu A&B, Covid) Nasopharyngeal Swab     Status: None   Collection Time: 08/02/20  4:23 PM   Specimen: Nasopharyngeal Swab; Nasopharyngeal(NP) swabs in vial transport medium  Result Value Ref Range Status   SARS Coronavirus 2 by RT PCR NEGATIVE NEGATIVE Final    Comment: (NOTE) SARS-CoV-2 target nucleic acids are NOT DETECTED.  The SARS-CoV-2  RNA is generally detectable in upper respiratory specimens during the acute phase of infection. The lowest concentration of SARS-CoV-2 viral copies this assay can detect is 138 copies/mL. A negative result does not preclude SARS-Cov-2 infection and should not be used as the sole basis for treatment or other patient management decisions. A negative result may occur with  improper specimen collection/handling, submission of specimen other than nasopharyngeal swab, presence of viral mutation(s) within the areas targeted by this assay, and inadequate number of viral copies(<138 copies/mL). A negative result must be combined with clinical observations, patient history, and epidemiological information. The expected result is Negative.  Fact Sheet for Patients:  EntrepreneurPulse.com.au  Fact Sheet for Healthcare Providers:  IncredibleEmployment.be  This test is no t yet approved or cleared by the [place] FDA  and  has been authorized for detection and/or diagnosis of SARS-CoV-2 by FDA under an Emergency Use Authorization (EUA). This EUA will remain  in effect (meaning this test can be used) for the duration of the COVID-19 declaration under Section 564(b)(1) of the Act, 21 U.S.C.section 360bbb-3(b)(1), unless the authorization is terminated  or revoked sooner.       Influenza A by PCR NEGATIVE NEGATIVE Final   Influenza B by PCR NEGATIVE NEGATIVE Final    Comment: (NOTE) The Xpert Xpress SARS-CoV-2/FLU/RSV plus assay is intended as an aid in the diagnosis of influenza from Nasopharyngeal swab specimens and should not be used as a sole basis for treatment. Nasal washings and aspirates are unacceptable for Xpert Xpress SARS-CoV-2/FLU/RSV testing.  Fact Sheet for Patients: EntrepreneurPulse.com.au  Fact Sheet for Healthcare Providers: IncredibleEmployment.be  This test is not yet approved or cleared by the [place] FDA and has been authorized for detection and/or diagnosis of SARS-CoV-2 by FDA under an Emergency Use Authorization (EUA). This EUA will remain in effect (meaning this test can be used) for the duration of the COVID-19 declaration under Section 564(b)(1) of the Act, 21 U.S.C. section 360bbb-3(b)(1), unless the authorization is terminated or revoked.  Performed at [facility], [place] [address] [place], [place] [address]   Urine culture     Status: Abnormal   Collection Time: 08/09/20  3:55 PM   Specimen: Urine, Random  Result Value Ref Range Status   Specimen Description URINE, RANDOM  Final   Special Requests   Final    NONE Performed at [facility], [place] [address] [place], [place] [address]    Culture 10,000 COLONIES/mL YEAST (A)  Final   Report Status 08/10/2020 FINAL  Final  MRSA Next Gen by PCR, Nasal     Status: None   Collection Time: 08/10/20 12:13 AM  Result Value Ref Range Status   MRSA by PCR Next  Gen NOT DETECTED NOT DETECTED Final    Comment: (NOTE) The GeneXpert MRSA Assay (FDA approved for NASAL specimens only), is one component of a comprehensive MRSA colonization surveillance program. It is not intended to diagnose MRSA infection nor to guide or monitor treatment for MRSA infections. Test performance is not FDA approved in patients less than 17 years old. Performed at [facility], [place] [address] [place], [place] [address]   Culture, Urine     Status: Abnormal   Collection Time: 08/10/20  7:38 AM   Specimen: Urine, Random  Result Value Ref Range Status   Specimen Description URINE, RANDOM  Final   Special Requests NONE  Final   Culture 40,000 COLONIES/mL YEAST (A)  Final   Report  Status 08/11/2020 FINAL  Final      Studies: No results found.  Scheduled Meds:  insulin aspart  0-9 Units Subcutaneous Q4H    Continuous Infusions:  sodium chloride 75 mL/hr at 08/10/20 2000   levETIRAcetam 500 mg (08/10/20 2158)     LOS: 2 days     Alma Friendly, MD [facility]  If 7PM-7AM, please contact night-coverage [email or web address] 08/11/2020, 3:41 PM

## 2020-08-11 NOTE — TOC CAGE-AID Note (Signed)
Transition of Care [facility]) - CAGE-AID Screening   Patient Details  Name: Patrick Phillips MRN: [id number] Date of Birth: Apr 16, 1940  Elvina Sidle, RN Trauma Response Nurse Phone Number: [phone] 08/11/2020, 9:25 AM   Clinical Narrative:   Pt has a hx of delirium with history of vascular dementia   CAGE-AID Screening: Substance Abuse Screening unable to be completed due to: : Patient unable to participate (unable to answer questions, has been referred to palliative care/hospice)

## 2020-08-11 NOTE — H&P (Addendum)
Consultation Note Date: 08/11/2020   Patient Name: Patrick Phillips  DOB: 1940/09/26  MRN: [id number]  Age / Sex: 80 y.o., male  PCP: [name], MD Referring Physician: Md, Trauma, MD  Reason for Consultation: Establishing goals of care  HPI/Patient Profile: 80 y.o. male  with past medical history of vascular dementia, T2DM, HTN, multiple hospital admission with delirium, frequent falls, urinary retention s/p foley placement and COVID admitted on 08/09/2020 after a fall at his SNF. Found to have new right orbital fractures, as well as an acute right frontal SDH, and C spine fractures with possible ligamentous injury. His UA was concerning for a UTI and antibiotics were started. Agitation has been a problem throughout hospitalization requiring geodon administration. Family discussed and agreed to transition to comfort measures with physician. PMT consulted to assist with comfort measures and hospice transition.   Clinical Assessment and Goals of Care: I have reviewed medical records including EPIC notes, labs and imaging, received report from RN, assessed the patient and then met with his daughter Patrick Phillips  to discuss diagnosis prognosis, GOC, EOL wishes, disposition and options.  I introduced Palliative Medicine as specialized medical care for people living with serious illness. It focuses on providing relief from the symptoms and stress of a serious illness. The goal is to improve quality of life for both the patient and the family.  We discussed a brief life review of the patient. Patrick Phillips shares that patient had been doing very well - out working in the yard and helping his son build - until mid April 2022. Since that time he has declined but still had occasional good days.    We discussed patient's current illness and what it means in the larger context of patient's on-going co-morbidities.  Natural disease trajectory and expectations at EOL were  discussed. Patrick Phillips tells me this was explained to her by previous providers and she has a good understanding of the situation.   We discussed measures used to ensure Patrick Phillips comfort and Patrick Phillips expressed understanding.   Hospice services outpatient were explained and offered. Patrick Phillips would like to proceed with referral to residential hospice facility in [facility].   Questions and concerns were addressed. The family was encouraged to call with questions or concerns.    Primary Decision Maker NEXT OF KIN - children    SUMMARY OF RECOMMENDATIONS   - continue comfort measures - medication not needed to ensure comfort were discontinued - IV ativan added, dc IM  - TOC referral for [facility] hosp[facility] facility admission  Code Status/Advance Care Planning: DNR  Additional Recommendations (Limitations, Scope, Preferences): Full Comfort Care  Prognosis:  < 2 weeks  Discharge Planning: Hospice facility      Primary Diagnoses: Present on Admission:  Acute metabolic encephalopathy  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[place])  Vascular dementia ([place])  SDH (subdural hematoma) (HCC)  Cervical spinal cord injury, initial encounter ([place])  Hypertension  (Resolved) Acute lower UTI  Right orbital fracture, closed, initial encounter ([place])  Urinary tract infection associated with catheterization of urinary tract, initial encounter ([place])  Subdural hematoma ([place])   I have reviewed the medical record, interviewed the patient and family, and examined the patient. The following aspects are pertinent.  Past Medical History:  Diagnosis Date   Cancer ([place])    skin cancer   Dementia ([place])    Diabetes mellitus without complication ([place])    Hypertension    Memory deficit    Social History   Socioeconomic History   Marital  status: Married    Spouse name: Not on file   Number of children: Not on file   Years of education: Not on file   Highest education level: Not on file   Occupational History   Not on file  Tobacco Use   Smoking status: Never   Smokeless tobacco: Never  Vaping Use   Vaping Use: Never used  Substance and Sexual Activity   Alcohol use: Yes    Comment: occ   Drug use: No   Sexual activity: Not on file  Other Topics Concern   Not on file  Social History Narrative   Not on file   Social Determinants of Health   Financial Resource Strain: Not on file  Food Insecurity: Not on file  Transportation Needs: Not on file  Physical Activity: Not on file  Stress: Not on file  Social Connections: Not on file   Family History  Problem Relation Age of Onset   Cancer Mother    Cancer Father    Cancer Brother    Cancer Other    Scheduled Meds:  insulin aspart  0-9 Units Subcutaneous Q4H   Continuous Infusions:  sodium chloride 75 mL/hr at 08/10/20 2000   levETIRAcetam 500 mg (08/10/20 2158)   PRN Meds:.alum & mag hydroxide-simeth, hydrALAZINE, LORazepam, morphine injection, ondansetron **OR** ondansetron (ZOFRAN) IV, ziprasidone Allergies  Allergen Reactions   Aricept [Donepezil] Other (See Comments)    Per daughter, increased mood swings and anger- "Allergic," per MAR   Review of Systems  Unable to perform ROS: Mental status change   Physical Exam Constitutional:      General: He is not in acute distress.    Comments: Does not respond to voice or gentle touch  Pulmonary:     Effort: Pulmonary effort is normal. No respiratory distress.  Abdominal:     Palpations: Abdomen is soft.  Musculoskeletal:     Right lower leg: No edema.     Left lower leg: No edema.  Skin:    General: Skin is warm and dry.    Vital Signs: BP (!) 167/56 (BP Location: Left Arm)   Pulse 68   Temp 98.5 F (36.9 C) (Oral)   Resp 19   Ht 6' (1.829 m)   Wt 71.5 kg   SpO2 95%   BMI 21.38 kg/m  Pain Scale: PAINAD   Pain Score: 0-No pain   SpO2: SpO2: 95 % O2 Device:SpO2: 95 % O2 Flow Rate: .O2 Flow Rate (L/min): 2 L/min  IO:  Intake/output summary:  Intake/Output Summary (Last 24 hours) at 08/11/2020 1535 Last data filed at 08/11/2020 1300 Gross per 24 hour  Intake 650.55 ml  Output 1080 ml  Net -429.45 ml    LBM: Last BM Date:  (PTA) Baseline Weight: Weight: 71.5 kg Most recent weight: Weight: 71.5 kg     Palliative Assessment/Data: PPS 10%     Time Total: 60 minutes Greater than 50%  of this time was spent counseling and coordinating care related to the above assessment and plan.  Juel Burrow, DNP, AGNP-C Palliative Medicine Team [phone] Pager: [phone]

## 2020-08-12 DIAGNOSIS — Z7189 Other specified counseling: Secondary | ICD-10-CM | POA: Diagnosis not present

## 2020-08-12 DIAGNOSIS — Z515 Encounter for palliative care: Secondary | ICD-10-CM | POA: Diagnosis not present

## 2020-08-12 DIAGNOSIS — S14109A Unspecified injury at unspecified level of cervical spinal cord, initial encounter: Secondary | ICD-10-CM | POA: Diagnosis not present

## 2020-08-12 DIAGNOSIS — Z66 Do not resuscitate: Secondary | ICD-10-CM | POA: Diagnosis not present

## 2020-08-12 LAB — SARS CORONAVIRUS 2 (TAT 6-24 HRS): SARS Coronavirus 2: NEGATIVE

## 2020-08-12 MED ORDER — ACETAMINOPHEN 650 MG RE SUPP: 650.0000 mg | Freq: Four times a day (QID) | RECTAL | Status: DC | PRN

## 2020-08-12 MED ORDER — MORPHINE SULFATE (CONCENTRATE) 10 MG/0.5ML PO SOLN: 5.0000 mg | ORAL | Status: DC | PRN

## 2020-08-12 MED ORDER — POLYVINYL ALCOHOL 1.4 % OP SOLN
1.0000 [drp] | Freq: Four times a day (QID) | OPHTHALMIC | Status: DC | PRN
  Filled 2020-08-12: qty 15

## 2020-08-12 MED ORDER — ACETAMINOPHEN 325 MG PO TABS: 650.0000 mg | ORAL_TABLET | Freq: Four times a day (QID) | ORAL | Status: DC | PRN

## 2020-08-12 MED ORDER — HALOPERIDOL LACTATE 2 MG/ML PO CONC: 2.0000 mg | ORAL | 0 refills | Status: AC | PRN

## 2020-08-12 MED ORDER — LORAZEPAM 2 MG/ML PO CONC: 1.0000 mg | Freq: Four times a day (QID) | ORAL | 0 refills | Status: AC

## 2020-08-12 MED ORDER — LORAZEPAM 2 MG/ML PO CONC: 1.0000 mg | ORAL | 0 refills | Status: AC | PRN

## 2020-08-12 MED ORDER — LORAZEPAM 2 MG/ML PO CONC
1.0000 mg | Freq: Four times a day (QID) | ORAL | Status: DC
  Administered 2020-08-12: 1 mg via SUBLINGUAL
  Filled 2020-08-12: qty 1

## 2020-08-12 MED ORDER — LORAZEPAM 2 MG/ML PO CONC: 1.0000 mg | Freq: Four times a day (QID) | ORAL | Status: DC

## 2020-08-12 MED ORDER — LORAZEPAM 2 MG/ML PO CONC: 1.0000 mg | ORAL | Status: DC | PRN

## 2020-08-12 MED ORDER — HALOPERIDOL LACTATE 2 MG/ML PO CONC
2.0000 mg | ORAL | Status: DC | PRN
  Filled 2020-08-12 (×3): qty 1

## 2020-08-12 MED ORDER — POLYVINYL ALCOHOL 1.4 % OP SOLN: 1.0000 [drp] | Freq: Four times a day (QID) | OPHTHALMIC | 0 refills | Status: AC | PRN

## 2020-08-12 MED ORDER — HALOPERIDOL LACTATE 2 MG/ML PO CONC: 0.5000 mg | ORAL | Status: DC | PRN

## 2020-08-12 MED ORDER — GLYCOPYRROLATE 1 MG PO TABS
1.0000 mg | ORAL_TABLET | ORAL | Status: DC | PRN
  Filled 2020-08-12: qty 1

## 2020-08-12 MED ORDER — BIOTENE DRY MOUTH MT LIQD
15.0000 mL | Freq: Two times a day (BID) | OROMUCOSAL | Status: DC
  Administered 2020-08-12: 15 mL via TOPICAL

## 2020-08-12 MED ORDER — HALOPERIDOL 0.5 MG PO TABS: 0.5000 mg | ORAL_TABLET | ORAL | Status: DC | PRN

## 2020-08-12 MED ORDER — MORPHINE SULFATE (CONCENTRATE) 10 MG/0.5ML PO SOLN: 5.0000 mg | ORAL | 0 refills | Status: AC | PRN

## 2020-08-12 MED ORDER — GLYCOPYRROLATE 0.2 MG/ML IJ SOLN: 0.2000 mg | INTRAMUSCULAR | Status: DC | PRN

## 2020-08-12 MED ORDER — BIOTENE DRY MOUTH MT LIQD: 15.0000 mL | Freq: Two times a day (BID) | OROMUCOSAL | Status: AC

## 2020-08-12 MED ORDER — HALOPERIDOL LACTATE 5 MG/ML IJ SOLN
2.0000 mg | INTRAMUSCULAR | Status: DC | PRN
  Filled 2020-08-12: qty 0.4

## 2020-08-12 MED ORDER — HALOPERIDOL LACTATE 5 MG/ML IJ SOLN: 0.5000 mg | INTRAMUSCULAR | Status: DC | PRN

## 2020-08-12 MED ORDER — ONDANSETRON 4 MG PO TBDP: 4.0000 mg | ORAL_TABLET | Freq: Four times a day (QID) | ORAL | 0 refills | Status: AC | PRN

## 2020-08-12 NOTE — Progress Notes (Signed)
Patient discharged to [facility] as ordered, family at bedside, report was given to nurse Jenny Reichmann and patient was transported via [place].

## 2020-08-12 NOTE — Discharge Summary (Signed)
Patient ID: Patrick Phillips [id number] 17-Apr-1940 80 y.o.  Admit date: 08/09/2020 Discharge date: 08/12/2020  Admitting Diagnosis: Suspected UTI after a fall from standing. SDH, C spine injuries Facial fractures Agitation UTI HTN  Discharge Diagnosis Patient Active Problem List   Diagnosis Date Noted   Goals of care, counseling/discussion    Palliative care by specialist    DNR (do not resuscitate)    Comfort measures only status    Pressure injury of skin 08/10/2020   SDH (subdural hematoma) ([place]) 08/09/2020   Cervical spinal cord injury, initial encounter ([place]) 08/09/2020   Right orbital fracture, closed, initial encounter ([place]) 08/09/2020   Fracture of orbit, left, closed, with routine healing, subsequent encounter 08/09/2020   Urinary tract infection associated with catheterization of urinary tract, initial encounter ([place]) 08/09/2020   Subdural hematoma ([place]) 08/09/2020   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[place]) 07/24/2020   Acute colitis 07/21/2020   Volume depletion 07/21/2020   Normocytic anemia 07/21/2020   Malnutrition of moderate degree 08/27/1599   Acute metabolic encephalopathy 09/32/3557   COVID-19 virus infection 06/11/2020   Vascular dementia ([place]) 06/11/2020   Acute ischemic stroke ([place]) 07/24/2017   Chest pain 12/22/2015   Chronic back pain 12/22/2015   Syncope 10/17/2013   Sinus bradycardia 10/17/2013   SOB (shortness of breath) 10/17/2013   Hypertension    Type 2 diabetes mellitus (HCC)   FFS SDH C4 SP fx  Cervical spine ligamentous injury Facial fractures Agitation  Hx DM2  UTI  HTN  DNR Comfort Care/Hospice  Consultants Palliative Care ENT NSGY TRH  H&P: Patrick Phillips is a 80 yo male with vascular dementia who presented to the ED today after a fall at a SNF. His daughter reports that for the last 2 months he has had multiple admissions and a recent UTI, and currently has a foley in place for urinary retention. She says he often has  agitation when he has an infection. The last few days he was more lucid, but today had a fall from standing. He had a fall earlier this month during which sustained left orbital fractures. His workup in the ED today showed new right orbital fractures, as well as an acute right frontal SDH, and C spine fractures with possible ligamentous injury. His UA was concerning for a UTI and antibiotics have been started. Trauma has been consulted for admission due to multisystem injuries. Neurosurgery has already evaluated the patient and is recommending continued C spine immobilization for now.   The patient has been agitated in the ED requiring 2 doses of Geodon. He is on ativan as an outpatient, and per other notes haldol has not been very effective for him in the past. At the time of my exam he was disoriented and mildly agitated but easily redirected. His daughter says that earlier he reported lower abdominal pain and back pain.  Procedures None  Hospital Course:  Patient presented as above and was found to have below injuries. After admission, CT C/A/P was also performed and negative for traumatic injury. Below is his hospital course   Fall from standing   SDH - NSGY c/s, Dr. Ellene Route. Recommended no acute neurosurgical intervention. Leveda Anna x7d for sz ppx. No f/u CT recommended.   C4 SP fx and ligamentous injury - NSGY c/s, Dr. Ellene Route. MRI obtained. See his note on 6/14. In light of transitioning to comfort care/hospice was okay for soft collar or d/c collar.  Facial fractures - ENT c/s, Dr. Conley Simmonds, recs for sinus precautions  x2w   Agitation - had good response to geodon x2. Placed on Geodon prn  Hx DM2 - TRH consulted.   UTI - Was placed on Cefepime empirically. Stopped 6/15 after transitioned to comfort care/hospice. Continue foley for urinary retention - flush this morning.   HTN - prn antihypertensives   Please see note from Dr. Bobbye Morton on 6/14 for [place] discussion with family. Patient was  transitioned to comfort care. Palliative was consulted and family made the decision for hospice. On 6/16 patient was discharged to [facility]    Allergies as of 08/12/2020       Reactions   Aricept [donepezil] Other (See Comments)   Per daughter, increased mood swings and anger- "Allergic," per [facility]        Medication List     STOP taking these medications    cephALEXin 500 MG capsule Commonly known as: Keflex   esomeprazole 40 MG capsule Commonly known as: NEXIUM   LORazepam 2 MG/ML injection Commonly known as: ATIVAN Replaced by: LORazepam 2 MG/ML concentrated solution       TAKE these medications - home meds resumed. Discharge medication recs per palliative   acetaminophen 500 MG tablet Commonly known as: TYLENOL Take 500 mg by mouth every 6 (six) hours as needed for headache, fever or mild pain.   antiseptic oral rinse Liqd Apply 15 mLs topically 2 (two) times daily.   aspirin EC 81 MG tablet Take 1 tablet (81 mg total) by mouth daily.   carvedilol 6.25 MG tablet Commonly known as: COREG Take 1 tablet (6.25 mg total) by mouth 2 (two) times daily with a meal.   escitalopram 10 MG tablet Commonly known as: LEXAPRO Take 1 tablet (10 mg total) by mouth daily.   haloperidol 2 MG/ML solution Commonly known as: HALDOL Place 1 mL (2 mg total) under the tongue every 4 (four) hours as needed for agitation (or delirium).   hydrALAZINE 100 MG tablet Commonly known as: APRESOLINE Take 1 tablet (100 mg total) by mouth every 8 (eight) hours.   LORazepam 2 MG/ML concentrated solution Commonly known as: ATIVAN Place 0.5 mLs (1 mg total) under the tongue every 4 (four) hours as needed for seizure or anxiety. Replaces: LORazepam 2 MG/ML injection   LORazepam 2 MG/ML concentrated solution Commonly known as: ATIVAN Place 0.5 mLs (1 mg total) under the tongue every 6 (six) hours.   MAALOX PLUS EXTRA STRENGTH PO Take 30 mLs by mouth every 6 (six) hours as  needed (for heartburn or indigestion). What changed: Another medication with the same name was removed. Continue taking this medication, and follow the directions you see here.   metFORMIN 1000 MG tablet Commonly known as: GLUCOPHAGE Take 1 tablet (1,000 mg total) by mouth 2 (two) times daily.   morphine CONCENTRATE 10 MG/0.5ML Soln concentrated solution Place 0.25 mLs (5 mg total) under the tongue every hour as needed for severe pain.   multivitamin with minerals Tabs tablet Take 1 tablet by mouth daily.   neomycin-bacitracin-polymyxin [phone] ointment Apply 1 application topically daily as needed (minor skin tears/abrasions).   omeprazole 20 MG capsule Commonly known as: PRILOSEC Take 20 mg by mouth at bedtime.   ondansetron 4 MG disintegrating tablet Commonly known as: ZOFRAN-ODT Take 1 tablet (4 mg total) by mouth every 6 (six) hours as needed for nausea.   polyvinyl alcohol 1.4 % ophthalmic solution Commonly known as: LIQUIFILM TEARS Place 1 drop into both eyes 4 (four) times daily as needed for dry  eyes.   QUEtiapine 50 MG tablet Commonly known as: SEROQUEL Take 1 tablet (50 mg total) by mouth daily at 8 pm.   tamsulosin 0.4 MG Caps capsule Commonly known as: FLOMAX Take 1 capsule (0.4 mg total) by mouth daily.   thiamine 100 MG tablet Take 1 tablet (100 mg total) by mouth daily.       ASK your doctor about these medications    feeding supplement Liqd Take 237 mLs by mouth 2 (two) times daily between meals.          Follow-up Information     [name], MD Follow up.   Specialty: Family Medicine Why: As needed Contact information: [place] [address] [phone]         [place] Follow up.   Why: As needed Contact information: [place] [address] [phone]        Kristeen Miss, MD Follow up.   Specialty: Neurosurgery Why: As needed Contact  information: [address] [place] [address] [phone]         Newt Lukes, DMD Follow up.   Specialty: Oral Surgery Why: As needed Contact information: [place] [address] [phone]                 Signed: Alferd Apa, [facility]: [facility] 08/12/2020, 10:06 AM Please see Amion for pager number during day hours 7:00am-4:30pm

## 2020-08-12 NOTE — Care Management Important Message (Signed)
Important Message  Patient Details  Name: Patrick Phillips MRN: [id number] Date of Birth: 1940/05/07   Medicare Important Message Given:  Yes   Patient is at End of Life out of respect no IM given.  Will mail to the patient home address.   Zoila Ditullio 08/12/2020, 2:56 PM

## 2020-08-12 NOTE — TOC Transition Note (Signed)
Transition of Care [facility]) - CM/SW Discharge Note   Patient Details  Name: Patrick Phillips MRN: [id number] Date of Birth: March 20, 1940  Transition of [facility]) CM/SW Contact:  Ella Bodo, RN Phone Number: 08/12/2020, 11:35 AM   Clinical Narrative:  Pt medically stable for transport via [facility]; ambulance service called for transport.      Final next level of care: [place] Barriers to Discharge: Barriers Resolved   Patient Goals and CMS Choice   CMS Medicare.gov Compare Post Acute Care list provided to::  (daughter [name]) Choice offered to / list presented to : Adult Children  Discharge Placement              Patient chooses bed at: Other - please specify in the comment section below: ([facility]) Patient to be transferred to facility by: [facility]; called for transport at 11:30am Name of family member notified: Daughter, Vladimir Creeks Patient and family notified of of transfer: 08/12/20  Discharge Plan and Services   Discharge Planning Services: CM Consult Post Acute Care Choice: Hospice                               Social Determinants of Health (SDOH) Interventions     Readmission Risk Interventions No flowsheet data found.  Reinaldo Raddle, RN, BSN  Trauma/Neuro ICU Case Manager [phone]

## 2020-08-12 NOTE — Progress Notes (Signed)
Daily Progress Note   Patient Name: Patrick Phillips       Date: 08/12/2020 DOB: Feb 19, 1941  Age: 80 y.o. MRN#: [id number] Attending Physician: Particia Jasper, MD Primary Care Physician: [name], MD Admit Date: 08/09/2020  Reason for Consultation/Follow-up: Establishing goals of care and Terminal Care  Subjective: Patient does not wake to voice or touch, son at bedside, some apnea noted  Length of Stay: 3  Current Medications: Scheduled Meds:  . antiseptic oral rinse  15 mL Topical BID    Continuous Infusions: . levETIRAcetam 500 mg (08/10/20 2158)    PRN Meds: acetaminophen **OR** acetaminophen, glycopyrrolate **OR** glycopyrrolate **OR** glycopyrrolate, [DISCONTINUED] haloperidol **OR** haloperidol **OR** haloperidol lactate, LORazepam, morphine injection, ondansetron **OR** ondansetron (ZOFRAN) IV, polyvinyl alcohol  Physical Exam Constitutional:      Comments: unresponsive  Pulmonary:     Comments: Periods of apnea Skin:    General: Skin is warm and dry.            Vital Signs: BP (!) 175/70 (BP Location: Left Arm)   Pulse (!) 111   Temp 99.3 F (37.4 C) (Oral)   Resp 18   Ht 6' (1.829 m)   Wt 71.5 kg   SpO2 91%   BMI 21.38 kg/m  SpO2: SpO2: 91 % O2 Device: O2 Device: Nasal Cannula O2 Flow Rate: O2 Flow Rate (L/min): 2 L/min  Intake/output summary:  Intake/Output Summary (Last 24 hours) at 08/12/2020 0847 Last data filed at 08/12/2020 7026 Gross per 24 hour  Intake 0 ml  Output 980 ml  Net -980 ml   LBM: Last BM Date:  (PTA) Baseline Weight: Weight: 71.5 kg Most recent weight: Weight: 71.5 kg       Palliative Assessment/Data: PPS 10%    Flowsheet Rows    Flowsheet Row Most Recent Value  Intake Tab   Referral Department Trauma  Unit at Time of Referral ICU   Palliative Care Primary Diagnosis Trauma  Date Notified 08/10/20  Palliative Care Type New Palliative care  Reason for referral Clarify Goals of Care  Date of Admission 08/09/20  Date first seen by Palliative Care 08/11/20  # of days Palliative referral response time 1 Day(s)  # of days IP prior to Palliative referral 1  Clinical Assessment   Psychosocial & Spiritual Assessment   Palliative Care Outcomes        Patient Active Problem List   Diagnosis Date Noted  . Goals of care, counseling/discussion   . Palliative care by specialist   . DNR (do not resuscitate)   . Comfort measures only status   . Pressure injury of skin 08/10/2020  . SDH (subdural hematoma) ([place]) 08/09/2020  . Cervical spinal cord injury, initial encounter ([place]) 08/09/2020  . Right orbital fracture, closed, initial encounter ([place]) 08/09/2020  . Fracture of orbit, left, closed, with routine healing, subsequent encounter 08/09/2020  . Urinary tract infection associated with catheterization of urinary tract, initial encounter ([place]) 08/09/2020  . Subdural hematoma ([place]) 08/09/2020  . 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 Acute colitis 07/21/2020  . Volume depletion 07/21/2020  . Normocytic anemia 07/21/2020  . Malnutrition of moderate degree 06/30/2020  . Acute metabolic encephalopathy 37/85/8850  .  COVID-19 virus infection 06/11/2020  . Vascular dementia ([place]) 06/11/2020  . Acute ischemic stroke ([place]) 07/24/2017  . Chest pain 12/22/2015  . Chronic back pain 12/22/2015  . Syncope 10/17/2013  . Sinus bradycardia 10/17/2013  . SOB (shortness of breath) 10/17/2013  . Hypertension   . Type 2 diabetes mellitus ([place])     Palliative Care Assessment & Plan   HPI: 80 y.o. male  with past medical history of vascular dementia, T2DM, HTN, multiple hospital admission with delirium, frequent falls, urinary retention s/p foley placement and COVID admitted on 08/09/2020 after a fall at his SNF.  Found to have new right orbital fractures, as well as an acute right frontal SDH, and C spine fractures with possible ligamentous injury. His UA was concerning for a UTI and antibiotics were started. Agitation has been a problem throughout hospitalization requiring geodon administration. Family discussed and agreed to transition to comfort measures with physician. PMT consulted to assist with comfort measures and hospice transition.   Assessment: Patient appears comfortable this AM.  Son at bedside - only concern is some periods of apnea overnight.  Discussed risk of transferring to hospice facility in [facility] - discussed possibility of death during transfer. Son is accepting of this risk and still hopeful to get patient to hospice facility - this would be better for family.  Spoke with RN - patient still has orders for CBGs and fluids - will dc.   Recommendations/Plan: Continue full comfort measures Family hopeful for dc to [facility] hospice facility today  Goals of Care and Additional Recommendations: Limitations on Scope of Treatment: Full Comfort Care  Code Status: DNR  Prognosis:  Hours - Days  Discharge Planning: Hospice facility  Care plan was discussed with patient's son and RN  Thank you for allowing the Palliative Medicine Team to assist in the care of this patient.   Total Time 25 minutes Prolonged Time Billed  no       Greater than 50%  of this time was spent counseling and coordinating care related to the above assessment and plan.  Juel Burrow, DNP, [facility] Palliative Medicine Team Team Phone # [phone]  Pager [phone]

## 2020-08-12 NOTE — TOC Progression Note (Signed)
Transition of Care [facility]) - Progression Note    Patient Details  Name: Patrick Phillips MRN: [id number] Date of Birth: January 18, 1941  Transition of Care [facility]) CM/SW Contact  Oren Section Cleta Alberts, RN Phone Number: 08/12/2020, 9:53 AM  Clinical Narrative:  Per Cassandra with [facility], patient has bed available today at residential hospice facility.  Notified PA; bedside nurse to call report to [phone].       Expected Discharge Plan: [place] Barriers to Discharge: Continued Medical Work up  Expected Discharge Plan and Services Expected Discharge Plan: [place]   Discharge Planning Services: CM Consult Post Acute Care Choice: Hospice Living arrangements for the past 2 months: [place]                                       Social Determinants of Health (SDOH) Interventions    Readmission Risk Interventions No flowsheet data found.  Reinaldo Raddle, RN, BSN  Trauma/Neuro ICU Case Manager [phone]

## 2020-08-12 NOTE — Progress Notes (Signed)
Subjective: CC: Resting comfortably this morning. Palliative met with family yesterday and decision was made to transition to hospice. They are awaiting residential hospice facility in [facility]  Objective: Vital signs in last 24 hours: Temp:  [99.3 F (37.4 C)] 99.3 F (37.4 C) (06/16 0618) Pulse Rate:  [111] 111 (06/16 0618) Resp:  [18] 18 (06/16 0618) BP: (175)/(70) 175/70 (06/16 0618) SpO2:  [91 %] 91 % (06/16 0618) Last BM Date:  (PTA)  Intake/Output from previous day: 06/15 0701 - 06/16 0700 In: 0  Out: 980 [Urine:980] Intake/Output this shift: No intake/output data recorded.  PE: Gen: comfortable, no distress Neuro: not following commands CV: Tachycardic Pulm: CTA b/l with normal rate Abd: Soft, NT, ND, +BS GU: Foley with bloody urine with sediment - I have asked the RN to flush Extr: wwp, no edema  Lab Results:  Recent Labs    08/09/20 1105 08/10/20 0153  WBC 11.7* 11.1*  HGB 10.9* 11.7*  HCT 33.4* 35.7*  PLT 302 297   BMET Recent Labs    08/09/20 1105 08/10/20 0153  NA 134* 136  K 4.0 3.5  CL 99 102  CO2 26 26  GLUCOSE 169* 97  BUN 25* 15  CREATININE 0.96 0.89  CALCIUM 9.0 8.9   PT/INR Recent Labs    08/09/20 1645  LABPROT 13.7  INR 1.0   CMP     Component Value Date/Time   NA 136 08/10/2020 0153   K 3.5 08/10/2020 0153   CL 102 08/10/2020 0153   CO2 26 08/10/2020 0153   GLUCOSE 97 08/10/2020 0153   BUN 15 08/10/2020 0153   CREATININE 0.89 08/10/2020 0153   CALCIUM 8.9 08/10/2020 0153   PROT 5.5 (L) 08/09/2020 1105   ALBUMIN 2.9 (L) 08/09/2020 1105   AST 28 08/09/2020 1105   ALT 28 08/09/2020 1105   ALKPHOS 43 08/09/2020 1105   BILITOT 0.8 08/09/2020 1105   GFRNONAA >60 08/10/2020 0153   GFRAA >60 05/20/2018 1419   Lipase     Component Value Date/Time   LIPASE 39 06/10/2020 1055       Studies/Results: No results found.  Anti-infectives: Anti-infectives (From admission, onward)    Start      Dose/Rate Route Frequency Ordered Stop   08/10/20 1600  cefTRIAXone (ROCEPHIN) 1 g in sodium chloride 0.9 % 100 mL IVPB  Status:  Discontinued        1 g 200 mL/hr over 30 Minutes Intravenous Every 24 hours 08/09/20 2140 08/09/20 2229   08/10/20 0000  ceFEPIme (MAXIPIME) 2 g in sodium chloride 0.9 % 100 mL IVPB  Status:  Discontinued        2 g 200 mL/hr over 30 Minutes Intravenous Every 8 hours 08/09/20 2249 08/11/20 1303   08/09/20 1615  cefTRIAXone (ROCEPHIN) 1 g in sodium chloride 0.9 % 100 mL IVPB        1 g 200 mL/hr over 30 Minutes Intravenous  Once 08/09/20 1610 08/09/20 1725        Assessment/Plan FFS SDH - NSGY c/s, Dr. Ellene Route, keppra x7d for sz ppx, no f/u CT recommended C4 SP fx - NSGY c/s, Dr. Ellene Route, per note on 6/14 in light of transitioning to comfort care/hospice - okay for soft collar or d/c collar. Facial fractures - ENT c/s, Dr. Conley Simmonds, recs for sinus precautions x2w Agitation - good response to geodon x2, on prn Hx DM2 - CBG's <150. D/c CBG's in light of transitioning to  hospice  UTI - Was previously on abx. Stopped 6/15 as noted above. Continue foley for urinary retention - flush this morning.  HTN - prn antihypertensives  DNR FEN - CLD and okay to have ice cream DVT - SCDs Dispo - Palliative met with family yesterday and decision was made to transition to hospice. They are awaiting residential hospice facility in [facility]. Son was at bedside this am.    LOS: 3 days    Jillyn Ledger , [facility] Surgery 08/12/2020, 8:37 AM Please see Amion for pager number during day hours 7:00am-4:30pm

## 2020-08-27 DEATH — deceased

## 2021-10-13 IMAGING — CT CT ABD-PELV W/O CM
2 of 4 series · 15 of 46 positions shown, 17 images · non-contrast
Comparison: CT 10/30/2017

CLINICAL DATA: Abdominal distension.  Blood in stool.

EXAM:
CT ABDOMEN AND PELVIS WITHOUT CONTRAST
TECHNIQUE: Multidetector CT imaging of the abdomen and pelvis was performed
following the standard protocol without IV contrast.

[Series 3: a/p w/o 5mm · axial · non-contrast · 0.86mm/px · z∈[+701,+1191]mm · 12 of 108 slices shown, 14 images]
[im 5/108  soft-tissue]
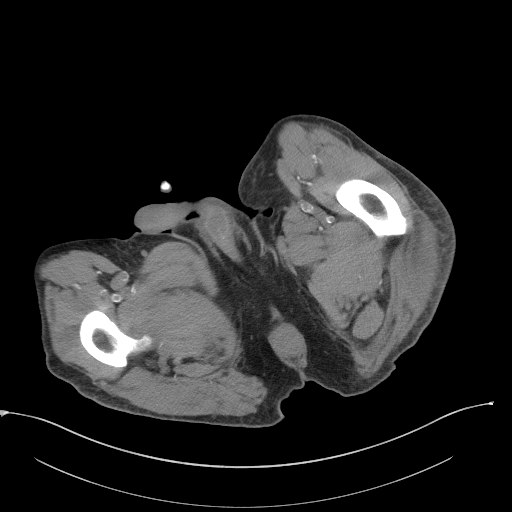
[im 5/108  bone]
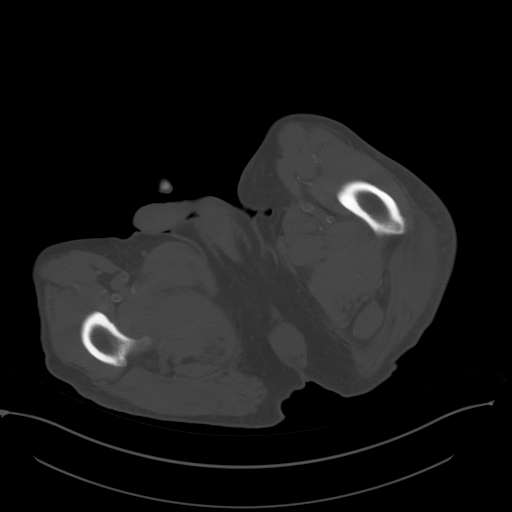
[im 14/108  soft-tissue]
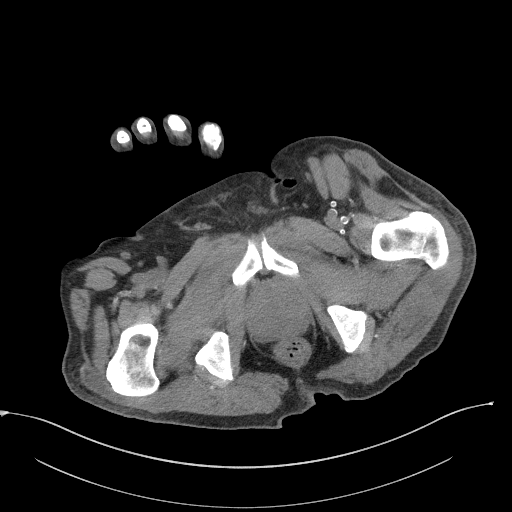
[im 23/108  soft-tissue]
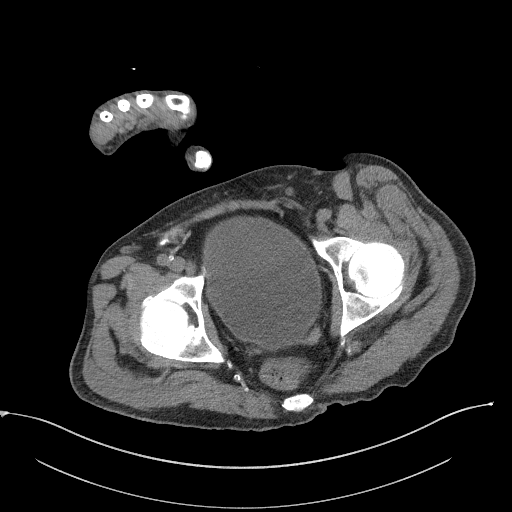
[im 32/108  soft-tissue]
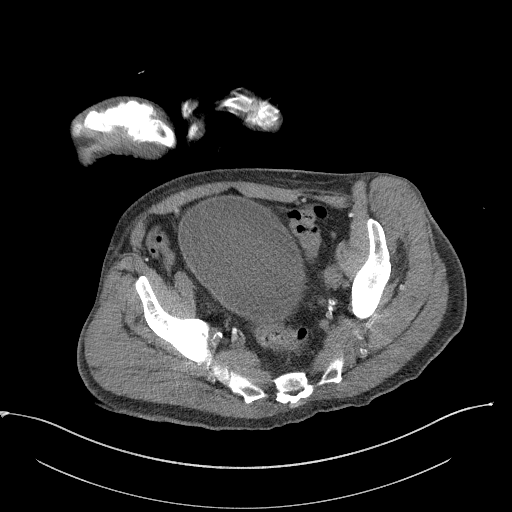
[im 41/108  soft-tissue]
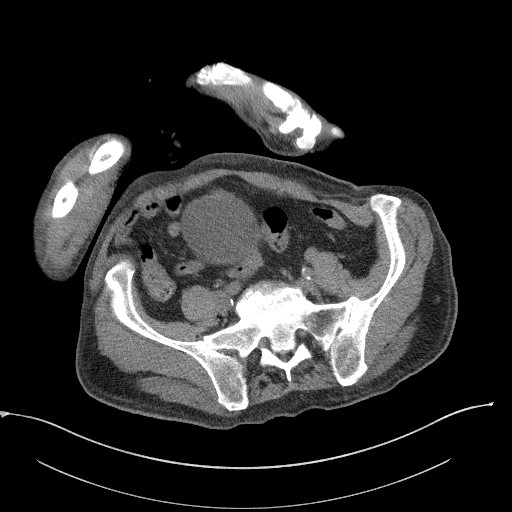
[im 50/108  soft-tissue]
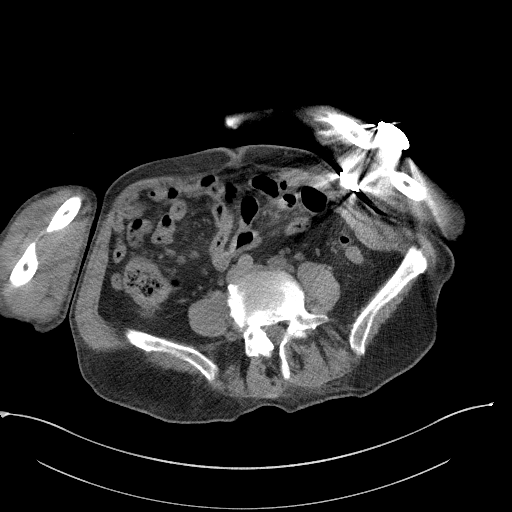
[im 58/108  soft-tissue]
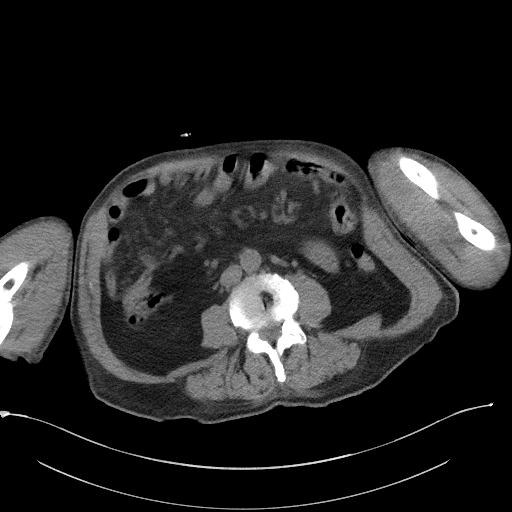
[im 67/108  soft-tissue]
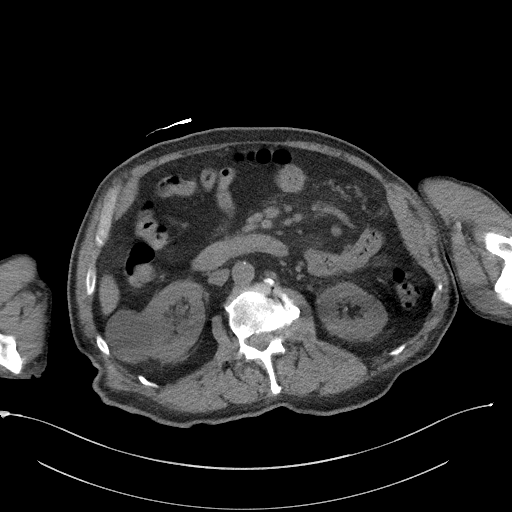
[im 76/108  soft-tissue]
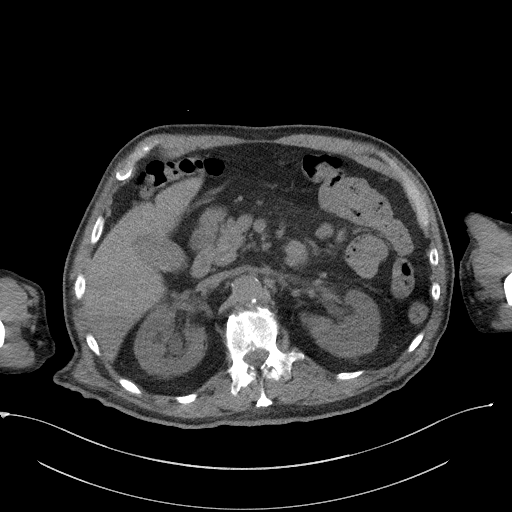
[im 76/108  bone]
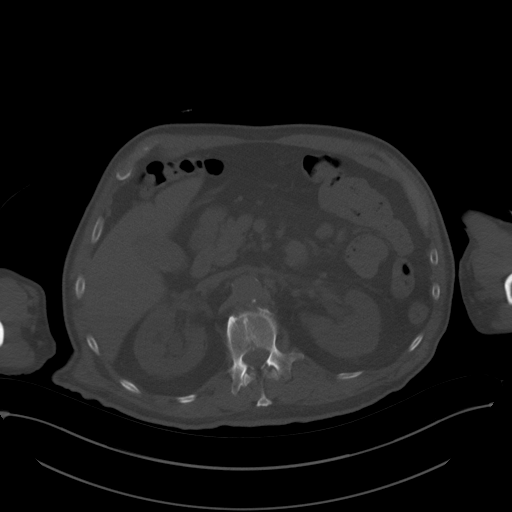
[im 85/108  soft-tissue]
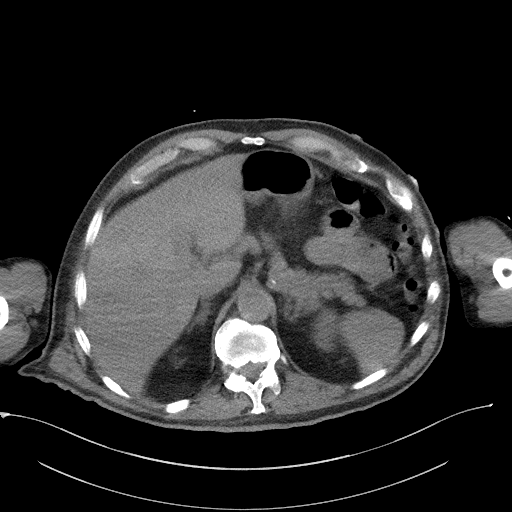
[im 94/108  soft-tissue]
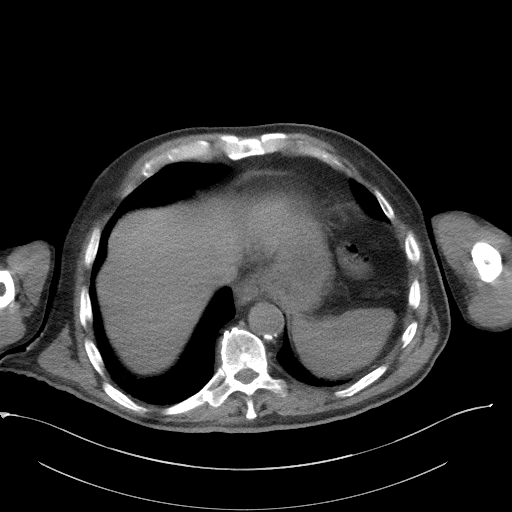
[im 103/108  soft-tissue]
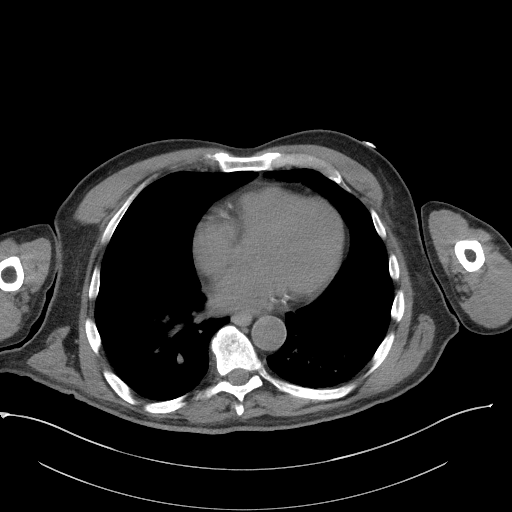

[Series 6: a/p w/o cor · coronal · non-contrast · 0.80mm/px · 3 of 149 slices shown]
[im 50/149  soft-tissue]
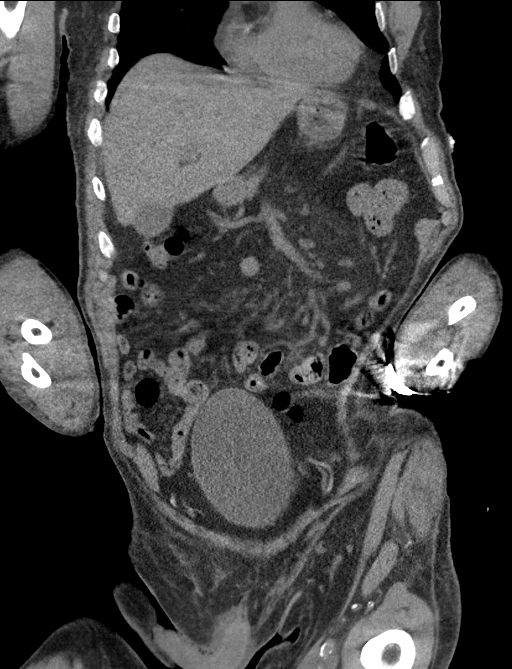
[im 66/149  soft-tissue]
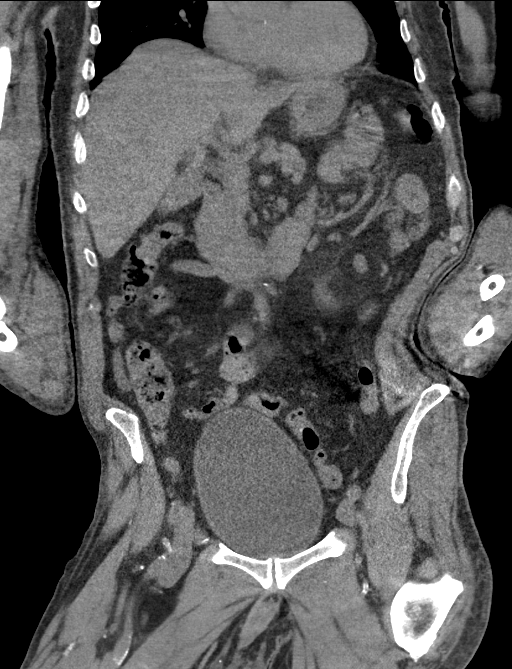
[im 83/149  soft-tissue]
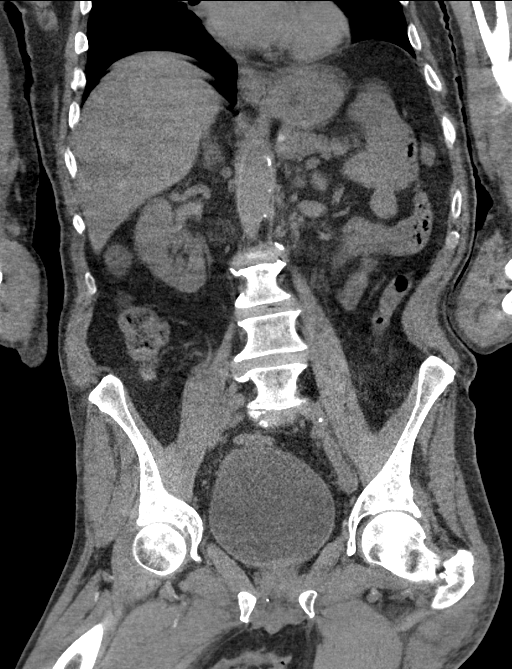

[15 of 46 positions shown; findings below may reference images not displayed]

FINDINGS: Lower chest: Small right lower lobe pulmonary nodules are unchanged
from prior exam, benign. No acute airspace disease or pleural
effusion. Stable heart size with coronary artery calcifications.

Hepatobiliary: No focal liver abnormality is seen. No gallstones,
gallbladder wall thickening, or biliary dilatation.

Pancreas: No ductal dilatation or inflammation.

Spleen: Normal in size without focal abnormality.

Adrenals/Urinary Tract: Normal adrenal glands. Prominence of both
renal collecting systems is felt to be due to degree of bladder
distension. No evidence of renal or ureteral calculi. Bladder is
distended with volume of 13.2 x 9.5 x 16 cm (volume = 4411 cm^3).
Minimal wall thickening about the bladder dome. Possible bladder
dome diverticulum. Cysts arising from the mid lower right kidney.
Small hyperdense lesion in the upper left kidney is incompletely
characterized.

Stomach/Bowel: Small hiatal hernia. No small bowel obstruction or
inflammation. Small volume of formed stool in the ascending and
proximal transverse colon. More distal colon is decompressed which
limits assessment for wall thickening. There may be mild sigmoid
wall thickening and edema. Appendix not well visualized on the
current exam, no appendicitis.

Vascular/Lymphatic: Aortic atherosclerosis without aneurysm.
Prominent retroperitoneal lymph nodes are similar to prior exam.
Prominent central mesenteric nodes measuring up to 15 mm, series 3,
image 36, also similar to prior exam. Mild chronic misty mesentery
in the left upper quadrant.

Reproductive: Enlarged prostate gland causing mass effect on the
bladder base. Prostate spans 5.3 cm transverse.

Other: No free air or ascites. Previous left inguinal hernia is no
longer seen.

Musculoskeletal: Degenerative change throughout the lumbar spine.
There are no acute or suspicious osseous abnormalities.
IMPRESSION: 1. Possible sigmoid colitis with mild wall thickening and edema.
2. Enlarged prostate gland causing mass effect on the bladder base.
3. Distended urinary bladder with mild wall thickening. Prominence
of both renal collecting systems is felt to be due to degree of
bladder distension.
4. Chronic misty mesentery with stable left mesenteric adenopathy

Aortic Atherosclerosis ([id number]).

## 2021-10-14 IMAGING — DX DG CHEST 1V PORT
1 series · 1 of 1 positions shown · non-contrast
Comparison: Chest x-ray 07/04/2020, CT chest 05/01/2018

CLINICAL DATA: Shortness of breath and [id number]

EXAM:
PORTABLE CHEST 1 VIEW

[chest]
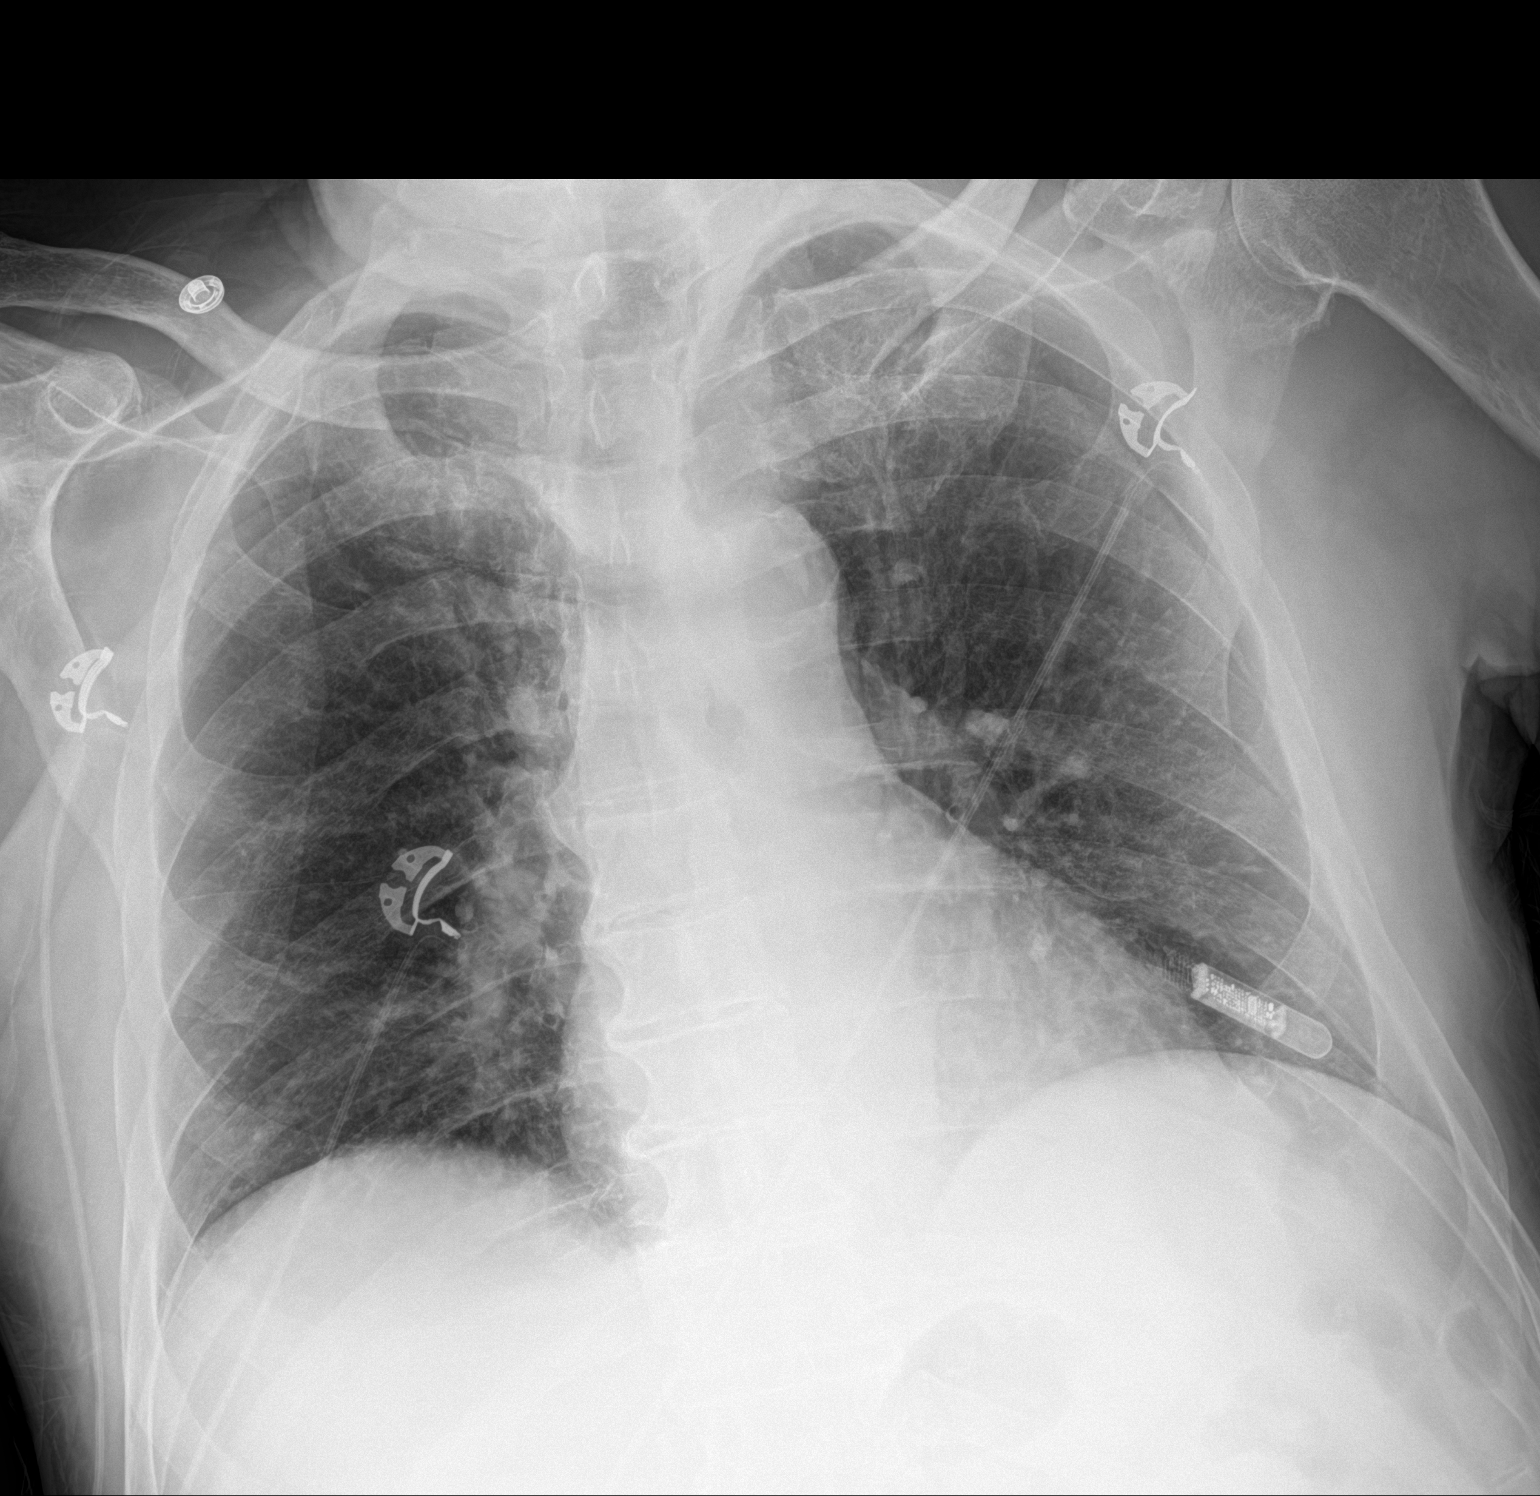

[1 of 1 positions shown; findings below may reference images not displayed]

FINDINGS: The heart size and mediastinal contours are unchanged. Aortic
calcifications. Wireless cardiac device overlies the left chest.

No focal consolidation. No pulmonary edema. No pleural effusion. No
pneumothorax.

No acute osseous abnormality.
IMPRESSION: No active disease.
